# Patient Record
Sex: Male | Born: 1960
Health system: Southern US, Community
[De-identification: ages and names within clinical notes are randomized; demographics above are authoritative.]

## PROBLEM LIST (undated history)

## (undated) DIAGNOSIS — R0609 Other forms of dyspnea: Secondary | ICD-10-CM

## (undated) DIAGNOSIS — L97519 Non-pressure chronic ulcer of other part of right foot with unspecified severity: Secondary | ICD-10-CM

## (undated) DIAGNOSIS — E11621 Type 2 diabetes mellitus with foot ulcer: Secondary | ICD-10-CM

## (undated) DIAGNOSIS — I739 Peripheral vascular disease, unspecified: Secondary | ICD-10-CM

## (undated) DIAGNOSIS — N12 Tubulo-interstitial nephritis, not specified as acute or chronic: Secondary | ICD-10-CM

## (undated) DIAGNOSIS — Z8619 Personal history of other infectious and parasitic diseases: Secondary | ICD-10-CM

## (undated) DIAGNOSIS — E119 Type 2 diabetes mellitus without complications: Secondary | ICD-10-CM

## (undated) DIAGNOSIS — Z9989 Dependence on other enabling machines and devices: Secondary | ICD-10-CM

## (undated) DIAGNOSIS — I8390 Asymptomatic varicose veins of unspecified lower extremity: Secondary | ICD-10-CM

## (undated) DIAGNOSIS — K760 Fatty (change of) liver, not elsewhere classified: Secondary | ICD-10-CM

## (undated) DIAGNOSIS — I2699 Other pulmonary embolism without acute cor pulmonale: Secondary | ICD-10-CM

## (undated) DIAGNOSIS — G4733 Obstructive sleep apnea (adult) (pediatric): Secondary | ICD-10-CM

## (undated) DIAGNOSIS — I82409 Acute embolism and thrombosis of unspecified deep veins of unspecified lower extremity: Secondary | ICD-10-CM

## (undated) DIAGNOSIS — I10 Essential (primary) hypertension: Secondary | ICD-10-CM

## (undated) DIAGNOSIS — I2510 Atherosclerotic heart disease of native coronary artery without angina pectoris: Secondary | ICD-10-CM

## (undated) DIAGNOSIS — K56609 Unspecified intestinal obstruction, unspecified as to partial versus complete obstruction: Secondary | ICD-10-CM

## (undated) DIAGNOSIS — E669 Obesity, unspecified: Secondary | ICD-10-CM

## (undated) HISTORY — DX: Asymptomatic varicose veins of unspecified lower extremity: I83.90

## (undated) HISTORY — DX: Type 2 diabetes mellitus without complications: E11.9

## (undated) HISTORY — DX: Atherosclerotic heart disease of native coronary artery without angina pectoris: I25.10

## (undated) HISTORY — DX: Personal history of other infectious and parasitic diseases: Z86.19

## (undated) HISTORY — PX: HERNIA REPAIR: SHX51

## (undated) NOTE — *Deleted (*Deleted)
Please stop by lab before you go If you have mychart- we will send your results within 3 business days of [place] receiving them.  If you do not have mychart- we will call you about results within 5 business days of [place] receiving them.  *please note we are currently using [facility] which has a longer processing time than [facility] typically so labs may not come back as quickly as in the past *please also note that you will see labs on mychart as soon as they post. I will later go in and write notes on them- will say "notes from Dr. Durene Cal"  Health Maintenance Due  Topic Date Due  . COLONOSCOPY  Never done  . OPHTHALMOLOGY EXAM  04/26/2014  . COVID-19 Vaccine (2 - Pfizer 2-dose series) 11/14/2019  . INFLUENZA VACCINE  12/26/2019   Depression screen [facility] 2/9 12/27/2019 12/09/2019 02/09/2018  Decreased Interest 0 0 2  Down, Depressed, Hopeless 0 0 2  PHQ - 2 Score 0 0 4  Altered sleeping 0 0 2  Tired, decreased energy 0 2 2  Change in appetite 0 0 1  Feeling bad or failure about yourself  0 0 2  Trouble concentrating 0 0 1  Moving slowly or fidgety/restless 0 0 0  Suicidal thoughts 0 0 0  PHQ-9 Score 0 2 12  Difficult doing work/chores Not difficult at all Not difficult at all Not difficult at all

---

## 2009-10-12 ENCOUNTER — Emergency Department (HOSPITAL_COMMUNITY): Admission: EM | Admit: 2009-10-12 | Discharge: 2009-10-12 | Payer: Self-pay | Admitting: Family Medicine

## 2010-08-13 LAB — CBC
HCT: 42.8 % (ref 39.0–52.0)
Hemoglobin: 14.8 g/dL (ref 13.0–17.0)
MCHC: 34.7 g/dL (ref 30.0–36.0)
MCV: 88.2 fL (ref 78.0–100.0)
Platelets: 256 10*3/uL (ref 150–400)
RBC: 4.86 MIL/uL (ref 4.22–5.81)
RDW: 13.8 % (ref 11.5–15.5)
WBC: 13.9 10*3/uL — ABNORMAL HIGH (ref 4.0–10.5)

## 2010-08-13 LAB — DIFFERENTIAL
Basophils Absolute: 0.1 10*3/uL (ref 0.0–0.1)
Basophils Relative: 1 % (ref 0–1)
Eosinophils Absolute: 0.3 10*3/uL (ref 0.0–0.7)
Eosinophils Relative: 2 % (ref 0–5)
Lymphocytes Relative: 15 % (ref 12–46)
Lymphs Abs: 2.1 10*3/uL (ref 0.7–4.0)
Monocytes Absolute: 1.1 10*3/uL — ABNORMAL HIGH (ref 0.1–1.0)
Monocytes Relative: 8 % (ref 3–12)
Neutro Abs: 10.2 10*3/uL — ABNORMAL HIGH (ref 1.7–7.7)
Neutrophils Relative %: 73 % (ref 43–77)

## 2011-10-05 ENCOUNTER — Encounter (HOSPITAL_COMMUNITY): Payer: Self-pay | Admitting: *Deleted

## 2011-10-05 ENCOUNTER — Emergency Department (INDEPENDENT_AMBULATORY_CARE_PROVIDER_SITE_OTHER)
Admission: EM | Admit: 2011-10-05 | Discharge: 2011-10-05 | Disposition: A | Discharge status: Home / Self Care | Payer: PRIVATE HEALTH INSURANCE | Source: Home / Self Care | Attending: Emergency Medicine | Admitting: Emergency Medicine

## 2011-10-05 DIAGNOSIS — K529 Noninfective gastroenteritis and colitis, unspecified: Secondary | ICD-10-CM

## 2011-10-05 DIAGNOSIS — J069 Acute upper respiratory infection, unspecified: Secondary | ICD-10-CM

## 2011-10-05 DIAGNOSIS — K5289 Other specified noninfective gastroenteritis and colitis: Secondary | ICD-10-CM

## 2011-10-05 HISTORY — DX: Obesity, unspecified: E66.9

## 2011-10-05 MED ORDER — ONDANSETRON HCL 4 MG/2ML IJ SOLN
INTRAMUSCULAR | Status: AC
  Filled 2011-10-05: qty 2

## 2011-10-05 MED ORDER — BENZONATATE 200 MG PO CAPS: 200.0000 mg | ORAL_CAPSULE | Freq: Three times a day (TID) | ORAL | Status: AC | PRN

## 2011-10-05 MED ORDER — ONDANSETRON HCL 4 MG/2ML IJ SOLN
4.0000 mg | Freq: Once | INTRAMUSCULAR | Status: AC
  Administered 2011-10-05: 4 mg via INTRAMUSCULAR

## 2011-10-05 MED ORDER — ONDANSETRON 8 MG PO TBDP: 8.0000 mg | ORAL_TABLET | Freq: Three times a day (TID) | ORAL | Status: AC | PRN

## 2011-10-05 NOTE — ED Notes (Signed)
Ginger ale provided.  Instructed on slow, frequent sips.

## 2011-10-05 NOTE — ED Notes (Addendum)
Started not feeling well w/ some weakness Thursday night.  By Friday had a lot of nausea and started w/ a fever.  Denies any pain, just slight body aches.  Denies diarrhea or cold sxs.  Has been taking IBU - last dose last night.  States unable to drink PO fluids due to increase of nausea.  Denies any UTI sxs.

## 2011-10-05 NOTE — ED Provider Notes (Signed)
Chief Complaint  Patient presents with  . Nausea  . Fever    History of Present Illness:   The patient is a 51 year old male who has had a three-day history of nausea and vomiting. He denies any hematemesis, coffee-ground emesis, or bilious emesis. He's had no diarrhea and no bowel pain. He's felt feverish, chills, achy, had some headache. He also notes nasal congestion, clear rhinorrhea, and a cough productive of small amounts of clear sputum and he denies any sore throat, earache, or wheezing.  Review of Systems:  Other than noted above, the patient denies any of the following symptoms: Systemic:  No fevers, chills, sweats, weight loss or gain, fatigue, or tiredness. ENT:  No nasal congestion, rhinorrhea, or sore throat. Lungs:  No cough, wheezing, or shortness of breath. Cardiac:  No chest pain, syncope, or presyncope. GI:  No abdominal pain, nausea, vomiting, anorexia, diarrhea, constipation, blood in stool or vomitus. GU:  No dysuria, frequency, or urgency.  PMFSH:  Past medical history, family history, social history, meds, and allergies were reviewed.  Physical Exam:   Vital signs:  BP 152/83  Pulse 112  Temp(Src) 101.5 F (38.6 C) (Oral)  Resp 26  SpO2 94% General:  Alert and oriented.  In no distress.  Skin warm and dry.  Good skin turgor, brisk capillary refill. ENT:  No scleral icterus, moist mucous membranes, no oral lesions, pharynx clear. Lungs:  Breath sounds clear and equal bilaterally.  No wheezes, rales, or rhonchi. Heart:  Rhythm regular, without extrasystoles.  No gallops or murmers. Abdomen:  Abdomen was soft, flat, and nondistended. There was no tenderness, guarding, rebound. No organomegaly or mass. Bowel sounds were hyperactive. Skin: Clear, warm, and dry.  Good turgor.  Brisk capillary refill.  Course in Urgent Care Center:   He was given Zofran 4 mg IM and tolerated this well without any immediate side effects.  Assessment:  The primary encounter diagnosis  was Gastroenteritis. A diagnosis of Upper respiratory infection was also pertinent to this visit.  Plan:   1.  The following meds were prescribed:   New Prescriptions   BENZONATATE (TESSALON) 200 MG CAPSULE    Take 1 capsule (200 mg total) by mouth 3 (three) times daily as needed for cough.   ONDANSETRON (ZOFRAN ODT) 8 MG DISINTEGRATING TABLET    Take 1 tablet (8 mg total) by mouth every 8 (eight) hours as needed for nausea.   2.  The patient was instructed in symptomatic care and handouts were given. 3.  The patient was told to return if becoming worse in any way, if no better in 2 or 3 days, and given some red flag symptoms that would indicate earlier return. 4.  The patient was told to take only sips of clear liquids for the next 24 hours and then advance to a b.r.a.t. Diet.      Reuben Likes, MD 10/05/11 1739

## 2011-10-05 NOTE — Discharge Instructions (Signed)
You have been diagnosed with gastroenteritis.  This can be caused by a virus or a bacteria.  Viral infections can last from less than a day to a week.  If your symptoms last more than a week, a bacterial infection is more likely.  Either way, you must assume you are contagious and take infectious precautions.  If you work in food preparation, you should stay out of work.  Likewise, you should not prepare food for your family.  Practice frequent hand washing.  Hand sanitizer does not reliably kill the virus.  Wash your hands after you use the bathroom, touch your mouth or face, and before contact with anyone.  Do not kiss anyone and do not let anyone eat or drink after you.  For right now, we recommend taking only clear liquids.  This would include things like Gator Aid or other sports drinks, tea, water, ice chips, clear juices, ginger ale, Seven-Up, Sprite, Pedialyte, jello, clear broth--anything you can see through and applesauce.  You should do this for at least 24 hours, perhaps longer.  We recommend small sips at a time.  Sometimes drinking a large amount will cause you to be nauseated and you will vomit it back up.  Sometimes it helps to have this chilled or drink it over ice chips.  Once your stomach settles down a little, you can advance to a very light diet.  We have a diet called the b.r.a.t. Diet which stands for the following:  Bananas  Rice  Apple sauce (not apple juice)  Toast or crackers.  If diarrhea becomes a problem, you may try Imodeum unless your doctor tells you not to. You can take up to 4 per day or 1 every 6 hours.  Stick with this for about 24 hours, then you may advance to a more regular diet, but your stomach will be sensitive for 5 to 7 days, so it would be a good idea to avoid heavy, greasy, fried, or spicey foods.    You should return if:  You symptoms are not better in 3 days or they have gone on for 7 days total.  You have severe symptoms of high fever or severe  abdominal pain.  You feel you are getting dehydrated with dizziness, weakness, muscle cramps, or severe fatigue.  You have blood in your vomitus or stool.  This includes black discoloration of your vomitus or stool.  But remember that Pepto Bismol can cause black stools.    Most upper respiratory infections are caused by viruses and do not require antibiotics.  We try to save the antibiotics for when we really need them to avoid resistance.  This does not mean that there is nothing that can be done.  Here are a few hints about things that can be done at home to get over an upper respiratory infection quicker:  Get extra sleep and extra fluids.  Get 7 to 9 hours of sleep per night and 6 to 8 glasses of water a day.  Getting extra sleep keeps the immune system from getting run down.  Most people with an upper respiratory infection are a little dehydrated.  The extra fluids also keep the secretions liquified and easier to deal with.  Also, get extra vitamin C.  4000 mg per day is the recommended dose. For the aches, headache, and fever, acetaminophen or ibuprofen are helpful.  These can be alternated every 4 hours.  People with liver disease should avoid large amounts of acetaminophen, and  people with ulcer disease, gastroesophageal reflux, gastritis, congestive heart failure, chronic kidney disease, coronary artery disease and the elderly should avoid ibuprofen. For nasal congestion try Mucinex-D, or if you're having lots of sneezing or copious clear nasal drainage Allegra-D-24 hour.  A Saline nasal spray such as Ocean Spray can also help as can decongestant sprays such as Afrin, but you should not use the decongestant sprays for more than 3 or 4 days since they can be habituating.  If nasal dryness is a problem, Ayr Nasal Gel can help moisturize your nasal passages.  Breath Rite nasal strips can also offer a non-drug alternative treatment to nasal congestion, especially at night. For people with symptoms  of sinusitis, sleeping with your head elevated can be helpful.  For sinus pain, moist, hot compresses to the face may provide some relief.  Many people find that inhaling steam as in a shower or from a pot of steaming water can help. For sore throat, zinc containing lozenges such as Cold-Eze or Zicam are helpful.  Zinc helps to fight infection and has a mild astringent effect that relieves the sore, achey throat.  Hot salt water gargles (8 oz of hot water, 1/2 tsp of table salt, and a pinch of baking soda) can give relief as well as hot beverages such as hot tea. For the cough, old time remedies such as honey or honey and lemon are tried and true.  Over the counter cough syrups such as Delsym 2 tsp every 12 hours can help as well.  It has also been found recently that Aleve can help control a cough.  The dose is 1 to 2 tablets twice daily with food.  This can be combined with Delsym. (Note, if you are taking ibuprofen, you should not take Aleve as well--take one or the other.)  It's important when you have an upper respiratory infection not to pass the infection to others.  This involves being very careful about the following:  Frequent hand washing or use of hand sanitizer, especially after coughing, sneezing, blowing your nose or touching your face, nose or eyes. Do not shake hands or touch anyone and try to avoid touching surfaces that other people use such as doorknobs, shopping carts, telephones and computer keyboards. Use tissues and dispose of them properly in a garbage can or ziplock bag. Cough into your sleeve. Do not let others eat or drink after you.  It's also important to recognize the signs of serious illness and get evaluated if they occur: Any respiratory infection that lasts more than 7 to 10 days.  Yellow nasal drainage and sputum are not reliable indicators of a bacterial infection, but if they last for more than 1 week, see your doctor. Fever and sore throat can indicate  strep. Fever and cough can indicate influenza or pneumonia. Any kind of severe symptom such as difficulty breathing, intractable vomiting, or severe pain should prompt you to see a doctor as soon as possible.   Your body's immune system is really the thing that will get rid of this infection.  Your immune system is comprised of 2 types of specialized cells called T cells and B cells.  T cells coordinate the array of cells in your body that engulf invading bacteria or viruses while B cells orchestrate the production of antibodies that neutralize infection.  Anything we do or any medications we give you, will just strengthen your immune system or help it clear up the infection quicker.  Here are a  few helpful hints to improve your immune system to help overcome this illness or to prevent future infections:  A few vitamins can improve the health of your immune system.  That's why your diet should include plenty of fruits, vegetables, fish, nuts, and whole grains.  Vitamin A and bet-carotene can increase the cells that fight infections (T cells and B cells).  Vitamin A is abundant in dark greens and orange vegetables such as spinach, greens, sweet potatoes, and carrots.  Vitamin B6 contributes to the maturation of white blood cells, the cells that fight disease.  Foods with vitamin B6 include cold cereal and bananas.  Vitamin C is credited with preventing colds because it increases white blood cells and also prevents cellular damage.  Citrus fruits, peaches and green and red bell peppers are all hight in vitamin C.  Vitamin E is an anti-oxidant that encourages the production of natural killer cells which reject foreign invaders and B cells that produce antibodies.  Foods high in vitamin E include wheat germ, nuts and seeds.  Foods high in omega-3 fatty acids found in foods like salmon, tuna and mackerel boost your immune system and help cells to engulf and absorb germs.  Probiotics are good bacteria  that increase your T cells.  These can be found in yogurt and are available in supplements such as Culturelle or Align.  Moderate exercise increases the strength of your immune system and your ability to recover from illness.  I suggest 3 to 5 moderate intensity 30 minute workouts per week.    Sleep is another component of maintaining a strong immune system.  It enables your body to recuperate from the day's activities, stress and work.  My recommendation is to get between 7 and 9 hours of sleep per night.  If you smoke, try to quit completely or at least cut down.  Drink alcohol only in moderation if at all.  No more than 2 drinks daily for men or 1 for women.  Get a flu vaccine early in the fall or if you have not gotten one yet, once this illness has run its course.  If you are over 65, a smoker, or an asthmatic, get a pneumococcal vaccine.  My final recommendation is to maintain a healthy weight.  Excess weight can impair the immune system by interfering with the way the immune system deals with invading viruses or bacteria.

## 2012-05-04 ENCOUNTER — Emergency Department (HOSPITAL_COMMUNITY): Payer: PRIVATE HEALTH INSURANCE

## 2012-05-04 ENCOUNTER — Emergency Department (HOSPITAL_COMMUNITY)
Admission: EM | Admit: 2012-05-04 | Discharge: 2012-05-04 | Disposition: A | Discharge status: Home / Self Care | Payer: PRIVATE HEALTH INSURANCE | Source: Home / Self Care | Attending: Family Medicine | Admitting: Family Medicine

## 2012-05-04 ENCOUNTER — Encounter (HOSPITAL_COMMUNITY): Payer: Self-pay

## 2012-05-04 ENCOUNTER — Inpatient Hospital Stay (HOSPITAL_COMMUNITY)
Admission: EM | Admit: 2012-05-04 | Discharge: 2012-05-07 | DRG: 176 | Disposition: A | Discharge status: Home / Self Care | Payer: PRIVATE HEALTH INSURANCE | Attending: Internal Medicine | Admitting: Internal Medicine

## 2012-05-04 ENCOUNTER — Encounter (HOSPITAL_COMMUNITY): Payer: Self-pay | Admitting: Emergency Medicine

## 2012-05-04 DIAGNOSIS — G4733 Obstructive sleep apnea (adult) (pediatric): Secondary | ICD-10-CM | POA: Diagnosis present

## 2012-05-04 DIAGNOSIS — R0602 Shortness of breath: Secondary | ICD-10-CM

## 2012-05-04 DIAGNOSIS — Z6841 Body Mass Index (BMI) 40.0 and over, adult: Secondary | ICD-10-CM

## 2012-05-04 DIAGNOSIS — I824Y9 Acute embolism and thrombosis of unspecified deep veins of unspecified proximal lower extremity: Secondary | ICD-10-CM | POA: Diagnosis present

## 2012-05-04 DIAGNOSIS — R0600 Dyspnea, unspecified: Secondary | ICD-10-CM

## 2012-05-04 DIAGNOSIS — G4730 Sleep apnea, unspecified: Secondary | ICD-10-CM | POA: Diagnosis present

## 2012-05-04 DIAGNOSIS — R0609 Other forms of dyspnea: Secondary | ICD-10-CM

## 2012-05-04 DIAGNOSIS — I82439 Acute embolism and thrombosis of unspecified popliteal vein: Secondary | ICD-10-CM

## 2012-05-04 DIAGNOSIS — I2699 Other pulmonary embolism without acute cor pulmonale: Principal | ICD-10-CM

## 2012-05-04 DIAGNOSIS — R0989 Other specified symptoms and signs involving the circulatory and respiratory systems: Secondary | ICD-10-CM

## 2012-05-04 DIAGNOSIS — I82409 Acute embolism and thrombosis of unspecified deep veins of unspecified lower extremity: Secondary | ICD-10-CM

## 2012-05-04 DIAGNOSIS — E669 Obesity, unspecified: Secondary | ICD-10-CM

## 2012-05-04 HISTORY — DX: Other forms of dyspnea: R06.09

## 2012-05-04 HISTORY — DX: Dyspnea, unspecified: R06.00

## 2012-05-04 HISTORY — DX: Other pulmonary embolism without acute cor pulmonale: I26.99

## 2012-05-04 HISTORY — DX: Acute embolism and thrombosis of unspecified deep veins of unspecified lower extremity: I82.409

## 2012-05-04 LAB — CBC WITH DIFFERENTIAL/PLATELET
Basophils Absolute: 0.1 10*3/uL (ref 0.0–0.1)
Basophils Relative: 1 % (ref 0–1)
Eosinophils Absolute: 0.4 10*3/uL (ref 0.0–0.7)
Eosinophils Relative: 3 % (ref 0–5)
HCT: 45.4 % (ref 39.0–52.0)
Hemoglobin: 15.4 g/dL (ref 13.0–17.0)
Lymphocytes Relative: 29 % (ref 12–46)
Lymphs Abs: 3.2 10*3/uL (ref 0.7–4.0)
MCH: 30 pg (ref 26.0–34.0)
MCHC: 33.9 g/dL (ref 30.0–36.0)
MCV: 88.3 fL (ref 78.0–100.0)
Monocytes Absolute: 0.9 10*3/uL (ref 0.1–1.0)
Monocytes Relative: 8 % (ref 3–12)
Neutro Abs: 6.5 10*3/uL (ref 1.7–7.7)
Neutrophils Relative %: 59 % (ref 43–77)
Platelets: 190 10*3/uL (ref 150–400)
RBC: 5.14 MIL/uL (ref 4.22–5.81)
RDW: 14 % (ref 11.5–15.5)
WBC: 11.1 10*3/uL — ABNORMAL HIGH (ref 4.0–10.5)

## 2012-05-04 LAB — BASIC METABOLIC PANEL
BUN: 17 mg/dL (ref 6–23)
CO2: 27 mEq/L (ref 19–32)
Calcium: 9.5 mg/dL (ref 8.4–10.5)
Chloride: 104 mEq/L (ref 96–112)
Creatinine, Ser: 1.04 mg/dL (ref 0.50–1.35)
GFR calc Af Amer: >90 mL/min (ref >90)
GFR calc non Af Amer: 81 mL/min — ABNORMAL LOW (ref >90)
Glucose, Bld: 100 mg/dL — ABNORMAL HIGH (ref 70–99)
Potassium: 4.3 mEq/L (ref 3.5–5.1)
Sodium: 141 mEq/L (ref 135–145)

## 2012-05-04 LAB — POCT I-STAT TROPONIN I: Troponin i, poc: 0.01 ng/mL (ref 0.00–0.08)

## 2012-05-04 LAB — PRO B NATRIURETIC PEPTIDE: Pro B Natriuretic peptide (BNP): 216.3 pg/mL — ABNORMAL HIGH (ref 0–125)

## 2012-05-04 LAB — PROTIME-INR
INR: 0.98 (ref 0.00–1.49)
Prothrombin Time: 12.9 seconds (ref 11.6–15.2)

## 2012-05-04 LAB — D-DIMER, QUANTITATIVE: D-Dimer, Quant: 3.82 ug/mL-FEU — ABNORMAL HIGH (ref 0.00–0.48)

## 2012-05-04 LAB — APTT: aPTT: 32 seconds (ref 24–37)

## 2012-05-04 MED ORDER — HEPARIN BOLUS VIA INFUSION
5000.0000 [IU] | Freq: Once | INTRAVENOUS | Status: AC
  Administered 2012-05-04: 5000 [IU] via INTRAVENOUS

## 2012-05-04 MED ORDER — IOHEXOL 350 MG/ML SOLN
100.0000 mL | Freq: Once | INTRAVENOUS | Status: AC | PRN
  Administered 2012-05-04: 100 mL via INTRAVENOUS

## 2012-05-04 MED ORDER — HEPARIN (PORCINE) IN NACL 100-0.45 UNIT/ML-% IJ SOLN
1900.0000 [IU]/h | Freq: Once | INTRAMUSCULAR | Status: AC
  Administered 2012-05-04: 1900 [IU]/h via INTRAVENOUS
  Filled 2012-05-04: qty 250

## 2012-05-04 NOTE — ED Notes (Signed)
Pt sent from [facility] for eval of SOB while in shower and possible swelling in legs; pt denies complaint at present

## 2012-05-04 NOTE — ED Provider Notes (Signed)
History     CSN: [id number]  Arrival date & time 05/04/12  1635   First MD Initiated Contact with Patient 05/04/12 1744      Chief Complaint  Patient presents with  . Shortness of Breath    (Consider location/radiation/quality/duration/timing/severity/associated sxs/prior treatment) Patient is a 51 y.o. male presenting with shortness of breath. The history is provided by the patient.  Shortness of Breath  The current episode started today (episode of dyspnea lasting 5-10 minutes after getting up this am while in shower, never prev experienced, did not recur.). The problem has been resolved. The problem is mild. Associated symptoms include shortness of breath. Pertinent negatives include no chest pain, no cough and no wheezing. He has received no recent medical care (no pcp.).    Past Medical History  Diagnosis Date  . Obesity     History reviewed. No pertinent past surgical history.  History reviewed. No pertinent family history.  History  Substance Use Topics  . Smoking status: Never Smoker   . Smokeless tobacco: Not on file  . Alcohol Use: No      Review of Systems  Constitutional: Negative.   Respiratory: Positive for shortness of breath. Negative for cough, chest tightness and wheezing.   Cardiovascular: Positive for leg swelling. Negative for chest pain and palpitations.    Allergies  Review of patient's allergies indicates no known allergies.  Home Medications  No current outpatient prescriptions on file.  BP 139/78  Pulse 86  Temp 98.8 F (37.1 C) (Oral)  Resp 12  SpO2 100%  Physical Exam  Nursing note and vitals reviewed. Constitutional: He is oriented to person, place, and time. He appears well-developed and well-nourished. No distress.  HENT:  Head: Normocephalic.  Mouth/Throat: Oropharynx is clear and moist.  Eyes: Pupils are equal, round, and reactive to light.  Neck: Normal range of motion. Neck supple.  Cardiovascular: Regular rhythm and  normal heart sounds.  Tachycardia present.  Exam reveals decreased pulses. Exam reveals no gallop.        2+ pedal edema  Pulmonary/Chest: Effort normal and breath sounds normal.  Lymphadenopathy:    He has no cervical adenopathy.  Neurological: He is alert and oriented to person, place, and time.  Skin: Skin is warm and dry.    ED Course  Procedures (including critical care time)  Labs Reviewed - No data to display No results found.   1. Acute dyspnea       MDM          Linna Hoff, MD 05/04/12 1810

## 2012-05-04 NOTE — ED Notes (Addendum)
States he had an episode earlier today while in shower , that he felt he could not get a good breath. Denies pain, nausea with the sensation That sensation went away as the day progressed, and had not return. Came to be checked . Speaking in complete sentences; chest clear

## 2012-05-04 NOTE — ED Provider Notes (Signed)
History     CSN: [id number]  Arrival date & time 05/04/12  1815   First MD Initiated Contact with Patient 05/04/12 2110      Chief Complaint  Patient presents with  . Shortness of Breath    (Consider location/radiation/quality/duration/timing/severity/associated sxs/prior treatment) HPI Comments: 5 min episode of SOB while showering. Self-limiting. No instigating, alleviating/exacerbating factors.  Patient is a 51 y.o. male presenting with shortness of breath. The history is provided by the patient.  Shortness of Breath  The current episode started today. The onset was gradual. Episode frequency: once. The problem has been resolved. The problem is mild. Nothing relieves the symptoms. Nothing aggravates the symptoms. Associated symptoms include shortness of breath. Pertinent negatives include no chest pain, no chest pressure, no fever, no rhinorrhea, no sore throat, no cough and no wheezing. He was not exposed to toxic fumes. He has not inhaled smoke recently. He has had no prior steroid use. He has had no prior hospitalizations. He has had no prior ICU admissions. He has had no prior intubations. His past medical history does not include asthma or asthma in the family.    Past Medical History  Diagnosis Date  . Obesity     History reviewed. No pertinent past surgical history.  History reviewed. No pertinent family history.  History  Substance Use Topics  . Smoking status: Never Smoker   . Smokeless tobacco: Not on file  . Alcohol Use: No      Review of Systems  Constitutional: Negative for fever.  HENT: Negative for sore throat and rhinorrhea.   Respiratory: Positive for shortness of breath. Negative for cough and wheezing.   Cardiovascular: Negative for chest pain.  All other systems reviewed and are negative.    Allergies  Review of patient's allergies indicates no known allergies.  Home Medications  No current outpatient prescriptions on file.  BP 123/84   Pulse 94  Temp 97.1 F (36.2 C) (Oral)  Resp 18  SpO2 98%  Physical Exam  Nursing note and vitals reviewed. Constitutional: He is oriented to person, place, and time. He appears well-developed and well-nourished. No distress.       Morbidly obese  HENT:  Head: Normocephalic and atraumatic.  Mouth/Throat: No oropharyngeal exudate.  Eyes: EOM are normal. Pupils are equal, round, and reactive to light.  Neck: Normal range of motion. Neck supple. No JVD present.  Cardiovascular: Normal rate and regular rhythm.  Exam reveals no friction rub.   No murmur heard. Pulmonary/Chest: Effort normal and breath sounds normal. No respiratory distress. He has no wheezes. He has no rales.  Abdominal: Soft. He exhibits no distension. There is no tenderness. There is no rebound.  Musculoskeletal: Normal range of motion. He exhibits no edema.  Neurological: He is alert and oriented to person, place, and time.  Skin: He is not diaphoretic.    ED Course  Procedures (including critical care time)  Labs Reviewed  CBC WITH DIFFERENTIAL - Abnormal; Notable for the following:    WBC 11.1 (*)     All other components within normal limits  BASIC METABOLIC PANEL - Abnormal; Notable for the following:    Glucose, Bld 100 (*)     GFR calc non Af Amer 81 (*)     All other components within normal limits  PRO B NATRIURETIC PEPTIDE - Abnormal; Notable for the following:    Pro B Natriuretic peptide (BNP) 216.3 (*)     All other components within normal limits  D-DIMER,  QUANTITATIVE - Abnormal; Notable for the following:    D-Dimer, Quant 3.82 (*)     All other components within normal limits   Dg Chest 2 View  05/04/2012  *RADIOLOGY REPORT*  Clinical Data: Left-sided chest pain.  Short of breath.  Bilateral leg swelling.  CHEST - 2 VIEW  Comparison: None.  Findings: Mild basilar atelectasis.  No airspace disease.  No effusion.  Cardiopericardial silhouette within normal limits for projection.  Suboptimal  lateral view because the arms are not fully raised over head.  IMPRESSION: No acute cardiopulmonary disease.  Low volume chest.   Original Report Authenticated By: Andreas Newport, M.D.      1. Pulmonary embolism, bilateral   2. Shortness of breath   .  Date: 05/04/2012  Rate: 84  Rhythm: normal sinus rhythm  QRS Axis: normal  Intervals: normal  ST/T Wave abnormalities: normal  Conduction Disutrbances:none  Narrative Interpretation: difficult study due to habitus and chest hair  Old EKG Reviewed: none available      MDM   51 year old male with no prior medical history presents with shortness of breath episode. Patient had 5-10 minutes and shortness of breath on the shower. No associated chest pain, cough, fever. He would urgent care today and they were concerned about his peripheral edema, which he states has been chronic for quite some time. Labs drawn in triage show elevated d-dimer, mildly elevated BNP.  On exam, patient has 1+ peripheral edema in bilateral lower sugar use. He has clear lungs, no JVD. I will scan his chest to look for possible PE. CT scan with bilateral PE and concern for heart strain. Patient given heparin bolus. Critical care consulted. Critical Care recommended calculating PESI score - which is 81 - and very low risk. Medicine admitting as he has normal vitals, no oxygen requirement, no tachycardia.        Elwin Mocha, MD 05/04/12 2340

## 2012-05-05 ENCOUNTER — Encounter (HOSPITAL_COMMUNITY): Payer: Self-pay | Admitting: General Practice

## 2012-05-05 DIAGNOSIS — I82439 Acute embolism and thrombosis of unspecified popliteal vein: Secondary | ICD-10-CM | POA: Diagnosis present

## 2012-05-05 DIAGNOSIS — I82409 Acute embolism and thrombosis of unspecified deep veins of unspecified lower extremity: Secondary | ICD-10-CM

## 2012-05-05 DIAGNOSIS — G4733 Obstructive sleep apnea (adult) (pediatric): Secondary | ICD-10-CM | POA: Diagnosis present

## 2012-05-05 DIAGNOSIS — I2699 Other pulmonary embolism without acute cor pulmonale: Secondary | ICD-10-CM | POA: Diagnosis present

## 2012-05-05 DIAGNOSIS — Z6841 Body Mass Index (BMI) 40.0 and over, adult: Secondary | ICD-10-CM

## 2012-05-05 DIAGNOSIS — E669 Obesity, unspecified: Secondary | ICD-10-CM

## 2012-05-05 DIAGNOSIS — G4730 Sleep apnea, unspecified: Secondary | ICD-10-CM

## 2012-05-05 DIAGNOSIS — I824Y9 Acute embolism and thrombosis of unspecified deep veins of unspecified proximal lower extremity: Secondary | ICD-10-CM

## 2012-05-05 LAB — CBC
HCT: 43.9 % (ref 39.0–52.0)
Hemoglobin: 15.1 g/dL (ref 13.0–17.0)
MCH: 30.1 pg (ref 26.0–34.0)
MCHC: 34.4 g/dL (ref 30.0–36.0)
MCV: 87.5 fL (ref 78.0–100.0)
Platelets: 169 10*3/uL (ref 150–400)
RBC: 5.02 MIL/uL (ref 4.22–5.81)
RDW: 14.1 % (ref 11.5–15.5)
WBC: 10 10*3/uL (ref 4.0–10.5)

## 2012-05-05 LAB — HOMOCYSTEINE: Homocysteine: 11.6 umol/L (ref 4.0–15.4)

## 2012-05-05 LAB — HEPARIN LEVEL (UNFRACTIONATED)
Heparin Unfractionated: <0.1 IU/mL — ABNORMAL LOW (ref 0.30–0.70)
Heparin Unfractionated: 0.19 IU/mL — ABNORMAL LOW (ref 0.30–0.70)

## 2012-05-05 MED ORDER — SODIUM CHLORIDE 0.9 % IV SOLN
INTRAVENOUS | Status: DC
  Administered 2012-05-05: 02:00:00 via INTRAVENOUS

## 2012-05-05 MED ORDER — SODIUM CHLORIDE 0.9 % IJ SOLN
3.0000 mL | Freq: Two times a day (BID) | INTRAMUSCULAR | Status: DC
  Administered 2012-05-05 – 2012-05-07 (×2): 3 mL via INTRAVENOUS

## 2012-05-05 MED ORDER — WARFARIN VIDEO
Freq: Once | Status: AC
  Administered 2012-05-05: 12:00:00

## 2012-05-05 MED ORDER — HYDROMORPHONE HCL PF 1 MG/ML IJ SOLN: 1.0000 mg | INTRAMUSCULAR | Status: DC | PRN

## 2012-05-05 MED ORDER — HEPARIN (PORCINE) IN NACL 100-0.45 UNIT/ML-% IJ SOLN
2950.0000 [IU]/h | INTRAMUSCULAR | Status: DC
  Administered 2012-05-05: 2600 [IU]/h via INTRAVENOUS
  Administered 2012-05-05: 1900 [IU]/h via INTRAVENOUS
  Administered 2012-05-05: 2300 [IU]/h via INTRAVENOUS
  Administered 2012-05-06 – 2012-05-07 (×2): 2950 [IU]/h via INTRAVENOUS
  Filled 2012-05-05 (×12): qty 250

## 2012-05-05 MED ORDER — HEPARIN BOLUS VIA INFUSION
4000.0000 [IU] | Freq: Once | INTRAVENOUS | Status: AC
  Administered 2012-05-05: 4000 [IU] via INTRAVENOUS
  Filled 2012-05-05: qty 4000

## 2012-05-05 MED ORDER — WARFARIN SODIUM 10 MG PO TABS
10.0000 mg | ORAL_TABLET | Freq: Once | ORAL | Status: AC
  Administered 2012-05-05: 10 mg via ORAL
  Filled 2012-05-05: qty 1

## 2012-05-05 MED ORDER — HEPARIN BOLUS VIA INFUSION
2000.0000 [IU] | Freq: Once | INTRAVENOUS | Status: AC
  Administered 2012-05-05: 2000 [IU] via INTRAVENOUS
  Filled 2012-05-05: qty 2000

## 2012-05-05 MED ORDER — COUMADIN BOOK
Freq: Once | Status: AC
  Administered 2012-05-05: 12:00:00
  Filled 2012-05-05: qty 1

## 2012-05-05 MED ORDER — WARFARIN - PHARMACIST DOSING INPATIENT
Freq: Every day | Status: DC
  Administered 2012-05-05: 18:00:00

## 2012-05-05 NOTE — H&P (Signed)
[facility] History and Physical  Evan Leonard ZOX:[id number] DOB: [birth year]    PCP:   Sheila Oats, MD   Chief Complaint:  Shortness of breath.  HPI: Evan Leonard is an 51 y.o. male with hx of morbid obesity, likely sleep apnea though never been tested, on no chronic medication, presents to the ER as he noted DOE.  He states his symptoms started a couple of days ago, but today it was more severe.  He denied any fever, chills, or even pleuritic chest pain.  He has no leg swelling, long trip, recent surgery, testosterone use, trauma, or any family history of blood clots.  Evaluation in the ER included an EKG with NSR, no acute ST-T changes, normal renal fx test, WBC of 11K, and Hb of 14 grams per DL.  An CTPA showed bilateral pulmonary embolism with right heart strain.  He is not hypoxemia nor hypotensive.  Hospitalist was asked to admit him for acute PE.  Rewiew of Systems:  Constitutional: Negative for malaise, fever and chills. No significant weight loss or weight gain Eyes: Negative for eye pain, redness and discharge, diplopia, visual changes, or flashes of light. ENMT: Negative for ear pain, hoarseness, nasal congestion, sinus pressure and sore throat. No headaches; tinnitus, drooling, or problem swallowing. Cardiovascular: Negative for chest pain, palpitations, diaphoresis,  and peripheral edema. ; No orthopnea, PND Respiratory: Negative for cough, hemoptysis, wheezing and stridor. No pleuritic chestpain. Gastrointestinal: Negative for nausea, vomiting, diarrhea, constipation, abdominal pain, melena, blood in stool, hematemesis, jaundice and rectal bleeding.    Genitourinary: Negative for frequency, dysuria, incontinence,flank pain and hematuria; Musculoskeletal: Negative for back pain and neck pain. Negative for swelling and trauma.;  Skin: . Negative for pruritus, rash, abrasions, bruising and skin lesion.; ulcerations Neuro: Negative for headache, lightheadedness and neck  stiffness. Negative for weakness, altered level of consciousness , altered mental status, extremity weakness, burning feet, involuntary movement, seizure and syncope.  Psych: negative for anxiety, depression, insomnia, tearfulness, panic attacks, hallucinations, paranoia, suicidal or homicidal ideation.   Past Medical History  Diagnosis Date  . Obesity   . Pulmonary embolism 05/04/2012    bilaterally  . Exertional dyspnea 05/04/2012    "isolated episode" (05/05/2012)  . Sleep apnea     "pretty sure; never been tested" (05/05/2012)    Past Surgical History  Procedure Date  . No past surgeries     Medications:  HOME MEDS: Prior to Admission medications   Not on File     Allergies:  No Known Allergies  Social History:   reports that he has never smoked. He has never used smokeless tobacco. He reports that he does not drink alcohol or use illicit drugs.  Family History: History reviewed. No pertinent family history.   Physical Exam: Filed Vitals:   05/04/12 2330 05/05/12 0030 05/05/12 0138 05/05/12 0154  BP: 117/59 121/65  128/86  Pulse: 87 88  84  Temp:    99 F (37.2 C)  TempSrc:    Oral  Resp: 20 17  20   Height:   6\' 2"  (1.88 m)   Weight:   147.4 kg (324 lb 15.3 oz)   SpO2: 98% 95%  99%   Blood pressure 128/86, pulse 84, temperature 99 F (37.2 C), temperature source Oral, resp. rate 20, height 6\' 2"  (1.88 m), weight 147.4 kg (324 lb 15.3 oz), SpO2 99.00%.  GEN:  Pleasant patient lying in the stretcher in no acute distress; cooperative with exam. PSYCH:  alert and oriented x4; does not  appear anxious or depressed; affect is appropriate. HEENT: Mucous membranes pink and anicteric; PERRLA; EOM intact; no cervical lymphadenopathy nor thyromegaly or carotid bruit; no JVD; There were no stridor. Neck is very supple. Breasts:: Not examined CHEST WALL: No tenderness CHEST: Normal respiration, clear to auscultation bilaterally.  HEART: Regular rate and rhythm.  There  are no murmur, rub, or gallops.   BACK: No kyphosis or scoliosis; no CVA tenderness ABDOMEN: soft and non-tender; no masses, no organomegaly, normal abdominal bowel sounds; no pannus; no intertriginous candida. There is no rebound and no distention. Rectal Exam: Not done EXTREMITIES: No bone or joint deformity; age-appropriate arthropathy of the hands and knees; bilateral lower edema; no ulcerations.  There is no calf tenderness. He has a healed ulcer on his right lower extremity with no evidence of cellulitis. Genitalia: not examined PULSES: 2+ and symmetric SKIN: Normal hydration no rash or ulceration CNS: Cranial nerves 2-12 grossly intact no focal lateralizing neurologic deficit.  Speech is fluent; uvula elevated with phonation, facial symmetry and tongue midline. DTR are normal bilaterally, cerebella exam is intact, barbinski is negative and strengths are equaled bilaterally.  No sensory loss.   Labs on Admission:  Basic Metabolic Panel:  Lab 05/04/12 8295  NA 141  K 4.3  CL 104  CO2 27  GLUCOSE 100*  BUN 17  CREATININE 1.04  CALCIUM 9.5  MG --  PHOS --   Liver Function Tests: No results found for this basename: AST:5,ALT:5,ALKPHOS:5,BILITOT:5,PROT:5,ALBUMIN:5 in the last 168 hours No results found for this basename: LIPASE:5,AMYLASE:5 in the last 168 hours No results found for this basename: AMMONIA:5 in the last 168 hours CBC:  Lab 05/04/12 1820  WBC 11.1*  NEUTROABS 6.5  HGB 15.4  HCT 45.4  MCV 88.3  PLT 190   Cardiac Enzymes: No results found for this basename: CKTOTAL:5,CKMB:5,CKMBINDEX:5,TROPONINI:5 in the last 168 hours  CBG: No results found for this basename: GLUCAP:5 in the last 168 hours   Radiological Exams on Admission: Dg Chest 2 View  05/04/2012  *RADIOLOGY REPORT*  Clinical Data: Left-sided chest pain.  Short of breath.  Bilateral leg swelling.  CHEST - 2 VIEW  Comparison: None.  Findings: Mild basilar atelectasis.  No airspace disease.  No  effusion.  Cardiopericardial silhouette within normal limits for projection.  Suboptimal lateral view because the arms are not fully raised over head.  IMPRESSION: No acute cardiopulmonary disease.  Low volume chest.   Original Report Authenticated By: Andreas Newport, M.D.    Ct Angio Chest Pe W/cm &/or Wo Cm  05/04/2012  *RADIOLOGY REPORT*  Clinical Data: Short of breath.  Elevated D-dimer.  CT ANGIOGRAPHY CHEST  Technique:  Multidetector CT imaging of the chest using the standard protocol during bolus administration of intravenous contrast. Multiplanar reconstructed images including MIPs were obtained and reviewed to evaluate the vascular anatomy.  Contrast: OMNIPAQUE IOHEXOL 350 MG/ML SOLN  Comparison: Chest radiograph 05/04/2012.  Findings: Bilateral pulmonary emboli are present.  There is no saddle embolus.  Near occlusive lower lobe pulmonary emboli are present.  There is straightening of the intraventricular septum of the heart, compatible with right heart strain.  There is no pulmonary infarct.  There is some mild subpleural ground-glass attenuation in the anterior right upper lobe which is nonspecific. Given the markedly low lung volumes, this probably represents a small area of atelectasis.  Pneumonia is possible.  Pulmonary infarction is possible but considered less likely.  Incidental imaging the upper abdomen is within normal limits.  No  effusion. No adenopathy. No aggressive osseous lesions.  Thoracic spondylosis is present.  IMPRESSION: 1.  Technically adequate study which is positive for pulmonary emboli to both the left and right pulmonary arteries.   No convincing evidence of pulmonary infarction. 2.  Straightening of the interventricular septum compatible with right heart strain. 3. Critical Value/emergent results were called by telephone at the time of interpretation on 05/04/2012 at 2248 hours to Dr.RANCOUR, , who verbally acknowledged these results.   Original Report Authenticated By:  Andreas Newport, M.D.     EKG: Independently reviewed. See above.   Assessment/Plan Present on Admission:  . Pulmonary embolism . Obesity . Sleep apnea  PLAN:  Will admit him for tx of acute unprovoked PE.  He was started on IV Heparin, and I discussed Xarelto versus Coumadin, but I will defer to the day team to decide with him tomorrow.  I have not gotten the thrombophilic work up, but will defer later during the non acute phase.  His negative troponin implies good prognosis.  He will need to get his polysomnogram at some point and be treated aggressively for it if positive. Ultrasound of both legs to exclude concomitant DVTs have been ordered as well.  He is stable, full code, and will be admitted OBS/telemetry under [facility] service.    Other plans as per orders.  Code Status: FULL Evan Lightning, MD. [facility] Pager [phone] 7pm to 7am.  05/05/2012, 2:53 AM

## 2012-05-05 NOTE — Progress Notes (Signed)
ANTICOAGULATION CONSULT NOTE - Follow Up Consult  Pharmacy Consult for heparin Indication: pulmonary embolus  Labs:  Basename 05/05/12 1601 05/05/12 0540 05/05/12 0525 05/04/12 1820  HGB -- 15.1 -- 15.4  HCT -- 43.9 -- 45.4  PLT -- 169 -- 190  APTT -- -- -- 32  LABPROT -- -- -- 12.9  INR -- -- -- 0.98  HEPARINUNFRC 0.19* -- <0.10* --  CREATININE -- -- -- 1.04  CKTOTAL -- -- -- --  CKMB -- -- -- --  TROPONINI -- -- -- --    Assessment: 51yo male with sub-therapeutic level on  heparin with initial dosing for PE  Goal of Therapy:  Heparin level 0.3-0.7 units/ml   Plan:  1) Heparin 2000 units iv bolus x 1 2) Increase heparin drip to 2600 units / hr 3) 6 hour heparin level  Thank you. Okey Regal, PharmD [phone]

## 2012-05-05 NOTE — ED Provider Notes (Signed)
I saw and evaluated the patient, reviewed the resident's note and I agree with the findings and plan.  Episode of SOB while in shower, lasted 15 minutes.  No CP, cough, fever.  Leg swelling at baseline. Lungs clear, EKG nonsichemic.  R/o PE.    Glynn Octave, MD 05/05/12 [phone]

## 2012-05-05 NOTE — Progress Notes (Signed)
ANTICOAGULATION CONSULT NOTE - Follow Up Consult  Pharmacy Consult for heparin, coumadin Indication: pulmonary embolus  Labs:  Basename 05/05/12 0540 05/05/12 0525 05/04/12 1820  HGB 15.1 -- 15.4  HCT 43.9 -- 45.4  PLT 169 -- 190  APTT -- -- 32  LABPROT -- -- 12.9  INR -- -- 0.98  HEPARINUNFRC -- <0.10* --  CREATININE -- -- 1.04  CKTOTAL -- -- --  CKMB -- -- --  TROPONINI -- -- --    Assessment: 51yo male  on heparin for PE/DVT and to start coumadin today (baseline INR=1.04)  Goal of Therapy:  Heparin level 0.3-0.7 units/ml INR= 2-3   Plan:  -Coumadin 10mg  po today -Daily PT/INR -Will begin education process.  Harland German, Pharm D 05/05/2012 11:31 AM

## 2012-05-05 NOTE — Progress Notes (Signed)
ANTICOAGULATION CONSULT NOTE - Initial Consult  Pharmacy Consult for heparin  Indication: pulmonary embolus  No Known Allergies  Patient Measurements: Height: 6\' 2"  (188 cm) Weight: 324 lb 15.3 oz (147.4 kg) IBW/kg (Calculated) : 82.2  Heparin Dosing Weight:   Vital Signs: Temp: 97.1 F (36.2 C) (12/09 1820) Temp src: Oral (12/09 1820) BP: 121/65 mmHg (12/10 0030) Pulse Rate: 88  (12/10 0030)  Labs:  Basename 05/04/12 1820  HGB 15.4  HCT 45.4  PLT 190  APTT 32  LABPROT 12.9  INR 0.98  HEPARINUNFRC --  CREATININE 1.04  CKTOTAL --  CKMB --  TROPONINI --    Estimated Creatinine Clearance: 127.3 ml/min (by C-G formula based on Cr of 1.04).   Medical History: Past Medical History  Diagnosis Date  . Obesity   . Pulmonary embolism 05/04/2012    bilaterally  . Exertional dyspnea 05/04/2012    "isolated episode" (05/05/2012)  . Sleep apnea     "pretty sure; never been tested" (05/05/2012)    Medications:  No prescriptions prior to admission    Assessment: 51 yo male with PE of both pulmonary arteries. No PMH or medications.  Goal of Therapy:  Heparin level 0.3-0.7 units/ml Monitor platelets by anticoagulation protocol: Yes   Plan:  Continue heparin at 1900 units/hr and check HL around am labs (~0500). This will be slightly early but anticipating a low level due to body habitus and dose of 13 units/kg of actual body weight.  Daily HL and cbc starting tomorrow.   Janice Coffin 05/05/2012,1:42 AM

## 2012-05-05 NOTE — ED Notes (Signed)
Attempted to call report x2, will call back

## 2012-05-05 NOTE — Care Management Note (Signed)
    Page 1 of 1   05/07/2012     2:46:29 PM   CARE MANAGEMENT NOTE 05/07/2012  Patient:  PHU, RECORD   Account Number:  [id number]  Date Initiated:  05/05/2012  Documentation initiated by:  Wasil Wolke  Subjective/Objective Assessment:   PT ADM WITH SOB, BILATERAL PULMONARY EMBOLI.  PTA, PT INDEPENDENT, LIVES ALONE.     Action/Plan:   CM REFERRAL TO ASSIST WITH OBTAINING PCP.  PT GIVEN NUMBER FOR [facility].  HE STATES HE WILL CALL TO REFERRAL.   Anticipated DC Date:  05/08/2012   Anticipated DC Plan:  HOME/SELF CARE      DC Planning Services  CM consult  Medication Assistance      Choice offered to / List presented to:             Status of service:  Completed, signed off Medicare Important Message given?   (If response is "NO", the following Medicare IM given date fields will be blank) Date Medicare IM given:   Date Additional Medicare IM given:    Discharge Disposition:  HOME/SELF CARE  Per UR Regulation:  Reviewed for med. necessity/level of care/duration of stay  If discussed at Long Length of Stay Meetings, dates discussed:    Comments:  05/07/12 Nickie Retort, BSN [phone] PT FOR DC HOME TODAY ON [place].  PT GIVEN [place] CAREPATH SAVINGS CARD, AND ASSISTED PT WITH ON-LINE ACTIVATION. PREAUTHORIZATION REQUIRED FOR Carlena Hurl; COMPLETED VIA PHONE WITH [facility], PHONE # [phone].

## 2012-05-05 NOTE — Progress Notes (Signed)
VASCULAR LAB PRELIMINARY  PRELIMINARY  PRELIMINARY  PRELIMINARY  Bilateral lower extremity venous duplex  completed.    Preliminary report:  Right:  No evidence of DVT, superficial thrombosis, or Baker's cyst.  Left: DVT noted in the distal popliteal vein.  No evidence of superficial thrombosis.  No Baker's cyst.   Forrest Demuro, RVT 05/05/2012, 11:17 AM

## 2012-05-05 NOTE — Progress Notes (Signed)
ANTICOAGULATION CONSULT NOTE - Follow Up Consult  Pharmacy Consult for heparin Indication: pulmonary embolus  Labs:  [facility] 05/05/12 0525 05/04/12 1820  HGB -- 15.4  HCT -- 45.4  PLT -- 190  APTT -- 32  LABPROT -- 12.9  INR -- 0.98  HEPARINUNFRC <0.10* --  CREATININE -- 1.04  CKTOTAL -- --  CKMB -- --  TROPONINI -- --    Assessment: 51yo male undetectable on heparin with initial dosing for PE; however, per RN pt discovered that the IV line had fallen out while sleeping so unclear how long IV had been off prior to labs drawn.  Goal of Therapy:  Heparin level 0.3-0.7 units/ml   Plan:  When new IV line available will rebolus with 4000 units and increase gtt to 2300 units/hr given +BPE and large size to help ensure not underdosed and check HL 6hr after resumed.  Colleen Can PharmD BCPS 05/05/2012,6:45 AM

## 2012-05-05 NOTE — Progress Notes (Signed)
  Echocardiogram 2D Echocardiogram has been performed.  Evan Leonard 05/05/2012, 6:23 PM

## 2012-05-05 NOTE — ED Notes (Signed)
Pt will be transported to floor once admission orders are entered.

## 2012-05-05 NOTE — Progress Notes (Deleted)
[facility] Progress Note [place] TEAM 1 - Stepdown/ICU Drevon Plog ZOX:[id number] DOB: 1961/05/17 DOA: 05/04/2012 PCP: Sheila Oats, MD  Brief narrative: 51 y.o. male with hx of morbid obesity, likely sleep apnea though never been tested, on no chronic medication, presented to the ER as he noted DOE. He stated his symptoms started a couple of days ago, but on date of admission the symptoms were more severe. He denied any fever, chills, or even pleuritic chest pain. He had no leg swelling, recent long trip, recent surgery, testosterone use, trauma, or any family history of blood clots. Evaluation in the ER included an EKG with NSR, no acute ST-T changes, normal renal fx test, WBC of 11K, and Hb of 14 grams per DL. An CTPA showed bilateral pulmonary embolism with right heart strain. He was not hypoxemia nor hypotensive. Was subsequently admitted to the step down unit.   Assessment/Plan: Principal Problem:  *Pulmonary embolism *Initial occurrence and not hypoxic but was having dyspnea on *CT angiogram question right heart strain which could be chronic given suspicious for underlying sleep apnea but we'll go ahead and pursue echocardiogram. Troponins have been negative *Continue IV heparin and ask pharmacy to dose Coumadin since patient prefers this medication over Xarelto *We'll check a limited hypercoagulable panel but likely after 6 months of treatment patient will need formal evaluation by hematologist as an outpatient  Active Problems:  DVT of left distal popliteal vein *Anticoagulation as above-suspect obesity contributing do to likely chronic venous insufficiency   Sleep apnea *Does not have a formal diagnosis and wants establish his primary care physician can be screened in regards to pursuing outpatient polysomnogram * while here we'll do a nocturnal trending pulse oximetry study   Morbid obesity with body mass index of 40.0-44.9 in adult   DVT prophylaxis: on  full dose heparin  Code Status:  full Family Communication:  spoke with patient Disposition Plan:  remain inpatient on telemetry  Consultants:  none  Procedures:  none  Antibiotics:  none  HPI/Subjective:  currently alert and seated on the side of the bed without any complaints. Multiple questions answered regarding anticoagulation choices for an outpatient   Objective: Blood pressure 128/80, pulse 86, temperature 98.9 F (37.2 C), temperature source Oral, resp. rate 18, height 6\' 2"  (1.88 m), weight 147.4 kg (324 lb 15.3 oz), SpO2 98.00%.  Intake/Output Summary (Last 24 hours) at 05/05/12 1201 Last data filed at 05/05/12 0730  Gross per 24 hour  Intake    240 ml  Output      0 ml  Net    240 ml     Exam: **Followup exam completed  General: N/A Lungs: N/A Cardiovascular: N/A Abdomen: N/A Extremities: N/A  Data Reviewed: Basic Metabolic Panel:  Lab 05/04/12 8119  NA 141  K 4.3  CL 104  CO2 27  GLUCOSE 100*  BUN 17  CREATININE 1.04  CALCIUM 9.5  MG --  PHOS --   Liver Function Tests: No results found for this basename: AST:5,ALT:5,ALKPHOS:5,BILITOT:5,PROT:5,ALBUMIN:5 in the last 168 hours No results found for this basename: LIPASE:5,AMYLASE:5 in the last 168 hours No results found for this basename: AMMONIA:5 in the last 168 hours CBC:  Lab 05/05/12 0540 05/04/12 1820  WBC 10.0 11.1*  NEUTROABS -- 6.5  HGB 15.1 15.4  HCT 43.9 45.4  MCV 87.5 88.3  PLT 169 190   Cardiac Enzymes: No results found for this basename: CKTOTAL:5,CKMB:5,CKMBINDEX:5,TROPONINI:5 in the last 168 hours BNP (last 3 results)  Basename 05/04/12 1820  PROBNP 216.3*   CBG: No results found for this basename: GLUCAP:5 in the last 168 hours  No results found for this or any previous visit (from the past 240 hour(s)).   Studies:  Recent x-ray studies have been reviewed in detail by the Attending Physician  Scheduled Meds:  Reviewed in detail by the Attending  Physician   Junious Silk, ANP [facility] Office  [phone] Pager [phone]  On-Call/Text Page:      [email or web address]      password [email or web address]  If 7PM-7AM, please contact night-coverage [email or web address] Password [email or web address] 05/05/2012, 12:01 PM   LOS: 1 day

## 2012-05-05 NOTE — Progress Notes (Signed)
[facility] Progress Note [place] TEAM 1 - Stepdown/ICU Evan Leonard QMV:[id number] DOB: Jan 21, 1961 DOA: 05/04/2012 PCP: Sheila Oats, MD  Brief narrative: 51 y.o. male with hx of morbid obesity, likely sleep apnea though never been tested, on no chronic medication, presented to the ER as he noted DOE. He stated his symptoms started a couple of days ago, but on date of admission the symptoms were more severe. He denied any fever, chills, or even pleuritic chest pain. He had no leg swelling, recent long trip, recent surgery, testosterone use, trauma, or any family history of blood clots. Evaluation in the ER included an EKG with NSR, no acute ST-T changes, normal renal fx test, WBC of 11K, and Hb of 14 grams per DL. An CTPA showed bilateral pulmonary embolism with right heart strain. He was not hypoxemia nor hypotensive. Was subsequently admitted to the step down unit.   Assessment/Plan: Principal Problem:  *Pulmonary embolism *Initial occurrence and not hypoxic but was having dyspnea on admission *CT angiogram question right heart strain which could be chronic given suspicious for underlying sleep apnea but we'll go ahead and pursue echocardiogram. Troponins have been negative *Continue IV heparin and ask pharmacy to dose Coumadin since patient prefers this medication over Xarelto *We'll check a limited hypercoagulable panel but likely after 6 months of treatment patient will need formal evaluation by hematologist as an outpatient  Active Problems:  DVT of left distal popliteal vein *Anticoagulation as above-suspect obesity contributing    Sleep apnea *Does not have a formal diagnosis and wants establish his primary care physician- can be screened in regards to pursuing outpatient polysomnogram * while here we'll do a nocturnal trending pulse oximetry study   Morbid obesity with body mass index of 40.0-44.9 in adult   DVT prophylaxis: on full dose heparin  Code  Status:  full Family Communication:  spoke with patient Disposition Plan:  remain inpatient on telemetry  Consultants:  none  Procedures:  none  Antibiotics:  none  HPI/Subjective:  currently alert and seated on the side of the bed without any complaints. Multiple questions answered regarding anticoagulation choices for an outpatient   Objective: Blood pressure 129/79, pulse 86, temperature 98.6 F (37 C), temperature source Oral, resp. rate 18, height 6\' 2"  (1.88 m), weight 147.4 kg (324 lb 15.3 oz), SpO2 98.00%.  Intake/Output Summary (Last 24 hours) at 05/05/12 1703 Last data filed at 05/05/12 1230  Gross per 24 hour  Intake    600 ml  Output      0 ml  Net    600 ml     Exam: **Followup exam completed  General: N/A Lungs: N/A Cardiovascular: N/A Abdomen: N/A Extremities: N/A  Data Reviewed: Basic Metabolic Panel:  Lab 05/04/12 2841  NA 141  K 4.3  CL 104  CO2 27  GLUCOSE 100*  BUN 17  CREATININE 1.04  CALCIUM 9.5  MG --  PHOS --   Liver Function Tests: No results found for this basename: AST:5,ALT:5,ALKPHOS:5,BILITOT:5,PROT:5,ALBUMIN:5 in the last 168 hours No results found for this basename: LIPASE:5,AMYLASE:5 in the last 168 hours No results found for this basename: AMMONIA:5 in the last 168 hours CBC:  Lab 05/05/12 0540 05/04/12 1820  WBC 10.0 11.1*  NEUTROABS -- 6.5  HGB 15.1 15.4  HCT 43.9 45.4  MCV 87.5 88.3  PLT 169 190   Cardiac Enzymes: No results found for this basename: CKTOTAL:5,CKMB:5,CKMBINDEX:5,TROPONINI:5 in the last 168 hours BNP (last 3 results)  Basename 05/04/12 1820  PROBNP 216.3*   CBG: No results found for this basename: GLUCAP:5 in the last 168 hours  No results found for this or any previous visit (from the past 240 hour(s)).   Studies:  Recent x-ray studies have been reviewed in detail by the Attending Physician  Scheduled Meds:  Reviewed in detail by the Attending Physician   Junious Silk,  ANP [facility] Office  [phone] Pager [phone]  On-Call/Text Page:      [email or web address]      password [email or web address]  If 7PM-7AM, please contact night-coverage [email or web address] Password [email or web address] 05/05/2012, 5:03 PM   LOS: 1 day   I have examined the patient, reviewed the chart and modified the above note which I agree with.   Calvert Cantor, MD [phone]

## 2012-05-05 NOTE — ED Notes (Signed)
Attempted to call report x1, asked to call back 

## 2012-05-06 LAB — CBC
HCT: 42.4 % (ref 39.0–52.0)
Hemoglobin: 14.2 g/dL (ref 13.0–17.0)
MCH: 29.4 pg (ref 26.0–34.0)
MCHC: 33.5 g/dL (ref 30.0–36.0)
MCV: 87.8 fL (ref 78.0–100.0)
Platelets: 166 10*3/uL (ref 150–400)
RBC: 4.83 MIL/uL (ref 4.22–5.81)
RDW: 14 % (ref 11.5–15.5)
WBC: 7.4 10*3/uL (ref 4.0–10.5)

## 2012-05-06 LAB — HEPARIN LEVEL (UNFRACTIONATED)
Heparin Unfractionated: 0.33 IU/mL (ref 0.30–0.70)
Heparin Unfractionated: 0.37 IU/mL (ref 0.30–0.70)

## 2012-05-06 LAB — LUPUS ANTICOAGULANT PANEL
DRVVT: 37.3 secs (ref <42.9)
Lupus Anticoagulant: NOT DETECTED
PTT Lupus Anticoagulant: 48.9 secs — ABNORMAL HIGH (ref 28.0–43.0)
PTTLA 4:1 Mix: 53.3 secs — ABNORMAL HIGH (ref 28.0–43.0)
PTTLA Confirmation: 2.1 secs (ref <8.0)

## 2012-05-06 LAB — PROTIME-INR
INR: 1.15 (ref 0.00–1.49)
Prothrombin Time: 14.5 seconds (ref 11.6–15.2)

## 2012-05-06 LAB — FACTOR 5 LEIDEN

## 2012-05-06 MED ORDER — WARFARIN SODIUM 10 MG PO TABS
10.0000 mg | ORAL_TABLET | Freq: Once | ORAL | Status: AC
  Administered 2012-05-06: 10 mg via ORAL
  Filled 2012-05-06: qty 1

## 2012-05-06 NOTE — Progress Notes (Addendum)
[facility] Progress Note    Evan Leonard ZOX:[id number] DOB: 1960-07-15 DOA: 05/04/2012 PCP: Sheila Oats, MD  Brief narrative: 52 y.o. male with hx of morbid obesity, likely sleep apnea though never been tested, on no chronic medication, presented to the ER as he noted DOE. He stated his symptoms started a couple of days ago, but on date of admission the symptoms were more severe. He denied any fever, chills, or even pleuritic chest pain. He had no leg swelling, recent long trip, recent surgery, testosterone use, trauma, or any family history of blood clots. Evaluation in the ER included an EKG with NSR, no acute ST-T changes, normal renal fx test, WBC of 11K, and Hb of 14 grams per DL. An CTA chest showed bilateral pulmonary embolism with right heart strain. He was not hypoxemia nor hypotensive. Was subsequently admitted to the step down unit.   Assessment/Plan:   Pulmonary embolism  Initial occurrence and not hypoxic but was having dyspnea on admission  CT angiogram question right heart strain which could be chronic given suspicious for underlying sleep apnea.Troponins have been negative. Echo shows dilated right ventricle but without decrease in function-may be chronic.  Continue IV heparin and Coumadin per pharmacy. Patient was hesitant to start Xarelto earlier but wishes to think over it overnight before making a final decision.  A limited hypercoagulable panel was sent-lupus anticoagulant, beta 2 glycoprotein, factor V Leyden, cardiolipin antibodies are pending. Homocystine level is normal.  At some point in the future, will need formal evaluation by hematologist as an outpatient.   DVT of left distal popliteal vein  Anticoagulation as above.    Sleep apnea  Does not have a formal diagnosis and wants establish his primary care physician- can be screened in regards to pursuing outpatient polysomnogram  while here we'll do a nocturnal trending pulse oximetry study-O2  sats at night have been in the high 90s.    Morbid obesity with body mass index of 40.0-44.9 in adult   DVT prophylaxis: on full dose heparin  Code Status:  full Family Communication:  spoke with patient Disposition Plan:  Home in medically stable  Consultants:  none  Procedures:  none  Antibiotics:  none  HPI/Subjective: Denies complaints. No dyspnea, chest pain or palpitations. No left leg pain.   Objective: Blood pressure 130/70, pulse 75, temperature 97 F (36.1 C), temperature source Oral, resp. rate 18, height 6\' 2"  (1.88 m), weight 147.4 kg (324 lb 15.3 oz), SpO2 99.00%.  Intake/Output Summary (Last 24 hours) at 05/06/12 1526 Last data filed at 05/06/12 1230  Gross per 24 hour  Intake    600 ml  Output      0 ml  Net    600 ml     Exam:   General: Comfortable. Lungs: Clear to auscultation. No increased work of breathing. Cardiovascular: First and second heart sounds heard, regularly regular. No JVD, murmurs and trace bilateral ankle edema. Telemetry shows normal sinus rhythm. Abdomen: Nondistended, soft and nontender. Normal bowel sounds heard. Extremities: Symmetric 5 x 5 power. CNS: Alert and oriented. No focal neurological deficits.  Data Reviewed: Basic Metabolic Panel:  Lab 05/04/12 8119  NA 141  K 4.3  CL 104  CO2 27  GLUCOSE 100*  BUN 17  CREATININE 1.04  CALCIUM 9.5  MG --  PHOS --   Liver Function Tests: No results found for this basename: AST:5,ALT:5,ALKPHOS:5,BILITOT:5,PROT:5,ALBUMIN:5 in the last 168 hours No results found for this basename: LIPASE:5,AMYLASE:5 in the last 168 hours No  results found for this basename: AMMONIA:5 in the last 168 hours CBC:  Lab 05/06/12 0725 05/05/12 0540 05/04/12 1820  WBC 7.4 10.0 11.1*  NEUTROABS -- -- 6.5  HGB 14.2 15.1 15.4  HCT 42.4 43.9 45.4  MCV 87.8 87.5 88.3  PLT 166 169 190   Cardiac Enzymes: No results found for this basename: CKTOTAL:5,CKMB:5,CKMBINDEX:5,TROPONINI:5 in the last  168 hours BNP (last 3 results)  Basename 05/04/12 1820  PROBNP 216.3*   CBG: No results found for this basename: GLUCAP:5 in the last 168 hours  No results found for this or any previous visit (from the past 240 hour(s)).   Studies:  2 D Echo Study Conclusions  - Left ventricle: The cavity size was normal. Wall thickness was normal. Systolic function was normal. The estimated ejection fraction was in the range of 60% to 65%. Although no diagnostic regional wall motion abnormality was identified, this possibility cannot be completely excluded on the basis of this study. Doppler parameters are consistent with abnormal left ventricular relaxation (grade 1 diastolic dysfunction). - Ventricular septum: Septal motion showed "bounce". - Mitral valve: Mildly calcified annulus. - Right ventricle: The cavity size was moderately dilated. Wall thickness was normal. - Atrial septum: No defect or patent foramen ovale was identified.  Impressions:  - Right ventricular dilation without decreased function. PA pressures were not able to be extimated due to poor TR Jet velocity envelopes and inablity to visualize the IVC.  Lower extremity venous dopplers  Summary:  - No evidence of deep vein thrombosis involving the right lower extremity. - Findings consistent with deep vein thrombosis involving the left popliteal vein.  CTA Chest  IMPRESSION:  1. Technically adequate study which is positive for pulmonary emboli to both the left and right pulmonary arteries. No  convincing evidence of pulmonary infarction.  2. Straightening of the interventricular septum compatible with right heart strain.   CXR  IMPRESSION:  No acute cardiopulmonary disease. Low volume chest.   Scheduled Meds:  Reviewed in detail by the Attending Physician   [facility] 3:41 PM Pager: [phone] If 7PM-7AM, please contact night-coverage [email or web address] Password [email or web address] 05/06/2012, 3:26 PM   LOS: 2 days

## 2012-05-06 NOTE — Progress Notes (Signed)
ANTICOAGULATION CONSULT NOTE - Follow Up Consult  Pharmacy Consult for Heparin and Coumadin Indication: bilateral pulmonary embolus and DVT, left leg  No Known Allergies  Patient Measurements: Height: 6\' 2"  (188 cm) Weight: 324 lb 15.3 oz (147.4 kg) IBW/kg (Calculated) : 82.2  Heparin Dosing Weight: 118 kg  Vital Signs: Temp: 97 F (36.1 C) (12/11 0501) Temp src: Oral (12/11 0501) BP: 111/65 mmHg (12/11 0501) Pulse Rate: 70  (12/11 0501)  Labs:  Basename 05/06/12 0725 05/06/12 0003 05/05/12 1601 05/05/12 0540 05/04/12 1820  HGB 14.2 -- -- 15.1 --  HCT 42.4 -- -- 43.9 45.4  PLT 166 -- -- 169 190  APTT -- -- -- -- 32  LABPROT 14.5 -- -- -- 12.9  INR 1.15 -- -- -- 0.98  HEPARINUNFRC 0.37 0.33 0.19* -- --  CREATININE -- -- -- -- 1.04  CKTOTAL -- -- -- -- --  CKMB -- -- -- -- --  TROPONINI -- -- -- -- --    Estimated Creatinine Clearance: 125.8 ml/min (by C-G formula based on Cr of 1.04).  Assessment:  Day # 2 of 5 minimum overlap heparin and Coumadin.  Heparin level is low therapeutic on 2800 units/hr.  Coumadin begun with 10 mg on 12/11.  Goal of Therapy:  INR 2-3 Heparin level 0.3-0.7 units/ml Monitor platelets by anticoagulation protocol: Yes   Plan:   Increase heparin drip to 2950 units/hr to try to keep level within goal range.  Repeat Coumadin 10 mg today.  Continue daily heparin level, PT/INR and CBC.  Nicolette Bang Donovan,RPh Pager: [phone] 05/06/2012,9:35 AM

## 2012-05-06 NOTE — Progress Notes (Signed)
ANTICOAGULATION CONSULT NOTE - Follow Up Consult  Pharmacy Consult for heparin Indication: pulmonary embolus  Labs:  Basename 05/06/12 0003 05/05/12 1601 05/05/12 0540 05/05/12 0525 05/04/12 1820  HGB -- -- 15.1 -- 15.4  HCT -- -- 43.9 -- 45.4  PLT -- -- 169 -- 190  APTT -- -- -- -- 32  LABPROT -- -- -- -- 12.9  INR -- -- -- -- 0.98  HEPARINUNFRC 0.33 0.19* -- <0.10* --  CREATININE -- -- -- -- 1.04  CKTOTAL -- -- -- -- --  CKMB -- -- -- -- --  TROPONINI -- -- -- -- --   103 Assessment: 51 yo male on IV heparin for PE. Heparin level (0.33) is at low-end of goal range on 2600 units/hr.   Goal of Therapy:  Heparin level 0.3-0.7 units/ml   Plan:  1. Increase IV heparin to 2800 units/hr to keep at-goal.  2. Heparin level in 6 hours to confirm.   Lorre Munroe, PharmD 05/06/12, 01:05 AM

## 2012-05-07 LAB — CBC
HCT: 41.1 % (ref 39.0–52.0)
Hemoglobin: 13 g/dL (ref 13.0–17.0)
MCH: 28 pg (ref 26.0–34.0)
MCHC: 31.6 g/dL (ref 30.0–36.0)
MCV: 88.4 fL (ref 78.0–100.0)
Platelets: 171 10*3/uL (ref 150–400)
RBC: 4.65 MIL/uL (ref 4.22–5.81)
RDW: 14.2 % (ref 11.5–15.5)
WBC: 7.9 10*3/uL (ref 4.0–10.5)

## 2012-05-07 LAB — PROTIME-INR
INR: 1.2 (ref 0.00–1.49)
Prothrombin Time: 15 seconds (ref 11.6–15.2)

## 2012-05-07 LAB — CARDIOLIPIN ANTIBODIES, IGG, IGM, IGA
Anticardiolipin IgA: 6 APL U/mL — ABNORMAL LOW (ref <22)
Anticardiolipin IgG: 11 GPL U/mL — ABNORMAL LOW (ref <23)
Anticardiolipin IgM: 15 MPL U/mL — ABNORMAL HIGH (ref <11)

## 2012-05-07 LAB — BETA-2-GLYCOPROTEIN I ABS, IGG/M/A
Beta-2 Glyco I IgG: 1 G Units (ref <20)
Beta-2-Glycoprotein I IgA: 5 A Units (ref <20)
Beta-2-Glycoprotein I IgM: 18 M Units (ref <20)

## 2012-05-07 LAB — HEPARIN LEVEL (UNFRACTIONATED): Heparin Unfractionated: 0.5 IU/mL (ref 0.30–0.70)

## 2012-05-07 MED ORDER — RIVAROXABAN 20 MG PO TABS: 20.0000 mg | ORAL_TABLET | Freq: Every day | ORAL | Status: DC

## 2012-05-07 MED ORDER — RIVAROXABAN 15 MG PO TABS
15.0000 mg | ORAL_TABLET | Freq: Two times a day (BID) | ORAL | Status: DC
Start: 2012-05-07 — End: 2012-05-07
  Administered 2012-05-07 (×2): 15 mg via ORAL
  Filled 2012-05-07 (×3): qty 1

## 2012-05-07 MED ORDER — RIVAROXABAN 15 MG PO TABS: 15.0000 mg | ORAL_TABLET | Freq: Two times a day (BID) | ORAL | Status: DC

## 2012-05-07 NOTE — Progress Notes (Signed)
ANTICOAGULATION CONSULT NOTE - Follow Up Consult  Pharmacy Consult for Heparin and Coumadin, Now to change to xarelto Indication: bilateral pulmonary embolus and DVT, left leg  No Known Allergies  Patient Measurements: Height: 6\' 2"  (188 cm) Weight: 324 lb 15.3 oz (147.4 kg) IBW/kg (Calculated) : 82.2  Heparin Dosing Weight: 118 kg  Vital Signs: Temp: 97.7 F (36.5 C) (12/12 0429) Temp src: Oral (12/12 0429) BP: 124/86 mmHg (12/12 0429) Pulse Rate: 78  (12/12 0429)  Labs:  Basename 05/07/12 0505 05/06/12 0725 05/06/12 0003 05/05/12 0540 05/04/12 1820  HGB 13.0 14.2 -- -- --  HCT 41.1 42.4 -- 43.9 --  PLT 171 166 -- 169 --  APTT -- -- -- -- 32  LABPROT 15.0 14.5 -- -- 12.9  INR 1.20 1.15 -- -- 0.98  HEPARINUNFRC 0.50 0.37 0.33 -- --  CREATININE -- -- -- -- 1.04  CKTOTAL -- -- -- -- --  CKMB -- -- -- -- --  TROPONINI -- -- -- -- --    Estimated Creatinine Clearance: 125.8 ml/min (by C-G formula based on Cr of 1.04).  Assessment: Evan Leonard is a 51 year old male with new PE/DVT and is on day 3/5 coumadin and heparin overlap. After discussion of both coumadin and xarelto, he has decided he would like to be on xarelto. He has good renal function and will not need any xarelto adjustment. Patient is 147kg. In the studies performed with xarelto, body weight >120kg and up to 190kg did not significantly influence xarelto exposure.   Heparin level is at goal and INR is 1.2. Will stop heparin drip with xarelto starting.   Goal of Therapy:  INR 2-3 Heparin level 0.3-0.7 units/ml Monitor platelets by anticoagulation protocol: Yes   Plan:  Begin xarelto 15mg  po BID x 21 days with food then start xarelto 20mg  daily with food D/C heparin and coumadin  D/C daily HL, pt/inr, CBC   Thank you,  Brett Fairy, PharmD, BCPS 05/07/2012 10:06 AM

## 2012-05-07 NOTE — Progress Notes (Signed)
Pt tolerated ambulation in hallway gait steady, room air sat 99 %, For dc home this pm. Pt will drive himself home, discussed with MD Georgette Dover

## 2012-05-07 NOTE — Discharge Summary (Signed)
Physician Discharge Summary  Evan Leonard VWU:[id number] DOB: 1961-04-07 DOA: 05/04/2012  PCP: Sheila Oats, MD  Admit date: 05/04/2012 Discharge date: 05/07/2012  Time spent: Greater than 30 minutes  Recommendations for Outpatient Follow-up:  1. With Primary Medical Doctor in 1 week from hospital discharge.  Discharge Diagnoses:  Principal Problem:  *Pulmonary embolism Active Problems:  Morbid obesity with body mass index of 40.0-44.9 in adult  Sleep apnea  DVT of left distal popliteal vein   Discharge Condition: Improved and stable.  Diet recommendation: Heart Healthy  Filed Weights   05/04/12 2325 05/05/12 0138  Weight: 147.419 kg (325 lb) 147.4 kg (324 lb 15.3 oz)    History of present illness:  51 y.o. male with hx of morbid obesity, likely sleep apnea though never been tested, on no chronic medication, presented to the ER as he noted DOE. He stated his symptoms started a couple of days ago, but on date of admission the symptoms were more severe. He denied any fever, chills, or even pleuritic chest pain. He had no leg swelling, recent long trip, recent surgery, testosterone use, trauma, or any family history of blood clots. Evaluation in the ER included an EKG with NSR, no acute ST-T changes, normal renal fx test, WBC of 11K, and Hb of 14 grams per DL. An CTA chest showed bilateral pulmonary embolism with right heart strain. He was not hypoxemia nor hypotensive. Was subsequently admitted to the step down unit.   Hospital Course:   Pulmonary embolism  Initial occurrence and not hypoxic but was having dyspnea on admission  CT angiogram ? right heart strain which could be chronic given suspicious for underlying sleep apnea.Troponins have been negative. Echo shows dilated right ventricle but without decrease in function-may be chronic.  Patient was initially placed on IV heparin and Coumadin per pharmacy. Patient was hesitant to start Xarelto earlier but finally decided  today that he wants to do Xarelto rather than Coumadin. A limited hypercoagulable panel was: Results as below. At some point in the future, will need formal evaluation by hematologist as an outpatient.  DVT of left distal popliteal vein  Anticoagulation as above.  Sleep apnea  Does not have a formal diagnosis and wants establish his primary care physician- can be screened in regards to pursuing outpatient polysomnogram  while here we'll do a nocturnal trending pulse oximetry study-O2 sats at night have been in the high 90s.  Morbid obesity with body mass index of 40.0-44.9 in adult   Consultants:  None   Procedures:  None   Antibiotics:  None   Discharge Exam:  Complaints Denies complaints. Denies chest pain, dyspnea or leg pain.  Filed Vitals:   05/06/12 0501 05/06/12 1335 05/06/12 1949 05/07/12 0429  BP: 111/65 130/70 108/74 124/86  Pulse: 70 75 82 78  Temp: 97 F (36.1 C) 97 F (36.1 C) 97.8 F (36.6 C) 97.7 F (36.5 C)  TempSrc: Oral Oral Oral Oral  Resp: 20 18 20 18   Height:      Weight:      SpO2: 99% 99% 97% 94%    General: Comfortable.  Lungs: Clear to auscultation. No increased work of breathing.  Cardiovascular: First and second heart sounds heard, regularly regular. No JVD, murmurs and trace bilateral ankle edema. Telemetry shows normal sinus rhythm.  Abdomen: Nondistended, soft and nontender. Normal bowel sounds heard.  Extremities: Symmetric 5 x 5 power.  CNS: Alert and oriented. No focal neurological deficits   Discharge Instructions  Discharge Orders    Future Orders Please Complete By Expires   Diet - low sodium heart healthy      Increase activity slowly      Call MD for:  severe uncontrolled pain      Call MD for:  difficulty breathing, headache or visual disturbances          Medication List     As of 05/07/2012  3:20 PM    TAKE these medications         Rivaroxaban 20 MG Tabs   Commonly known as: XARELTO   Take 1 tablet  (20 mg total) by mouth daily with supper. First dose on Thu 05/28/12 at 1700.      Rivaroxaban 15 MG Tabs tablet   Commonly known as: XARELTO   Take 1 tablet (15 mg total) by mouth 2 (two) times daily before a meal. First dose on Thu 05/07/12 at 1100, For 21 days.         Follow-up Information    Follow up with Primary Medical Doctor.. Schedule an appointment as soon as possible for a visit in 1 week. (Call number provided to find new MD.)           The results of significant diagnostics from this hospitalization (including imaging, microbiology, ancillary and laboratory) are listed below for reference.    Significant Diagnostic Studies: Dg Chest 2 View  05/04/2012  *RADIOLOGY REPORT*  Clinical Data: Left-sided chest pain.  Short of breath.  Bilateral leg swelling.  CHEST - 2 VIEW  Comparison: None.  Findings: Mild basilar atelectasis.  No airspace disease.  No effusion.  Cardiopericardial silhouette within normal limits for projection.  Suboptimal lateral view because the arms are not fully raised over head.  IMPRESSION: No acute cardiopulmonary disease.  Low volume chest.   Original Report Authenticated By: Andreas Newport, M.D.    Ct Angio Chest Pe W/cm &/or Wo Cm  05/04/2012  *RADIOLOGY REPORT*  Clinical Data: Short of breath.  Elevated D-dimer.  CT ANGIOGRAPHY CHEST  Technique:  Multidetector CT imaging of the chest using the standard protocol during bolus administration of intravenous contrast. Multiplanar reconstructed images including MIPs were obtained and reviewed to evaluate the vascular anatomy.  Contrast: OMNIPAQUE IOHEXOL 350 MG/ML SOLN  Comparison: Chest radiograph 05/04/2012.  Findings: Bilateral pulmonary emboli are present.  There is no saddle embolus.  Near occlusive lower lobe pulmonary emboli are present.  There is straightening of the intraventricular septum of the heart, compatible with right heart strain.  There is no pulmonary infarct.  There is some mild  subpleural ground-glass attenuation in the anterior right upper lobe which is nonspecific. Given the markedly low lung volumes, this probably represents a small area of atelectasis.  Pneumonia is possible.  Pulmonary infarction is possible but considered less likely.  Incidental imaging the upper abdomen is within normal limits.  No effusion. No adenopathy. No aggressive osseous lesions.  Thoracic spondylosis is present.  IMPRESSION: 1.  Technically adequate study which is positive for pulmonary emboli to both the left and right pulmonary arteries.   No convincing evidence of pulmonary infarction. 2.  Straightening of the interventricular septum compatible with right heart strain. 3. Critical Value/emergent results were called by telephone at the time of interpretation on 05/04/2012 at 2248 hours to Dr.RANCOUR, , who verbally acknowledged these results.   Original Report Authenticated By: Andreas Newport, M.D.    2 D Echo  Study Conclusions  - Left ventricle: The cavity size  was normal. Wall thickness was normal. Systolic function was normal. The estimated ejection fraction was in the range of 60% to 65%. Although no diagnostic regional wall motion abnormality was identified, this possibility cannot be completely excluded on the basis of this study. Doppler parameters are consistent with abnormal left ventricular relaxation (grade 1 diastolic dysfunction). - Ventricular septum: Septal motion showed "bounce". - Mitral valve: Mildly calcified annulus. - Right ventricle: The cavity size was moderately dilated. Wall thickness was normal. - Atrial septum: No defect or patent foramen ovale was identified.   Impressions:  - Right ventricular dilation without decreased function. PA pressures were not able to be extimated due to poor TR Jet velocity envelopes and inablity to visualize the IVC.  Lower extremity venous dopplers  Summary:  - No evidence of deep vein thrombosis involving the right lower  extremity. - Findings consistent with deep vein thrombosis involving the left popliteal vein.    Microbiology: No results found for this or any previous visit (from the past 240 hour(s)).   Labs: Basic Metabolic Panel:  Lab 05/04/12 1610  NA 141  K 4.3  CL 104  CO2 27  GLUCOSE 100*  BUN 17  CREATININE 1.04  CALCIUM 9.5  MG --  PHOS --   Liver Function Tests: No results found for this basename: AST:5,ALT:5,ALKPHOS:5,BILITOT:5,PROT:5,ALBUMIN:5 in the last 168 hours No results found for this basename: LIPASE:5,AMYLASE:5 in the last 168 hours No results found for this basename: AMMONIA:5 in the last 168 hours CBC:  Lab 05/07/12 0505 05/06/12 0725 05/05/12 0540 05/04/12 1820  WBC 7.9 7.4 10.0 11.1*  NEUTROABS -- -- -- 6.5  HGB 13.0 14.2 15.1 15.4  HCT 41.1 42.4 43.9 45.4  MCV 88.4 87.8 87.5 88.3  PLT 171 166 169 190   Cardiac Enzymes: No results found for this basename: CKTOTAL:5,CKMB:5,CKMBINDEX:5,TROPONINI:5 in the last 168 hours BNP: BNP (last 3 results)  Basename 05/04/12 1820  PROBNP 216.3*   CBG: No results found for this basename: GLUCAP:5 in the last 168 hours  Other lab data  Homocysteine: 11.6  Limited hypercoagulable: Anticardiolipin IgA: 6, anticardiolipin IgG: 11, anticardiolipin IgM: 15, lupus anticoagulant: Not detected. Beta 2 glycoprotein: Within normal limits. Negative for factor V mutation.   Signed:  Baylyn Sickles  [facility] 05/07/2012, 3:20 PM

## 2012-05-08 ENCOUNTER — Telehealth: Payer: Self-pay | Admitting: Internal Medicine

## 2012-05-08 DIAGNOSIS — G4730 Sleep apnea, unspecified: Secondary | ICD-10-CM

## 2012-05-08 NOTE — Telephone Encounter (Signed)
Pt was d/c'd from hospital 12/12. Pt has [employer]. Would like to know if you would accept him as a new pt/post hospital follow up. Pt was seem in [facility] for pulmonary embolism (DVT of Left  Distal popliteal). Pls advise.

## 2012-05-08 NOTE — Telephone Encounter (Signed)
Yes, I will accept as new pt 

## 2012-05-08 NOTE — Telephone Encounter (Signed)
LMOM

## 2012-05-11 ENCOUNTER — Ambulatory Visit (INDEPENDENT_AMBULATORY_CARE_PROVIDER_SITE_OTHER): Payer: PRIVATE HEALTH INSURANCE | Admitting: Internal Medicine

## 2012-05-11 ENCOUNTER — Encounter: Payer: Self-pay | Admitting: Internal Medicine

## 2012-05-11 VITALS — BP 132/82 | HR 72 | Temp 98.7°F | Ht 74.5 in | Wt 377.0 lb

## 2012-05-11 DIAGNOSIS — G4730 Sleep apnea, unspecified: Secondary | ICD-10-CM

## 2012-05-11 DIAGNOSIS — I2699 Other pulmonary embolism without acute cor pulmonale: Secondary | ICD-10-CM

## 2012-05-11 DIAGNOSIS — Z0000 Encounter for general adult medical examination without abnormal findings: Secondary | ICD-10-CM

## 2012-05-11 MED ORDER — RIVAROXABAN 20 MG PO TABS: 20.0000 mg | ORAL_TABLET | Freq: Every day | ORAL | Status: DC

## 2012-05-11 NOTE — Assessment & Plan Note (Addendum)
51 year old white male with history of morbid obesity with new onset bilateral pulmonary embolism. Patient found to have left lower remedy DVT. Limited hypercoagulable panel unremarkable. I recommended at least one year of anticoagulation therapy. He is tolerating xarelto.  No evidence of abnormal bleeding. Discussed monitoring CBC and kidney function every 3 months. Refer to hematology for further evaluation - Dr. Myna Hidalgo. Discussed possibly obtaining CT of abdomen and pelvis with IV contrast to rule out malignancy.  Patient referred to gastroenterology for routine screening colonoscopy.

## 2012-05-11 NOTE — Progress Notes (Signed)
Subjective:    Patient ID: Evan Leonard, male    DOB: April 04, 1961, 51 y.o.   MRN: [id number]  HPI  51 year old white male to establish and for hospital followup. Patient admitted on 05/04/2012 secondary to complaints of shortness of breath and dyspnea with exertion. CT angiogram of chest showed bilateral pulmonary embolism with right heart strain.  Patient found to have DVT of left popliteal vein. Patient anticoagulated with Xarelto. A limited hypercoagulable panel was completed. Homocystine was 11.6. Anticardiolipin IgA-6, anticardiolipin IgG-11, anticardiolipin IgM-15, lupus anticoagulant-not detected, beta-2 glycoprotein-within normal limits. Negative for factor V Leiden mutation.  Patient denies any prolonged travel prior to episode of pulmonary embolism. He denies any family history of DVTs or PEs. Since hospital discharge patient denies any unusual shortness of breath. He has intermittent cough.  Data: 2-D echocardiogram Impressions right ventricular dilation without decreased function. PA pressures were not able to be estimted due to poor TR.  Patient reports nursing staff noted frequent nocturnal desaturations while he was hospitalized. He admits to snoring. He has been morbidly obese for many years.   Review of Systems   Constitutional: Negative for activity change, appetite change and unexpected weight change.  Eyes: Negative for visual disturbance.  Respiratory: Negative for chest tightness and shortness of breath.   Cardiovascular: Negative for chest pain.  Genitourinary: Negative for difficulty urinating.  Neurological: Negative for headaches.  Gastrointestinal: Negative for abdominal pain, heartburn melena or hematochezia.  Screening olonoscopy not completed Psych: Negative for depression or anxiety        Past Medical History  Diagnosis Date  . Obesity   . Pulmonary embolism 05/04/2012    bilaterally  . Exertional dyspnea 05/04/2012    "isolated episode" (05/05/2012)   . Sleep apnea     "pretty sure; never been tested" (05/05/2012)  . History of chickenpox     History   Social History  . Marital Status: Single    Spouse Name: N/A    Number of Children: N/A  . Years of Education: N/A   Occupational History  . [employer]   Social History Main Topics  . Smoking status: Never Smoker   . Smokeless tobacco: Never Used  . Alcohol Use: No  . Drug Use: No  . Sexually Active: Not Currently   Other Topics Concern  . Not on file   Social History Narrative  . No narrative on file    Past Surgical History  Procedure Date  . No past surgeries     Family History  Problem Relation Age of Onset  . Diabetes Mother     No Known Allergies  Current Outpatient Prescriptions on File Prior to Visit  Medication Sig Dispense Refill  . Rivaroxaban (XARELTO) 15 MG TABS tablet Take 1 tablet (15 mg total) by mouth 2 (two) times daily before a meal. First dose on Thu 05/07/12 at 1100, For 21 days.  41 tablet  0    BP 132/82  Pulse 72  Temp 98.7 F (37.1 C) (Oral)  Ht 6' 2.5" (1.892 m)  Wt 377 lb (171.006 kg)  BMI 47.76 kg/m2    Objective:   Physical Exam  Constitutional: He is oriented to person, place, and time.       Pleasant, morbidly obese 51 year old male  HENT:  Head: Normocephalic and atraumatic.  Right Ear: External ear normal.  Left Ear: External ear normal.       Crowded oropharynx  Eyes: EOM are normal. Pupils are equal,  round, and reactive to light.  Neck: Normal range of motion. Neck supple.       No carotid bruit  Cardiovascular: Normal rate, regular rhythm and normal heart sounds.   No murmur heard. Pulmonary/Chest: Effort normal and breath sounds normal. He has no wheezes.  Abdominal: Soft. Bowel sounds are normal. He exhibits no mass.       Abdominal obesity Umbilical hernia  Musculoskeletal: He exhibits edema.  Neurological: He is alert and oriented to person, place, and time. No cranial nerve  deficit.  Skin: Skin is dry.  Psychiatric: He has a normal mood and affect. His behavior is normal.          Assessment & Plan:

## 2012-05-11 NOTE — Patient Instructions (Addendum)
Please complete the following lab tests before your next follow up appointment: BMET, CBCD - 415.19 FLP, LFTs, TSH - v70 A1c - 790.29

## 2012-05-11 NOTE — Assessment & Plan Note (Signed)
Patient reports during hospitalization, nursing staff noted frequent nocturnal desaturations. He has multiple risk factors for obstructive sleep apnea. Arrange split night study.

## 2012-05-13 ENCOUNTER — Telehealth: Payer: Self-pay | Admitting: Hematology & Oncology

## 2012-05-13 NOTE — Telephone Encounter (Signed)
Left pt message to call and schedule appointment °

## 2012-05-15 ENCOUNTER — Encounter: Payer: Self-pay | Admitting: Internal Medicine

## 2012-05-18 ENCOUNTER — Telehealth: Payer: Self-pay | Admitting: Hematology & Oncology

## 2012-05-18 NOTE — Telephone Encounter (Signed)
Pt aware of 06-15-12 appointment. Per MD that date is fine.

## 2012-06-12 ENCOUNTER — Other Ambulatory Visit: Payer: Self-pay | Admitting: Medical

## 2012-06-12 DIAGNOSIS — I2699 Other pulmonary embolism without acute cor pulmonale: Secondary | ICD-10-CM

## 2012-06-15 ENCOUNTER — Ambulatory Visit (HOSPITAL_BASED_OUTPATIENT_CLINIC_OR_DEPARTMENT_OTHER): Payer: PRIVATE HEALTH INSURANCE | Admitting: Hematology & Oncology

## 2012-06-15 ENCOUNTER — Other Ambulatory Visit (HOSPITAL_BASED_OUTPATIENT_CLINIC_OR_DEPARTMENT_OTHER): Payer: PRIVATE HEALTH INSURANCE | Admitting: Lab

## 2012-06-15 ENCOUNTER — Ambulatory Visit: Payer: PRIVATE HEALTH INSURANCE

## 2012-06-15 VITALS — BP 131/79 | HR 93 | Temp 99.0°F | Resp 18

## 2012-06-15 DIAGNOSIS — Z86711 Personal history of pulmonary embolism: Secondary | ICD-10-CM

## 2012-06-15 DIAGNOSIS — Z86718 Personal history of other venous thrombosis and embolism: Secondary | ICD-10-CM

## 2012-06-15 DIAGNOSIS — I2699 Other pulmonary embolism without acute cor pulmonale: Secondary | ICD-10-CM

## 2012-06-15 DIAGNOSIS — I82409 Acute embolism and thrombosis of unspecified deep veins of unspecified lower extremity: Secondary | ICD-10-CM

## 2012-06-15 DIAGNOSIS — E669 Obesity, unspecified: Secondary | ICD-10-CM

## 2012-06-15 LAB — CBC WITH DIFFERENTIAL (CANCER CENTER ONLY)
BASO#: 0 10*3/uL (ref 0.0–0.2)
BASO%: 0.4 % (ref 0.0–2.0)
EOS%: 4.3 % (ref 0.0–7.0)
Eosinophils Absolute: 0.4 10*3/uL (ref 0.0–0.5)
HCT: 43 % (ref 38.7–49.9)
HGB: 14.3 g/dL (ref 13.0–17.1)
LYMPH#: 2.8 10*3/uL (ref 0.9–3.3)
LYMPH%: 29.4 % (ref 14.0–48.0)
MCH: 29.3 pg (ref 28.0–33.4)
MCHC: 33.3 g/dL (ref 32.0–35.9)
MCV: 88 fL (ref 82–98)
MONO#: 0.7 10*3/uL (ref 0.1–0.9)
MONO%: 7.4 % (ref 0.0–13.0)
NEUT#: 5.5 10*3/uL (ref 1.5–6.5)
NEUT%: 58.5 % (ref 40.0–80.0)
Platelets: 185 10*3/uL (ref 145–400)
RBC: 4.88 10*6/uL (ref 4.20–5.70)
RDW: 14.2 % (ref 11.1–15.7)
WBC: 9.4 10*3/uL (ref 4.0–10.0)

## 2012-06-15 LAB — CHCC SATELLITE - SMEAR

## 2012-06-15 NOTE — Progress Notes (Signed)
CC:   Barbette Hair. [name], DO  DIAGNOSES: 1. Pulmonary embolism-idiopathic. 2. Left lower extremity deep vein thrombosis.  HISTORY OF PRESENT ILLNESS:  Mr. Chill is a very nice 52 year old white gentleman.  He works at a [employer] in [employer].  He is on his feet most of the day.  He is morbidly obese.  He has had no real health issues because this periods.  There is no diabetes.  He was taking a shower I think around  the 9th of December.  He began to feel short of breath.  He ultimately went to the emergency room.  He had a CT angiogram of the chest.  This did show bilateral pulmonary emboli.  There was no saddle embolus.  He had near occlusive lower lobe pulmonary emboli.  There was some right heart strain.  He was started on anticoagulation with Xarelto.  He did have Dopplers done.  He was found to have a DVT in the left leg. This was in the left popliteal vein.  Apparently, he did have some hypercoagulable studies done, I guess in the hospital.  Hypercoagulable studies that were done were all negative.  He had a minimally elevated IgM anticardiolipin antibody.  Lupus anticoagulant was negative.  Beta 2 glycoproteins were also normal.  Factor V Leiden mutation was negative.  He was kindly referred to the [facility] by Dr. Thomos Lemons for additional management recommendations for this thromboembolic event.  He has not had any kind of recent travel.  Again, he has been up and about.  He has not been immobile.  There is question whether not he has sleep apnea.  He is not on anything for sleep apnea.  He has never had a colonoscopy.  From what he tells me, Dr. [name] is supposed to arrange for this.  He denies any change in bowel or bladder habits.  He has had no weight loss or weight gain.  He really has not had any kind of issues with his left leg.  He says after being on his feet all day that he does have some swelling in his legs.  He does not  want any compression stockings.  He has had no headache.  There are no swallowing difficulties.  His breathing is doing better now.  He has had no change in his genital area.  He has not noticed any kind of testicular swelling.  He has not noticed any kind of skin rashes.  Again, he was kindly referred to the [facility] for an evaluation.  He is doing well with the Xarelto.  He has had no bleeding from this. He does feel a little tired.  PAST MEDICAL HISTORY:  Remarkable for:  1. Morbid obesity. 2. Sleep apnea-untreated.  ALLERGIES:  None.  MEDICATIONS:  Xarelto 20 mg p.o. daily.  SOCIAL HISTORY:  Negative for tobacco use.  There is no alcohol use.  He has no obvious occupational exposures.  FAMILY HISTORY:  Negative for any type of thromboembolic event in his family.  There is no history of cancer in the family.  His mother did have a diabetes.  REVIEW OF SYSTEMS:  As stated in the history of present illness.  No additional findings are noted on a 12-system review.  PHYSICAL EXAMINATION:  General:  This is a morbidly obese white gentleman in no obvious distress.  Vital signs:  Temperature of 99, pulse 93, respiratory rate 18, blood pressure 131/79.  Weight is 375 pounds.  Head and neck:  Normocephalic, atraumatic skull.  There are no ocular or oral lesions.  There are no palpable cervical or supraclavicular lymph nodes.  Thyroid is not palpable.  Lungs:  Some slight decrease at the bases bilaterally.  No rales, wheezes, or rhonchi are noted bilaterally.  Cardiac:  Regular rate and rhythm with a normal S1 and S2.  There are no murmurs, rubs, or bruits.  Abdomen:  Soft with good bowel sounds.  He is morbidly obese.  There is no fluid wave. There is no guarding or rebound tenderness.  There is no palpable hepatosplenomegaly.  Extremities:  Stasis dermatitis changes in his legs bilaterally.  He has no palpable venous cord in the left leg.  He  has maybe some slight increase in non-pitting edema of the left lower leg. He has decent pulses in his distal extremities.  Back:  No tenderness over the spine, ribs, or hips.  Skin:  No rashes, ecchymoses, or petechiae.  Neurological:  No focal neurological deficits.  LABORATORY STUDIES:  White cell count is 9.4, hemoglobin 14.3, hematocrit 43, platelet count is 185.  IMPRESSION:  Mr. Alegria is a very nice 52 year old white gentleman with what certainly appears be an idiopathic thromboembolic event.  He had a left popliteal deep vein thrombosis.  He has presented with bilateral pulmonary emboli.  I think the only risk factor that I can find with him is his morbid obesity.  Studies show only that there are definite inflammatory cytokines in patients who are morbidly obese that can trigger the clotting process.  I am going to send off some additional clotting studies on him.  I am sending off antithrombin III, protein C and protein S levels.  I want to just make sure that there is no underlying clotting disorder.  I am also sending off a prothrombin II gene mutation study.  As far as duration of anticoagulation, I would probably favor 1 year.  I think any time when a patient has a pulmonary embolism, this does require a greater duration of anticoagulation.  I think Xarelto would be a very good choice for him.  With Xarelto, you have stable pharmacokinetics despite the patient's size.  There are very few medication interactions.  There are no food interactions.  The only contraindication for Xarelto is renal insufficiency, which is not a problem for Mr. Dauphinais.  I do want to have his CT angiogram and Doppler repeated in 3 months.  I think this would be worthwhile doing so that we can see the effectiveness of our therapeutic anticoagulation program.  I spent a good hour or so with Mr. Mccormac.  He is a real nice guy.  He is originally from [place].  He lived over in the same area as  my wife, but went to a different high school, although he graduated the same year as she did.  I am sure that she probably knows some of his relatives.  I want see Mr. Zehring back in 3 months.  We will see him back the same day he has his CT angiogram of the chest and Doppler of his left leg.  I answered all of his questions.  I told him to make sure he calls [place] if he has any problems with respect to bleeding.    ______________________________ Josph Macho, M.D. PRE/MEDQ  D:  06/15/2012  T:  06/15/2012  Job:  [id number]

## 2012-06-15 NOTE — Progress Notes (Signed)
This office note has been dictated.

## 2012-06-18 LAB — PROTEIN S ACTIVITY: Protein S Activity: 108 % (ref 69–129)

## 2012-06-18 LAB — PROTHROMBIN GENE MUTATION

## 2012-06-18 LAB — D-DIMER, QUANTITATIVE: D-Dimer, Quant: 0.36 ug/mL-FEU (ref 0.00–0.48)

## 2012-06-18 LAB — PROTEIN C, TOTAL: Protein C, Total: 73 % (ref 72–160)

## 2012-06-18 LAB — ANTITHROMBIN III: AntiThromb III Func: 85 % (ref 76–126)

## 2012-06-18 LAB — PROTEIN C ACTIVITY: Protein C Activity: 135 % — ABNORMAL HIGH (ref 75–133)

## 2012-06-18 LAB — PROTEIN S, TOTAL: Protein S Total: 99 % (ref 60–150)

## 2012-06-22 ENCOUNTER — Telehealth: Payer: Self-pay | Admitting: *Deleted

## 2012-06-22 NOTE — Telephone Encounter (Addendum)
Message copied by Mirian Capuchin on Mon Jun 22, 2012  3:52 PM ------      Message from: Arlan Organ R      Created: Sun Jun 21, 2012  4:19 PM       Call - all blood clotting studies are ok!!  Cindee Lame Above message left on pt's private mobil phone

## 2012-08-04 ENCOUNTER — Other Ambulatory Visit: Payer: Self-pay | Admitting: Medical

## 2012-08-05 ENCOUNTER — Ambulatory Visit (HOSPITAL_BASED_OUTPATIENT_CLINIC_OR_DEPARTMENT_OTHER): Payer: PRIVATE HEALTH INSURANCE | Admitting: Lab

## 2012-08-05 ENCOUNTER — Other Ambulatory Visit (HOSPITAL_BASED_OUTPATIENT_CLINIC_OR_DEPARTMENT_OTHER): Payer: PRIVATE HEALTH INSURANCE

## 2012-08-05 ENCOUNTER — Ambulatory Visit (HOSPITAL_BASED_OUTPATIENT_CLINIC_OR_DEPARTMENT_OTHER)
Admission: RE | Admit: 2012-08-05 | Discharge: 2012-08-05 | Disposition: A | Discharge status: Home / Self Care | Payer: PRIVATE HEALTH INSURANCE | Source: Ambulatory Visit | Attending: Hematology & Oncology | Admitting: Hematology & Oncology

## 2012-08-05 ENCOUNTER — Ambulatory Visit (HOSPITAL_BASED_OUTPATIENT_CLINIC_OR_DEPARTMENT_OTHER): Payer: PRIVATE HEALTH INSURANCE | Admitting: Medical

## 2012-08-05 ENCOUNTER — Encounter (HOSPITAL_BASED_OUTPATIENT_CLINIC_OR_DEPARTMENT_OTHER): Payer: Self-pay

## 2012-08-05 DIAGNOSIS — Z86711 Personal history of pulmonary embolism: Secondary | ICD-10-CM | POA: Insufficient documentation

## 2012-08-05 DIAGNOSIS — I2699 Other pulmonary embolism without acute cor pulmonale: Secondary | ICD-10-CM

## 2012-08-05 DIAGNOSIS — I82409 Acute embolism and thrombosis of unspecified deep veins of unspecified lower extremity: Secondary | ICD-10-CM

## 2012-08-05 DIAGNOSIS — I2510 Atherosclerotic heart disease of native coronary artery without angina pectoris: Secondary | ICD-10-CM | POA: Insufficient documentation

## 2012-08-05 DIAGNOSIS — Z86718 Personal history of other venous thrombosis and embolism: Secondary | ICD-10-CM

## 2012-08-05 DIAGNOSIS — Z7901 Long term (current) use of anticoagulants: Secondary | ICD-10-CM

## 2012-08-05 LAB — CBC WITH DIFFERENTIAL (CANCER CENTER ONLY)
BASO#: 0 10*3/uL (ref 0.0–0.2)
BASO%: 0.5 % (ref 0.0–2.0)
EOS%: 4.5 % (ref 0.0–7.0)
Eosinophils Absolute: 0.4 10*3/uL (ref 0.0–0.5)
HCT: 42.2 % (ref 38.7–49.9)
HGB: 14 g/dL (ref 13.0–17.1)
LYMPH#: 2.4 10*3/uL (ref 0.9–3.3)
LYMPH%: 28.1 % (ref 14.0–48.0)
MCH: 28.9 pg (ref 28.0–33.4)
MCHC: 33.2 g/dL (ref 32.0–35.9)
MCV: 87 fL (ref 82–98)
MONO#: 0.5 10*3/uL (ref 0.1–0.9)
MONO%: 5.8 % (ref 0.0–13.0)
NEUT#: 5.2 10*3/uL (ref 1.5–6.5)
NEUT%: 61.1 % (ref 40.0–80.0)
Platelets: 174 10*3/uL (ref 145–400)
RBC: 4.84 10*6/uL (ref 4.20–5.70)
RDW: 14.9 % (ref 11.1–15.7)
WBC: 8.5 10*3/uL (ref 4.0–10.0)

## 2012-08-05 LAB — D-DIMER, QUANTITATIVE: D-Dimer, Quant: 0.36 ug/mL-FEU (ref 0.00–0.48)

## 2012-08-05 MED ORDER — IOHEXOL 350 MG/ML SOLN
80.0000 mL | Freq: Once | INTRAVENOUS | Status: AC | PRN
  Administered 2012-08-05: 100 mL via INTRAVENOUS

## 2012-08-05 NOTE — Progress Notes (Signed)
DIAGNOSES: 1. Pulmonary embolism-idiopathic. 2. Left lower extremity deep vein thrombosis.  HISTORY OF PRESENT ILLNESS: Mr. she presents today for an office followup visit.  He is now been on Xarelto for about 3 months.  He did have repeat imaging with Doppler ultrasound as well as CT angiogram.  The Doppler ultrasound revealed no evidence of lower extremity, deep vein thrombosis.  The CT angiogram was negative for pulmonary embolus.  These were performed on 08/05/2012.  His hypercoagulable workup in the past has been negative.  He is doing well on the Xarelto without any complications.  He does not report any bleeding.  He denies any chest pain, or shortness of breath.  He does not report any lower leg swelling or tenderness.  He, reports, that he's lost about 10 pounds.  He does have a good appetite, but reports he is on a diet.  He denies any nausea, vomiting, diarrhea, constipation, chest pain, shortness of breath, or cough.  He denies any fevers, chills, or night sweats.  He denies any obvious, or abnormal bleeding.  He denies any lower leg swelling.  He denies any headaches, visual changes, or rashes.  Review of Systems: Constitutional:Negative for malaise/fatigue, fever, chills, weight loss, diaphoresis, activity change, appetite change, and unexpected weight change.  HEENT: Negative for double vision, blurred vision, visual loss, ear pain, tinnitus, congestion, rhinorrhea, epistaxis sore throat or sinus disease, oral pain/lesion, tongue soreness Respiratory: Negative for cough, chest tightness, shortness of breath, wheezing and stridor.  Cardiovascular: Negative for chest pain, palpitations, leg swelling, orthopnea, PND, DOE or claudication Gastrointestinal: Negative for nausea, vomiting, abdominal pain, diarrhea, constipation, blood in stool, melena, hematochezia, abdominal distention, anal bleeding, rectal pain, anorexia and hematemesis.  Genitourinary: Negative for dysuria, frequency,  hematuria,  Musculoskeletal: Negative for myalgias, back pain, joint swelling, arthralgias and gait problem.  Skin: Negative for rash, color change, pallor and wound.  Neurological:. Negative for dizziness/light-headedness, tremors, seizures, syncope, facial asymmetry, speech difficulty, weakness, numbness, headaches and paresthesias.  Hematological: Negative for adenopathy. Does not bruise/bleed easily.  Psychiatric/Behavioral:  Negative for depression, no loss of interest in normal activity or change in sleep pattern.   Physical Exam: This is a morbidly, obese, 52 year old, white gentleman, in no obvious distress Vitals: Temperature 98.3 degrees, pulse 84, respirations 20, blood pressure 127/67, weight 367 pounds HEENT reveals a normocephalic, atraumatic skull, no scleral icterus, no oral lesions  Neck is supple without any cervical or supraclavicular adenopathy.  Lungs are clear to auscultation bilaterally. There are no wheezes, rales or rhonci Cardiac is regular rate and rhythm with a normal S1 and S2. There are no murmurs, rubs, or bruits.  Abdomen is soft with good bowel sounds, there is no palpable mass. There is no palpable hepatosplenomegaly. There is no palpable fluid wave.  Musculoskeletal no tenderness of the spine, ribs, or hips.  Extremities there are no clubbing, cyanosis, or edema.  Skin no petechia, purpura or ecchymosis Neurologic is nonfocal.  Laboratory Data: White count 8.5, hemoglobin 14.0, hematocrit 42.2, platelets 174,000  Current Outpatient Prescriptions on File Prior to Visit  Medication Sig Dispense Refill  . Rivaroxaban (XARELTO) 15 MG TABS tablet Take 25 mg by mouth daily. First dose on Thu 05/07/12 at 1100, For 21 days.      . Rivaroxaban (XARELTO) 20 MG TABS Take 1 tablet (20 mg total) by mouth daily with supper. First dose on Thu 05/28/12 at 1700.  30 tablet  2   No current facility-administered medications on file prior to visit.  Assessment/Plan: This is  a pleasant, 52 year old, white gentleman, with the following issues:  #1.  Idiopathic, thromboembolic event.  He did have a left popliteal deep vein thrombosis and presented with bilateral pulmonary emboli.  Thankfully, his Doppler ultrasound and CT angiogram are negative for any thromboembolic events.  His previous clots have resolved.  Again, his hypercoagulable workup was negative.  He does have morbid obesity, as well as sleep apnea, which both can be a trigger for the clotting process.  The overall plan, is to keep him on Xarelto for at least one year.  #2.  Followup.  We will follow back up with Mr. Raynald Kemp in 3 months, but before then should there be questions or concerns.

## 2012-08-07 ENCOUNTER — Telehealth: Payer: Self-pay | Admitting: *Deleted

## 2012-08-07 NOTE — Telephone Encounter (Signed)
Called patient to let him know that his ultrasound showed no blood clot in lung or leg per dr. Myna Hidalgo

## 2012-08-07 NOTE — Telephone Encounter (Signed)
Message copied by Anselm Jungling on Fri Aug 07, 2012  9:56 AM ------      Message from: Arlan Organ R      Created: Thu Aug 06, 2012  4:22 PM       Call - NO blood clot in lung or leg!!  Cindee Lame ------

## 2012-10-01 ENCOUNTER — Other Ambulatory Visit: Payer: Self-pay | Admitting: Internal Medicine

## 2012-11-05 ENCOUNTER — Ambulatory Visit (HOSPITAL_BASED_OUTPATIENT_CLINIC_OR_DEPARTMENT_OTHER): Payer: PRIVATE HEALTH INSURANCE | Admitting: Lab

## 2012-11-05 ENCOUNTER — Telehealth: Payer: Self-pay | Admitting: Hematology & Oncology

## 2012-11-05 ENCOUNTER — Ambulatory Visit (HOSPITAL_BASED_OUTPATIENT_CLINIC_OR_DEPARTMENT_OTHER): Payer: PRIVATE HEALTH INSURANCE | Admitting: Hematology & Oncology

## 2012-11-05 VITALS — BP 133/71 | HR 96 | Temp 98.1°F | Resp 18 | Ht 74.0 in | Wt 377.0 lb

## 2012-11-05 DIAGNOSIS — Z7901 Long term (current) use of anticoagulants: Secondary | ICD-10-CM

## 2012-11-05 DIAGNOSIS — G4730 Sleep apnea, unspecified: Secondary | ICD-10-CM

## 2012-11-05 DIAGNOSIS — I2699 Other pulmonary embolism without acute cor pulmonale: Secondary | ICD-10-CM

## 2012-11-05 LAB — CBC WITH DIFFERENTIAL (CANCER CENTER ONLY)
BASO#: 0.1 10*3/uL (ref 0.0–0.2)
BASO%: 0.4 % (ref 0.0–2.0)
EOS%: 3.6 % (ref 0.0–7.0)
Eosinophils Absolute: 0.4 10*3/uL (ref 0.0–0.5)
HCT: 43.3 % (ref 38.7–49.9)
HGB: 14.1 g/dL (ref 13.0–17.1)
LYMPH#: 3.2 10*3/uL (ref 0.9–3.3)
LYMPH%: 27.8 % (ref 14.0–48.0)
MCH: 29 pg (ref 28.0–33.4)
MCHC: 32.6 g/dL (ref 32.0–35.9)
MCV: 89 fL (ref 82–98)
MONO#: 0.9 10*3/uL (ref 0.1–0.9)
MONO%: 8.3 % (ref 0.0–13.0)
NEUT#: 6.8 10*3/uL — ABNORMAL HIGH (ref 1.5–6.5)
NEUT%: 59.9 % (ref 40.0–80.0)
Platelets: 205 10*3/uL (ref 145–400)
RBC: 4.87 10*6/uL (ref 4.20–5.70)
RDW: 14.6 % (ref 11.1–15.7)
WBC: 11.4 10*3/uL — ABNORMAL HIGH (ref 4.0–10.0)

## 2012-11-05 NOTE — Progress Notes (Signed)
This office note has been dictated.

## 2012-11-05 NOTE — Telephone Encounter (Signed)
Talked with Terri at sleep center, I faxed referral to them. She said they would contact patient to schedule.

## 2012-11-06 NOTE — Progress Notes (Signed)
DIAGNOSIS:  Idiopathic pulmonary embolism/left lower extremity DVT. Current therapy throughout for 20 mg p.o. daily-1 year of therapy indicated.  INTERIM HISTORY:  Mr. Makki comes in for followup.  We last saw him back in March.  At that point in time, his CT angiogram was negative for residual pulmonary embolism.  His left lower extremity Doppler also was negative.  Again, I feel that 1 year of Xarelto would be appropriate for him. Throughout his plan, just being tired.  He says that we something more, he just sleeps.  He says, he can fall alseep sitting up for a couple of hours..  I really think that he has sleep apnea.  He probably needs to have a sleep study done, and probably needs to be on  CPAP.  Otherwise, he is doing okay.  He did have a singular episode of some shortness of breath.  This resolved on its own.  He has had some leg swelling bilaterally.  Again, he is a morbidly obese, so he may have some slight stasis dermatitis type changes.  He has had no change in bowel or bladder habits.  He has had no fever. He has had no bony pain.  PHYSICAL EXAMINATION:  General:  This is a obese white gentleman in no obvious distress.  Vital sings:  Temperature of 98.1, pulse 96, respiratory rate 18, blood pressure 133/71.  Weight is 377.  Head and neck:  Normocephalic, atraumatic skull.  There are no ocular or oral lesions.  There are no palpable cervical or supraclavicular lymph nodes. Lungs:  Clear bilaterally.  Cardiac:  Regular rate and rhythm with a normal S1 and S2.  There are no murmurs, rubs, or bruits.  Abdomen: Soft with good bowel sounds.  There is no palpable abdominal mass.  He has no fluid wave.  There is no palpable hepatosplenomegaly. Extremities: Chronic stasis dermatitis changes in his lower legs.  There is some slight more chronic swelling of the right leg done than the left leg.  Neurological:  Shows no focal neurological deficits.  Skin:  Shows the stasis  dermatitis changes in his lower legs.  LABORATORY STUDIES:  Show a white count 11.4, hemoglobin 14.1, hematocrit 42.3, platelet count 205.  IMPRESSION:  Mr. Baranek is a nice 52 year old gentleman with idiopathic DVT/PE.  Again, he does have the obesity issue.  This may have been a risk factor for him.  Again, I really think that a sleep study is going to be indicated for him.  We will see about getting 1 ordered for him.  If he is positive for sleep apnea, then I believe, we have to defer back to his family doctor for management recommendations.  We will see him back ourselves in another 3 months.  He present back in December 2013.  As such, he will go through this year with therapeutic anticoagulation.    ______________________________ Josph Macho, M.D. PRE/MEDQ  D:  11/05/2012  T:  11/06/2012  Job:  [id number]

## 2012-11-12 ENCOUNTER — Telehealth: Payer: Self-pay | Admitting: Hematology & Oncology

## 2012-11-12 NOTE — Telephone Encounter (Signed)
Called Terri at sleep center because appointment has not been made. She said she call pt today.

## 2012-11-13 ENCOUNTER — Telehealth: Payer: Self-pay | Admitting: Hematology & Oncology

## 2012-11-13 NOTE — Telephone Encounter (Signed)
I called Terri at sleep lab she said she has called him and left message to schedule appointment. I called pt's cell left message for him to call me. I called contact number and she said she would have him call us.

## 2012-11-16 ENCOUNTER — Telehealth: Payer: Self-pay | Admitting: Hematology & Oncology

## 2012-11-16 NOTE — Telephone Encounter (Signed)
Left pt message to call sleep lab they are trying to get in touch

## 2012-11-19 ENCOUNTER — Telehealth: Payer: Self-pay | Admitting: Hematology & Oncology

## 2012-11-19 NOTE — Telephone Encounter (Signed)
Pt has appointment for sleep lab on calender.

## 2012-12-10 ENCOUNTER — Ambulatory Visit (HOSPITAL_BASED_OUTPATIENT_CLINIC_OR_DEPARTMENT_OTHER): Payer: PRIVATE HEALTH INSURANCE | Attending: Hematology & Oncology | Admitting: Radiology

## 2012-12-10 VITALS — Ht 74.0 in | Wt 377.0 lb

## 2012-12-10 DIAGNOSIS — G4733 Obstructive sleep apnea (adult) (pediatric): Secondary | ICD-10-CM | POA: Insufficient documentation

## 2012-12-10 DIAGNOSIS — G4730 Sleep apnea, unspecified: Secondary | ICD-10-CM

## 2012-12-19 DIAGNOSIS — G4733 Obstructive sleep apnea (adult) (pediatric): Secondary | ICD-10-CM

## 2012-12-19 DIAGNOSIS — R0609 Other forms of dyspnea: Secondary | ICD-10-CM

## 2012-12-19 DIAGNOSIS — R0989 Other specified symptoms and signs involving the circulatory and respiratory systems: Secondary | ICD-10-CM

## 2012-12-19 NOTE — Procedures (Signed)
NAME:  Evan Leonard, Evan Leonard NO.:  [id number]  MEDICAL RECORD NO.:  [id number]          PATIENT TYPE:  OUT  LOCATION:  SLEEP CENTER                 FACILITY[facility]  [facility]  PHYSICIAN:  Beauford Lando D. Maple Hudson, MD, FCCP, FACPDATE OF BIRTH:  09[birth year]08[birth year]1958  DATE OF STUDY:  12/10/2012                           NOCTURNAL POLYSOMNOGRAM  REFERRING PHYSICIAN:  Josph Macho, M.D.  INDICATION FOR STUDY:  Hypersomnia with sleep apnea.  EPWORTH SLEEPINESS SCORE:  18/24.  BMI 48.4, weight 377 pounds, height 74 inches, neck 20 inches.  MEDICATIONS:  Home medications charted for review.  SLEEP ARCHITECTURE:  Total sleep time 299 minutes with sleep efficiency 83.5%.  Stage I was 17.2%, stage II 75.6%.  Stage III absent.  REM 7.2% of total sleep time.  Sleep latency 16.5 minutes.  REM latency 275.5 minutes.  Awake after sleep onset 43 minutes.  Arousal index 75.9. Bedtime medication:  None.  RESPIRATORY DATA:  Apnea-hypopnea index (AHI) 87.3 per hour.  A total of 435 events was scored including 379 obstructive apneas, 11 mixed apneas, 45 hypopneas.  Events were more common while non-supine.  REM AHI 47.4 per hour.  This is a diagnostic NPSG protocol as ordered, and CPAP was not done.  OXYGEN DATA:  Moderate to loud snoring with oxygen desaturation to a nadir of 79% and mean oxygen saturation through the study of 93.6% on room air.  CARDIAC DATA:  Sinus rhythm with PVC.  MOVEMENT-PARASOMNIA:  No significant movement disturbance.  Bathroom x1.  IMPRESSIONS-RECOMMENDATIONS: 1. Severe obstructive sleep apnea/hypopnea syndrome, AHI 87.3 per hour     with non-positional events.  REM AHI 47.4 per hour.  Moderate to     loud snoring with oxygen desaturation to a nadir of 79% and mean     oxygen saturation through the study of 93.6% on room air. 2. This study was ordered as a diagnostic NPSG protocol without CPAP.     Consider return for dedicated CPAP titration study or evaluate for  alternative management as appropriate.    Jaydynn Wolford D. Maple Hudson, MD, [facility], FACP Diplomate, American Board of Sleep Medicine   CDY/MEDQ  D:  12/19/2012 13:52:59  T:  12/19/2012 14:51:38  Job:  [id number]

## 2012-12-24 ENCOUNTER — Other Ambulatory Visit: Payer: Self-pay | Admitting: Internal Medicine

## 2012-12-24 DIAGNOSIS — G4733 Obstructive sleep apnea (adult) (pediatric): Secondary | ICD-10-CM

## 2012-12-29 ENCOUNTER — Encounter: Payer: Self-pay | Admitting: Internal Medicine

## 2013-01-02 ENCOUNTER — Other Ambulatory Visit: Payer: Self-pay | Admitting: Internal Medicine

## 2013-01-11 ENCOUNTER — Encounter: Payer: Self-pay | Admitting: Internal Medicine

## 2013-01-11 ENCOUNTER — Other Ambulatory Visit: Payer: Self-pay | Admitting: Internal Medicine

## 2013-02-04 ENCOUNTER — Ambulatory Visit (HOSPITAL_BASED_OUTPATIENT_CLINIC_OR_DEPARTMENT_OTHER): Payer: PRIVATE HEALTH INSURANCE | Admitting: Hematology & Oncology

## 2013-02-04 ENCOUNTER — Other Ambulatory Visit (HOSPITAL_BASED_OUTPATIENT_CLINIC_OR_DEPARTMENT_OTHER): Payer: PRIVATE HEALTH INSURANCE | Admitting: Lab

## 2013-02-04 VITALS — BP 142/75 | HR 89 | Temp 98.1°F | Resp 24

## 2013-02-04 DIAGNOSIS — I2699 Other pulmonary embolism without acute cor pulmonale: Secondary | ICD-10-CM

## 2013-02-04 DIAGNOSIS — I82409 Acute embolism and thrombosis of unspecified deep veins of unspecified lower extremity: Secondary | ICD-10-CM

## 2013-02-04 DIAGNOSIS — Z7901 Long term (current) use of anticoagulants: Secondary | ICD-10-CM

## 2013-02-04 DIAGNOSIS — I824Y9 Acute embolism and thrombosis of unspecified deep veins of unspecified proximal lower extremity: Secondary | ICD-10-CM

## 2013-02-04 LAB — CBC WITH DIFFERENTIAL (CANCER CENTER ONLY)
BASO#: 0 10*3/uL (ref 0.0–0.2)
BASO%: 0.4 % (ref 0.0–2.0)
EOS%: 3.5 % (ref 0.0–7.0)
Eosinophils Absolute: 0.4 10*3/uL (ref 0.0–0.5)
HCT: 42.5 % (ref 38.7–49.9)
HGB: 14 g/dL (ref 13.0–17.1)
LYMPH#: 2.8 10*3/uL (ref 0.9–3.3)
LYMPH%: 27.7 % (ref 14.0–48.0)
MCH: 29.2 pg (ref 28.0–33.4)
MCHC: 32.9 g/dL (ref 32.0–35.9)
MCV: 89 fL (ref 82–98)
MONO#: 0.9 10*3/uL (ref 0.1–0.9)
MONO%: 8.8 % (ref 0.0–13.0)
NEUT#: 6.1 10*3/uL (ref 1.5–6.5)
NEUT%: 59.6 % (ref 40.0–80.0)
Platelets: 182 10*3/uL (ref 145–400)
RBC: 4.8 10*6/uL (ref 4.20–5.70)
RDW: 14.7 % (ref 11.1–15.7)
WBC: 10.2 10*3/uL — ABNORMAL HIGH (ref 4.0–10.0)

## 2013-02-04 NOTE — Progress Notes (Signed)
This office note has been dictated.

## 2013-02-05 NOTE — Progress Notes (Signed)
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  CURRENT THERAPY:  Xarelto 20 mg p.o. daily.  INTERIM HISTORY:  Evan Leonard comes in for his followup.  He is doing fairly well.  We last saw back in June.  He had a good summer.  He had no problems with cough or shortness breath.  He is still working.  He does get some leg swelling on occasion.  This is because he stands up all the time.  There have been no problems with bleeding.  He has had no nausea or vomiting.  There has been no headache.  He is tired all the time.  He had a sleep study done.  I saw the results.  He clearly has sleep apnea.  He is going to be seen by Dr. Maple Hudson to try to get this fixed.  I think Evan Leonard would be a great candidate for a gastric bypass.  I told him to discuss this with Dr. Cathie Hoops.  Overall, his performance status is ECOG 0.  PHYSICAL EXAMINATION:  General:  This is an obese white gentleman in no obvious distress.  Vital signs:  Temperature of 98.1, pulse 89, respiratory rate 14, blood pressure 142/75.  Weight is 382 pounds.  Head and neck:  Normocephalic, atraumatic skull.  There are no ocular or oral lesions.  There are no palpable cervical or supraclavicular lymph nodes. Lungs:  Clear bilaterally.  Cardiac:  Regular rate and rhythm with a normal S1 and S2.  There are no murmurs, rubs or bruits.  Abdomen: Soft.  He has good bowel sounds.  He is morbidly obese.  There is no fluid wave.  There is no palpable hepatosplenomegaly.  Extremities: Some chronic stasis dermatitis changes in the lower legs.  He has no joint swelling.  His strength is symmetric in the upper and lower extremities.  Back:  No tenderness over the spine, ribs, or hips. Neurologic:  No focal neurological deficits.  LABORATORY STUDIES:  White cell count is 10.2, hemoglobin 14, hematocrit 43, platelet count is 182.  His D-dimer is pending.  IMPRESSION:  Evan Leonard is a nice 52 year old gentleman  with a deep vein thrombosis and pulmonary embolus.  He had a deep vein thrombosis of his left leg.  This was back in December of 2013.  We will go with 1 year of Xarelto.  After this, I will go ahead and get him on aspirin at 182 mg a day.  I believe that getting his sleep apnea treated will help him out quite a bit.  I also believe that long term, he will really benefit from a gastric bypass.  If he has a gastric bypass, he will definitely need a filter to prevent thromboembolic events.  I want to see him back in December.    ______________________________ Evan Leonard, M.D. PRE/MEDQ  D:  02/04/2013  T:  02/05/2013  Job:  [id number]

## 2013-02-15 ENCOUNTER — Encounter: Payer: Self-pay | Admitting: Internal Medicine

## 2013-02-15 ENCOUNTER — Ambulatory Visit (INDEPENDENT_AMBULATORY_CARE_PROVIDER_SITE_OTHER): Payer: PRIVATE HEALTH INSURANCE | Admitting: Internal Medicine

## 2013-02-15 VITALS — BP 124/80 | HR 95 | Ht 74.0 in | Wt 386.2 lb

## 2013-02-15 DIAGNOSIS — G4733 Obstructive sleep apnea (adult) (pediatric): Secondary | ICD-10-CM

## 2013-02-15 DIAGNOSIS — Z23 Encounter for immunization: Secondary | ICD-10-CM

## 2013-02-15 NOTE — Progress Notes (Signed)
02/15/13- 76 yoM never smoker referred courtesy of Dr [name]; sleep study attached.  NPSG 12/10/12- Severe OSA AHI 87.3/ hr, weight 377 lbs Complains of difficulty sleeping soundly, of waking frequently. Sleeps better sitting up than lying down. Bedtime 11 PM with sleep latency about 15 minutes. Wakes every 2 hours until gets up at 4:45 AM. Breathes easier sitting up. Dozes off easily if sitting quietly. Snores loudly. Lives alone. No ENT surgery. Medical history of pulmonary embolism December 2013. 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 Father died old age in his 44s, mother died of complications of diabetes.  Prior to Admission medications   Medication Sig Start Date End Date Taking? Authorizing Provider  Multiple Vitamin (THERAPEUTIC MULTIVITAMIN PO) Take by mouth every morning.    Historical Provider, MD  XARELTO 20 MG TABS tablet TAKE 1 TABLET BY MOUTH EVERY DAY 01/11/13   Doe-Hyun Sherran Needs, DO   Past Medical History  Diagnosis Date  . Obesity   . Pulmonary embolism 05/04/2012    bilaterally  . Exertional dyspnea 05/04/2012    "isolated episode" (05/05/2012)  . Sleep apnea     "pretty sure; never been tested" (05/05/2012)  . History of chickenpox    Past Surgical History  Procedure Laterality Date  . No past surgeries     Family History  Problem Relation Age of Onset  . Diabetes Mother    History   Social History  . Marital Status: Single    Spouse Name: N/A    Number of Children: N/A  . Years of Education: N/A   Occupational History  . [employer]   Social History Main Topics  . Smoking status: Never Smoker   . Smokeless tobacco: Never Used  . Alcohol Use: No  . Drug Use: No  . Sexual Activity: Not Currently   Other Topics Concern  . Not on file   Social History Narrative  . No narrative on file   ROS-see HPI Constitutional:   No-   weight loss, night sweats, fevers, chills, +fatigue, lassitude. HEENT:   No-  headaches, difficulty swallowing,  tooth/dental problems, sore throat,       No-  sneezing, itching, ear ache, nasal congestion, post nasal drip,  CV:  No-   chest pain, orthopnea, PND, swelling in lower extremities, anasarca, dizziness, palpitations Resp: No-   shortness of breath with exertion or at rest.              No-   productive cough,  No non-productive cough,  No- coughing up of blood.              No-   change in color of mucus.  No- wheezing.   Skin: No-   rash or lesions. GI:  No-   heartburn, indigestion, abdominal pain, nausea, vomiting, diarrhea,                 change in bowel habits, loss of appetite GU: No-   dysuria, change in color of urine, no urgency or frequency.  No- flank pain. MS:  No-   joint pain or swelling.  No- decreased range of motion.  No- back pain. Neuro-     nothing unusual Psych:  No- change in mood or affect. No depression or anxiety.  No memory loss.  OBJ- Physical Exam General- Alert, Oriented, Affect-appropriate, Distress- none acute, obese Skin- rash-none, lesions- none, excoriation- none Lymphadenopathy- none Head- atraumatic  Eyes- Gross vision intact, PERRLA, conjunctivae and secretions clear            Ears- Hearing, canals-normal            Nose- Clear, no-Septal dev, mucus, polyps, erosion, perforation             Throat- Mallampati IV , mucosa clear , drainage- none, tonsils- atrophic Neck- flexible , trachea midline, no stridor , thyroid nl, carotid no bruit Chest - symmetrical excursion , unlabored           Heart/CV- RRR , no murmur , no gallop  , no rub, nl s1 s2                           - JVD- none , edema- none, stasis changes- none, varices- none           Lung- clear to P&A, wheeze- none, cough- none , dullness-none, rub- none           Chest wall-  Abd- tender-no, distended-no, bowel sounds-present, HSM- no Br/ Gen/ Rectal- Not done, not indicated Extrem- cyanosis- none, clubbing, none, atrophy- none, strength- nl. +Very heavy legs, Homan's  Neg Neuro- grossly intact to observation

## 2013-02-15 NOTE — Patient Instructions (Addendum)
Order- [facility] DME New CPAP auto 5-20 cwp x 7 days for pressure recommendation. Humidifier, mask of choice, supplies    Dx OSA  Please call as needed  Flu vax

## 2013-02-24 DIAGNOSIS — E119 Type 2 diabetes mellitus without complications: Secondary | ICD-10-CM

## 2013-02-24 HISTORY — DX: Type 2 diabetes mellitus without complications: E11.9

## 2013-02-25 NOTE — Assessment & Plan Note (Signed)
We discussed the physiology, medical concerns, treatments for sleep apnea. We emphasized sleep hygiene, importance of weight management, his responsibility to drive safely Plan-start CPAP, auto titration for pressure recommendation

## 2013-03-22 ENCOUNTER — Encounter (HOSPITAL_COMMUNITY): Payer: Self-pay | Admitting: Emergency Medicine

## 2013-03-22 ENCOUNTER — Emergency Department (INDEPENDENT_AMBULATORY_CARE_PROVIDER_SITE_OTHER): Payer: PRIVATE HEALTH INSURANCE

## 2013-03-22 ENCOUNTER — Emergency Department (HOSPITAL_COMMUNITY)
Admission: EM | Admit: 2013-03-22 | Discharge: 2013-03-22 | Disposition: A | Discharge status: Nursing Home (Includes ICF) | Payer: PRIVATE HEALTH INSURANCE | Source: Home / Self Care | Attending: Family Medicine | Admitting: Family Medicine

## 2013-03-22 DIAGNOSIS — Z86711 Personal history of pulmonary embolism: Secondary | ICD-10-CM

## 2013-03-22 DIAGNOSIS — E871 Hypo-osmolality and hyponatremia: Secondary | ICD-10-CM | POA: Diagnosis present

## 2013-03-22 DIAGNOSIS — R609 Edema, unspecified: Secondary | ICD-10-CM

## 2013-03-22 DIAGNOSIS — I824Y9 Acute embolism and thrombosis of unspecified deep veins of unspecified proximal lower extremity: Secondary | ICD-10-CM

## 2013-03-22 DIAGNOSIS — L03119 Cellulitis of unspecified part of limb: Principal | ICD-10-CM | POA: Diagnosis present

## 2013-03-22 DIAGNOSIS — I2699 Other pulmonary embolism without acute cor pulmonale: Secondary | ICD-10-CM

## 2013-03-22 DIAGNOSIS — G4733 Obstructive sleep apnea (adult) (pediatric): Secondary | ICD-10-CM | POA: Diagnosis present

## 2013-03-22 DIAGNOSIS — R05 Cough: Secondary | ICD-10-CM

## 2013-03-22 DIAGNOSIS — J069 Acute upper respiratory infection, unspecified: Secondary | ICD-10-CM | POA: Diagnosis present

## 2013-03-22 DIAGNOSIS — D72829 Elevated white blood cell count, unspecified: Secondary | ICD-10-CM | POA: Diagnosis present

## 2013-03-22 DIAGNOSIS — R509 Fever, unspecified: Secondary | ICD-10-CM

## 2013-03-22 DIAGNOSIS — Z86718 Personal history of other venous thrombosis and embolism: Secondary | ICD-10-CM

## 2013-03-22 DIAGNOSIS — E876 Hypokalemia: Secondary | ICD-10-CM | POA: Diagnosis present

## 2013-03-22 DIAGNOSIS — Z6841 Body Mass Index (BMI) 40.0 and over, adult: Secondary | ICD-10-CM

## 2013-03-22 DIAGNOSIS — Z7901 Long term (current) use of anticoagulants: Secondary | ICD-10-CM

## 2013-03-22 DIAGNOSIS — R059 Cough, unspecified: Secondary | ICD-10-CM

## 2013-03-22 DIAGNOSIS — L02419 Cutaneous abscess of limb, unspecified: Principal | ICD-10-CM | POA: Diagnosis present

## 2013-03-22 DIAGNOSIS — R319 Hematuria, unspecified: Secondary | ICD-10-CM | POA: Diagnosis present

## 2013-03-22 HISTORY — DX: Acute embolism and thrombosis of unspecified deep veins of unspecified lower extremity: I82.409

## 2013-03-22 LAB — CBC WITH DIFFERENTIAL/PLATELET
Basophils Absolute: 0 10*3/uL (ref 0.0–0.1)
Basophils Relative: 0 % (ref 0–1)
Eosinophils Absolute: 0 10*3/uL (ref 0.0–0.7)
Eosinophils Relative: 0 % (ref 0–5)
HCT: 41.2 % (ref 39.0–52.0)
Hemoglobin: 14.5 g/dL (ref 13.0–17.0)
Lymphocytes Relative: 9 % — ABNORMAL LOW (ref 12–46)
Lymphs Abs: 2.1 10*3/uL (ref 0.7–4.0)
MCH: 30.5 pg (ref 26.0–34.0)
MCHC: 35.2 g/dL (ref 30.0–36.0)
MCV: 86.7 fL (ref 78.0–100.0)
Monocytes Absolute: 1.3 10*3/uL — ABNORMAL HIGH (ref 0.1–1.0)
Monocytes Relative: 5 % (ref 3–12)
Neutro Abs: 21 10*3/uL — ABNORMAL HIGH (ref 1.7–7.7)
Neutrophils Relative %: 86 % — ABNORMAL HIGH (ref 43–77)
Platelets: 171 10*3/uL (ref 150–400)
RBC: 4.75 MIL/uL (ref 4.22–5.81)
RDW: 15.1 % (ref 11.5–15.5)
WBC: 24.4 10*3/uL — ABNORMAL HIGH (ref 4.0–10.5)

## 2013-03-22 LAB — BASIC METABOLIC PANEL
BUN: 16 mg/dL (ref 6–23)
CO2: 20 mEq/L (ref 19–32)
Calcium: 9.2 mg/dL (ref 8.4–10.5)
Chloride: 95 mEq/L — ABNORMAL LOW (ref 96–112)
Creatinine, Ser: 1.07 mg/dL (ref 0.50–1.35)
GFR calc Af Amer: 90 mL/min — ABNORMAL LOW (ref >90)
GFR calc non Af Amer: 77 mL/min — ABNORMAL LOW (ref >90)
Glucose, Bld: 154 mg/dL — ABNORMAL HIGH (ref 70–99)
Potassium: 3.2 mEq/L — ABNORMAL LOW (ref 3.5–5.1)
Sodium: 127 mEq/L — ABNORMAL LOW (ref 135–145)

## 2013-03-22 MED ORDER — IBUPROFEN 800 MG PO TABS
ORAL_TABLET | ORAL | Status: AC
  Filled 2013-03-22: qty 1

## 2013-03-22 MED ORDER — ONDANSETRON 4 MG PO TBDP
ORAL_TABLET | ORAL | Status: AC
  Filled 2013-03-22: qty 2

## 2013-03-22 MED ORDER — IBUPROFEN 800 MG PO TABS
800.0000 mg | ORAL_TABLET | Freq: Once | ORAL | Status: AC
  Administered 2013-03-22: 800 mg via ORAL

## 2013-03-22 MED ORDER — ONDANSETRON 4 MG PO TBDP
8.0000 mg | ORAL_TABLET | Freq: Once | ORAL | Status: AC
  Administered 2013-03-22: 8 mg via ORAL

## 2013-03-22 NOTE — ED Notes (Signed)
Ginger ale provided.

## 2013-03-22 NOTE — ED Notes (Signed)
States this feels "nothing like" when he had a PE.

## 2013-03-22 NOTE — ED Notes (Signed)
Report called to Eston Esters, ED First Nurse.

## 2013-03-22 NOTE — ED Notes (Addendum)
Here from [facility] Woke Saturday night with sweats, also reports nausea. "Thought it was flu, seen at [facility] tonight and instructted to come here saying he might be sicker than he thought". Also report fever & productive cough, greenish/ brown.  Given zofran and advil at [facility]. Stomach feels better after zofran. "still feel like I have to cough up phlegm. Takes xarelto for h/o DVT. cxr resulted and reviewed. Alert, NAD, calm, obese, steady slow gait, "doing well w/o complications on xarelto, will be switched to ASA soon", denies pain.

## 2013-03-22 NOTE — ED Provider Notes (Signed)
CSN: [id number]     Arrival date & time 03/22/13  1904 History   First MD Initiated Contact with Patient 03/22/13 2007     Chief Complaint  Patient presents with  . Cough  . Fever  . Weakness   (Consider location/radiation/quality/duration/timing/severity/associated sxs/prior Treatment) HPI Comments: 52 year old male presents complaining of being sick since Saturday. This began with nausea and a productive cough, and subjective fever. This has all gotten gradually worse with increasing weakness as well until he decided to come in today. He has felt nauseous since the beginning but only vomited once. He denies feeling short of breath and says he is breathing so hard because it keeps him from vomiting. He says he thinks he may be dehydrated and his urine appears very concentrated. Denies chest pain.   Patient is a 52 y.o. male presenting with cough, fever, and weakness.  Cough Associated symptoms: fever and shortness of breath   Associated symptoms: no chest pain, no chills, no myalgias, no rash and no sore throat   Fever Associated symptoms: cough, nausea and vomiting   Associated symptoms: no chest pain, no chills, no diarrhea, no dysuria, no myalgias, no rash and no sore throat   Weakness Associated symptoms include shortness of breath. Pertinent negatives include no chest pain and no abdominal pain.    Past Medical History  Diagnosis Date  . Obesity   . Pulmonary embolism 05/04/2012    bilaterally  . Exertional dyspnea 05/04/2012    "isolated episode" (05/05/2012)  . Sleep apnea     "pretty sure; never been tested" (05/05/2012)  . History of chickenpox   . DVT (deep venous thrombosis)    Past Surgical History  Procedure Laterality Date  . No past surgeries     Family History  Problem Relation Age of Onset  . Diabetes Mother    History  Substance Use Topics  . Smoking status: Never Smoker   . Smokeless tobacco: Never Used  . Alcohol Use: No    Review of Systems   Constitutional: Positive for fever and fatigue. Negative for chills.  HENT: Negative for sore throat.   Eyes: Negative for visual disturbance.  Respiratory: Positive for cough and shortness of breath.   Cardiovascular: Positive for leg swelling. Negative for chest pain and palpitations.  Gastrointestinal: Positive for nausea and vomiting. Negative for abdominal pain, diarrhea and constipation.  Genitourinary: Negative for dysuria, urgency, frequency and hematuria.       Dark urine  Musculoskeletal: Negative for arthralgias, myalgias, neck pain and neck stiffness.  Skin: Negative for rash.  Neurological: Positive for weakness. Negative for dizziness and light-headedness.    Allergies  Review of patient's allergies indicates no known allergies.  Home Medications   Current Outpatient Rx  Name  Route  Sig  Dispense  Refill  . XARELTO 20 MG TABS tablet      TAKE 1 TABLET BY MOUTH EVERY DAY   30 tablet   2   . Multiple Vitamin (THERAPEUTIC MULTIVITAMIN PO)   Oral   Take by mouth every morning.          BP 137/78  Pulse 119  Temp(Src) 100.2 F (37.9 C) (Oral)  Resp 28  SpO2 98% Physical Exam  Nursing note and vitals reviewed. Constitutional: He is oriented to person, place, and time. He appears well-developed and well-nourished. He appears ill. No distress.  Morbidly obese body habitus  HENT:  Head: Normocephalic.  Cardiovascular: Regular rhythm and normal heart sounds.  Tachycardia present.  No murmur heard. Bilateral lower extremities with pitting edema up to above the knees. The right lower extremity is greater than the left, warm, nontender  Pulmonary/Chest: Tachypnea noted. No respiratory distress. He has wheezes. He has rales in the right lower field.  Neurological: He is alert and oriented to person, place, and time. Coordination normal.  Skin: Skin is warm and dry. No rash noted. He is not diaphoretic.  Psychiatric: He has a normal mood and affect. Judgment  normal.    ED Course  Procedures (including critical care time) Labs Review Labs Reviewed - No data to display Imaging Review Dg Chest 2 View  03/22/2013   CLINICAL DATA:  Fever, nausea  EXAM: CHEST  2 VIEW  COMPARISON:  May 04, 2012  FINDINGS: The heart size and mediastinal contours are within normal limits. There is minor linear atelectasis of left lung base. No focal infiltrate, pulmonary edema, or pleural effusion is noted. The visualized skeletal structures are stable.  IMPRESSION: No active cardiopulmonary disease. Minor linear atelectasis of left lung base.   Electronically Signed   By: Sherian Rein M.D.   On: 03/22/2013 20:20   EKG shows sinus tachycardia   MDM   1. Cough   2. Fever   3. Peripheral edema   4. Pulmonary embolism, hx of   5. DVT of popliteal vein, left    This patient's symptoms may indicate CHF and recurrence of DVT/PE. He is transferred to the emergency department for further evaluation and treatment    Graylon Good, PA-C 03/22/13 2102

## 2013-03-22 NOTE — ED Notes (Signed)
C/O productive cough, weakness, nausea x 2 days.  Believes he is dehydrated - describes concentrated urine, but denies vomiting.  Pt appears SOB after walking back to exam room.  States he's "maybe a little" more SOB than usual.  BBS clear.  Has been taking Advil - none today.

## 2013-03-23 ENCOUNTER — Encounter (HOSPITAL_COMMUNITY): Payer: Self-pay | Admitting: Radiology

## 2013-03-23 ENCOUNTER — Inpatient Hospital Stay (HOSPITAL_COMMUNITY)
Admission: EM | Admit: 2013-03-23 | Discharge: 2013-03-26 | DRG: 603 | Disposition: A | Discharge status: Home / Self Care | Payer: PRIVATE HEALTH INSURANCE | Attending: Internal Medicine | Admitting: Internal Medicine

## 2013-03-23 ENCOUNTER — Emergency Department (HOSPITAL_COMMUNITY): Payer: PRIVATE HEALTH INSURANCE

## 2013-03-23 ENCOUNTER — Inpatient Hospital Stay (HOSPITAL_COMMUNITY): Payer: PRIVATE HEALTH INSURANCE

## 2013-03-23 DIAGNOSIS — L0390 Cellulitis, unspecified: Secondary | ICD-10-CM

## 2013-03-23 DIAGNOSIS — E876 Hypokalemia: Secondary | ICD-10-CM | POA: Diagnosis present

## 2013-03-23 DIAGNOSIS — R059 Cough, unspecified: Secondary | ICD-10-CM

## 2013-03-23 DIAGNOSIS — M7989 Other specified soft tissue disorders: Secondary | ICD-10-CM

## 2013-03-23 DIAGNOSIS — I2699 Other pulmonary embolism without acute cor pulmonale: Secondary | ICD-10-CM | POA: Diagnosis present

## 2013-03-23 DIAGNOSIS — I517 Cardiomegaly: Secondary | ICD-10-CM

## 2013-03-23 DIAGNOSIS — D72829 Elevated white blood cell count, unspecified: Secondary | ICD-10-CM | POA: Diagnosis present

## 2013-03-23 DIAGNOSIS — R05 Cough: Secondary | ICD-10-CM | POA: Diagnosis present

## 2013-03-23 DIAGNOSIS — L02419 Cutaneous abscess of limb, unspecified: Secondary | ICD-10-CM

## 2013-03-23 DIAGNOSIS — L03119 Cellulitis of unspecified part of limb: Secondary | ICD-10-CM

## 2013-03-23 DIAGNOSIS — R319 Hematuria, unspecified: Secondary | ICD-10-CM | POA: Diagnosis present

## 2013-03-23 DIAGNOSIS — E871 Hypo-osmolality and hyponatremia: Secondary | ICD-10-CM | POA: Diagnosis present

## 2013-03-23 DIAGNOSIS — G4733 Obstructive sleep apnea (adult) (pediatric): Secondary | ICD-10-CM

## 2013-03-23 DIAGNOSIS — L0291 Cutaneous abscess, unspecified: Secondary | ICD-10-CM

## 2013-03-23 LAB — HEMOGLOBIN A1C
Hgb A1c MFr Bld: 7.1 % — ABNORMAL HIGH (ref <5.7)
Mean Plasma Glucose: 157 mg/dL — ABNORMAL HIGH (ref <117)

## 2013-03-23 LAB — COMPREHENSIVE METABOLIC PANEL
ALT: 17 U/L (ref 0–53)
AST: 19 U/L (ref 0–37)
Albumin: 2.8 g/dL — ABNORMAL LOW (ref 3.5–5.2)
Alkaline Phosphatase: 74 U/L (ref 39–117)
BUN: 17 mg/dL (ref 6–23)
CO2: 25 mEq/L (ref 19–32)
Calcium: 8.8 mg/dL (ref 8.4–10.5)
Chloride: 96 mEq/L (ref 96–112)
Creatinine, Ser: 1.07 mg/dL (ref 0.50–1.35)
GFR calc Af Amer: 90 mL/min — ABNORMAL LOW (ref >90)
GFR calc non Af Amer: 77 mL/min — ABNORMAL LOW (ref >90)
Glucose, Bld: 163 mg/dL — ABNORMAL HIGH (ref 70–99)
Potassium: 3.4 mEq/L — ABNORMAL LOW (ref 3.5–5.1)
Sodium: 130 mEq/L — ABNORMAL LOW (ref 135–145)
Total Bilirubin: 1 mg/dL (ref 0.3–1.2)
Total Protein: 7.3 g/dL (ref 6.0–8.3)

## 2013-03-23 LAB — URINE MICROSCOPIC-ADD ON

## 2013-03-23 LAB — HEPATIC FUNCTION PANEL
ALT: 18 U/L (ref 0–53)
AST: 18 U/L (ref 0–37)
Albumin: 2.9 g/dL — ABNORMAL LOW (ref 3.5–5.2)
Alkaline Phosphatase: 69 U/L (ref 39–117)
Bilirubin, Direct: 0.3 mg/dL (ref 0.0–0.3)
Indirect Bilirubin: 0.7 mg/dL (ref 0.3–0.9)
Total Bilirubin: 1 mg/dL (ref 0.3–1.2)
Total Protein: 7.6 g/dL (ref 6.0–8.3)

## 2013-03-23 LAB — PHOSPHORUS: Phosphorus: 1.7 mg/dL — ABNORMAL LOW (ref 2.3–4.6)

## 2013-03-23 LAB — URINALYSIS, ROUTINE W REFLEX MICROSCOPIC
Glucose, UA: NEGATIVE mg/dL
Ketones, ur: NEGATIVE mg/dL
Leukocytes, UA: NEGATIVE
Nitrite: NEGATIVE
Protein, ur: 30 mg/dL — AB
Specific Gravity, Urine: 1.03 (ref 1.005–1.030)
Urobilinogen, UA: 2 mg/dL — ABNORMAL HIGH (ref 0.0–1.0)
pH: 5.5 (ref 5.0–8.0)

## 2013-03-23 LAB — CG4 I-STAT (LACTIC ACID): Lactic Acid, Venous: 1.19 mmol/L (ref 0.5–2.2)

## 2013-03-23 LAB — CBC
HCT: 39.8 % (ref 39.0–52.0)
Hemoglobin: 13.5 g/dL (ref 13.0–17.0)
MCH: 29.5 pg (ref 26.0–34.0)
MCHC: 33.9 g/dL (ref 30.0–36.0)
MCV: 87.1 fL (ref 78.0–100.0)
Platelets: 162 10*3/uL (ref 150–400)
RBC: 4.57 MIL/uL (ref 4.22–5.81)
RDW: 15.3 % (ref 11.5–15.5)
WBC: 18.4 10*3/uL — ABNORMAL HIGH (ref 4.0–10.5)

## 2013-03-23 LAB — TSH: TSH: 1.199 u[IU]/mL (ref 0.350–4.500)

## 2013-03-23 LAB — MAGNESIUM: Magnesium: 1.8 mg/dL (ref 1.5–2.5)

## 2013-03-23 MED ORDER — VANCOMYCIN HCL IN DEXTROSE 1-5 GM/200ML-% IV SOLN
1000.0000 mg | Freq: Once | INTRAVENOUS | Status: AC
  Administered 2013-03-23: 1000 mg via INTRAVENOUS
  Filled 2013-03-23: qty 200

## 2013-03-23 MED ORDER — VANCOMYCIN HCL IN DEXTROSE 1-5 GM/200ML-% IV SOLN
1000.0000 mg | Freq: Three times a day (TID) | INTRAVENOUS | Status: DC
  Administered 2013-03-23 – 2013-03-26 (×9): 1000 mg via INTRAVENOUS
  Filled 2013-03-23 (×12): qty 200

## 2013-03-23 MED ORDER — ACETAMINOPHEN 650 MG RE SUPP: 650.0000 mg | Freq: Four times a day (QID) | RECTAL | Status: DC | PRN

## 2013-03-23 MED ORDER — PIPERACILLIN-TAZOBACTAM 3.375 G IVPB
3.3750 g | Freq: Three times a day (TID) | INTRAVENOUS | Status: DC
  Administered 2013-03-23 – 2013-03-24 (×3): 3.375 g via INTRAVENOUS
  Filled 2013-03-23 (×6): qty 50

## 2013-03-23 MED ORDER — ACETAMINOPHEN 325 MG PO TABS: 650.0000 mg | ORAL_TABLET | Freq: Four times a day (QID) | ORAL | Status: DC | PRN

## 2013-03-23 MED ORDER — SODIUM CHLORIDE 0.9 % IJ SOLN
3.0000 mL | Freq: Two times a day (BID) | INTRAMUSCULAR | Status: DC
  Administered 2013-03-24 – 2013-03-25 (×3): 3 mL via INTRAVENOUS

## 2013-03-23 MED ORDER — HYDROCODONE-ACETAMINOPHEN 5-325 MG PO TABS: 1.0000 | ORAL_TABLET | ORAL | Status: DC | PRN

## 2013-03-23 MED ORDER — SODIUM CHLORIDE 0.9 % IV BOLUS (SEPSIS)
500.0000 mL | Freq: Once | INTRAVENOUS | Status: AC
  Administered 2013-03-23: 500 mL via INTRAVENOUS

## 2013-03-23 MED ORDER — SALINE SPRAY 0.65 % NA SOLN
1.0000 | NASAL | Status: DC | PRN
  Administered 2013-03-23: 1 via NASAL
  Filled 2013-03-23: qty 44

## 2013-03-23 MED ORDER — DOCUSATE SODIUM 100 MG PO CAPS
100.0000 mg | ORAL_CAPSULE | Freq: Two times a day (BID) | ORAL | Status: DC
  Administered 2013-03-23: 100 mg via ORAL
  Filled 2013-03-23 (×11): qty 1

## 2013-03-23 MED ORDER — ONDANSETRON HCL 4 MG/2ML IJ SOLN
4.0000 mg | Freq: Four times a day (QID) | INTRAMUSCULAR | Status: DC | PRN
  Administered 2013-03-23: 4 mg via INTRAVENOUS
  Filled 2013-03-23: qty 2

## 2013-03-23 MED ORDER — GUAIFENESIN ER 600 MG PO TB12
600.0000 mg | ORAL_TABLET | Freq: Two times a day (BID) | ORAL | Status: DC
  Administered 2013-03-23 – 2013-03-26 (×7): 600 mg via ORAL
  Filled 2013-03-23 (×9): qty 1

## 2013-03-23 MED ORDER — RIVAROXABAN 20 MG PO TABS
20.0000 mg | ORAL_TABLET | Freq: Every day | ORAL | Status: DC
  Administered 2013-03-23 – 2013-03-25 (×3): 20 mg via ORAL
  Filled 2013-03-23 (×4): qty 1

## 2013-03-23 MED ORDER — VANCOMYCIN HCL 10 G IV SOLR
1500.0000 mg | Freq: Once | INTRAVENOUS | Status: AC
  Administered 2013-03-23: 1500 mg via INTRAVENOUS
  Filled 2013-03-23: qty 1500

## 2013-03-23 MED ORDER — IOHEXOL 350 MG/ML SOLN
80.0000 mL | Freq: Once | INTRAVENOUS | Status: AC | PRN
  Administered 2013-03-23: 80 mL via INTRAVENOUS

## 2013-03-23 MED ORDER — IOHEXOL 300 MG/ML  SOLN
100.0000 mL | Freq: Once | INTRAMUSCULAR | Status: AC | PRN
  Administered 2013-03-23: 100 mL via INTRAVENOUS

## 2013-03-23 MED ORDER — PIPERACILLIN-TAZOBACTAM 3.375 G IVPB
3.3750 g | Freq: Once | INTRAVENOUS | Status: DC
  Administered 2013-03-23: 3.375 g via INTRAVENOUS
  Filled 2013-03-23: qty 50

## 2013-03-23 MED ORDER — SODIUM CHLORIDE 0.9 % IV SOLN: INTRAVENOUS | Status: AC

## 2013-03-23 MED ORDER — ONDANSETRON HCL 4 MG PO TABS: 4.0000 mg | ORAL_TABLET | Freq: Four times a day (QID) | ORAL | Status: DC | PRN

## 2013-03-23 MED ORDER — POTASSIUM CHLORIDE CRYS ER 20 MEQ PO TBCR
40.0000 meq | EXTENDED_RELEASE_TABLET | Freq: Once | ORAL | Status: AC
  Administered 2013-03-23: 40 meq via ORAL
  Filled 2013-03-23: qty 2

## 2013-03-23 NOTE — ED Provider Notes (Signed)
CSN: [id number]     Arrival date & time 03/22/13  2056 History   First MD Initiated Contact with Patient 03/23/13 0112     Chief Complaint  Patient presents with  . Fever  . Cough  . Nausea   (Consider location/radiation/quality/duration/timing/severity/associated sxs/prior Treatment) Patient is a 52 y.o. male presenting with fever and cough. The history is provided by the patient. No language interpreter was used.  Fever Temp source:  Oral Severity:  Moderate Onset quality:  Gradual Timing:  Constant Progression:  Unchanged Chronicity:  New Relieved by:  Nothing Worsened by:  Nothing tried Ineffective treatments:  None tried Associated symptoms: cough, myalgias and nausea   Associated symptoms: no chest pain, no ear pain and no headaches   Cough Associated symptoms: fever and myalgias   Associated symptoms: no chest pain, no ear pain and no headaches     Past Medical History  Diagnosis Date  . Obesity   . Pulmonary embolism 05/04/2012    bilaterally  . Exertional dyspnea 05/04/2012    "isolated episode" (05/05/2012)  . Sleep apnea     "pretty sure; never been tested" (05/05/2012)  . History of chickenpox   . DVT (deep venous thrombosis)    Past Surgical History  Procedure Laterality Date  . No past surgeries     Family History  Problem Relation Age of Onset  . Diabetes Mother    History  Substance Use Topics  . Smoking status: Never Smoker   . Smokeless tobacco: Never Used  . Alcohol Use: No    Review of Systems  Constitutional: Positive for fever.  HENT: Negative for ear pain.   Respiratory: Positive for cough.   Cardiovascular: Negative for chest pain.  Gastrointestinal: Positive for nausea.  Musculoskeletal: Positive for myalgias.  Neurological: Negative for headaches.  All other systems reviewed and are negative.    Allergies  Review of patient's allergies indicates no known allergies.  Home Medications   Current Outpatient Rx  Name  Route   Sig  Dispense  Refill  . Multiple Vitamin (MULTIVITAMIN WITH MINERALS) TABS tablet   Oral   Take 1 tablet by mouth daily.         . Rivaroxaban (XARELTO) 20 MG TABS tablet   Oral   Take 20 mg by mouth daily.          BP 118/77  Pulse 77  Temp(Src) 99.7 F (37.6 C) (Oral)  Resp 28  Ht 6\' 2"  (1.88 m)  Wt 382 lb 7 oz (173.473 kg)  BMI 49.08 kg/m2  SpO2 97% Physical Exam  Constitutional: He is oriented to person, place, and time. He appears well-developed and well-nourished. No distress.  HENT:  Head: Normocephalic and atraumatic.  Mouth/Throat: Oropharynx is clear and moist.  Eyes: Conjunctivae are normal. Pupils are equal, round, and reactive to light.  Neck: Normal range of motion. Neck supple.  Cardiovascular: Normal rate, regular rhythm and intact distal pulses.   Pulmonary/Chest: Effort normal. He has decreased breath sounds.  Abdominal: Soft. Bowel sounds are normal. There is no tenderness. There is no rebound and no guarding.  Musculoskeletal: Normal range of motion. He exhibits edema.  Neurological: He is alert and oriented to person, place, and time.  Skin:  Cellulitis of the RLE shin and calf  Psychiatric: He has a normal mood and affect.    ED Course  Procedures (including critical care time) Labs Review Labs Reviewed  BASIC METABOLIC PANEL - Abnormal; Notable for the following:  Sodium 127 (*)    Potassium 3.2 (*)    Chloride 95 (*)    Glucose, Bld 154 (*)    GFR calc non Af Amer 77 (*)    GFR calc Af Amer 90 (*)    All other components within normal limits  CBC WITH DIFFERENTIAL - Abnormal; Notable for the following:    WBC 24.4 (*)    Neutrophils Relative % 86 (*)    Neutro Abs 21.0 (*)    Lymphocytes Relative 9 (*)    Monocytes Absolute 1.3 (*)    All other components within normal limits  URINALYSIS, ROUTINE W REFLEX MICROSCOPIC  CG4 I-STAT (LACTIC ACID)   Imaging Review Dg Chest 2 View  03/22/2013   CLINICAL DATA:  Fever, nausea  EXAM:  CHEST  2 VIEW  COMPARISON:  May 04, 2012  FINDINGS: The heart size and mediastinal contours are within normal limits. There is minor linear atelectasis of left lung base. No focal infiltrate, pulmonary edema, or pleural effusion is noted. The visualized skeletal structures are stable.  IMPRESSION: No active cardiopulmonary disease. Minor linear atelectasis of left lung base.   Electronically Signed   By: Sherian Rein M.D.   On: 03/22/2013 20:20   Ct Angio Chest Pe W/cm &/or Wo Cm  03/23/2013   CLINICAL DATA:  Pulmonary embolism with cough, nausea, possible dehydration.  EXAM: CT ANGIOGRAPHY CHEST WITH CONTRAST  TECHNIQUE: Multidetector CT imaging of the chest was performed using the standard protocol during bolus administration of intravenous contrast. Multiplanar CT image reconstructions including MIPs were obtained to evaluate the vascular anatomy.  CONTRAST:  80mL OMNIPAQUE IOHEXOL 350 MG/ML SOLN  COMPARISON:  CT of the chest for pulmonary embolism August 05, 2012 and May 04, 2012  FINDINGS: Mild respiratory degraded examination.  Main pulmonary artery is not enlarged, there is residual contrast in the superior vena cava which may limit evaluation for upper lobe pulmonary arterial filling defects. No definite pulmonary artery filling defects to the level of the subsegmental branches. Lungs are clear, no pulmonary nodules, masses, focal consolidations or pleural effusion though, respiratory motion the degrades sensitivity for small ground-glass nodules. Tracheobronchial tree is patent and midline.  The heart and pericardium are unremarkable. Subcentimeter pretracheal lymph node is unchanged, known mediastinal lymphadenopathy by CT size criteria. No pneumothorax. Aorta is normal in course and caliber. Included thoracic esophagus is unremarkable. Limited view of the abdomen demonstrates fatty liver. Soft tissues are nonsuspicious. Severe degenerative change of the right shoulder. Degenerative change of  the thoracic spine with mild scoliosis which could be positional.  Review of the MIP images confirms the above findings.  IMPRESSION: No evidence of pulmonary embolism nor acute cardiopulmonary process. Similar appearance of the chest from August 05, 2012.   Electronically Signed   By: Awilda Metro   On: 03/23/2013 02:35    EKG Interpretation   None       MDM  No diagnosis found. Cellulitis Concern for PNA though CT does not demonstrate this  Hyponatremia will need admission    Saidah Kempton K Verdie Barrows-Rasch, MD 03/23/13 [phone]

## 2013-03-23 NOTE — Progress Notes (Signed)
  Echocardiogram 2D Echocardiogram has been performed.  Evan Leonard FRANCES 03/23/2013, 10:30 AM

## 2013-03-23 NOTE — H&P (Signed)
PCP: Thomos Lemons, DO [facility]   Chief Complaint:  Fever leg swelling  HPI: Evan Leonard is a 52 y.o. male   has a past medical history of Obesity; Pulmonary embolism (05/04/2012); Exertional dyspnea (05/04/2012); Sleep apnea; History of chickenpox; and DVT (deep venous thrombosis).   Presented with  Has hx of DVT and PE December 2013 currently on xarelto. Yesterday he started to have chills, green sputum and one episode of vomiting. Generalized aches and pains. He has had a flue shot this year. He has been exposed to somebody with a cough  recently. In ER he was noted to have swollen Right leg.   Review of Systems:    Pertinent positives include: Fevers, chills, fatigue,  productive cough,  change in color of urine,   Constitutional:  No weight loss, night sweats,  weight loss  HEENT:  No headaches, Difficulty swallowing,Tooth/dental problems,Sore throat,  No sneezing, itching, ear ache, nasal congestion, post nasal drip,  Cardio-vascular:  No chest pain, Orthopnea, PND, anasarca, dizziness, palpitations.no Bilateral lower extremity swelling  GI:  No heartburn, indigestion, abdominal pain, nausea, vomiting, diarrhea, change in bowel habits, loss of appetite, melena, blood in stool, hematemesis Resp:  no  No dyspnea on exertion, No excess mucus, no  No non-productive cough, No coughing up of blood.No change in color of mucus.No wheezing. Skin:  no rash or lesions. No jaundice GU:  no dysuria,no urgency or frequency. No straining to urinate.  No flank pain.  Musculoskeletal:  No joint pain or no joint swelling. No decreased range of motion. No back pain.  Psych:  No change in mood or affect. No depression or anxiety. No memory loss.  Neuro: no localizing neurological complaints, no tingling, no weakness, no double vision, no gait abnormality, no slurred speech, no confusion  Otherwise ROS are negative except for above, 10 systems were reviewed  Past Medical History: Past Medical  History  Diagnosis Date  . Obesity   . Pulmonary embolism 05/04/2012    bilaterally  . Exertional dyspnea 05/04/2012    "isolated episode" (05/05/2012)  . Sleep apnea     "pretty sure; never been tested" (05/05/2012)  . History of chickenpox   . DVT (deep venous thrombosis)    Past Surgical History  Procedure Laterality Date  . No past surgeries       Medications: Prior to Admission medications   Medication Sig Start Date End Date Taking? Authorizing Provider  Multiple Vitamin (MULTIVITAMIN WITH MINERALS) TABS tablet Take 1 tablet by mouth daily.   Yes Historical Provider, MD  Rivaroxaban (XARELTO) 20 MG TABS tablet Take 20 mg by mouth daily.   Yes Historical Provider, MD    Allergies:  No Known Allergies  Social History:  Ambulatory   independently   Lives at  home   reports that he has never smoked. He has never used smokeless tobacco. He reports that he does not drink alcohol or use illicit drugs.   Family History: family history includes Diabetes in his mother.    Physical Exam: Patient Vitals for the past 24 hrs:  BP Temp Temp src Pulse Resp SpO2 Height Weight  03/23/13 0245 119/75 mmHg - - 88 21 98 % - -  03/23/13 0130 118/77 mmHg - - 77 - 97 % - -  03/23/13 0115 121/82 mmHg - - 82 - 98 % - -  03/23/13 0101 - - - - - 97 % - -  03/23/13 0100 131/75 mmHg - - 84 - 97 % - -  03/22/13 2130 119/67 mmHg 99.7 F (37.6 C) Oral 106 28 94 % 6\' 2"  (1.88 m) 173.473 kg (382 lb 7 oz)    1. General:  in No Acute distress 2. Psychological: Alert and *Oriented 3. Head/ENT:   Moist  Mucous Membranes                          Head Non traumatic, neck supple                          Normal  Dentition 4. SKIN: normal Skin turgor,  Skin clean Dry and intact right lower extremity significant for erythema and warmth \ 5. Heart: Regular rate and rhythm no Murmur, Rub or gallop 6. Lungs: Clear to auscultation bilaterally, no wheezes or crackles  distant breath sounds 7. Abdomen:  Soft, non-tender, Non distended severe to be 8. Lower extremities: no clubbing, cyanosis, trace edema 9. Neurologically Grossly intact, moving all 4 extremities equally 10. MSK: Normal range of motion  body mass index is 49.08 kg/(m^2).   Labs on Admission:   Recent Labs  03/22/13 2149  NA 127*  K 3.2*  CL 95*  CO2 20  GLUCOSE 154*  BUN 16  CREATININE 1.07  CALCIUM 9.2   No results found for this basename: AST, ALT, ALKPHOS, BILITOT, PROT, ALBUMIN,  in the last 72 hours No results found for this basename: LIPASE, AMYLASE,  in the last 72 hours  Recent Labs  03/22/13 2149  WBC 24.4*  NEUTROABS 21.0*  HGB 14.5  HCT 41.2  MCV 86.7  PLT 171   No results found for this basename: CKTOTAL, CKMB, CKMBINDEX, TROPONINI,  in the last 72 hours No results found for this basename: TSH, T4TOTAL, FREET3, T3FREE, THYROIDAB,  in the last 72 hours No results found for this basename: VITAMINB12, FOLATE, FERRITIN, TIBC, IRON, RETICCTPCT,  in the last 72 hours No results found for this basename: HGBA1C    Estimated Creatinine Clearance: 134 ml/min (by C-G formula based on Cr of 1.07). ABG No results found for this basename: phart, pco2, po2, hco3, tco2, acidbasedef, o2sat     Lab Results  Component Value Date   DDIMER 0.36 08/05/2012     UA hematouria   Cultures: No results found for this basename: sdes, specrequest, cult, reptstatus       Radiological Exams on Admission: Dg Chest 2 View  03/22/2013   CLINICAL DATA:  Fever, nausea  EXAM: CHEST  2 VIEW  COMPARISON:  May 04, 2012  FINDINGS: The heart size and mediastinal contours are within normal limits. There is minor linear atelectasis of left lung base. No focal infiltrate, pulmonary edema, or pleural effusion is noted. The visualized skeletal structures are stable.  IMPRESSION: No active cardiopulmonary disease. Minor linear atelectasis of left lung base.   Electronically Signed   By: Sherian Rein M.D.   On: 03/22/2013  20:20   Ct Angio Chest Pe W/cm &/or Wo Cm  03/23/2013   CLINICAL DATA:  Pulmonary embolism with cough, nausea, possible dehydration.  EXAM: CT ANGIOGRAPHY CHEST WITH CONTRAST  TECHNIQUE: Multidetector CT imaging of the chest was performed using the standard protocol during bolus administration of intravenous contrast. Multiplanar CT image reconstructions including MIPs were obtained to evaluate the vascular anatomy.  CONTRAST:  80mL OMNIPAQUE IOHEXOL 350 MG/ML SOLN  COMPARISON:  CT of the chest for pulmonary embolism August 05, 2012 and May 04, 2012  FINDINGS: Mild respiratory degraded examination.  Main pulmonary artery is not enlarged, there is residual contrast in the superior vena cava which may limit evaluation for upper lobe pulmonary arterial filling defects. No definite pulmonary artery filling defects to the level of the subsegmental branches. Lungs are clear, no pulmonary nodules, masses, focal consolidations or pleural effusion though, respiratory motion the degrades sensitivity for small ground-glass nodules. Tracheobronchial tree is patent and midline.  The heart and pericardium are unremarkable. Subcentimeter pretracheal lymph node is unchanged, known mediastinal lymphadenopathy by CT size criteria. No pneumothorax. Aorta is normal in course and caliber. Included thoracic esophagus is unremarkable. Limited view of the abdomen demonstrates fatty liver. Soft tissues are nonsuspicious. Severe degenerative change of the right shoulder. Degenerative change of the thoracic spine with mild scoliosis which could be positional.  Review of the MIP images confirms the above findings.  IMPRESSION: No evidence of pulmonary embolism nor acute cardiopulmonary process. Similar appearance of the chest from August 05, 2012.   Electronically Signed   By: Awilda Metro   On: 03/23/2013 02:35    Chart has been reviewed  Assessment/Plan  52 year old gentleman with history of morbid obesity lungs are  relatively history of PE about a year ago presenting with fever cough and right leg swelling Present on Admission:  . Cellulitis and abscess of leg - this is a more likely source of the fever will treat with IV antibiotic Zosyn and vancomycin. Given history of DVT we'll obtain Dopplers at this is doubtful his symptoms are more consistent with cellulitis  . Obstructive sleep apnea - will order CPAP  . Pulmonary embolism, hx of - continue xarelto, patient is at risk of pulmonary hypertension versus right-sided failure given history of obesity, DVT and PE, sleep apnea will obtain 2-D echo if able  . Leukocytosis - likely a combination of being hemoconcentrated, as well as cellulitis  . Hyponatremia - patient have had poor by mouth intake will obtain urine electrolytes give gentle IV fluid  . Hypokalemia - will replace  . Cough -  CXR not consistent with any evidence of pneumonia likely upper respiratory infection that this will Hematourea - will repeat UA to confirm if persistent may need to be worked up father by urology of note the patient is on Xarelto  Prophylaxis: xarelto Protonix  CODE STATUS: FULL CODE  Other plan as per orders.  I have spent a total of 55 min on this admission  Jannifer Fischler 03/23/2013, 4:02 AM

## 2013-03-23 NOTE — Progress Notes (Signed)
ANTIBIOTIC CONSULT NOTE - INITIAL  Pharmacy Consult for vancomycin Indication: cellulitis  No Known Allergies  Patient Measurements: Height: 6\' 2"  (188 cm) Weight: 381 lb 2.8 oz (172.9 kg) IBW/kg (Calculated) : 82.2  Vital Signs: Temp: 98.5 F (36.9 C) (10/28 0617) Temp src: Oral (10/28 0617) BP: 129/83 mmHg (10/28 0617) Pulse Rate: 86 (10/28 0617)  Labs:  Recent Labs  03/22/13 2149  WBC 24.4*  HGB 14.5  PLT 171  CREATININE 1.07   Estimated Creatinine Clearance: 133.8 ml/min (by C-G formula based on Cr of 1.07).   Microbiology: No results found for this or any previous visit (from the past 720 hour(s)).  Medical History: Past Medical History  Diagnosis Date  . Obesity   . Pulmonary embolism 05/04/2012    bilaterally  . Exertional dyspnea 05/04/2012    "isolated episode" (05/05/2012)  . Sleep apnea     "pretty sure; never been tested" (05/05/2012)  . History of chickenpox   . DVT (deep venous thrombosis)     Medications:  Prescriptions prior to admission  Medication Sig Dispense Refill  . Multiple Vitamin (MULTIVITAMIN WITH MINERALS) TABS tablet Take 1 tablet by mouth daily.      . Rivaroxaban (XARELTO) 20 MG TABS tablet Take 20 mg by mouth daily.       Scheduled:  . docusate sodium  100 mg Oral BID  . guaiFENesin  600 mg Oral BID  . piperacillin-tazobactam (ZOSYN)  IV  3.375 g Intravenous Q8H  . potassium chloride  40 mEq Oral Once  . Rivaroxaban  20 mg Oral Q supper  . sodium chloride  3 mL Intravenous Q12H  . vancomycin  1,500 mg Intravenous Once  . vancomycin  1,000 mg Intravenous Q8H   Infusions:  . sodium chloride      Assessment: 51yo male c/o generalized aches and pains w/ chills, fever thought to be d/t cellulitis of leg, to begin IV ABX.  Goal of Therapy:  Vancomycin trough level 10-15 mcg/ml  Plan:  Rec'd vanc 1g in ED; will give additional vancomycin 1.5g for total load of 2.5g then begin vancomycin 1000mg  IV Q8H and monitor CBC, Cx,  levels prn.  Vernard Gambles, PharmD, BCPS  03/23/2013,6:35 AM

## 2013-03-23 NOTE — ED Notes (Signed)
Pt c/o flu like symptoms since Saturday, was seen at urgent care

## 2013-03-23 NOTE — Progress Notes (Signed)
RT placed patient on CPAP per MD order but he immediately took the mask off. He said he can not tolerate it because he feels like he is being smothered. Patient said he will not wear the CPAP.

## 2013-03-23 NOTE — Progress Notes (Signed)
Bilateral lower extremity venous duplex:  No obvious evidence of DVT, superficial thrombosis, or Baker's Cyst.  Technically difficult study due to the patient's body habitus.   

## 2013-03-23 NOTE — Care Management Note (Unsigned)
    Page 1 of 1   03/23/2013     10:08:48 AM   CARE MANAGEMENT NOTE 03/23/2013  Patient:  Evan Leonard, Evan Leonard   Account Number:  [id number]  Date Initiated:  03/23/2013  Documentation initiated by:  Letha Cape  Subjective/Objective Assessment:   dx osa, cellulitis, hyponatremia  admit- lives alone.     Action/Plan:   Anticipated DC Date:  03/25/2013   Anticipated DC Plan:  HOME/SELF CARE      DC Planning Services  CM consult      Choice offered to / List presented to:             Status of service:  In process, will continue to follow Medicare Important Message given?   (If response is "NO", the following Medicare IM given date fields will be blank) Date Medicare IM given:   Date Additional Medicare IM given:    Discharge Disposition:    Per UR Regulation:  Reviewed for med. necessity/level of care/duration of stay  If discussed at Long Length of Stay Meetings, dates discussed:    Comments:

## 2013-03-23 NOTE — Progress Notes (Signed)
PATIENT DETAILS Name: Evan Leonard Age: 52 y.o. Sex: male Date of Birth: 1961/02/16 Admit Date: 03/23/2013 Admitting Physician Therisa Doyne, MD [id number] [name], DO  Subjective: No major issues-claims right leg essentially unchanged  Assessment/Plan: Active Problems: Cellulitis of the right leg -signficant erythema and swelling-dopplers neg (on Xarelto anyways) -unable to do MRI-will check CT leg to make sure no underlying abscess etc -c/w empiric Vanco/zosyn -already on antibiotics-will order blood cultures if febrile  Hx of Pulmonary embolism -c/w Xarelto -CT Angio on 10/28 neg for PE, Dopplers neg for DVT  OSA -c/w CPAP QHS  Hyponatremia -better -very mild-monitor and further work up only if worse  Disposition: Remain inpatient  DVT Prophylaxis: Not needed as on Xarelto  Code Status: Full code  Family Communication None at bedside  Procedures:  None  CONSULTS:  None   MEDICATIONS: Scheduled Meds: . docusate sodium  100 mg Oral BID  . guaiFENesin  600 mg Oral BID  . piperacillin-tazobactam (ZOSYN)  IV  3.375 g Intravenous Q8H  . Rivaroxaban  20 mg Oral Q supper  . sodium chloride  3 mL Intravenous Q12H  . vancomycin  1,000 mg Intravenous Q8H   Continuous Infusions: . sodium chloride     PRN Meds:.acetaminophen, acetaminophen, HYDROcodone-acetaminophen, ondansetron (ZOFRAN) IV, ondansetron, sodium chloride  Antibiotics: Anti-infectives   Start     Dose/Rate Route Frequency Ordered Stop   03/23/13 1400  piperacillin-tazobactam (ZOSYN) IVPB 3.375 g     3.375 g 12.5 mL/hr over 240 Minutes Intravenous 3 times per day 03/23/13 0610     03/23/13 1400  vancomycin (VANCOCIN) IVPB 1000 mg/200 mL premix     1,000 mg 200 mL/hr over 60 Minutes Intravenous Every 8 hours 03/23/13 0635     03/23/13 0630  vancomycin (VANCOCIN) 1,500 mg in sodium chloride 0.9 % 500 mL IVPB     1,500 mg 250 mL/hr over 120 Minutes Intravenous  Once 03/23/13 0625 03/23/13  1038   03/23/13 0245  vancomycin (VANCOCIN) IVPB 1000 mg/200 mL premix     1,000 mg 200 mL/hr over 60 Minutes Intravenous  Once 03/23/13 0240 03/23/13 0514   03/23/13 0245  piperacillin-tazobactam (ZOSYN) IVPB 3.375 g  Status:  Discontinued     3.375 g 12.5 mL/hr over 240 Minutes Intravenous  Once 03/23/13 0240 03/23/13 0610       PHYSICAL EXAM: Vital signs in last 24 hours: Filed Vitals:   03/23/13 0445 03/23/13 0500 03/23/13 0617 03/23/13 1449  BP: 116/84 130/74 129/83 118/71  Pulse: 80 85 86 100  Temp:   98.5 F (36.9 C) 99.1 F (37.3 C)  TempSrc:   Oral Oral  Resp:   18 18  Height:   6\' 2"  (1.88 m)   Weight:   172.9 kg (381 lb 2.8 oz)   SpO2: 97% 96% 96% 97%    Weight change:  Filed Weights   03/22/13 2130 03/23/13 0617  Weight: 173.473 kg (382 lb 7 oz) 172.9 kg (381 lb 2.8 oz)   Body mass index is 48.92 kg/(m^2).   Gen Exam: Awake and alert with clear speech.   Neck: Supple, No JVD.   Chest: B/L Clear.   CVS: S1 S2 Regular, no murmurs.  Abdomen: soft, BS +, non tender, non distended.  Extremities: no edema, lower extremities warm to touch.Right leg-significiant erythema and swelling-but not tense, sensation intact Neurologic: Non Focal.   Skin: No Rash. Wounds: N/A.    Intake/Output from previous day:  Intake/Output Summary (Last 24 hours) at 03/23/13 1545  Last data filed at 03/23/13 1450  Gross per 24 hour  Intake    462 ml  Output   1050 ml  Net   -588 ml     LAB RESULTS: CBC  Recent Labs Lab 03/22/13 2149 03/23/13 0628  WBC 24.4* 18.4*  HGB 14.5 13.5  HCT 41.2 39.8  PLT 171 162  MCV 86.7 87.1  MCH 30.5 29.5  MCHC 35.2 33.9  RDW 15.1 15.3  LYMPHSABS 2.1  --   MONOABS 1.3*  --   EOSABS 0.0  --   BASOSABS 0.0  --     Chemistries   Recent Labs Lab 03/22/13 2149 03/23/13 0628  NA 127* 130*  K 3.2* 3.4*  CL 95* 96  CO2 20 25  GLUCOSE 154* 163*  BUN 16 17  CREATININE 1.07 1.07  CALCIUM 9.2 8.8  MG  --  1.8    CBG: No  results found for this basename: GLUCAP,  in the last 168 hours  GFR Estimated Creatinine Clearance: 133.8 ml/min (by C-G formula based on Cr of 1.07).  Coagulation profile No results found for this basename: INR, PROTIME,  in the last 168 hours  Cardiac Enzymes No results found for this basename: CK, CKMB, TROPONINI, MYOGLOBIN,  in the last 168 hours  No components found with this basename: POCBNP,  No results found for this basename: DDIMER,  in the last 72 hours  Recent Labs  03/23/13 0628  HGBA1C 7.1*   No results found for this basename: CHOL, HDL, LDLCALC, TRIG, CHOLHDL, LDLDIRECT,  in the last 72 hours  Recent Labs  03/23/13 0628  TSH 1.199   No results found for this basename: VITAMINB12, FOLATE, FERRITIN, TIBC, IRON, RETICCTPCT,  in the last 72 hours No results found for this basename: LIPASE, AMYLASE,  in the last 72 hours  Urine Studies No results found for this basename: UACOL, UAPR, USPG, UPH, UTP, UGL, UKET, UBIL, UHGB, UNIT, UROB, ULEU, UEPI, UWBC, URBC, UBAC, CAST, CRYS, UCOM, BILUA,  in the last 72 hours  MICROBIOLOGY: No results found for this or any previous visit (from the past 240 hour(s)).  RADIOLOGY STUDIES/RESULTS: Dg Chest 2 View  03/22/2013   CLINICAL DATA:  Fever, nausea  EXAM: CHEST  2 VIEW  COMPARISON:  May 04, 2012  FINDINGS: The heart size and mediastinal contours are within normal limits. There is minor linear atelectasis of left lung base. No focal infiltrate, pulmonary edema, or pleural effusion is noted. The visualized skeletal structures are stable.  IMPRESSION: No active cardiopulmonary disease. Minor linear atelectasis of left lung base.   Electronically Signed   By: Sherian Rein M.D.   On: 03/22/2013 20:20   Ct Angio Chest Pe W/cm &/or Wo Cm  03/23/2013   CLINICAL DATA:  Pulmonary embolism with cough, nausea, possible dehydration.  EXAM: CT ANGIOGRAPHY CHEST WITH CONTRAST  TECHNIQUE: Multidetector CT imaging of the chest was  performed using the standard protocol during bolus administration of intravenous contrast. Multiplanar CT image reconstructions including MIPs were obtained to evaluate the vascular anatomy.  CONTRAST:  80mL OMNIPAQUE IOHEXOL 350 MG/ML SOLN  COMPARISON:  CT of the chest for pulmonary embolism August 05, 2012 and May 04, 2012  FINDINGS: Mild respiratory degraded examination.  Main pulmonary artery is not enlarged, there is residual contrast in the superior vena cava which may limit evaluation for upper lobe pulmonary arterial filling defects. No definite pulmonary artery filling defects to the level of the subsegmental branches. Lungs are clear,  no pulmonary nodules, masses, focal consolidations or pleural effusion though, respiratory motion the degrades sensitivity for small ground-glass nodules. Tracheobronchial tree is patent and midline.  The heart and pericardium are unremarkable. Subcentimeter pretracheal lymph node is unchanged, known mediastinal lymphadenopathy by CT size criteria. No pneumothorax. Aorta is normal in course and caliber. Included thoracic esophagus is unremarkable. Limited view of the abdomen demonstrates fatty liver. Soft tissues are nonsuspicious. Severe degenerative change of the right shoulder. Degenerative change of the thoracic spine with mild scoliosis which could be positional.  Review of the MIP images confirms the above findings.  IMPRESSION: No evidence of pulmonary embolism nor acute cardiopulmonary process. Similar appearance of the chest from August 05, 2012.   Electronically Signed   By: Awilda Metro   On: 03/23/2013 02:35    Jeoffrey Massed, MD  [facility] Pager:[phone]  If 7PM-7AM, please contact night-coverage [email or web address] Password [email or web address] 03/23/2013, 3:45 PM   LOS: 0 days

## 2013-03-24 DIAGNOSIS — R059 Cough, unspecified: Secondary | ICD-10-CM

## 2013-03-24 DIAGNOSIS — R05 Cough: Secondary | ICD-10-CM

## 2013-03-24 DIAGNOSIS — L02419 Cutaneous abscess of limb, unspecified: Principal | ICD-10-CM

## 2013-03-24 DIAGNOSIS — L03119 Cellulitis of unspecified part of limb: Principal | ICD-10-CM

## 2013-03-24 LAB — BASIC METABOLIC PANEL
BUN: 13 mg/dL (ref 6–23)
CO2: 22 mEq/L (ref 19–32)
Calcium: 8.8 mg/dL (ref 8.4–10.5)
Chloride: 99 mEq/L (ref 96–112)
Creatinine, Ser: 0.86 mg/dL (ref 0.50–1.35)
GFR calc Af Amer: >90 mL/min (ref >90)
GFR calc non Af Amer: >90 mL/min (ref >90)
Glucose, Bld: 181 mg/dL — ABNORMAL HIGH (ref 70–99)
Potassium: 3.5 mEq/L (ref 3.5–5.1)
Sodium: 131 mEq/L — ABNORMAL LOW (ref 135–145)

## 2013-03-24 LAB — CBC
HCT: 38.7 % — ABNORMAL LOW (ref 39.0–52.0)
Hemoglobin: 13.1 g/dL (ref 13.0–17.0)
MCH: 28.7 pg (ref 26.0–34.0)
MCHC: 33.9 g/dL (ref 30.0–36.0)
MCV: 84.9 fL (ref 78.0–100.0)
Platelets: 167 10*3/uL (ref 150–400)
RBC: 4.56 MIL/uL (ref 4.22–5.81)
RDW: 15.1 % (ref 11.5–15.5)
WBC: 13 10*3/uL — ABNORMAL HIGH (ref 4.0–10.5)

## 2013-03-24 LAB — URINALYSIS, ROUTINE W REFLEX MICROSCOPIC
Bilirubin Urine: NEGATIVE
Glucose, UA: NEGATIVE mg/dL
Ketones, ur: NEGATIVE mg/dL
Leukocytes, UA: NEGATIVE
Nitrite: NEGATIVE
Protein, ur: NEGATIVE mg/dL
Specific Gravity, Urine: 1.007 (ref 1.005–1.030)
Urobilinogen, UA: 0.2 mg/dL (ref 0.0–1.0)
pH: 5 (ref 5.0–8.0)

## 2013-03-24 LAB — CREATININE, URINE, RANDOM: Creatinine, Urine: 17.18 mg/dL

## 2013-03-24 LAB — URINE MICROSCOPIC-ADD ON

## 2013-03-24 LAB — SODIUM, URINE, RANDOM: Sodium, Ur: 116 mEq/L

## 2013-03-24 LAB — OSMOLALITY, URINE: Osmolality, Ur: 310 mOsm/kg — ABNORMAL LOW (ref 390–1090)

## 2013-03-24 MED ORDER — POTASSIUM CHLORIDE CRYS ER 20 MEQ PO TBCR
60.0000 meq | EXTENDED_RELEASE_TABLET | Freq: Once | ORAL | Status: AC
  Administered 2013-03-24: 60 meq via ORAL
  Filled 2013-03-24: qty 3

## 2013-03-24 MED ORDER — FUROSEMIDE 10 MG/ML IJ SOLN
40.0000 mg | Freq: Once | INTRAMUSCULAR | Status: AC
  Administered 2013-03-24: 40 mg via INTRAVENOUS
  Filled 2013-03-24: qty 4

## 2013-03-24 NOTE — Progress Notes (Signed)
PATIENT DETAILS Name: Evan Leonard Age: 52 y.o. Sex: male Date of Birth: December 23, 1960 Admit Date: 03/23/2013 Admitting Physician Evan Doyne, MD SAY:[id number] Evan Pais, DO  Subjective: Denies fever, chills and other complaints  Assessment/Plan: Active Problems:  Cellulitis of the right leg -Signficant erythema and swelling-dopplers neg (on Xarelto anyways) -Unable to do MRI-will check CT leg to make sure no underlying abscess etc -C/w empiric Vanco/zosyn -Already on antibiotics-will order blood cultures if febrile  Hx of Pulmonary embolism -c/w Xarelto -CT Angio on 10/28 neg for PE, Dopplers neg for DVT  LEUKOCYTOSIS -Marked leukocytosis with WBC count of 24.4 K. -Likely secondary to right lower cellulitis.   OSA -c/w CPAP QHS  Hyponatremia -Better -Very mild-monitor and further work up only if worse  Disposition: Remain inpatient  DVT Prophylaxis: Not needed as on Xarelto  Code Status: Full code  Family Communication None at bedside  Procedures:  None  CONSULTS:  None   MEDICATIONS: Scheduled Meds: . docusate sodium  100 mg Oral BID  . guaiFENesin  600 mg Oral BID  . Rivaroxaban  20 mg Oral Q supper  . sodium chloride  3 mL Intravenous Q12H  . vancomycin  1,000 mg Intravenous Q8H   Continuous Infusions:   PRN Meds:.acetaminophen, acetaminophen, HYDROcodone-acetaminophen, ondansetron (ZOFRAN) IV, ondansetron, sodium chloride  Antibiotics: Anti-infectives   Start     Dose/Rate Route Frequency Ordered Stop   03/23/13 1400  piperacillin-tazobactam (ZOSYN) IVPB 3.375 g  Status:  Discontinued     3.375 g 12.5 mL/hr over 240 Minutes Intravenous 3 times per day 03/23/13 0610 03/24/13 0910   03/23/13 1400  vancomycin (VANCOCIN) IVPB 1000 mg/200 mL premix     1,000 mg 200 mL/hr over 60 Minutes Intravenous Every 8 hours 03/23/13 0635     03/23/13 0630  vancomycin (VANCOCIN) 1,500 mg in sodium chloride 0.9 % 500 mL IVPB     1,500 mg 250 mL/hr over 120  Minutes Intravenous  Once 03/23/13 0625 03/23/13 1038   03/23/13 0245  vancomycin (VANCOCIN) IVPB 1000 mg/200 mL premix     1,000 mg 200 mL/hr over 60 Minutes Intravenous  Once 03/23/13 0240 03/23/13 0514   03/23/13 0245  piperacillin-tazobactam (ZOSYN) IVPB 3.375 g  Status:  Discontinued     3.375 g 12.5 mL/hr over 240 Minutes Intravenous  Once 03/23/13 0240 03/23/13 0610       PHYSICAL EXAM: Vital signs in last 24 hours: Filed Vitals:   03/23/13 0500 03/23/13 0617 03/23/13 1449 03/23/13 2103  BP: 130/74 129/83 118/71 107/64  Pulse: 85 86 100 101  Temp:  98.5 F (36.9 C) 99.1 F (37.3 C) 98.8 F (37.1 C)  TempSrc:  Oral Oral Oral  Resp:  18 18 18   Height:  6\' 2"  (1.88 m)    Weight:  172.9 kg (381 lb 2.8 oz)    SpO2: 96% 96% 97% 94%    Weight change:  Filed Weights   03/22/13 2130 03/23/13 0617  Weight: 173.473 kg (382 lb 7 oz) 172.9 kg (381 lb 2.8 oz)   Body mass index is 48.92 kg/(m^2).   Gen Exam: Awake and alert with clear speech.   Neck: Supple, No JVD.   Chest: B/L Clear.   CVS: S1 S2 Regular, no murmurs.  Abdomen: soft, BS +, non tender, non distended.  Extremities: no edema, lower extremities warm to touch.Right leg-significiant erythema and swelling-but not tense, sensation intact. Neurologic: Non Focal.   Skin: No Rash. Wounds: N/A.      Intake/Output from previous  day:  Intake/Output Summary (Last 24 hours) at 03/24/13 0954 Last data filed at 03/24/13 0939  Gross per 24 hour  Intake    822 ml  Output   2800 ml  Net  -1978 ml     LAB RESULTS: CBC  Recent Labs Lab 03/22/13 2149 03/23/13 0628 03/24/13 0540  WBC 24.4* 18.4* 13.0*  HGB 14.5 13.5 13.1  HCT 41.2 39.8 38.7*  PLT 171 162 167  MCV 86.7 87.1 84.9  MCH 30.5 29.5 28.7  MCHC 35.2 33.9 33.9  RDW 15.1 15.3 15.1  LYMPHSABS 2.1  --   --   MONOABS 1.3*  --   --   EOSABS 0.0  --   --   BASOSABS 0.0  --   --     Chemistries   Recent Labs Lab 03/22/13 2149 03/23/13 0628  03/24/13 0540  NA 127* 130* 131*  K 3.2* 3.4* 3.5  CL 95* 96 99  CO2 20 25 22   GLUCOSE 154* 163* 181*  BUN 16 17 13   CREATININE 1.07 1.07 0.86  CALCIUM 9.2 8.8 8.8  MG  --  1.8  --     CBG: No results found for this basename: GLUCAP,  in the last 168 hours  GFR Estimated Creatinine Clearance: 166.5 ml/min (by C-G formula based on Cr of 0.86).  Coagulation profile No results found for this basename: INR, PROTIME,  in the last 168 hours  Cardiac Enzymes No results found for this basename: CK, CKMB, TROPONINI, MYOGLOBIN,  in the last 168 hours  No components found with this basename: POCBNP,  No results found for this basename: DDIMER,  in the last 72 hours  Recent Labs  03/23/13 0628  HGBA1C 7.1*   No results found for this basename: CHOL, HDL, LDLCALC, TRIG, CHOLHDL, LDLDIRECT,  in the last 72 hours  Recent Labs  03/23/13 0628  TSH 1.199   No results found for this basename: VITAMINB12, FOLATE, FERRITIN, TIBC, IRON, RETICCTPCT,  in the last 72 hours No results found for this basename: LIPASE, AMYLASE,  in the last 72 hours  Urine Studies No results found for this basename: UACOL, UAPR, USPG, UPH, UTP, UGL, UKET, UBIL, UHGB, UNIT, UROB, ULEU, UEPI, UWBC, URBC, UBAC, CAST, CRYS, UCOM, BILUA,  in the last 72 hours  MICROBIOLOGY: No results found for this or any previous visit (from the past 240 hour(s)).  RADIOLOGY STUDIES/RESULTS: Dg Chest 2 View  03/22/2013   CLINICAL DATA:  Fever, nausea  EXAM: CHEST  2 VIEW  COMPARISON:  May 04, 2012  FINDINGS: The heart size and mediastinal contours are within normal limits. There is minor linear atelectasis of left lung base. No focal infiltrate, pulmonary edema, or pleural effusion is noted. The visualized skeletal structures are stable.  IMPRESSION: No active cardiopulmonary disease. Minor linear atelectasis of left lung base.   Electronically Signed   By: Sherian Rein M.D.   On: 03/22/2013 20:20   Ct Angio Chest Pe W/cm  &/or Wo Cm  03/23/2013   CLINICAL DATA:  Pulmonary embolism with cough, nausea, possible dehydration.  EXAM: CT ANGIOGRAPHY CHEST WITH CONTRAST  TECHNIQUE: Multidetector CT imaging of the chest was performed using the standard protocol during bolus administration of intravenous contrast. Multiplanar CT image reconstructions including MIPs were obtained to evaluate the vascular anatomy.  CONTRAST:  80mL OMNIPAQUE IOHEXOL 350 MG/ML SOLN  COMPARISON:  CT of the chest for pulmonary embolism August 05, 2012 and May 04, 2012  FINDINGS: Mild respiratory degraded  examination.  Main pulmonary artery is not enlarged, there is residual contrast in the superior vena cava which may limit evaluation for upper lobe pulmonary arterial filling defects. No definite pulmonary artery filling defects to the level of the subsegmental branches. Lungs are clear, no pulmonary nodules, masses, focal consolidations or pleural effusion though, respiratory motion the degrades sensitivity for small ground-glass nodules. Tracheobronchial tree is patent and midline.  The heart and pericardium are unremarkable. Subcentimeter pretracheal lymph node is unchanged, known mediastinal lymphadenopathy by CT size criteria. No pneumothorax. Aorta is normal in course and caliber. Included thoracic esophagus is unremarkable. Limited view of the abdomen demonstrates fatty liver. Soft tissues are nonsuspicious. Severe degenerative change of the right shoulder. Degenerative change of the thoracic spine with mild scoliosis which could be positional.  Review of the MIP images confirms the above findings.  IMPRESSION: No evidence of pulmonary embolism nor acute cardiopulmonary process. Similar appearance of the chest from August 05, 2012.   Electronically Signed   By: Awilda Metro   On: 03/23/2013 02:35    Clint Lipps, MD  [facility] Pager:[phone]  If 7PM-7AM, please contact night-coverage [email or web address] Password  [email or web address] 03/24/2013, 9:54 AM   LOS: 1 day

## 2013-03-25 LAB — URINE CULTURE
Colony Count: NO GROWTH
Culture: NO GROWTH

## 2013-03-25 LAB — BASIC METABOLIC PANEL
BUN: 12 mg/dL (ref 6–23)
CO2: 27 mEq/L (ref 19–32)
Calcium: 8.8 mg/dL (ref 8.4–10.5)
Chloride: 99 mEq/L (ref 96–112)
Creatinine, Ser: 0.78 mg/dL (ref 0.50–1.35)
GFR calc Af Amer: >90 mL/min (ref >90)
GFR calc non Af Amer: >90 mL/min (ref >90)
Glucose, Bld: 156 mg/dL — ABNORMAL HIGH (ref 70–99)
Potassium: 4.3 mEq/L (ref 3.5–5.1)
Sodium: 134 mEq/L — ABNORMAL LOW (ref 135–145)

## 2013-03-25 NOTE — Progress Notes (Signed)
PATIENT DETAILS Name: Evan Leonard Age: 52 y.o. Sex: male Date of Birth: [birth year] Admit Date: 03/23/2013 Admitting Physician Therisa Doyne, MD UJW:[id number] [name], DO  Subjective: Denies fever, chills and other complaints. Did walk around yesterday without a lot of pain.  Assessment/Plan: Active Problems:  Cellulitis of the right leg -Signficant erythema and swelling-dopplers neg (on Xarelto anyways) -Unable to do MRI-will check CT leg to make sure no underlying abscess etc -C/w empiric Vanco/zosyn -Overall improving, the redness is improving but the size of area of her femur is about the same as yesterday. -Continue IV antibiotics for today, evaluate for discharge in a.m.  Hx of Pulmonary embolism -c/w Xarelto -CT Angio on 10/28 neg for PE, Dopplers neg for DVT.  LEUKOCYTOSIS -Marked leukocytosis with WBC count of 24.4 K. -Likely secondary to right lower cellulitis.   OSA -c/w CPAP QHS  Hyponatremia -Better -Very mild-monitor and further work up only if worse. -Check BMP today, likely to repeat the Lasix today.  Disposition: Remain inpatient  DVT Prophylaxis: Not needed as on Xarelto  Code Status: Full code  Family Communication None at bedside  Procedures:  None  CONSULTS:  None   MEDICATIONS: Scheduled Meds: . docusate sodium  100 mg Oral BID  . guaiFENesin  600 mg Oral BID  . Rivaroxaban  20 mg Oral Q supper  . sodium chloride  3 mL Intravenous Q12H  . vancomycin  1,000 mg Intravenous Q8H   Continuous Infusions:   PRN Meds:.acetaminophen, acetaminophen, HYDROcodone-acetaminophen, ondansetron (ZOFRAN) IV, ondansetron, sodium chloride  Antibiotics: Anti-infectives   Start     Dose/Rate Route Frequency Ordered Stop   03/23/13 1400  piperacillin-tazobactam (ZOSYN) IVPB 3.375 g  Status:  Discontinued     3.375 g 12.5 mL/hr over 240 Minutes Intravenous 3 times per day 03/23/13 0610 03/24/13 0910   03/23/13 1400  vancomycin (VANCOCIN) IVPB  1000 mg/200 mL premix     1,000 mg 200 mL/hr over 60 Minutes Intravenous Every 8 hours 03/23/13 0635     03/23/13 0630  vancomycin (VANCOCIN) 1,500 mg in sodium chloride 0.9 % 500 mL IVPB     1,500 mg 250 mL/hr over 120 Minutes Intravenous  Once 03/23/13 0625 03/23/13 1038   03/23/13 0245  vancomycin (VANCOCIN) IVPB 1000 mg/200 mL premix     1,000 mg 200 mL/hr over 60 Minutes Intravenous  Once 03/23/13 0240 03/23/13 0514   03/23/13 0245  piperacillin-tazobactam (ZOSYN) IVPB 3.375 g  Status:  Discontinued     3.375 g 12.5 mL/hr over 240 Minutes Intravenous  Once 03/23/13 0240 03/23/13 0610       PHYSICAL EXAM: Vital signs in last 24 hours: Filed Vitals:   03/23/13 2103 03/24/13 1006 03/24/13 2048 03/25/13 0552  BP: 107/64 130/83 113/71 114/70  Pulse: 101 90 97 85  Temp: 98.8 F (37.1 C) 97.9 F (36.6 C) 99 F (37.2 C) 97.5 F (36.4 C)  TempSrc: Oral Oral Oral Oral  Resp: 18 18 17 18   Height:      Weight:      SpO2: 94% 98% 96% 95%    Weight change:  Filed Weights   03/22/13 2130 03/23/13 0617  Weight: 173.473 kg (382 lb 7 oz) 172.9 kg (381 lb 2.8 oz)   Body mass index is 48.92 kg/(m^2).   Gen Exam: Awake and alert with clear speech.   Neck: Supple, No JVD.   Chest: B/L Clear.   CVS: S1 S2 Regular, no murmurs.  Abdomen: soft, BS +, non tender, non distended.  Extremities: no edema, lower extremities warm to touch.Right leg-significiant erythema and swelling-but not tense, sensation intact. Neurologic: Non Focal.   Skin: No Rash. Wounds: N/A.      Intake/Output from previous day:  Intake/Output Summary (Last 24 hours) at 03/25/13 1034 Last data filed at 03/25/13 0552  Gross per 24 hour  Intake    240 ml  Output   2550 ml  Net  -2310 ml     LAB RESULTS: CBC  Recent Labs Lab 03/22/13 2149 03/23/13 0628 03/24/13 0540  WBC 24.4* 18.4* 13.0*  HGB 14.5 13.5 13.1  HCT 41.2 39.8 38.7*  PLT 171 162 167  MCV 86.7 87.1 84.9  MCH 30.5 29.5 28.7  MCHC  35.2 33.9 33.9  RDW 15.1 15.3 15.1  LYMPHSABS 2.1  --   --   MONOABS 1.3*  --   --   EOSABS 0.0  --   --   BASOSABS 0.0  --   --     Chemistries   Recent Labs Lab 03/22/13 2149 03/23/13 0628 03/24/13 0540  NA 127* 130* 131*  K 3.2* 3.4* 3.5  CL 95* 96 99  CO2 20 25 22   GLUCOSE 154* 163* 181*  BUN 16 17 13   CREATININE 1.07 1.07 0.86  CALCIUM 9.2 8.8 8.8  MG  --  1.8  --     CBG: No results found for this basename: GLUCAP,  in the last 168 hours  GFR Estimated Creatinine Clearance: 166.5 ml/min (by C-G formula based on Cr of 0.86).  Coagulation profile No results found for this basename: INR, PROTIME,  in the last 168 hours  Cardiac Enzymes No results found for this basename: CK, CKMB, TROPONINI, MYOGLOBIN,  in the last 168 hours  No components found with this basename: POCBNP,  No results found for this basename: DDIMER,  in the last 72 hours  Recent Labs  03/23/13 0628  HGBA1C 7.1*   No results found for this basename: CHOL, HDL, LDLCALC, TRIG, CHOLHDL, LDLDIRECT,  in the last 72 hours  Recent Labs  03/23/13 0628  TSH 1.199   No results found for this basename: VITAMINB12, FOLATE, FERRITIN, TIBC, IRON, RETICCTPCT,  in the last 72 hours No results found for this basename: LIPASE, AMYLASE,  in the last 72 hours  Urine Studies No results found for this basename: UACOL, UAPR, USPG, UPH, UTP, UGL, UKET, UBIL, UHGB, UNIT, UROB, ULEU, UEPI, UWBC, URBC, UBAC, CAST, CRYS, UCOM, BILUA,  in the last 72 hours  MICROBIOLOGY: No results found for this or any previous visit (from the past 240 hour(s)).  RADIOLOGY STUDIES/RESULTS: Dg Chest 2 View  03/22/2013   CLINICAL DATA:  Fever, nausea  EXAM: CHEST  2 VIEW  COMPARISON:  May 04, 2012  FINDINGS: The heart size and mediastinal contours are within normal limits. There is minor linear atelectasis of left lung base. No focal infiltrate, pulmonary edema, or pleural effusion is noted. The visualized skeletal  structures are stable.  IMPRESSION: No active cardiopulmonary disease. Minor linear atelectasis of left lung base.   Electronically Signed   By: Sherian Rein M.D.   On: 03/22/2013 20:20   Ct Angio Chest Pe W/cm &/or Wo Cm  03/23/2013   CLINICAL DATA:  Pulmonary embolism with cough, nausea, possible dehydration.  EXAM: CT ANGIOGRAPHY CHEST WITH CONTRAST  TECHNIQUE: Multidetector CT imaging of the chest was performed using the standard protocol during bolus administration of intravenous contrast. Multiplanar CT image reconstructions including MIPs were obtained to evaluate  the vascular anatomy.  CONTRAST:  80mL OMNIPAQUE IOHEXOL 350 MG/ML SOLN  COMPARISON:  CT of the chest for pulmonary embolism August 05, 2012 and May 04, 2012  FINDINGS: Mild respiratory degraded examination.  Main pulmonary artery is not enlarged, there is residual contrast in the superior vena cava which may limit evaluation for upper lobe pulmonary arterial filling defects. No definite pulmonary artery filling defects to the level of the subsegmental branches. Lungs are clear, no pulmonary nodules, masses, focal consolidations or pleural effusion though, respiratory motion the degrades sensitivity for small ground-glass nodules. Tracheobronchial tree is patent and midline.  The heart and pericardium are unremarkable. Subcentimeter pretracheal lymph node is unchanged, known mediastinal lymphadenopathy by CT size criteria. No pneumothorax. Aorta is normal in course and caliber. Included thoracic esophagus is unremarkable. Limited view of the abdomen demonstrates fatty liver. Soft tissues are nonsuspicious. Severe degenerative change of the right shoulder. Degenerative change of the thoracic spine with mild scoliosis which could be positional.  Review of the MIP images confirms the above findings.  IMPRESSION: No evidence of pulmonary embolism nor acute cardiopulmonary process. Similar appearance of the chest from August 05, 2012.    Electronically Signed   By: Awilda Metro   On: 03/23/2013 02:35    Clint Lipps, MD  [facility] Pager:[phone]  If 7PM-7AM, please contact night-coverage [email or web address] Password [email or web address] 03/25/2013, 10:34 AM   LOS: 2 days

## 2013-03-25 NOTE — Progress Notes (Signed)
Pt refusing CPAP tonight.  States he can not tolerate.  Currently stable on RA.  Notified Pt to have RN call us should he change his mind.

## 2013-03-26 DIAGNOSIS — Z6841 Body Mass Index (BMI) 40.0 and over, adult: Secondary | ICD-10-CM

## 2013-03-26 LAB — BASIC METABOLIC PANEL
BUN: 10 mg/dL (ref 6–23)
CO2: 27 mEq/L (ref 19–32)
Calcium: 8.8 mg/dL (ref 8.4–10.5)
Chloride: 99 mEq/L (ref 96–112)
Creatinine, Ser: 0.81 mg/dL (ref 0.50–1.35)
GFR calc Af Amer: >90 mL/min (ref >90)
GFR calc non Af Amer: >90 mL/min (ref >90)
Glucose, Bld: 211 mg/dL — ABNORMAL HIGH (ref 70–99)
Potassium: 4.4 mEq/L (ref 3.5–5.1)
Sodium: 134 mEq/L — ABNORMAL LOW (ref 135–145)

## 2013-03-26 LAB — CBC
HCT: 39.1 % (ref 39.0–52.0)
Hemoglobin: 13.4 g/dL (ref 13.0–17.0)
MCH: 29.7 pg (ref 26.0–34.0)
MCHC: 34.3 g/dL (ref 30.0–36.0)
MCV: 86.7 fL (ref 78.0–100.0)
Platelets: 204 10*3/uL (ref 150–400)
RBC: 4.51 MIL/uL (ref 4.22–5.81)
RDW: 14.7 % (ref 11.5–15.5)
WBC: 13.6 10*3/uL — ABNORMAL HIGH (ref 4.0–10.5)

## 2013-03-26 MED ORDER — SULFAMETHOXAZOLE-TRIMETHOPRIM 800-160 MG PO TABS: 1.0000 | ORAL_TABLET | Freq: Two times a day (BID) | ORAL | Status: DC

## 2013-03-26 MED ORDER — FUROSEMIDE 40 MG PO TABS
40.0000 mg | ORAL_TABLET | Freq: Once | ORAL | Status: AC
  Administered 2013-03-26: 40 mg via ORAL
  Filled 2013-03-26: qty 1

## 2013-03-26 MED ORDER — AMOXICILLIN 875 MG PO TABS: 875.0000 mg | ORAL_TABLET | Freq: Two times a day (BID) | ORAL | Status: DC

## 2013-03-26 NOTE — Discharge Summary (Signed)
Physician Discharge Summary  Evan Leonard ZHY:[id number] DOB: 06/24/1960 DOA: 03/23/2013  PCP: Evan Lemons, DO  Admit date: 03/23/2013 Discharge date: 03/26/2013  Time spent: 40 minutes  Recommendations for Outpatient Follow-up:  1. Followup with primary physician one week.  Discharge Diagnoses:  Active Problems:   Pulmonary embolism, hx of   Morbid obesity with body mass index of 40.0-44.9 in adult   Obstructive sleep apnea   Cellulitis and abscess of leg   Leukocytosis   Hyponatremia   Hypokalemia   Cough   Hematuria   Discharge Condition: Stable  Diet recommendation: Regular  Filed Weights   03/22/13 2130 03/23/13 0617  Weight: 173.473 kg (382 lb 7 oz) 172.9 kg (381 lb 2.8 oz)    History of present illness:  Evan Leonard is a 52 y.o. male  has a past medical history of Obesity; Pulmonary embolism (05/04/2012); Exertional dyspnea (05/04/2012); Sleep apnea; History of chickenpox; and DVT (deep venous thrombosis).  Presented with  Has hx of DVT and PE December 2013 currently on xarelto. Yesterday he started to have chills, green sputum and one episode of vomiting. Generalized aches and pains. He has had a flue shot this year. He has been exposed to somebody with a cough recently. In ER he was noted to have swollen Right leg.   Hospital Course:   1. Right lower extremity cellulitis: Patient presents with significant edema and swelling. Patient has history of lower extremity DVT he is on Xarelto. CT scan of the leg showed no evidence of abscess, left lower extremity Doppler ultrasound was done and showed no evidence of acute DVT. Patient was started on vancomycin and Zosyn empirically upon admission. Overall the redness and the cellulitis improved, there is still some redness on discharge, please see the attached ferritin, discharged on Bactrim and amoxicillin for 7 more days.  2. History of pulmonary embolism: The patient is on Xarelto, CT angiogram on 1028 negative for PE,  Dopplers negative for DVT.  3. Leukocytosis: Marked leukocytosis with WBC count of 24.4 admission, likely secondary to cellulitis. On discharge leukocytes count is 13.6.  4. OSA: Patient is on CPAP, that was continued throughout the hospital stay.  5. Hyponatremia: On admission sodium was 127, thought to be hypovolemic hyponatremia, Lasix was given and patient sodium improved to 134 on discharge.  Procedures:  Left lower extremity Doppler ultrasound was negative, done on 03/23/2013..  Consultations:  None  Discharge Exam: Filed Vitals:   03/26/13 0507  BP: 123/57  Pulse: 78  Temp: 98.4 F (36.9 C)  Resp: 20   General: Alert and awake, oriented x3, not in any acute distress. HEENT: anicteric sclera, pupils reactive to light and accommodation, EOMI CVS: S1-S2 clear, no murmur rubs or gallops Chest: clear to auscultation bilaterally, no wheezing, rales or rhonchi Abdomen: soft nontender, nondistended, normal bowel sounds, no organomegaly Extremities:  Left lower extremity redness and warmth, improved since admission. Look at the attached photograph Neuro: Cranial nerves II-XII intact, no focal neurological deficits  On admission    On Discharge      Discharge Instructions  Discharge Orders   Future Appointments Provider Department Dept Phone   04/05/2013 3:45 PM Evan Budge, MD [facility] [phone]   05/06/2013 2:30 PM Evan Leonard [facility] [phone]   05/06/2013 3:00 PM Evan Macho, MD [facility] [phone]   Future Orders Complete By Expires   Increase activity slowly  As directed  Medication List         amoxicillin 875 MG tablet  Commonly known as:  AMOXIL  Take 1 tablet (875 mg total) by mouth 2 (two) times daily.     multivitamin with minerals Tabs tablet  Take 1 tablet by mouth daily.     sulfamethoxazole-trimethoprim 800-160 MG per tablet  Commonly  known as:  BACTRIM DS  Take 1 tablet by mouth 2 (two) times daily.     XARELTO 20 MG Tabs tablet  Generic drug:  Rivaroxaban  Take 20 mg by mouth daily.       No Known Allergies     Follow-up Information   Follow up with Evan Lemons, DO In 1 week.   Specialty:  Internal Medicine   Contact information:   [address] [place] [address] [phone]        The results of significant diagnostics from this hospitalization (including imaging, microbiology, ancillary and laboratory) are listed below for reference.    Significant Diagnostic Studies: Dg Chest 2 View  03/22/2013   CLINICAL DATA:  Fever, nausea  EXAM: CHEST  2 VIEW  COMPARISON:  May 04, 2012  FINDINGS: The heart size and mediastinal contours are within normal limits. There is minor linear atelectasis of left lung base. No focal infiltrate, pulmonary edema, or pleural effusion is noted. The visualized skeletal structures are stable.  IMPRESSION: No active cardiopulmonary disease. Minor linear atelectasis of left lung base.   Electronically Signed   By: Sherian Rein M.D.   On: 03/22/2013 20:20   Ct Angio Chest Pe W/cm &/or Wo Cm  03/23/2013   CLINICAL DATA:  Pulmonary embolism with cough, nausea, possible dehydration.  EXAM: CT ANGIOGRAPHY CHEST WITH CONTRAST  TECHNIQUE: Multidetector CT imaging of the chest was performed using the standard protocol during bolus administration of intravenous contrast. Multiplanar CT image reconstructions including MIPs were obtained to evaluate the vascular anatomy.  CONTRAST:  80mL OMNIPAQUE IOHEXOL 350 MG/ML SOLN  COMPARISON:  CT of the chest for pulmonary embolism August 05, 2012 and May 04, 2012  FINDINGS: Mild respiratory degraded examination.  Main pulmonary artery is not enlarged, there is residual contrast in the superior vena cava which may limit evaluation for upper lobe pulmonary arterial filling defects. No definite pulmonary artery filling defects to the level of  the subsegmental branches. Lungs are clear, no pulmonary nodules, masses, focal consolidations or pleural effusion though, respiratory motion the degrades sensitivity for small ground-glass nodules. Tracheobronchial tree is patent and midline.  The heart and pericardium are unremarkable. Subcentimeter pretracheal lymph node is unchanged, known mediastinal lymphadenopathy by CT size criteria. No pneumothorax. Aorta is normal in course and caliber. Included thoracic esophagus is unremarkable. Limited view of the abdomen demonstrates fatty liver. Soft tissues are nonsuspicious. Severe degenerative change of the right shoulder. Degenerative change of the thoracic spine with mild scoliosis which could be positional.  Review of the MIP images confirms the above findings.  IMPRESSION: No evidence of pulmonary embolism nor acute cardiopulmonary process. Similar appearance of the chest from August 05, 2012.   Electronically Signed   By: Awilda Metro   On: 03/23/2013 02:35   Ct Tibia Fibula Right W Contrast  03/23/2013   CLINICAL DATA:  Cellulitis. Abscess.  EXAM: CT OF THE RIGHT TIBIA AND FIBULA WITH CONTRAST  TECHNIQUE: Multidetector CT imaging of the right leg was performed according to the standard protocol following intravenous contrast administration. Multiplanar CT image reconstructions were also generated.  CONTRAST:  OMNIPAQUE IOHEXOL 300 MG/ML  SOLN  COMPARISON:  None.  FINDINGS: There is diffuse infiltration of the subcutaneous tissues, greater on the right than left. Enlargement of the right leg relative to the left. There is no focal soft tissue abscess or drainable fluid collection. The soft tissue infiltration is circumferential. There is no deep fluid collection identified. Neurovascular bundles appear within normal limits. Negative for osteomyelitis. Prominent edema a in is present in the subcutaneous tissues around the ankle. Reactive right popliteal fossa lymph node present. Mild lateral  compartment osteoarthritis of the left knee is incidentally noted. Faintly calcified loose bodies are present in the anterior lateral compartment of the left knee. Right knee patellofemoral osteoarthritis is present. Small uncomplicated right knee Baker's cyst.  IMPRESSION: Diffuse right-greater-than-left subcutaneous infiltration, compatible with cellulitis in the appropriate clinical setting. Negative for soft tissue abscess, deep fluid collection or osteomyelitis.   Electronically Signed   By: Andreas Newport M.D.   On: 03/23/2013 20:44    Microbiology: Recent Results (from the past 240 hour(s))  URINE CULTURE     Status: None   Collection Time    03/24/13  3:58 PM      Result Value Range Status   Specimen Description URINE, RANDOM   Final   Special Requests NONE   Final   Culture  Setup Time     Final   Value: 03/25/2013 03:05     Performed at [facility] Count     Final   Value: NO GROWTH     Performed at [facility]   Culture     Final   Value: NO GROWTH     Performed at [facility]   Report Status 03/25/2013 FINAL   Final     Labs: Basic Metabolic Panel:  Recent Labs Lab 03/22/13 2149 03/23/13 0628 03/24/13 0540 03/25/13 1115 03/26/13 0604  NA 127* 130* 131* 134* 134*  K 3.2* 3.4* 3.5 4.3 4.4  CL 95* 96 99 99 99  CO2 20 25 22 27 27   GLUCOSE 154* 163* 181* 156* 211*  BUN 16 17 13 12 10   CREATININE 1.07 1.07 0.86 0.78 0.81  CALCIUM 9.2 8.8 8.8 8.8 8.8  MG  --  1.8  --   --   --   PHOS  --  1.7*  --   --   --    Liver Function Tests:  Recent Labs Lab 03/23/13 0321 03/23/13 0628  AST 18 19  ALT 18 17  ALKPHOS 69 74  BILITOT 1.0 1.0  PROT 7.6 7.3  ALBUMIN 2.9* 2.8*   No results found for this basename: LIPASE, AMYLASE,  in the last 168 hours No results found for this basename: AMMONIA,  in the last 168 hours CBC:  Recent Labs Lab 03/22/13 2149 03/23/13 0628 03/24/13 0540 03/26/13 0604  WBC 24.4* 18.4* 13.0* 13.6*   NEUTROABS 21.0*  --   --   --   HGB 14.5 13.5 13.1 13.4  HCT 41.2 39.8 38.7* 39.1  MCV 86.7 87.1 84.9 86.7  PLT 171 162 167 204   Cardiac Enzymes: No results found for this basename: CKTOTAL, CKMB, CKMBINDEX, TROPONINI,  in the last 168 hours BNP: BNP (last 3 results)  Recent Labs  05/04/12 1820  PROBNP 216.3*   CBG: No results found for this basename: GLUCAP,  in the last 168 hours     Signed:  Deby Adger A  [facility] 03/26/2013, 10:12 AM

## 2013-03-26 NOTE — Progress Notes (Signed)
Nsg Discharge Note  Admit Date:  03/23/2013 Discharge date: 03/26/2013   [name] to be D/C'd Home per MD order.  AVS completed.  Copy for chart, and copy for patient signed, and dated. Patient/caregiver able to verbalize understanding.  Discharge Medication:   Medication List         amoxicillin 875 MG tablet  Commonly known as:  AMOXIL  Take 1 tablet (875 mg total) by mouth 2 (two) times daily.     multivitamin with minerals Tabs tablet  Take 1 tablet by mouth daily.     sulfamethoxazole-trimethoprim 800-160 MG per tablet  Commonly known as:  BACTRIM DS  Take 1 tablet by mouth 2 (two) times daily.     XARELTO 20 MG Tabs tablet  Generic drug:  Rivaroxaban  Take 20 mg by mouth daily.        Discharge Assessment: Filed Vitals:   03/26/13 0507  BP: 123/57  Pulse: 78  Temp: 98.4 F (36.9 C)  Resp: 20   Skin clean, dry and intact without evidence of skin break down, no evidence of skin tears noted. IV catheter discontinued intact. Site without signs and symptoms of complications - no redness or edema noted at insertion site, patient denies c/o pain - only slight tenderness at site.  Dressing with slight pressure applied. Redness noted on RLE/cellulitis site. No complaints of discomfort at this time.   D/c Instructions-Education: Discharge instructions given to patient/family with verbalized understanding. D/c education completed with patient/family including follow up instructions, medication list, d/c activities limitations if indicated, with other d/c instructions as indicated by MD - patient able to verbalize understanding, all questions fully answered. Patient instructed to return to ED, call 911, or call MD for any changes in condition.  Patient escorted via WC, and D/C home via private auto.  Kern Reap, RN 03/26/2013 2:50 PM

## 2013-03-30 ENCOUNTER — Telehealth: Payer: Self-pay | Admitting: *Deleted

## 2013-03-30 NOTE — Telephone Encounter (Signed)
Appointment given.

## 2013-03-30 NOTE — ED Provider Notes (Signed)
Medical screening examination/treatment/procedure(s) were performed by resident physician or non-physician practitioner and as supervising physician I was immediately available for consultation/collaboration.   Ailea Rhatigan DOUGLAS MD.   Rhetta Cleek D Beldon Nowling, MD 03/30/13 1011 

## 2013-03-31 ENCOUNTER — Encounter: Payer: Self-pay | Admitting: Internal Medicine

## 2013-03-31 ENCOUNTER — Ambulatory Visit (INDEPENDENT_AMBULATORY_CARE_PROVIDER_SITE_OTHER): Payer: PRIVATE HEALTH INSURANCE | Admitting: Internal Medicine

## 2013-03-31 VITALS — BP 124/80 | HR 108 | Temp 99.3°F | Wt 382.0 lb

## 2013-03-31 DIAGNOSIS — L02419 Cutaneous abscess of limb, unspecified: Secondary | ICD-10-CM

## 2013-03-31 DIAGNOSIS — E871 Hypo-osmolality and hyponatremia: Secondary | ICD-10-CM

## 2013-03-31 DIAGNOSIS — L03119 Cellulitis of unspecified part of limb: Secondary | ICD-10-CM

## 2013-03-31 DIAGNOSIS — Z23 Encounter for immunization: Secondary | ICD-10-CM

## 2013-03-31 DIAGNOSIS — IMO0001 Reserved for inherently not codable concepts without codable children: Secondary | ICD-10-CM

## 2013-03-31 DIAGNOSIS — G4733 Obstructive sleep apnea (adult) (pediatric): Secondary | ICD-10-CM

## 2013-03-31 DIAGNOSIS — E1165 Type 2 diabetes mellitus with hyperglycemia: Secondary | ICD-10-CM | POA: Insufficient documentation

## 2013-03-31 MED ORDER — GLUCOSE BLOOD VI STRP: ORAL_STRIP | Status: DC

## 2013-03-31 MED ORDER — DOXYCYCLINE HYCLATE 100 MG PO TABS: 100.0000 mg | ORAL_TABLET | Freq: Two times a day (BID) | ORAL | Status: DC

## 2013-03-31 MED ORDER — METFORMIN HCL 500 MG PO TABS: 500.0000 mg | ORAL_TABLET | Freq: Two times a day (BID) | ORAL | Status: DC

## 2013-03-31 MED ORDER — FREESTYLE LANCETS MISC: Status: DC

## 2013-03-31 NOTE — Patient Instructions (Addendum)
Please complete the following lab tests before your next follow up appointment: BMET - 276.1 CBCD - 682.6 Elevate your feet 3 x daily as directed Please contact our office if your right leg redness or swelling gets worse. Seek emergency medical care if you develop high fever or chills

## 2013-03-31 NOTE — Assessment & Plan Note (Addendum)
Cellulitis of right leg improved but not resolved. Patient instructed to start doxycycline 100 mg twice daily for 10 days after he finishes his Bactrim and amoxicillin.  Patient also encouraged to elevate his feet 3 times daily.  Patient advised to call office if symptoms persist or worsen.

## 2013-03-31 NOTE — Assessment & Plan Note (Signed)
Patient has new onset type 2 diabetes uncontrolled. His A1c is 7.1. Refer to diabetic educator. Referred to an ophthalmologist for diabetic eye exam. I stressed importance of carb modified diet. Patient will try to limit his carbohydrate intake to 30 g per meal. New glucometer provided with nursing instruction. Start metformin 500 mg twice daily.

## 2013-03-31 NOTE — Progress Notes (Signed)
Subjective:    Patient ID: Evan Leonard, male    DOB: [birth year], 52 y.o.   MRN: [id number]  HPI  52 year old white male with history of pulmonary embolism and morbid obesity with sleep apnea Here for hospital followup. Patient admitted on 03/22/2013 secondary to acute generalized aches and pains but chills and fever. Patient found to have significant cellulitis of right leg. Patient treated with IV antibiotics-vancomycin and Zosyn during his hospitalization. He was discharged on combination of Bactrim and amoxicillin 875 mg. Patient reports his right leg has improved but he still has persistent redness and swelling. He denies fever chills.  Patient's underwent CT and by mouth on October 28. He was negative for PE. He also had venous Dopplers of his lower remedy which were negative for DVT.  Patient also has unexplained mild hyponatremia. His sodium levels were 130. Hospitalist recommended monitoring for now and further workup if it worsens.  Patient was maintained on Zaroxolyn. He plans to followup with Dr. Myna Hidalgo.  Patient has been on anticoagulation for approximately one year.  During his hospitalization patient found to have elevated blood sugars his A1c was 7.1.  Patient currently not following low-carb diet. He denies polyuria polydipsia   Review of Systems  Negative for fever or chills, right leg swelling.  No chest pain,  No paraesthesias  Past Medical History  Diagnosis Date  . Obesity   . Pulmonary embolism 05/04/2012    bilaterally  . Exertional dyspnea 05/04/2012    "isolated episode" (05/05/2012)  . Sleep apnea     "pretty sure; never been tested" (05/05/2012)  . History of chickenpox   . DVT (deep venous thrombosis)     History   Social History  . Marital Status: Single    Spouse Name: N/A    Number of Children: N/A  . Years of Education: N/A   Occupational History  . [employer]   Social History Main Topics  . Smoking status: Never  Smoker   . Smokeless tobacco: Never Used  . Alcohol Use: No  . Drug Use: No  . Sexual Activity: Not on file   Other Topics Concern  . Not on file   Social History Narrative  . No narrative on file    Past Surgical History  Procedure Laterality Date  . No past surgeries      Family History  Problem Relation Age of Onset  . Diabetes Mother     No Known Allergies  Current Outpatient Prescriptions on File Prior to Visit  Medication Sig Dispense Refill  . Multiple Vitamin (MULTIVITAMIN WITH MINERALS) TABS tablet Take 1 tablet by mouth daily.      . Rivaroxaban (XARELTO) 20 MG TABS tablet Take 20 mg by mouth daily.       No current facility-administered medications on file prior to visit.    BP 124/80  Pulse 108  Temp(Src) 99.3 F (37.4 C) (Oral)  Wt 382 lb (173.274 kg)  SpO2 97%       Objective:   Physical Exam  Constitutional: He is oriented to person, place, and time. He appears well-developed and well-nourished. No distress.  HENT:  Head: Normocephalic and atraumatic.  Mouth/Throat: Oropharynx is clear and moist.  Neck: Neck supple.  Cardiovascular: Normal rate, regular rhythm and normal heart sounds.   No murmur heard. Pulmonary/Chest: Effort normal and breath sounds normal. He has no wheezes.  Abdominal: Soft. Bowel sounds are normal. There is no tenderness.  Musculoskeletal: He exhibits  tenderness.  Significant right lower remedy edema 2+, erythema and warmth up to the mid calf.  Lymphadenopathy:    He has no cervical adenopathy.  Neurological: He is alert and oriented to person, place, and time. No cranial nerve deficit.  Psychiatric: He has a normal mood and affect. His behavior is normal.          Assessment & Plan:

## 2013-03-31 NOTE — Assessment & Plan Note (Signed)
Patient has mild hyponatremia. Monitor for now. If he has persistent hyponatremia, we discussed initiating workup for SIADH. Patient appears normovolemic.  He may have SIADH.

## 2013-03-31 NOTE — Assessment & Plan Note (Signed)
Patient reports having difficulty tolerating CPAP. Patient encouraged to followup with Dr. Maple Hudson. I stressed importance of weight loss. I suspect he is requiring higher level of pressure for CPAP.

## 2013-04-02 ENCOUNTER — Other Ambulatory Visit: Payer: PRIVATE HEALTH INSURANCE

## 2013-04-05 ENCOUNTER — Encounter: Payer: Self-pay | Admitting: Internal Medicine

## 2013-04-05 ENCOUNTER — Ambulatory Visit (INDEPENDENT_AMBULATORY_CARE_PROVIDER_SITE_OTHER): Payer: PRIVATE HEALTH INSURANCE | Admitting: Internal Medicine

## 2013-04-05 VITALS — BP 116/76 | HR 110 | Ht 74.0 in | Wt 374.6 lb

## 2013-04-05 DIAGNOSIS — Z6841 Body Mass Index (BMI) 40.0 and over, adult: Secondary | ICD-10-CM

## 2013-04-05 DIAGNOSIS — G4733 Obstructive sleep apnea (adult) (pediatric): Secondary | ICD-10-CM

## 2013-04-05 NOTE — Progress Notes (Signed)
02/15/13- 33 yoM never smoker referred courtesy of Dr [name]; sleep study attached.  NPSG 12/10/12- Severe OSA AHI 87.3/ hr, weight 377 lbs Complains of difficulty sleeping soundly, of waking frequently. Sleeps better sitting up than lying down. Bedtime 11 PM with sleep latency about 15 minutes. Wakes every 2 hours until gets up at 4:45 AM. Breathes easier sitting up. Dozes off easily if sitting quietly. Snores loudly. Lives alone. No ENT surgery. Medical history of pulmonary embolism December 2013. 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 Father died old age in his 3s, mother died of complications of diabetes.  04/05/13- 53 yoM never smoker referred courtesy of Dr [name];   NPSG 12/10/12- Severe OSA AHI 87.3/ hr, weight 377 lbs FOLLOWS FOR: DME is [facility]; feels like pressure is too high Since last here was hosp with cellulitis R leg (no clot) and acute bronchitis treated doxycycline. Continues Xarelto. CPAP AutoPAP/ [facility] pressure feels too high. He feels smothered and can't keep the mask on.  ROS-see HPI Constitutional:   No-   weight loss, night sweats, fevers, chills, +fatigue, lassitude. HEENT:   No-  headaches, difficulty swallowing, tooth/dental problems, sore throat,       No-  sneezing, itching, ear ache, nasal congestion, post nasal drip,  CV:  No-   chest pain, orthopnea, PND, swelling in lower extremities, anasarca, dizziness, palpitations Resp: No-   shortness of breath with exertion or at rest.              No-   productive cough,  No non-productive cough,  No- coughing up of blood.              No-   change in color of mucus.  No- wheezing.   Skin: No-   rash or lesions. GI:  No-   heartburn, indigestion, abdominal pain, nausea, vomiting,  GU:  MS:  No-   joint pain or swelling.   Neuro-     nothing unusual Psych:  No- change in mood or affect. No depression or anxiety.  No memory loss.  OBJ- Physical Exam General- Alert, Oriented, Affect-appropriate, Distress- none acute,  obese Skin- rash-none, lesions- none, excoriation- none Lymphadenopathy- none Head- atraumatic            Eyes- Gross vision intact, PERRLA, conjunctivae and secretions clear            Ears- Hearing, canals-normal            Nose- Clear, no-Septal dev, mucus, polyps, erosion, perforation             Throat- Mallampati IV , mucosa clear , drainage- none, tonsils- atrophic Neck- flexible , trachea midline, no stridor , thyroid nl, carotid no bruit Chest - symmetrical excursion , unlabored           Heart/CV- RRR , no murmur , no gallop  , no rub, nl s1 s2                           - JVD- none , edema- none, stasis changes+, varices- none           Lung- clear to P&A, wheeze- none, cough- none , dullness-none, rub- none           Chest wall-  Abd-  Br/ Gen/ Rectal- Not done, not indicated Extrem- cyanosis- none, clubbing, none, atrophy- none, strength- nl. +Very heavy legs,  Neuro- grossly intact to observation

## 2013-04-05 NOTE — Patient Instructions (Signed)
Order- Christoper Allegra- change auto titration range on CPAP to 5-15 x 7 days  Patient needs help with comfort to improve tolerance.  Dx OSA  Keep trying to lose weight- it's important

## 2013-04-06 ENCOUNTER — Other Ambulatory Visit (INDEPENDENT_AMBULATORY_CARE_PROVIDER_SITE_OTHER): Payer: PRIVATE HEALTH INSURANCE

## 2013-04-06 DIAGNOSIS — IMO0001 Reserved for inherently not codable concepts without codable children: Secondary | ICD-10-CM

## 2013-04-06 DIAGNOSIS — E1165 Type 2 diabetes mellitus with hyperglycemia: Secondary | ICD-10-CM

## 2013-04-06 LAB — BASIC METABOLIC PANEL
BUN: 17 mg/dL (ref 6–23)
CO2: 28 mEq/L (ref 19–32)
Calcium: 9.5 mg/dL (ref 8.4–10.5)
Chloride: 101 mEq/L (ref 96–112)
Creatinine, Ser: 1 mg/dL (ref 0.4–1.5)
GFR: 87.84 mL/min (ref >60.00)
Glucose, Bld: 92 mg/dL (ref 70–99)
Potassium: 4.4 mEq/L (ref 3.5–5.1)
Sodium: 137 mEq/L (ref 135–145)

## 2013-04-06 LAB — CBC WITH DIFFERENTIAL/PLATELET
Basophils Absolute: 0.1 10*3/uL (ref 0.0–0.1)
Basophils Relative: 0.7 % (ref 0.0–3.0)
Eosinophils Absolute: 0.4 10*3/uL (ref 0.0–0.7)
Eosinophils Relative: 4.1 % (ref 0.0–5.0)
HCT: 40.7 % (ref 39.0–52.0)
Hemoglobin: 13.6 g/dL (ref 13.0–17.0)
Lymphocytes Relative: 28.8 % (ref 12.0–46.0)
Lymphs Abs: 2.8 10*3/uL (ref 0.7–4.0)
MCHC: 33.3 g/dL (ref 30.0–36.0)
MCV: 85.5 fl (ref 78.0–100.0)
Monocytes Absolute: 1 10*3/uL (ref 0.1–1.0)
Monocytes Relative: 10.8 % (ref 3.0–12.0)
Neutro Abs: 5.3 10*3/uL (ref 1.4–7.7)
Neutrophils Relative %: 55.6 % (ref 43.0–77.0)
Platelets: 346 10*3/uL (ref 150.0–400.0)
RBC: 4.76 Mil/uL (ref 4.22–5.81)
RDW: 15.2 % — ABNORMAL HIGH (ref 11.5–14.6)
WBC: 9.6 10*3/uL (ref 4.5–10.5)

## 2013-04-06 LAB — MICROALBUMIN / CREATININE URINE RATIO
Creatinine,U: 199.4 mg/dL
Microalb Creat Ratio: 0.5 mg/g (ref 0.0–30.0)
Microalb, Ur: 0.9 mg/dL (ref 0.0–1.9)

## 2013-04-07 ENCOUNTER — Encounter: Payer: Self-pay | Admitting: Internal Medicine

## 2013-04-07 ENCOUNTER — Ambulatory Visit (INDEPENDENT_AMBULATORY_CARE_PROVIDER_SITE_OTHER): Payer: PRIVATE HEALTH INSURANCE | Admitting: Internal Medicine

## 2013-04-07 VITALS — BP 124/82 | Temp 98.9°F | Ht 74.0 in | Wt 374.0 lb

## 2013-04-07 DIAGNOSIS — L03119 Cellulitis of unspecified part of limb: Secondary | ICD-10-CM

## 2013-04-07 DIAGNOSIS — IMO0001 Reserved for inherently not codable concepts without codable children: Secondary | ICD-10-CM

## 2013-04-07 DIAGNOSIS — E871 Hypo-osmolality and hyponatremia: Secondary | ICD-10-CM

## 2013-04-07 DIAGNOSIS — L02419 Cutaneous abscess of limb, unspecified: Secondary | ICD-10-CM

## 2013-04-07 DIAGNOSIS — E1165 Type 2 diabetes mellitus with hyperglycemia: Secondary | ICD-10-CM

## 2013-04-07 MED ORDER — FREESTYLE LANCETS MISC: Status: DC

## 2013-04-07 MED ORDER — GLUCOSE BLOOD VI STRP: ORAL_STRIP | Status: DC

## 2013-04-07 NOTE — Progress Notes (Signed)
Pre-visit discussion using our clinic review tool. No additional management support is needed unless otherwise documented below in the visit note.  

## 2013-04-08 NOTE — Assessment & Plan Note (Signed)
Improving.  Finish doxycycline.  1/2 day schedule at work for additional two weeks.  Elevate legs 2-3 times per day as directed.   Lab Results  Component Value Date   WBC 9.6 04/06/2013   HGB 13.6 04/06/2013   HCT 40.7 04/06/2013   MCV 85.5 04/06/2013   PLT 346.0 04/06/2013

## 2013-04-08 NOTE — Assessment & Plan Note (Signed)
Improved with mid fluid restriction.  Patient advised to resume intake of 5-6 glasses of water per day.

## 2013-04-08 NOTE — Assessment & Plan Note (Signed)
Improving.  Congratulated patient on making significant dietary changes.  Increase metformin to bid with 1-2 weeks.

## 2013-04-08 NOTE — Progress Notes (Signed)
  Subjective:    Patient ID: Evan Leonard, male    DOB: Apr 09, 1961, 52 y.o.   MRN: [id number]  HPI  52 y/o white male with hx of PE on anticoagulation, morbid obesity, new onset type II diabetes and right leg cellulitus for follow up.  Patient's leg slowly getting better.  He still has significant swelling.  Right leg red redness is decreasing.  DM II - he has made significant dietary changes.  He reports losing 10 lbs since previous visit.  His AM blood sugar in around 150.  He is only taking metformin 500 mg once daily.  He is not sure whether metformin or doxycycline causing slight dizziness.  Review of Systems Negative for chest pain    Past Medical History  Diagnosis Date  . Obesity   . Pulmonary embolism 05/04/2012    bilaterally  . Exertional dyspnea 05/04/2012    "isolated episode" (05/05/2012)  . Sleep apnea     "pretty sure; never been tested" (05/05/2012)  . History of chickenpox   . DVT (deep venous thrombosis)     History   Social History  . Marital Status: Single    Spouse Name: N/A    Number of Children: N/A  . Years of Education: N/A   Occupational History  . [employer]   Social History Main Topics  . Smoking status: Never Smoker   . Smokeless tobacco: Never Used  . Alcohol Use: No  . Drug Use: No  . Sexual Activity: Not on file   Other Topics Concern  . Not on file   Social History Narrative  . No narrative on file    Past Surgical History  Procedure Laterality Date  . No past surgeries      Family History  Problem Relation Age of Onset  . Diabetes Mother     No Known Allergies  Current Outpatient Prescriptions on File Prior to Visit  Medication Sig Dispense Refill  . doxycycline (VIBRA-TABS) 100 MG tablet Take 1 tablet (100 mg total) by mouth 2 (two) times daily.  20 tablet  0  . metFORMIN (GLUCOPHAGE) 500 MG tablet Take 1 tablet (500 mg total) by mouth 2 (two) times daily with a meal.  60 tablet  1  . Multiple  Vitamin (MULTIVITAMIN WITH MINERALS) TABS tablet Take 1 tablet by mouth daily.      . Rivaroxaban (XARELTO) 20 MG TABS tablet Take 20 mg by mouth daily.       No current facility-administered medications on file prior to visit.    BP 124/82  Temp(Src) 98.9 F (37.2 C) (Oral)  Ht 6\' 2"  (1.88 m)  Wt 374 lb (169.645 kg)  BMI 48.00 kg/m2    Objective:   Physical Exam  Constitutional: He appears well-developed and well-nourished.  Cardiovascular: Normal rate, regular rhythm and normal heart sounds.   Pulmonary/Chest: Effort normal and breath sounds normal. He has no wheezes.  Musculoskeletal: He exhibits edema.  2+ pitting edema right leg  Skin:  Receding redness.  Less warm to touch.  Skin starting to peel and become scaly          Assessment & Plan:

## 2013-04-12 ENCOUNTER — Encounter: Payer: Self-pay | Admitting: Internal Medicine

## 2013-04-13 ENCOUNTER — Telehealth: Payer: Self-pay | Admitting: Internal Medicine

## 2013-04-13 NOTE — Telephone Encounter (Signed)
Pt states his insurance will no longer pay for test strips for his freestyle meter. Pt would like to get an accu check meter which they will pay for. pls advise.

## 2013-04-14 MED ORDER — GLUCOSE BLOOD VI STRP: 1.0000 | ORAL_STRIP | Freq: Every day | Status: DC

## 2013-04-14 MED ORDER — ACCU-CHEK NANO SMARTVIEW W/DEVICE KIT: 1.0000 | PACK | Freq: Every day | Status: DC

## 2013-04-14 NOTE — Telephone Encounter (Signed)
rx sent in electronically 

## 2013-04-17 NOTE — Assessment & Plan Note (Signed)
He is not tolerating AutoPap with pressure range up to 20. Plan-I again explained the importance of weight loss. Try reducing pressure range to 5-15. He may need bilevel.

## 2013-04-17 NOTE — Assessment & Plan Note (Signed)
Weight loss is important to most of his medical problems as explained

## 2013-04-19 ENCOUNTER — Ambulatory Visit (INDEPENDENT_AMBULATORY_CARE_PROVIDER_SITE_OTHER): Payer: PRIVATE HEALTH INSURANCE | Admitting: Internal Medicine

## 2013-04-19 ENCOUNTER — Encounter: Payer: Self-pay | Admitting: Internal Medicine

## 2013-04-19 VITALS — BP 124/84 | Temp 99.0°F | Wt 374.0 lb

## 2013-04-19 DIAGNOSIS — I2699 Other pulmonary embolism without acute cor pulmonale: Secondary | ICD-10-CM

## 2013-04-19 DIAGNOSIS — L02419 Cutaneous abscess of limb, unspecified: Secondary | ICD-10-CM

## 2013-04-19 DIAGNOSIS — L03119 Cellulitis of unspecified part of limb: Secondary | ICD-10-CM

## 2013-04-19 DIAGNOSIS — IMO0001 Reserved for inherently not codable concepts without codable children: Secondary | ICD-10-CM

## 2013-04-19 DIAGNOSIS — E1165 Type 2 diabetes mellitus with hyperglycemia: Secondary | ICD-10-CM

## 2013-04-19 MED ORDER — METFORMIN HCL 1000 MG PO TABS: 1000.0000 mg | ORAL_TABLET | Freq: Two times a day (BID) | ORAL | Status: DC

## 2013-04-19 NOTE — Progress Notes (Signed)
  Subjective:    Patient ID: Evan Leonard, male    DOB: 02/12/1961, 52 y.o.   MRN: [id number]  HPI  52 year old white male with history of DVT, new onset type 2 diabetes and right lower remedy cellulitis for followup. Patient finished course of doxycycline. Patient reports he still has residual redness but no pain in his right lower leg. When he elevates his legs, his edema significantly improves.  History of DVT and pulmonary embolism. He has followup with hematologist on 05/06/2013.  Review of Systems Negative for chest pain or shortness of breath    Past Medical History  Diagnosis Date  . Obesity   . Pulmonary embolism 05/04/2012    bilaterally  . Exertional dyspnea 05/04/2012    "isolated episode" (05/05/2012)  . Sleep apnea     "pretty sure; never been tested" (05/05/2012)  . History of chickenpox   . DVT (deep venous thrombosis)     History   Social History  . Marital Status: Single    Spouse Name: N/A    Number of Children: N/A  . Years of Education: N/A   Occupational History  . [employer]   Social History Main Topics  . Smoking status: Never Smoker   . Smokeless tobacco: Never Used  . Alcohol Use: No  . Drug Use: No  . Sexual Activity: Not on file   Other Topics Concern  . Not on file   Social History Narrative  . No narrative on file    Past Surgical History  Procedure Laterality Date  . No past surgeries      Family History  Problem Relation Age of Onset  . Diabetes Mother     No Known Allergies  Current Outpatient Prescriptions on File Prior to Visit  Medication Sig Dispense Refill  . Blood Glucose Monitoring Suppl (ACCU-CHEK NANO SMARTVIEW) W/DEVICE KIT 1 each by Does not apply route daily.  1 kit  0  . glucose blood test strip 1 each by Other route daily. Use as instructed  100 each  3  . Multiple Vitamin (MULTIVITAMIN WITH MINERALS) TABS tablet Take 1 tablet by mouth daily.      . Rivaroxaban (XARELTO) 20 MG TABS tablet  Take 20 mg by mouth daily.       No current facility-administered medications on file prior to visit.    BP 124/84  Temp(Src) 99 F (37.2 C) (Oral)  Wt 374 lb (169.645 kg)    Objective:   Physical Exam  Constitutional: He is oriented to person, place, and time. He appears well-developed and well-nourished.  Cardiovascular: Normal rate, regular rhythm and normal heart sounds.   Pulmonary/Chest: Effort normal and breath sounds normal. He has no wheezes.  Musculoskeletal:  Right lower ext edema.  No tenderness  Neurological: He is alert and oriented to person, place, and time. No cranial nerve deficit.          Assessment & Plan:

## 2013-04-19 NOTE — Assessment & Plan Note (Signed)
Follow up with Dr. Myna Hidalgo.  Considering recent issue with right leg swelling and cellulitis, consider prolonging anticoagulation 2-3 months.

## 2013-04-19 NOTE — Assessment & Plan Note (Signed)
Patient tolerating metformin 500 mg twice daily. Titrate to 1 g twice a day.  Monitor A1c before next office visit.

## 2013-04-19 NOTE — Patient Instructions (Addendum)
Please complete the following lab tests before your next follow up appointment:  BMET, A1c - 250.02 

## 2013-04-19 NOTE — Assessment & Plan Note (Signed)
Cellulitis improved but he has residual skin changes and leg swelling. Patient finished full course of doxycycline. Use compression hose as directed.

## 2013-04-19 NOTE — Progress Notes (Signed)
Pre visit review using our clinic review tool, if applicable. No additional management support is needed unless otherwise documented below in the visit note. 

## 2013-04-20 ENCOUNTER — Telehealth: Payer: Self-pay | Admitting: Internal Medicine

## 2013-04-20 NOTE — Telephone Encounter (Signed)
Left message for pt to call back  °

## 2013-04-20 NOTE — Telephone Encounter (Signed)
Let's see how he does with compression hose.  Let's see him back in 2 weeks.  Ok to start exercise program

## 2013-04-20 NOTE — Telephone Encounter (Signed)
Pt was seen yesterday and would like to know when can he  go back to work fulltime. Pt would like to know can he start doing some exercise like walking due to cellulitis on leg.

## 2013-04-21 NOTE — Telephone Encounter (Signed)
Pt aware.

## 2013-04-21 NOTE — Telephone Encounter (Signed)
Left message for pt to call back  °

## 2013-04-21 NOTE — Telephone Encounter (Signed)
If his legs feels better with compression hose, yes he can go back to work full time next week

## 2013-04-21 NOTE — Telephone Encounter (Signed)
Can he go back to work full time next week?

## 2013-04-26 ENCOUNTER — Other Ambulatory Visit: Payer: Self-pay | Admitting: Internal Medicine

## 2013-04-29 ENCOUNTER — Other Ambulatory Visit: Payer: Self-pay | Admitting: *Deleted

## 2013-05-06 ENCOUNTER — Other Ambulatory Visit (HOSPITAL_BASED_OUTPATIENT_CLINIC_OR_DEPARTMENT_OTHER): Payer: PRIVATE HEALTH INSURANCE | Admitting: Lab

## 2013-05-06 ENCOUNTER — Ambulatory Visit (HOSPITAL_BASED_OUTPATIENT_CLINIC_OR_DEPARTMENT_OTHER): Payer: PRIVATE HEALTH INSURANCE | Admitting: Hematology & Oncology

## 2013-05-06 VITALS — BP 140/83 | HR 101 | Temp 98.3°F | Resp 20 | Ht 74.0 in | Wt 368.0 lb

## 2013-05-06 DIAGNOSIS — I824Y9 Acute embolism and thrombosis of unspecified deep veins of unspecified proximal lower extremity: Secondary | ICD-10-CM

## 2013-05-06 DIAGNOSIS — I2699 Other pulmonary embolism without acute cor pulmonale: Secondary | ICD-10-CM

## 2013-05-06 NOTE — Progress Notes (Signed)
This office note has been dictated.

## 2013-05-07 LAB — D-DIMER, QUANTITATIVE: D-Dimer, Quant: 0.56 ug/mL-FEU — ABNORMAL HIGH (ref 0.00–0.48)

## 2013-05-07 NOTE — Progress Notes (Signed)
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  CURRENT THERAPY:  Xarelto 20 mg p.o. daily.  INTERIM HISTORY:  Evan Leonard comes in for followup.  He is doing okay.  We last saw him back in September.  He was recently admitted for a day for right lower extremity cellulitis.  He had Dopplers done.  Everything looked fine with no evidence of DVT.  He was put on antibiotics.  He did have a CT angiogram on the 28th of October.  Again the CT scan did not show any evidence of pulmonary embolism.  He is doing well on the Xarelto.  He has had no problems with bleeding. He does wear compression stockings.  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  He is still working.  He has had no problems with cough.  He has had no change in bowel or bladder habits.  He has had no rashes.  He has had no headache.  PHYSICAL EXAMINATION:  General:  This is a morbidly obese white gentleman in no obvious distress.  Vital Signs:  Temperature of 98.3, pulse 101, respiratory rate 20, blood pressure is 140/83, weight is 368 pounds.  Head and Neck:  Normocephalic and atraumatic skull.  He has no ocular or oral lesions.  There are no palpable cervical or supraclavicular lymph nodes.  Lungs:  Clear bilaterally.  Cardiac: Regular rate and rhythm with a normal S1 and S2.  No murmurs, rubs, or bruits.  Abdomen:  Obese.  Abdomen is soft.  He has decent bowel sounds. There is no fluid wave.  There is no palpable hepatosplenomegaly.  Back: Shows no tenderness over the spine, ribs, or hips.  Extremities:  Shows compression stockings on both legs.  He has some chronic nonpitting edema of the legs.  Skin:  No rashes, ecchymosis, or petechia.  LABORATORY STUDIES:  White cell count is 9.6, hemoglobin 13.6, hematocrit 40.7, platelet count is 346.  BUN is 17, creatinine 1.0.  IMPRESSION:  Evan Leonard is a very nice 52 year old gentleman with history of an idiopathic  thrombus of the left leg.  He had pulmonary embolism. He presented back in December of 2013.  He feels more comfortable being on Xarelto right now.  Certainly we have no problems with this.  We will continue him on the Xarelto.  I do not see that we have to do any more scans or Dopplers on him right now.  We will plan to see him back in about 3 months' time.    ______________________________ Josph Macho, M.D. PRE/MEDQ  D:  05/06/2013  T:  05/07/2013  Job:  [id number]

## 2013-05-21 ENCOUNTER — Ambulatory Visit: Payer: PRIVATE HEALTH INSURANCE | Admitting: Internal Medicine

## 2013-06-02 ENCOUNTER — Other Ambulatory Visit: Payer: Self-pay | Admitting: Internal Medicine

## 2013-06-18 ENCOUNTER — Ambulatory Visit: Payer: PRIVATE HEALTH INSURANCE | Admitting: Internal Medicine

## 2013-07-05 ENCOUNTER — Other Ambulatory Visit: Payer: PRIVATE HEALTH INSURANCE

## 2013-07-06 ENCOUNTER — Other Ambulatory Visit: Payer: PRIVATE HEALTH INSURANCE

## 2013-07-12 ENCOUNTER — Ambulatory Visit: Payer: PRIVATE HEALTH INSURANCE | Admitting: Internal Medicine

## 2013-07-15 ENCOUNTER — Ambulatory Visit: Payer: PRIVATE HEALTH INSURANCE | Admitting: Internal Medicine

## 2013-08-04 ENCOUNTER — Other Ambulatory Visit: Payer: Self-pay | Admitting: Internal Medicine

## 2013-08-05 ENCOUNTER — Encounter: Payer: Self-pay | Admitting: Hematology & Oncology

## 2013-08-05 ENCOUNTER — Telehealth: Payer: Self-pay | Admitting: Hematology & Oncology

## 2013-08-05 ENCOUNTER — Other Ambulatory Visit (HOSPITAL_BASED_OUTPATIENT_CLINIC_OR_DEPARTMENT_OTHER): Payer: PRIVATE HEALTH INSURANCE | Admitting: Lab

## 2013-08-05 ENCOUNTER — Ambulatory Visit (HOSPITAL_BASED_OUTPATIENT_CLINIC_OR_DEPARTMENT_OTHER): Payer: PRIVATE HEALTH INSURANCE | Admitting: Hematology & Oncology

## 2013-08-05 VITALS — BP 132/75 | HR 85 | Temp 98.0°F | Resp 18 | Ht 74.0 in | Wt 369.0 lb

## 2013-08-05 DIAGNOSIS — I82409 Acute embolism and thrombosis of unspecified deep veins of unspecified lower extremity: Secondary | ICD-10-CM

## 2013-08-05 DIAGNOSIS — I2699 Other pulmonary embolism without acute cor pulmonale: Secondary | ICD-10-CM

## 2013-08-05 DIAGNOSIS — Z7982 Long term (current) use of aspirin: Secondary | ICD-10-CM

## 2013-08-05 LAB — CBC WITH DIFFERENTIAL (CANCER CENTER ONLY)
BASO#: 0 10*3/uL (ref 0.0–0.2)
BASO%: 0.4 % (ref 0.0–2.0)
EOS%: 3.8 % (ref 0.0–7.0)
Eosinophils Absolute: 0.4 10*3/uL (ref 0.0–0.5)
HCT: 42.6 % (ref 38.7–49.9)
HGB: 14 g/dL (ref 13.0–17.1)
LYMPH#: 2.9 10*3/uL (ref 0.9–3.3)
LYMPH%: 31.1 % (ref 14.0–48.0)
MCH: 28.7 pg (ref 28.0–33.4)
MCHC: 32.9 g/dL (ref 32.0–35.9)
MCV: 87 fL (ref 82–98)
MONO#: 0.8 10*3/uL (ref 0.1–0.9)
MONO%: 8.3 % (ref 0.0–13.0)
NEUT#: 5.2 10*3/uL (ref 1.5–6.5)
NEUT%: 56.4 % (ref 40.0–80.0)
Platelets: 194 10*3/uL (ref 145–400)
RBC: 4.88 10*6/uL (ref 4.20–5.70)
RDW: 14.9 % (ref 11.1–15.7)
WBC: 9.2 10*3/uL (ref 4.0–10.0)

## 2013-08-05 NOTE — Telephone Encounter (Signed)
Per MD ok to schedule 5-6 appointment. Pt wants afternoon

## 2013-08-07 LAB — D-DIMER, QUANTITATIVE (NOT AT ARMC): D-Dimer, Quant: 0.37 ug/mL-FEU (ref 0.00–0.48)

## 2013-08-07 NOTE — Progress Notes (Signed)
Hematology and Oncology Follow Up Visit  Evan Leonard [id number] April 03, 1961 53 y.o. 08/07/2013   Principle Diagnosis:   Idiopathic deep vein thrombus of the left leg and pulmonary embolism  Current Therapy:   Patient to complete Xarelto and start aspirin 162 mg a day    Interim History:  [name] is back for followup. Since her last saw him, he's been having some issues with his leg. I think he has some cellulitis. He's been on the Xarelto. He's had no bleeding.  He's been on both of her now for 15 months. He's done well. His last Dopplers did not show any evidence of DVT. Last CT angiogram showed no evidence of pulmonary embolism.  He's working. Is on his feet a lot. He has compression stockings.  He's had no fever. He's had no cough or shortness of breath. There is no nausea or vomiting.  At the we can get him off his Xarelto and just on aspirin  Medications: Current outpatient prescriptions:Blood Glucose Monitoring Suppl (ACCU-CHEK NANO SMARTVIEW) W/DEVICE KIT, 1 each by Does not apply route daily., Disp: 1 kit, Rfl: 0;  glucose blood test strip, 1 each by Other route daily. Use as instructed, Disp: 100 each, Rfl: 3;  metFORMIN (GLUCOPHAGE) 500 MG tablet, TAKE 1 TABLET (500 MG TOTAL) BY MOUTH 2 (TWO) TIMES DAILY WITH A MEAL., Disp: 60 tablet, Rfl: 1 Multiple Vitamin (MULTIVITAMIN WITH MINERALS) TABS tablet, Take 1 tablet by mouth daily., Disp: , Rfl: ;  XARELTO 20 MG TABS tablet, TAKE 1 TABLET BY MOUTH EVERY DAY, Disp: 30 tablet, Rfl: 2  Allergies: No Known Allergies  Past Medical History, Surgical history, Social history, and Family History were reviewed and updated.  Review of Systems: As above  Physical Exam:  height is $RemoveB'6\' 2"'rGavMTNM$  (1.88 m) and weight is 369 lb (167.377 kg). His oral temperature is 98 F (36.7 C). His blood pressure is 132/75 and his pulse is 85. His respiration is 18.   Morbidly obese. Lungs are clear. No adenopathy in his neck. Cardiac exam regular in rhythm.  Abdomen morbidly obese. No fever sweats or chills. No liver or spleen tip. Back exam negative. Extremities shows some stasis dermatitis changes in his lower legs. No venous cord is noted. Neurological exam negative. Skin exam no ecchymoses or petechia.  Lab Results  Component Value Date   WBC 9.2 08/05/2013   HGB 14.0 08/05/2013   HCT 42.6 08/05/2013   MCV 87 08/05/2013   PLT 194 08/05/2013     Chemistry      Component Value Date/Time   NA 137 04/06/2013 1607   K 4.4 04/06/2013 1607   CL 101 04/06/2013 1607   CO2 28 04/06/2013 1607   BUN 17 04/06/2013 1607   CREATININE 1.0 04/06/2013 1607      Component Value Date/Time   CALCIUM 9.5 04/06/2013 1607   ALKPHOS 74 03/23/2013 0628   AST 19 03/23/2013 0628   ALT 17 03/23/2013 0628   BILITOT 1.0 03/23/2013 0628         Impression and Plan: Evan Leonard is 53 year old gentleman. He had a deep vein thrombus in his left leg along with a pulmonary embolism. The hypercoagulable studies roll negative.  We will go ahead and and see how it goes on aspirin. We will plan to get him back in another few months.   Volanda Napoleon, MD 3/14/201512:38 PM

## 2013-08-12 ENCOUNTER — Ambulatory Visit: Payer: PRIVATE HEALTH INSURANCE | Admitting: Internal Medicine

## 2013-09-14 ENCOUNTER — Ambulatory Visit: Payer: PRIVATE HEALTH INSURANCE | Admitting: Internal Medicine

## 2013-09-24 ENCOUNTER — Other Ambulatory Visit: Payer: Self-pay | Admitting: Internal Medicine

## 2013-09-29 ENCOUNTER — Ambulatory Visit: Payer: PRIVATE HEALTH INSURANCE | Admitting: Hematology & Oncology

## 2013-09-29 ENCOUNTER — Other Ambulatory Visit: Payer: PRIVATE HEALTH INSURANCE | Admitting: Lab

## 2013-10-06 ENCOUNTER — Emergency Department (INDEPENDENT_AMBULATORY_CARE_PROVIDER_SITE_OTHER)
Admission: EM | Admit: 2013-10-06 | Discharge: 2013-10-06 | Disposition: A | Discharge status: Home / Self Care | Payer: PRIVATE HEALTH INSURANCE | Source: Home / Self Care | Attending: Emergency Medicine | Admitting: Emergency Medicine

## 2013-10-06 ENCOUNTER — Encounter (HOSPITAL_COMMUNITY): Payer: Self-pay | Admitting: Emergency Medicine

## 2013-10-06 DIAGNOSIS — J069 Acute upper respiratory infection, unspecified: Secondary | ICD-10-CM

## 2013-10-06 LAB — POCT RAPID STREP A: Streptococcus, Group A Screen (Direct): NEGATIVE

## 2013-10-06 MED ORDER — PREDNISONE 20 MG PO TABS: 20.0000 mg | ORAL_TABLET | Freq: Two times a day (BID) | ORAL | Status: DC

## 2013-10-06 MED ORDER — HYDROCOD POLST-CHLORPHEN POLST 10-8 MG/5ML PO LQCR: 5.0000 mL | Freq: Two times a day (BID) | ORAL | Status: DC | PRN

## 2013-10-06 MED ORDER — AMOXICILLIN-POT CLAVULANATE 875-125 MG PO TABS: 1.0000 | ORAL_TABLET | Freq: Two times a day (BID) | ORAL | Status: DC

## 2013-10-06 MED ORDER — FLUTICASONE PROPIONATE 50 MCG/ACT NA SUSP: 2.0000 | Freq: Every day | NASAL | Status: DC

## 2013-10-06 NOTE — ED Provider Notes (Signed)
Chief Complaint   Chief Complaint  Patient presents with  . URI    History of Present Illness   Evan Leonard is a 53 year old male who has had a four-day history of nausea, dry heaves, epigastric pain, headache, sinus pressure, nasal congestion with clear to green drainage, subjective fever, chills, and cough productive of clear to greenish sputum. He denies any diarrhea, earache, sore throat, wheezing, chest pain, or chest tightness. No known sick exposures. Has a history of sinusitis in the past.  Review of Systems   Other than as noted above, the patient denies any of the following symptoms: Systemic:  No fevers, chills, sweats, or myalgias. Eye:  No redness or discharge. ENT:  No ear pain, headache, nasal congestion, drainage, sinus pressure, or sore throat. Neck:  No neck pain, stiffness, or swollen glands. Lungs:  No cough, sputum production, hemoptysis, wheezing, chest tightness, shortness of breath or chest pain. GI:  No abdominal pain, nausea, vomiting or diarrhea.  [place]   Past medical history, family history, social history, meds, and allergies were reviewed. He has diabetes and takes aspirin and metformin.  Physical exam   Vital signs:  BP 142/80  Pulse 82  Temp(Src) 98.3 F (36.8 C) (Oral)  Resp 18  SpO2 98% General:  Alert and oriented.  In no distress.  Skin warm and dry. Eye:  No conjunctival injection or drainage. Lids were normal. ENT:  TMs and canals were normal, without erythema or inflammation.  Nasal mucosa was clear and uncongested, without drainage.  Mucous membranes were moist.  Pharynx was erythematous and swollen without exudate.  There were no oral ulcerations or lesions. Neck:  Supple, no adenopathy, tenderness or mass. Lungs:  No respiratory distress.  Lungs were clear to auscultation, without wheezes, rales or rhonchi.  Breath sounds were clear and equal bilaterally.  Heart:  Regular rhythm, without gallops, murmers or rubs. Skin:  Clear, warm,  and dry, without rash or lesions.  Labs   Results for orders placed during the hospital encounter of 10/06/13  POCT RAPID STREP A ([facility] CARE ONLY)      Result Value Ref Range   Streptococcus, Group A Screen (Direct) NEGATIVE  NEGATIVE    Assessment     The encounter diagnosis was Viral URI.  Plan    1.  Meds:  The following meds were prescribed:   Discharge Medication List as of 10/06/2013  3:39 PM    START taking these medications   Details  amoxicillin-clavulanate (AUGMENTIN) 875-125 MG per tablet Take 1 tablet by mouth 2 (two) times daily., Starting 10/06/2013, Until Discontinued, Print    chlorpheniramine-HYDROcodone (TUSSIONEX) 10-8 MG/5ML LQCR Take 5 mLs by mouth every 12 (twelve) hours as needed for cough., Starting 10/06/2013, Until Discontinued, Normal    fluticasone (FLONASE) 50 MCG/ACT nasal spray Place 2 sprays into both nostrils daily., Starting 10/06/2013, Until Discontinued, Normal    predniSONE (DELTASONE) 20 MG tablet Take 1 tablet (20 mg total) by mouth 2 (two) times daily., Starting 10/06/2013, Until Discontinued, Normal       The patient was told not to get the prescription for antibiotic filled unless his respiratory symptoms had persisted for more than 7 to 10 days.  2.  Patient Education/Counseling:  The patient was given appropriate handouts, self care instructions, and instructed in symptomatic relief.  Instructed to get extra fluids, rest, and use a cool mist vaporizer.    3.  Follow up:  The patient was told to follow up here if  no better in 3 to 4 days, or sooner if becoming worse in any way, and given some red flag symptoms such as increasing fever, difficulty breathing, chest pain, or persistent vomiting which would prompt immediate return.  Follow up here as needed.      Harden Mo, MD 10/06/13 [phone]

## 2013-10-06 NOTE — ED Notes (Signed)
Patient complains of sinus pressure and pain with some nausea and pain that started Sunday; states green discharge from nose with fever and chills; denies pressure in ears.

## 2013-10-06 NOTE — Discharge Instructions (Signed)
Do not get antibiotic filled unless no better in 2 to 3 days.   Most upper respiratory infections are caused by viruses and do not require antibiotics.  We try to save the antibiotics for when we really need them to prevent bacteria from developing resistance to them.  Here are a few hints about things that can be done at home to help get over an upper respiratory infection quicker:  Get extra sleep and extra fluids.  Get 7 to 9 hours of sleep per night and 6 to 8 glasses of water a day.  Getting extra sleep keeps the immune system from getting run down.  Most people with an upper respiratory infection are a little dehydrated.  The extra fluids also keep the secretions liquified and easier to deal with.  Also, get extra vitamin C.  4000 mg per day is the recommended dose. For the aches, headache, and fever, acetaminophen or ibuprofen are helpful.  These can be alternated every 4 hours.  People with liver disease should avoid large amounts of acetaminophen, and people with ulcer disease, gastroesophageal reflux, gastritis, congestive heart failure, chronic kidney disease, coronary artery disease and the elderly should avoid ibuprofen. For nasal congestion try Mucinex-D, or if you're having lots of sneezing or clear nasal drainage use Zyrtec-D. People with high blood pressure can take these if their blood pressure is controlled, if not, it's best to avoid the forms with a "D" (decongestants).  You can use the plain Mucinex, Allegra, Claritin, or Zyrtec even if your blood pressure is not controlled.   A Saline nasal spray such as Ocean Spray can also help.  You can add a decongestant sprays such as Afrin, but you should not use the decongestant sprays for more than 3 or 4 days since they can be habituating.  Breathe Rite nasal strips can also offer a non-drug alternative treatment to nasal congestion, especially at night. For people with symptoms of sinusitis, sleeping with your head elevated can be helpful.   For sinus pain, moist, hot compresses to the face may provide some relief.  Many people find that inhaling steam as in a shower or from a pot of steaming water can help. For any viral infection, zinc containing lozenges such as Cold-Eze or Zicam are helpful.  Zinc helps to fight viral infection.  Hot salt water gargles (8 oz of hot water, 1/2 tsp of table salt, and a pinch of baking soda) can give relief as well as hot beverages such as hot tea.  Sucrets extra strength lozenges will help the sore throat.  For the cough, take Delsym 2 tsp every 12 hours.  It has also been found recently that Aleve can help control a cough.  The dose is 1 to 2 tablets twice daily with food.  This can be combined with Delsym. (Note, if you are taking ibuprofen, you should not take Aleve as well--take one or the other.) A cool mist vaporizer will help keep your mucous membranes from drying out.   It's important when you have an upper respiratory infection not to pass the infection to others.  This involves being very careful about the following:  Frequent hand washing or use of hand sanitizer, especially after coughing, sneezing, blowing your nose or touching your face, nose or eyes. Do not shake hands or touch anyone and try to avoid touching surfaces that other people use such as doorknobs, shopping carts, telephones and computer keyboards. Use tissues and dispose of them properly in a  garbage can or ziplock bag. Cough into your sleeve. Do not let others eat or drink after you.  It's also important to recognize the signs of serious illness and get evaluated if they occur: Any respiratory infection that lasts more than 7 to 10 days.  Yellow nasal drainage and sputum are not reliable indicators of a bacterial infection, but if they last for more than 1 week, see your doctor. Fever and sore throat can indicate strep. Fever and cough can indicate influenza or pneumonia. Any kind of severe symptom such as difficulty  breathing, intractable vomiting, or severe pain should prompt you to see a doctor as soon as possible.   Your body's immune system is really the thing that will get rid of this infection.  Your immune system is comprised of 2 types of specialized cells called T cells and B cells.  T cells coordinate the array of cells in your body that engulf invading bacteria or viruses while B cells orchestrate the production of antibodies that neutralize infection.  Anything we do or any medications we give you, will just strengthen your immune system or help it clear up the infection quicker.  Here are a few helpful hints to improve your immune system to help overcome this illness or to prevent future infections:  A few vitamins can improve the health of your immune system.  That's why your diet should include plenty of fruits, vegetables, fish, nuts, and whole grains.  Vitamin A and bet-carotene can increase the cells that fight infections (T cells and B cells).  Vitamin A is abundant in dark greens and orange vegetables such as spinach, greens, sweet potatoes, and carrots.  Vitamin B6 contributes to the maturation of white blood cells, the cells that fight disease.  Foods with vitamin B6 include cold cereal and bananas.  Vitamin C is credited with preventing colds because it increases white blood cells and also prevents cellular damage.  Citrus fruits, peaches and green and red bell peppers are all hight in vitamin C.  Vitamin E is an anti-oxidant that encourages the production of natural killer cells which reject foreign invaders and B cells that produce antibodies.  Foods high in vitamin E include wheat germ, nuts and seeds.  Foods high in omega-3 fatty acids found in foods like salmon, tuna and mackerel boost your immune system and help cells to engulf and absorb germs.  Probiotics are good bacteria that increase your T cells.  These can be found in yogurt and are available in supplements such as Culturelle  or Align.  Moderate exercise increases the strength of your immune system and your ability to recover from illness.  I suggest 3 to 5 moderate intensity 30 minute workouts per week.    Sleep is another component of maintaining a strong immune system.  It enables your body to recuperate from the day's activities, stress and work.  My recommendation is to get between 7 and 9 hours of sleep per night.  If you smoke, try to quit completely or at least cut down.  Drink alcohol only in moderation if at all.  No more than 2 drinks daily for men or 1 for women.  Get a flu vaccine early in the fall or if you have not gotten one yet, once this illness has run its course.  If you are over 65, a smoker, or an asthmatic, get a pneumococcal vaccine.  My final recommendation is to maintain a healthy weight.  Excess weight can impair the  immune system by interfering with the way the immune system deals with invading viruses or bacteria.

## 2013-10-08 LAB — CULTURE, GROUP A STREP

## 2013-10-19 ENCOUNTER — Ambulatory Visit: Payer: PRIVATE HEALTH INSURANCE | Admitting: Internal Medicine

## 2013-11-18 ENCOUNTER — Ambulatory Visit: Payer: PRIVATE HEALTH INSURANCE | Admitting: Internal Medicine

## 2013-12-02 ENCOUNTER — Emergency Department (INDEPENDENT_AMBULATORY_CARE_PROVIDER_SITE_OTHER)
Admission: EM | Admit: 2013-12-02 | Discharge: 2013-12-02 | Disposition: A | Discharge status: Home / Self Care | Payer: PRIVATE HEALTH INSURANCE | Source: Home / Self Care | Attending: Family Medicine | Admitting: Family Medicine

## 2013-12-02 ENCOUNTER — Encounter (HOSPITAL_COMMUNITY): Payer: Self-pay | Admitting: Emergency Medicine

## 2013-12-02 DIAGNOSIS — N390 Urinary tract infection, site not specified: Secondary | ICD-10-CM

## 2013-12-02 LAB — POCT URINALYSIS DIP (DEVICE)
Bilirubin Urine: NEGATIVE
Glucose, UA: NEGATIVE mg/dL
Ketones, ur: NEGATIVE mg/dL
Nitrite: POSITIVE — AB
Protein, ur: 100 mg/dL — AB
Specific Gravity, Urine: 1.025 (ref 1.005–1.030)
Urobilinogen, UA: 2 mg/dL — ABNORMAL HIGH (ref 0.0–1.0)
pH: 5.5 (ref 5.0–8.0)

## 2013-12-02 MED ORDER — CIPROFLOXACIN HCL 500 MG PO TABS: 500.0000 mg | ORAL_TABLET | Freq: Two times a day (BID) | ORAL | Status: DC

## 2013-12-02 NOTE — ED Provider Notes (Signed)
Medical screening examination/treatment/procedure(s) were performed by resident physician or non-physician practitioner and as supervising physician I was immediately available for consultation/collaboration.   Pauline Good MD.   Billy Fischer, MD 12/02/13 2040

## 2013-12-02 NOTE — ED Notes (Signed)
Pt  Reports      Frequent  Scanty  Cloudy  Urination        With       Sensation  Of  Chills      Symptoms  X  2  Days                Pt is   Diabetic      Takes  Metformin     Twice  A  Day               As  Well  As  An  Asa  81  Mg  As  Directed

## 2013-12-02 NOTE — Discharge Instructions (Signed)

## 2013-12-02 NOTE — ED Provider Notes (Signed)
CSN: [id number]     Arrival date & time 12/02/13  1645 History   First MD Initiated Contact with Patient 12/02/13 1715     Chief Complaint  Patient presents with  . Urinary Tract Infection   (Consider location/radiation/quality/duration/timing/severity/associated sxs/prior Treatment) HPI Comments: 53 year old male presents complaining of possible urinary tract infection. For 3 days, he has mild burning with urination, cloudy urine, and foul-smelling urine. He denies any and all other symptoms. Denies any risk for STDs. Denies back pain, flank pain, NVD, fever, or penile discharge.  Patient is a 53 y.o. male presenting with urinary tract infection.  Urinary Tract Infection Pertinent negatives include no abdominal pain.    Past Medical History  Diagnosis Date  . Obesity   . Pulmonary embolism 05/04/2012    bilaterally  . Exertional dyspnea 05/04/2012    "isolated episode" (05/05/2012)  . Sleep apnea     "pretty sure; never been tested" (05/05/2012)  . History of chickenpox   . DVT (deep venous thrombosis)   . Diabetes mellitus, type II 02/2013   Past Surgical History  Procedure Laterality Date  . No past surgeries     Family History  Problem Relation Age of Onset  . Diabetes Mother    History  Substance Use Topics  . Smoking status: Never Smoker   . Smokeless tobacco: Never Used     Comment: never used tobacco  . Alcohol Use: No    Review of Systems  Gastrointestinal: Negative for nausea, vomiting, abdominal pain and diarrhea.  Genitourinary: Positive for dysuria, urgency and frequency. Negative for hematuria, flank pain, discharge and testicular pain.       Malodorous urine  All other systems reviewed and are negative.   Allergies  Review of patient's allergies indicates no known allergies.  Home Medications   Prior to Admission medications   Medication Sig Start Date End Date Taking? Authorizing Provider  amoxicillin-clavulanate (AUGMENTIN) 875-125 MG per tablet  Take 1 tablet by mouth 2 (two) times daily. 10/06/13   Harden Mo, MD  aspirin 81 MG tablet Take 81 mg by mouth 2 (two) times daily.    Historical Provider, MD  Blood Glucose Monitoring Suppl (ACCU-CHEK NANO SMARTVIEW) W/DEVICE KIT 1 each by Does not apply route daily. 04/14/13   Doe-Hyun R Shawna Orleans, DO  chlorpheniramine-HYDROcodone (TUSSIONEX) 10-8 MG/5ML LQCR Take 5 mLs by mouth every 12 (twelve) hours as needed for cough. 10/06/13   Harden Mo, MD  ciprofloxacin (CIPRO) 500 MG tablet Take 1 tablet (500 mg total) by mouth every 12 (twelve) hours. 12/02/13   Freeman Caldron Jaree Trinka, PA-C  fluticasone (FLONASE) 50 MCG/ACT nasal spray Place 2 sprays into both nostrils daily. 10/06/13   Harden Mo, MD  glucose blood test strip 1 each by Other route daily. Use as instructed 04/14/13   Doe-Hyun R Shawna Orleans, DO  metFORMIN (GLUCOPHAGE) 500 MG tablet TAKE 1 TABLET (500 MG TOTAL) BY MOUTH 2 (TWO) TIMES DAILY WITH A MEAL.    Doe-Hyun Kyra Searles, DO  Multiple Vitamin (MULTIVITAMIN WITH MINERALS) TABS tablet Take 1 tablet by mouth daily.    Historical Provider, MD  predniSONE (DELTASONE) 20 MG tablet Take 1 tablet (20 mg total) by mouth 2 (two) times daily. 10/06/13   Harden Mo, MD  XARELTO 20 MG TABS tablet TAKE 1 TABLET BY MOUTH EVERY DAY 04/26/13   Doe-Hyun R Shawna Orleans, DO   BP 127/84  Pulse 90  Temp(Src) 99.1 F (37.3 C) (Oral)  Resp 20  SpO2  99% Physical Exam  Nursing note and vitals reviewed. Constitutional: He is oriented to person, place, and time. He appears well-developed and well-nourished. No distress.  Morbidly obese habitus  HENT:  Head: Normocephalic.  Pulmonary/Chest: Effort normal. No respiratory distress.  Abdominal: Soft. Bowel sounds are normal. He exhibits no distension and no mass. There is no tenderness. There is no rebound and no guarding.  Neurological: He is alert and oriented to person, place, and time. Coordination normal.  Skin: Skin is warm and dry. No rash noted. He is not diaphoretic.   Psychiatric: He has a normal mood and affect. Judgment normal.    ED Course  Procedures (including critical care time) Labs Review Labs Reviewed  POCT URINALYSIS DIP (DEVICE) - Abnormal; Notable for the following:    Hgb urine dipstick MODERATE (*)    Protein, ur 100 (*)    Urobilinogen, UA 2.0 (*)    Nitrite POSITIVE (*)    Leukocytes, UA LARGE (*)    All other components within normal limits    Imaging Review No results found.   MDM   1. UTI (lower urinary tract infection)     Cipro twice a day for 2 weeks for complicated UTI. Followup as needed. Urine cultures sent   Meds ordered this encounter  Medications  . ciprofloxacin (CIPRO) 500 MG tablet    Sig: Take 1 tablet (500 mg total) by mouth every 12 (twelve) hours.    Dispense:  28 tablet    Refill:  0    Order Specific Question:  Supervising Provider    Answer:  Jake Michaelis, DAVID C [[id number]]      Liam Graham, PA-C 12/02/13 1806

## 2013-12-05 LAB — URINE CULTURE: Colony Count: >=100000

## 2013-12-06 NOTE — ED Notes (Signed)
Urine culture: >100,000 colonies E. Coli.  Pt. adequately treated with Cipro. Evan Leonard 12/06/2013

## 2013-12-10 ENCOUNTER — Encounter (HOSPITAL_COMMUNITY): Payer: Self-pay | Admitting: Emergency Medicine

## 2013-12-10 ENCOUNTER — Emergency Department (HOSPITAL_COMMUNITY): Payer: PRIVATE HEALTH INSURANCE

## 2013-12-10 ENCOUNTER — Emergency Department (INDEPENDENT_AMBULATORY_CARE_PROVIDER_SITE_OTHER)
Admission: EM | Admit: 2013-12-10 | Discharge: 2013-12-10 | Disposition: A | Discharge status: Nursing Home (Includes ICF) | Payer: PRIVATE HEALTH INSURANCE | Source: Home / Self Care | Attending: Emergency Medicine | Admitting: Emergency Medicine

## 2013-12-10 ENCOUNTER — Emergency Department (HOSPITAL_COMMUNITY)
Admission: EM | Admit: 2013-12-10 | Discharge: 2013-12-11 | Disposition: A | Discharge status: Home / Self Care | Payer: PRIVATE HEALTH INSURANCE | Attending: Emergency Medicine | Admitting: Emergency Medicine

## 2013-12-10 DIAGNOSIS — Z8619 Personal history of other infectious and parasitic diseases: Secondary | ICD-10-CM | POA: Insufficient documentation

## 2013-12-10 DIAGNOSIS — R1033 Periumbilical pain: Secondary | ICD-10-CM

## 2013-12-10 DIAGNOSIS — Z79899 Other long term (current) drug therapy: Secondary | ICD-10-CM | POA: Insufficient documentation

## 2013-12-10 DIAGNOSIS — Z7982 Long term (current) use of aspirin: Secondary | ICD-10-CM | POA: Insufficient documentation

## 2013-12-10 DIAGNOSIS — E119 Type 2 diabetes mellitus without complications: Secondary | ICD-10-CM | POA: Insufficient documentation

## 2013-12-10 DIAGNOSIS — R112 Nausea with vomiting, unspecified: Secondary | ICD-10-CM

## 2013-12-10 DIAGNOSIS — N201 Calculus of ureter: Secondary | ICD-10-CM | POA: Insufficient documentation

## 2013-12-10 DIAGNOSIS — E669 Obesity, unspecified: Secondary | ICD-10-CM | POA: Insufficient documentation

## 2013-12-10 DIAGNOSIS — R1909 Other intra-abdominal and pelvic swelling, mass and lump: Secondary | ICD-10-CM

## 2013-12-10 DIAGNOSIS — Z86718 Personal history of other venous thrombosis and embolism: Secondary | ICD-10-CM | POA: Insufficient documentation

## 2013-12-10 DIAGNOSIS — Z86711 Personal history of pulmonary embolism: Secondary | ICD-10-CM | POA: Insufficient documentation

## 2013-12-10 LAB — COMPREHENSIVE METABOLIC PANEL
ALT: 32 U/L (ref 0–53)
AST: 23 U/L (ref 0–37)
Albumin: 3.8 g/dL (ref 3.5–5.2)
Alkaline Phosphatase: 66 U/L (ref 39–117)
Anion gap: 14 (ref 5–15)
BUN: 12 mg/dL (ref 6–23)
CO2: 25 mEq/L (ref 19–32)
Calcium: 9.1 mg/dL (ref 8.4–10.5)
Chloride: 99 mEq/L (ref 96–112)
Creatinine, Ser: 0.9 mg/dL (ref 0.50–1.35)
GFR calc Af Amer: >90 mL/min (ref >90)
GFR calc non Af Amer: >90 mL/min (ref >90)
Glucose, Bld: 149 mg/dL — ABNORMAL HIGH (ref 70–99)
Potassium: 4.2 mEq/L (ref 3.7–5.3)
Sodium: 138 mEq/L (ref 137–147)
Total Bilirubin: 0.7 mg/dL (ref 0.3–1.2)
Total Protein: 8.2 g/dL (ref 6.0–8.3)

## 2013-12-10 LAB — CBC WITH DIFFERENTIAL/PLATELET
Basophils Absolute: 0 10*3/uL (ref 0.0–0.1)
Basophils Relative: 0 % (ref 0–1)
Eosinophils Absolute: 0 10*3/uL (ref 0.0–0.7)
Eosinophils Relative: 0 % (ref 0–5)
HCT: 45.2 % (ref 39.0–52.0)
Hemoglobin: 15.4 g/dL (ref 13.0–17.0)
Lymphocytes Relative: 10 % — ABNORMAL LOW (ref 12–46)
Lymphs Abs: 1.7 10*3/uL (ref 0.7–4.0)
MCH: 30 pg (ref 26.0–34.0)
MCHC: 34.1 g/dL (ref 30.0–36.0)
MCV: 88.1 fL (ref 78.0–100.0)
Monocytes Absolute: 1.2 10*3/uL — ABNORMAL HIGH (ref 0.1–1.0)
Monocytes Relative: 7 % (ref 3–12)
Neutro Abs: 14.3 10*3/uL — ABNORMAL HIGH (ref 1.7–7.7)
Neutrophils Relative %: 83 % — ABNORMAL HIGH (ref 43–77)
Platelets: 246 10*3/uL (ref 150–400)
RBC: 5.13 MIL/uL (ref 4.22–5.81)
RDW: 14.3 % (ref 11.5–15.5)
WBC: 17.2 10*3/uL — ABNORMAL HIGH (ref 4.0–10.5)

## 2013-12-10 LAB — POCT URINALYSIS DIP (DEVICE)
Bilirubin Urine: NEGATIVE
Glucose, UA: NEGATIVE mg/dL
Ketones, ur: NEGATIVE mg/dL
Leukocytes, UA: NEGATIVE
Nitrite: NEGATIVE
Protein, ur: 30 mg/dL — AB
Specific Gravity, Urine: 1.02 (ref 1.005–1.030)
Urobilinogen, UA: 0.2 mg/dL (ref 0.0–1.0)
pH: 7 (ref 5.0–8.0)

## 2013-12-10 LAB — LIPASE, BLOOD: Lipase: 20 U/L (ref 11–59)

## 2013-12-10 MED ORDER — ONDANSETRON 4 MG PO TBDP
ORAL_TABLET | ORAL | Status: AC
  Filled 2013-12-10: qty 2

## 2013-12-10 MED ORDER — MORPHINE SULFATE 4 MG/ML IJ SOLN
6.0000 mg | Freq: Once | INTRAMUSCULAR | Status: AC
  Administered 2013-12-10: 6 mg via INTRAVENOUS
  Filled 2013-12-10: qty 2

## 2013-12-10 MED ORDER — IOHEXOL 300 MG/ML  SOLN
100.0000 mL | Freq: Once | INTRAMUSCULAR | Status: AC | PRN
  Administered 2013-12-10: 100 mL via INTRAVENOUS

## 2013-12-10 MED ORDER — IOHEXOL 300 MG/ML  SOLN
25.0000 mL | Freq: Once | INTRAMUSCULAR | Status: AC | PRN
  Administered 2013-12-10: 25 mL via ORAL

## 2013-12-10 MED ORDER — ONDANSETRON HCL 4 MG/2ML IJ SOLN
4.0000 mg | Freq: Once | INTRAMUSCULAR | Status: AC
  Administered 2013-12-10: 4 mg via INTRAVENOUS
  Filled 2013-12-10: qty 2

## 2013-12-10 MED ORDER — ONDANSETRON 4 MG PO TBDP
8.0000 mg | ORAL_TABLET | Freq: Once | ORAL | Status: AC
  Administered 2013-12-10: 8 mg via ORAL

## 2013-12-10 MED ORDER — SODIUM CHLORIDE 0.9 % IV BOLUS (SEPSIS)
1000.0000 mL | Freq: Once | INTRAVENOUS | Status: AC
  Administered 2013-12-10: 1000 mL via INTRAVENOUS

## 2013-12-10 NOTE — ED Notes (Signed)
The pt has been treated  For a uti and is on cipro.  They did a urine there but no blood work

## 2013-12-10 NOTE — ED Notes (Signed)
The pt is c/o mid abd pain for 2-3 days.  He was sen here from the clinic.  N v no diarrhea

## 2013-12-10 NOTE — ED Notes (Signed)
Pt finished contrast, CT notified

## 2013-12-10 NOTE — ED Provider Notes (Signed)
CSN: [id number]     Arrival date & time 12/10/13  1632 History   First MD Initiated Contact with Patient 12/10/13 1728     Chief Complaint  Patient presents with  . Abdominal Pain   (Consider location/radiation/quality/duration/timing/severity/associated sxs/prior Treatment) HPI Comments: 53 year old male presents complaining of abdominal pain, nausea, vomiting. She was seen here one week ago and started on Cipro for a UTI. He started to feel better up until 2 days ago when he started to get worse again. He has pain mostly centered around his umbilicus. The pain comes and goes. He has had nausea and vomiting but no diarrhea. He doesn't think he has ever had this before. No hematuria or dysuria. No back pain or flank pain. No constipation. He has noted a large red spot at his umbilicus that he does not think was there before.  Patient is a 53 y.o. male presenting with abdominal pain.  Abdominal Pain Associated symptoms: nausea and vomiting   Associated symptoms: no chills, no constipation, no diarrhea and no fever     Past Medical History  Diagnosis Date  . Obesity   . Pulmonary embolism 05/04/2012    bilaterally  . Exertional dyspnea 05/04/2012    "isolated episode" (05/05/2012)  . Sleep apnea     "pretty sure; never been tested" (05/05/2012)  . History of chickenpox   . DVT (deep venous thrombosis)   . Diabetes mellitus, type II 02/2013   Past Surgical History  Procedure Laterality Date  . No past surgeries     Family History  Problem Relation Age of Onset  . Diabetes Mother    History  Substance Use Topics  . Smoking status: Never Smoker   . Smokeless tobacco: Never Used     Comment: never used tobacco  . Alcohol Use: No    Review of Systems  Constitutional: Negative for fever and chills.  Gastrointestinal: Positive for nausea, vomiting and abdominal pain. Negative for diarrhea and constipation.       Red spot at umbilicus  All other systems reviewed and are  negative.   Allergies  Review of patient's allergies indicates no known allergies.  Home Medications   Prior to Admission medications   Medication Sig Start Date End Date Taking? Authorizing Provider  amoxicillin-clavulanate (AUGMENTIN) 875-125 MG per tablet Take 1 tablet by mouth 2 (two) times daily. 10/06/13   Harden Mo, MD  aspirin 81 MG tablet Take 81 mg by mouth 2 (two) times daily.    Historical Provider, MD  Blood Glucose Monitoring Suppl (ACCU-CHEK NANO SMARTVIEW) W/DEVICE KIT 1 each by Does not apply route daily. 04/14/13   Doe-Hyun R Shawna Orleans, DO  chlorpheniramine-HYDROcodone (TUSSIONEX) 10-8 MG/5ML LQCR Take 5 mLs by mouth every 12 (twelve) hours as needed for cough. 10/06/13   Harden Mo, MD  ciprofloxacin (CIPRO) 500 MG tablet Take 1 tablet (500 mg total) by mouth every 12 (twelve) hours. 12/02/13   Freeman Caldron Sakeenah Valcarcel, PA-C  fluticasone (FLONASE) 50 MCG/ACT nasal spray Place 2 sprays into both nostrils daily. 10/06/13   Harden Mo, MD  glucose blood test strip 1 each by Other route daily. Use as instructed 04/14/13   Doe-Hyun R Shawna Orleans, DO  metFORMIN (GLUCOPHAGE) 500 MG tablet TAKE 1 TABLET (500 MG TOTAL) BY MOUTH 2 (TWO) TIMES DAILY WITH A MEAL.    Doe-Hyun Kyra Searles, DO  Multiple Vitamin (MULTIVITAMIN WITH MINERALS) TABS tablet Take 1 tablet by mouth daily.    Historical Provider, MD  predniSONE (  DELTASONE) 20 MG tablet Take 1 tablet (20 mg total) by mouth 2 (two) times daily. 10/06/13   Harden Mo, MD  XARELTO 20 MG TABS tablet TAKE 1 TABLET BY MOUTH EVERY DAY 04/26/13   Doe-Hyun R Shawna Orleans, DO   BP 134/89  Pulse 95  Temp(Src) 100.3 F (37.9 C) (Oral)  Resp 16  SpO2 96% Physical Exam  Nursing note and vitals reviewed. Constitutional: He is oriented to person, place, and time. He appears well-developed and well-nourished. He appears distressed (mild).  Morbidly obese body habitus  HENT:  Head: Normocephalic.  Pulmonary/Chest: Effort normal. No respiratory distress.  Abdominal:  He exhibits distension (Protuberant). There is tenderness in the periumbilical area. There is no rigidity, no rebound, no guarding, no CVA tenderness, no tenderness at McBurney's point and negative Murphy's sign. A hernia (possible) is present.    Neurological: He is alert and oriented to person, place, and time. Coordination normal.  Skin: Skin is warm and dry. No rash noted. He is not diaphoretic.  Psychiatric: He has a normal mood and affect. Judgment normal.    ED Course  Procedures (including critical care time) Labs Review Labs Reviewed  POCT URINALYSIS DIP (DEVICE) - Abnormal; Notable for the following:    Hgb urine dipstick MODERATE (*)    Protein, ur 30 (*)    All other components within normal limits    Imaging Review No results found.   MDM   1. Periumbilical abdominal pain   2. Umbilical mass   3. Nausea and vomiting, vomiting of unspecified type    Exam indicates severely inflamed umbilical hernia versus sister Wynona Dove nodule. Given his fever developing despite the antibiotics, this patient needs a CT scan to further delineate his diagnosis. He is being transferred to the ED via shuttle. He was given Zofran here prior to transfer   Meds ordered this encounter  Medications  . ondansetron (ZOFRAN-ODT) disintegrating tablet 8 mg    Sig:        Liam Graham, PA-C 12/10/13 1845

## 2013-12-10 NOTE — ED Notes (Signed)
Pt  Seen  8  Days  Ago  For      uti      Taking  cipro       -  He  Reports  sev  Days  Ago  He  Developed     Low  abd  Pain      Vomited  X  3   -  No  Diarrhea            Appetite  Is  decreased

## 2013-12-10 NOTE — ED Notes (Signed)
Last bm Thursday am

## 2013-12-11 LAB — URINE MICROSCOPIC-ADD ON

## 2013-12-11 LAB — URINALYSIS, ROUTINE W REFLEX MICROSCOPIC
Bilirubin Urine: NEGATIVE
Glucose, UA: NEGATIVE mg/dL
Ketones, ur: 15 mg/dL — AB
Leukocytes, UA: NEGATIVE
Nitrite: NEGATIVE
Protein, ur: NEGATIVE mg/dL
Specific Gravity, Urine: 1.01 (ref 1.005–1.030)
Urobilinogen, UA: 0.2 mg/dL (ref 0.0–1.0)
pH: 6 (ref 5.0–8.0)

## 2013-12-11 MED ORDER — SULFAMETHOXAZOLE-TRIMETHOPRIM 800-160 MG PO TABS: 2.0000 | ORAL_TABLET | Freq: Two times a day (BID) | ORAL | Status: DC

## 2013-12-11 MED ORDER — OXYCODONE-ACETAMINOPHEN 5-325 MG PO TABS: 1.0000 | ORAL_TABLET | Freq: Four times a day (QID) | ORAL | Status: DC | PRN

## 2013-12-11 MED ORDER — PROMETHAZINE HCL 25 MG PO TABS: 25.0000 mg | ORAL_TABLET | Freq: Three times a day (TID) | ORAL | Status: DC | PRN

## 2013-12-11 NOTE — ED Provider Notes (Signed)
Medical screening examination/treatment/procedure(s) were performed by non-physician practitioner and as supervising physician I was immediately available for consultation/collaboration.  Philipp Deputy, M.D.  Harden Mo, MD 12/11/13 [phone]

## 2013-12-11 NOTE — Discharge Instructions (Signed)
Return here as needed.  Followup with your primary care Dr. use warm compresses on the area around your hernia.

## 2013-12-11 NOTE — ED Provider Notes (Signed)
Medical screening examination/treatment/procedure(s) were performed by non-physician practitioner and as supervising physician I was immediately available for consultation/collaboration.  Richarda Blade, MD 12/11/13 [phone]

## 2013-12-11 NOTE — ED Provider Notes (Signed)
CSN: [id number]     Arrival date & time 12/10/13  1824 History   First MD Initiated Contact with Patient 12/10/13 2121     Chief Complaint  Patient presents with  . Abdominal Pain     (Consider location/radiation/quality/duration/timing/severity/associated sxs/prior Treatment) HPI Patient presents to the emergency department with left-sided abdominal pain, over the last 2-3 days.  The patient, states, that he's had nausea, but no vomiting, or diarrhea.  The patient, states, that the pain seemed to radiate from his left abdomen to the suprapubic region.  Patient, states, that he was diagnosed with a urinary tract infection.  Patient, states, that he seems to make his condition, better or worse.  Patient denies chest pain, shortness of breath, weakness, dizziness, blurred vision, headache, back pain, neck pain, dysuria, fever, cough, rash, or syncope.  Patient, states, that nothing he took his condition, better Past Medical History  Diagnosis Date  . Obesity   . Pulmonary embolism 05/04/2012    bilaterally  . Exertional dyspnea 05/04/2012    "isolated episode" (05/05/2012)  . Sleep apnea     "pretty sure; never been tested" (05/05/2012)  . History of chickenpox   . DVT (deep venous thrombosis)   . Diabetes mellitus, type II 02/2013   Past Surgical History  Procedure Laterality Date  . No past surgeries     Family History  Problem Relation Age of Onset  . Diabetes Mother    History  Substance Use Topics  . Smoking status: Never Smoker   . Smokeless tobacco: Never Used     Comment: never used tobacco  . Alcohol Use: No    Review of Systems  All other systems negative except as documented in the HPI. All pertinent positives and negatives as reviewed in the HPI.  Allergies  Review of patient's allergies indicates no known allergies.  Home Medications   Prior to Admission medications   Medication Sig Start Date End Date Taking? Authorizing Provider  aspirin 81 MG tablet  Take 81 mg by mouth 2 (two) times daily.   Yes Historical Provider, MD  ciprofloxacin (CIPRO) 500 MG tablet Take 1 tablet (500 mg total) by mouth every 12 (twelve) hours. 12/02/13  Yes Liam Graham, PA-C  metFORMIN (GLUCOPHAGE) 500 MG tablet Take 500 mg by mouth 2 (two) times daily with a meal.   Yes Historical Provider, MD  Multiple Vitamin (MULTIVITAMIN WITH MINERALS) TABS tablet Take 1 tablet by mouth daily.   Yes Historical Provider, MD   BP 137/84  Pulse 93  Temp(Src) 99.6 F (37.6 C) (Oral)  Resp 20  SpO2 95% Physical Exam  Nursing note and vitals reviewed. Constitutional: He is oriented to person, place, and time. He appears well-developed and well-nourished. No distress.  HENT:  Head: Normocephalic and atraumatic.  Mouth/Throat: Oropharynx is clear and moist.  Eyes: Pupils are equal, round, and reactive to light.  Neck: Normal range of motion. Neck supple.  Pulmonary/Chest: Effort normal and breath sounds normal. No respiratory distress.  Abdominal: Soft. Normal appearance and bowel sounds are normal. He exhibits no distension.    Neurological: He is alert and oriented to person, place, and time.  Skin: Skin is warm and dry.    ED Course  Procedures (including critical care time) Labs Review Labs Reviewed  CBC WITH DIFFERENTIAL - Abnormal; Notable for the following:    WBC 17.2 (*)    Neutrophils Relative % 83 (*)    Neutro Abs 14.3 (*)    Lymphocytes Relative  10 (*)    Monocytes Absolute 1.2 (*)    All other components within normal limits  COMPREHENSIVE METABOLIC PANEL - Abnormal; Notable for the following:    Glucose, Bld 149 (*)    All other components within normal limits  URINALYSIS, ROUTINE W REFLEX MICROSCOPIC - Abnormal; Notable for the following:    Hgb urine dipstick MODERATE (*)    Ketones, ur 15 (*)    All other components within normal limits  URINE CULTURE  LIPASE, BLOOD  URINE MICROSCOPIC-ADD ON    Imaging Review Ct Abdomen Pelvis W  Contrast  12/10/2013   CLINICAL DATA:  Periumbilical abdominal pain.  Nausea and vomiting.  EXAM: CT ABDOMEN AND PELVIS WITH CONTRAST  TECHNIQUE: Multidetector CT imaging of the abdomen and pelvis was performed using the standard protocol following bolus administration of intravenous contrast.  CONTRAST:  138mL OMNIPAQUE IOHEXOL 300 MG/ML  SOLN  COMPARISON:  None.  FINDINGS: The visualized lung bases are clear.  There is diffuse fatty infiltration within the liver. Mild sparing is noted about the gallbladder fossa. The liver and spleen are otherwise unremarkable. The gallbladder is within normal limits. The pancreas and adrenal glands are unremarkable.  There is a 1.9 cm focus of mildly increased attenuation arising at the posterior aspect of the right kidney. Though this may reflect a cyst, renal ultrasound would be helpful for further evaluation. Minimal nonspecific perinephric stranding is noted bilaterally. An 8 mm nonobstructing stone is noted near the lower pole of the right kidney. There is no evidence of hydronephrosis. No obstructing ureteral stones are seen.  No free fluid is identified. The small bowel is unremarkable in appearance. The stomach is within normal limits. No acute vascular abnormalities are seen.  The appendix is normal in caliber, without evidence for appendicitis. The colon is largely decompressed. Minimal diverticulosis is noted along the sigmoid colon. There is no evidence for acute diverticulitis.  The bladder is mildly distended. A 7 mm stone within the bladder may reflect a recently passed stone. The prostate remains normal in size. No inguinal lymphadenopathy is seen.  Note is made of a moderate umbilical hernia, containing a relatively short segment of mid ileum, with associated soft tissue inflammation. There is no evidence for obstruction. Additional soft tissue inflammation is noted along the skin, likely reflecting mild associated cellulitis.  No acute osseous abnormalities are  identified.  IMPRESSION: 1. Moderate umbilical hernia, containing a relatively short segment of mid ileum, with associated soft tissue inflammation. No evidence for obstruction at this time. Additional soft tissue inflammation extends along the skin, likely reflecting mild associated cellulitis. 2. 1.9 cm focus of mildly increased attenuation at the posterior aspect of the right kidney. Though this may reflect a cyst, renal ultrasound would be helpful for further evaluation, to exclude a solid mass. 3. 8 mm nonobstructing stone near the lower pole of the right kidney. 4. Diffuse fatty infiltration within the liver. 5. 7 mm stone within the bladder may reflect a recently passed stone.   Electronically Signed   By: Garald Balding M.D.   On: 12/10/2013 23:06   Patient's pain, and symptoms most likely were related to A ureteral stone.  Patient does not have a urinary tract infection noted.  On our urine.  He does have hemoglobin noted in the urine.  The patient will be discharged home with pain medications and antibiotics.  For the cellulitis of his umbilical area.  The patient does not have any incarceration of the hernia.  Patient is advised followup with his primary care Dr. told to return here as needed    Brent General, PA-C 12/11/13 [phone]

## 2013-12-12 LAB — URINE CULTURE
Colony Count: NO GROWTH
Culture: NO GROWTH

## 2013-12-13 ENCOUNTER — Emergency Department (INDEPENDENT_AMBULATORY_CARE_PROVIDER_SITE_OTHER)
Admission: EM | Admit: 2013-12-13 | Discharge: 2013-12-13 | Disposition: A | Discharge status: Nursing Home (Includes ICF) | Payer: PRIVATE HEALTH INSURANCE | Source: Home / Self Care | Attending: Emergency Medicine | Admitting: Emergency Medicine

## 2013-12-13 ENCOUNTER — Encounter (HOSPITAL_COMMUNITY): Payer: PRIVATE HEALTH INSURANCE | Admitting: Anesthesiology

## 2013-12-13 ENCOUNTER — Inpatient Hospital Stay (HOSPITAL_COMMUNITY)
Admission: EM | Admit: 2013-12-13 | Discharge: 2013-12-22 | DRG: 353 | Disposition: A | Discharge status: Skilled Nursing Facility | Payer: PRIVATE HEALTH INSURANCE | Attending: General Surgery | Admitting: General Surgery

## 2013-12-13 ENCOUNTER — Encounter (HOSPITAL_COMMUNITY): Payer: Self-pay | Admitting: Emergency Medicine

## 2013-12-13 ENCOUNTER — Encounter (HOSPITAL_COMMUNITY): Admission: EM | Disposition: A | Discharge status: Skilled Nursing Facility | Payer: Self-pay | Source: Home / Self Care

## 2013-12-13 ENCOUNTER — Emergency Department (HOSPITAL_COMMUNITY): Payer: PRIVATE HEALTH INSURANCE

## 2013-12-13 ENCOUNTER — Inpatient Hospital Stay (HOSPITAL_COMMUNITY): Payer: PRIVATE HEALTH INSURANCE

## 2013-12-13 ENCOUNTER — Emergency Department (HOSPITAL_COMMUNITY): Payer: PRIVATE HEALTH INSURANCE | Admitting: Anesthesiology

## 2013-12-13 DIAGNOSIS — J9601 Acute respiratory failure with hypoxia: Secondary | ICD-10-CM

## 2013-12-13 DIAGNOSIS — L02219 Cutaneous abscess of trunk, unspecified: Secondary | ICD-10-CM

## 2013-12-13 DIAGNOSIS — I10 Essential (primary) hypertension: Secondary | ICD-10-CM | POA: Diagnosis present

## 2013-12-13 DIAGNOSIS — K420 Umbilical hernia with obstruction, without gangrene: Secondary | ICD-10-CM

## 2013-12-13 DIAGNOSIS — R000 Tachycardia, unspecified: Secondary | ICD-10-CM | POA: Diagnosis not present

## 2013-12-13 DIAGNOSIS — E871 Hypo-osmolality and hyponatremia: Secondary | ICD-10-CM

## 2013-12-13 DIAGNOSIS — E1165 Type 2 diabetes mellitus with hyperglycemia: Secondary | ICD-10-CM

## 2013-12-13 DIAGNOSIS — Z6841 Body Mass Index (BMI) 40.0 and over, adult: Secondary | ICD-10-CM

## 2013-12-13 DIAGNOSIS — M25469 Effusion, unspecified knee: Secondary | ICD-10-CM | POA: Diagnosis not present

## 2013-12-13 DIAGNOSIS — K560 Paralytic ileus: Secondary | ICD-10-CM | POA: Diagnosis not present

## 2013-12-13 DIAGNOSIS — L03311 Cellulitis of abdominal wall: Secondary | ICD-10-CM

## 2013-12-13 DIAGNOSIS — Z86711 Personal history of pulmonary embolism: Secondary | ICD-10-CM

## 2013-12-13 DIAGNOSIS — T4275XA Adverse effect of unspecified antiepileptic and sedative-hypnotic drugs, initial encounter: Secondary | ICD-10-CM | POA: Diagnosis present

## 2013-12-13 DIAGNOSIS — IMO0001 Reserved for inherently not codable concepts without codable children: Secondary | ICD-10-CM

## 2013-12-13 DIAGNOSIS — IMO0002 Reserved for concepts with insufficient information to code with codable children: Secondary | ICD-10-CM

## 2013-12-13 DIAGNOSIS — J9600 Acute respiratory failure, unspecified whether with hypoxia or hypercapnia: Secondary | ICD-10-CM

## 2013-12-13 DIAGNOSIS — K460 Unspecified abdominal hernia with obstruction, without gangrene: Secondary | ICD-10-CM

## 2013-12-13 DIAGNOSIS — D72829 Elevated white blood cell count, unspecified: Secondary | ICD-10-CM

## 2013-12-13 DIAGNOSIS — M1990 Unspecified osteoarthritis, unspecified site: Secondary | ICD-10-CM | POA: Diagnosis present

## 2013-12-13 DIAGNOSIS — J189 Pneumonia, unspecified organism: Secondary | ICD-10-CM | POA: Diagnosis not present

## 2013-12-13 DIAGNOSIS — N179 Acute kidney failure, unspecified: Secondary | ICD-10-CM | POA: Diagnosis present

## 2013-12-13 DIAGNOSIS — K436 Other and unspecified ventral hernia with obstruction, without gangrene: Secondary | ICD-10-CM | POA: Diagnosis present

## 2013-12-13 DIAGNOSIS — G2581 Restless legs syndrome: Secondary | ICD-10-CM | POA: Diagnosis present

## 2013-12-13 DIAGNOSIS — G4733 Obstructive sleep apnea (adult) (pediatric): Secondary | ICD-10-CM

## 2013-12-13 DIAGNOSIS — E119 Type 2 diabetes mellitus without complications: Secondary | ICD-10-CM | POA: Diagnosis present

## 2013-12-13 DIAGNOSIS — Z7982 Long term (current) use of aspirin: Secondary | ICD-10-CM

## 2013-12-13 DIAGNOSIS — N200 Calculus of kidney: Secondary | ICD-10-CM | POA: Diagnosis present

## 2013-12-13 DIAGNOSIS — L03319 Cellulitis of trunk, unspecified: Secondary | ICD-10-CM

## 2013-12-13 DIAGNOSIS — Z86718 Personal history of other venous thrombosis and embolism: Secondary | ICD-10-CM

## 2013-12-13 DIAGNOSIS — I2699 Other pulmonary embolism without acute cor pulmonale: Secondary | ICD-10-CM

## 2013-12-13 DIAGNOSIS — J95821 Acute postprocedural respiratory failure: Secondary | ICD-10-CM | POA: Diagnosis not present

## 2013-12-13 DIAGNOSIS — Z833 Family history of diabetes mellitus: Secondary | ICD-10-CM

## 2013-12-13 DIAGNOSIS — X58XXXA Exposure to other specified factors, initial encounter: Secondary | ICD-10-CM

## 2013-12-13 DIAGNOSIS — I82439 Acute embolism and thrombosis of unspecified popliteal vein: Secondary | ICD-10-CM

## 2013-12-13 DIAGNOSIS — E875 Hyperkalemia: Secondary | ICD-10-CM | POA: Diagnosis present

## 2013-12-13 DIAGNOSIS — A419 Sepsis, unspecified organism: Secondary | ICD-10-CM

## 2013-12-13 DIAGNOSIS — G9340 Encephalopathy, unspecified: Secondary | ICD-10-CM | POA: Diagnosis not present

## 2013-12-13 HISTORY — PX: LAPAROTOMY: SHX154

## 2013-12-13 LAB — CBC
HCT: 43.5 % (ref 39.0–52.0)
Hemoglobin: 14.7 g/dL (ref 13.0–17.0)
MCH: 29.9 pg (ref 26.0–34.0)
MCHC: 33.8 g/dL (ref 30.0–36.0)
MCV: 88.4 fL (ref 78.0–100.0)
Platelets: 253 10*3/uL (ref 150–400)
RBC: 4.92 MIL/uL (ref 4.22–5.81)
RDW: 14.2 % (ref 11.5–15.5)
WBC: 22.5 10*3/uL — ABNORMAL HIGH (ref 4.0–10.5)

## 2013-12-13 LAB — I-STAT CHEM 8, ED
BUN: 17 mg/dL (ref 6–23)
Calcium, Ion: 1.13 mmol/L (ref 1.12–1.23)
Chloride: 98 mEq/L (ref 96–112)
Creatinine, Ser: 1.4 mg/dL — ABNORMAL HIGH (ref 0.50–1.35)
Glucose, Bld: 122 mg/dL — ABNORMAL HIGH (ref 70–99)
HCT: 48 % (ref 39.0–52.0)
Hemoglobin: 16.3 g/dL (ref 13.0–17.0)
Potassium: 4.5 mEq/L (ref 3.7–5.3)
Sodium: 137 mEq/L (ref 137–147)
TCO2: 25 mmol/L (ref 0–100)

## 2013-12-13 LAB — BASIC METABOLIC PANEL
Anion gap: 15 (ref 5–15)
BUN: 16 mg/dL (ref 6–23)
CO2: 26 mEq/L (ref 19–32)
Calcium: 8.9 mg/dL (ref 8.4–10.5)
Chloride: 95 mEq/L — ABNORMAL LOW (ref 96–112)
Creatinine, Ser: 1.17 mg/dL (ref 0.50–1.35)
GFR calc Af Amer: 80 mL/min — ABNORMAL LOW (ref >90)
GFR calc non Af Amer: 69 mL/min — ABNORMAL LOW (ref >90)
Glucose, Bld: 114 mg/dL — ABNORMAL HIGH (ref 70–99)
Potassium: 4.6 mEq/L (ref 3.7–5.3)
Sodium: 136 mEq/L — ABNORMAL LOW (ref 137–147)

## 2013-12-13 SURGERY — LAPAROTOMY, EXPLORATORY
Anesthesia: General | Site: Abdomen

## 2013-12-13 MED ORDER — CLINDAMYCIN PHOSPHATE 600 MG/50ML IV SOLN
600.0000 mg | Freq: Once | INTRAVENOUS | Status: AC
  Administered 2013-12-13: 600 mg via INTRAVENOUS
  Filled 2013-12-13: qty 50

## 2013-12-13 MED ORDER — LACTATED RINGERS IV SOLN
INTRAVENOUS | Status: DC | PRN
  Administered 2013-12-13: 21:00:00 via INTRAVENOUS

## 2013-12-13 MED ORDER — SODIUM CHLORIDE 0.9 % IJ SOLN: 9.0000 mL | INTRAMUSCULAR | Status: DC | PRN

## 2013-12-13 MED ORDER — ONDANSETRON HCL 4 MG/2ML IJ SOLN
INTRAMUSCULAR | Status: AC
  Filled 2013-12-13: qty 2

## 2013-12-13 MED ORDER — PROPOFOL 10 MG/ML IV BOLUS
INTRAVENOUS | Status: AC
  Filled 2013-12-13: qty 20

## 2013-12-13 MED ORDER — SUCCINYLCHOLINE CHLORIDE 20 MG/ML IJ SOLN
INTRAMUSCULAR | Status: DC | PRN
  Administered 2013-12-13: 140 mg via INTRAVENOUS

## 2013-12-13 MED ORDER — 0.9 % SODIUM CHLORIDE (POUR BTL) OPTIME
TOPICAL | Status: DC | PRN
  Administered 2013-12-13: 1000 mL

## 2013-12-13 MED ORDER — NALOXONE HCL 0.4 MG/ML IJ SOLN: 0.4000 mg | INTRAMUSCULAR | Status: DC | PRN

## 2013-12-13 MED ORDER — PROPOFOL 10 MG/ML IV BOLUS
INTRAVENOUS | Status: DC | PRN
  Administered 2013-12-13: 150 mg via INTRAVENOUS

## 2013-12-13 MED ORDER — ONDANSETRON HCL 4 MG/2ML IJ SOLN
INTRAMUSCULAR | Status: DC | PRN
  Administered 2013-12-13: 4 mg via INTRAVENOUS

## 2013-12-13 MED ORDER — ARTIFICIAL TEARS OP OINT
TOPICAL_OINTMENT | OPHTHALMIC | Status: DC | PRN
  Administered 2013-12-13: 1 via OPHTHALMIC

## 2013-12-13 MED ORDER — FENTANYL CITRATE 0.05 MG/ML IJ SOLN
INTRAMUSCULAR | Status: DC | PRN
  Administered 2013-12-13: 50 ug via INTRAVENOUS
  Administered 2013-12-13: 100 ug via INTRAVENOUS
  Administered 2013-12-13 (×7): 50 ug via INTRAVENOUS

## 2013-12-13 MED ORDER — FENTANYL BOLUS VIA INFUSION
50.0000 ug | INTRAVENOUS | Status: DC | PRN
  Filled 2013-12-13: qty 100

## 2013-12-13 MED ORDER — FENTANYL CITRATE 0.05 MG/ML IJ SOLN
50.0000 ug | Freq: Once | INTRAMUSCULAR | Status: AC
  Administered 2013-12-14: 50 ug via INTRAVENOUS
  Filled 2013-12-13: qty 2

## 2013-12-13 MED ORDER — STERILE WATER FOR INJECTION IJ SOLN
INTRAMUSCULAR | Status: AC
  Filled 2013-12-13: qty 10

## 2013-12-13 MED ORDER — VECURONIUM BROMIDE 10 MG IV SOLR
INTRAVENOUS | Status: AC
  Filled 2013-12-13: qty 10

## 2013-12-13 MED ORDER — DIPHENHYDRAMINE HCL 50 MG/ML IJ SOLN: 12.5000 mg | Freq: Four times a day (QID) | INTRAMUSCULAR | Status: DC | PRN

## 2013-12-13 MED ORDER — ONDANSETRON HCL 4 MG/2ML IJ SOLN
4.0000 mg | Freq: Once | INTRAMUSCULAR | Status: AC
  Administered 2013-12-13: 4 mg via INTRAVENOUS

## 2013-12-13 MED ORDER — POTASSIUM CHLORIDE IN NACL 20-0.9 MEQ/L-% IV SOLN
INTRAVENOUS | Status: DC
  Administered 2013-12-14: 1 mL via INTRAVENOUS
  Filled 2013-12-13 (×3): qty 1000

## 2013-12-13 MED ORDER — PHENYLEPHRINE 40 MCG/ML (10ML) SYRINGE FOR IV PUSH (FOR BLOOD PRESSURE SUPPORT)
PREFILLED_SYRINGE | INTRAVENOUS | Status: AC
  Filled 2013-12-13: qty 10

## 2013-12-13 MED ORDER — PIPERACILLIN-TAZOBACTAM 3.375 G IVPB
3.3750 g | Freq: Three times a day (TID) | INTRAVENOUS | Status: AC
  Administered 2013-12-14 – 2013-12-20 (×21): 3.375 g via INTRAVENOUS
  Filled 2013-12-13 (×21): qty 50

## 2013-12-13 MED ORDER — SODIUM CHLORIDE 0.9 % IV SOLN
0.0000 ug/h | INTRAVENOUS | Status: DC
  Administered 2013-12-14: 50 ug/h via INTRAVENOUS
  Filled 2013-12-13: qty 50

## 2013-12-13 MED ORDER — FENTANYL CITRATE 0.05 MG/ML IJ SOLN
INTRAMUSCULAR | Status: AC
  Filled 2013-12-13: qty 5

## 2013-12-13 MED ORDER — ONDANSETRON HCL 4 MG/2ML IJ SOLN: 4.0000 mg | Freq: Four times a day (QID) | INTRAMUSCULAR | Status: DC | PRN

## 2013-12-13 MED ORDER — HYDROMORPHONE 0.3 MG/ML IV SOLN: INTRAVENOUS | Status: DC

## 2013-12-13 MED ORDER — ENOXAPARIN SODIUM 40 MG/0.4ML ~~LOC~~ SOLN
40.0000 mg | SUBCUTANEOUS | Status: DC
  Administered 2013-12-14 – 2013-12-15 (×2): 40 mg via SUBCUTANEOUS
  Filled 2013-12-13 (×3): qty 0.4

## 2013-12-13 MED ORDER — VECURONIUM BROMIDE 10 MG IV SOLR
INTRAVENOUS | Status: DC | PRN
  Administered 2013-12-13: 2 mg via INTRAVENOUS
  Administered 2013-12-13: 3 mg via INTRAVENOUS
  Administered 2013-12-13: 7 mg via INTRAVENOUS
  Administered 2013-12-13: 10 mg via INTRAVENOUS

## 2013-12-13 MED ORDER — LIDOCAINE HCL (CARDIAC) 20 MG/ML IV SOLN
INTRAVENOUS | Status: AC
  Filled 2013-12-13: qty 5

## 2013-12-13 MED ORDER — INSULIN DETEMIR 100 UNIT/ML ~~LOC~~ SOLN
15.0000 [IU] | Freq: Every day | SUBCUTANEOUS | Status: DC
  Administered 2013-12-14 – 2013-12-21 (×9): 15 [IU] via SUBCUTANEOUS
  Filled 2013-12-13 (×11): qty 0.15

## 2013-12-13 MED ORDER — INSULIN ASPART 100 UNIT/ML ~~LOC~~ SOLN
0.0000 [IU] | SUBCUTANEOUS | Status: DC
  Administered 2013-12-14: 3 [IU] via SUBCUTANEOUS
  Administered 2013-12-14: 4 [IU] via SUBCUTANEOUS
  Administered 2013-12-14: 3 [IU] via SUBCUTANEOUS
  Administered 2013-12-14: 4 [IU] via SUBCUTANEOUS
  Administered 2013-12-14 – 2013-12-15 (×4): 3 [IU] via SUBCUTANEOUS
  Administered 2013-12-15 – 2013-12-16 (×3): 4 [IU] via SUBCUTANEOUS
  Administered 2013-12-16 – 2013-12-18 (×7): 3 [IU] via SUBCUTANEOUS
  Administered 2013-12-19: 4 [IU] via SUBCUTANEOUS
  Administered 2013-12-19 (×2): 3 [IU] via SUBCUTANEOUS

## 2013-12-13 MED ORDER — NEOSTIGMINE METHYLSULFATE 10 MG/10ML IV SOLN
INTRAVENOUS | Status: DC | PRN
  Administered 2013-12-13: 4 mg via INTRAVENOUS
  Administered 2013-12-13: 1 mg via INTRAVENOUS

## 2013-12-13 MED ORDER — SUCCINYLCHOLINE CHLORIDE 20 MG/ML IJ SOLN
INTRAMUSCULAR | Status: AC
  Filled 2013-12-13: qty 1

## 2013-12-13 MED ORDER — POVIDONE-IODINE 10 % EX OINT
TOPICAL_OINTMENT | CUTANEOUS | Status: AC
  Filled 2013-12-13: qty 28.35

## 2013-12-13 MED ORDER — SODIUM CHLORIDE 0.9 % IV BOLUS (SEPSIS)
1000.0000 mL | Freq: Once | INTRAVENOUS | Status: AC
  Administered 2013-12-13: 1000 mL via INTRAVENOUS

## 2013-12-13 MED ORDER — LACTATED RINGERS IV SOLN
INTRAVENOUS | Status: DC | PRN
  Administered 2013-12-13 (×3): via INTRAVENOUS

## 2013-12-13 MED ORDER — ARTIFICIAL TEARS OP OINT
TOPICAL_OINTMENT | OPHTHALMIC | Status: AC
  Filled 2013-12-13: qty 3.5

## 2013-12-13 MED ORDER — MIDAZOLAM HCL 2 MG/2ML IJ SOLN
INTRAMUSCULAR | Status: AC
  Filled 2013-12-13: qty 2

## 2013-12-13 MED ORDER — PIPERACILLIN-TAZOBACTAM 3.375 G IVPB 30 MIN
3.3750 g | Freq: Four times a day (QID) | INTRAVENOUS | Status: DC
  Administered 2013-12-13: 3.375 g via INTRAVENOUS
  Filled 2013-12-13: qty 50

## 2013-12-13 MED ORDER — MIDAZOLAM HCL 5 MG/5ML IJ SOLN
INTRAMUSCULAR | Status: DC | PRN
  Administered 2013-12-13 (×2): 2 mg via INTRAVENOUS

## 2013-12-13 MED ORDER — DIPHENHYDRAMINE HCL 12.5 MG/5ML PO ELIX
12.5000 mg | ORAL_SOLUTION | Freq: Four times a day (QID) | ORAL | Status: DC | PRN
  Filled 2013-12-13: qty 5

## 2013-12-13 MED ORDER — GLYCOPYRROLATE 0.2 MG/ML IJ SOLN
INTRAMUSCULAR | Status: DC | PRN
  Administered 2013-12-13: 0.2 mg via INTRAVENOUS
  Administered 2013-12-13: 0.6 mg via INTRAVENOUS

## 2013-12-13 MED ORDER — LIDOCAINE HCL (CARDIAC) 20 MG/ML IV SOLN
INTRAVENOUS | Status: DC | PRN
Start: 2013-12-13 — End: 2013-12-13
  Administered 2013-12-13: 100 mg via INTRAVENOUS

## 2013-12-13 SURGICAL SUPPLY — 52 items
BANDAGE GAUZE ELAST BULKY 4 IN (GAUZE/BANDAGES/DRESSINGS) ×2 IMPLANT
BINDER ABD UNIV 10 28-50 (GAUZE/BANDAGES/DRESSINGS) ×1 IMPLANT
BINDER ABDOM UNIV 10 (GAUZE/BANDAGES/DRESSINGS) ×2
BLADE SURG ROTATE 9660 (MISCELLANEOUS) IMPLANT
BNDG GAUZE ELAST 4 BULKY (GAUZE/BANDAGES/DRESSINGS) ×2 IMPLANT
CANISTER SUCTION 2500CC (MISCELLANEOUS) ×2 IMPLANT
CHLORAPREP W/TINT 26ML (MISCELLANEOUS) ×2 IMPLANT
COVER MAYO STAND STRL (DRAPES) ×2 IMPLANT
COVER SURGICAL LIGHT HANDLE (MISCELLANEOUS) ×2 IMPLANT
DRAPE LAPAROSCOPIC ABDOMINAL (DRAPES) ×2 IMPLANT
DRAPE PROXIMA HALF (DRAPES) IMPLANT
DRAPE UTILITY 15X26 W/TAPE STR (DRAPE) ×4 IMPLANT
DRAPE WARM FLUID 44X44 (DRAPE) ×2 IMPLANT
DRSG OPSITE POSTOP 4X10 (GAUZE/BANDAGES/DRESSINGS) IMPLANT
DRSG OPSITE POSTOP 4X8 (GAUZE/BANDAGES/DRESSINGS) IMPLANT
DRSG PAD ABDOMINAL 8X10 ST (GAUZE/BANDAGES/DRESSINGS) ×4 IMPLANT
ELECT BLADE 6.5 EXT (BLADE) ×2 IMPLANT
ELECT CAUTERY BLADE 6.4 (BLADE) ×4 IMPLANT
ELECT REM PT RETURN 9FT ADLT (ELECTROSURGICAL) ×2
ELECTRODE REM PT RTRN 9FT ADLT (ELECTROSURGICAL) ×1 IMPLANT
GLOVE BIO SURGEON STRL SZ7 (GLOVE) ×2 IMPLANT
GLOVE BIOGEL PI IND STRL 7.5 (GLOVE) ×1 IMPLANT
GLOVE BIOGEL PI IND STRL 8 (GLOVE) ×1 IMPLANT
GLOVE BIOGEL PI INDICATOR 7.5 (GLOVE) ×1
GLOVE BIOGEL PI INDICATOR 8 (GLOVE) ×1
GLOVE ECLIPSE 7.5 STRL STRAW (GLOVE) ×2 IMPLANT
GOWN STRL REUS W/ TWL LRG LVL3 (GOWN DISPOSABLE) ×3 IMPLANT
GOWN STRL REUS W/TWL LRG LVL3 (GOWN DISPOSABLE) ×3
KIT BASIN OR (CUSTOM PROCEDURE TRAY) ×2 IMPLANT
KIT ROOM TURNOVER OR (KITS) ×2 IMPLANT
LIGASURE IMPACT 36 18CM CVD LR (INSTRUMENTS) ×2 IMPLANT
NS IRRIG 1000ML POUR BTL (IV SOLUTION) ×4 IMPLANT
PACK GENERAL/GYN (CUSTOM PROCEDURE TRAY) ×2 IMPLANT
PAD ARMBOARD 7.5X6 YLW CONV (MISCELLANEOUS) ×4 IMPLANT
PENCIL BUTTON HOLSTER BLD 10FT (ELECTRODE) IMPLANT
SEPRAFILM PROCEDURAL PACK 3X5 (MISCELLANEOUS) IMPLANT
SPECIMEN JAR LARGE (MISCELLANEOUS) IMPLANT
SPONGE LAP 18X18 X RAY DECT (DISPOSABLE) ×4 IMPLANT
STAPLER VISISTAT 35W (STAPLE) IMPLANT
SUCTION POOLE TIP (SUCTIONS) ×2 IMPLANT
SUT NOVA 1 T20/GS 25DT (SUTURE) ×6 IMPLANT
SUT NOVA T20/GS 25 (SUTURE) ×4 IMPLANT
SUT PDS AB 1 TP1 96 (SUTURE) IMPLANT
SUT SILK 2 0 SH CR/8 (SUTURE) ×2 IMPLANT
SUT SILK 2 0 TIES 10X30 (SUTURE) ×2 IMPLANT
SUT SILK 3 0 SH CR/8 (SUTURE) IMPLANT
SUT SILK 3 0 TIES 10X30 (SUTURE) IMPLANT
TAPE CLOTH SURG 6X10 WHT LF (GAUZE/BANDAGES/DRESSINGS) ×2 IMPLANT
TOWEL OR 17X26 10 PK STRL BLUE (TOWEL DISPOSABLE) ×2 IMPLANT
TRAY FOLEY CATH 16FRSI W/METER (SET/KITS/TRAYS/PACK) ×2 IMPLANT
TUBE CONNECTING 12X1/4 (SUCTIONS) IMPLANT
YANKAUER SUCT BULB TIP NO VENT (SUCTIONS) IMPLANT

## 2013-12-13 NOTE — ED Notes (Signed)
[facility] has been called.

## 2013-12-13 NOTE — Progress Notes (Signed)
53yo male was at [facility] 7/18 and was tx'd for kidney stone, returned today c/o worsening cellulitis, causea, malaise, constipation, and erythema, sent to ED for further eval, Xray concerning for SBO related to longstanding umbilical hernia, now s/p ex lap and repair, to begin IV ABX for infection.  Rec'd Zosyn 3.375g IV perioperatively; will continue with Zosyn 3.375g IV Q8H for CrCl >171ml/min and monitor CBC and Cx.  Wynona Neat, PharmD, BCPS 12/13/2013 11:13 PM

## 2013-12-13 NOTE — ED Notes (Signed)
Pt states starting of cellulitis at umbilicus. Today c/o nausea, generalized malaise and increased area of erythremia. Pt sent from urgent care via [facility].

## 2013-12-13 NOTE — H&P (Signed)
Evan Leonard is an 53 y.o. male.   Chief Complaint: Worsening abdominal pain, nausea HPI: 53 yo male with DM and morbid obesity presents with a long-standing umbilical hernia but recent worsening of the periumbilical tenderness.  He developed redness in this area and was evaluated in the ED on 12/10/13.  He underwent CT scan which showed a large umbilical hernia containing a loop of small bowel but no sign of obstruction.  There were obvious signs of inflammation on the scan.  At that time, his WBC was 17.  He returns today with worsening abdominal pain, nausea, and subjective fever.  The erythema has worsened significantly and the firmness and tenderness around his umbilicus has worsened significantly.  WBC is now 22.  Plain films show signs of obstruction.  Unfortunately, the patient was given ginger ale just prior to my evaluation in the ED. Past Medical History  Diagnosis Date  . Obesity   . Pulmonary embolism 05/04/2012    bilaterally  . Exertional dyspnea 05/04/2012    "isolated episode" (05/05/2012)  . Sleep apnea     "pretty sure; never been tested" (05/05/2012)  . History of chickenpox   . DVT (deep venous thrombosis)   . Diabetes mellitus, type II 02/2013    Past Surgical History  Procedure Laterality Date  . No past surgeries      Family History  Problem Relation Age of Onset  . Diabetes Mother    Social History:  reports that he has never smoked. He has never used smokeless tobacco. He reports that he does not drink alcohol or use illicit drugs.  Allergies: No Known Allergies  Prior to Admission medications   Medication Sig Start Date End Date Taking? Authorizing Provider  aspirin EC 81 MG tablet Take 81 mg by mouth 2 (two) times daily.   Yes Historical Provider, MD  metFORMIN (GLUCOPHAGE) 500 MG tablet Take 500 mg by mouth 2 (two) times daily with a meal.   Yes Historical Provider, MD  Multiple Vitamin (MULTIVITAMIN WITH MINERALS) TABS tablet Take 1 tablet by mouth  daily.   Yes Historical Provider, MD  oxyCODONE-acetaminophen (PERCOCET/ROXICET) 5-325 MG per tablet Take 1 tablet by mouth every 6 (six) hours as needed for severe pain.   Yes Historical Provider, MD  promethazine (PHENERGAN) 25 MG tablet Take 25 mg by mouth every 6 (six) hours as needed for nausea or vomiting.   Yes Historical Provider, MD  sulfamethoxazole-trimethoprim (BACTRIM DS) 800-160 MG per tablet Take 2 tablets by mouth 2 (two) times daily.   Yes Historical Provider, MD     Results for orders placed during the hospital encounter of 12/13/13 (from the past 48 hour(s))  BASIC METABOLIC PANEL     Status: Abnormal   Collection Time    12/13/13  5:02 PM      Result Value Ref Range   Sodium 136 (*) 137 - 147 mEq/L   Potassium 4.6  3.7 - 5.3 mEq/L   Chloride 95 (*) 96 - 112 mEq/L   CO2 26  19 - 32 mEq/L   Glucose, Bld 114 (*) 70 - 99 mg/dL   BUN 16  6 - 23 mg/dL   Creatinine, Ser 1.17  0.50 - 1.35 mg/dL   Calcium 8.9  8.4 - 10.5 mg/dL   GFR calc non Af Amer 69 (*) >90 mL/min   GFR calc Af Amer 80 (*) >90 mL/min   Comment: (NOTE)     The eGFR has been calculated using the CKD EPI  equation.     This calculation has not been validated in all clinical situations.     eGFR's persistently <90 mL/min signify possible Chronic Kidney     Disease.   Anion gap 15  5 - 15  CBC     Status: Abnormal   Collection Time    12/13/13  5:02 PM      Result Value Ref Range   WBC 22.5 (*) 4.0 - 10.5 K/uL   RBC 4.92  4.22 - 5.81 MIL/uL   Hemoglobin 14.7  13.0 - 17.0 g/dL   HCT 25.2  91.6 - 36.9 %   MCV 88.4  78.0 - 100.0 fL   MCH 29.9  26.0 - 34.0 pg   MCHC 33.8  30.0 - 36.0 g/dL   RDW 38.0  29.6 - 86.9 %   Platelets 253  150 - 400 K/uL  I-STAT CHEM 8, ED     Status: Abnormal   Collection Time    12/13/13  5:26 PM      Result Value Ref Range   Sodium 137  137 - 147 mEq/L   Potassium 4.5  3.7 - 5.3 mEq/L   Chloride 98  96 - 112 mEq/L   BUN 17  6 - 23 mg/dL   Creatinine, Ser 5.79 (*) 0.50 -  1.35 mg/dL   Glucose, Bld 238 (*) 70 - 99 mg/dL   Calcium, Ion 2.50  9.28 - 1.23 mmol/L   TCO2 25  0 - 100 mmol/L   Hemoglobin 16.3  13.0 - 17.0 g/dL   HCT 80.1  32.7 - 20.5 %   Dg Abd 2 Views  12/13/2013   CLINICAL DATA:  Abdominal pain.  No bowel movement for 2 weeks.  EXAM: ABDOMEN - 2 VIEW  COMPARISON:  Abdominal pelvic CT 12/10/2013.  FINDINGS: There is interval development of moderate diffuse small bowel distension with associated air-fluid levels on the erect examination. The colon appears relatively decompressed. There is no evidence of free intraperitoneal air. Calculi in the lower pole of the right kidney and in the bladder are again noted. There are stable degenerative changes of the spine.  IMPRESSION: 1. Interval development of small bowel distension with air-fluid levels concerning for small bowel obstruction, possibly from the umbilical hernia containing small bowel on previous CT. 2. Grossly stable right renal and bladder calculi.   Electronically Signed   By: Roxy Horseman M.D.   On: 12/13/2013 18:53    Review of Systems  Constitutional: Positive for fever. Negative for weight loss.  HENT: Negative for ear discharge, ear pain, hearing loss and tinnitus.   Eyes: Negative for blurred vision, double vision, photophobia and pain.  Respiratory: Negative for cough, sputum production and shortness of breath.   Cardiovascular: Negative for chest pain.  Gastrointestinal: Positive for nausea, abdominal pain and constipation. Negative for vomiting.  Genitourinary: Negative for dysuria, urgency, frequency and flank pain.  Musculoskeletal: Negative for back pain, falls, joint pain, myalgias and neck pain.  Neurological: Negative for dizziness, tingling, sensory change, focal weakness, loss of consciousness and headaches.  Endo/Heme/Allergies: Does not bruise/bleed easily.  Psychiatric/Behavioral: Negative for depression, memory loss and substance abuse. The patient is not nervous/anxious.      Blood pressure 119/75, pulse 94, temperature 99.7 F (37.6 C), temperature source Oral, SpO2 94.00%. Physical Exam  WDWN in NAD HEENT:  EOMI, sclera anicteric Neck:  No masses, no thyromegaly Lungs:  CTA bilaterally; normal respiratory effort CV:  Regular rate and rhythm; no murmurs Abd:  Obese, distended; deep erythema from umbilicus down over pannus to suprapubic region; pannus does not seem to be indurated.  Very large incarcerated, discolored umbilical hernia - very tender to palpation Ext:  Well-perfused; no edema Skin:  Warm, dry; no sign of jaundice  Assessment/Plan Incarcerated, possible strangulated umbilical hernia with large amount of surrounding soft tissue infection.  Proceed emergently to the OR for exploratory laparotomy, possible small bowel resection, repair of incarcerated umbilical hernia.  The surgical procedure has been discussed with the patient.  Potential risks, benefits, alternative treatments, and expected outcomes have been explained.  All of the patient's questions at this time have been answered. The patient understand the proposed surgical procedure and wishes to proceed.     Nitasha Jewel K. 12/13/2013, 7:49 PM

## 2013-12-13 NOTE — Anesthesia Preprocedure Evaluation (Addendum)
Anesthesia Evaluation  Patient identified by MRN, date of birth, ID band Patient awake    Reviewed: Allergy & Precautions, H&P , NPO status , Patient's Chart, lab work & pertinent test results  History of Anesthesia Complications (+) MALIGNANT HYPERTHERMIA  Airway Mallampati: II TM Distance: <3 FB Neck ROM: Full  Mouth opening: Limited Mouth Opening  Dental  (+) Dental Advisory Given, Poor Dentition,    Pulmonary shortness of breath, sleep apnea ,  History of PE noted. CE breath sounds clear to auscultation        Cardiovascular + Peripheral Vascular Disease Rhythm:Regular Rate:Normal     Neuro/Psych    GI/Hepatic Neg liver ROS, GI history noted.   Endo/Other  diabetes  Renal/GU negative Renal ROS     Musculoskeletal   Abdominal   Peds  Hematology   Anesthesia Other Findings   Reproductive/Obstetrics                          Anesthesia Physical Anesthesia Plan  ASA: III and emergent  Anesthesia Plan: General   Post-op Pain Management:    Induction: Intravenous, Rapid sequence and Cricoid pressure planned  Airway Management Planned: Oral ETT  Additional Equipment:   Intra-op Plan:   Post-operative Plan: Possible Post-op intubation/ventilation  Informed Consent: I have reviewed the patients History and Physical, chart, labs and discussed the procedure including the risks, benefits and alternatives for the proposed anesthesia with the patient or authorized representative who has indicated his/her understanding and acceptance.   Dental advisory given  Plan Discussed with: CRNA and Anesthesiologist  Anesthesia Plan Comments:         Anesthesia Quick Evaluation

## 2013-12-13 NOTE — ED Provider Notes (Signed)
CSN: [id number]     Arrival date & time 12/13/13  1455 History   First MD Initiated Contact with Patient 12/13/13 1558     Chief Complaint  Patient presents with  . Abdominal Pain   (Consider location/radiation/quality/duration/timing/severity/associated sxs/prior Treatment) HPI Comments: 53 year old male presents complaining of abdominal pain, constipation, nausea, vomiting. He was seen here 3 days ago and was transferred to the emergency department for possible incarcerated hernia. There, he was found to have an umbilical hernia containing a portion of ileum with overlying cellulitis, and a bladder stone which may have represented a recently passed ureteral stone. He was treated with Bactrim, Phenergan, analgesics, and told to followup if not improving. Today, he continues to vomit despite the Phenergan, he has not had a bowel movement in 4 days which is very abnormal for him, he has subjective fever and chills, dizziness, lightheadedness, and feels overall very poorly he has taken the antibiotics as prescribed, as well as the antiemetics. He has not taken much of the Percocet because the pain is not that bad. He has also noted increasing blood in the urine.  Patient is a 53 y.o. male presenting with abdominal pain.  Abdominal Pain Associated symptoms: chills, constipation, fatigue, fever, hematuria, nausea and vomiting   Associated symptoms: no chest pain, no cough, no diarrhea, no dysuria, no shortness of breath and no sore throat     Past Medical History  Diagnosis Date  . Obesity   . Pulmonary embolism 05/04/2012    bilaterally  . Exertional dyspnea 05/04/2012    "isolated episode" (05/05/2012)  . Sleep apnea     "pretty sure; never been tested" (05/05/2012)  . History of chickenpox   . DVT (deep venous thrombosis)   . Diabetes mellitus, type II 02/2013   Past Surgical History  Procedure Laterality Date  . No past surgeries     Family History  Problem Relation Age of Onset  .  Diabetes Mother    History  Substance Use Topics  . Smoking status: Never Smoker   . Smokeless tobacco: Never Used     Comment: never used tobacco  . Alcohol Use: No    Review of Systems  Constitutional: Positive for fever, chills, diaphoresis and fatigue.  HENT: Negative for sore throat.   Eyes: Negative for visual disturbance.  Respiratory: Negative for cough and shortness of breath.   Cardiovascular: Negative for chest pain, palpitations and leg swelling.  Gastrointestinal: Positive for nausea, vomiting, abdominal pain and constipation. Negative for diarrhea.  Genitourinary: Positive for hematuria. Negative for dysuria, urgency, frequency and flank pain.  Musculoskeletal: Negative for arthralgias, myalgias, neck pain and neck stiffness.  Skin: Negative for rash.  Neurological: Positive for dizziness and light-headedness. Negative for weakness.    Allergies  Review of patient's allergies indicates no known allergies.  Home Medications   Prior to Admission medications   Medication Sig Start Date End Date Taking? Authorizing Provider  aspirin 81 MG tablet Take 81 mg by mouth 2 (two) times daily.   Yes Historical Provider, MD  metFORMIN (GLUCOPHAGE) 500 MG tablet Take 500 mg by mouth 2 (two) times daily with a meal.   Yes Historical Provider, MD  ciprofloxacin (CIPRO) 500 MG tablet Take 1 tablet (500 mg total) by mouth every 12 (twelve) hours. 12/02/13   Liam Graham, PA-C  Multiple Vitamin (MULTIVITAMIN WITH MINERALS) TABS tablet Take 1 tablet by mouth daily.    Historical Provider, MD  oxyCODONE-acetaminophen (PERCOCET/ROXICET) 5-325 MG per tablet Take 1  tablet by mouth every 6 (six) hours as needed for severe pain. 12/11/13   [place], PA-C  promethazine (PHENERGAN) 25 MG tablet Take 1 tablet (25 mg total) by mouth every 8 (eight) hours as needed for nausea or vomiting. 12/11/13   Resa Miner Lawyer, PA-C  sulfamethoxazole-trimethoprim (SEPTRA DS) 800-160 MG per  tablet Take 2 tablets by mouth 2 (two) times daily. 12/11/13   Resa Miner Lawyer, PA-C   BP 134/79  Pulse 116  Temp(Src) 99.3 F (37.4 C) (Oral)  Resp 20  SpO2 99% Physical Exam  Nursing note and vitals reviewed. Constitutional: He is oriented to person, place, and time. He appears well-developed and well-nourished. No distress.  HENT:  Head: Normocephalic.  Cardiovascular: Tachycardia present.   Pulmonary/Chest: Effort normal. No respiratory distress.  Abdominal: Soft. There is tenderness. There is no rebound and no guarding.    Neurological: He is alert and oriented to person, place, and time. Coordination normal.  Skin: Skin is warm and dry. No rash noted. He is not diaphoretic.  Psychiatric: He has a normal mood and affect. Judgment normal.    ED Course  Procedures (including critical care time) Labs Review Labs Reviewed - No data to display  Imaging Review No results found.   MDM   1. Cellulitis, abdominal wall   2. Sepsis, due to unspecified organism    Patient has cellulitis and sepsis, and evidence of bowel obstruction. At his last visit he was sent to the emergency department, a CT scan revealed an umbilical hernia that contained a portion of ileum. He has not had a bowel movement in 4 days which is very abnormal for him, therefore this probably represented an incarcerated hernia. He now has cellulitis and abscess vs incarcerated gangrenous umbilical hernia, causing sepsis, IVF started, transferred to ED via [place], PA-C 12/13/13 1615

## 2013-12-13 NOTE — ED Notes (Addendum)
Pt being transferred to OR via NT; Consent form transferred up with patient.

## 2013-12-13 NOTE — ED Notes (Signed)
Pt c/o abd pain  x 5 day Seen here on 7/17 and was sent to [facility] ED Was treated for Kidney stone Pain and swelling are getting worse Sx also include: LH/dizzienss, f/v Last BM was 5 days ago Alert w/no signs of acute distress.

## 2013-12-13 NOTE — Transfer of Care (Signed)
Immediate Anesthesia Transfer of Care Note  [name]  Procedure(s) Performed: Procedure(s): EXPLORATORY LAPAROTOMY Repair ventral hernia, without mesh, partial omentectomy (N/A)  Patient Location: SICU  Anesthesia Type:General  Level of Consciousness: Patient remains intubated per anesthesia plan  Airway & Oxygen Therapy: Patient remains intubated per anesthesia plan and Patient placed on Ventilator (see vital sign flow sheet for setting)  Post-op Assessment: Report given to PACU RN and Post -op Vital signs reviewed and stable  Post vital signs: Reviewed and stable  Complications: No apparent anesthesia complications

## 2013-12-13 NOTE — Anesthesia Procedure Notes (Addendum)
Procedure Name: Intubation Date/Time: 12/13/2013 9:00 PM Performed by: Vaughan Browner Pre-anesthesia Checklist: Patient identified, Emergency Drugs available, Suction available and Patient being monitored Patient Re-evaluated:Patient Re-evaluated prior to inductionOxygen Delivery Method: Circle system utilized Preoxygenation: Pre-oxygenation with 100% oxygen Intubation Type: IV induction, Rapid sequence and Cricoid Pressure applied Grade View: Grade I Tube type: Subglottic suction tube Tube size: 7.5 mm Number of attempts: 1 Airway Equipment and Method: Video-laryngoscopy Placement Confirmation: ETT inserted through vocal cords under direct vision,  positive ETCO2 and breath sounds checked- equal and bilateral Secured at: 24 cm Tube secured with: Tape Dental Injury: Teeth and Oropharynx as per pre-operative assessment  Comments: Elective glidescope r/t pt's body habitus.      Procedure Name: Intubation Date/Time: 12/13/2013 10:38 PM Performed by: Vaughan Browner Pre-anesthesia Checklist: Patient identified and Emergency Drugs available Patient Re-evaluated:Patient Re-evaluated prior to inductionIntubation Type: IV induction, Rapid sequence and Cricoid Pressure applied Grade View: Grade I Tube type: Subglottic suction tube Tube size: 8.0 mm Number of attempts: 2 Airway Equipment and Method: Video-laryngoscopy Placement Confirmation: ETT inserted through vocal cords under direct vision,  positive ETCO2 and breath sounds checked- equal and bilateral Secured at: 24 cm Tube secured with: Tape Dental Injury: Teeth and Oropharynx as per pre-operative assessment  Comments: Pt extubated.  Vt prior to extubation 600-790.  Pt took 2 deep breaths, then respirations became shallow and ineffective.  Dr. Oletta Lamas called by circulating RN.  Pt reintubated using Glidescope - +/= BS, +ETCO2.  Leak noticed in ETT cuff.  Once pt SaO2 normalized, ETT exchanged with bougie and glidescope visualization.  Oral  pharynx suctioned prior to bougie exchange.  Upon placement of bougie - gastric secretions noted in oral pharnyx.  Once ETT secured thru cords, NGT placed back to suction and mouth suctioned.  Suctioned via ETT prior to tube exchange and after ETT secured.

## 2013-12-13 NOTE — Op Note (Signed)
OPERATIVE REPORT  DATE OF OPERATION: 12/13/2013  PATIENT:  Evan Leonard  53 y.o. male  PRE-OPERATIVE DIAGNOSIS:  Incarcerated Umbilical Hernia  POST-OPERATIVE DIAGNOSIS:  Incarcerated Umbilical Hernia  PROCEDURE:  Procedure(s): EXPLORATORY LAPAROTOMY Repair ventral/umbilical hernia, without mesh, partial omentectomy  SURGEON:  Surgeon(s): Gwenyth Ober, MD Imogene Burn. Tsuei, MD  ASSISTANT: Tsuei  ANESTHESIA:   general  EBL: 75 ml  BLOOD ADMINISTERED: none  DRAINS: Nasogastric Tube and Urinary Catheter (Foley)   SPECIMEN:  No Specimen  COUNTS CORRECT:  YES  PROCEDURE DETAILS: The patient was taken to the operating room and placed on the table in the supine position. After an adequate general endotracheal anesthetic was administered he was prepped and draped in the usual sterile manner exposing its entire abdomen.  A proper timeout was performed identifying the patient and the procedure to be performed. The area is markedly erythematous abdominal wall with incarcerated umbilical hernia was identified. We made a 12-14 cm long transverse incision infraumbilically across the abdomen. We dissected down into the subcutaneous tissue attaining hemostasis along the way. We were able to isolate the incarcerated umbilical hernia sac underneath the umbilicus and dissected away from the skin of the umbilicus itself.  Once we had the umbilical hernia sac dissected away from the skin, we opened the sac and found there to be incarcerated omentum and a small loop of incarcerated bowel which was nonischemic.  We reduced all the contents of the hernia however we did have to resect part of the omentum which was frankly infarcted and ischemic.  The small bowel did not require resection.  Once the hernia was reduced the edges of the hernia were identified back to the fascia. We repaired using interrupted #1 Novafil sutures followed by back and 4 sutures of running #1 Novafil this provided a 3 layer  suture repair without mesh. We irrigated with saline and packed with a Kerlix soaked in saline x2. All needle counts, sponge counts and instrument counts were correct.  PATIENT DISPOSITION:  ICU after emergency reintubation in the OR.   Gwenyth Ober 7/20/201510:40 PM

## 2013-12-13 NOTE — ED Notes (Signed)
MD at bedside. 

## 2013-12-13 NOTE — ED Provider Notes (Signed)
CSN: [id number]     Arrival date & time 12/13/13  1637 History   First MD Initiated Contact with Patient 12/13/13 1707     Chief Complaint  Patient presents with  . Cellulitis     (Consider location/radiation/quality/duration/timing/severity/associated sxs/prior Treatment) HPI Tyquavious Gamel is a 53 y.o. male that presents to the ED with worsening nausea, vomiting and cellulitis. Patient initially evaluated 3 days ago with CT and found to have umbilical hernia and cellulitis. Patient started on Bactrim for treatment of cellulitis. Over the weekend, his symptoms worsened. Today, he went to urgent care for evaluation and was transferred to the ED for concern of incarcerated umbilical hernia. Patient has decreased appetite, constipation (last bowel movement 4 days ago), has not passed gas recently, has continued nausea and vomiting and some lightheadedness. He also has noticed hematuria. He has not checked for fever at home but feels like he has had a fever. No chest pain. Emesis is non-bloody and non-bilious.  Past Medical History  Diagnosis Date  . Obesity   . Pulmonary embolism 05/04/2012    bilaterally  . Exertional dyspnea 05/04/2012    "isolated episode" (05/05/2012)  . Sleep apnea     "pretty sure; never been tested" (05/05/2012)  . History of chickenpox   . DVT (deep venous thrombosis)   . Diabetes mellitus, type II 02/2013   Past Surgical History  Procedure Laterality Date  . No past surgeries     Family History  Problem Relation Age of Onset  . Diabetes Mother    History  Substance Use Topics  . Smoking status: Never Smoker   . Smokeless tobacco: Never Used     Comment: never used tobacco  . Alcohol Use: No    Review of Systems  Constitutional: Negative for fever (subjective).  Respiratory: Negative for shortness of breath and wheezing.   Cardiovascular: Negative for chest pain.  Gastrointestinal: Positive for nausea, vomiting, abdominal pain and constipation (no  bowel movement in 4 days. No flatus in days). Negative for diarrhea.  Genitourinary: Positive for hematuria.  All other systems reviewed and are negative.     Allergies  Review of patient's allergies indicates no known allergies.  Home Medications   Prior to Admission medications   Medication Sig Start Date End Date Taking? Authorizing Provider  aspirin 81 MG tablet Take 81 mg by mouth 2 (two) times daily.    Historical Provider, MD  ciprofloxacin (CIPRO) 500 MG tablet Take 1 tablet (500 mg total) by mouth every 12 (twelve) hours. 12/02/13   Liam Graham, PA-C  metFORMIN (GLUCOPHAGE) 500 MG tablet Take 500 mg by mouth 2 (two) times daily with a meal.    Historical Provider, MD  Multiple Vitamin (MULTIVITAMIN WITH MINERALS) TABS tablet Take 1 tablet by mouth daily.    Historical Provider, MD  oxyCODONE-acetaminophen (PERCOCET/ROXICET) 5-325 MG per tablet Take 1 tablet by mouth every 6 (six) hours as needed for severe pain. 12/11/13   [place], PA-C  promethazine (PHENERGAN) 25 MG tablet Take 1 tablet (25 mg total) by mouth every 8 (eight) hours as needed for nausea or vomiting. 12/11/13   Resa Miner Lawyer, PA-C  sulfamethoxazole-trimethoprim (SEPTRA DS) 800-160 MG per tablet Take 2 tablets by mouth 2 (two) times daily. 12/11/13   Resa Miner Lawyer, PA-C   BP 117/72  Pulse 94  Temp(Src) 100.6 F (38.1 C) (Rectal)  SpO2 96%  Physical Exam  Vitals reviewed. Constitutional: He is oriented to person, place, and time. He  does not appear ill. No distress.  Eyes: Conjunctivae and EOM are normal.  Cardiovascular: Normal rate and regular rhythm.   Pulmonary/Chest: Effort normal and breath sounds normal. No respiratory distress. He has no wheezes. He has no rales.  Abdominal: Bowel sounds are decreased.  Abdomen distended and generally soft. Area above umbilicus with tenderness  extending down to pelvic area and widespread erythema of lower abdomen and pelvis. Area of firmness  just around umbilicus.  Neurological: He is alert and oriented to person, place, and time.  Skin: Skin is warm and dry. There is erythema (extensive in lower abdomen region extending down to pelvic area).    ED Course  Procedures (including critical care time) Labs Review Labs Reviewed  BASIC METABOLIC PANEL - Abnormal; Notable for the following:    Sodium 136 (*)    Chloride 95 (*)    Glucose, Bld 114 (*)    GFR calc non Af Amer 69 (*)    GFR calc Af Amer 80 (*)    All other components within normal limits  CBC - Abnormal; Notable for the following:    WBC 22.5 (*)    All other components within normal limits  I-STAT CHEM 8, ED - Abnormal; Notable for the following:    Creatinine, Ser 1.40 (*)    Glucose, Bld 122 (*)    All other components within normal limits  DIFFERENTIAL    Imaging Review Dg Abd 2 Views  12/13/2013   CLINICAL DATA:  Abdominal pain.  No bowel movement for 2 weeks.  EXAM: ABDOMEN - 2 VIEW  COMPARISON:  Abdominal pelvic CT 12/10/2013.  FINDINGS: There is interval development of moderate diffuse small bowel distension with associated air-fluid levels on the erect examination. The colon appears relatively decompressed. There is no evidence of free intraperitoneal air. Calculi in the lower pole of the right kidney and in the bladder are again noted. There are stable degenerative changes of the spine.  IMPRESSION: 1. Interval development of small bowel distension with air-fluid levels concerning for small bowel obstruction, possibly from the umbilical hernia containing small bowel on previous CT. 2. Grossly stable right renal and bladder calculi.   Electronically Signed   By: Camie Patience M.D.   On: 12/13/2013 18:53     EKG Interpretation None      MDM   Final diagnoses:  None  Assessment Small bowel obstruction Incarcerated hernia Leukocytosis Cellulitis Acute kidney injury  X-ray confirms bowel obstruction. With previous imaging showing umbilical  hernia, concern for incarcerated bowel. Dr. Georgette Dover, General Surgery, consulted and evaluated patient. WBC elevated to 22.5. Will prepare patient for emergent exploratory laparotomy tonight. Patient initially given clindamycin IV for cellulitis. Subsequently given Zosyn for pre-op. Patient not in significant pain and not requiring analgesics. Vitals stable while in ED. Dr. Hulen Skains to perform surgery.  Cordelia Poche, MD 12/13/13 2051

## 2013-12-13 NOTE — Consult Note (Signed)
PULMONARY / CRITICAL CARE MEDICINE   Name: Evan Leonard MRN: [id number] DOB: 19-Nov-1960    ADMISSION DATE:  12/13/2013 CONSULTATION DATE:  12/13/2013  REFERRING MD :  Hulen Skains  CHIEF COMPLAINT:  Incarcerated umbilical hernia  INITIAL PRESENTATION:  53 yo male with hx of umbilical hernia presented with abdominal pain.  Taken to OR for incarcerated umbilical hernia.  Remained on vent post-op.  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 Annamaria Boots.  STUDIES:  5/02 CT abd > Umbilical hernia with small bowel, no signs of obstruction. Increased attenuation of R kidney. 49mm non-obstructing stone in R kidney. 91mm stone in bladder.   SIGNIFICANT EVENTS: 7/20 emergent surgery for incarcerated umbilical hernia.   HISTORY OF PRESENT ILLNESS:  53 year old male with PMH as below, which includes DM, PE, DVT. He presented 7/20 with a long-standing umbilical hernia but recent tenderness. He developed redness in this area and was evaluated in the ED on 12/10/13. Ct showed no sign of obstruction, discharged. He returned 7/20 with worsening abdominal pain, nausea, and subjective fever. The erythema has worsened significantly and the firmness and tenderness around his umbilicus has worsened significantly.  Plain films show signs of obstruction. He was taken emergently to OR under Dr. Hulen Skains. Required re-intubation in PACU and remains on vent post-op.    PAST MEDICAL HISTORY :  Past Medical History  Diagnosis Date  . Obesity   . Pulmonary embolism 05/04/2012    bilaterally  . Exertional dyspnea 05/04/2012    "isolated episode" (05/05/2012)  . Sleep apnea     "pretty sure; never been tested" (05/05/2012)  . History of chickenpox   . DVT (deep venous thrombosis)   . Diabetes mellitus, type II 02/2013   Past Surgical History  Procedure Laterality Date  . No past surgeries     Prior to Admission medications   Medication Sig Start Date End Date Taking? Authorizing Provider   aspirin EC 81 MG tablet Take 81 mg by mouth 2 (two) times daily.   Yes Historical Provider, MD  metFORMIN (GLUCOPHAGE) 500 MG tablet Take 500 mg by mouth 2 (two) times daily with a meal.   Yes Historical Provider, MD  Multiple Vitamin (MULTIVITAMIN WITH MINERALS) TABS tablet Take 1 tablet by mouth daily.   Yes Historical Provider, MD  oxyCODONE-acetaminophen (PERCOCET/ROXICET) 5-325 MG per tablet Take 1 tablet by mouth every 6 (six) hours as needed for severe pain.   Yes Historical Provider, MD  promethazine (PHENERGAN) 25 MG tablet Take 25 mg by mouth every 6 (six) hours as needed for nausea or vomiting.   Yes Historical Provider, MD  sulfamethoxazole-trimethoprim (BACTRIM DS) 800-160 MG per tablet Take 2 tablets by mouth 2 (two) times daily.   Yes Historical Provider, MD   No Known Allergies  FAMILY HISTORY:  Family History  Problem Relation Age of Onset  . Diabetes Mother    SOCIAL HISTORY:  reports that he has never smoked. He has never used smokeless tobacco. He reports that he does not drink alcohol or use illicit drugs.  REVIEW OF SYSTEMS:  Unable - intubated  SUBJECTIVE:   VITAL SIGNS: Temp:  [98.6 F (37 C)-100.6 F (38.1 C)] 100.5 F (38.1 C) (07/21 0346) Pulse Rate:  [93-117] 101 (07/21 0700) Resp:  [15-23] 17 (07/21 0700) BP: (116-168)/(52-112) 121/71 mmHg (07/21 0700) SpO2:  [93 %-100 %] 99 % (07/21 0700) FiO2 (%):  [80 %] 80 % (07/21 0600) Weight:  [386 lb (  175.088 kg)-389 lb (176.449 kg)] 389 lb (176.449 kg) (07/21 0600) HEMODYNAMICS:   VENTILATOR SETTINGS: Vent Mode:  [-] PRVC FiO2 (%):  [80 %] 80 % Set Rate:  [14 bmp] 14 bmp Vt Set:  [650 mL] 650 mL PEEP:  [5 cmH20] 5 cmH20 Plateau Pressure:  [24 cmH20] 24 cmH20 INTAKE / OUTPUT: Intake/Output     07/20 0701 - 07/21 0700 07/21 0701 - 07/22 0700   I.V. (mL/kg) 4484.2 (25.4)    NG/GT 30    Total Intake(mL/kg) 4514.2 (25.6)    Urine (mL/kg/hr) 1520    Emesis/NG output 300    Other 500    Blood 100     Total Output 2420     Net +2094.2            PHYSICAL EXAMINATION: General:  Morbidly obese male (369 lbs) on vent in NAD  Neuro:  RASS -1, sedated on vent HEENT:  [place]/AT, PERRL, no JVD noted Cardiovascular:  Tachy, regular Lungs:  Clear anteriorly Abdomen:  Obese, abd binder in place. Hypoactive BS Musculoskeletal:  No acute deformity Skin:  Surgical incision to abdomen, open/packed, under abd binder.   LABS:  CBC  Recent Labs Lab 12/10/13 1917 12/13/13 1702 12/13/13 1726 12/14/13 0349  WBC 17.2* 22.5*  --  19.6*  HGB 15.4 14.7 16.3 15.1  HCT 45.2 43.5 48.0 45.5  PLT 246 253  --  237   Coag's  Recent Labs Lab 12/14/13 0349  INR 1.17   BMET  Recent Labs Lab 12/10/13 1917 12/13/13 1702 12/13/13 1726 12/14/13 0349  NA 138 136* 137 135*  K 4.2 4.6 4.5 5.6*  CL 99 95* 98 98  CO2 25 26  --  20  BUN 12 16 17 15   CREATININE 0.90 1.17 1.40* 0.95  GLUCOSE 149* 114* 122* 130*   Electrolytes  Recent Labs Lab 12/10/13 1917 12/13/13 1702 12/14/13 0349  CALCIUM 9.1 8.9 8.3*   ABG  Recent Labs Lab 12/14/13 0114  PHART 7.340*  PCO2ART 41.6  PO2ART 63.5*   Liver Enzymes  Recent Labs Lab 12/10/13 1917  AST 23  ALT 32  ALKPHOS 66  BILITOT 0.7  ALBUMIN 3.8   Glucose  Recent Labs Lab 12/14/13 0039 12/14/13 0328  GLUCAP 140* 145*    Imaging Dg Abd 2 Views  12/13/2013   CLINICAL DATA:  Abdominal pain.  No bowel movement for 2 weeks.  EXAM: ABDOMEN - 2 VIEW  COMPARISON:  Abdominal pelvic CT 12/10/2013.  FINDINGS: There is interval development of moderate diffuse small bowel distension with associated air-fluid levels on the erect examination. The colon appears relatively decompressed. There is no evidence of free intraperitoneal air. Calculi in the lower pole of the right kidney and in the bladder are again noted. There are stable degenerative changes of the spine.  IMPRESSION: 1. Interval development of small bowel distension with air-fluid levels  concerning for small bowel obstruction, possibly from the umbilical hernia containing small bowel on previous CT. 2. Grossly stable right renal and bladder calculi.   Electronically Signed   By: Camie Patience M.D.   On: 12/13/2013 18:53     ASSESSMENT / PLAN:  PULMONARY [place] 7/20 > A: Acute respiratory failure in post operative setting. H/o OSA with difficulty to tolerate CPAP as outpt. P:   Pressure support wean as tolerated F/u CXR Will need BiPAP after extubation  CARDIOVASCULAR A:  Hypertension. Hx of DVT, PE. P:  Continuous cardiac/NIMP monitoring Hydralazine PRN to keep SBP under  168mm/Hg Resume ASA when okay with surgery  RENAL A:   Renal calculi, incidental finding on recent CT P:   Monitor Follow BMP  GASTROINTESTINAL A:   S/p surgical repair of incarcerated umbilical hernia P:   Management per CCS Protonix for ppx  HEMATOLOGIC A:   VTE ppx P:  Lovenox  Follow CBC  INFECTIOUS A:   Cellulitis P:   Abx: Zosyn, start date 7/21, day 1 of ?  ENDOCRINE A:  DM P:   CBG monitoring and SSI while intubated Lantus 15units Hold metformin  NEUROLOGIC A:  Acute encephalopathy in post op setting and 2nd to medical sedation.  P:   RASS goal: - 1 PAD 2  TODAY'S SUMMARY: Remains on vent s/p emergent surgery for incarcerated umbilical hernia. Some HTN and tachycardia. Likely due to undersedation and pain at this time. Will start Fentanyl gtt and PRN hydralazine if needed. Likely extubate in am. CCS primary.   Georgann Housekeeper, ACNP [place] [facility]/Critical [facility] Pager [phone] or [phone]  Reviewed above, and examined.  He remained on vent post-op.  He is tolerating SBT this AM.  Will proceed with extubation trial.  He will need BiPAP and bronchial hygiene after extubation.  CC time 35 minutes.  Chesley Mires, MD [facility] Pulmonary/Critical Care 12/14/2013, 7:16 AM Pager:  [phone] After 3pm call: [phone]

## 2013-12-14 ENCOUNTER — Inpatient Hospital Stay (HOSPITAL_COMMUNITY): Payer: PRIVATE HEALTH INSURANCE

## 2013-12-14 ENCOUNTER — Encounter (HOSPITAL_COMMUNITY): Payer: Self-pay | Admitting: General Surgery

## 2013-12-14 DIAGNOSIS — Z6841 Body Mass Index (BMI) 40.0 and over, adult: Secondary | ICD-10-CM

## 2013-12-14 DIAGNOSIS — G4733 Obstructive sleep apnea (adult) (pediatric): Secondary | ICD-10-CM

## 2013-12-14 DIAGNOSIS — E119 Type 2 diabetes mellitus without complications: Secondary | ICD-10-CM

## 2013-12-14 DIAGNOSIS — E875 Hyperkalemia: Secondary | ICD-10-CM

## 2013-12-14 LAB — BASIC METABOLIC PANEL
Anion gap: 17 — ABNORMAL HIGH (ref 5–15)
BUN: 15 mg/dL (ref 6–23)
CO2: 20 mEq/L (ref 19–32)
Calcium: 8.3 mg/dL — ABNORMAL LOW (ref 8.4–10.5)
Chloride: 98 mEq/L (ref 96–112)
Creatinine, Ser: 0.95 mg/dL (ref 0.50–1.35)
GFR calc Af Amer: >90 mL/min (ref >90)
GFR calc non Af Amer: >90 mL/min (ref >90)
Glucose, Bld: 130 mg/dL — ABNORMAL HIGH (ref 70–99)
Potassium: 5.6 mEq/L — ABNORMAL HIGH (ref 3.7–5.3)
Sodium: 135 mEq/L — ABNORMAL LOW (ref 137–147)

## 2013-12-14 LAB — GLUCOSE, CAPILLARY
Glucose-Capillary: 119 mg/dL — ABNORMAL HIGH (ref 70–99)
Glucose-Capillary: 136 mg/dL — ABNORMAL HIGH (ref 70–99)
Glucose-Capillary: 140 mg/dL — ABNORMAL HIGH (ref 70–99)
Glucose-Capillary: 145 mg/dL — ABNORMAL HIGH (ref 70–99)
Glucose-Capillary: 147 mg/dL — ABNORMAL HIGH (ref 70–99)
Glucose-Capillary: 152 mg/dL — ABNORMAL HIGH (ref 70–99)
Glucose-Capillary: 166 mg/dL — ABNORMAL HIGH (ref 70–99)

## 2013-12-14 LAB — CBC
HCT: 45.5 % (ref 39.0–52.0)
Hemoglobin: 15.1 g/dL (ref 13.0–17.0)
MCH: 29.8 pg (ref 26.0–34.0)
MCHC: 33.2 g/dL (ref 30.0–36.0)
MCV: 89.9 fL (ref 78.0–100.0)
Platelets: 237 10*3/uL (ref 150–400)
RBC: 5.06 MIL/uL (ref 4.22–5.81)
RDW: 14.3 % (ref 11.5–15.5)
WBC: 19.6 10*3/uL — ABNORMAL HIGH (ref 4.0–10.5)

## 2013-12-14 LAB — HEMOGLOBIN A1C
Hgb A1c MFr Bld: 6.8 % — ABNORMAL HIGH (ref <5.7)
Mean Plasma Glucose: 148 mg/dL — ABNORMAL HIGH (ref <117)

## 2013-12-14 LAB — BLOOD GAS, ARTERIAL
Acid-base deficit: 3 mmol/L — ABNORMAL HIGH (ref 0.0–2.0)
Bicarbonate: 21.8 mEq/L (ref 20.0–24.0)
Drawn by: 41875
FIO2: 0.8 %
MECHVT: 650 mL
O2 Saturation: 90.5 %
PEEP: 5 cmH2O
Patient temperature: 98.6
RATE: 12 resp/min
TCO2: 23.1 mmol/L (ref 0–100)
pCO2 arterial: 41.6 mmHg (ref 35.0–45.0)
pH, Arterial: 7.34 — ABNORMAL LOW (ref 7.350–7.450)
pO2, Arterial: 63.5 mmHg — ABNORMAL LOW (ref 80.0–100.0)

## 2013-12-14 LAB — PROTIME-INR
INR: 1.17 (ref 0.00–1.49)
Prothrombin Time: 14.9 seconds (ref 11.6–15.2)

## 2013-12-14 MED ORDER — CHLORHEXIDINE GLUCONATE 0.12 % MT SOLN
15.0000 mL | Freq: Two times a day (BID) | OROMUCOSAL | Status: DC
  Administered 2013-12-14 – 2013-12-20 (×11): 15 mL via OROMUCOSAL
  Filled 2013-12-14 (×11): qty 15

## 2013-12-14 MED ORDER — DEXTROSE-NACL 5-0.45 % IV SOLN
INTRAVENOUS | Status: DC
  Administered 2013-12-14 – 2013-12-15 (×2): via INTRAVENOUS
  Filled 2013-12-14 (×5): qty 1000

## 2013-12-14 MED ORDER — PANTOPRAZOLE SODIUM 40 MG IV SOLR
40.0000 mg | Freq: Every day | INTRAVENOUS | Status: DC
  Administered 2013-12-14 – 2013-12-21 (×9): 40 mg via INTRAVENOUS
  Filled 2013-12-14 (×14): qty 40

## 2013-12-14 MED ORDER — FENTANYL CITRATE 0.05 MG/ML IJ SOLN
50.0000 ug | INTRAMUSCULAR | Status: DC | PRN
  Administered 2013-12-14 – 2013-12-16 (×16): 50 ug via INTRAVENOUS
  Filled 2013-12-14 (×15): qty 2

## 2013-12-14 MED ORDER — BIOTENE DRY MOUTH MT LIQD
15.0000 mL | Freq: Two times a day (BID) | OROMUCOSAL | Status: DC
  Administered 2013-12-14 – 2013-12-19 (×12): 15 mL via OROMUCOSAL

## 2013-12-14 MED ORDER — SODIUM CHLORIDE 0.9 % IV SOLN: 250.0000 mL | INTRAVENOUS | Status: DC | PRN

## 2013-12-14 MED ORDER — FENTANYL CITRATE 0.05 MG/ML IJ SOLN
INTRAMUSCULAR | Status: AC
  Administered 2013-12-14: 50 ug via INTRAVENOUS
  Filled 2013-12-14: qty 2

## 2013-12-14 MED ORDER — FENTANYL BOLUS VIA INFUSION
50.0000 ug | INTRAVENOUS | Status: DC | PRN
  Filled 2013-12-14: qty 100

## 2013-12-14 NOTE — Progress Notes (Signed)
Paged on-call Surgical Physician to check if patient abdomen is open and if so, does he need to go back to OR for closure. Patient has been tolerating CPAP for about and hour and a half. If cleared by surgical, CCM is on board for extubation. Patient is aware of current situation and is cooperative with care. Will continue to monitor. Richardean Sale, RN

## 2013-12-14 NOTE — Progress Notes (Signed)
PULMONARY / CRITICAL CARE MEDICINE   Name: Evan Leonard MRN: [id number] DOB: 1960-07-25    ADMISSION DATE:  12/13/2013 CONSULTATION DATE:  12/13/2013  REFERRING MD :  Hulen Skains  CHIEF COMPLAINT:  Incarcerated umbilical hernia  INITIAL PRESENTATION:  53 yo male with hx of umbilical hernia presented with abdominal pain.  Taken to OR for incarcerated umbilical hernia.  Remained on vent post-op.  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 Annamaria Boots.  STUDIES:  9/89 CT abd > Umbilical hernia with small bowel, no signs of obstruction. Increased attenuation of R kidney. 70mm non-obstructing stone in R kidney. 31mm stone in bladder.   SIGNIFICANT EVENTS: 7/20 emergent surgery for incarcerated umbilical hernia.   HISTORY OF PRESENT ILLNESS:  53 year old male with PMH as below, which includes DM, PE, DVT. He presented 7/20 with a long-standing umbilical hernia but recent tenderness. He developed redness in this area and was evaluated in the ED on 12/10/13. Ct showed no sign of obstruction, discharged. He returned 7/20 with worsening abdominal pain, nausea, and subjective fever. The erythema has worsened significantly and the firmness and tenderness around his umbilicus has worsened significantly.  Plain films show signs of obstruction. He was taken emergently to OR under Dr. Hulen Skains. Required re-intubation in PACU and remains on vent post-op.   SUBJECTIVE: Awake and interactive, responding to questions by nodding.  VITAL SIGNS: Temp:  [98.6 F (37 C)-100.6 F (38.1 C)] 99.5 F (37.5 C) (07/21 0814) Pulse Rate:  [93-117] 109 (07/21 0715) Resp:  [15-23] 20 (07/21 0715) BP: (116-168)/(52-112) 121/71 mmHg (07/21 0715) SpO2:  [93 %-100 %] 100 % (07/21 0715) FiO2 (%):  [40 %-80 %] 40 % (07/21 0715) Weight:  [386 lb (175.088 kg)-389 lb (176.449 kg)] 389 lb (176.449 kg) (07/21 0600) HEMODYNAMICS:   VENTILATOR SETTINGS: Vent Mode:  [-] PSV;CPAP FiO2 (%):  [40 %-80 %] 40  % Set Rate:  [14 bmp] 14 bmp Vt Set:  [650 mL] 650 mL PEEP:  [5 cmH20] 5 cmH20 Pressure Support:  [5 cmH20] 5 cmH20 Plateau Pressure:  [24 cmH20] 24 cmH20 INTAKE / OUTPUT: Intake/Output     07/20 0701 - 07/21 0700 07/21 0701 - 07/22 0700   I.V. (mL/kg) 4484.2 (25.4)    NG/GT 30    Total Intake(mL/kg) 4514.2 (25.6)    Urine (mL/kg/hr) 1520 150 (0.6)   Emesis/NG output 300    Other 500    Blood 100    Total Output 2420 150   Net +2094.2 -150          PHYSICAL EXAMINATION: General:  Morbidly obese male (369 lbs) on vent in NAD  Neuro:  Awake and interactive, following commands, on vent HEENT:  [place]/AT, PERRL, no JVD noted Cardiovascular:  Tachy, regular Lungs:  Clear anteriorly Abdomen:  Obese, abd binder in place. Hypoactive BS Musculoskeletal:  No acute deformity Skin:  Surgical incision to abdomen, open/packed, under abd binder.   LABS:  CBC  Recent Labs Lab 12/10/13 1917 12/13/13 1702 12/13/13 1726 12/14/13 0349  WBC 17.2* 22.5*  --  19.6*  HGB 15.4 14.7 16.3 15.1  HCT 45.2 43.5 48.0 45.5  PLT 246 253  --  237   Coag's  Recent Labs Lab 12/14/13 0349  INR 1.17   BMET  Recent Labs Lab 12/10/13 1917 12/13/13 1702 12/13/13 1726 12/14/13 0349  NA 138 136* 137 135*  K 4.2 4.6 4.5 5.6*  CL 99 95* 98 98  CO2  25 26  --  20  BUN 12 16 17 15   CREATININE 0.90 1.17 1.40* 0.95  GLUCOSE 149* 114* 122* 130*   Electrolytes  Recent Labs Lab 12/10/13 1917 12/13/13 1702 12/14/13 0349  CALCIUM 9.1 8.9 8.3*   ABG  Recent Labs Lab 12/14/13 0114  PHART 7.340*  PCO2ART 41.6  PO2ART 63.5*   Liver Enzymes  Recent Labs Lab 12/10/13 1917  AST 23  ALT 32  ALKPHOS 66  BILITOT 0.7  ALBUMIN 3.8   Glucose  Recent Labs Lab 12/14/13 0039 12/14/13 0328  GLUCAP 140* 145*    Imaging Dg Abd 2 Views  12/13/2013   CLINICAL DATA:  Abdominal pain.  No bowel movement for 2 weeks.  EXAM: ABDOMEN - 2 VIEW  COMPARISON:  Abdominal pelvic CT 12/10/2013.   FINDINGS: There is interval development of moderate diffuse small bowel distension with associated air-fluid levels on the erect examination. The colon appears relatively decompressed. There is no evidence of free intraperitoneal air. Calculi in the lower pole of the right kidney and in the bladder are again noted. There are stable degenerative changes of the spine.  IMPRESSION: 1. Interval development of small bowel distension with air-fluid levels concerning for small bowel obstruction, possibly from the umbilical hernia containing small bowel on previous CT. 2. Grossly stable right renal and bladder calculi.   Electronically Signed   By: Camie Patience M.D.   On: 12/13/2013 18:53     ASSESSMENT / PLAN:  PULMONARY Oett 7/20 > A: Acute respiratory failure in post operative setting. H/o OSA with difficulty to tolerate CPAP as outpt. P:   - Pressure support wean as tolerated - F/u CXR - Will need BiPAP after extubation - If not going back to the OR will extubate, awaiting surgical input.  CARDIOVASCULAR A:  Hypertension. Hx of DVT, PE. P:  - Continuous cardiac/NIMP monitoring - Hydralazine PRN to keep SBP under 156mm/Hg - Resume ASA when okay with surgery  RENAL A:   Renal calculi, incidental finding on recent CT Hyperkalemia today on fluid with K in it. P:   - D/C IVF with K, will not treat hyperkalemia since iatrogenic and asymptomatic. - Follow BMP - Replace electrolytes as indicated.  GASTROINTESTINAL A:   S/p surgical repair of incarcerated umbilical hernia P:   - Management per CCS - Protonix for ppx - ?back to OR today  HEMATOLOGIC A:   VTE ppx P:  - Lovenox  - Follow CBC  INFECTIOUS A:   Cellulitis P:   - Abx: Zosyn, start date 7/21, day 1 of ?, will continue for now.  ENDOCRINE A:  DM P:   - CBG monitoring and SSI while intubated - Lantus 15units - Hold metformin while NPO  NEUROLOGIC A:  Acute encephalopathy in post op setting and 2nd to  medical sedation.  P:   - RASS goal: - 1 PAD 2  TODAY'S SUMMARY: Ready for extubation from pulmonary dynamics standpoint but will hold until communicate with CCS to see if patient is going back to OR today, D/C IVF and if will remain intubated then will consider restarting without K..  CC time 35 minutes.  Rush Farmer, M.D. [facility] Pulmonary/Critical Care Medicine. Pager: [phone]. After hours pager: [phone].

## 2013-12-14 NOTE — Progress Notes (Signed)
RT checked the pressure of the cuff and it was 110. RT attempted to bring the pressure down to a normal range and pt did not handle situation well. Pt became tachycardic, tachypnea, peak pressures went into the 40's and pt became anxious.

## 2013-12-14 NOTE — Progress Notes (Signed)
I have seen and examined the pt and agree with PA-Osborne's progress note. Extubated this AM Abd soreness Await bowel function abx

## 2013-12-14 NOTE — ED Provider Notes (Signed)
Medical screening examination/treatment/procedure(s) were performed by resident physician or non-physician practitioner and as supervising physician I was immediately available for consultation/collaboration.   Pauline Good MD.   Billy Fischer, MD 12/14/13 1125

## 2013-12-14 NOTE — Anesthesia Postprocedure Evaluation (Signed)
  Anesthesia Post-op Note  [name]  Procedure(s) Performed: Procedure(s): EXPLORATORY LAPAROTOMY Repair ventral hernia, without mesh, partial omentectomy (N/A)  Patient Location: PACU and ICU  Anesthesia Type:General  Level of Consciousness: Patient remains intubated per anesthesia plan  Airway and Oxygen Therapy: Patient remains intubated per anesthesia plan  Post-op Pain: mild  Post-op Assessment: Post-op Vital signs reviewed  Post-op Vital Signs: Reviewed  Last Vitals:  Filed Vitals:   12/13/13 2349  BP:   Pulse: 109  Temp:   Resp: 15    Complications: Patient re-intubated

## 2013-12-14 NOTE — Progress Notes (Addendum)
Blood gas from 12/14/2013 at 0015 was done with patient on a rate of 14, tidal volume of 650, oxygen at 80% and peep of 5. RT called MD and was told to leave pt at these settings.

## 2013-12-14 NOTE — Progress Notes (Signed)
Patient ID: Evan Leonard, male   DOB: June 09, 1960, 53 y.o.   MRN: [id number] 1 Day Post-Op  Subjective: Patient intubated but awake on the ventilator. He states that his pain is well controlled. He has no bowel function yet.  Objective: Vital signs in last 24 hours: Temp:  [98.6 F (37 C)-100.6 F (38.1 C)] 99.5 F (37.5 C) (07/21 0814) Pulse Rate:  [93-117] 103 (07/21 0900) Resp:  [15-23] 19 (07/21 0800) BP: (111-168)/(52-112) 111/70 mmHg (07/21 0900) SpO2:  [93 %-100 %] 98 % (07/21 0800) FiO2 (%):  [40 %-80 %] 40 % (07/21 0715) Weight:  [386 lb (175.088 kg)-389 lb (176.449 kg)] 389 lb (176.449 kg) (07/21 0600)    Intake/Output from previous day: 07/20 0701 - 07/21 0700 In: 4514.2 [I.V.:4484.2; NG/GT:30] Out: 2420 [Urine:1520; Emesis/NG output:300; Blood:100] Intake/Output this shift: Total I/O In: 278.8 [I.V.:178.8; IV Piggyback:100] Out: 375 [Urine:275; Emesis/NG output:100]  PE: Abd: Morbidly obese, abdominal binder removed and packing removed from wound. His wound is clean. Packing was replaced. No active bowel sounds. NG tube was some bilious output.  Lab Results:   Recent Labs  12/13/13 1702 12/13/13 1726 12/14/13 0349  WBC 22.5*  --  19.6*  HGB 14.7 16.3 15.1  HCT 43.5 48.0 45.5  PLT 253  --  237   BMET  Recent Labs  12/13/13 1702 12/13/13 1726 12/14/13 0349  NA 136* 137 135*  K 4.6 4.5 5.6*  CL 95* 98 98  CO2 26  --  20  GLUCOSE 114* 122* 130*  BUN 16 17 15   CREATININE 1.17 1.40* 0.95  CALCIUM 8.9  --  8.3*   PT/INR  Recent Labs  12/14/13 0349  LABPROT 14.9  INR 1.17   CMP     Component Value Date/Time   NA 135* 12/14/2013 0349   K 5.6* 12/14/2013 0349   CL 98 12/14/2013 0349   CO2 20 12/14/2013 0349   GLUCOSE 130* 12/14/2013 0349   BUN 15 12/14/2013 0349   CREATININE 0.95 12/14/2013 0349   CALCIUM 8.3* 12/14/2013 0349   PROT 8.2 12/10/2013 1917   ALBUMIN 3.8 12/10/2013 1917   AST 23 12/10/2013 1917   ALT 32 12/10/2013 1917   ALKPHOS 66  12/10/2013 1917   BILITOT 0.7 12/10/2013 1917   GFRNONAA >90 12/14/2013 0349   GFRAA >90 12/14/2013 0349   Lipase     Component Value Date/Time   LIPASE 20 12/10/2013 1917       Studies/Results: Dg Chest Port 1 View  12/14/2013   CLINICAL DATA:  Endotracheal tube placement. Postoperative radiograph.  EXAM: PORTABLE CHEST - 1 VIEW  COMPARISON:  Chest radiograph performed 03/22/2013, and CTA of the chest performed 03/23/2013  FINDINGS: The patient's endotracheal tube is seen ending just above the carina. This should be retracted 2-3 cm. An enteric tube is noted extending below the diaphragm.  The lungs are significantly hypoexpanded, with mild bilateral atelectasis. No pleural effusion or pneumothorax is seen.  The cardiomediastinal silhouette is mildly enlarged. No acute osseous abnormalities are seen.  IMPRESSION: 1. Endotracheal tube seen ending just above the carina. This should be retracted 2-3 cm. 2. Lung significantly hypoexpanded, with mild bilateral atelectasis. Mild cardiomegaly noted.  These results were called by telephone at the time of interpretation on 12/14/2013 at 1:34 am to Dr. Denny Peon, who verbally acknowledged these results.   Electronically Signed   By: Roanna Raider M.D.   On: 12/14/2013 01:36   Dg Abd 2 Views  12/13/2013   CLINICAL  DATA:  Abdominal pain.  No bowel movement for 2 weeks.  EXAM: ABDOMEN - 2 VIEW  COMPARISON:  Abdominal pelvic CT 12/10/2013.  FINDINGS: There is interval development of moderate diffuse small bowel distension with associated air-fluid levels on the erect examination. The colon appears relatively decompressed. There is no evidence of free intraperitoneal air. Calculi in the lower pole of the right kidney and in the bladder are again noted. There are stable degenerative changes of the spine.  IMPRESSION: 1. Interval development of small bowel distension with air-fluid levels concerning for small bowel obstruction, possibly from the umbilical hernia  containing small bowel on previous CT. 2. Grossly stable right renal and bladder calculi.   Electronically Signed   By: Roxy Horseman M.D.   On: 12/13/2013 18:53    Anti-infectives: Anti-infectives   Start     Dose/Rate Route Frequency Ordered Stop   12/14/13 0000  piperacillin-tazobactam (ZOSYN) IVPB 3.375 g     3.375 g 12.5 mL/hr over 240 Minutes Intravenous Every 8 hours 12/13/13 2352     12/13/13 1915  [MAR Hold]  piperacillin-tazobactam (ZOSYN) IVPB 3.375 g  Status:  Discontinued     (On MAR Hold since 12/13/13 2049)   3.375 g 100 mL/hr over 30 Minutes Intravenous 4 times per day 12/13/13 1913 12/13/13 2205   12/13/13 1745  clindamycin (CLEOCIN) IVPB 600 mg     600 mg 100 mL/hr over 30 Minutes Intravenous  Once 12/13/13 1738 12/13/13 1925       Assessment/Plan  1. POD 1, status post exploratory laparotomy with primary repair of ventral/umbilical hernia, without mesh, partial omentectomy 2. Ventilator dependent respiratory failure 3. Morbid obesity 4. History of bilateral pulmonary emboli 5. Obstructive sleep apnea 6. History of DVT 7. Diabetes mellitus 8. Hyperkalemia 9. Leukocytosis  Plan: 1. Appreciate CCM assistants. The patient's abdomen is closed, his skin is all that is open. He does not need any further return to the operating room at this time. He may be able to be weaned and extubated as per CCM. 2. His NG tube should remain in place. Await bowel function prior to its removal. 3. When he is able he will need to be mobilized. 4. When extubated, he will need aggressive pulmonary toileting. 5. CABGs are stable. Continue sliding-scale insulin for diabetes while n.p.o. 6. Potassium taken out of the patient's fluids to help with a slightly elevated potassium level. We will repeat his labs in the morning. 7. Continue antibiotic therapy for elevated white blood cell count. D2, Zosyn 8. We'll keep in ICU until able to be extubated and stable for transfer.  LOS: 1 day     Westyn Driggers E 12/14/2013, 9:33 AM Pager: [phone]

## 2013-12-14 NOTE — Procedures (Signed)
Extubation Procedure Note  Patient Details:   Name: Evan Leonard DOB: 1960/11/16 MRN: [id number]   Airway Documentation:     Evaluation  O2 sats: stable throughout Complications: No apparent complications Patient did tolerate procedure well. Bilateral Breath Sounds: Diminished;Rhonchi Suctioning: Airway Yes Patient tolerated wean. MD ordered to extubate. Positive for cuff leak. Patient extubated to a 4 Lpm nasal cannula. No signs of dyspnea or stridor noted. Patient instructed on the Incentive Spirometer, achieving 154mL 5 times, with great patient effort. Patient resting comfortably. RN at bedside.  Myrtie Neither 12/14/2013, 9:52 AM

## 2013-12-14 NOTE — Progress Notes (Signed)
Patient refused CPAP at this time, RT told patient to call if decided to wear his CPAP later in the evening. RT will continue to monitor.

## 2013-12-14 NOTE — ED Provider Notes (Signed)
I saw and evaluated the patient, reviewed the resident's note and I agree with the findings and plan.   .Face to face Exam:  General:  Awake HEENT:  Atraumatic Resp:  Normal effort Abd:  Nondistended  Celluslitis and tenderness as noted Neuro:No focal weakness     Dot Lanes, MD 12/14/13 1538

## 2013-12-15 ENCOUNTER — Inpatient Hospital Stay (HOSPITAL_COMMUNITY): Payer: PRIVATE HEALTH INSURANCE

## 2013-12-15 DIAGNOSIS — E871 Hypo-osmolality and hyponatremia: Secondary | ICD-10-CM

## 2013-12-15 LAB — GLUCOSE, CAPILLARY
Glucose-Capillary: 126 mg/dL — ABNORMAL HIGH (ref 70–99)
Glucose-Capillary: 131 mg/dL — ABNORMAL HIGH (ref 70–99)
Glucose-Capillary: 143 mg/dL — ABNORMAL HIGH (ref 70–99)
Glucose-Capillary: 153 mg/dL — ABNORMAL HIGH (ref 70–99)
Glucose-Capillary: 156 mg/dL — ABNORMAL HIGH (ref 70–99)
Glucose-Capillary: 98 mg/dL (ref 70–99)

## 2013-12-15 LAB — CBC
HCT: 39.1 % (ref 39.0–52.0)
Hemoglobin: 13.2 g/dL (ref 13.0–17.0)
MCH: 29.8 pg (ref 26.0–34.0)
MCHC: 33.8 g/dL (ref 30.0–36.0)
MCV: 88.3 fL (ref 78.0–100.0)
Platelets: 177 10*3/uL (ref 150–400)
RBC: 4.43 MIL/uL (ref 4.22–5.81)
RDW: 14.3 % (ref 11.5–15.5)
WBC: 18.4 10*3/uL — ABNORMAL HIGH (ref 4.0–10.5)

## 2013-12-15 LAB — BASIC METABOLIC PANEL
Anion gap: 15 (ref 5–15)
BUN: 9 mg/dL (ref 6–23)
CO2: 22 mEq/L (ref 19–32)
Calcium: 7.7 mg/dL — ABNORMAL LOW (ref 8.4–10.5)
Chloride: 97 mEq/L (ref 96–112)
Creatinine, Ser: 0.76 mg/dL (ref 0.50–1.35)
GFR calc Af Amer: >90 mL/min (ref >90)
GFR calc non Af Amer: >90 mL/min (ref >90)
Glucose, Bld: 130 mg/dL — ABNORMAL HIGH (ref 70–99)
Potassium: 4.4 mEq/L (ref 3.7–5.3)
Sodium: 134 mEq/L — ABNORMAL LOW (ref 137–147)

## 2013-12-15 LAB — BLOOD GAS, ARTERIAL
Acid-Base Excess: 0.4 mmol/L (ref 0.0–2.0)
Bicarbonate: 24.1 mEq/L — ABNORMAL HIGH (ref 20.0–24.0)
Drawn by: 347621
O2 Content: 2.5 L/min
O2 Saturation: 95.9 %
Patient temperature: 98.6
TCO2: 25.2 mmol/L (ref 0–100)
pCO2 arterial: 36.2 mmHg (ref 35.0–45.0)
pH, Arterial: 7.438 (ref 7.350–7.450)
pO2, Arterial: 76.5 mmHg — ABNORMAL LOW (ref 80.0–100.0)

## 2013-12-15 LAB — MAGNESIUM: Magnesium: 1.9 mg/dL (ref 1.5–2.5)

## 2013-12-15 LAB — PHOSPHORUS: Phosphorus: 1.6 mg/dL — ABNORMAL LOW (ref 2.3–4.6)

## 2013-12-15 MED ORDER — PHENOL 1.4 % MT LIQD
2.0000 | OROMUCOSAL | Status: DC | PRN
  Administered 2013-12-15 (×2): 2 via OROMUCOSAL
  Filled 2013-12-15: qty 177

## 2013-12-15 MED ORDER — MAGNESIUM SULFATE 40 MG/ML IJ SOLN
2.0000 g | Freq: Once | INTRAMUSCULAR | Status: AC
  Administered 2013-12-15: 2 g via INTRAVENOUS
  Filled 2013-12-15: qty 50

## 2013-12-15 MED ORDER — DEXTROSE-NACL 5-0.9 % IV SOLN
INTRAVENOUS | Status: DC
  Administered 2013-12-15: 11:00:00 via INTRAVENOUS

## 2013-12-15 MED ORDER — SODIUM PHOSPHATE 3 MMOLE/ML IV SOLN
30.0000 mmol | Freq: Once | INTRAVENOUS | Status: AC
  Administered 2013-12-15: 30 mmol via INTRAVENOUS
  Filled 2013-12-15: qty 10

## 2013-12-15 MED ORDER — ONDANSETRON HCL 4 MG/2ML IJ SOLN
4.0000 mg | INTRAMUSCULAR | Status: DC | PRN
  Administered 2013-12-18: 4 mg via INTRAVENOUS
  Filled 2013-12-15: qty 2

## 2013-12-15 MED ORDER — ACETAMINOPHEN 650 MG RE SUPP
650.0000 mg | RECTAL | Status: DC | PRN
  Administered 2013-12-15 (×2): 650 mg via RECTAL
  Filled 2013-12-15 (×2): qty 1

## 2013-12-15 NOTE — Progress Notes (Signed)
CCS paged to inform them of spike in patient's temperature up to 102.8 oral. No tylenol in MAR to treat. Patient has also stated that he has had waves of intermittent nausea. Will ask for zofran as well. Will continue to monitor. Richardean Sale, RN

## 2013-12-15 NOTE — Progress Notes (Signed)
PULMONARY / CRITICAL CARE MEDICINE   Name: Evan Leonard MRN: [id number] DOB: [birth year]    ADMISSION DATE:  12/13/2013 CONSULTATION DATE:  12/13/2013  REFERRING MD :  Hulen Skains  CHIEF COMPLAINT:  Incarcerated umbilical hernia  INITIAL PRESENTATION:  53 yo male with hx of umbilical hernia presented with abdominal pain.  Taken to OR for incarcerated umbilical hernia.  Remained on vent post-op.  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 Annamaria Boots.  STUDIES:  7/89 CT abd > Umbilical hernia with small bowel, no signs of obstruction. Increased attenuation of R kidney. 41mm non-obstructing stone in R kidney. 38mm stone in bladder.   SIGNIFICANT EVENTS: 7/20 emergent surgery for incarcerated umbilical hernia.   SUBJECTIVE: Awake and interactive, speaking in full sentences.  VITAL SIGNS: Temp:  [97.9 F (36.6 C)-99.6 F (37.6 C)] 98.4 F (36.9 C) (07/22 0833) Pulse Rate:  [86-100] 98 (07/22 0800) Resp:  [18-25] 24 (07/22 0800) BP: (112-151)/(66-85) 143/70 mmHg (07/22 0800) SpO2:  [91 %-100 %] 95 % (07/22 0800) Weight:  [396 lb (179.624 kg)] 396 lb (179.624 kg) (07/22 0500) HEMODYNAMICS:   VENTILATOR SETTINGS:   INTAKE / OUTPUT: Intake/Output     07/21 0701 - 07/22 0700 07/22 0701 - 07/23 0700   I.V. (mL/kg) 2710 (15.1) 250 (1.4)   NG/GT 60 30   IV Piggyback 200 50   Total Intake(mL/kg) 2970 (16.5) 330 (1.8)   Urine (mL/kg/hr) 2720 (0.6) 125 (0.3)   Emesis/NG output 650 (0.2)    Other     Blood     Total Output 3370 125   Net -400 +205          PHYSICAL EXAMINATION: General:  Morbidly obese male (369 lbs) breathing comfortably on 3L [place] Neuro: Awake and interactive, following commands, moving all ext HEENT:  /AT, PERRL, no JVD noted Cardiovascular:  Tachy, regular, normal S1/S2, -M/R/g Lungs:  Clear anteriorly Abdomen:  Obese, abd binder in place. Hypoactive BS Musculoskeletal:  No acute deformity Skin:  Surgical incision to abdomen under  abd binder.   LABS:  CBC  Recent Labs Lab 12/13/13 1702 12/13/13 1726 12/14/13 0349 12/15/13 0231  WBC 22.5*  --  19.6* 18.4*  HGB 14.7 16.3 15.1 13.2  HCT 43.5 48.0 45.5 39.1  PLT 253  --  237 177   Coag's  Recent Labs Lab 12/14/13 0349  INR 1.17   BMET  Recent Labs Lab 12/13/13 1702 12/13/13 1726 12/14/13 0349 12/15/13 0231  NA 136* 137 135* 134*  K 4.6 4.5 5.6* 4.4  CL 95* 98 98 97  CO2 26  --  20 22  BUN 16 17 15 9   CREATININE 1.17 1.40* 0.95 0.76  GLUCOSE 114* 122* 130* 130*   Electrolytes  Recent Labs Lab 12/13/13 1702 12/14/13 0349 12/15/13 0231  CALCIUM 8.9 8.3* 7.7*  MG  --   --  1.9  PHOS  --   --  1.6*   ABG  Recent Labs Lab 12/14/13 0114 12/15/13 0340  PHART 7.340* 7.438  PCO2ART 41.6 36.2  PO2ART 63.5* 76.5*   Liver Enzymes  Recent Labs Lab 12/10/13 1917  AST 23  ALT 32  ALKPHOS 66  BILITOT 0.7  ALBUMIN 3.8   Glucose  Recent Labs Lab 12/14/13 1224 12/14/13 1653 12/14/13 1930 12/14/13 2339 12/15/13 0330 12/15/13 0751  GLUCAP 166* 136* 119* 147* 156* 153*    Imaging Dg Chest Port 1 View  12/14/2013   CLINICAL DATA:  Endotracheal tube placement. Postoperative radiograph.  EXAM: PORTABLE CHEST - 1 VIEW  COMPARISON:  Chest radiograph performed 03/22/2013, and CTA of the chest performed 03/23/2013  FINDINGS: The patient's endotracheal tube is seen ending just above the carina. This should be retracted 2-3 cm. An enteric tube is noted extending below the diaphragm.  The lungs are significantly hypoexpanded, with mild bilateral atelectasis. No pleural effusion or pneumothorax is seen.  The cardiomediastinal silhouette is mildly enlarged. No acute osseous abnormalities are seen.  IMPRESSION: 1. Endotracheal tube seen ending just above the carina. This should be retracted 2-3 cm. 2. Lung significantly hypoexpanded, with mild bilateral atelectasis. Mild cardiomegaly noted.  These results were called by telephone at the time of  interpretation on 12/14/2013 at 1:34 am to Dr. Naida Sleight, who verbally acknowledged these results.   Electronically Signed   By: Garald Balding M.D.   On: 12/14/2013 01:36   ASSESSMENT / PLAN:  PULMONARY [place] 7/20 > A: Acute respiratory failure in post operative setting. H/o OSA with difficulty to tolerate CPAP as outpt. Extubated but I remain concerned for his respiratory status. P:   - Titrate O2 for sats. - IS per RT. - Will need BiPAP after NGT is out. - Ambulate. - Decrease IVF. - CXR in AM. - Hold in the ICU for monitoring  CARDIOVASCULAR A:  Hypertension. Hx of DVT, PE.  Completed anti-coag course, not on coumadin at home. P:  - Continuous cardiac/NIMP monitoring - Hydralazine PRN to keep SBP under 124mm/Hg - Resume ASA when okay with surgery  RENAL A:   Renal calculi, incidental finding on recent CT Hyperkalemia resolved but now Na, Mg and Phos all low. P:   - Change IVF to D5 NS at 75. - Follow BMP - Replace electrolytes as indicated.  GASTROINTESTINAL A:   S/p surgical repair of incarcerated umbilical hernia P:   - Management per CCS. - Protonix for ppx. - NPO and maintain NGT per surgeries recommendations.  HEMATOLOGIC A:   VTE ppx P:  - Lovenox. - Follow CBC.  INFECTIOUS A:   Cellulitis P:   - Abx: Zosyn, start date 7/21, day 1 of ?, will continue for now until WBC improves.  ENDOCRINE A:  DM P:   - CBG monitoring and SSI while intubated - Lantus 15units - Hold metformin while NPO  NEUROLOGIC A:  Acute encephalopathy in post op setting and 2nd to medical sedation.  P:   - PRN fentanyl for pain but minimize to hopefully allow for bowel function to return.  TODAY'S SUMMARY: Hold in ICU overnight, minimize fentanyl, will need CPAP when NGT is out (hopefully soon), ambulate and IS.  CC time 35 minutes.  Rush Farmer, M.D. [facility] Pulmonary/Critical Care Medicine. Pager: [phone]. After hours pager: [phone].

## 2013-12-15 NOTE — Progress Notes (Signed)
2 Days Post-Op  Subjective: Pt doing well this AM.  Some abd soreness.  Objective: Vital signs in last 24 hours: Temp:  [97.9 F (36.6 C)-99.6 F (37.6 C)] 98.6 F (37 C) (07/22 0349) Pulse Rate:  [86-104] 95 (07/22 0700) Resp:  [18-25] 24 (07/22 0700) BP: (111-151)/(66-85) 141/81 mmHg (07/22 0700) SpO2:  [91 %-100 %] 96 % (07/22 0700) Weight:  [396 lb (179.624 kg)] 396 lb (179.624 kg) (07/22 0500)    Intake/Output from previous day: 07/21 0701 - 07/22 0700 In: 2970 [I.V.:2710; NG/GT:60; IV Piggyback:200] Out: 3220 [Urine:2570; Emesis/NG output:650] Intake/Output this shift:    General appearance: alert and cooperative GI: soft, approp ttp, wound c/d/i, packed  Lab Results:   Recent Labs  12/14/13 0349 12/15/13 0231  WBC 19.6* 18.4*  HGB 15.1 13.2  HCT 45.5 39.1  PLT 237 177   BMET  Recent Labs  12/14/13 0349 12/15/13 0231  NA 135* 134*  K 5.6* 4.4  CL 98 97  CO2 20 22  GLUCOSE 130* 130*  BUN 15 9  CREATININE 0.95 0.76  CALCIUM 8.3* 7.7*   PT/INR  Recent Labs  12/14/13 0349  LABPROT 14.9  INR 1.17   ABG  Recent Labs  12/14/13 0114 12/15/13 0340  PHART 7.340* 7.438  HCO3 21.8 24.1*    Studies/Results: Dg Chest Port 1 View  12/14/2013   CLINICAL DATA:  Endotracheal tube placement. Postoperative radiograph.  EXAM: PORTABLE CHEST - 1 VIEW  COMPARISON:  Chest radiograph performed 03/22/2013, and CTA of the chest performed 03/23/2013  FINDINGS: The patient's endotracheal tube is seen ending just above the carina. This should be retracted 2-3 cm. An enteric tube is noted extending below the diaphragm.  The lungs are significantly hypoexpanded, with mild bilateral atelectasis. No pleural effusion or pneumothorax is seen.  The cardiomediastinal silhouette is mildly enlarged. No acute osseous abnormalities are seen.  IMPRESSION: 1. Endotracheal tube seen ending just above the carina. This should be retracted 2-3 cm. 2. Lung significantly hypoexpanded,  with mild bilateral atelectasis. Mild cardiomegaly noted.  These results were called by telephone at the time of interpretation on 12/14/2013 at 1:34 am to Dr. Naida Sleight, who verbally acknowledged these results.   Electronically Signed   By: Garald Balding M.D.   On: 12/14/2013 01:36   Dg Abd 2 Views  12/13/2013   CLINICAL DATA:  Abdominal pain.  No bowel movement for 2 weeks.  EXAM: ABDOMEN - 2 VIEW  COMPARISON:  Abdominal pelvic CT 12/10/2013.  FINDINGS: There is interval development of moderate diffuse small bowel distension with associated air-fluid levels on the erect examination. The colon appears relatively decompressed. There is no evidence of free intraperitoneal air. Calculi in the lower pole of the right kidney and in the bladder are again noted. There are stable degenerative changes of the spine.  IMPRESSION: 1. Interval development of small bowel distension with air-fluid levels concerning for small bowel obstruction, possibly from the umbilical hernia containing small bowel on previous CT. 2. Grossly stable right renal and bladder calculi.   Electronically Signed   By: Camie Patience M.D.   On: 12/13/2013 18:53    Anti-infectives: Anti-infectives   Start     Dose/Rate Route Frequency Ordered Stop   12/14/13 0000  piperacillin-tazobactam (ZOSYN) IVPB 3.375 g     3.375 g 12.5 mL/hr over 240 Minutes Intravenous Every 8 hours 12/13/13 2352     12/13/13 1915  [MAR Hold]  piperacillin-tazobactam (ZOSYN) IVPB 3.375 g  Status:  Discontinued     (  On MAR Hold since 12/13/13 2049)   3.375 g 100 mL/hr over 30 Minutes Intravenous 4 times per day 12/13/13 1913 12/13/13 2205   12/13/13 1745  clindamycin (CLEOCIN) IVPB 600 mg     600 mg 100 mL/hr over 30 Minutes Intravenous  Once 12/13/13 1738 12/13/13 1925      Assessment/Plan: s/p Procedure(s): EXPLORATORY LAPAROTOMY Repair ventral hernia, without mesh, partial omentectomy (N/A) Consult PT con't NGT until bowel functoning well BID dressing  changes Mobilize Abx  LOS: 2 days    Evan Jacks., Evan Leonard 12/15/2013

## 2013-12-16 ENCOUNTER — Inpatient Hospital Stay (HOSPITAL_COMMUNITY): Payer: PRIVATE HEALTH INSURANCE

## 2013-12-16 DIAGNOSIS — D72829 Elevated white blood cell count, unspecified: Secondary | ICD-10-CM

## 2013-12-16 DIAGNOSIS — R509 Fever, unspecified: Secondary | ICD-10-CM

## 2013-12-16 DIAGNOSIS — K420 Umbilical hernia with obstruction, without gangrene: Principal | ICD-10-CM

## 2013-12-16 LAB — CBC
HCT: 37.6 % — ABNORMAL LOW (ref 39.0–52.0)
Hemoglobin: 12.2 g/dL — ABNORMAL LOW (ref 13.0–17.0)
MCH: 28.8 pg (ref 26.0–34.0)
MCHC: 32.4 g/dL (ref 30.0–36.0)
MCV: 88.9 fL (ref 78.0–100.0)
Platelets: 227 10*3/uL (ref 150–400)
RBC: 4.23 MIL/uL (ref 4.22–5.81)
RDW: 14.2 % (ref 11.5–15.5)
WBC: 23.3 10*3/uL — ABNORMAL HIGH (ref 4.0–10.5)

## 2013-12-16 LAB — URINALYSIS, ROUTINE W REFLEX MICROSCOPIC
Glucose, UA: NEGATIVE mg/dL
Ketones, ur: 15 mg/dL — AB
Nitrite: NEGATIVE
Protein, ur: NEGATIVE mg/dL
Specific Gravity, Urine: 1.027 (ref 1.005–1.030)
Urobilinogen, UA: 1 mg/dL (ref 0.0–1.0)
pH: 5.5 (ref 5.0–8.0)

## 2013-12-16 LAB — GLUCOSE, CAPILLARY
Glucose-Capillary: 109 mg/dL — ABNORMAL HIGH (ref 70–99)
Glucose-Capillary: 113 mg/dL — ABNORMAL HIGH (ref 70–99)
Glucose-Capillary: 115 mg/dL — ABNORMAL HIGH (ref 70–99)
Glucose-Capillary: 131 mg/dL — ABNORMAL HIGH (ref 70–99)
Glucose-Capillary: 139 mg/dL — ABNORMAL HIGH (ref 70–99)
Glucose-Capillary: 143 mg/dL — ABNORMAL HIGH (ref 70–99)

## 2013-12-16 LAB — URINE MICROSCOPIC-ADD ON

## 2013-12-16 LAB — MAGNESIUM: Magnesium: 2.2 mg/dL (ref 1.5–2.5)

## 2013-12-16 LAB — BASIC METABOLIC PANEL
Anion gap: 11 (ref 5–15)
BUN: 8 mg/dL (ref 6–23)
CO2: 26 mEq/L (ref 19–32)
Calcium: 8 mg/dL — ABNORMAL LOW (ref 8.4–10.5)
Chloride: 99 mEq/L (ref 96–112)
Creatinine, Ser: 0.84 mg/dL (ref 0.50–1.35)
GFR calc Af Amer: >90 mL/min (ref >90)
GFR calc non Af Amer: >90 mL/min (ref >90)
Glucose, Bld: 128 mg/dL — ABNORMAL HIGH (ref 70–99)
Potassium: 3.9 mEq/L (ref 3.7–5.3)
Sodium: 136 mEq/L — ABNORMAL LOW (ref 137–147)

## 2013-12-16 LAB — PHOSPHORUS: Phosphorus: 2.2 mg/dL — ABNORMAL LOW (ref 2.3–4.6)

## 2013-12-16 MED ORDER — ENOXAPARIN SODIUM 100 MG/ML ~~LOC~~ SOLN
0.5000 mg/kg | SUBCUTANEOUS | Status: DC
  Administered 2013-12-16 – 2013-12-20 (×5): 90 mg via SUBCUTANEOUS
  Filled 2013-12-16 (×7): qty 1

## 2013-12-16 MED ORDER — FENTANYL CITRATE 0.05 MG/ML IJ SOLN
50.0000 ug | INTRAMUSCULAR | Status: DC | PRN
  Administered 2013-12-16 – 2013-12-18 (×14): 100 ug via INTRAVENOUS
  Administered 2013-12-18: 50 ug via INTRAVENOUS
  Administered 2013-12-18: 100 ug via INTRAVENOUS
  Administered 2013-12-19: 50 ug via INTRAVENOUS
  Administered 2013-12-19: 100 ug via INTRAVENOUS
  Administered 2013-12-19: 50 ug via INTRAVENOUS
  Administered 2013-12-19 – 2013-12-22 (×8): 100 ug via INTRAVENOUS
  Filled 2013-12-16 (×27): qty 2

## 2013-12-16 MED ORDER — POTASSIUM PHOSPHATES 15 MMOLE/5ML IV SOLN
30.0000 mmol | Freq: Once | INTRAVENOUS | Status: AC
  Administered 2013-12-16: 30 mmol via INTRAVENOUS
  Filled 2013-12-16: qty 10

## 2013-12-16 MED ORDER — SODIUM CHLORIDE 0.9 % IV SOLN
2500.0000 mg | Freq: Once | INTRAVENOUS | Status: AC
  Administered 2013-12-16: 2500 mg via INTRAVENOUS
  Filled 2013-12-16: qty 2500

## 2013-12-16 MED ORDER — FUROSEMIDE 10 MG/ML IJ SOLN
40.0000 mg | Freq: Four times a day (QID) | INTRAMUSCULAR | Status: AC
  Administered 2013-12-16 (×3): 40 mg via INTRAVENOUS
  Filled 2013-12-16 (×3): qty 4

## 2013-12-16 MED ORDER — VANCOMYCIN HCL 10 G IV SOLR
1500.0000 mg | Freq: Two times a day (BID) | INTRAVENOUS | Status: DC
  Administered 2013-12-16 – 2013-12-17 (×3): 1500 mg via INTRAVENOUS
  Filled 2013-12-16 (×5): qty 1500

## 2013-12-16 NOTE — Evaluation (Signed)
Physical Therapy Evaluation Patient Details Name: Evan Leonard MRN: [id number] DOB: [birth year] Today's Date: 12/16/2013   History of Present Illness  Pt adm with incarcerated umbilical hernia. Underwent emergent repair on 7/20. Pt with open abd wound. History - morbid obesity, DM, PE  Clinical Impression  Pt admitted with above. Pt currently with functional limitations due to the deficits listed below (see PT Problem List).  Pt will benefit from skilled PT to increase their independence and safety with mobility to allow discharge to the venue listed below.        Follow Up Recommendations LTACH    Equipment Recommendations  Rolling walker with 5" wheels    Recommendations for Other Services OT consult     Precautions / Restrictions Precautions Precautions: Fall      Mobility  Bed Mobility Overal bed mobility: Needs Assistance Bed Mobility: Sidelying to Sit   Sidelying to sit: Min assist;HOB elevated       General bed mobility comments: Assist to bring legs off and to bring trunk up.  Transfers Overall transfer level: Needs assistance Equipment used: Pushed w/c Transfers: Sit to/from [employer] Sit to Stand: +2 physical assistance;Mod assist;From elevated surface Stand pivot transfers: +2 physical assistance;Min assist       General transfer comment: Assist to bring hips up. Verbal cues for technique.  Ambulation/Gait Ambulation/Gait assistance: +2 physical assistance;Min assist Ambulation Distance (Feet): 2 Feet Assistive device:  (w/c) Gait Pattern/deviations: Decreased step length - right;Decreased step length - left;Shuffle;Trunk flexed;Antalgic Gait velocity: decr Gait velocity interpretation: Below normal speed for age/gender General Gait Details: Verbal cues to stand more erect. Pt c/o RLE pain and feels it may give out.  Stairs            Wheelchair Mobility    Modified Rankin (Stroke Patients Only)       Balance Overall  balance assessment: Needs assistance Sitting-balance support: No upper extremity supported Sitting balance-Leahy Scale: Fair     Standing balance support: Bilateral upper extremity supported Standing balance-Leahy Scale: Poor                               Pertinent Vitals/Pain Dyspnea 3/4 with mobility on O2.    Home Living Family/patient expects to be discharged to:: Private residence Living Arrangements: Alone   Type of Home: House Home Access: Level entry     Home Layout: One level Home Equipment: None      Prior Function Level of Independence: Independent               Hand Dominance        Extremity/Trunk Assessment   Upper Extremity Assessment: Generalized weakness           Lower Extremity Assessment: Generalized weakness         Communication   Communication: No difficulties  Cognition Arousal/Alertness: Awake/alert Behavior During Therapy: WFL for tasks assessed/performed Overall Cognitive Status: Within Functional Limits for tasks assessed                      General Comments      Exercises        Assessment/Plan    PT Assessment Patient needs continued PT services  PT Diagnosis Difficulty walking;Generalized weakness;Acute pain   PT Problem List Decreased strength;Decreased activity tolerance;Decreased balance;Decreased mobility;Decreased knowledge of use of DME;Obesity  PT Treatment Interventions DME instruction;Gait training;Functional mobility training;Therapeutic activities;Therapeutic exercise;Balance training;Patient/family education  PT Goals (Current goals can be found in the Care Plan section) Acute Rehab PT Goals Patient Stated Goal: Return home PT Goal Formulation: With patient Time For Goal Achievement: 12/30/13 Potential to Achieve Goals: Good    Frequency Min 3X/week   Barriers to discharge Decreased caregiver support      Co-evaluation               End of Session Equipment  Utilized During Treatment:  (too large for gait belt) Activity Tolerance: Patient limited by fatigue Patient left: in chair;with call bell/phone within reach Nurse Communication: Mobility status         Time: 1640-1710 PT Time Calculation (min): 30 min   Charges:   PT Evaluation $Initial PT Evaluation Tier I: 1 Procedure PT Treatments $Gait Training: 23-37 mins   PT G Codes:          Evan Leonard December 29, 2013, 6:07 PM  [facility] PT [phone]

## 2013-12-16 NOTE — Progress Notes (Signed)
ANTIBIOTIC CONSULT NOTE - INITIAL  Pharmacy Consult for vancomycin  Indication: rule out pneumonia  No Known Allergies  Patient Measurements: Height: 5' 10.47" (179 cm) Weight: 387 lb (175.542 kg) IBW/kg (Calculated) : 74.09  Vital Signs: Temp: 98.1 F (36.7 C) (07/23 0401) Temp src: Oral (07/23 0401) BP: 117/82 mmHg (07/23 0800) Pulse Rate: 103 (07/23 0800) Intake/Output from previous day: 07/22 0701 - 07/23 0700 In: 2265 [I.V.:1750; NG/GT:55; IV Piggyback:460] Out: 3385 [Urine:2885; Emesis/NG output:500] Intake/Output from this shift: Total I/O In: 125 [I.V.:75; IV Piggyback:50] Out: 30 [Urine:30]  Labs:  Recent Labs  12/14/13 0349 12/15/13 0231 12/16/13 0225  WBC 19.6* 18.4* 23.3*  HGB 15.1 13.2 12.2*  PLT 237 177 227  CREATININE 0.95 0.76 0.84   Estimated Creatinine Clearance: 163.1 ml/min (by C-G formula based on Cr of 0.84). No results found for this basename: VANCOTROUGH, Corlis Leak, VANCORANDOM, GENTTROUGH, GENTPEAK, GENTRANDOM, TOBRATROUGH, TOBRAPEAK, TOBRARND, AMIKACINPEAK, AMIKACINTROU, AMIKACIN,  in the last 72 hours   Microbiology: Recent Results (from the past 720 hour(s))  URINE CULTURE     Status: None   Collection Time    12/02/13  6:07 PM      Result Value Ref Range Status   Specimen Description URINE, CLEAN CATCH   Final   Special Requests NONE   Final   Culture  Setup Time     Final   Value: 12/03/2013 00:26     Performed at [place]     Final   Value: >=100,000 COLONIES/ML     Performed at [facility]   Culture     Final   Value: ESCHERICHIA COLI     Performed at [facility]   Report Status 12/05/2013 FINAL   Final   Organism ID, Bacteria ESCHERICHIA COLI   Final  URINE CULTURE     Status: None   Collection Time    12/10/13 11:12 PM      Result Value Ref Range Status   Specimen Description URINE, CATHETERIZED   Final   Special Requests NONE   Final   Culture  Setup Time     Final    Value: 12/11/2013 01:00     Performed at [place]     Final   Value: NO GROWTH     Performed at [facility]   Culture     Final   Value: NO GROWTH     Performed at [facility]   Report Status 12/12/2013 FINAL   Final    Medical History: Past Medical History  Diagnosis Date  . Obesity   . Pulmonary embolism 05/04/2012    bilaterally  . Exertional dyspnea 05/04/2012    "isolated episode" (05/05/2012)  . Sleep apnea     "pretty sure; never been tested" (05/05/2012)  . History of chickenpox   . DVT (deep venous thrombosis)   . Diabetes mellitus, type II 02/2013    Medications:  Anti-infectives   Start     Dose/Rate Route Frequency Ordered Stop   12/16/13 2200  vancomycin (VANCOCIN) 1,500 mg in sodium chloride 0.9 % 500 mL IVPB     1,500 mg 250 mL/hr over 120 Minutes Intravenous Every 12 hours 12/16/13 0841     12/16/13 0930  vancomycin (VANCOCIN) 2,500 mg in sodium chloride 0.9 % 500 mL IVPB     2,500 mg 250 mL/hr over 120 Minutes Intravenous  Once 12/16/13 0841     12/14/13  0000  piperacillin-tazobactam (ZOSYN) IVPB 3.375 g     3.375 g 12.5 mL/hr over 240 Minutes Intravenous Every 8 hours 12/13/13 2352     12/13/13 1915  [MAR Hold]  piperacillin-tazobactam (ZOSYN) IVPB 3.375 g  Status:  Discontinued     (On MAR Hold since 12/13/13 2049)   3.375 g 100 mL/hr over 30 Minutes Intravenous 4 times per day 12/13/13 1913 12/13/13 2205   12/13/13 1745  clindamycin (CLEOCIN) IVPB 600 mg     600 mg 100 mL/hr over 30 Minutes Intravenous  Once 12/13/13 1738 12/13/13 1925     Assessment: 35 yom initially presented with abdominal pain and nausea on 7/20. S/p exlap with hernia repair and was started on zosyn empirically. Today is D#4 of zosyn. Pt spiked a fever of 103.1 and chest x-ray has worsened to MD assessment so vancomycin will be added on for possible pneumonia. WBC is elevated at 23.3 and cultures are pending.  Zosyn 7/20>> Vanc  7/23>>  Goal of Therapy:  Vancomycin trough level 15-20 mcg/ml  Plan:  1. Vancomycin 2500mg  IV x 1 then 1500mg  IV Q12H 2. Continue zosyn 3.375gm IV Q8H (4 hr inf) 3. F/u renal fxn, C&S, clinical status and trough at [facility], Rande Lawman 12/16/2013,8:41 AM

## 2013-12-16 NOTE — Progress Notes (Signed)
I have seen and examined the pt and agree with PA-Osborne's progress note. con't dressing changes Will obtain KUB Pulm toilet

## 2013-12-16 NOTE — Progress Notes (Signed)
Patient ID: Evan Leonard, male   DOB: [birth year], 53 y.o.   MRN: [id number] 3 Days Post-Op  Subjective: Patient feels okay this morning but says that he needs a little more for pain. He is not passing flatus. He is intermittently coughing up a little bit but does not complain of shortness of breath. This is his first time sitting up in a chair. Biggest complaint is pain in his right leg, do to restless leg syndrome  Objective: Vital signs in last 24 hours: Temp:  [98.1 F (36.7 C)-103.1 F (39.5 C)] 98.1 F (36.7 C) (07/23 0401) Pulse Rate:  [95-121] 103 (07/23 0800) Resp:  [18-30] 22 (07/23 0600) BP: (117-152)/(67-83) 117/82 mmHg (07/23 0800) SpO2:  [89 %-100 %] 94 % (07/23 0800) Weight:  [387 lb (175.542 kg)] 387 lb (175.542 kg) (07/23 0500)    Intake/Output from previous day: 07/22 0701 - 07/23 0700 In: 2265 [I.V.:1750; NG/GT:55; IV Piggyback:460] Out: 3385 [Urine:2885; Emesis/NG output:500] Intake/Output this shift: Total I/O In: 125 [I.V.:75; IV Piggyback:50] Out: 30 [Urine:30]  PE: Abd: Soft, morbidly obese, cellulitis is improving, wound is clean. This was unpacked and repacked. His fascia was probed and this is tightly closed. He does not have any active bowel sounds currently. NG tube still was some bilious output  Lab Results:   Recent Labs  12/15/13 0231 12/16/13 0225  WBC 18.4* 23.3*  HGB 13.2 12.2*  HCT 39.1 37.6*  PLT 177 227   BMET  Recent Labs  12/15/13 0231 12/16/13 0225  NA 134* 136*  K 4.4 3.9  CL 97 99  CO2 22 26  GLUCOSE 130* 128*  BUN 9 8  CREATININE 0.76 0.84  CALCIUM 7.7* 8.0*   PT/INR  Recent Labs  12/14/13 0349  LABPROT 14.9  INR 1.17   CMP     Component Value Date/Time   NA 136* 12/16/2013 0225   K 3.9 12/16/2013 0225   CL 99 12/16/2013 0225   CO2 26 12/16/2013 0225   GLUCOSE 128* 12/16/2013 0225   BUN 8 12/16/2013 0225   CREATININE 0.84 12/16/2013 0225   CALCIUM 8.0* 12/16/2013 0225   PROT 8.2 12/10/2013 1917   ALBUMIN 3.8  12/10/2013 1917   AST 23 12/10/2013 1917   ALT 32 12/10/2013 1917   ALKPHOS 66 12/10/2013 1917   BILITOT 0.7 12/10/2013 1917   GFRNONAA >90 12/16/2013 0225   GFRAA >90 12/16/2013 0225   Lipase     Component Value Date/Time   LIPASE 20 12/10/2013 1917       Studies/Results: Dg Chest Port 1 View  12/16/2013   CLINICAL DATA:  Atelectasis. Exploratory laparotomy and ventral hernia repair.  EXAM: PORTABLE CHEST - 1 VIEW  COMPARISON:  12/14/2013  FINDINGS: Nasogastric tube is again noted with tip over the stomach in the left upper quadrant lungs are hypoinflated with mild bilateral linear perihilar density suggesting atelectasis. No evidence of effusion or pneumothorax. Cardiomediastinal silhouette and remainder of the exam is unchanged.  IMPRESSION: Persistent bilateral perihilar linear density suggesting atelectasis.  Nasogastric tube with tip over the stomach in the left upper quadrant.   Electronically Signed   By: Elberta Fortis M.D.   On: 12/16/2013 08:03    Anti-infectives: Anti-infectives   Start     Dose/Rate Route Frequency Ordered Stop   12/14/13 0000  piperacillin-tazobactam (ZOSYN) IVPB 3.375 g     3.375 g 12.5 mL/hr over 240 Minutes Intravenous Every 8 hours 12/13/13 2352     12/13/13 1915  [MAR Hold]  piperacillin-tazobactam (ZOSYN) IVPB 3.375 g  Status:  Discontinued     (On MAR Hold since 12/13/13 2049)   3.375 g 100 mL/hr over 30 Minutes Intravenous 4 times per day 12/13/13 1913 12/13/13 2205   12/13/13 1745  clindamycin (CLEOCIN) IVPB 600 mg     600 mg 100 mL/hr over 30 Minutes Intravenous  Once 12/13/13 1738 12/13/13 1925       Assessment/Plan  1. POD 3, status post exploratory laparotomy with primary repair of ventral/umbilical hernia, without mesh, partial omentectomy  2. Ventilator dependent respiratory failure, s/p extubation 3. Morbid obesity  4. History of bilateral pulmonary emboli  5. Obstructive sleep apnea  6. History of DVT  7. Diabetes mellitus  8.  Hyperkalemia, resolved  9. Leukocytosis, worsening 10. Fever up to 103  Plan: 1. The patient's chest x-ray looks a little bit worse at his apices. Given his increase in his white blood cell count as well as his fever, I have discussed the patient with Dr. Molli Knock of CCM. We will add vancomycin to his antibiotic regimen of Zosyn. He has also asked me to panculture him. 2. We will obtain a 2 view abdominal x-ray today. If the patient does not show a significant ileus, maybe we can try to get his NG tube out so that he can be placed on BiPAP or other respiratory methods as needed to help improve his pulmonary status. 3. PT evaluation has been ordered to help mobilize the patient. 4. His Lovenox has been increased to 0.5 mg per kilogram which equals 90 mg every 24 hours for DVT prophylaxis. 5. Continue sliding-scale insulin for diabetes while n.p.o. 6. Greatly appreciate CCM assistance with this patient. 7. Continue normal saline wet to dry dressing changes twice a day. 8. Continued and ICU for pulmonary monitoring for now.    LOS: 3 days    Nioka Thorington E 12/16/2013, 8:32 AM Pager: [phone]

## 2013-12-16 NOTE — Progress Notes (Signed)
PULMONARY / CRITICAL CARE MEDICINE   Name: Evan Leonard MRN: [id number] DOB: April 24, 1961    ADMISSION DATE:  12/13/2013 CONSULTATION DATE:  12/13/2013  REFERRING MD :  Hulen Skains  CHIEF COMPLAINT:  Incarcerated umbilical hernia  INITIAL PRESENTATION:  53 yo male with hx of umbilical hernia presented with abdominal pain.  Taken to OR for incarcerated umbilical hernia.  Remained on vent post-op.  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 Annamaria Boots.  STUDIES:  1/75 CT abd > Umbilical hernia with small bowel, no signs of obstruction. Increased attenuation of R kidney. 45mm non-obstructing stone in R kidney. 33mm stone in bladder.   SIGNIFICANT EVENTS: 7/20 emergent surgery for incarcerated umbilical hernia.   SUBJECTIVE: Awake and interactive, speaking in full sentences but febrile overnight.  VITAL SIGNS: Temp:  [97.7 F (36.5 C)-103.1 F (39.5 C)] 97.7 F (36.5 C) (07/23 0800) Pulse Rate:  [95-121] 107 (07/23 0900) Resp:  [18-30] 22 (07/23 0600) BP: (117-152)/(67-82) 117/82 mmHg (07/23 0800) SpO2:  [89 %-100 %] 96 % (07/23 0900) Weight:  [387 lb (175.542 kg)] 387 lb (175.542 kg) (07/23 0500) HEMODYNAMICS:   VENTILATOR SETTINGS:   INTAKE / OUTPUT: Intake/Output     07/22 0701 - 07/23 0700 07/23 0701 - 07/24 0700   I.V. (mL/kg) 1750 (10) 150 (0.9)   NG/GT 55    IV Piggyback 460 50   Total Intake(mL/kg) 2265 (12.9) 200 (1.1)   Urine (mL/kg/hr) 2885 (0.7) 30 (0.1)   Emesis/NG output 500 (0.1)    Total Output 3385 30   Net -1120 +170         PHYSICAL EXAMINATION: General:  Morbidly obese male (369 lbs) breathing comfortably on 3L [place] Neuro: Awake and interactive, following commands, moving all ext HEENT:  [place]/AT, PERRL, no JVD noted Cardiovascular:  Tachy, regular, normal S1/S2, -M/R/g Lungs:  Clear anteriorly Abdomen:  Obese, abd binder in place. Hypoactive BS Musculoskeletal:  No acute deformity Skin:  Surgical incision to abdomen under abd  binder.   LABS:  CBC  Recent Labs Lab 12/14/13 0349 12/15/13 0231 12/16/13 0225  WBC 19.6* 18.4* 23.3*  HGB 15.1 13.2 12.2*  HCT 45.5 39.1 37.6*  PLT 237 177 227   Coag's  Recent Labs Lab 12/14/13 0349  INR 1.17   BMET  Recent Labs Lab 12/14/13 0349 12/15/13 0231 12/16/13 0225  NA 135* 134* 136*  K 5.6* 4.4 3.9  CL 98 97 99  CO2 20 22 26   BUN 15 9 8   CREATININE 0.95 0.76 0.84  GLUCOSE 130* 130* 128*   Electrolytes  Recent Labs Lab 12/14/13 0349 12/15/13 0231 12/16/13 0225  CALCIUM 8.3* 7.7* 8.0*  MG  --  1.9 2.2  PHOS  --  1.6* 2.2*   ABG  Recent Labs Lab 12/14/13 0114 12/15/13 0340  PHART 7.340* 7.438  PCO2ART 41.6 36.2  PO2ART 63.5* 76.5*   Liver Enzymes  Recent Labs Lab 12/10/13 1917  AST 23  ALT 32  ALKPHOS 66  BILITOT 0.7  ALBUMIN 3.8   Glucose  Recent Labs Lab 12/15/13 1221 12/15/13 1554 12/15/13 1924 12/15/13 2336 12/16/13 0347 12/16/13 0827  GLUCAP 98 126* 131* 143* 143* 139*    Imaging No results found.  ASSESSMENT / PLAN:  PULMONARY Oett 7/20 > A: Acute respiratory failure in post operative setting. H/o OSA with difficulty to tolerate CPAP as outpt. Extubated but I remain concerned for his respiratory status. Concern for PNA on CXR but doubtful  believe is all atelectasis and the bilateral upper lobe are artifactual given body habitus. ETT 7/20>>>7/22 P:   - Titrate O2 for sats. - IS per RT. - Will need BiPAP after NGT is out. - Ambulate. - KVO IVF. - CXR in AM. - Hold in the ICU for monitoring given worsening respiratory status and worsening ileus.  CARDIOVASCULAR A:  Hypertension. Hx of DVT, PE.  Completed anti-coag course, not on coumadin at home. PIV P:  - Continuous cardiac/NIMP monitoring - Hydralazine PRN to keep SBP under 110mm/Hg - Resume ASA when okay with surgery  RENAL A:   Renal calculi, incidental finding on recent CT Hyperkalemia resolved but now Na and Phos all low. P:   -  KVO IVF. - Follow BMP - Replace electrolytes as indicated.  GASTROINTESTINAL A:   S/p surgical repair of incarcerated umbilical hernia P:   - Management per CCS. - Protonix for ppx. - NPO and maintain NGT per surgeries recommendations.  HEMATOLOGIC A:   VTE ppx P:  - Lovenox. - Follow CBC.  INFECTIOUS A:   Cellulitis with fever and increase in WBC. P:   - Abx: Zosyn 7/21>>> - Add Vancomycin 7/23>>> - Blood cultures 7/23>>> - Urine cultures 7/23>>>  ENDOCRINE A:  DM P:   - Lantus 15units - Hold metformin while NPO  NEUROLOGIC A:  Acute encephalopathy in post op setting and 2nd to medical sedation.  P:   - PRN fentanyl for pain but minimize to hopefully allow for bowel function to return.  TODAY'S SUMMARY: Hold in ICU overnight given worsening sepsis and worsening respiratory status and ileus, add vanc and pan culture, diurese and KVO IVF today.  Will order echo.  CC time 35 minutes.  Rush Farmer, M.D. [facility] Pulmonary/Critical Care Medicine. Pager: [phone]. After hours pager: [phone].

## 2013-12-17 DIAGNOSIS — I2699 Other pulmonary embolism without acute cor pulmonale: Secondary | ICD-10-CM

## 2013-12-17 LAB — URINE CULTURE
Colony Count: NO GROWTH
Culture: NO GROWTH

## 2013-12-17 LAB — GLUCOSE, CAPILLARY
Glucose-Capillary: 111 mg/dL — ABNORMAL HIGH (ref 70–99)
Glucose-Capillary: 110 mg/dL — ABNORMAL HIGH (ref 70–99)
Glucose-Capillary: 86 mg/dL (ref 70–99)
Glucose-Capillary: 89 mg/dL (ref 70–99)
Glucose-Capillary: 106 mg/dL — ABNORMAL HIGH (ref 70–99)

## 2013-12-17 LAB — BASIC METABOLIC PANEL
Anion gap: 14 (ref 5–15)
BUN: 12 mg/dL (ref 6–23)
CO2: 26 mEq/L (ref 19–32)
Calcium: 8.5 mg/dL (ref 8.4–10.5)
Chloride: 98 mEq/L (ref 96–112)
Creatinine, Ser: 0.93 mg/dL (ref 0.50–1.35)
GFR calc Af Amer: >90 mL/min (ref >90)
GFR calc non Af Amer: >90 mL/min (ref >90)
Glucose, Bld: 120 mg/dL — ABNORMAL HIGH (ref 70–99)
Potassium: 4 mEq/L (ref 3.7–5.3)
Sodium: 138 mEq/L (ref 137–147)

## 2013-12-17 LAB — CBC
HCT: 39.1 % (ref 39.0–52.0)
Hemoglobin: 12.8 g/dL — ABNORMAL LOW (ref 13.0–17.0)
MCH: 29 pg (ref 26.0–34.0)
MCHC: 32.7 g/dL (ref 30.0–36.0)
MCV: 88.7 fL (ref 78.0–100.0)
Platelets: 267 10*3/uL (ref 150–400)
RBC: 4.41 MIL/uL (ref 4.22–5.81)
RDW: 14.3 % (ref 11.5–15.5)
WBC: 23.3 10*3/uL — ABNORMAL HIGH (ref 4.0–10.5)

## 2013-12-17 LAB — MAGNESIUM: Magnesium: 2.2 mg/dL (ref 1.5–2.5)

## 2013-12-17 LAB — PHOSPHORUS: Phosphorus: 2.9 mg/dL (ref 2.3–4.6)

## 2013-12-17 NOTE — Progress Notes (Signed)
PULMONARY / CRITICAL CARE MEDICINE   Name: Evan Leonard MRN: [id number] DOB: November 05, 1960    ADMISSION DATE:  12/13/2013 CONSULTATION DATE:  12/13/2013  REFERRING MD :  Hulen Skains  CHIEF COMPLAINT:  Incarcerated umbilical hernia  INITIAL PRESENTATION:  53 yo male with hx of umbilical hernia presented with abdominal pain.  Taken to OR for incarcerated umbilical hernia.  Remained on vent post-op.  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 Annamaria Boots.  STUDIES:  4/09 CT abd > Umbilical hernia with small bowel, no signs of obstruction. Increased attenuation of R kidney. 35mm non-obstructing stone in R kidney. 58mm stone in bladder.   SIGNIFICANT EVENTS: 7/20 emergent surgery for incarcerated umbilical hernia.   SUBJECTIVE: Awake and interactive, speaking in full sentences, no events overnight.  VITAL SIGNS: Temp:  [97.9 F (36.6 C)-100 F (37.8 C)] 97.9 F (36.6 C) (07/24 0400) Pulse Rate:  [96-127] 100 (07/24 0600) Resp:  [21-32] 24 (07/24 0600) BP: (81-148)/(51-113) 133/84 mmHg (07/24 0600) SpO2:  [90 %-99 %] 96 % (07/24 0600) Weight:  [348 lb 15.8 oz (158.3 kg)] 348 lb 15.8 oz (158.3 kg) (07/24 0500) HEMODYNAMICS:   VENTILATOR SETTINGS:   INTAKE / OUTPUT: Intake/Output     07/23 0701 - 07/24 0700 07/24 0701 - 07/25 0700   I.V. (mL/kg) 300 (1.9)    NG/GT 90    IV Piggyback 1650    Total Intake(mL/kg) 2040 (12.9)    Urine (mL/kg/hr) 4575 (1.2) 150 (0.3)   Emesis/NG output 600 (0.2)    Total Output 5175 150   Net -3135 -150         PHYSICAL EXAMINATION: General:  Morbidly obese male breathing comfortably on 3L [place] Neuro: Awake and interactive, following commands, moving all ext HEENT:  [place]/AT, PERRL, no JVD noted Cardiovascular:  Tachy, regular, normal S1/S2, -M/R/g Lungs:  Clear anteriorly Abdomen:  Obese, abd binder in place. Hypoactive BS Musculoskeletal:  No acute deformity Skin:  Surgical incision to abdomen under abd binder.    LABS:  CBC  Recent Labs Lab 12/15/13 0231 12/16/13 0225 12/17/13 0500  WBC 18.4* 23.3* 23.3*  HGB 13.2 12.2* 12.8*  HCT 39.1 37.6* 39.1  PLT 177 227 267   Coag's  Recent Labs Lab 12/14/13 0349  INR 1.17   BMET  Recent Labs Lab 12/15/13 0231 12/16/13 0225 12/17/13 0500  NA 134* 136* 138  K 4.4 3.9 4.0  CL 97 99 98  CO2 22 26 26   BUN 9 8 12   CREATININE 0.76 0.84 0.93  GLUCOSE 130* 128* 120*   Electrolytes  Recent Labs Lab 12/15/13 0231 12/16/13 0225 12/17/13 0500  CALCIUM 7.7* 8.0* 8.5  MG 1.9 2.2 2.2  PHOS 1.6* 2.2* 2.9   ABG  Recent Labs Lab 12/14/13 0114 12/15/13 0340  PHART 7.340* 7.438  PCO2ART 41.6 36.2  PO2ART 63.5* 76.5*   Liver Enzymes  Recent Labs Lab 12/10/13 1917  AST 23  ALT 32  ALKPHOS 66  BILITOT 0.7  ALBUMIN 3.8   Glucose  Recent Labs Lab 12/16/13 1221 12/16/13 1628 12/16/13 1929 12/16/13 2335 12/17/13 0341 12/17/13 0744  GLUCAP 109* 113* 115* 131* 111* 110*   Imaging Dg Chest Port 1 View  12/16/2013   CLINICAL DATA:  Atelectasis. Exploratory laparotomy and ventral hernia repair.  EXAM: PORTABLE CHEST - 1 VIEW  COMPARISON:  12/14/2013  FINDINGS: Nasogastric tube is again noted with tip over the stomach in the left upper quadrant lungs are hypoinflated  with mild bilateral linear perihilar density suggesting atelectasis. No evidence of effusion or pneumothorax. Cardiomediastinal silhouette and remainder of the exam is unchanged.  IMPRESSION: Persistent bilateral perihilar linear density suggesting atelectasis.  Nasogastric tube with tip over the stomach in the left upper quadrant.   Electronically Signed   By: [name] M.D.   On: 12/16/2013 08:03   Dg Abd 2 Views  12/16/2013   CLINICAL DATA:  Abdominal pain. Shortness of breath. Assessment of ileus.  EXAM: ABDOMEN - 2 VIEW  COMPARISON:  12/13/2013  FINDINGS: Nasogastric tube projects over the stomach. Dextroconvex lumbar scoliosis. 8 mm right kidney lower pole  renal calculus. Several air-fluid levels are present in large and small bowel. Small bowel loops are dilated up to 5 cm. This is similar to the prior exam in magnitude, although more loops appear gas-filled today.  IMPRESSION: 1. Moderately dilated small bowel, similar to prior although more loops are gas filled, with several air-fluid levels. Abnormal but nonspecific appearance which could reflect ileus or obstruction. Correlate with bowel sounds and return of postoperative bowel function.   Electronically Signed   By: Sherryl Barters M.D.   On: 12/16/2013 09:38   ASSESSMENT / PLAN:  PULMONARY Oett 7/20 >7/21 A: Acute respiratory failure in post operative setting. H/o OSA with difficulty to tolerate CPAP as outpt. Extubated but I remain concerned for his respiratory status. Concern for PNA on CXR but doubtful believe is all atelectasis and the bilateral upper lobe are artifactual given body habitus. ETT 7/20>>>7/21 P:   - Titrate O2 for sats. - IS per RT. - CPAP once NGT is out. - Ambulate. - KVO IVF. - CXR in AM. - Transfer to floors.  CARDIOVASCULAR A:  Hypertension. Hx of DVT, PE.  Completed anti-coag course, not on coumadin at home. PIV P:  - Hydralazine PRN to keep SBP under 181mm/Hg - Resume ASA when okay with surgery  RENAL A:   Renal calculi, incidental finding on recent CT Hyperkalemia resolved but now Na and Phos all low. P:   - KVO IVF. - Follow BMP - Replace electrolytes as indicated.  GASTROINTESTINAL A:   S/p surgical repair of incarcerated umbilical hernia P:   - Management per CCS. - Protonix for ppx. - Diet per surgery.  HEMATOLOGIC A:   VTE ppx P:  - Lovenox. - Follow CBC.  INFECTIOUS A:   Cellulitis with fever and increase in WBC. P:   - Abx: Zosyn 7/21>>> - Vancomycin 7/23>>> - Blood cultures 7/23>>> - Urine cultures 7/23>>>  ENDOCRINE A:  DM P:   - Lantus 15units - Hold metformin while NPO  NEUROLOGIC A:  Acute  encephalopathy in post op setting and 2nd to medical sedation.  P:   - PRN fentanyl for pain but minimize to hopefully allow for bowel function to return.  TODAY'S SUMMARY: Transfer to floors, CPAP when NGT is out, abx per surgery, PCCM will sign off, please call back if needed.  Rush Farmer, M.D. [facility] Pulmonary/Critical Care Medicine. Pager: [phone]. After hours pager: [phone].

## 2013-12-17 NOTE — Progress Notes (Signed)
Physical Therapy Treatment Patient Details Name: Evan Leonard MRN: [id number] DOB: 1960-07-01 Today's Date: Jan 02, 2014    History of Present Illness Pt adm with incarcerated umbilical hernia. Underwent emergent repair on 7/20. Pt with open abd wound. History - morbid obesity, DM, PE    PT Comments    Pt with increased rt knee pain. With tenderness and slight edema just lateral to patellar tendon. Pt unable to attempt standing or gait due to knee pain.  Follow Up Recommendations  LTACH     Equipment Recommendations  Rolling walker with 5" wheels    Recommendations for Other Services OT consult     Precautions / Restrictions Precautions Precautions: Fall    Mobility  Bed Mobility                  Transfers                    Ambulation/Gait                 Stairs            Wheelchair Mobility    Modified Rankin (Stroke Patients Only)       Balance                                    Cognition Arousal/Alertness: Awake/alert Behavior During Therapy: WFL for tasks assessed/performed Overall Cognitive Status: Within Functional Limits for tasks assessed                      Exercises General Exercises - Upper Extremity Shoulder Flexion: AROM;Both;5 reps;Seated General Exercises - Lower Extremity Ankle Circles/Pumps: AROM;Both;10 reps;Seated Quad Sets: AROM;Both;10 reps;Seated Heel Slides: AAROM;AROM;Right;Left;5 reps;Seated    General Comments        Pertinent Vitals/Pain Rt knee pain. Repositioned.    Home Living                      Prior Function            PT Goals (current goals can now be found in the care plan section) Progress towards PT goals: Not progressing toward goals - comment (due to rt knee pain.)    Frequency  Min 3X/week    PT Plan Current plan remains appropriate    Co-evaluation             End of Session   Activity Tolerance: Patient limited by  pain Patient left: in chair;with call bell/phone within reach     Time: 1422-1430 PT Time Calculation (min): 8 min  Charges:  $Therapeutic Exercise: 8-22 mins                    G Codes:      Sephira Zellman 01-02-14, 3:16 PM  [facility] PT [phone]

## 2013-12-17 NOTE — Progress Notes (Signed)
I have seen and examined the pt and agree with PA-Osborne's progress note. Clamp NGT Con't WTD dressing changes

## 2013-12-17 NOTE — Progress Notes (Signed)
Patient ID: Evan Leonard, male   DOB: February 25, 1961, 53 y.o.   MRN: [id number] 4 Days Post-Op  Subjective: Patient feels okay, with no complaints. Pain is well-controlled.no shortness of breath. Passing some flatus. No bowel movement yet.  Objective: Vital signs in last 24 hours: Temp:  [97.9 F (36.6 C)-100 F (37.8 C)] 97.9 F (36.6 C) (07/24 0400) Pulse Rate:  [96-127] 100 (07/24 0600) Resp:  [21-32] 24 (07/24 0600) BP: (81-148)/(51-113) 133/84 mmHg (07/24 0600) SpO2:  [90 %-99 %] 96 % (07/24 0600) Weight:  [348 lb 15.8 oz (158.3 kg)] 348 lb 15.8 oz (158.3 kg) (07/24 0500)    Intake/Output from previous day: 07/23 0701 - 07/24 0700 In: 2040 [I.V.:300; NG/GT:90; IV Piggyback:1650] Out: 5175 [Urine:4575; Emesis/NG output:600] Intake/Output this shift: Total I/O In: -  Out: 150 [Urine:150]  PE: Abd: soft, few bowel sounds, morbidly obese, wound remains clean and packed. Heart: Regular but slightly tachycardic Lungs: Clear to auscultation bilaterally with anterior auscultation  Lab Results:   Recent Labs  12/16/13 0225 12/17/13 0500  WBC 23.3* 23.3*  HGB 12.2* 12.8*  HCT 37.6* 39.1  PLT 227 267   BMET  Recent Labs  12/16/13 0225 12/17/13 0500  NA 136* 138  K 3.9 4.0  CL 99 98  CO2 26 26  GLUCOSE 128* 120*  BUN 8 12  CREATININE 0.84 0.93  CALCIUM 8.0* 8.5   PT/INR No results found for this basename: LABPROT, INR,  in the last 72 hours CMP     Component Value Date/Time   NA 138 12/17/2013 0500   K 4.0 12/17/2013 0500   CL 98 12/17/2013 0500   CO2 26 12/17/2013 0500   GLUCOSE 120* 12/17/2013 0500   BUN 12 12/17/2013 0500   CREATININE 0.93 12/17/2013 0500   CALCIUM 8.5 12/17/2013 0500   PROT 8.2 12/10/2013 1917   ALBUMIN 3.8 12/10/2013 1917   AST 23 12/10/2013 1917   ALT 32 12/10/2013 1917   ALKPHOS 66 12/10/2013 1917   BILITOT 0.7 12/10/2013 1917   GFRNONAA >90 12/17/2013 0500   GFRAA >90 12/17/2013 0500   Lipase     Component Value Date/Time   LIPASE 20  12/10/2013 1917       Studies/Results: Dg Chest Port 1 View  12/16/2013   CLINICAL DATA:  Atelectasis. Exploratory laparotomy and ventral hernia repair.  EXAM: PORTABLE CHEST - 1 VIEW  COMPARISON:  12/14/2013  FINDINGS: Nasogastric tube is again noted with tip over the stomach in the left upper quadrant lungs are hypoinflated with mild bilateral linear perihilar density suggesting atelectasis. No evidence of effusion or pneumothorax. Cardiomediastinal silhouette and remainder of the exam is unchanged.  IMPRESSION: Persistent bilateral perihilar linear density suggesting atelectasis.  Nasogastric tube with tip over the stomach in the left upper quadrant.   Electronically Signed   By: Elberta Fortis M.D.   On: 12/16/2013 08:03   Dg Abd 2 Views  12/16/2013   CLINICAL DATA:  Abdominal pain. Shortness of breath. Assessment of ileus.  EXAM: ABDOMEN - 2 VIEW  COMPARISON:  12/13/2013  FINDINGS: Nasogastric tube projects over the stomach. Dextroconvex lumbar scoliosis. 8 mm right kidney lower pole renal calculus. Several air-fluid levels are present in large and small bowel. Small bowel loops are dilated up to 5 cm. This is similar to the prior exam in magnitude, although more loops appear gas-filled today.  IMPRESSION: 1. Moderately dilated small bowel, similar to prior although more loops are gas filled, with several air-fluid levels. Abnormal but  nonspecific appearance which could reflect ileus or obstruction. Correlate with bowel sounds and return of postoperative bowel function.   Electronically Signed   By: Herbie Baltimore M.D.   On: 12/16/2013 09:38    Anti-infectives: Anti-infectives   Start     Dose/Rate Route Frequency Ordered Stop   12/16/13 2200  vancomycin (VANCOCIN) 1,500 mg in sodium chloride 0.9 % 500 mL IVPB     1,500 mg 250 mL/hr over 120 Minutes Intravenous Every 12 hours 12/16/13 0841     12/16/13 0930  vancomycin (VANCOCIN) 2,500 mg in sodium chloride 0.9 % 500 mL IVPB     2,500  mg 250 mL/hr over 120 Minutes Intravenous  Once 12/16/13 0841 12/16/13 1159   12/14/13 0000  piperacillin-tazobactam (ZOSYN) IVPB 3.375 g     3.375 g 12.5 mL/hr over 240 Minutes Intravenous Every 8 hours 12/13/13 2352     12/13/13 1915  [MAR Hold]  piperacillin-tazobactam (ZOSYN) IVPB 3.375 g  Status:  Discontinued     (On MAR Hold since 12/13/13 2049)   3.375 g 100 mL/hr over 30 Minutes Intravenous 4 times per day 12/13/13 1913 12/13/13 2205   12/13/13 1745  clindamycin (CLEOCIN) IVPB 600 mg     600 mg 100 mL/hr over 30 Minutes Intravenous  Once 12/13/13 1738 12/13/13 1925       Assessment/Plan   1. POD 4, status post exploratory laparotomy with primary repair of ventral/umbilical hernia, without mesh, partial omentectomy  2. Ventilator dependent respiratory failure, s/p extubation  3. Morbid obesity  4. History of bilateral pulmonary emboli  5. Obstructive sleep apnea  6. History of DVT  7. Diabetes mellitus  8. Hyperkalemia, resolved  9. Leukocytosis, stable at 23K  10. Fever  Plan: 1. The patient actually looks pretty good today; however, his white blood cell count is still elevated at 23,000. His fever curve has improved with the highest temp at 100. 2. Continue vancomycin and Zosyn. He should get at least a total of 7 days of Zosyn for his intra-abdominal infection. 3. Continue normal saline wet to dry dressing changes to abdominal wound. 4. We will clamp his NG tube today as he is passing some flatus. Hopefully we can get this out soon. 5. Further pulmonary recommendations will be deferred to the pulmonologist. We greatly appreciate your assistance. 6. The patient is surgically stable for transfer out of the ICU. We will await recommendations from the critical care team.  LOS: 4 days    Jamar Casagrande E 12/17/2013, 9:00 AM Pager: [phone]

## 2013-12-17 NOTE — Progress Notes (Signed)
Rt Note: CPAP ordered to start after NGT dc'd. I spoke with RN and he states it is still in at this time Rt will continue to monitor.

## 2013-12-18 ENCOUNTER — Inpatient Hospital Stay (HOSPITAL_COMMUNITY): Payer: PRIVATE HEALTH INSURANCE

## 2013-12-18 LAB — CBC
HCT: 39.1 % (ref 39.0–52.0)
Hemoglobin: 12.8 g/dL — ABNORMAL LOW (ref 13.0–17.0)
MCH: 29.4 pg (ref 26.0–34.0)
MCHC: 32.7 g/dL (ref 30.0–36.0)
MCV: 89.9 fL (ref 78.0–100.0)
Platelets: 296 10*3/uL (ref 150–400)
RBC: 4.35 MIL/uL (ref 4.22–5.81)
RDW: 14.5 % (ref 11.5–15.5)
WBC: 17.7 10*3/uL — ABNORMAL HIGH (ref 4.0–10.5)

## 2013-12-18 LAB — VANCOMYCIN, TROUGH: Vancomycin Tr: 10.1 ug/mL (ref 10.0–20.0)

## 2013-12-18 LAB — GLUCOSE, CAPILLARY
Glucose-Capillary: 111 mg/dL — ABNORMAL HIGH (ref 70–99)
Glucose-Capillary: 118 mg/dL — ABNORMAL HIGH (ref 70–99)
Glucose-Capillary: 121 mg/dL — ABNORMAL HIGH (ref 70–99)
Glucose-Capillary: 123 mg/dL — ABNORMAL HIGH (ref 70–99)
Glucose-Capillary: 134 mg/dL — ABNORMAL HIGH (ref 70–99)
Glucose-Capillary: 136 mg/dL — ABNORMAL HIGH (ref 70–99)
Glucose-Capillary: 93 mg/dL (ref 70–99)

## 2013-12-18 MED ORDER — ASPIRIN EC 81 MG PO TBEC
81.0000 mg | DELAYED_RELEASE_TABLET | Freq: Every day | ORAL | Status: DC
  Administered 2013-12-18 – 2013-12-20 (×3): 81 mg via ORAL
  Filled 2013-12-18 (×3): qty 1

## 2013-12-18 MED ORDER — VANCOMYCIN HCL 10 G IV SOLR
1750.0000 mg | INTRAVENOUS | Status: AC
  Administered 2013-12-18: 1750 mg via INTRAVENOUS
  Filled 2013-12-18: qty 1750

## 2013-12-18 NOTE — Progress Notes (Signed)
Pt passed a large kidney stone this afternoon, saved it in urine specimen cup.

## 2013-12-18 NOTE — Progress Notes (Signed)
5 Days Post-Op  Subjective: Passing lots of flatus but no stool.IV rate is cut back but he is voiding normally. Afebrile. Heart rate 100. SpO2 90-94% on 1 L nasal O2.  No increased work of breathing. Does have a productive cough.  Dressing recently changed by RN. Reportedly completely clean without sign of infection.  Objective: Vital signs in last 24 hours: Temp:  [97.6 F (36.4 C)-98.2 F (36.8 C)] 98 F (36.7 C) (07/25 0421) Pulse Rate:  [98-108] 100 (07/25 0421) Resp:  [20-31] 20 (07/25 0421) BP: (121-148)/(63-81) 130/81 mmHg (07/25 0421) SpO2:  [90 %-96 %] 90 % (07/25 0421) Weight:  [350 lb 8.5 oz (159 kg)-370 lb (167.831 kg)] 370 lb (167.831 kg) (07/25 0500) Last BM Date: 12/09/13  Intake/Output from previous day: 07/24 0701 - 07/25 0700 In: 600 [IV Piggyback:600] Out: 1607 [Urine:1235] Intake/Output this shift:    General appearance: morbidly obese. Lying in bed. Breathing comfortably. Minimal distress.  Mental status normal. Resp: clear anteriorly without wheeze or rhonchi. GI: obese. Soft. Nondistended. Hypoactive bowel sounds. Cellulitis resolving.  Wound clean per nursing staff.  Lab Results:  Results for orders placed during the hospital encounter of 12/13/13 (from the past 24 hour(s))  GLUCOSE, CAPILLARY     Status: None   Collection Time    12/17/13 12:04 PM      Result Value Ref Range   Glucose-Capillary 86  70 - 99 mg/dL  GLUCOSE, CAPILLARY     Status: None   Collection Time    12/17/13  4:33 PM      Result Value Ref Range   Glucose-Capillary 89  70 - 99 mg/dL  GLUCOSE, CAPILLARY     Status: Abnormal   Collection Time    12/17/13  7:46 PM      Result Value Ref Range   Glucose-Capillary 106 (*) 70 - 99 mg/dL  GLUCOSE, CAPILLARY     Status: Abnormal   Collection Time    12/18/13 12:15 AM      Result Value Ref Range   Glucose-Capillary 136 (*) 70 - 99 mg/dL  GLUCOSE, CAPILLARY     Status: Abnormal   Collection Time    12/18/13  4:22 AM   Result Value Ref Range   Glucose-Capillary 121 (*) 70 - 99 mg/dL  GLUCOSE, CAPILLARY     Status: Abnormal   Collection Time    12/18/13  8:19 AM      Result Value Ref Range   Glucose-Capillary 111 (*) 70 - 99 mg/dL     Studies/Results: No results found.  Marland Kitchen antiseptic oral rinse  15 mL Mouth Rinse q12n4p  . chlorhexidine  15 mL Mouth Rinse BID  . enoxaparin (LOVENOX) injection  0.5 mg/kg Subcutaneous Q24H  . insulin aspart  0-20 Units Subcutaneous 6 times per day  . insulin detemir  15 Units Subcutaneous QHS  . pantoprazole (PROTONIX) IV  40 mg Intravenous QHS  . piperacillin-tazobactam (ZOSYN)  IV  3.375 g Intravenous Q8H  . vancomycin  1,500 mg Intravenous Q12H     Assessment/Plan: s/p Procedure(s): EXPLORATORY LAPAROTOMY Repair ventral hernia, without mesh, partial omentectomy  1. POD 5, status post exploratory laparotomy with primary repair of ventral/umbilical hernia, without mesh, partial omentectomy  No clinical evidence of wound infection He looks better. Will discontinue NG tube and allow sips of clear liquids Physical therapy consult. Ambulate and mobilize  2. Ventilator dependent respiratory failure, s/p extubation  3. Morbid obesity  4. History of bilateral pulmonary emboli  5. Obstructive  sleep apnea. Appreciate CCM input. 6. History of DVT- Restart ASA. 7. Diabetes mellitus  8. Hyperkalemia, resolved  9. Leukocytosis. Etiology and significance unclear. Repeat labs tomorrow. Chest x-ray today. 10. Fever   @PROBHOSP @  LOS: 5 days    Eadie Repetto M 12/18/2013  . .prob

## 2013-12-18 NOTE — Progress Notes (Signed)
Placed patient on CPAP for the night via auto-mode with minimum pressure set at 5cm and maximum pressure set at 20cm  

## 2013-12-18 NOTE — Progress Notes (Signed)
ANTIBIOTIC CONSULT NOTE - FOLLOW UP  Pharmacy Consult for Vancomycin Indication: pneumonia  No Known Allergies  Patient Measurements: Height: 6\' 3"  (190.5 cm) Weight: 370 lb (167.831 kg) IBW/kg (Calculated) : 84.5 Adjusted Body Weight:   Vital Signs: Temp: 98 F (36.7 C) (07/25 0421) Temp src: Oral (07/25 0421) BP: 130/81 mmHg (07/25 0421) Pulse Rate: 100 (07/25 0421) Intake/Output from previous day: 07/24 0701 - 07/25 0700 In: 600 [IV Piggyback:600] Out: 1235 [Urine:1235] Intake/Output from this shift: Total I/O In: 496 [P.O.:496] Out: 400 [Urine:400]  Labs:  Recent Labs  12/16/13 0225 12/17/13 0500 12/18/13 0830  WBC 23.3* 23.3* 17.7*  HGB 12.2* 12.8* 12.8*  PLT 227 267 296  CREATININE 0.84 0.93  --    Estimated Creatinine Clearance: 151.3 ml/min (by C-G formula based on Cr of 0.93).  Recent Labs  12/18/13 0915  [place] 10.1     Microbiology: Recent Results (from the past 720 hour(s))  URINE CULTURE     Status: None   Collection Time    12/02/13  6:07 PM      Result Value Ref Range Status   Specimen Description URINE, CLEAN CATCH   Final   Special Requests NONE   Final   Culture  Setup Time     Final   Value: 12/03/2013 00:26     Performed at [place]     Final   Value: >=100,000 COLONIES/ML     Performed at [facility]   Culture     Final   Value: ESCHERICHIA COLI     Performed at [facility]   Report Status 12/05/2013 FINAL   Final   Organism ID, Bacteria ESCHERICHIA COLI   Final  URINE CULTURE     Status: None   Collection Time    12/10/13 11:12 PM      Result Value Ref Range Status   Specimen Description URINE, CATHETERIZED   Final   Special Requests NONE   Final   Culture  Setup Time     Final   Value: 12/11/2013 01:00     Performed at [place]     Final   Value: NO GROWTH     Performed at [facility]   Culture     Final   Value: NO GROWTH      Performed at [facility]   Report Status 12/12/2013 FINAL   Final  URINE CULTURE     Status: None   Collection Time    12/16/13  8:36 AM      Result Value Ref Range Status   Specimen Description URINE, CATHETERIZED   Final   Special Requests NONE   Final   Culture  Setup Time     Final   Value: 12/16/2013 13:32     Performed at [place]     Final   Value: NO GROWTH     Performed at [facility]   Culture     Final   Value: NO GROWTH     Performed at [facility]   Report Status 12/17/2013 FINAL   Final  CULTURE, BLOOD (ROUTINE X 2)     Status: None   Collection Time    12/16/13  9:40 AM      Result Value Ref Range Status   Specimen Description BLOOD LEFT ARM   Final   Special Requests BOTTLES DRAWN AEROBIC  ONLY 2CC   Final   Culture  Setup Time     Final   Value: 12/16/2013 17:39     Performed at [facility]   Culture     Final   Value:        BLOOD CULTURE RECEIVED NO GROWTH TO DATE CULTURE WILL BE HELD FOR 5 DAYS BEFORE ISSUING A FINAL NEGATIVE REPORT     Performed at [facility]   Report Status PENDING   Incomplete  CULTURE, BLOOD (ROUTINE X 2)     Status: None   Collection Time    12/16/13  9:55 AM      Result Value Ref Range Status   Specimen Description BLOOD RIGHT ARM   Final   Special Requests BOTTLES DRAWN AEROBIC AND ANAEROBIC 10CC   Final   Culture  Setup Time     Final   Value: 12/16/2013 17:39     Performed at [facility]   Culture     Final   Value:        BLOOD CULTURE RECEIVED NO GROWTH TO DATE CULTURE WILL BE HELD FOR 5 DAYS BEFORE ISSUING A FINAL NEGATIVE REPORT     Performed at [facility]   Report Status PENDING   Incomplete    Anti-infectives   Start     Dose/Rate Route Frequency Ordered Stop   12/18/13 1215  vancomycin (VANCOCIN) 1,750 mg in sodium chloride 0.9 % 500 mL IVPB     1,750 mg 250 mL/hr over 120 Minutes Intravenous NOW 12/18/13 1211 12/19/13 1215    12/16/13 2200  vancomycin (VANCOCIN) 1,500 mg in sodium chloride 0.9 % 500 mL IVPB  Status:  Discontinued     1,500 mg 250 mL/hr over 120 Minutes Intravenous Every 12 hours 12/16/13 0841 12/18/13 1211   12/16/13 0930  vancomycin (VANCOCIN) 2,500 mg in sodium chloride 0.9 % 500 mL IVPB     2,500 mg 250 mL/hr over 120 Minutes Intravenous  Once 12/16/13 0841 12/16/13 1159   12/14/13 0000  piperacillin-tazobactam (ZOSYN) IVPB 3.375 g     3.375 g 12.5 mL/hr over 240 Minutes Intravenous Every 8 hours 12/13/13 2352     12/13/13 1915  [MAR Hold]  piperacillin-tazobactam (ZOSYN) IVPB 3.375 g  Status:  Discontinued     (On MAR Hold since 12/13/13 2049)   3.375 g 100 mL/hr over 30 Minutes Intravenous 4 times per day 12/13/13 1913 12/13/13 2205   12/13/13 1745  clindamycin (CLEOCIN) IVPB 600 mg     600 mg 100 mL/hr over 30 Minutes Intravenous  Once 12/13/13 1738 12/13/13 1925      Assessment: 53yo morbidly obese male on Vancomycin for pneumonia.  AFeb.  WBC remain elevated, though improved.  Vancomycin trough low at 10.1 on 1500mg  IV q12.  Expect pt to continue to accumulate.  Blood cx are NTD  Goal of Therapy:  Vancomycin trough level 15-20 mcg/ml  Plan:  1-  Vancomycin 1750 mg IV q12, 1st dose stat 2-  F/U steady state level 3-  Watch renal function and clinical status  Gracy Bruins, PharmD Clinical Pharmacist [facility]

## 2013-12-18 NOTE — Progress Notes (Signed)
Pt had a BM

## 2013-12-19 ENCOUNTER — Inpatient Hospital Stay (HOSPITAL_COMMUNITY): Payer: PRIVATE HEALTH INSURANCE

## 2013-12-19 DIAGNOSIS — N200 Calculus of kidney: Secondary | ICD-10-CM

## 2013-12-19 DIAGNOSIS — M25469 Effusion, unspecified knee: Secondary | ICD-10-CM

## 2013-12-19 LAB — URINALYSIS, ROUTINE W REFLEX MICROSCOPIC
Glucose, UA: NEGATIVE mg/dL
Ketones, ur: NEGATIVE mg/dL
Leukocytes, UA: NEGATIVE
Nitrite: NEGATIVE
Protein, ur: NEGATIVE mg/dL
Specific Gravity, Urine: 1.025 (ref 1.005–1.030)
Urobilinogen, UA: 1 mg/dL (ref 0.0–1.0)
pH: 6 (ref 5.0–8.0)

## 2013-12-19 LAB — CBC WITH DIFFERENTIAL/PLATELET
Basophils Absolute: 0.1 10*3/uL (ref 0.0–0.1)
Basophils Relative: 0 % (ref 0–1)
Eosinophils Absolute: 0.5 10*3/uL (ref 0.0–0.7)
Eosinophils Relative: 4 % (ref 0–5)
HCT: 36.4 % — ABNORMAL LOW (ref 39.0–52.0)
Hemoglobin: 11.7 g/dL — ABNORMAL LOW (ref 13.0–17.0)
Lymphocytes Relative: 16 % (ref 12–46)
Lymphs Abs: 2.3 10*3/uL (ref 0.7–4.0)
MCH: 28.7 pg (ref 26.0–34.0)
MCHC: 32.1 g/dL (ref 30.0–36.0)
MCV: 89.2 fL (ref 78.0–100.0)
Monocytes Absolute: 1.6 10*3/uL — ABNORMAL HIGH (ref 0.1–1.0)
Monocytes Relative: 11 % (ref 3–12)
Neutro Abs: 9.4 10*3/uL — ABNORMAL HIGH (ref 1.7–7.7)
Neutrophils Relative %: 69 % (ref 43–77)
Platelets: 295 10*3/uL (ref 150–400)
RBC: 4.08 MIL/uL — ABNORMAL LOW (ref 4.22–5.81)
RDW: 14.5 % (ref 11.5–15.5)
WBC: 13.8 10*3/uL — ABNORMAL HIGH (ref 4.0–10.5)

## 2013-12-19 LAB — BASIC METABOLIC PANEL
Anion gap: 12 (ref 5–15)
BUN: 14 mg/dL (ref 6–23)
CO2: 25 mEq/L (ref 19–32)
Calcium: 8.4 mg/dL (ref 8.4–10.5)
Chloride: 100 mEq/L (ref 96–112)
Creatinine, Ser: 0.89 mg/dL (ref 0.50–1.35)
GFR calc Af Amer: >90 mL/min (ref >90)
GFR calc non Af Amer: >90 mL/min (ref >90)
Glucose, Bld: 109 mg/dL — ABNORMAL HIGH (ref 70–99)
Potassium: 4.1 mEq/L (ref 3.7–5.3)
Sodium: 137 mEq/L (ref 137–147)

## 2013-12-19 LAB — GLUCOSE, CAPILLARY
Glucose-Capillary: 108 mg/dL — ABNORMAL HIGH (ref 70–99)
Glucose-Capillary: 109 mg/dL — ABNORMAL HIGH (ref 70–99)
Glucose-Capillary: 112 mg/dL — ABNORMAL HIGH (ref 70–99)
Glucose-Capillary: 127 mg/dL — ABNORMAL HIGH (ref 70–99)
Glucose-Capillary: 129 mg/dL — ABNORMAL HIGH (ref 70–99)
Glucose-Capillary: 152 mg/dL — ABNORMAL HIGH (ref 70–99)

## 2013-12-19 LAB — URINE MICROSCOPIC-ADD ON

## 2013-12-19 NOTE — Progress Notes (Signed)
6 Days Post-Op  Subjective: Tolerating clear liquid diet. Hungry. Have a bowel movement. Ambulating with a walker.  Complaints of right knee swelling and pain for a few days.Denies history of gout or trauma.  He also states he passed a kidney stone and showed me the stone in a bottle. No prior history. Voiding without symptoms.  WBC count of 13,800. Afebrile. CXR - no infiltrate.  Objective: Vital signs in last 24 hours: Temp:  [98.1 F (36.7 C)-98.4 F (36.9 C)] 98.1 F (36.7 C) (07/26 0621) Pulse Rate:  [81-103] 81 (07/26 0621) Resp:  [17-20] 17 (07/26 0621) BP: (103-131)/(59-77) 131/77 mmHg (07/26 0621) SpO2:  [90 %-92 %] 92 % (07/26 0621) Weight:  [368 lb 13.3 oz (167.3 kg)] 368 lb 13.3 oz (167.3 kg) (07/26 0621) Last BM Date: 12/18/13  Intake/Output from previous day: 07/25 0701 - 07/26 0700 In: 2552 [P.O.:1792; I.V.:160; IV Piggyback:600] Out: 1501 [Urine:1450; Emesis/NG output:50; Stool:1] Intake/Output this shift: Total I/O In: 638 [P.O.:638] Out: -   General appearance: alert. Cooperative. Looks like he feels better. Mental status normal. GI: morbidly obese.  Cellulitis has resolved. Open wound below umbilicus clean. Well packed. Fascia intact. Extremities: right knee is noticeably more edematous than left. A little bit tender and stiff. No erythema.  Lab Results:  Results for orders placed during the hospital encounter of 12/13/13 (from the past 24 hour(s))  GLUCOSE, CAPILLARY     Status: Abnormal   Collection Time    12/18/13 12:05 PM      Result Value Ref Range   Glucose-Capillary 134 (*) 70 - 99 mg/dL  GLUCOSE, CAPILLARY     Status: None   Collection Time    12/18/13  3:58 PM      Result Value Ref Range   Glucose-Capillary 93  70 - 99 mg/dL  GLUCOSE, CAPILLARY     Status: Abnormal   Collection Time    12/18/13  8:03 PM      Result Value Ref Range   Glucose-Capillary 123 (*) 70 - 99 mg/dL  GLUCOSE, CAPILLARY     Status: Abnormal   Collection Time     12/18/13 11:49 PM      Result Value Ref Range   Glucose-Capillary 118 (*) 70 - 99 mg/dL  GLUCOSE, CAPILLARY     Status: Abnormal   Collection Time    12/19/13  3:32 AM      Result Value Ref Range   Glucose-Capillary 108 (*) 70 - 99 mg/dL  CBC WITH DIFFERENTIAL     Status: Abnormal   Collection Time    12/19/13  5:06 AM      Result Value Ref Range   WBC 13.8 (*) 4.0 - 10.5 K/uL   RBC 4.08 (*) 4.22 - 5.81 MIL/uL   Hemoglobin 11.7 (*) 13.0 - 17.0 g/dL   HCT 36.4 (*) 39.0 - 52.0 %   MCV 89.2  78.0 - 100.0 fL   MCH 28.7  26.0 - 34.0 pg   MCHC 32.1  30.0 - 36.0 g/dL   RDW 14.5  11.5 - 15.5 %   Platelets 295  150 - 400 K/uL   Neutrophils Relative % 69  43 - 77 %   Neutro Abs 9.4 (*) 1.7 - 7.7 K/uL   Lymphocytes Relative 16  12 - 46 %   Lymphs Abs 2.3  0.7 - 4.0 K/uL   Monocytes Relative 11  3 - 12 %   Monocytes Absolute 1.6 (*) 0.1 - 1.0 K/uL  Eosinophils Relative 4  0 - 5 %   Eosinophils Absolute 0.5  0.0 - 0.7 K/uL   Basophils Relative 0  0 - 1 %   Basophils Absolute 0.1  0.0 - 0.1 K/uL  BASIC METABOLIC PANEL     Status: Abnormal   Collection Time    12/19/13  5:06 AM      Result Value Ref Range   Sodium 137  137 - 147 mEq/L   Potassium 4.1  3.7 - 5.3 mEq/L   Chloride 100  96 - 112 mEq/L   CO2 25  19 - 32 mEq/L   Glucose, Bld 109 (*) 70 - 99 mg/dL   BUN 14  6 - 23 mg/dL   Creatinine, Ser 0.89  0.50 - 1.35 mg/dL   Calcium 8.4  8.4 - 10.5 mg/dL   GFR calc non Af Amer >90  >90 mL/min   GFR calc Af Amer >90  >90 mL/min   Anion gap 12  5 - 15  GLUCOSE, CAPILLARY     Status: Abnormal   Collection Time    12/19/13  8:00 AM      Result Value Ref Range   Glucose-Capillary 112 (*) 70 - 99 mg/dL     Studies/Results: Dg Chest 2 View  12/18/2013   CLINICAL DATA:  Followup atelectasis.  EXAM: CHEST  2 VIEW  COMPARISON:  Portable chest x-ray 12/16/2013, 12/14/2013. Two-view chest x-ray 03/22/2013.  FINDINGS: AP semi-erect and lateral views were obtained. Suboptimal inspiration  due to body habitus accounts for atelectasis in the lower lobes and lingula. Lungs otherwise clear. No pleural effusions. No pneumothorax. Cardiac silhouette upper normal in size for technique. Hilar and mediastinal contours unremarkable. Visualized bony thorax intact.  IMPRESSION: Suboptimal inspiration accounts for atelectasis in the lower lobes and lingula. No acute cardiopulmonary disease otherwise.   Electronically Signed   By: Evangeline Dakin M.D.   On: 12/18/2013 11:37    . antiseptic oral rinse  15 mL Mouth Rinse q12n4p  . aspirin EC  81 mg Oral Daily  . chlorhexidine  15 mL Mouth Rinse BID  . enoxaparin (LOVENOX) injection  0.5 mg/kg Subcutaneous Q24H  . insulin aspart  0-20 Units Subcutaneous 6 times per day  . insulin detemir  15 Units Subcutaneous QHS  . pantoprazole (PROTONIX) IV  40 mg Intravenous QHS  . piperacillin-tazobactam (ZOSYN)  IV  3.375 g Intravenous Q8H     Assessment/Plan: s/p Procedure(s): EXPLORATORY LAPAROTOMY Repair ventral hernia, without mesh, partial omentectomy  1. POD 6, status post exploratory laparotomy with primary repair of ventral/umbilical hernia, without mesh, partial omentectomy  No clinical evidence of wound infection  Advance diet Physical therapy working with patient. Ambulate and mobilize   2. Ventilator dependent respiratory failure, resolved 3. Morbid obesity  4. History of bilateral pulmonary emboli  5. Obstructive sleep apnea. CCM has signed off. Asymptomatic without increased work of brerathing 6. History of DVT- Restart ASA.  7. Diabetes mellitus  8. Hyperkalemia, resolved  9. Leukocytosis. Better. Continue Zosyn and Vanc. 10. Fever - resolved  11. Right knee swelling. Etiology unclear. Diagnostic right knee x-ray ordered for today. Consider orthopod to 12. Historical and physical evidence of passing a kidney stone. I ordered a urinalysis.  @PROBHOSP @  LOS: 6 days    Evan Leonard M 12/19/2013  . .prob

## 2013-12-20 LAB — GLUCOSE, CAPILLARY
Glucose-Capillary: 110 mg/dL — ABNORMAL HIGH (ref 70–99)
Glucose-Capillary: 118 mg/dL — ABNORMAL HIGH (ref 70–99)
Glucose-Capillary: 119 mg/dL — ABNORMAL HIGH (ref 70–99)
Glucose-Capillary: 108 mg/dL — ABNORMAL HIGH (ref 70–99)
Glucose-Capillary: 127 mg/dL — ABNORMAL HIGH (ref 70–99)

## 2013-12-20 MED ORDER — VANCOMYCIN HCL 10 G IV SOLR
1750.0000 mg | Freq: Two times a day (BID) | INTRAVENOUS | Status: AC
  Administered 2013-12-20 (×2): 1750 mg via INTRAVENOUS
  Filled 2013-12-20 (×2): qty 1750

## 2013-12-20 MED ORDER — INSULIN ASPART 100 UNIT/ML ~~LOC~~ SOLN
0.0000 [IU] | Freq: Three times a day (TID) | SUBCUTANEOUS | Status: DC
  Administered 2013-12-21: 3 [IU] via SUBCUTANEOUS

## 2013-12-20 MED ORDER — KETOROLAC TROMETHAMINE 15 MG/ML IJ SOLN
15.0000 mg | Freq: Four times a day (QID) | INTRAMUSCULAR | Status: DC | PRN
  Administered 2013-12-20 – 2013-12-22 (×4): 15 mg via INTRAVENOUS
  Filled 2013-12-20 (×4): qty 1

## 2013-12-20 MED ORDER — ASPIRIN EC 81 MG PO TBEC
81.0000 mg | DELAYED_RELEASE_TABLET | Freq: Two times a day (BID) | ORAL | Status: DC
  Administered 2013-12-20 – 2013-12-22 (×4): 81 mg via ORAL
  Filled 2013-12-20 (×5): qty 1

## 2013-12-20 NOTE — Progress Notes (Signed)
Placed patient on CPAP via auto-mode with minimum pressure set at 5cm and maximum pressure set at 20cm

## 2013-12-20 NOTE — Progress Notes (Signed)
He is doing great.  Had large BM today.  Home soon.  Evan Leonard. Dahlia Bailiff, MD, [place] [phone] [facility]

## 2013-12-20 NOTE — Progress Notes (Signed)
Patient ID: Evan Leonard, male   DOB: [birth year], 53 y.o.   MRN: [id number] 7 Days Post-Op  Subjective: Patient is feeling well today. He is tolerating a solid diet. He had a large bowel movement this morning. His pain is well controlled. He still complains of some right knee pain and swelling.  Objective: Vital signs in last 24 hours: Temp:  [97.6 F (36.4 C)-99.4 F (37.4 C)] 97.6 F (36.4 C) (07/27 0555) Pulse Rate:  [77-95] 77 (07/27 0555) Resp:  [17-18] 17 (07/27 0555) BP: (123-136)/(65-79) 136/78 mmHg (07/27 0555) SpO2:  [95 %-96 %] 95 % (07/27 0555) Weight:  [356 lb (161.481 kg)] 356 lb (161.481 kg) (07/27 0555) Last BM Date: 12/18/13  Intake/Output from previous day: 07/26 0701 - 07/27 0700 In: 1458 [P.O.:1458] Out: 1975 [Urine:1975] Intake/Output this shift: Total I/O In: 340 [P.O.:340] Out: 301 [Urine:300; Stool:1]  PE: Abd: Soft, appropriately tender, no further erythema noted. Wound is clean and packed. Extremities: Right knee with some edema and some tenderness with palpation but no erythema present  Lab Results:   Recent Labs  12/18/13 0830 12/19/13 0506  WBC 17.7* 13.8*  HGB 12.8* 11.7*  HCT 39.1 36.4*  PLT 296 295   BMET  Recent Labs  12/19/13 0506  NA 137  K 4.1  CL 100  CO2 25  GLUCOSE 109*  BUN 14  CREATININE 0.89  CALCIUM 8.4   PT/INR No results found for this basename: LABPROT, INR,  in the last 72 hours CMP     Component Value Date/Time   NA 137 12/19/2013 0506   K 4.1 12/19/2013 0506   CL 100 12/19/2013 0506   CO2 25 12/19/2013 0506   GLUCOSE 109* 12/19/2013 0506   BUN 14 12/19/2013 0506   CREATININE 0.89 12/19/2013 0506   CALCIUM 8.4 12/19/2013 0506   PROT 8.2 12/10/2013 1917   ALBUMIN 3.8 12/10/2013 1917   AST 23 12/10/2013 1917   ALT 32 12/10/2013 1917   ALKPHOS 66 12/10/2013 1917   BILITOT 0.7 12/10/2013 1917   GFRNONAA >90 12/19/2013 0506   GFRAA >90 12/19/2013 0506   Lipase     Component Value Date/Time   LIPASE 20 12/10/2013  1917       Studies/Results: Dg Knee Complete 4 Views Right  12/19/2013   CLINICAL DATA:  Pain and swelling  EXAM: RIGHT KNEE - COMPLETE 4+ VIEW  COMPARISON:  CT examination March 23, 2013  FINDINGS: Frontal, lateral, and bilateral oblique views were obtained. There is a frontal sizable joint effusion. No fracture or dislocation. There is moderate narrowing of the patellofemoral joint. There is mild narrowing medially. There is no appreciable erosive change.  IMPRESSION: Sizable joint effusion. Areas of osteoarthritic change. No fracture or dislocation.   Electronically Signed   By: Bretta Bang M.D.   On: 12/19/2013 10:58    Anti-infectives: Anti-infectives   Start     Dose/Rate Route Frequency Ordered Stop   12/20/13 0930  vancomycin (VANCOCIN) 1,750 mg in sodium chloride 0.9 % 500 mL IVPB     1,750 mg 250 mL/hr over 120 Minutes Intravenous Every 12 hours 12/20/13 0919 12/20/13 2359   12/18/13 1215  vancomycin (VANCOCIN) 1,750 mg in sodium chloride 0.9 % 500 mL IVPB     1,750 mg 250 mL/hr over 120 Minutes Intravenous NOW 12/18/13 1211 12/18/13 1449   12/16/13 2200  vancomycin (VANCOCIN) 1,500 mg in sodium chloride 0.9 % 500 mL IVPB  Status:  Discontinued     1,500 mg  250 mL/hr over 120 Minutes Intravenous Every 12 hours 12/16/13 0841 12/18/13 1211   12/16/13 0930  vancomycin (VANCOCIN) 2,500 mg in sodium chloride 0.9 % 500 mL IVPB     2,500 mg 250 mL/hr over 120 Minutes Intravenous  Once 12/16/13 0841 12/16/13 1159   12/14/13 0000  piperacillin-tazobactam (ZOSYN) IVPB 3.375 g     3.375 g 12.5 mL/hr over 240 Minutes Intravenous Every 8 hours 12/13/13 2352 12/20/13 2359   12/13/13 1915  [MAR Hold]  piperacillin-tazobactam (ZOSYN) IVPB 3.375 g  Status:  Discontinued     (On MAR Hold since 12/13/13 2049)   3.375 g 100 mL/hr over 30 Minutes Intravenous 4 times per day 12/13/13 1913 12/13/13 2205   12/13/13 1745  clindamycin (CLEOCIN) IVPB 600 mg     600 mg 100 mL/hr over 30  Minutes Intravenous  Once 12/13/13 1738 12/13/13 1925       Assessment/Plan   1. POD 7, status post exploratory laparotomy with primary repair of ventral/umbilical hernia, without mesh, partial omentectomy  2. Ventilator dependent respiratory failure, s/p extubation  3. Morbid obesity  4. History of bilateral pulmonary emboli, takes 81 mg of aspirin twice a day  5. Obstructive sleep apnea  6. History of DVT  7. Diabetes mellitus  8. Hyperkalemia, resolved  9. Leukocytosis, 13,000 10. Fever, resolved 11. Pneumonia 12. Right knee joint effusion and osteoarthritis  Plan: 1. Initially physical therapy recommended a long term acute care hospital. I think the patient has progressed. He will likely need a skilled nursing facility at discharge to help with rehabilitation as well as dressing care as he has no one at home to help him. I have consulted social work to look into this. 2. Will give the patient Toradol as well as ice for his right knee. It looks like he has an effusion possibly related to osteoarthritis. We will start with anti-inflammatory therapy and see if this improves. 3. We will discontinue vancomycin and Zosyn after today's doses. 4. Continue regular diet. 5. The patient should be stable for discharge within the next day. 6. Continue dressing changes to his abdominal wall wound.   LOS: 7 days    Dru Laurel E 12/20/2013, 11:29 AM Pager: [phone]

## 2013-12-20 NOTE — Progress Notes (Signed)
Physical Therapy Treatment Patient Details Name: Evan Leonard MRN: [id number] DOB: [birth year] Today's Date: 12/20/2013    History of Present Illness      PT Comments    Pt making very good progress with all mobility.  Note he continues to have increased pain in R knee.  Note swelling and knee is warm to touch.  Pt states no history of gout and did not note any sensitivity to light touch but does have tenderness to moderate palpation along lateral aspect of knee, as well as inferior to patella.  Pt states he is to have possible aspiration and labs of knee per MD this morning.    Follow Up Recommendations  LTACH     Equipment Recommendations  Rolling walker with 5" wheels (bariatric)    Recommendations for Other Services OT consult     Precautions / Restrictions Precautions Precautions: Fall Precaution Comments: morbid obesity, abdominal incision Restrictions Weight Bearing Restrictions: No    Mobility  Bed Mobility               General bed mobility comments: Pt already up in recliner when PT arrived.   Transfers Overall transfer level: Needs assistance Equipment used: Rolling walker (2 wheeled) Transfers: Sit to/from Stand Sit to Stand: Min guard         General transfer comment: Pt did very well pushing from chair to stand. Min/guard for safety and steadying and cues for increased forward trunk lean.   Ambulation/Gait Ambulation/Gait assistance: Min assist;Min guard Ambulation Distance (Feet): 175 Feet Assistive device: Rolling walker (2 wheeled) Gait Pattern/deviations: Step-through pattern;Trunk flexed;Wide base of support     General Gait Details: Requires cues for upright posture, correct sequencing and safety with RW, as he tends to push RW far ahead of him then take large steps past RW.    Stairs            Wheelchair Mobility    Modified Rankin (Stroke Patients Only)       Balance Overall balance assessment: Needs  assistance Sitting-balance support: Feet supported Sitting balance-Leahy Scale: Good     Standing balance support: During functional activity Standing balance-Leahy Scale: Fair                      Cognition Arousal/Alertness: Awake/alert Behavior During Therapy: WFL for tasks assessed/performed Overall Cognitive Status: Within Functional Limits for tasks assessed                      Exercises General Exercises - Lower Extremity Ankle Circles/Pumps: AROM;Both;10 reps;Seated Quad Sets: Strengthening;Both;10 reps;Seated Short Arc Quad:  (attempted but too painful in R knee) Heel Slides:  (attempted but too painful in R knee) Hip ABduction/ADduction: Strengthening;Right;10 reps;Seated Straight Leg Raises: AAROM;Strengthening;Both;10 reps;Seated    General Comments        Pertinent Vitals/Pain Pt with 5/10 pain in R knee during gait.  Applied ice pack at end of session to address pain control.     Home Living                      Prior Function            PT Goals (current goals can now be found in the care plan section) Acute Rehab PT Goals Patient Stated Goal: Return home PT Goal Formulation: With patient Time For Goal Achievement: 12/30/13 Potential to Achieve Goals: Good Progress towards PT goals: Progressing toward goals    Frequency  Min 3X/week    PT Plan Current plan remains appropriate    Co-evaluation             End of Session Equipment Utilized During Treatment: Gait belt Activity Tolerance: Patient tolerated treatment well Patient left: in chair;with call bell/phone within reach     Time: 1349-1425 PT Time Calculation (min): 36 min  Charges:  $Gait Training: 8-22 mins $Therapeutic Exercise: 8-22 mins                    G Codes:      Denice Bors 12/20/2013, 2:35 PM

## 2013-12-20 NOTE — Progress Notes (Signed)
Pt places self on/off cpap/ 

## 2013-12-21 ENCOUNTER — Ambulatory Visit: Payer: PRIVATE HEALTH INSURANCE | Admitting: Internal Medicine

## 2013-12-21 LAB — CBC
HCT: 36 % — ABNORMAL LOW (ref 39.0–52.0)
Hemoglobin: 11.8 g/dL — ABNORMAL LOW (ref 13.0–17.0)
MCH: 29.4 pg (ref 26.0–34.0)
MCHC: 32.8 g/dL (ref 30.0–36.0)
MCV: 89.8 fL (ref 78.0–100.0)
Platelets: 308 10*3/uL (ref 150–400)
RBC: 4.01 MIL/uL — ABNORMAL LOW (ref 4.22–5.81)
RDW: 14.3 % (ref 11.5–15.5)
WBC: 11.2 10*3/uL — ABNORMAL HIGH (ref 4.0–10.5)

## 2013-12-21 LAB — GLUCOSE, CAPILLARY
Glucose-Capillary: 117 mg/dL — ABNORMAL HIGH (ref 70–99)
Glucose-Capillary: 115 mg/dL — ABNORMAL HIGH (ref 70–99)
Glucose-Capillary: 122 mg/dL — ABNORMAL HIGH (ref 70–99)
Glucose-Capillary: 125 mg/dL — ABNORMAL HIGH (ref 70–99)

## 2013-12-21 MED ORDER — ENOXAPARIN SODIUM 80 MG/0.8ML ~~LOC~~ SOLN
0.5000 mg/kg | SUBCUTANEOUS | Status: DC
  Administered 2013-12-21: 80 mg via SUBCUTANEOUS
  Filled 2013-12-21: qty 0.8

## 2013-12-21 MED ORDER — OXYCODONE-ACETAMINOPHEN 5-325 MG PO TABS
1.0000 | ORAL_TABLET | ORAL | Status: DC | PRN
  Administered 2013-12-21: 1 via ORAL
  Administered 2013-12-22: 2 via ORAL
  Filled 2013-12-21: qty 1
  Filled 2013-12-21: qty 2

## 2013-12-21 MED ORDER — OXYCODONE-ACETAMINOPHEN 5-325 MG PO TABS: 1.0000 | ORAL_TABLET | ORAL | Status: DC | PRN

## 2013-12-21 NOTE — Discharge Instructions (Signed)
[facility]      [facility] °[phone] ° °OPEN ABDOMINAL SURGERY: POST OP INSTRUCTIONS ° °Always review your discharge instruction sheet given to you by the facility where your surgery was performed. ° °IF YOU HAVE DISABILITY OR FAMILY LEAVE FORMS, YOU MUST BRING THEM TO THE OFFICE FOR PROCESSING.  PLEASE DO NOT GIVE THEM TO YOUR DOCTOR. ° °1. A prescription for pain medication may be given to you upon discharge.  Take your pain medication as prescribed, if needed.  If narcotic pain medicine is not needed, then you may take acetaminophen (Tylenol) or ibuprofen (Advil) as needed. °2. Take your usually prescribed medications unless otherwise directed. °3. If you need a refill on your pain medication, please contact your pharmacy. They will contact our office to request authorization.  Prescriptions will not be filled after 5pm or on week-ends. °4. You should follow a light diet the first few days after arrival home, such as soup and crackers, pudding, etc.unless your doctor has advised otherwise. A high-fiber, low fat diet can be resumed as tolerated.   Be sure to include lots of fluids daily. Most patients will experience some swelling and bruising on the chest and neck area.  Ice packs will help.  Swelling and bruising can take several days to resolve °5. Most patients will experience some swelling and bruising in the area of the incision. Ice pack will help. Swelling and bruising can take several days to resolve..  °6. It is common to experience some constipation if taking pain medication after surgery.  Increasing fluid intake and taking a stool softener will usually help or prevent this problem from occurring.  A mild laxative (Milk of Magnesia or Miralax) should be taken according to package directions if there are no bowel movements after 48 hours. °7.  You may have steri-strips (small skin tapes) in place directly over the incision.  These strips should be left on the skin for 7-10 days.  If your  surgeon used skin glue on the incision, you may shower in 24 hours.  The glue will flake off over the next 2-3 weeks.  Any sutures or staples will be removed at the office during your follow-up visit. You may find that a light gauze bandage over your incision may keep your staples from being rubbed or pulled. You may shower and replace the bandage daily. °8. ACTIVITIES:  You may resume regular (light) daily activities beginning the next day--such as daily self-care, walking, climbing stairs--gradually increasing activities as tolerated.  You may have sexual intercourse when it is comfortable.  Refrain from any heavy lifting or straining until approved by your doctor. °a. You may drive when you no longer are taking prescription pain medication, you can comfortably wear a seatbelt, and you can safely maneuver your car and apply brakes °b. Return to Work: ___________________________________ °9. You should see your doctor in the office for a follow-up appointment approximately two weeks after your surgery.  Make sure that you call for this appointment within a day or two after you arrive home to insure a convenient appointment time. °OTHER INSTRUCTIONS:  °_____________________________________________________________ °_____________________________________________________________ ° °WHEN TO CALL YOUR DOCTOR: °1. Fever over 101.0 °2. Inability to urinate °3. Nausea and/or vomiting °4. Extreme swelling or bruising °5. Continued bleeding from incision. °6. Increased pain, redness, or drainage from the incision. °7. Difficulty swallowing or breathing °8. Muscle cramping or spasms. °9. Numbness or tingling in hands or feet or around lips. ° °The clinic staff is available to   answer your questions during regular business hours.  Please don’t hesitate to call and ask to speak to one of the nurses if you have concerns. ° °For further questions, please visit [email or web address] ° ° °Dressing Change °A dressing is a  material placed over wounds. It keeps the wound clean, dry, and protected from further injury. This provides an environment that favors wound healing.  °BEFORE YOU BEGIN °· Get your supplies together. Things you may need include: °¨ Saline solution. °¨ Flexible gauze dressing. °¨ Medicated cream. °¨ Tape. °¨ Gloves. °¨ Abdominal dressing pads. °¨ Gauze squares. °¨ Plastic bags. °· Take pain medicine 30 minutes before the dressing change if you need it. °· Take a shower before you do the first dressing change of the day. Use plastic wrap or a plastic bag to prevent the dressing from getting wet. °REMOVING YOUR OLD DRESSING  °· Wash your hands with soap and water. Dry your hands with a clean towel. °· Put on your gloves. °· Remove any tape. °· Carefully remove the old dressing. If the dressing sticks, you may dampen it with warm water to loosen it, or follow your caregiver's specific directions. °· Remove any gauze or packing tape that is in your wound. °· Take off your gloves. °· Put the gloves, tape, gauze, or any packing tape into a plastic bag. °CHANGING YOUR DRESSING °· Open the supplies. °· Take the cap off the saline solution. °· Open the gauze package so that the gauze remains on the inside of the package. °· Put on your gloves. °· Clean your wound as told by your caregiver. °· If you have been told to keep your wound dry, follow those instructions. °· Your caregiver may tell you to do one or more of the following: °¨ Pick up the gauze. Pour the saline solution over the gauze. Squeeze out the extra saline solution. °¨ Put medicated cream or other medicine on your wound if you have been told to do so. °¨ Put the solution soaked gauze only in your wound, not on the skin around it. °¨ Pack your wound loosely or as told by your caregiver. °¨ Put dry gauze on your wound. °¨ Put abdominal dressing pads over the dry gauze if your wet gauze soaks through. °· Tape the abdominal dressing pads in place so they will not  fall off. Do not wrap the tape completely around the affected part (arm, leg, abdomen). °· Wrap the dressing pads with a flexible gauze dressing to secure it in place. °· Take off your gloves. Put them in the plastic bag with the old dressing. Tie the bag shut and throw it away. °· Keep the dressing clean and dry until your next dressing change. °· Wash your hands. °SEEK MEDICAL CARE IF: °· Your skin around the wound looks red. °· Your wound feels more tender or sore. °· You see pus in the wound. °· Your wound smells bad. °· You have a fever. °· Your skin around the wound has a rash that itches and burns. °· You see black or yellow skin in your wound that was not there before. °· You feel nauseous, throw up, and feel very tired. °Document Released: 06/20/2004 Document Revised: 08/05/2011 Document Reviewed: 03/25/2011 °ExitCare® Patient Information ©2015 [employer]. This information is not intended to replace advice given to you by your health care provider. Make sure you discuss any questions you have with your health care provider. ° °

## 2013-12-21 NOTE — Progress Notes (Signed)
Pt places self on/off cpap.  Rt will continue to monitor. 

## 2013-12-21 NOTE — Progress Notes (Signed)
Patient ID: Evan Leonard, male   DOB: 1961/01/12, 53 y.o.   MRN: [id number] 8 Days Post-Op  Subjective: Patient's doing well. His knee pain is better. He is tolerating a regular diet. No abdominal complaints.  Objective: Vital signs in last 24 hours: Temp:  [98 F (36.7 C)-99 F (37.2 C)] 98 F (36.7 C) (07/28 0542) Pulse Rate:  [80-89] 80 (07/28 0542) Resp:  [18] 18 (07/28 0542) BP: (120-133)/(73-77) 133/77 mmHg (07/28 0542) SpO2:  [95 %-97 %] 96 % (07/28 0542) Weight:  [356 lb (161.481 kg)] 356 lb (161.481 kg) (07/28 0542) Last BM Date: 12/20/13  Intake/Output from previous day: 07/27 0701 - 07/28 0700 In: 2230 [P.O.:1380; IV Piggyback:850] Out: 2651 [Urine:2650; Stool:1] Intake/Output this shift: Total I/O In: 380 [P.O.:380] Out: 600 [Urine:600]  PE: Abd: Soft, wound is packed and cleaned. Active bowel sounds. Abdominal binder in place.  Lab Results:   Recent Labs  12/19/13 0506 12/21/13 0026  WBC 13.8* 11.2*  HGB 11.7* 11.8*  HCT 36.4* 36.0*  PLT 295 308   BMET  Recent Labs  12/19/13 0506  NA 137  K 4.1  CL 100  CO2 25  GLUCOSE 109*  BUN 14  CREATININE 0.89  CALCIUM 8.4   PT/INR No results found for this basename: LABPROT, INR,  in the last 72 hours CMP     Component Value Date/Time   NA 137 12/19/2013 0506   K 4.1 12/19/2013 0506   CL 100 12/19/2013 0506   CO2 25 12/19/2013 0506   GLUCOSE 109* 12/19/2013 0506   BUN 14 12/19/2013 0506   CREATININE 0.89 12/19/2013 0506   CALCIUM 8.4 12/19/2013 0506   PROT 8.2 12/10/2013 1917   ALBUMIN 3.8 12/10/2013 1917   AST 23 12/10/2013 1917   ALT 32 12/10/2013 1917   ALKPHOS 66 12/10/2013 1917   BILITOT 0.7 12/10/2013 1917   GFRNONAA >90 12/19/2013 0506   GFRAA >90 12/19/2013 0506   Lipase     Component Value Date/Time   LIPASE 20 12/10/2013 1917       Studies/Results: No results found.  Anti-infectives: Anti-infectives   Start     Dose/Rate Route Frequency Ordered Stop   12/20/13 0930  vancomycin  (VANCOCIN) 1,750 mg in sodium chloride 0.9 % 500 mL IVPB     1,750 mg 250 mL/hr over 120 Minutes Intravenous Every 12 hours 12/20/13 0919 12/21/13 0045   12/18/13 1215  vancomycin (VANCOCIN) 1,750 mg in sodium chloride 0.9 % 500 mL IVPB     1,750 mg 250 mL/hr over 120 Minutes Intravenous NOW 12/18/13 1211 12/18/13 1449   12/16/13 2200  vancomycin (VANCOCIN) 1,500 mg in sodium chloride 0.9 % 500 mL IVPB  Status:  Discontinued     1,500 mg 250 mL/hr over 120 Minutes Intravenous Every 12 hours 12/16/13 0841 12/18/13 1211   12/16/13 0930  vancomycin (VANCOCIN) 2,500 mg in sodium chloride 0.9 % 500 mL IVPB     2,500 mg 250 mL/hr over 120 Minutes Intravenous  Once 12/16/13 0841 12/16/13 1159   12/14/13 0000  piperacillin-tazobactam (ZOSYN) IVPB 3.375 g     3.375 g 12.5 mL/hr over 240 Minutes Intravenous Every 8 hours 12/13/13 2352 12/20/13 2012   12/13/13 1915  [MAR Hold]  piperacillin-tazobactam (ZOSYN) IVPB 3.375 g  Status:  Discontinued     (On MAR Hold since 12/13/13 2049)   3.375 g 100 mL/hr over 30 Minutes Intravenous 4 times per day 12/13/13 1913 12/13/13 2205   12/13/13 1745  clindamycin (CLEOCIN)  IVPB 600 mg     600 mg 100 mL/hr over 30 Minutes Intravenous  Once 12/13/13 1738 12/13/13 1925       Assessment/Plan   1. POD 8, status post exploratory laparotomy with primary repair of ventral/umbilical hernia, without mesh, partial omentectomy  2. Ventilator dependent respiratory failure, s/p extubation  3. Morbid obesity  4. History of bilateral pulmonary emboli, takes 81 mg of aspirin twice a day  5. Obstructive sleep apnea  6. History of DVT  7. Diabetes mellitus  8. Hyperkalemia, resolved  9. Leukocytosis, 13,000  10. Fever, resolved  11. Pneumonia  12. Right knee joint effusion and osteoarthritis  Plan: 1. Patient is surgically stable. Hopefully we can find a skilled nursing facility for him today. If this is possible, then we will plan for discharge today. 2. Followup has  been arranged as an outpatient.  LOS: 8 days    Wendall Isabell E 12/21/2013, 11:45 AM Pager: [phone]

## 2013-12-21 NOTE — Discharge Summary (Signed)
Patient ID: Evan Leonard MRN: [id number] DOB/AGE: 1961/04/10 53 y.o.  Admit date: 12/13/2013 Discharge date: 12/22/2013  Procedures:  EXPLORATORY LAPAROTOMY Repair ventral/umbilical hernia, without mesh, partial omentectomy by Dr. Judeth Horn on 12/13/2013  Consults: pulmonary/intensive care  Reason for Admission: 53 yo male with DM and morbid obesity presents with a long-standing umbilical hernia but recent worsening of the periumbilical tenderness. He developed redness in this area and was evaluated in the ED on 12/10/13. He underwent CT scan which showed a large umbilical hernia containing a loop of small bowel but no sign of obstruction. There were obvious signs of inflammation on the scan. At that time, his WBC was 17. He returns today with worsening abdominal pain, nausea, and subjective fever. The erythema has worsened significantly and the firmness and tenderness around his umbilicus has worsened significantly. WBC is now 22. Plain films show signs of obstruction.  Admission Diagnoses:  1. Incarcerated, possible strangulated umbilical hernia with a large amount of surrounding soft tissue infection 2. Morbid obesity 3. History of PE and DVT 4. OSA 5. Diabetes mellitus  Hospital Course: The patient was admitted and placed on Zosyn given the amount of surrounding cellulitis of this hernia. He was urgently taken to the operating room where he underwent a laparotomy with repair of an umbilical hernia without mesh and a partial omentectomy secondary to necrotic omentum. He tolerated this well but was left intubated secondary to his body habitus as well as his obstructive sleep apnea. Critical care medicine was consulted to help [place] manage his ventilator. He was able to be extubated on postoperative day 1. His white blood cell count initially decreased from 23,000-18,000. However over the next couple of days it began to increase again. His chest x-ray was concerning for a possible pneumonia. After  discussion of critical care medicine and vancomycin was added to his antibiotic regimen. He was kept in the ICU for several days monitoring his respiratory status. His NG tube from surgery was also kept in place secondary to a postoperative ileus. On postop day 4, he was stable for transfer out of the ICU to a floor. He began passing flatus the next day and his NG tube was able to be discontinued. His diet was then advanced as tolerated. On postoperative day 7 his antibiotics including his Zosyn and vancomycin were discontinued for completed course treatment. He was weaned off of oxygen and just left on CPAP at night for his obstructive sleep apnea. He did have a large wound from his surgery. His fascia is closed his skin is open. This received normal saline wet to dry dressings twice a day. This wound remained clean throughout his entire hospitalization.  The patient did develop a right knee effusion and knee pain. This was x-rayed. It was negative. He was started on anti-inflammatories and given ice every 4 hours. This did help his symptoms. Because of his body habitus and surgery as well as his open wound, he was not felt safe to go home by himself. Therefore after evaluation by physical therapy and occupational therapy a skilled nursing facility was felt to be the safest disposition for him. On postoperative day #9, the patient was stable for discharge.  He should have normal saline wet to dry dressing changes to his abdominal wound twice a day. He should wear his abdominal binder at all times. Continue anti-inflammatories and ice as needed for right knee pain.  Discharge Diagnoses:  Active Problems:   Umbilical hernia, incarcerated Diabetes mellitus Obstructive sleep apnea  History of pulmonary emboli and DVT Status post exploratory laparotomy with repair of umbilical hernia without mesh, partial omentectomy  Discharge Medications:   Medication List         aspirin EC 81 MG tablet  Take 81 mg  by mouth 2 (two) times daily.     metFORMIN 500 MG tablet  Commonly known as:  GLUCOPHAGE  Take 500 mg by mouth 2 (two) times daily with a meal.     multivitamin with minerals Tabs tablet  Take 1 tablet by mouth daily.     oxyCODONE-acetaminophen 5-325 MG per tablet  Commonly known as:  PERCOCET/ROXICET  Take 1 tablet by mouth every 6 (six) hours as needed for severe pain.     oxyCODONE-acetaminophen 5-325 MG per tablet  Commonly known as:  ROXICET  Take 1-2 tablets by mouth every 4 (four) hours as needed for severe pain.     pantoprazole 40 MG tablet  Commonly known as:  PROTONIX  Take 1 tablet (40 mg total) by mouth daily at 12 noon.     promethazine 25 MG tablet  Commonly known as:  PHENERGAN  Take 25 mg by mouth every 6 (six) hours as needed for nausea or vomiting.     sulfamethoxazole-trimethoprim 800-160 MG per tablet  Commonly known as:  BACTRIM DS  Take 2 tablets by mouth 2 (two) times daily.        Discharge Instructions: Follow-up Information   Follow up with Gwenyth Ober, MD On 12/31/2013. (Arrive at 3:15pm to check in and fill out paperwork, for a 3:30pm appointment)    Specialty:  General Surgery   Contact information:   [address], [place], [place] [address] [phone]       Signed: Coralie Keens 12/22/2013, 8:57 AM

## 2013-12-21 NOTE — Clinical Social Work Placement (Addendum)
Clinical Social Work Department CLINICAL SOCIAL WORK PLACEMENT NOTE 12/21/2013  Patient:  Evan Leonard, Evan Leonard  Account Number:  [id number] Admit date:  12/13/2013  Clinical Social Worker:  Delrae Sawyers  Date/time:  12/21/2013 11:47 AM  Clinical Social Work is seeking post-discharge placement for this patient at the following level of care:   [place]   (*CSW will update this form in Epic as items are completed)   12/21/2013  Patient/family provided with [place] Department of Clinical Social Work's list of facilities offering this level of care within the geographic area requested by the patient (or if unable, by the patient's family).  12/21/2013  Patient/family informed of their freedom to choose among providers that offer the needed level of care, that participate in Medicare, Medicaid or managed care program needed by the patient, have an available bed and are willing to accept the patient.  12/21/2013  Patient/family informed of [facility]' ownership interest in [facility], as well as of the fact that they are under no obligation to receive care at this facility.  PASARR submitted to EDS on 12/21/2013 PASARR number received on 12/21/2013  FL2 transmitted to all facilities in geographic area requested by pt/family on  12/21/2013 FL2 transmitted to all facilities within larger geographic area on   Patient informed that his/her managed care company has contracts with or will negotiate with  certain facilities, including the following:     Patient/family informed of bed offers received:  12/21/2013 Patient chooses bed at [facility] Physician recommends and patient chooses bed at    Patient to be transferred to [facility] on  12/22/2013 Patient to be transferred to facility by [facility] Patient and family notified of transfer on 12/22/2013 Name of family member notified:  Pt (alert and oriented x4) notified at bedside.  Pt reports no family to be notified.  The following physician request were entered in Epic:   Additional Comments:  Lubertha Sayres, MSW, [facility] Licensed Clinical Social Worker [phone] and [phone]

## 2013-12-21 NOTE — Progress Notes (Signed)
Awaiting placement  Kathryne Eriksson. Dahlia Bailiff, MD, [place] [phone] [facility] Surgery

## 2013-12-21 NOTE — Clinical Social Work Psychosocial (Signed)
Clinical Social Work Department BRIEF PSYCHOSOCIAL ASSESSMENT 12/21/2013  Patient:  Evan Leonard, Evan Leonard     Account Number:  [id number]     Admit date:  12/13/2013  Clinical Social Worker:  Delrae Sawyers  Date/Time:  12/21/2013 11:43 AM  Referred by:  Physician  Date Referred:  12/21/2013 Referred for  SNF Placement   Other Referral:   none.   Interview type:  Patient Other interview type:   none.    PSYCHOSOCIAL DATA Living Status:  ALONE Admitted from facility:   Level of care:   Primary support name:  Izell Dilkon Primary support relationship to patient:  FRIEND Degree of support available:   Strong support system.    CURRENT CONCERNS Current Concerns  Post-Acute Placement   Other Concerns:   none.    SOCIAL WORK ASSESSMENT / PLAN CSW consulted for possible SNF placement at time of discharge. CSW met with pt at bedside to discuss discharge disposition. Pt states he lives alone and will need SNF at time of discharge. Pt states he prefers [facility], but is open to other [facility] bed offers.    Pt states he has support in his community from friends and his landlord.    CSW to continue to follow and assist with discharge planning needs.   Assessment/plan status:  Psychosocial Support/Ongoing Assessment of Needs Other assessment/ plan:   none.   Information/referral to community resources:   [facility] bed offers.    PATIENT'S/FAMILY'S RESPONSE TO PLAN OF CARE: Pt is understanding and agreeable to CSW plan of care. Pt expressed no further questions or concerns at this time.       Lubertha Sayres, MSW, [facility] Licensed Clinical Social Worker [phone] and [phone]

## 2013-12-22 LAB — CULTURE, BLOOD (ROUTINE X 2)
Culture: NO GROWTH
Culture: NO GROWTH

## 2013-12-22 LAB — GLUCOSE, CAPILLARY
Glucose-Capillary: 116 mg/dL — ABNORMAL HIGH (ref 70–99)
Glucose-Capillary: 95 mg/dL (ref 70–99)

## 2013-12-22 MED ORDER — PANTOPRAZOLE SODIUM 40 MG PO TBEC
40.0000 mg | DELAYED_RELEASE_TABLET | Freq: Every day | ORAL | Status: DC
  Administered 2013-12-22: 40 mg via ORAL
  Filled 2013-12-22: qty 1

## 2013-12-22 MED ORDER — PANTOPRAZOLE SODIUM 40 MG PO TBEC: 40.0000 mg | DELAYED_RELEASE_TABLET | Freq: Every day | ORAL | Status: DC

## 2013-12-22 NOTE — Discharge Summary (Signed)
Okay to go to SNF.  Evan Leonard. Dahlia Bailiff, MD, [place] [phone] [facility] Surgery

## 2013-12-22 NOTE — Progress Notes (Signed)
[facility] Progress Note  9 Days Post-Op  Subjective: Pt doing well, having very little pain, tolerating diet.  Ambulating OOB.  Pending SNF today.  Objective: Vital signs in last 24 hours: Temp:  [97.8 F (36.6 C)-98.3 F (36.8 C)] 98.3 F (36.8 C) (07/29 0445) Pulse Rate:  [71-93] 71 (07/29 0445) Resp:  [18] 18 (07/29 0445) BP: (114-131)/(72-79) 130/76 mmHg (07/29 0445) SpO2:  [95 %-100 %] 98 % (07/29 0445) Last BM Date: 12/20/13  Intake/Output from previous day: 07/28 0701 - 07/29 0700 In: 1100 [P.O.:1100] Out: 2125 [Urine:2125] Intake/Output this shift:    PE: Gen:  Alert, NAD, pleasant Abd: Obese, soft, NT/ND, +BS, no HSM, low horizontal wound clean   Lab Results:   Recent Labs  12/21/13 0026  WBC 11.2*  HGB 11.8*  HCT 36.0*  PLT 308   BMET No results found for this basename: NA, K, CL, CO2, GLUCOSE, BUN, CREATININE, CALCIUM,  in the last 72 hours PT/INR No results found for this basename: LABPROT, INR,  in the last 72 hours CMP     Component Value Date/Time   NA 137 12/19/2013 0506   K 4.1 12/19/2013 0506   CL 100 12/19/2013 0506   CO2 25 12/19/2013 0506   GLUCOSE 109* 12/19/2013 0506   BUN 14 12/19/2013 0506   CREATININE 0.89 12/19/2013 0506   CALCIUM 8.4 12/19/2013 0506   PROT 8.2 12/10/2013 1917   ALBUMIN 3.8 12/10/2013 1917   AST 23 12/10/2013 1917   ALT 32 12/10/2013 1917   ALKPHOS 66 12/10/2013 1917   BILITOT 0.7 12/10/2013 1917   GFRNONAA >90 12/19/2013 0506   GFRAA >90 12/19/2013 0506   Lipase     Component Value Date/Time   LIPASE 20 12/10/2013 1917       Studies/Results: No results found.  Anti-infectives: Anti-infectives   Start     Dose/Rate Route Frequency Ordered Stop   12/20/13 0930  vancomycin (VANCOCIN) 1,750 mg in sodium chloride 0.9 % 500 mL IVPB     1,750 mg 250 mL/hr over 120 Minutes Intravenous Every 12 hours 12/20/13 0919 12/21/13 0045   12/18/13 1215  vancomycin (VANCOCIN) 1,750 mg in sodium chloride 0.9 %  500 mL IVPB     1,750 mg 250 mL/hr over 120 Minutes Intravenous NOW 12/18/13 1211 12/18/13 1449   12/16/13 2200  vancomycin (VANCOCIN) 1,500 mg in sodium chloride 0.9 % 500 mL IVPB  Status:  Discontinued     1,500 mg 250 mL/hr over 120 Minutes Intravenous Every 12 hours 12/16/13 0841 12/18/13 1211   12/16/13 0930  vancomycin (VANCOCIN) 2,500 mg in sodium chloride 0.9 % 500 mL IVPB     2,500 mg 250 mL/hr over 120 Minutes Intravenous  Once 12/16/13 0841 12/16/13 1159   12/14/13 0000  piperacillin-tazobactam (ZOSYN) IVPB 3.375 g     3.375 g 12.5 mL/hr over 240 Minutes Intravenous Every 8 hours 12/13/13 2352 12/20/13 2012   12/13/13 1915  [MAR Hold]  piperacillin-tazobactam (ZOSYN) IVPB 3.375 g  Status:  Discontinued     (On MAR Hold since 12/13/13 2049)   3.375 g 100 mL/hr over 30 Minutes Intravenous 4 times per day 12/13/13 1913 12/13/13 2205   12/13/13 1745  clindamycin (CLEOCIN) IVPB 600 mg     600 mg 100 mL/hr over 30 Minutes Intravenous  Once 12/13/13 1738 12/13/13 1925       Assessment/Plan 1. POD #9, status post exploratory laparotomy with primary repair of ventral/umbilical hernia, without mesh, partial omentectomy  2. Ventilator dependent respiratory failure, s/p extubation  3. Morbid obesity  4. History of bilateral pulmonary emboli, takes 81 mg of aspirin twice a day  5. Obstructive sleep apnea  6. History of DVT  7. Diabetes mellitus  8. Hyperkalemia, resolved  9. Leukocytosis, improved 10. Fever, resolved  11. Pneumonia  12. Right knee joint effusion and osteoarthritis   Plan:  1.  Patient is surgically stable. D/c today to SNF when bed available 2.  Follow-up has been arranged as an outpatient. 12/31/13 at 3:30pm with Dr. Hulen Skains 3.  Will need BID WD dressing changes to abdominal incisional wound    LOS: 9 days    DORT, Anisha Starliper 12/22/2013, 7:26 AM Pager: [phone]

## 2013-12-22 NOTE — Progress Notes (Signed)
Report called to [place] at [facility].

## 2013-12-22 NOTE — Clinical Social Work Note (Signed)
Discharge summary has been faxed to [facility]. Discharge packet is complete and placed on pt's shadow chart. CSW updated pt at bedside regarding discharge. Pt states understanding and agreeable to discharge disposition. Transportation has been arranged via EMS ([facility]).  RN to please call report to [place] at [phone].  Lubertha Sayres, MSW, [facility] Licensed Clinical Social Worker [phone] and [phone]

## 2013-12-28 ENCOUNTER — Non-Acute Institutional Stay (SKILLED_NURSING_FACILITY): Payer: PRIVATE HEALTH INSURANCE | Admitting: Internal Medicine

## 2013-12-28 ENCOUNTER — Encounter: Payer: Self-pay | Admitting: Internal Medicine

## 2013-12-28 DIAGNOSIS — Z6841 Body Mass Index (BMI) 40.0 and over, adult: Secondary | ICD-10-CM

## 2013-12-28 DIAGNOSIS — E1165 Type 2 diabetes mellitus with hyperglycemia: Secondary | ICD-10-CM

## 2013-12-28 DIAGNOSIS — G4733 Obstructive sleep apnea (adult) (pediatric): Secondary | ICD-10-CM

## 2013-12-28 DIAGNOSIS — K420 Umbilical hernia with obstruction, without gangrene: Secondary | ICD-10-CM

## 2013-12-28 DIAGNOSIS — I2699 Other pulmonary embolism without acute cor pulmonale: Secondary | ICD-10-CM

## 2013-12-28 DIAGNOSIS — M25569 Pain in unspecified knee: Secondary | ICD-10-CM

## 2013-12-28 DIAGNOSIS — J189 Pneumonia, unspecified organism: Secondary | ICD-10-CM | POA: Insufficient documentation

## 2013-12-28 DIAGNOSIS — IMO0001 Reserved for inherently not codable concepts without codable children: Secondary | ICD-10-CM

## 2013-12-28 DIAGNOSIS — M25561 Pain in right knee: Secondary | ICD-10-CM

## 2013-12-28 DIAGNOSIS — IMO0002 Reserved for concepts with insufficient information to code with codable children: Secondary | ICD-10-CM

## 2013-12-28 NOTE — Assessment & Plan Note (Addendum)
The patient was admitted and placed on Zosyn given the amount of surrounding cellulitis of this hernia. He was urgently taken to the operating room where he underwent a laparotomy with repair of an umbilical hernia without mesh and a partial omentectomy secondary to necrotic omentum. He tolerated this well but was left intubated secondary to his body habitus as well as his obstructive sleep apneaHis NG tube from surgery was also kept in place secondary to a postoperative ileus. On postop day 4, he was stable for transfer out of the ICU  He did have a large wound from his surgery. His fascia is closed his skin is open. This received normal saline wet to dry dressings twice a day. This wound remained clean throughout his entire hospitalization.

## 2013-12-28 NOTE — Assessment & Plan Note (Signed)
CPAP.  

## 2013-12-28 NOTE — Assessment & Plan Note (Signed)
Open skin wound and CPAP

## 2013-12-28 NOTE — Assessment & Plan Note (Signed)
A1c 6.8 on metformin 500 mg BID

## 2013-12-28 NOTE — Assessment & Plan Note (Signed)
His chest x-ray was concerning for a possible pneumonia. After discussion of critical care medicine and vancomycin was added to his antibiotic regimen. He was kept in the ICU for several days monitoring his respiratory status.

## 2013-12-28 NOTE — Assessment & Plan Note (Signed)
Noted;on ASA 81 mg only

## 2013-12-28 NOTE — Assessment & Plan Note (Signed)
This was x-rayed. It was negative. He was started on anti-inflammatories and given ice every 4 hours. This did help his sx

## 2013-12-28 NOTE — Progress Notes (Signed)
MRN: [id number] Name: Evan Leonard  Sex: male Age: 53 y.o. DOB: 1960/10/19  [place] #: Karren Burly Facility/Room: 132A Level Of Care: SNF Provider: Inocencio Homes D Emergency Contacts: Extended Emergency Contact Information Primary Emergency Contact: Reed,Donna Address: [place] Mobile Phone: [phone] Relation: Friend  Code Status: FULL  Allergies: Review of patient's allergies indicates no known allergies.  Chief Complaint  Patient presents with  . nursing home admission    HPI: Patient is 53 y.o. male who is admitted for wound care and OT/PT after surgery for incarcerated umbilical hernia complicated by massive obesity.  Past Medical History  Diagnosis Date  . Obesity   . Pulmonary embolism 05/04/2012    bilaterally  . Exertional dyspnea 05/04/2012    "isolated episode" (05/05/2012)  . Sleep apnea     "pretty sure; never been tested" (05/05/2012)  . History of chickenpox   . DVT (deep venous thrombosis)   . Diabetes mellitus, type II 02/2013    Past Surgical History  Procedure Laterality Date  . No past surgeries    . Laparotomy N/A 12/13/2013    Procedure: EXPLORATORY LAPAROTOMY Repair ventral hernia, without mesh, partial omentectomy;  Surgeon: Gwenyth Ober, MD;  Location: [place];  Service: General;  Laterality: N/A;      Medication List       This list is accurate as of: 12/28/13  7:06 PM.  Always use your most recent med list.               aspirin EC 81 MG tablet  Take 81 mg by mouth 2 (two) times daily.     metFORMIN 500 MG tablet  Commonly known as:  GLUCOPHAGE  Take 500 mg by mouth 2 (two) times daily with a meal.     multivitamin with minerals Tabs tablet  Take 1 tablet by mouth daily.     oxyCODONE-acetaminophen 5-325 MG per tablet  Commonly known as:  PERCOCET/ROXICET  Take 1 tablet by mouth every 6 (six) hours as needed for severe pain.     oxyCODONE-acetaminophen 5-325 MG per tablet  Commonly known  as:  ROXICET  Take 1-2 tablets by mouth every 4 (four) hours as needed for severe pain.     pantoprazole 40 MG tablet  Commonly known as:  PROTONIX  Take 1 tablet (40 mg total) by mouth daily at 12 noon.     promethazine 25 MG tablet  Commonly known as:  PHENERGAN  Take 25 mg by mouth every 6 (six) hours as needed for nausea or vomiting.     sulfamethoxazole-trimethoprim 800-160 MG per tablet  Commonly known as:  BACTRIM DS  Take 2 tablets by mouth 2 (two) times daily.        No orders of the defined types were placed in this encounter.    Immunization History  Administered Date(s) Administered  . Influenza Split 05/27/2012  . Influenza,inj,Quad PF,36+ Mos 02/15/2013  . Pneumococcal Polysaccharide-23 03/31/2013    History  Substance Use Topics  . Smoking status: Never Smoker   . Smokeless tobacco: Never Used     Comment: never used tobacco  . Alcohol Use: No    Family history is noncontributory    Review of Systems  DATA OBTAINED: from patient GENERAL: Feels well no fevers, fatigue, appetite changes SKIN: No itching, rash or wounds EYES: No eye pain, redness, discharge EARS: No earache, tinnitus, change in hearing NOSE: No congestion, drainage or bleeding  MOUTH/THROAT: No mouth  or tooth pain, No sore throat, No difficulty chewing or swallowing  RESPIRATORY: No cough, wheezing, SOB CARDIAC: No chest pain, palpitations, lower extremity edema  GI: No abdominal pain, No N/V/D or constipation, No heartburn or reflux  GU: No dysuria, frequency or urgency, or incontinence  MUSCULOSKELETAL: No unrelieved bone/joint pain NEUROLOGIC: No headache, dizziness or focal weakness PSYCHIATRIC: No overt anxiety or sadness. Sleeps well. No behavior issue.   Filed Vitals:   12/28/13 1850  BP: 126/74  Pulse: 83  Temp: 97.8 F (36.6 C)  Resp: 20    Physical Exam  GENERAL APPEARANCE: Alert, conversant. Appropriately groomed. No acute distress; extremely obese SKIN: No  diaphoresis rash; abd wound dressed;no heat palpated HEAD: Normocephalic, atraumatic  EYES: Conjunctiva/lids clear. Pupils round, reactive. EOMs intact.  EARS: External exam WNL, canals clear. Hearing grossly normal.  NOSE: No deformity or discharge.  MOUTH/THROAT: Lips w/o lesions  RESPIRATORY: Breathing is even, unlabored. Lung sounds are clear   CARDIOVASCULAR: Heart RRR no murmurs, rubs or gallops. No peripheral edema.   GASTROINTESTINAL: Abdomen is soft, non-tender, not distended w/ normal bowel sounds GENITOURINARY: Bladder non tender, not distended  MUSCULOSKELETAL: R knee with mild swelling and minimal heat NEUROLOGIC: Oriented X3. Cranial nerves 2-12 grossly intact. Moves all extremities no tremor. PSYCHIATRIC: Mood and affect appropriate to situation, no behavioral issues  Patient Active Problem List   Diagnosis Date Noted  . HCAP (healthcare-associated pneumonia) 12/28/2013  . Knee pain, right 12/28/2013  . Umbilical hernia, incarcerated 12/13/2013  . Type II or unspecified type diabetes mellitus without mention of complication, uncontrolled 03/31/2013  . Cellulitis and abscess of leg 03/23/2013  . Leukocytosis 03/23/2013  . Hyponatremia 03/23/2013  . Hematuria 03/23/2013  . Pulmonary embolism, hx of 05/05/2012  . Morbid obesity with body mass index of 40.0-44.9 in adult 05/05/2012  . Obstructive sleep apnea 05/05/2012  . DVT of left distal popliteal vein 05/05/2012    CBC    Component Value Date/Time   WBC 11.2* 12/21/2013 0026   WBC 9.2 08/05/2013 1502   RBC 4.01* 12/21/2013 0026   RBC 4.88 08/05/2013 1502   HGB 11.8* 12/21/2013 0026   HGB 14.0 08/05/2013 1502   HCT 36.0* 12/21/2013 0026   HCT 42.6 08/05/2013 1502   PLT 308 12/21/2013 0026   PLT 194 08/05/2013 1502   MCV 89.8 12/21/2013 0026   MCV 87 08/05/2013 1502   LYMPHSABS 2.3 12/19/2013 0506   LYMPHSABS 2.9 08/05/2013 1502   MONOABS 1.6* 12/19/2013 0506   EOSABS 0.5 12/19/2013 0506   EOSABS 0.4 08/05/2013 1502    BASOSABS 0.1 12/19/2013 0506   BASOSABS 0.0 08/05/2013 1502    CMP     Component Value Date/Time   NA 137 12/19/2013 0506   K 4.1 12/19/2013 0506   CL 100 12/19/2013 0506   CO2 25 12/19/2013 0506   GLUCOSE 109* 12/19/2013 0506   BUN 14 12/19/2013 0506   CREATININE 0.89 12/19/2013 0506   CALCIUM 8.4 12/19/2013 0506   PROT 8.2 12/10/2013 1917   ALBUMIN 3.8 12/10/2013 1917   AST 23 12/10/2013 1917   ALT 32 12/10/2013 1917   ALKPHOS 66 12/10/2013 1917   BILITOT 0.7 12/10/2013 1917   GFRNONAA >90 12/19/2013 0506   GFRAA >90 12/19/2013 0506    Assessment and Plan  Umbilical hernia, incarcerated The patient was admitted and placed on Zosyn given the amount of surrounding cellulitis of this hernia. He was urgently taken to the operating room where he underwent a laparotomy with  repair of an umbilical hernia without mesh and a partial omentectomy secondary to necrotic omentum. He tolerated this well but was left intubated secondary to his body habitus as well as his obstructive sleep apneaHis NG tube from surgery was also kept in place secondary to a postoperative ileus. On postop day 4, he was stable for transfer out of the ICU  He did have a large wound from his surgery. His fascia is closed his skin is open. This received normal saline wet to dry dressings twice a day. This wound remained clean throughout his entire hospitalization.    HCAP (healthcare-associated pneumonia) His chest x-ray was concerning for a possible pneumonia. After discussion of critical care medicine and vancomycin was added to his antibiotic regimen. He was kept in the ICU for several days monitoring his respiratory status.    Morbid obesity with body mass index of 40.0-44.9 in adult Open skin wound and CPAP  Knee pain, right This was x-rayed. It was negative. He was started on anti-inflammatories and given ice every 4 hours. This did help his sx   Obstructive sleep apnea CPAP  Type II or unspecified type diabetes  mellitus without mention of complication, uncontrolled A1c 6.8 on metformin 500 mg BID  Pulmonary embolism, hx of Noted;on ASA 81 mg only    Hennie Duos, MD

## 2013-12-31 ENCOUNTER — Ambulatory Visit (INDEPENDENT_AMBULATORY_CARE_PROVIDER_SITE_OTHER): Payer: PRIVATE HEALTH INSURANCE | Admitting: General Surgery

## 2013-12-31 ENCOUNTER — Encounter (INDEPENDENT_AMBULATORY_CARE_PROVIDER_SITE_OTHER): Payer: Self-pay | Admitting: General Surgery

## 2013-12-31 VITALS — BP 136/80 | HR 82 | Temp 98.3°F | Ht 74.0 in | Wt 347.0 lb

## 2013-12-31 DIAGNOSIS — Z09 Encounter for follow-up examination after completed treatment for conditions other than malignant neoplasm: Secondary | ICD-10-CM

## 2013-12-31 NOTE — Progress Notes (Signed)
Subjective:     Patient ID: Evan Leonard, male   DOB: 12/09/1960, 53 y.o.   MRN: [id number]  HPI The patient comes in status post emergency incarcerated umbilical hernia repair with necrotic omentum. He is doing well at the skilled nursing facility. He's had no fevers or chills and is gaining his strength much better.  Review of Systems No fevers or chills.    Objective:   Physical Exam On examination today the patient's wound is healing very well on the lateral aspect and centrally. Very deep approximately 5-6 cm deep down to the fashion there is an open portion that probably keep this wound from excepting a negative pressure wound dressing. The fascia was exposed but completely intact with noticeable suture material. A wet-to-dry dressing with saline soaked Kerlix gauze was packed into the wound. This should continue as previously prescribed.    Assessment:     Healing wound status post emergency open umbilical hernia repair with incarcerated necrotic omentum.     Plan:     Continue dressing changes as previously prescribed. Return to see me in approximately one month. Wound is not ready for negative pressure wound dressing currently.

## 2014-01-04 ENCOUNTER — Telehealth (INDEPENDENT_AMBULATORY_CARE_PROVIDER_SITE_OTHER): Payer: Self-pay

## 2014-01-04 NOTE — Telephone Encounter (Signed)
Message copied by Carlene Coria on Tue Jan 04, 2014 11:56 AM ------      Message from: Tami Lin      Created: Fri Dec 31, 2013  4:50 PM      Regarding: Dr Raeanne Barry: [phone]       Need 4 wk f/u around 01/28/14 ------

## 2014-01-04 NOTE — Telephone Encounter (Signed)
LMOM with appt info for 9/14 at 3:00 with Dr Hulen Skains

## 2014-01-18 ENCOUNTER — Non-Acute Institutional Stay (SKILLED_NURSING_FACILITY): Payer: PRIVATE HEALTH INSURANCE | Admitting: Internal Medicine

## 2014-01-18 ENCOUNTER — Encounter: Payer: Self-pay | Admitting: Internal Medicine

## 2014-01-18 DIAGNOSIS — E1165 Type 2 diabetes mellitus with hyperglycemia: Secondary | ICD-10-CM

## 2014-01-18 DIAGNOSIS — Z6841 Body Mass Index (BMI) 40.0 and over, adult: Secondary | ICD-10-CM

## 2014-01-18 DIAGNOSIS — I2699 Other pulmonary embolism without acute cor pulmonale: Secondary | ICD-10-CM

## 2014-01-18 DIAGNOSIS — G4733 Obstructive sleep apnea (adult) (pediatric): Secondary | ICD-10-CM

## 2014-01-18 DIAGNOSIS — IMO0001 Reserved for inherently not codable concepts without codable children: Secondary | ICD-10-CM

## 2014-01-18 DIAGNOSIS — K420 Umbilical hernia with obstruction, without gangrene: Secondary | ICD-10-CM

## 2014-01-18 NOTE — Progress Notes (Signed)
MRN: [id number] Name: Evan Leonard  Sex: male Age: 52 y.o. DOB: 07/07/1960  [place] #:  Facility/Room: Level Of Care: SNF Provider: Inocencio Homes D Emergency Contacts: Extended Emergency Contact Information Primary Emergency Contact: Reed,Donna Address: [place] Mobile Phone: [phone] Relation: Friend Secondary Emergency Contact: Reed,Derick  [place] Phone: [phone] Relation: Other  Code Status:   Allergies: Review of patient's allergies indicates no known allergies.  No chief complaint on file.   HPI: Patient is 53 y.o. male who  Past Medical History  Diagnosis Date  . Obesity   . Pulmonary embolism 05/04/2012    bilaterally  . Exertional dyspnea 05/04/2012    "isolated episode" (05/05/2012)  . Sleep apnea     "pretty sure; never been tested" (05/05/2012)  . History of chickenpox   . DVT (deep venous thrombosis)   . Diabetes mellitus, type II 02/2013    Past Surgical History  Procedure Laterality Date  . No past surgeries    . Laparotomy N/A 12/13/2013    Procedure: EXPLORATORY LAPAROTOMY Repair ventral hernia, without mesh, partial omentectomy;  Surgeon: Gwenyth Ober, MD;  Location: [place];  Service: General;  Laterality: N/A;      Medication List       This list is accurate as of: 01/18/14  9:01 PM.  Always use your most recent med list.               aspirin EC 81 MG tablet  Take 81 mg by mouth 2 (two) times daily.     metFORMIN 500 MG tablet  Commonly known as:  GLUCOPHAGE  Take 500 mg by mouth 2 (two) times daily with a meal.     multivitamin with minerals Tabs tablet  Take 2 tablets by mouth daily.     oxyCODONE-acetaminophen 5-325 MG per tablet  Commonly known as:  PERCOCET/ROXICET  Take 1 tablet by mouth every 6 (six) hours as needed for severe pain.     oxyCODONE-acetaminophen 5-325 MG per tablet  Commonly known as:  ROXICET  Take 1-2 tablets by mouth every 4 (four) hours  as needed for severe pain.     pantoprazole 40 MG tablet  Commonly known as:  PROTONIX  Take 1 tablet (40 mg total) by mouth daily at 12 noon.     promethazine 25 MG tablet  Commonly known as:  PHENERGAN  Take 25 mg by mouth every 6 (six) hours as needed for nausea or vomiting.        No orders of the defined types were placed in this encounter.    Immunization History  Administered Date(s) Administered  . Influenza Split 05/27/2012  . Influenza,inj,Quad PF,36+ Mos 02/15/2013  . Pneumococcal Polysaccharide-23 03/31/2013    History  Substance Use Topics  . Smoking status: Never Smoker   . Smokeless tobacco: Never Used     Comment: never used tobacco  . Alcohol Use: No    There were no vitals filed for this visit.  Physical Exam  GENERAL APPEARANCE: Alert, conversant. Appropriately groomed. No acute distress.  HEENT: Unremarkable. RESPIRATORY: Breathing is even, unlabored. Lung sounds are clear   CARDIOVASCULAR: Heart RRR no murmurs, rubs or gallops. No peripheral edema.  GASTROINTESTINAL: Abdomen is soft, non-tender, not distended w/ normal bowel sounds.  NEUROLOGIC: Cranial nerves 2-12 grossly intact. Moves all extremities no tremor.  Patient Active Problem List   Diagnosis Date Noted  . Postop check 12/31/2013  . HCAP (healthcare-associated  pneumonia) 12/28/2013  . Knee pain, right 12/28/2013  . Umbilical hernia, incarcerated 12/13/2013  . Type II or unspecified type diabetes mellitus without mention of complication, uncontrolled 03/31/2013  . Cellulitis and abscess of leg 03/23/2013  . Leukocytosis 03/23/2013  . Hyponatremia 03/23/2013  . Hematuria 03/23/2013  . Pulmonary embolism, hx of 05/05/2012  . Morbid obesity with body mass index of 40.0-44.9 in adult 05/05/2012  . Obstructive sleep apnea 05/05/2012  . DVT of left distal popliteal vein 05/05/2012    CBC    Component Value Date/Time   WBC 11.2* 12/21/2013 0026   WBC 9.2 08/05/2013 1502   RBC 4.01*  12/21/2013 0026   RBC 4.88 08/05/2013 1502   HGB 11.8* 12/21/2013 0026   HGB 14.0 08/05/2013 1502   HCT 36.0* 12/21/2013 0026   HCT 42.6 08/05/2013 1502   PLT 308 12/21/2013 0026   PLT 194 08/05/2013 1502   MCV 89.8 12/21/2013 0026   MCV 87 08/05/2013 1502   LYMPHSABS 2.3 12/19/2013 0506   LYMPHSABS 2.9 08/05/2013 1502   MONOABS 1.6* 12/19/2013 0506   EOSABS 0.5 12/19/2013 0506   EOSABS 0.4 08/05/2013 1502   BASOSABS 0.1 12/19/2013 0506   BASOSABS 0.0 08/05/2013 1502    CMP     Component Value Date/Time   NA 137 12/19/2013 0506   K 4.1 12/19/2013 0506   CL 100 12/19/2013 0506   CO2 25 12/19/2013 0506   GLUCOSE 109* 12/19/2013 0506   BUN 14 12/19/2013 0506   CREATININE 0.89 12/19/2013 0506   CALCIUM 8.4 12/19/2013 0506   PROT 8.2 12/10/2013 1917   ALBUMIN 3.8 12/10/2013 1917   AST 23 12/10/2013 1917   ALT 32 12/10/2013 1917   ALKPHOS 66 12/10/2013 1917   BILITOT 0.7 12/10/2013 1917   GFRNONAA >90 12/19/2013 0506   GFRAA >90 12/19/2013 0506    Assessment and Plan  No problem-specific assessment & plan notes found for this encounter.   Hennie Duos, MD    This encounter was created in error - please disregard.

## 2014-01-18 NOTE — Progress Notes (Signed)
MRN: [id number] Name: Evan Leonard  Sex: male Age: 53 y.o. DOB: 31-Oct-1960  [place] #: [facility] Facility/Room: [facility]A Level Of Care: SNF Provider: Inocencio Homes D Emergency Contacts: Extended Emergency Contact Information Primary Emergency Contact: Reed,Donna Address: [place] Mobile Phone: [phone] Relation: Friend Secondary Emergency Contact: Reed,Derick  [place] Phone: [phone] Relation: Other  Code Status: FULL  Allergies: Review of patient's allergies indicates no known allergies.  Chief Complaint  Patient presents with  . Discharge Note    HPI: Patient is 53 y.o. male who was admitted to SNF s/p incarcerated hernia repair who is now ready to go home.  Past Medical History  Diagnosis Date  . Obesity   . Pulmonary embolism 05/04/2012    bilaterally  . Exertional dyspnea 05/04/2012    "isolated episode" (05/05/2012)  . Sleep apnea     "pretty sure; never been tested" (05/05/2012)  . History of chickenpox   . DVT (deep venous thrombosis)   . Diabetes mellitus, type II 02/2013    Past Surgical History  Procedure Laterality Date  . No past surgeries    . Laparotomy N/A 12/13/2013    Procedure: EXPLORATORY LAPAROTOMY Repair ventral hernia, without mesh, partial omentectomy;  Surgeon: Gwenyth Ober, MD;  Location: [place];  Service: General;  Laterality: N/A;      Medication List       This list is accurate as of: 01/18/14  2:47 PM.  Always use your most recent med list.               aspirin EC 81 MG tablet  Take 81 mg by mouth 2 (two) times daily.     metFORMIN 500 MG tablet  Commonly known as:  GLUCOPHAGE  Take 500 mg by mouth 2 (two) times daily with a meal.     multivitamin with minerals Tabs tablet  Take 2 tablets by mouth daily.     oxyCODONE-acetaminophen 5-325 MG per tablet  Commonly known as:  PERCOCET/ROXICET  Take 1 tablet by mouth every 6 (six) hours as needed for severe pain.      oxyCODONE-acetaminophen 5-325 MG per tablet  Commonly known as:  ROXICET  Take 1-2 tablets by mouth every 4 (four) hours as needed for severe pain.     pantoprazole 40 MG tablet  Commonly known as:  PROTONIX  Take 1 tablet (40 mg total) by mouth daily at 12 noon.     promethazine 25 MG tablet  Commonly known as:  PHENERGAN  Take 25 mg by mouth every 6 (six) hours as needed for nausea or vomiting.        No orders of the defined types were placed in this encounter.    Immunization History  Administered Date(s) Administered  . Influenza Split 05/27/2012  . Influenza,inj,Quad PF,36+ Mos 02/15/2013  . Pneumococcal Polysaccharide-23 03/31/2013    History  Substance Use Topics  . Smoking status: Never Smoker   . Smokeless tobacco: Never Used     Comment: never used tobacco  . Alcohol Use: No    There were no vitals filed for this visit.  Physical Exam  GENERAL APPEARANCE: Alert, conversant. Appropriately groomed. No acute distress.  HEENT: Unremarkable. RESPIRATORY: Breathing is even, unlabored. Lung sounds are clear   CARDIOVASCULAR: Heart RRR no murmurs, rubs or gallops. No peripheral edema.  GASTROINTESTINAL: Abdomen is soft, non-tender, not distended w/ normal bowel sounds, dressing.  NEUROLOGIC: Cranial nerves 2-12 grossly intact. Moves  all extremities no tremor.  Patient Active Problem List   Diagnosis Date Noted  . Postop check 12/31/2013  . HCAP (healthcare-associated pneumonia) 12/28/2013  . Knee pain, right 12/28/2013  . Umbilical hernia, incarcerated 12/13/2013  . Type II or unspecified type diabetes mellitus without mention of complication, uncontrolled 03/31/2013  . Cellulitis and abscess of leg 03/23/2013  . Leukocytosis 03/23/2013  . Hyponatremia 03/23/2013  . Hematuria 03/23/2013  . Pulmonary embolism, hx of 05/05/2012  . Morbid obesity with body mass index of 40.0-44.9 in adult 05/05/2012  . Obstructive sleep apnea 05/05/2012  . DVT of left  distal popliteal vein 05/05/2012    CBC    Component Value Date/Time   WBC 11.2* 12/21/2013 0026   WBC 9.2 08/05/2013 1502   RBC 4.01* 12/21/2013 0026   RBC 4.88 08/05/2013 1502   HGB 11.8* 12/21/2013 0026   HGB 14.0 08/05/2013 1502   HCT 36.0* 12/21/2013 0026   HCT 42.6 08/05/2013 1502   PLT 308 12/21/2013 0026   PLT 194 08/05/2013 1502   MCV 89.8 12/21/2013 0026   MCV 87 08/05/2013 1502   LYMPHSABS 2.3 12/19/2013 0506   LYMPHSABS 2.9 08/05/2013 1502   MONOABS 1.6* 12/19/2013 0506   EOSABS 0.5 12/19/2013 0506   EOSABS 0.4 08/05/2013 1502   BASOSABS 0.1 12/19/2013 0506   BASOSABS 0.0 08/05/2013 1502    CMP     Component Value Date/Time   NA 137 12/19/2013 0506   K 4.1 12/19/2013 0506   CL 100 12/19/2013 0506   CO2 25 12/19/2013 0506   GLUCOSE 109* 12/19/2013 0506   BUN 14 12/19/2013 0506   CREATININE 0.89 12/19/2013 0506   CALCIUM 8.4 12/19/2013 0506   PROT 8.2 12/10/2013 1917   ALBUMIN 3.8 12/10/2013 1917   AST 23 12/10/2013 1917   ALT 32 12/10/2013 1917   ALKPHOS 66 12/10/2013 1917   BILITOT 0.7 12/10/2013 1917   GFRNONAA >90 12/19/2013 0506   GFRAA >90 12/19/2013 0506    Assessment and Pla Pt is improved and stable for discharge to home with necessary services and DME's.  Hennie Duos, MD

## 2014-01-19 ENCOUNTER — Telehealth: Payer: Self-pay | Admitting: Internal Medicine

## 2014-01-19 NOTE — Telephone Encounter (Addendum)
Pt is leaving [facility] tomorrow and needs ov w/in 2 wks.  Pt is leaving w/ a wound vac. Also had umbilical hernia. No 30 min. Is it ok to work in w/ dr Shawna Orleans? anoother provider? pls advise.

## 2014-01-20 NOTE — Telephone Encounter (Signed)
Charlene w/[facility]/also needed pt's appt. Everyone is aware of pt's appt on sept 10 at 1:15 pm. They will inform pt

## 2014-01-20 NOTE — Telephone Encounter (Signed)
Per cindy, ok to put 2 slots together for OV.

## 2014-01-24 ENCOUNTER — Emergency Department (HOSPITAL_COMMUNITY): Payer: PRIVATE HEALTH INSURANCE

## 2014-01-24 ENCOUNTER — Emergency Department (HOSPITAL_COMMUNITY)
Admission: EM | Admit: 2014-01-24 | Discharge: 2014-01-24 | Disposition: A | Discharge status: Home / Self Care | Payer: PRIVATE HEALTH INSURANCE | Attending: Emergency Medicine | Admitting: Emergency Medicine

## 2014-01-24 ENCOUNTER — Telehealth: Payer: Self-pay | Admitting: Internal Medicine

## 2014-01-24 ENCOUNTER — Encounter (HOSPITAL_COMMUNITY): Payer: Self-pay | Admitting: Emergency Medicine

## 2014-01-24 DIAGNOSIS — X500XXA Overexertion from strenuous movement or load, initial encounter: Secondary | ICD-10-CM | POA: Insufficient documentation

## 2014-01-24 DIAGNOSIS — Y9289 Other specified places as the place of occurrence of the external cause: Secondary | ICD-10-CM | POA: Diagnosis not present

## 2014-01-24 DIAGNOSIS — Y9389 Activity, other specified: Secondary | ICD-10-CM | POA: Insufficient documentation

## 2014-01-24 DIAGNOSIS — Z7982 Long term (current) use of aspirin: Secondary | ICD-10-CM | POA: Insufficient documentation

## 2014-01-24 DIAGNOSIS — Z86718 Personal history of other venous thrombosis and embolism: Secondary | ICD-10-CM | POA: Diagnosis not present

## 2014-01-24 DIAGNOSIS — Z79899 Other long term (current) drug therapy: Secondary | ICD-10-CM | POA: Diagnosis not present

## 2014-01-24 DIAGNOSIS — R296 Repeated falls: Secondary | ICD-10-CM | POA: Insufficient documentation

## 2014-01-24 DIAGNOSIS — E119 Type 2 diabetes mellitus without complications: Secondary | ICD-10-CM | POA: Diagnosis not present

## 2014-01-24 DIAGNOSIS — Z86711 Personal history of pulmonary embolism: Secondary | ICD-10-CM | POA: Insufficient documentation

## 2014-01-24 DIAGNOSIS — E669 Obesity, unspecified: Secondary | ICD-10-CM | POA: Insufficient documentation

## 2014-01-24 DIAGNOSIS — Z8619 Personal history of other infectious and parasitic diseases: Secondary | ICD-10-CM | POA: Diagnosis not present

## 2014-01-24 DIAGNOSIS — S99929A Unspecified injury of unspecified foot, initial encounter: Principal | ICD-10-CM

## 2014-01-24 DIAGNOSIS — S8990XA Unspecified injury of unspecified lower leg, initial encounter: Secondary | ICD-10-CM | POA: Diagnosis present

## 2014-01-24 DIAGNOSIS — M25561 Pain in right knee: Secondary | ICD-10-CM

## 2014-01-24 DIAGNOSIS — S99919A Unspecified injury of unspecified ankle, initial encounter: Secondary | ICD-10-CM | POA: Diagnosis present

## 2014-01-24 MED ORDER — HYDROCODONE-ACETAMINOPHEN 5-325 MG PO TABS
1.0000 | ORAL_TABLET | Freq: Four times a day (QID) | ORAL | Status: DC | PRN
Start: 2014-01-24 — End: 2014-12-13

## 2014-01-24 NOTE — Progress Notes (Signed)
Orthopedic Tech Progress Note Patient Details:  Evan Leonard 1960/06/17 [id number]  Ortho Devices Type of Ortho Device: Crutches;Knee Immobilizer Ortho Device/Splint Location: RLE Ortho Device/Splint Interventions: Ordered;Application   Braulio Bosch 01/24/2014, 9:08 PM

## 2014-01-24 NOTE — Telephone Encounter (Signed)
Pt had fall with no injury will get pt/ot to help with that, came home with a wound back pt will get nursing  3 times a week, pt was in rehab and came home over the weekend.

## 2014-01-24 NOTE — ED Notes (Signed)
Per EMS, pt home health nurse recommended he come for evaluation due to increased pain and limited mobility in R leg. Pt is post-hernia surgery and has hx of PE. Swelling and warmth noted to R leg by EMS.

## 2014-01-24 NOTE — Discharge Instructions (Signed)

## 2014-01-24 NOTE — ED Provider Notes (Signed)
CSN: [id number]     Arrival date & time 01/24/14  1734 History   First MD Initiated Contact with Patient 01/24/14 1735     Chief Complaint  Patient presents with  . Knee Pain     (Consider location/radiation/quality/duration/timing/severity/associated sxs/prior Treatment) HPI Comments: Patient presents emergency department with chief complaint of right knee pain. He states that he was walking with a walker, yesterday when he fell. He states that he twisted his knee. He states that since then, he has been unable to tolerate ambulating. States that his knee pain is worsened with movement and palpation. He denies any swelling. Denies any injuries elsewhere. Of note, the patient recently had a hernia repair. He states this is doing well. He denies any abdominal pain. He has a history of left leg DVT and PE, but currently denies any chest pain, shortness of breath, or leg swelling. States this pain started following the accident/fall.  The history is provided by the patient. No language interpreter was used.    Past Medical History  Diagnosis Date  . Obesity   . Pulmonary embolism 05/04/2012    bilaterally  . Exertional dyspnea 05/04/2012    "isolated episode" (05/05/2012)  . Sleep apnea     "pretty sure; never been tested" (05/05/2012)  . History of chickenpox   . DVT (deep venous thrombosis)   . Diabetes mellitus, type II 02/2013   Past Surgical History  Procedure Laterality Date  . No past surgeries    . Laparotomy N/A 12/13/2013    Procedure: EXPLORATORY LAPAROTOMY Repair ventral hernia, without mesh, partial omentectomy;  Surgeon: Gwenyth Ober, MD;  Location: [facility];  Service: General;  Laterality: N/A;   Family History  Problem Relation Age of Onset  . Diabetes Mother    History  Substance Use Topics  . Smoking status: Never Smoker   . Smokeless tobacco: Never Used     Comment: never used tobacco  . Alcohol Use: No    Review of Systems  Constitutional: Negative for fever  and chills.  Respiratory: Negative for shortness of breath.   Cardiovascular: Negative for chest pain.  Gastrointestinal: Negative for nausea, vomiting, diarrhea and constipation.  Genitourinary: Negative for dysuria.  Musculoskeletal: Positive for arthralgias.  All other systems reviewed and are negative.     Allergies  Review of patient's allergies indicates no known allergies.  Home Medications   Prior to Admission medications   Medication Sig Start Date End Date Taking? Authorizing Provider  aspirin EC 81 MG tablet Take 81 mg by mouth 2 (two) times daily.    Historical Provider, MD  metFORMIN (GLUCOPHAGE) 500 MG tablet Take 500 mg by mouth 2 (two) times daily with a meal.    Historical Provider, MD  Multiple Vitamin (MULTIVITAMIN WITH MINERALS) TABS tablet Take 2 tablets by mouth daily.     Historical Provider, MD  oxyCODONE-acetaminophen (PERCOCET/ROXICET) 5-325 MG per tablet Take 1 tablet by mouth every 6 (six) hours as needed for severe pain.    Historical Provider, MD  oxyCODONE-acetaminophen (ROXICET) 5-325 MG per tablet Take 1-2 tablets by mouth every 4 (four) hours as needed for severe pain. 12/21/13   Henreitta Cea, PA-C  pantoprazole (PROTONIX) 40 MG tablet Take 1 tablet (40 mg total) by mouth daily at 12 noon. 12/22/13   Megan Dort, PA-C  promethazine (PHENERGAN) 25 MG tablet Take 25 mg by mouth every 6 (six) hours as needed for nausea or vomiting.    Historical Provider, MD  BP 119/75  Pulse 86  Temp(Src) 98.4 F (36.9 C) (Oral)  Resp 14  SpO2 98% Physical Exam  Nursing note and vitals reviewed. Constitutional: He is oriented to person, place, and time. He appears well-developed and well-nourished.  Morbidly obese  HENT:  Head: Normocephalic and atraumatic.  Eyes: Conjunctivae and EOM are normal. Pupils are equal, round, and reactive to light. Right eye exhibits no discharge. Left eye exhibits no discharge. No scleral icterus.  Neck: Normal range of motion. Neck  supple. No JVD present.  Cardiovascular: Normal rate, regular rhythm and normal heart sounds.  Exam reveals no gallop and no friction rub.   No murmur heard. Pulmonary/Chest: Effort normal and breath sounds normal. No respiratory distress. He has no wheezes. He has no rales. He exhibits no tenderness.  Abdominal: Soft. He exhibits no distension and no mass. There is no tenderness. There is no rebound and no guarding.  Wound VAC in place over Center of abdomen, no surrounding erythema, no tenderness to palpation  Musculoskeletal: Normal range of motion. He exhibits no edema and no tenderness.  Right knee tenderness palpation over the lateral aspects, no bony abnormality or deformity, range of motion and strength limited secondary to pain  No calf tenderness, no evidence DVT, or septic joint  Neurological: He is alert and oriented to person, place, and time.  Skin: Skin is warm and dry.  No rash or erythema  Psychiatric: He has a normal mood and affect. His behavior is normal. Judgment and thought content normal.    ED Course  Procedures (including critical care time) Dg Knee Complete 4 Views Right  01/24/2014   CLINICAL DATA:  Posterior knee pain and decreased mobility after fall  EXAM: RIGHT KNEE - COMPLETE 4+ VIEW  COMPARISON:  None.  FINDINGS: Two 2 symptoms, there is suboptimal positioning with only 3 images as the patient cannot tolerate internal rotation. The joint space is not well characterized as a result. There is mild narrowing medially in laterally with mild lateral osteophyte formation. No fracture or dislocation identified. There is a small joint effusion. 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  IMPRESSION: Study is mildly to moderately limited as described above. Allowing for this, there is mild degenerative change with a small joint effusion but no acute findings.   Electronically Signed   By: Skipper Cliche M.D.   On: 01/24/2014 18:58      EKG Interpretation None       MDM   Final diagnoses:  Right knee pain    Patient with mechanical fall, and right knee pain. Will check plain films, and will reassess.  Patient presents for right knee pain. Plain films are negative except for a small joint effusion. No evidence of DVT, or cellulitis, or septic joint. Patient sustained a mechanical fall. Will treat with a knee immobilizer and crutches. Will discharge home with some pain medicine. Patient understands and agrees with plan. He is stable and ready for discharge.      Montine Circle, PA-C 01/24/14 2050

## 2014-01-24 NOTE — ED Notes (Signed)
Pt to xray at this time.

## 2014-01-24 NOTE — Telephone Encounter (Signed)
[name] has spoken w/ Evan Leonard and feels like he needs to be seen for his leg pain behind his right knee. leg is not swollen, red or warm to touch/ Evan Leonard is having some leg pain and cannot stand.   Evan Leonard is lives alone, but states people are in and out the entire day. Evan Leonard was in rehab for hernia repair and came home thursday. When nurse went to do assessment on 8/29/ she went to the car to make a call. Upon returning, found Evan Leonard on the floor and he states knees gave out. She thinks Evan Leonard getting worse. She will advise Evan Leonard to call EMS asap.

## 2014-01-24 NOTE — ED Provider Notes (Signed)
  Medical screening examination/treatment/procedure(s) were performed by non-physician practitioner and as supervising physician I was immediately available for consultation/collaboration.   EKG Interpretation None         Carmin Muskrat, MD 01/24/14 2344

## 2014-01-25 NOTE — Telephone Encounter (Signed)
I called pt to see if he wanted to come in for an appt w/ dr. Shawna Orleans, pt states he already has an appt w/ dr Shawna Orleans on 02/03/14, and that he has an orthopedic appt tomorrow

## 2014-02-03 ENCOUNTER — Ambulatory Visit: Payer: PRIVATE HEALTH INSURANCE | Admitting: Internal Medicine

## 2014-02-04 ENCOUNTER — Ambulatory Visit: Payer: PRIVATE HEALTH INSURANCE | Admitting: Internal Medicine

## 2014-02-07 ENCOUNTER — Encounter (INDEPENDENT_AMBULATORY_CARE_PROVIDER_SITE_OTHER): Payer: PRIVATE HEALTH INSURANCE | Admitting: General Surgery

## 2014-02-22 ENCOUNTER — Ambulatory Visit: Payer: PRIVATE HEALTH INSURANCE | Admitting: Internal Medicine

## 2014-02-23 ENCOUNTER — Telehealth: Payer: Self-pay | Admitting: Physician Assistant

## 2014-02-23 ENCOUNTER — Ambulatory Visit: Payer: PRIVATE HEALTH INSURANCE | Admitting: Physician Assistant

## 2014-02-23 NOTE — Telephone Encounter (Signed)
Attempted to call pt no answer  

## 2014-02-25 ENCOUNTER — Ambulatory Visit: Payer: PRIVATE HEALTH INSURANCE | Admitting: Physician Assistant

## 2014-02-28 NOTE — Telephone Encounter (Signed)
Called and spoke with pt and pt states he followed up with the surgeon about 1 week ago and pt was released.  Pt states his wound nurse comes every M, W, F and she came today and told pt his wound was healing quickly and it was 2cm x less than 1/2 cm.  Pt will come in to be seen by Rodman Key on 03/01/14.

## 2014-03-01 ENCOUNTER — Encounter: Payer: Self-pay | Admitting: Physician Assistant

## 2014-03-01 ENCOUNTER — Ambulatory Visit (INDEPENDENT_AMBULATORY_CARE_PROVIDER_SITE_OTHER): Payer: PRIVATE HEALTH INSURANCE | Admitting: Physician Assistant

## 2014-03-01 VITALS — BP 120/82 | HR 84 | Temp 98.4°F | Resp 18 | Wt 344.0 lb

## 2014-03-01 DIAGNOSIS — Z8719 Personal history of other diseases of the digestive system: Secondary | ICD-10-CM

## 2014-03-01 DIAGNOSIS — Z23 Encounter for immunization: Secondary | ICD-10-CM

## 2014-03-01 DIAGNOSIS — Z9889 Other specified postprocedural states: Secondary | ICD-10-CM

## 2014-03-01 DIAGNOSIS — S8991XD Unspecified injury of right lower leg, subsequent encounter: Secondary | ICD-10-CM

## 2014-03-01 DIAGNOSIS — Z09 Encounter for follow-up examination after completed treatment for conditions other than malignant neoplasm: Secondary | ICD-10-CM

## 2014-03-01 NOTE — Patient Instructions (Addendum)
Continue current medication regimen.   Continue Wound care at home MWF until wound is healed.  Continue PT twice weekly as directed.  Continue follow ups with orthopedics as directed.  If emergency symptoms discussed during visit developed, seek medical attention immediately.  Followup in about 1 month to reassess, or for worsening or persistent symptoms despite treatment.   Wound Care Wound care helps prevent pain and infection.  You may need a tetanus shot if:  You cannot remember when you had your last tetanus shot.  You have never had a tetanus shot.  The injury broke your skin. If you need a tetanus shot and you choose not to have one, you may get tetanus. Sickness from tetanus can be serious. HOME CARE   Only take medicine as told by your doctor.  Clean the wound daily with mild soap and water.  Change any bandages (dressings) as told by your doctor.  Put medicated cream and a bandage on the wound as told by your doctor.  Change the bandage if it gets wet, dirty, or starts to smell.  Take showers. Do not take baths, swim, or do anything that puts your wound under water.  Rest and raise (elevate) the wound until the pain and puffiness (swelling) are better.  Keep all doctor visits as told. GET HELP RIGHT AWAY IF:   Yellowish-white fluid (pus) comes from the wound.  Medicine does not lessen your pain.  There is a red streak going away from the wound.  You have a fever. MAKE SURE YOU:   Understand these instructions.  Will watch your condition.  Will get help right away if you are not doing well or get worse. Document Released: 02/20/2008 Document Revised: 08/05/2011 Document Reviewed: 09/16/2010 [facility] Patient Information 2015 [place], [place]. This information is not intended to replace advice given to you by your health care provider. Make sure you discuss any questions you have with your health care provider.

## 2014-03-01 NOTE — Progress Notes (Signed)
Subjective:    Patient ID: Evan Leonard, male    DOB: Oct 21, 1960, 53 y.o.   MRN: [id number]  HPI Patient is a 53 y.o. male presenting for post Rehab F/u.  See Discharge summary by Excell Seltzer, and Doreen Salvage MD See Nursing Home notes by Inocencio Homes MD.  The pt was seen in the ED on 12/13/2013 and found to have an incarcerated umbilical hernia. He then had surgery to repair this, as well as a partial removal of necrotic omentum. The pt was in ICU for 4 days post op prior to being sent to the floor, and was discharge on 12/22/2013 to SNF for rehab. He followed up with his surgeon on 12/31/2013 and wound was healing appropriately with wound vac. Pt was able to leave SNF on 01/18/2014, and has been receiving home wound care MWF and PT twice weekly since. The pt states that he also suffered a stress fracture of his knee, which was evaluated at the ED on 01/24/2014, and he had follow up visit for this with [facility]. He has another follow up for this next week. He states that occasionally he has leg swelling in his right leg related to the knee brace, however this resolves with leg raises, and is not painful. The pt states that he followed up with his surgeon about 2 weeks and the wound vac was removed, and the wound is still healing very well, and the pt was released form care. The pt states that he saw the wound care nurse yesterday, and she states that the wound is about 2cm x 0.5cm and clean. She is dressing this with abx ointment, gauze, and tape. He is having regular bowel movements daily without difficulty. He states he is no longer needing to take his prescribed pain medication.     Review of Systems Patient denies fevers, chills, nausea, vomiting, diarrhea, chest pain, shortness of breath, orthopnea, headache, syncope. Denies abdominal pain, change in appetite, change in bowel movements. Patient denies rashes, musculoskeletal complaints besides above mentioned. No other specific  complaints in a complete review of systems.    Past Medical History  Diagnosis Date  . Obesity   . Pulmonary embolism 05/04/2012    bilaterally  . Exertional dyspnea 05/04/2012    "isolated episode" (05/05/2012)  . Sleep apnea     "pretty sure; never been tested" (05/05/2012)  . History of chickenpox   . DVT (deep venous thrombosis)   . Diabetes mellitus, type II 02/2013    History   Social History  . Marital Status: Single    Spouse Name: N/A    Number of Children: N/A  . Years of Education: N/A   Occupational History  . [employer]   Social History Main Topics  . Smoking status: Never Smoker   . Smokeless tobacco: Never Used     Comment: never used tobacco  . Alcohol Use: No  . Drug Use: No  . Sexual Activity: Not on file   Other Topics Concern  . Not on file   Social History Narrative  . No narrative on file    Past Surgical History  Procedure Laterality Date  . No past surgeries    . Laparotomy N/A 12/13/2013    Procedure: EXPLORATORY LAPAROTOMY Repair ventral hernia, without mesh, partial omentectomy;  Surgeon: Gwenyth Ober, MD;  Location: [facility] OR;  Service: General;  Laterality: N/A;    Family History  Problem Relation Age of Onset  .  Diabetes Mother     No Known Allergies  Current Outpatient Prescriptions on File Prior to Visit  Medication Sig Dispense Refill  . aspirin EC 81 MG tablet Take 81 mg by mouth 2 (two) times daily.      Marland Kitchen HYDROcodone-acetaminophen (NORCO/VICODIN) 5-325 MG per tablet Take 1-2 tablets by mouth every 6 (six) hours as needed.  15 tablet  0  . metFORMIN (GLUCOPHAGE) 500 MG tablet Take 500 mg by mouth 2 (two) times daily with a meal.      . Multiple Vitamin (MULTIVITAMIN WITH MINERALS) TABS tablet Take 2 tablets by mouth daily.        No current facility-administered medications on file prior to visit.   The PFS history was reviewed with the Pt at time of visit.  EXAM: BP 120/82  Pulse 84  Temp(Src) 98.4  F (36.9 C) (Oral)  Resp 18  Wt 344 lb (156.037 kg)     Objective:   Physical Exam  Nursing note and vitals reviewed. Constitutional: He is oriented to person, place, and time. He appears well-developed and well-nourished. No distress.  HENT:  Head: Normocephalic and atraumatic.  Eyes: Conjunctivae and EOM are normal.  Cardiovascular: Normal rate, regular rhythm and intact distal pulses.   Pulmonary/Chest: Effort normal and breath sounds normal. No respiratory distress. He has no wheezes. He has no rales. He exhibits no tenderness.  Abdominal: Soft. Bowel sounds are normal. He exhibits no distension and no mass. There is no tenderness. There is no rebound and no guarding.  Musculoskeletal: He exhibits edema (bilat 1+ pitting edema.).  Neurological: He is alert and oriented to person, place, and time.  Skin: Skin is warm and dry. He is not diaphoretic. No pallor.  Wound visualized just below the umbilicus, the wound itselfs is approximately 2cm by less than 0.5cm and healing well. There is no surrounding erythema, this is non-ttp, no warmth to touch, no fluctuance.  Psychiatric: He has a normal mood and affect. His behavior is normal. Judgment and thought content normal.    Lab Results  Component Value Date   WBC 11.2* 12/21/2013   HGB 11.8* 12/21/2013   HCT 36.0* 12/21/2013   PLT 308 12/21/2013   GLUCOSE 109* 12/19/2013   ALT 32 12/10/2013   AST 23 12/10/2013   NA 137 12/19/2013   K 4.1 12/19/2013   CL 100 12/19/2013   CREATININE 0.89 12/19/2013   BUN 14 12/19/2013   CO2 25 12/19/2013   TSH 1.199 03/23/2013   INR 1.17 12/14/2013   HGBA1C 6.8* 12/13/2013   MICROALBUR 0.9 04/06/2013         Assessment & Plan:  Evan Leonard was seen today for post rehab f/u.  Diagnoses and associated orders for this visit:  Encounter for immunization  Follow up Comments: Over all healing well, continue wound care and PT. F/u in 1 month to reassess.  Knee injury, right, subsequent  encounter Comments: Has follow up with orthopedics, leg raises to reduce swelling. Continue follow up.  S/P unilateral hernia repair Comments: Pt released by surgery, healing well.    The dressing over the wound was removed for visualization, and then replaced with antibiotic ointment at the wound edge, and cover with plain 4x4 gauze and secured with paper tape. Pt has another wound check tomorrow with wound care.  Return precautions provided, and patient handout on wound care.  Plan to follow up in 1 month to reassess, or for worsening or persistent symptoms despite treatment.  Patient Instructions  Continue current medication regimen.   Continue Wound care at home MWF until wound is healed.  Continue PT twice weekly as directed.  Continue follow ups with orthopedics as directed.  If emergency symptoms discussed during visit developed, seek medical attention immediately.  Followup in about 1 month to reassess, or for worsening or persistent symptoms despite treatment.

## 2014-03-07 ENCOUNTER — Telehealth: Payer: Self-pay | Admitting: Internal Medicine

## 2014-03-07 NOTE — Telephone Encounter (Signed)
Pt needs new glucometer accu chek. Pt also needs Lancets, test strips  call into [facility] [phone]. Pt saw PA last week.

## 2014-03-07 NOTE — Telephone Encounter (Signed)
I spoke with Dr. Burnice Logan regarding the pt. Pt does not have a diagnosis of HTN in the past, and is not currently being treated for this. At last Office visit 1 week ago, the pt was normotensive with BP 120/82. Will have pt continue to monitor home BPs and adopt a low salt diet. If he continues to experience the elevated blood pressures, he needs to schedule OV to be evaluated.

## 2014-03-07 NOTE — Telephone Encounter (Signed)
Pt saw tucker 10/6. [place] w/ [facility] would like to leave BP readings:  Today  130/96 140/92  768/08   The diastolic has been a little high and debbie is concerned.

## 2014-03-08 MED ORDER — ACCU-CHEK AVIVA PLUS W/DEVICE KIT: PACK | Status: DC

## 2014-03-08 MED ORDER — GLUCOSE BLOOD VI STRP: ORAL_STRIP | Status: DC

## 2014-03-08 MED ORDER — ACCU-CHEK SOFT TOUCH LANCETS MISC: Status: DC

## 2014-03-08 NOTE — Telephone Encounter (Signed)
Left a message for [name] with [facility].  Left a message for pt to return call.

## 2014-03-08 NOTE — Telephone Encounter (Signed)
Called and spoke with pt and pt is aware.  Pt verbalized understanding and will monitor bp readings.

## 2014-03-08 NOTE — Telephone Encounter (Signed)
Rx sent to [facility] at [phone]. Called and spoke with a representative at [phone] to get fax information.

## 2014-04-08 ENCOUNTER — Ambulatory Visit: Payer: PRIVATE HEALTH INSURANCE | Admitting: Internal Medicine

## 2014-07-26 ENCOUNTER — Other Ambulatory Visit: Payer: Self-pay | Admitting: Internal Medicine

## 2014-10-21 ENCOUNTER — Encounter (HOSPITAL_COMMUNITY): Payer: Self-pay | Admitting: Emergency Medicine

## 2014-10-21 ENCOUNTER — Emergency Department (INDEPENDENT_AMBULATORY_CARE_PROVIDER_SITE_OTHER)
Admission: EM | Admit: 2014-10-21 | Discharge: 2014-10-21 | Disposition: A | Discharge status: Home / Self Care | Payer: PRIVATE HEALTH INSURANCE | Source: Home / Self Care | Attending: Emergency Medicine | Admitting: Emergency Medicine

## 2014-10-21 DIAGNOSIS — L03115 Cellulitis of right lower limb: Secondary | ICD-10-CM

## 2014-10-21 MED ORDER — CEPHALEXIN 500 MG PO CAPS: 500.0000 mg | ORAL_CAPSULE | Freq: Four times a day (QID) | ORAL | Status: DC

## 2014-10-21 MED ORDER — SULFAMETHOXAZOLE-TRIMETHOPRIM 800-160 MG PO TABS: 2.0000 | ORAL_TABLET | Freq: Two times a day (BID) | ORAL | Status: AC

## 2014-10-21 NOTE — ED Notes (Signed)
Pt states that he has had a wound on lower right leg for 2 weeks and it seems to be getting worse

## 2014-10-21 NOTE — ED Provider Notes (Signed)
CSN: [id number]     Arrival date & time 10/21/14  1147 History   First MD Initiated Contact with Patient 10/21/14 1326     No chief complaint on file.  (Consider location/radiation/quality/duration/timing/severity/associated sxs/prior Treatment) HPI  He is a 54 year old man here for evaluation of right lower leg infection. He states this started about 2 weeks ago with a small blister on the medial aspect of his right lower leg. It has gradually gotten larger. In the last day or so he has noticed some redness and warmth. There is no drainage. He has been keeping it clean with soap and water. He has been applying hydrogen peroxide. He has been using an about ointment and keeping it covered. He states he does typically wear his compression socks as he has swelling, especially in the right leg. No fevers or chills. No nausea or vomiting. He is a diabetic. He states he was diagnosed about one year ago. His blood sugars have been around 110.  Past Medical History  Diagnosis Date  . Obesity   . Pulmonary embolism 05/04/2012    bilaterally  . Exertional dyspnea 05/04/2012    "isolated episode" (05/05/2012)  . Sleep apnea     "pretty sure; never been tested" (05/05/2012)  . History of chickenpox   . DVT (deep venous thrombosis)   . Diabetes mellitus, type II 02/2013   Past Surgical History  Procedure Laterality Date  . No past surgeries    . Laparotomy N/A 12/13/2013    Procedure: EXPLORATORY LAPAROTOMY Repair ventral hernia, without mesh, partial omentectomy;  Surgeon: Gwenyth Ober, MD;  Location: [facility] OR;  Service: General;  Laterality: N/A;   Family History  Problem Relation Age of Onset  . Diabetes Mother    History  Substance Use Topics  . Smoking status: Never Smoker   . Smokeless tobacco: Never Used     Comment: never used tobacco  . Alcohol Use: No    Review of Systems As in history of present illness Allergies  Review of patient's allergies indicates no known allergies.  Home  Medications   Prior to Admission medications   Medication Sig Start Date End Date Taking? Authorizing Provider  aspirin EC 81 MG tablet Take 81 mg by mouth 2 (two) times daily.    Historical Provider, MD  Blood Glucose Monitoring Suppl (ACCU-CHEK AVIVA PLUS) W/DEVICE KIT Use as directed. 03/08/14   Zenaida Niece, PA-C  cephALEXin (KEFLEX) 500 MG capsule Take 1 capsule (500 mg total) by mouth 4 (four) times daily. 10/21/14   Melony Overly, MD  glucose blood (ACCU-CHEK AVIVA PLUS) test strip Test daily. 03/08/14   Zenaida Niece, PA-C  HYDROcodone-acetaminophen (NORCO/VICODIN) 5-325 MG per tablet Take 1-2 tablets by mouth every 6 (six) hours as needed. 01/24/14   Montine Circle, PA-C  ibuprofen (ADVIL,MOTRIN) 800 MG tablet  01/21/14   Historical Provider, MD  Lancets (ACCU-CHEK SOFT TOUCH) lancets Test daily. 03/08/14   Zenaida Niece, PA-C  metFORMIN (GLUCOPHAGE) 500 MG tablet TAKE 1 TABLET (500 MG TOTAL) BY MOUTH 2 (TWO) TIMES DAILY WITH A MEAL. 07/26/14   Doe-Hyun Kyra Searles, DO  Multiple Vitamin (MULTIVITAMIN WITH MINERALS) TABS tablet Take 2 tablets by mouth daily.     Historical Provider, MD  omeprazole (PRILOSEC) 40 MG capsule  01/21/14   Historical Provider, MD  oxyCODONE-acetaminophen (PERCOCET/ROXICET) 5-325 MG per tablet  01/26/14   Historical Provider, MD  promethazine (PHENERGAN) 25 MG tablet  01/21/14   Historical Provider, MD  sulfamethoxazole-trimethoprim (BACTRIM DS,SEPTRA DS) 800-160 MG per tablet Take 2 tablets by mouth 2 (two) times daily. 10/21/14 10/28/14  Melony Overly, MD   BP 143/79 mmHg  Pulse 89  Temp(Src) 99 F (37.2 C) (Oral)  Resp 22  SpO2 95% Physical Exam  Constitutional: He is oriented to person, place, and time. He appears well-developed and well-nourished. No distress.  Cardiovascular: Normal rate.   Pulmonary/Chest: Effort normal.  Neurological: He is alert and oriented to person, place, and time.  Skin:  4 x 6 cm area of blisters and early skin breakdown. There  is another 2-3 cm of surrounding erythema and warmth. No active drainage. He does have 2+ pitting edema in the leg. He is not wearing his compression stockings today.    ED Course  Procedures (including critical care time) Labs Review Labs Reviewed - No data to display  Imaging Review No results found.   MDM   1. Cellulitis of right lower leg    Will treat with Keflex and Bactrim. Wound care as in after visit summary. Discussed importance of controlling the swelling. Follow-up with PCP early next week for recheck. Go to the ER for any worsening.    Melony Overly, MD 10/21/14 [phone]

## 2014-10-21 NOTE — Discharge Instructions (Signed)
You have an infection in your legs. Take Bactrim 2 pills twice a day for the next 10 days. Take Keflex one pill 4 times a day for the next 10 days. Wash the leg with soap and water twice a day. Apply anti-biotic ointment and cover with gauze. Wear your compression stockings and keep your leg elevated as much as you can.  Follow-up with your regular doctor next week for recheck. If you think it is getting worse, please go to the emergency room.

## 2014-12-03 ENCOUNTER — Emergency Department (INDEPENDENT_AMBULATORY_CARE_PROVIDER_SITE_OTHER)
Admission: EM | Admit: 2014-12-03 | Discharge: 2014-12-03 | Disposition: A | Discharge status: Home / Self Care | Payer: PRIVATE HEALTH INSURANCE | Source: Home / Self Care | Attending: Family Medicine | Admitting: Family Medicine

## 2014-12-03 ENCOUNTER — Encounter (HOSPITAL_COMMUNITY): Payer: Self-pay | Admitting: *Deleted

## 2014-12-03 DIAGNOSIS — L03115 Cellulitis of right lower limb: Secondary | ICD-10-CM | POA: Diagnosis not present

## 2014-12-03 MED ORDER — CEPHALEXIN 500 MG PO CAPS: 500.0000 mg | ORAL_CAPSULE | Freq: Four times a day (QID) | ORAL | Status: DC

## 2014-12-03 NOTE — ED Provider Notes (Signed)
CSN: [id number]     Arrival date & time 12/03/14  1730 History   First MD Initiated Contact with Patient 12/03/14 1850     Chief Complaint  Patient presents with  . Wound Check   (Consider location/radiation/quality/duration/timing/severity/associated sxs/prior Treatment) Patient is a 54 y.o. male presenting with wound check. The history is provided by the patient.  Wound Check This is a chronic problem. The current episode started more than 1 week ago (present for over 18mo, briefly improved with abx.). The problem has been gradually worsening.    Past Medical History  Diagnosis Date  . Obesity   . Pulmonary embolism 05/04/2012    bilaterally  . Exertional dyspnea 05/04/2012    "isolated episode" (05/05/2012)  . Sleep apnea     "pretty sure; never been tested" (05/05/2012)  . History of chickenpox   . DVT (deep venous thrombosis)   . Diabetes mellitus, type II 02/2013   Past Surgical History  Procedure Laterality Date  . No past surgeries    . Laparotomy N/A 12/13/2013    Procedure: EXPLORATORY LAPAROTOMY Repair ventral hernia, without mesh, partial omentectomy;  Surgeon: JGwenyth Ober MD;  Location: [facility] OR;  Service: General;  Laterality: N/A;   Family History  Problem Relation Age of Onset  . Diabetes Mother    History  Substance Use Topics  . Smoking status: Never Smoker   . Smokeless tobacco: Never Used     Comment: never used tobacco  . Alcohol Use: No    Review of Systems  Constitutional: Negative.   Skin: Positive for wound.    Allergies  Review of patient's allergies indicates no known allergies.  Home Medications   Prior to Admission medications   Medication Sig Start Date End Date Taking? Authorizing Provider  aspirin EC 81 MG tablet Take 81 mg by mouth 2 (two) times daily.    Historical Provider, MD  Blood Glucose Monitoring Suppl (ACCU-CHEK AVIVA PLUS) W/DEVICE KIT Use as directed. 03/08/14   MZenaida Niece PA-C  cephALEXin (KEFLEX) 500 MG capsule  Take 1 capsule (500 mg total) by mouth 4 (four) times daily. Take all of medicine and drink lots of fluids 12/03/14   JBilly Fischer MD  glucose blood (ACCU-CHEK AVIVA PLUS) test strip Test daily. 03/08/14   MZenaida Niece PA-C  HYDROcodone-acetaminophen (NORCO/VICODIN) 5-325 MG per tablet Take 1-2 tablets by mouth every 6 (six) hours as needed. 01/24/14   RMontine Circle PA-C  ibuprofen (ADVIL,MOTRIN) 800 MG tablet  01/21/14   Historical Provider, MD  Lancets (ACCU-CHEK SOFT TOUCH) lancets Test daily. 03/08/14   MZenaida Niece PA-C  metFORMIN (GLUCOPHAGE) 500 MG tablet TAKE 1 TABLET (500 MG TOTAL) BY MOUTH 2 (TWO) TIMES DAILY WITH A MEAL. 07/26/14   Doe-Hyun RKyra Searles DO  Multiple Vitamin (MULTIVITAMIN WITH MINERALS) TABS tablet Take 2 tablets by mouth daily.     Historical Provider, MD  omeprazole (PRILOSEC) 40 MG capsule  01/21/14   Historical Provider, MD  oxyCODONE-acetaminophen (PERCOCET/ROXICET) 5-325 MG per tablet  01/26/14   Historical Provider, MD  promethazine (PHENERGAN) 25 MG tablet  01/21/14   Historical Provider, MD   BP 136/81 mmHg  Pulse 96  Temp(Src) 98.7 F (37.1 C) (Oral)  Resp 16  SpO2 98% Physical Exam  Constitutional: He is oriented to person, place, and time. He appears well-developed and well-nourished.  Musculoskeletal: Normal range of motion. He exhibits edema. He exhibits no tenderness.  Neurological: He is alert and oriented to person, place,  and time.  Skin: Skin is warm and dry. Rash noted. There is erythema.  4x6cm erythema  Nursing note and vitals reviewed.   ED Course  Procedures (including critical care time) Labs Review Labs Reviewed - No data to display  Imaging Review No results found.   MDM   1. Cellulitis of right lower leg        Billy Fischer, MD 12/03/14 1910

## 2014-12-03 NOTE — Discharge Instructions (Signed)
Leave wrap on leg until seen by your doctor for recheck on tues.

## 2014-12-03 NOTE — Progress Notes (Signed)
Orthopedic Tech Progress Note Patient Details:  Evan Leonard July 10, 1960 [id number]  Ortho Devices Type of Ortho Device: Haematologist Ortho Device/Splint Location: RLE Ortho Device/Splint Interventions: Ordered, Application   Braulio Bosch 12/03/2014, 7:21 PM

## 2014-12-03 NOTE — ED Notes (Signed)
Pt  Has   An  Infected  Area  To  r  Lower  Leg  For  Almost  1  Month    He   Reports  Pain in the  Leg       Pt  Is  A  Diabetic     With  An  Area  Of  Skin  Breakdown  Present

## 2014-12-13 ENCOUNTER — Encounter: Payer: Self-pay | Admitting: Adult Health

## 2014-12-13 ENCOUNTER — Ambulatory Visit (INDEPENDENT_AMBULATORY_CARE_PROVIDER_SITE_OTHER): Payer: PRIVATE HEALTH INSURANCE | Admitting: Adult Health

## 2014-12-13 VITALS — BP 120/86 | Temp 98.4°F | Ht 74.0 in | Wt 379.2 lb

## 2014-12-13 DIAGNOSIS — L03115 Cellulitis of right lower limb: Secondary | ICD-10-CM | POA: Diagnosis not present

## 2014-12-13 DIAGNOSIS — R21 Rash and other nonspecific skin eruption: Secondary | ICD-10-CM | POA: Diagnosis not present

## 2014-12-13 MED ORDER — CEPHALEXIN 500 MG PO CAPS: 500.0000 mg | ORAL_CAPSULE | Freq: Three times a day (TID) | ORAL | Status: DC

## 2014-12-13 MED ORDER — TRIAMCINOLONE ACETONIDE 0.1 % EX CREA: 1.0000 "application " | TOPICAL_CREAM | Freq: Two times a day (BID) | CUTANEOUS | Status: DC

## 2014-12-13 NOTE — Progress Notes (Signed)
   Subjective:    Patient ID: Evan Leonard, male    DOB: 1960/11/25, 54 y.o.   MRN: [id number]  HPI  54 year old male with  has a past medical history of Obesity; Pulmonary embolism (05/04/2012); Exertional dyspnea (05/04/2012); Sleep apnea; History of chickenpox; DVT (deep venous thrombosis); and Diabetes mellitus, type II (02/2013). Presents to the office today for wound care follow up. He was previously seen in the ER on 10/21/2014 and was given Keflex and Bactrim. He then returned to the ER on 12/03/2014 and was given another course of Keflex, he has two days left of this course. He believes that the wound is getting larger and continues to drain.   Denies any pain to the site. No warmth, redness or foul smelling discharge.   The wound is 12 x 29 cm and appears well healing.   His other complaint is that of a red itchy rash on bilateral arms x 1 week. It started on the left arm and has spread to the left. Red raised areas with no drainage or warmth. Has been using hydrocortisone cream, which helps with the itching.    Review of Systems  Constitutional: Negative.   Respiratory: Negative.   Cardiovascular: Negative.   Musculoskeletal: Negative.   Skin: Positive for color change, rash and wound.  Neurological: Negative.   All other systems reviewed and are negative.      Objective:   Physical Exam  Constitutional: He is oriented to person, place, and time. He appears well-developed and well-nourished. No distress.  Cardiovascular: Normal rate, regular rhythm, normal heart sounds and intact distal pulses.  Exam reveals no gallop and no friction rub.   No murmur heard. Pulmonary/Chest: Effort normal and breath sounds normal. No respiratory distress. He has no wheezes. He has no rales. He exhibits no tenderness.  Musculoskeletal: Normal range of motion. He exhibits edema (bilateral lower extremities). He exhibits no tenderness.  Neurological: He is alert and oriented to person, place, and  time.  Skin: Skin is warm and dry. Rash noted. He is not diaphoretic. There is erythema.  Large 12 x 29 cm wound to right lower extremity. No signs of infection   Red, raised rash on bilateral upper extremities L>R. Stops at shirt line of upper arm  Psychiatric: He has a normal mood and affect. His behavior is normal. Judgment and thought content normal.  Nursing note and vitals reviewed.     Assessment & Plan:  1. Cellulitis of right lower extremity - Follow up in one week for wound check - AMB referral to wound care center - cephALEXin (KEFLEX) 500 MG capsule; Take 1 capsule (500 mg total) by mouth 3 (three) times daily.  Dispense: 21 capsule; Refill: 0 - Keep wound clean and dressed during the day. Loosely dress during the evening.    2. Rash and nonspecific skin eruption - Likely due to sun exposure and interaction with ABX.  - triamcinolone cream (KENALOG) 0.1 %; Apply 1 application topically 2 (two) times daily.  Dispense: 45 g; Refill: 2 - Wear long sleeves, if able

## 2014-12-13 NOTE — Patient Instructions (Signed)
I have sent in a referral to wound care. Continue to dress the wound during the day. At night cover the wound loosely. Continue with the antibiotics. I have sent in a prescription for an additional 7 days.   I have also sent in a prescription for a hydrocortisone cream that you can apply to the rash on your upper arms.   Please follow up in one week.

## 2014-12-13 NOTE — Progress Notes (Signed)
Pre visit review using our clinic review tool, if applicable. No additional management support is needed unless otherwise documented below in the visit note. 

## 2014-12-20 ENCOUNTER — Encounter: Payer: Self-pay | Admitting: Adult Health

## 2014-12-20 ENCOUNTER — Ambulatory Visit (INDEPENDENT_AMBULATORY_CARE_PROVIDER_SITE_OTHER): Payer: PRIVATE HEALTH INSURANCE | Admitting: Adult Health

## 2014-12-20 VITALS — BP 134/92 | Temp 98.6°F | Ht 74.0 in | Wt 379.0 lb

## 2014-12-20 DIAGNOSIS — R21 Rash and other nonspecific skin eruption: Secondary | ICD-10-CM

## 2014-12-20 DIAGNOSIS — L03115 Cellulitis of right lower limb: Secondary | ICD-10-CM

## 2014-12-20 LAB — BASIC METABOLIC PANEL
BUN: 16 mg/dL (ref 6–23)
CO2: 28 mEq/L (ref 19–32)
Calcium: 9.5 mg/dL (ref 8.4–10.5)
Chloride: 105 mEq/L (ref 96–112)
Creatinine, Ser: 0.99 mg/dL (ref 0.40–1.50)
GFR: 83.23 mL/min (ref >60.00)
Glucose, Bld: 134 mg/dL — ABNORMAL HIGH (ref 70–99)
Potassium: 4.8 mEq/L (ref 3.5–5.1)
Sodium: 141 mEq/L (ref 135–145)

## 2014-12-20 MED ORDER — FUROSEMIDE 40 MG PO TABS: 40.0000 mg | ORAL_TABLET | Freq: Every day | ORAL | Status: DC

## 2014-12-20 NOTE — Patient Instructions (Signed)
I have sent a prescription for Lasix 40 mg to the pharmacy. Please take these as directed.   Apply the Silvadine cream to the wound twice a day.   Finish your antibiotics and follow up with me next week for a wound check.   If you notice and redness going up your leg, change in drainage or odor,please let me kniw.

## 2014-12-20 NOTE — Progress Notes (Signed)
Subjective:    Patient ID: Evan Leonard, male    DOB: 1960/08/12, 54 y.o.   MRN: [id number]  HPI  54 year old male who presents to the office today for wound check. I saw him one week ago for 12 x 29 cm cellulitic area on medial aspect of right leg. We continued his course of Keflex for an additional week. Wound care consult was placed   He also was given Kenalog cream for possible sun exposure rash on bilateral arms.   Cellulitis of right leg - He has an appointment with Wound care on August 10th. His wound has not changed in size or appearance. Granulation of tissue is apparent. Does not appear infected.     Rash and nonspecific skin eruption Healing well. He endorses significant improvement of rash and associated itching after applying Kenalog cream.   Review of Systems  Constitutional: Negative.   Skin: Positive for rash (bilateral arms) and wound (right medial leg).  Neurological: Negative.   All other systems reviewed and are negative.  Past Medical History  Diagnosis Date  . Obesity   . Pulmonary embolism 05/04/2012    bilaterally  . Exertional dyspnea 05/04/2012    "isolated episode" (05/05/2012)  . Sleep apnea     "pretty sure; never been tested" (05/05/2012)  . History of chickenpox   . DVT (deep venous thrombosis)   . Diabetes mellitus, type II 02/2013    History   Social History  . Marital Status: Single    Spouse Name: N/A  . Number of Children: N/A  . Years of Education: N/A   Occupational History  . [employer]   Social History Main Topics  . Smoking status: Never Smoker   . Smokeless tobacco: Never Used     Comment: never used tobacco  . Alcohol Use: No  . Drug Use: No  . Sexual Activity: Not on file   Other Topics Concern  . Not on file   Social History Narrative    Past Surgical History  Procedure Laterality Date  . No past surgeries    . Laparotomy N/A 12/13/2013    Procedure: EXPLORATORY LAPAROTOMY Repair ventral  hernia, without mesh, partial omentectomy;  Surgeon: Gwenyth Ober, MD;  Location: [facility] OR;  Service: General;  Laterality: N/A;    Family History  Problem Relation Age of Onset  . Diabetes Mother     No Known Allergies  Current Outpatient Prescriptions on File Prior to Visit  Medication Sig Dispense Refill  . aspirin EC 81 MG tablet Take 81 mg by mouth 2 (two) times daily.    . Blood Glucose Monitoring Suppl (ACCU-CHEK AVIVA PLUS) W/DEVICE KIT Use as directed. 1 kit 0  . cephALEXin (KEFLEX) 500 MG capsule Take 1 capsule (500 mg total) by mouth 4 (four) times daily. Take all of medicine and drink lots of fluids 28 capsule 0  . cephALEXin (KEFLEX) 500 MG capsule Take 1 capsule (500 mg total) by mouth 3 (three) times daily. 21 capsule 0  . glucose blood (ACCU-CHEK AVIVA PLUS) test strip Test daily. 100 each 12  . ibuprofen (ADVIL,MOTRIN) 800 MG tablet     . Lancets (ACCU-CHEK SOFT TOUCH) lancets Test daily. 100 each 12  . metFORMIN (GLUCOPHAGE) 500 MG tablet TAKE 1 TABLET (500 MG TOTAL) BY MOUTH 2 (TWO) TIMES DAILY WITH A MEAL. 60 tablet 5  . Multiple Vitamin (MULTIVITAMIN WITH MINERALS) TABS tablet Take 2 tablets by mouth daily.     Marland Kitchen  omeprazole (PRILOSEC) 40 MG capsule     . triamcinolone cream (KENALOG) 0.1 % Apply 1 application topically 2 (two) times daily. 45 g 2   No current facility-administered medications on file prior to visit.    BP 134/92 mmHg  Temp(Src) 98.6 F (37 C) (Oral)  Ht _0  (1.88 m)  Wt 379 lb (171.913 kg)  BMI 48.64 kg/m2       Objective:   Physical Exam  Constitutional: He is oriented to person, place, and time. He appears well-developed and well-nourished. No distress.  Musculoskeletal: Normal range of motion. He exhibits edema (bilateral lower extremities +2 non pitting) and tenderness.  Neurological: He is alert and oriented to person, place, and time.  Skin: Skin is warm and dry. Rash (Appears to be healing well. Significantly less redness) noted.  He is not diaphoretic.  No apparent change in wound to right leg.     Psychiatric: He has a normal mood and affect. His behavior is normal. Judgment and thought content normal.  Nursing note and vitals reviewed.      Assessment & Plan:  1. Cellulitis of leg, right - Continue with current abx regimen until finished - furosemide (LASIX) 40 MG tablet; Take 1 tablet (40 mg total) by mouth daily.  Dispense: 30 tablet; Refill: 3 - Basic metabolic panel - Apply thin layer of silvadene cream to wound and cover BID  - Follow up in 7 days or sooner if needed - Continue to elevate legs above heart for 30 minutes at a time BID 2. Rash and nonspecific skin eruption - Continue with Kenalog 1% cream

## 2014-12-20 NOTE — Progress Notes (Signed)
Pre visit review using our clinic review tool, if applicable. No additional management support is needed unless otherwise documented below in the visit note. 

## 2014-12-27 ENCOUNTER — Ambulatory Visit (INDEPENDENT_AMBULATORY_CARE_PROVIDER_SITE_OTHER): Payer: PRIVATE HEALTH INSURANCE | Admitting: Adult Health

## 2014-12-27 ENCOUNTER — Encounter: Payer: Self-pay | Admitting: Adult Health

## 2014-12-27 VITALS — BP 130/88 | Temp 99.4°F | Ht 74.0 in | Wt 374.0 lb

## 2014-12-27 DIAGNOSIS — L03119 Cellulitis of unspecified part of limb: Secondary | ICD-10-CM

## 2014-12-27 DIAGNOSIS — R600 Localized edema: Secondary | ICD-10-CM | POA: Diagnosis not present

## 2014-12-27 DIAGNOSIS — L02419 Cutaneous abscess of limb, unspecified: Secondary | ICD-10-CM | POA: Diagnosis not present

## 2014-12-27 LAB — BASIC METABOLIC PANEL WITH GFR
BUN: 15 mg/dL (ref 6–23)
CO2: 29 meq/L (ref 19–32)
Calcium: 9.1 mg/dL (ref 8.4–10.5)
Chloride: 106 meq/L (ref 96–112)
Creatinine, Ser: 0.97 mg/dL (ref 0.40–1.50)
GFR: 85.2 mL/min
Glucose, Bld: 159 mg/dL — ABNORMAL HIGH (ref 70–99)
Potassium: 4.7 meq/L (ref 3.5–5.1)
Sodium: 141 meq/L (ref 135–145)

## 2014-12-27 NOTE — Progress Notes (Signed)
Pre visit review using our clinic review tool, if applicable. No additional management support is needed unless otherwise documented below in the visit note. 

## 2014-12-27 NOTE — Patient Instructions (Signed)
Follow up with me a week or two after you see wound care.   Continue with current therapy.   If you notice any signs or symptoms of infection, please let me know.

## 2014-12-27 NOTE — Progress Notes (Signed)
Subjective:    Patient ID: Evan Leonard, male    DOB: [birth year], 54 y.o.   MRN: [id number]  HPI  54 year old pleasant male, presents to the office today for follow up regarding wound to right leg. I saw him one week ago and since that time he has finished his abx. He endorses no change in wound to right leg besides that is has less draining. His rash on his upper extremities has resolved.    Review of Systems Review of Systems  Constitutional: Negative.  Skin: Positive for  wound (right medial leg). Rash on upper extremities has resolved.  Neurological: Negative.  All other systems reviewed and are negative. Past Medical History  Diagnosis Date  . Obesity   . Pulmonary embolism 05/04/2012    bilaterally  . Exertional dyspnea 05/04/2012    "isolated episode" (05/05/2012)  . Sleep apnea     "pretty sure; never been tested" (05/05/2012)  . History of chickenpox   . DVT (deep venous thrombosis)   . Diabetes mellitus, type II 02/2013    History   Social History  . Marital Status: Single    Spouse Name: N/A  . Number of Children: N/A  . Years of Education: N/A   Occupational History  . [employer]   Social History Main Topics  . Smoking status: Never Smoker   . Smokeless tobacco: Never Used     Comment: never used tobacco  . Alcohol Use: No  . Drug Use: No  . Sexual Activity: Not on file   Other Topics Concern  . Not on file   Social History Narrative    Past Surgical History  Procedure Laterality Date  . No past surgeries    . Laparotomy N/A 12/13/2013    Procedure: EXPLORATORY LAPAROTOMY Repair ventral hernia, without mesh, partial omentectomy;  Surgeon: Gwenyth Ober, MD;  Location: [facility];  Service: General;  Laterality: N/A;    Family History  Problem Relation Age of Onset  . Diabetes Mother     No Known Allergies  Current Outpatient Prescriptions on File Prior to Visit  Medication Sig Dispense Refill  . aspirin EC 81 MG tablet  Take 81 mg by mouth 2 (two) times daily.    . Blood Glucose Monitoring Suppl (ACCU-CHEK AVIVA PLUS) W/DEVICE KIT Use as directed. 1 kit 0  . cephALEXin (KEFLEX) 500 MG capsule Take 1 capsule (500 mg total) by mouth 3 (three) times daily. 21 capsule 0  . furosemide (LASIX) 40 MG tablet Take 1 tablet (40 mg total) by mouth daily. 30 tablet 3  . glucose blood (ACCU-CHEK AVIVA PLUS) test strip Test daily. 100 each 12  . ibuprofen (ADVIL,MOTRIN) 800 MG tablet     . Lancets (ACCU-CHEK SOFT TOUCH) lancets Test daily. 100 each 12  . metFORMIN (GLUCOPHAGE) 500 MG tablet TAKE 1 TABLET (500 MG TOTAL) BY MOUTH 2 (TWO) TIMES DAILY WITH A MEAL. 60 tablet 5  . Multiple Vitamin (MULTIVITAMIN WITH MINERALS) TABS tablet Take 2 tablets by mouth daily.     Marland Kitchen omeprazole (PRILOSEC) 40 MG capsule     . triamcinolone cream (KENALOG) 0.1 % Apply 1 application topically 2 (two) times daily. 45 g 2   No current facility-administered medications on file prior to visit.    BP 130/88 mmHg  Temp(Src) 99.4 F (37.4 C) (Oral)  Ht $R'6\' 2"'vw$  (1.88 m)  Wt 374 lb (169.645 kg)  BMI 48.00 kg/m2  Objective:   Physical Exam  Constitutional: He is oriented to person, place, and time. He appears well-developed and well-nourished. No distress.  Musculoskeletal: Normal range of motion. He exhibits edema (bilateral lower extremities +2 non pitting) and tenderness.  Neurological: He is alert and oriented to person, place, and time.  Skin: Skin is warm and dry  He is not diaphoretic.  No apparent change in wound to right leg.  Psychiatric: He has a normal mood and affect. His behavior is normal. Judgment and thought content normal.  Nursing note and vitals reviewed.      Assessment & Plan:  1. Cellulitis and abscess of leg - Follow up with wound care - Continue current therapy - Follow up with me a week after seeing wound care.   2. Bilateral edema of lower extremity - Basic metabolic panel

## 2015-01-04 ENCOUNTER — Encounter (HOSPITAL_BASED_OUTPATIENT_CLINIC_OR_DEPARTMENT_OTHER): Payer: PRIVATE HEALTH INSURANCE | Attending: Surgery

## 2015-01-04 DIAGNOSIS — I890 Lymphedema, not elsewhere classified: Secondary | ICD-10-CM | POA: Insufficient documentation

## 2015-01-04 DIAGNOSIS — L97919 Non-pressure chronic ulcer of unspecified part of right lower leg with unspecified severity: Secondary | ICD-10-CM | POA: Diagnosis present

## 2015-01-04 DIAGNOSIS — E11622 Type 2 diabetes mellitus with other skin ulcer: Secondary | ICD-10-CM | POA: Diagnosis not present

## 2015-01-04 DIAGNOSIS — I87311 Chronic venous hypertension (idiopathic) with ulcer of right lower extremity: Secondary | ICD-10-CM | POA: Insufficient documentation

## 2015-01-11 ENCOUNTER — Other Ambulatory Visit: Payer: Self-pay | Admitting: Surgery

## 2015-01-11 ENCOUNTER — Ambulatory Visit (HOSPITAL_COMMUNITY)
Admission: RE | Admit: 2015-01-11 | Discharge: 2015-01-11 | Disposition: A | Discharge status: Home / Self Care | Payer: PRIVATE HEALTH INSURANCE | Source: Ambulatory Visit | Attending: Vascular Surgery | Admitting: Vascular Surgery

## 2015-01-11 DIAGNOSIS — L97909 Non-pressure chronic ulcer of unspecified part of unspecified lower leg with unspecified severity: Secondary | ICD-10-CM

## 2015-01-11 DIAGNOSIS — I82411 Acute embolism and thrombosis of right femoral vein: Secondary | ICD-10-CM | POA: Insufficient documentation

## 2015-01-11 DIAGNOSIS — I872 Venous insufficiency (chronic) (peripheral): Secondary | ICD-10-CM | POA: Diagnosis not present

## 2015-01-11 DIAGNOSIS — L97919 Non-pressure chronic ulcer of unspecified part of right lower leg with unspecified severity: Secondary | ICD-10-CM | POA: Diagnosis not present

## 2015-01-17 ENCOUNTER — Ambulatory Visit: Payer: PRIVATE HEALTH INSURANCE | Admitting: Adult Health

## 2015-01-18 DIAGNOSIS — L97919 Non-pressure chronic ulcer of unspecified part of right lower leg with unspecified severity: Secondary | ICD-10-CM | POA: Diagnosis not present

## 2015-01-23 ENCOUNTER — Telehealth: Payer: Self-pay | Admitting: Vascular Surgery

## 2015-01-23 NOTE — Telephone Encounter (Signed)
Pt called - has wound care appointment the morning before our appointment. Wanted to know what to do. Per [place], do not see wound care, we will bandage here as we need to see the legs

## 2015-01-24 ENCOUNTER — Ambulatory Visit: Payer: PRIVATE HEALTH INSURANCE | Admitting: Adult Health

## 2015-01-24 ENCOUNTER — Encounter: Payer: Self-pay | Admitting: Vascular Surgery

## 2015-01-25 ENCOUNTER — Encounter: Payer: Self-pay | Admitting: Vascular Surgery

## 2015-01-25 ENCOUNTER — Ambulatory Visit (INDEPENDENT_AMBULATORY_CARE_PROVIDER_SITE_OTHER): Payer: PRIVATE HEALTH INSURANCE | Admitting: Vascular Surgery

## 2015-01-25 VITALS — HR 97 | Temp 100.0°F | Resp 18 | Ht 74.0 in | Wt 377.0 lb

## 2015-01-25 DIAGNOSIS — I82439 Acute embolism and thrombosis of unspecified popliteal vein: Secondary | ICD-10-CM

## 2015-01-25 DIAGNOSIS — I2699 Other pulmonary embolism without acute cor pulmonale: Secondary | ICD-10-CM | POA: Diagnosis not present

## 2015-01-25 DIAGNOSIS — I872 Venous insufficiency (chronic) (peripheral): Secondary | ICD-10-CM

## 2015-01-25 NOTE — Progress Notes (Signed)
Referred by:  Meda Coffee, DO [address] [place], [place] [address]  Reason for referral: Swollen bilateral leg  History of Present Illness  Evan Leonard is a 54 y.o. (12-07-1960) male who presents with chief complaint: swollen B legs.  Patient notes, onset of swelling years ago, associated with DVT.  The patient's symptoms include: shortness of breath due to PE at time of DVT.  The patient notes he had a LLE DVT at that time.  He has had bilateral leg swelling since then.  The patient has had known history of DVT, + history of varicose vein, no history of venous stasis ulcers, no history of  Lymphedema and known history of skin changes in lower legs.  There is no family history of venous disorders.  The patient has not used compression stockings in the past.   Past Medical History  Diagnosis Date  . Obesity   . Pulmonary embolism 05/04/2012    bilaterally  . Exertional dyspnea 05/04/2012    "isolated episode" (05/05/2012)  . Sleep apnea     "pretty sure; never been tested" (05/05/2012)  . History of chickenpox   . DVT (deep venous thrombosis)   . Diabetes mellitus, type II 02/2013  . Varicose veins     Past Surgical History  Procedure Laterality Date  . No past surgeries    . Laparotomy N/A 12/13/2013    Procedure: EXPLORATORY LAPAROTOMY Repair ventral hernia, without mesh, partial omentectomy;  Surgeon: Cherylynn Ridges, MD;  Location: [facility];  Service: General;  Laterality: N/A;    Social History   Social History  . Marital Status: Single    Spouse Name: N/A  . Number of Children: N/A  . Years of Education: N/A   Occupational History  . [employer]   Social History Main Topics  . Smoking status: Never Smoker   . Smokeless tobacco: Never Used     Comment: never used tobacco  . Alcohol Use: No  . Drug Use: No  . Sexual Activity: Not on file   Other Topics Concern  . Not on file   Social History Narrative    Family History  Problem  Relation Age of Onset  . Diabetes Mother   . Hypertension Mother     Current Outpatient Prescriptions on File Prior to Visit  Medication Sig Dispense Refill  . aspirin EC 81 MG tablet Take 81 mg by mouth 2 (two) times daily.    . Blood Glucose Monitoring Suppl (ACCU-CHEK AVIVA PLUS) W/DEVICE KIT Use as directed. 1 kit 0  . furosemide (LASIX) 40 MG tablet Take 1 tablet (40 mg total) by mouth daily. 30 tablet 3  . glucose blood (ACCU-CHEK AVIVA PLUS) test strip Test daily. 100 each 12  . ibuprofen (ADVIL,MOTRIN) 800 MG tablet     . Lancets (ACCU-CHEK SOFT TOUCH) lancets Test daily. 100 each 12  . metFORMIN (GLUCOPHAGE) 500 MG tablet TAKE 1 TABLET (500 MG TOTAL) BY MOUTH 2 (TWO) TIMES DAILY WITH A MEAL. 60 tablet 5  . Multiple Vitamin (MULTIVITAMIN WITH MINERALS) TABS tablet Take 2 tablets by mouth daily.     . cephALEXin (KEFLEX) 500 MG capsule Take 1 capsule (500 mg total) by mouth 3 (three) times daily. (Patient not taking: Reported on 01/25/2015) 21 capsule 0  . omeprazole (PRILOSEC) 40 MG capsule     . triamcinolone cream (KENALOG) 0.1 % Apply 1 application topically 2 (two) times daily. (Patient not taking: Reported  on 01/25/2015) 45 g 2   No current facility-administered medications on file prior to visit.    No Known Allergies   REVIEW OF SYSTEMS:  (Positives checked otherwise negative)  CARDIOVASCULAR:  []  chest pain, []  chest pressure, []  palpitations, []  shortness of breath when laying flat, []  shortness of breath with exertion,  []  pain in feet when walking, []  pain in feet when laying flat, [x]  history of blood clot in veins (DVT), []  history of phlebitis, [x]  swelling in legs, [x]  varicose veins  PULMONARY:  []  productive cough, []  asthma, []  wheezing  NEUROLOGIC:  []  weakness in arms or legs, []  numbness in arms or legs, []  difficulty speaking or slurred speech, []  temporary loss of vision in one eye, []  dizziness  HEMATOLOGIC:  []  bleeding problems, []  problems with blood  clotting too easily  MUSCULOSKEL:  []  joint pain, []  joint swelling  GASTROINTEST:  []  vomiting blood, []  blood in stool     GENITOURINARY:  []  burning with urination, []  blood in urine  PSYCHIATRIC:  []  history of major depression  INTEGUMENTARY:  []  rashes, []  ulcers  CONSTITUTIONAL:  []  fever, []  chills   Physical Examination Filed Vitals:   01/25/15 1358  Pulse: 97  Temp: 100 F (37.8 C)  Resp: 18  Height: 6\' 2"  (1.88 m)  Weight: 377 lb (171.006 kg)  SpO2: 95%   Body mass index is 48.38 kg/(m^2).  General: A&O x 3, WD, morbidly obese  Head: [place]/AT  Ear/Nose/Throat: Hearing grossly intact, nares w/o erythema or drainage, oropharynx w/o Erythema/Exudate  Eyes: PERRLA, EOMI  Neck: Supple, no nuchal rigidity, no palpable LAD  Pulmonary: Sym exp, good air movt, CTAB, no rales, rhonchi, & wheezing  Cardiac: RRR, Nl S1, S2, no Murmurs, rubs or gallops  Vascular: Vessel Right Left  Radial Palpable Palpable  Brachial Palpable Palpable  Carotid Palpable, without bruit Palpable, without bruit  Aorta Not palpable N/A  Femoral Palpable Palpable  Popliteal Not palpable Not palpable  PT Palpable Palpable  DP Palpable Palpable   Gastrointestinal: soft, NTND, -G/R, - HSM, - masses, - CVAT B  Musculoskeletal: M/S 5/5 throughout , Extremities without ischemic changes , BLE LDS, edema 1+ bilaterally  Neurologic: CN 2-12 intact , Pain and light touch intact in extremities , Motor exam as listed above  Psychiatric: Judgment intact, Mood & affect appropriate for pt's clinical situation  Dermatologic: See M/S exam for extremity exam, no rashes otherwise noted  Lymph : No Cervical, Axillary, or Inguinal lymphadenopathy    Non-Invasive Vascular Imaging  LLE Venous Insufficiency Duplex (Date: 01/25/2015):   RLE: R prox fem with chronic DVT, no SVT, + GSV reflux: limited to proximal calf, + deep venous reflux: FV, PV    Medical Decision Making  Evan Leonard is a 54  y.o. 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  No indication for intervention at this phase of his disease.  Based on the patient's history and examination, I recommend: compressive therapy.  I discussed with the patient the use of her 20-30 mm thigh high compression stockings.  Thank you for allowing [place] to participate in this patient's care.  [name], MD [facility] Office: [phone] Pager: [phone]  01/25/2015, 5:10 PM

## 2015-01-26 ENCOUNTER — Encounter (HOSPITAL_BASED_OUTPATIENT_CLINIC_OR_DEPARTMENT_OTHER): Payer: PRIVATE HEALTH INSURANCE | Attending: Internal Medicine

## 2015-01-26 DIAGNOSIS — I890 Lymphedema, not elsewhere classified: Secondary | ICD-10-CM | POA: Diagnosis not present

## 2015-01-26 DIAGNOSIS — I87311 Chronic venous hypertension (idiopathic) with ulcer of right lower extremity: Secondary | ICD-10-CM | POA: Diagnosis not present

## 2015-01-26 DIAGNOSIS — E11622 Type 2 diabetes mellitus with other skin ulcer: Secondary | ICD-10-CM | POA: Diagnosis not present

## 2015-01-26 DIAGNOSIS — L97811 Non-pressure chronic ulcer of other part of right lower leg limited to breakdown of skin: Secondary | ICD-10-CM | POA: Diagnosis present

## 2015-02-01 ENCOUNTER — Encounter (HOSPITAL_BASED_OUTPATIENT_CLINIC_OR_DEPARTMENT_OTHER): Payer: PRIVATE HEALTH INSURANCE | Attending: Surgery

## 2015-02-01 DIAGNOSIS — L97811 Non-pressure chronic ulcer of other part of right lower leg limited to breakdown of skin: Secondary | ICD-10-CM | POA: Insufficient documentation

## 2015-02-01 DIAGNOSIS — I87311 Chronic venous hypertension (idiopathic) with ulcer of right lower extremity: Secondary | ICD-10-CM | POA: Insufficient documentation

## 2015-02-01 DIAGNOSIS — E11622 Type 2 diabetes mellitus with other skin ulcer: Secondary | ICD-10-CM | POA: Diagnosis not present

## 2015-02-01 DIAGNOSIS — I890 Lymphedema, not elsewhere classified: Secondary | ICD-10-CM | POA: Insufficient documentation

## 2015-02-02 ENCOUNTER — Ambulatory Visit: Payer: PRIVATE HEALTH INSURANCE | Admitting: Adult Health

## 2015-02-08 DIAGNOSIS — L97811 Non-pressure chronic ulcer of other part of right lower leg limited to breakdown of skin: Secondary | ICD-10-CM | POA: Diagnosis not present

## 2015-02-15 DIAGNOSIS — L97811 Non-pressure chronic ulcer of other part of right lower leg limited to breakdown of skin: Secondary | ICD-10-CM | POA: Diagnosis not present

## 2015-02-22 DIAGNOSIS — L97811 Non-pressure chronic ulcer of other part of right lower leg limited to breakdown of skin: Secondary | ICD-10-CM | POA: Diagnosis not present

## 2015-03-01 ENCOUNTER — Encounter (HOSPITAL_BASED_OUTPATIENT_CLINIC_OR_DEPARTMENT_OTHER): Payer: PRIVATE HEALTH INSURANCE | Attending: Surgery

## 2015-03-01 DIAGNOSIS — E11622 Type 2 diabetes mellitus with other skin ulcer: Secondary | ICD-10-CM | POA: Insufficient documentation

## 2015-03-01 DIAGNOSIS — Z86711 Personal history of pulmonary embolism: Secondary | ICD-10-CM | POA: Insufficient documentation

## 2015-03-01 DIAGNOSIS — L97311 Non-pressure chronic ulcer of right ankle limited to breakdown of skin: Secondary | ICD-10-CM | POA: Diagnosis not present

## 2015-03-01 DIAGNOSIS — E669 Obesity, unspecified: Secondary | ICD-10-CM | POA: Insufficient documentation

## 2015-03-01 DIAGNOSIS — Z6841 Body Mass Index (BMI) 40.0 and over, adult: Secondary | ICD-10-CM | POA: Diagnosis not present

## 2015-03-01 DIAGNOSIS — Z86718 Personal history of other venous thrombosis and embolism: Secondary | ICD-10-CM | POA: Diagnosis not present

## 2015-03-08 DIAGNOSIS — E11622 Type 2 diabetes mellitus with other skin ulcer: Secondary | ICD-10-CM | POA: Diagnosis not present

## 2015-04-05 ENCOUNTER — Ambulatory Visit (HOSPITAL_COMMUNITY)
Admission: RE | Admit: 2015-04-05 | Discharge: 2015-04-05 | Disposition: A | Discharge status: Home / Self Care | Payer: PRIVATE HEALTH INSURANCE | Source: Ambulatory Visit | Attending: Surgery | Admitting: Surgery

## 2015-04-05 ENCOUNTER — Other Ambulatory Visit (HOSPITAL_COMMUNITY): Payer: Self-pay | Admitting: *Deleted

## 2015-04-05 ENCOUNTER — Encounter (HOSPITAL_BASED_OUTPATIENT_CLINIC_OR_DEPARTMENT_OTHER): Payer: PRIVATE HEALTH INSURANCE | Attending: Surgery

## 2015-04-05 DIAGNOSIS — Z86718 Personal history of other venous thrombosis and embolism: Secondary | ICD-10-CM | POA: Insufficient documentation

## 2015-04-05 DIAGNOSIS — E11622 Type 2 diabetes mellitus with other skin ulcer: Secondary | ICD-10-CM | POA: Insufficient documentation

## 2015-04-05 DIAGNOSIS — I87311 Chronic venous hypertension (idiopathic) with ulcer of right lower extremity: Secondary | ICD-10-CM | POA: Insufficient documentation

## 2015-04-05 DIAGNOSIS — R609 Edema, unspecified: Secondary | ICD-10-CM | POA: Diagnosis not present

## 2015-04-05 DIAGNOSIS — E119 Type 2 diabetes mellitus without complications: Secondary | ICD-10-CM | POA: Diagnosis not present

## 2015-04-05 DIAGNOSIS — L97811 Non-pressure chronic ulcer of other part of right lower leg limited to breakdown of skin: Secondary | ICD-10-CM | POA: Insufficient documentation

## 2015-04-05 DIAGNOSIS — I890 Lymphedema, not elsewhere classified: Secondary | ICD-10-CM | POA: Diagnosis not present

## 2015-04-05 DIAGNOSIS — Z86711 Personal history of pulmonary embolism: Secondary | ICD-10-CM | POA: Insufficient documentation

## 2015-04-05 DIAGNOSIS — M7989 Other specified soft tissue disorders: Secondary | ICD-10-CM | POA: Diagnosis not present

## 2015-04-05 DIAGNOSIS — Z6841 Body Mass Index (BMI) 40.0 and over, adult: Secondary | ICD-10-CM | POA: Insufficient documentation

## 2015-04-05 NOTE — Progress Notes (Signed)
*  Preliminary Results* Right lower extremity venous duplex completed. Study was technically difficult due to patient body habitus. Visualized veins of the right lower extremity are negative for deep vein thrombosis. There is no evidence of right Baker's cyst.  04/05/2015 10:56 AM  Maudry Mayhew, RVT, RDCS, RDMS

## 2015-04-10 ENCOUNTER — Other Ambulatory Visit: Payer: Self-pay | Admitting: Internal Medicine

## 2015-04-12 DIAGNOSIS — E11622 Type 2 diabetes mellitus with other skin ulcer: Secondary | ICD-10-CM | POA: Diagnosis not present

## 2015-04-19 DIAGNOSIS — E11622 Type 2 diabetes mellitus with other skin ulcer: Secondary | ICD-10-CM | POA: Diagnosis not present

## 2015-04-26 DIAGNOSIS — E11622 Type 2 diabetes mellitus with other skin ulcer: Secondary | ICD-10-CM | POA: Diagnosis not present

## 2015-05-17 ENCOUNTER — Observation Stay (HOSPITAL_COMMUNITY)
Admission: EM | Admit: 2015-05-17 | Discharge: 2015-05-18 | Disposition: A | Discharge status: Home / Self Care | Payer: PRIVATE HEALTH INSURANCE | Attending: Family Medicine | Admitting: Family Medicine

## 2015-05-17 ENCOUNTER — Encounter (HOSPITAL_COMMUNITY): Payer: Self-pay | Admitting: *Deleted

## 2015-05-17 ENCOUNTER — Emergency Department (HOSPITAL_BASED_OUTPATIENT_CLINIC_OR_DEPARTMENT_OTHER)
Admit: 2015-05-17 | Discharge: 2015-05-17 | Disposition: A | Discharge status: Home / Self Care | Payer: PRIVATE HEALTH INSURANCE | Attending: Emergency Medicine | Admitting: Emergency Medicine

## 2015-05-17 ENCOUNTER — Encounter (HOSPITAL_BASED_OUTPATIENT_CLINIC_OR_DEPARTMENT_OTHER): Payer: PRIVATE HEALTH INSURANCE | Attending: Surgery

## 2015-05-17 DIAGNOSIS — E1165 Type 2 diabetes mellitus with hyperglycemia: Secondary | ICD-10-CM | POA: Diagnosis not present

## 2015-05-17 DIAGNOSIS — Z6841 Body Mass Index (BMI) 40.0 and over, adult: Secondary | ICD-10-CM | POA: Insufficient documentation

## 2015-05-17 DIAGNOSIS — L03115 Cellulitis of right lower limb: Secondary | ICD-10-CM | POA: Diagnosis not present

## 2015-05-17 DIAGNOSIS — L97819 Non-pressure chronic ulcer of other part of right lower leg with unspecified severity: Secondary | ICD-10-CM | POA: Insufficient documentation

## 2015-05-17 DIAGNOSIS — Z86711 Personal history of pulmonary embolism: Secondary | ICD-10-CM | POA: Insufficient documentation

## 2015-05-17 DIAGNOSIS — Z86718 Personal history of other venous thrombosis and embolism: Secondary | ICD-10-CM | POA: Diagnosis not present

## 2015-05-17 DIAGNOSIS — E11622 Type 2 diabetes mellitus with other skin ulcer: Secondary | ICD-10-CM | POA: Insufficient documentation

## 2015-05-17 DIAGNOSIS — R2241 Localized swelling, mass and lump, right lower limb: Secondary | ICD-10-CM

## 2015-05-17 DIAGNOSIS — I890 Lymphedema, not elsewhere classified: Secondary | ICD-10-CM | POA: Insufficient documentation

## 2015-05-17 DIAGNOSIS — I87311 Chronic venous hypertension (idiopathic) with ulcer of right lower extremity: Secondary | ICD-10-CM | POA: Insufficient documentation

## 2015-05-17 DIAGNOSIS — G4730 Sleep apnea, unspecified: Secondary | ICD-10-CM | POA: Diagnosis not present

## 2015-05-17 DIAGNOSIS — Z791 Long term (current) use of non-steroidal anti-inflammatories (NSAID): Secondary | ICD-10-CM | POA: Insufficient documentation

## 2015-05-17 DIAGNOSIS — Z79899 Other long term (current) drug therapy: Secondary | ICD-10-CM | POA: Diagnosis not present

## 2015-05-17 DIAGNOSIS — Z7984 Long term (current) use of oral hypoglycemic drugs: Secondary | ICD-10-CM | POA: Diagnosis not present

## 2015-05-17 DIAGNOSIS — Z7982 Long term (current) use of aspirin: Secondary | ICD-10-CM | POA: Insufficient documentation

## 2015-05-17 DIAGNOSIS — E669 Obesity, unspecified: Secondary | ICD-10-CM | POA: Insufficient documentation

## 2015-05-17 DIAGNOSIS — M7989 Other specified soft tissue disorders: Secondary | ICD-10-CM | POA: Diagnosis present

## 2015-05-17 LAB — CBC WITH DIFFERENTIAL/PLATELET
Basophils Absolute: 0.1 10*3/uL (ref 0.0–0.1)
Basophils Relative: 1 %
Eosinophils Absolute: 0.4 10*3/uL (ref 0.0–0.7)
Eosinophils Relative: 4 %
HCT: 43.1 % (ref 39.0–52.0)
Hemoglobin: 14.2 g/dL (ref 13.0–17.0)
Lymphocytes Relative: 23 %
Lymphs Abs: 2.5 10*3/uL (ref 0.7–4.0)
MCH: 29.6 pg (ref 26.0–34.0)
MCHC: 32.9 g/dL (ref 30.0–36.0)
MCV: 90 fL (ref 78.0–100.0)
Monocytes Absolute: 0.6 10*3/uL (ref 0.1–1.0)
Monocytes Relative: 5 %
Neutro Abs: 7.1 10*3/uL (ref 1.7–7.7)
Neutrophils Relative %: 67 %
Platelets: 315 10*3/uL (ref 150–400)
RBC: 4.79 MIL/uL (ref 4.22–5.81)
RDW: 14.1 % (ref 11.5–15.5)
WBC: 10.7 10*3/uL — ABNORMAL HIGH (ref 4.0–10.5)

## 2015-05-17 LAB — BASIC METABOLIC PANEL
Anion gap: 11 (ref 5–15)
BUN: 16 mg/dL (ref 6–20)
CO2: 26 mmol/L (ref 22–32)
Calcium: 9.1 mg/dL (ref 8.9–10.3)
Chloride: 105 mmol/L (ref 101–111)
Creatinine, Ser: 0.99 mg/dL (ref 0.61–1.24)
GFR calc Af Amer: >60 mL/min (ref >60)
GFR calc non Af Amer: >60 mL/min (ref >60)
Glucose, Bld: 141 mg/dL — ABNORMAL HIGH (ref 65–99)
Potassium: 4.1 mmol/L (ref 3.5–5.1)
Sodium: 142 mmol/L (ref 135–145)

## 2015-05-17 LAB — GLUCOSE, CAPILLARY
Glucose-Capillary: 98 mg/dL (ref 65–99)
Glucose-Capillary: 133 mg/dL — ABNORMAL HIGH (ref 65–99)

## 2015-05-17 MED ORDER — ADULT MULTIVITAMIN W/MINERALS CH
1.0000 | ORAL_TABLET | Freq: Every day | ORAL | Status: DC
  Administered 2015-05-17 – 2015-05-18 (×2): 1 via ORAL
  Filled 2015-05-17 (×2): qty 1

## 2015-05-17 MED ORDER — OXYCODONE-ACETAMINOPHEN 5-325 MG PO TABS
2.0000 | ORAL_TABLET | Freq: Once | ORAL | Status: AC
Start: 2015-05-17 — End: 2015-05-17
  Administered 2015-05-17: 2 via ORAL
  Filled 2015-05-17: qty 2

## 2015-05-17 MED ORDER — DEXTROSE 5 % IV SOLN
1.0000 g | Freq: Once | INTRAVENOUS | Status: AC
  Administered 2015-05-17: 1 g via INTRAVENOUS
  Filled 2015-05-17: qty 10

## 2015-05-17 MED ORDER — INSULIN ASPART 100 UNIT/ML ~~LOC~~ SOLN: 0.0000 [IU] | Freq: Every day | SUBCUTANEOUS | Status: DC

## 2015-05-17 MED ORDER — VANCOMYCIN HCL IN DEXTROSE 1-5 GM/200ML-% IV SOLN
1000.0000 mg | Freq: Once | INTRAVENOUS | Status: AC
  Administered 2015-05-17: 1000 mg via INTRAVENOUS
  Filled 2015-05-17: qty 200

## 2015-05-17 MED ORDER — VANCOMYCIN HCL 10 G IV SOLR
1500.0000 mg | Freq: Once | INTRAVENOUS | Status: AC
  Administered 2015-05-17: 1500 mg via INTRAVENOUS
  Filled 2015-05-17: qty 1500

## 2015-05-17 MED ORDER — SODIUM CHLORIDE 0.9 % IJ SOLN
3.0000 mL | Freq: Two times a day (BID) | INTRAMUSCULAR | Status: DC
  Administered 2015-05-17 – 2015-05-18 (×2): 3 mL via INTRAVENOUS

## 2015-05-17 MED ORDER — OXYCODONE-ACETAMINOPHEN 5-325 MG PO TABS: 1.0000 | ORAL_TABLET | ORAL | Status: DC | PRN

## 2015-05-17 MED ORDER — HEPARIN SODIUM (PORCINE) 5000 UNIT/ML IJ SOLN
5000.0000 [IU] | Freq: Three times a day (TID) | INTRAMUSCULAR | Status: DC
  Administered 2015-05-17 – 2015-05-18 (×2): 5000 [IU] via SUBCUTANEOUS
  Filled 2015-05-17 (×5): qty 1

## 2015-05-17 MED ORDER — ONDANSETRON HCL 4 MG/2ML IJ SOLN: 4.0000 mg | Freq: Three times a day (TID) | INTRAMUSCULAR | Status: DC | PRN

## 2015-05-17 MED ORDER — INSULIN ASPART 100 UNIT/ML ~~LOC~~ SOLN
0.0000 [IU] | Freq: Three times a day (TID) | SUBCUTANEOUS | Status: DC
  Administered 2015-05-18 (×2): 3 [IU] via SUBCUTANEOUS

## 2015-05-17 MED ORDER — PIPERACILLIN-TAZOBACTAM 3.375 G IVPB
3.3750 g | Freq: Three times a day (TID) | INTRAVENOUS | Status: DC
  Administered 2015-05-17 – 2015-05-18 (×2): 3.375 g via INTRAVENOUS
  Filled 2015-05-17 (×4): qty 50

## 2015-05-17 MED ORDER — SODIUM CHLORIDE 0.9 % IV SOLN: 250.0000 mL | INTRAVENOUS | Status: DC | PRN

## 2015-05-17 MED ORDER — SODIUM CHLORIDE 0.9 % IJ SOLN: 3.0000 mL | INTRAMUSCULAR | Status: DC | PRN

## 2015-05-17 MED ORDER — PIPERACILLIN-TAZOBACTAM 3.375 G IVPB 30 MIN
3.3750 g | Freq: Once | INTRAVENOUS | Status: AC
  Administered 2015-05-17: 3.375 g via INTRAVENOUS
  Filled 2015-05-17: qty 50

## 2015-05-17 MED ORDER — VANCOMYCIN HCL 10 G IV SOLR
1250.0000 mg | Freq: Two times a day (BID) | INTRAVENOUS | Status: DC
  Administered 2015-05-18: 1250 mg via INTRAVENOUS
  Filled 2015-05-17 (×2): qty 1250

## 2015-05-17 MED ORDER — ASPIRIN EC 81 MG PO TBEC
81.0000 mg | DELAYED_RELEASE_TABLET | Freq: Two times a day (BID) | ORAL | Status: DC
  Administered 2015-05-17 – 2015-05-18 (×2): 81 mg via ORAL
  Filled 2015-05-17 (×3): qty 1

## 2015-05-17 NOTE — H&P (Signed)
[facility] History and Physical  Evan Leonard YHC:[id number] DOB: [birth year] DOA: 05/17/2015  Referring physician: Dr. Sabra Heck PCP: Evan Pry, DO   Chief Complaint: RLE pain and redness  HPI: Evan Leonard is a 54 y.o. male  With history diabetes prior left lower extremity DVT who is presenting with right lower extremity swelling pain and erythema. This is been going on for the last 5 days before the last 3 days has gotten worse. Patient denies trying any oral antibiotics for improvement of his right lower extremity cellulitis. He was going to wound care and they indicated he come to the hospital for further evaluation. The patient denies any fevers or chills. The problem has been persistent since onset. And the problem has been progressively getting worse.   Review of Systems:  Constitutional:  No weight loss, night sweats, Fevers, chills, fatigue.  HEENT:  No headaches, Difficulty swallowing,Tooth/dental problems,Sore throat,  No sneezing, itching, ear ache, nasal congestion, post nasal drip,  Cardio-vascular:  No chest pain, Orthopnea, PND, swelling in lower extremities, anasarca, dizziness, palpitations  GI:  No heartburn, indigestion, abdominal pain, nausea, vomiting, diarrhea, change in bowel habits, loss of appetite  Resp:  No shortness of breath with exertion or at rest. No excess mucus, no productive cough, No non-productive cough, No coughing up of blood.No change in color of mucus.No wheezing.No chest wall deformity  Skin:  no rash or lesions.  GU:  no dysuria, change in color of urine, no urgency or frequency. No flank pain.  Musculoskeletal:  + joint pain or swelling. No decreased range of motion. No back pain.  Psych:  No change in mood or affect. No depression or anxiety. No memory loss.   Past Medical History  Diagnosis Date  . Obesity   . Pulmonary embolism ([place]) 05/04/2012    bilaterally  . Exertional dyspnea 05/04/2012    "isolated episode"  (05/05/2012)  . Sleep apnea     "pretty sure; never been tested" (05/05/2012)  . History of chickenpox   . DVT (deep venous thrombosis) ([place])   . Diabetes mellitus, type II ([place]) 02/2013  . Varicose veins    Past Surgical History  Procedure Laterality Date  . No past surgeries    . Laparotomy N/A 12/13/2013    Procedure: EXPLORATORY LAPAROTOMY Repair ventral hernia, without mesh, partial omentectomy;  Surgeon: Evan Ober, MD;  Location: [facility];  Service: General;  Laterality: N/A;   Social History:  reports that he has never smoked. He has never used smokeless tobacco. He reports that he does not drink alcohol or use illicit drugs.  No Known Allergies  Family History  Problem Relation Age of Onset  . Diabetes Mother   . Hypertension Mother     Prior to Admission medications   Medication Sig Start Date End Date Taking? Authorizing Provider  aspirin EC 81 MG tablet Take 81 mg by mouth 2 (two) times daily.   Yes Historical Provider, MD  Blood Glucose Monitoring Suppl (ACCU-CHEK AVIVA PLUS) W/DEVICE KIT Use as directed. 03/08/14  Yes Zenaida Niece, PA-C  glucose blood (ACCU-CHEK AVIVA PLUS) test strip Test daily. 03/08/14  Yes Zenaida Niece, PA-C  ibuprofen (ADVIL,MOTRIN) 600 MG tablet Take 600 mg by mouth every 6 (six) hours as needed for moderate pain.   Yes Historical Provider, MD  Lancets (ACCU-CHEK SOFT TOUCH) lancets Test daily. 03/08/14  Yes Zenaida Niece, PA-C  metFORMIN (GLUCOPHAGE) 500 MG tablet TAKE 1 TABLET (500 MG TOTAL) BY MOUTH 2 (TWO)  TIMES DAILY WITH A MEAL. 04/10/15  Yes Doe-Hyun Kyra Searles, DO  Multiple Vitamin (MULTIVITAMIN WITH MINERALS) TABS tablet Take 1 tablet by mouth daily.    Yes Historical Provider, MD  furosemide (LASIX) 40 MG tablet Take 1 tablet (40 mg total) by mouth daily. Patient not taking: Reported on 05/17/2015 12/20/14   Evan Peng, NP   Physical Exam: Filed Vitals:   05/17/15 1236 05/17/15 1249 05/17/15 1518  BP: 147/90  162/90  Pulse:  88 97 83  Temp: 97.9 F (36.6 C)  97.7 F (36.5 C)  TempSrc: Oral  Oral  Resp: 18  17  SpO2: 100% 99% 100%    Wt Readings from Last 3 Encounters:  01/25/15 171.006 kg (377 lb)  12/27/14 169.645 kg (374 lb)  12/20/14 171.913 kg (379 lb)    General:  Appears calm and comfortable Eyes: PERRL, normal lids, irises & conjunctiva ENT: grossly normal hearing, lips & tongue Neck: no LAD, masses or thyromegaly Cardiovascular: RRR, no m/r/g. No LE edema. Respiratory: CTA bilaterally, no w/r/r. Normal respiratory effort. Abdomen: soft, ntnd Skin: Cellulitis with errythema at RLE Musculoskeletal: RLE cellulitis, pain on palpation Psychiatric: grossly normal mood and affect, speech fluent and appropriate Neurologic: grossly non-focal.          Labs on Admission:  Basic Metabolic Panel:  Recent Labs Lab 05/17/15 1326  NA 142  K 4.1  CL 105  CO2 26  GLUCOSE 141*  BUN 16  CREATININE 0.99  CALCIUM 9.1   Liver Function Tests: No results for input(s): AST, ALT, ALKPHOS, BILITOT, PROT, ALBUMIN in the last 168 hours. No results for input(s): LIPASE, AMYLASE in the last 168 hours. No results for input(s): AMMONIA in the last 168 hours. CBC:  Recent Labs Lab 05/17/15 1326  WBC 10.7*  NEUTROABS 7.1  HGB 14.2  HCT 43.1  MCV 90.0  PLT 315   Cardiac Enzymes: No results for input(s): CKTOTAL, CKMB, CKMBINDEX, TROPONINI in the last 168 hours.  BNP (last 3 results) No results for input(s): BNP in the last 8760 hours.  ProBNP (last 3 results) No results for input(s): PROBNP in the last 8760 hours.  CBG: No results for input(s): GLUCAP in the last 168 hours.  Radiological Exams on Admission: No results found.   Assessment/Plan Principal Problem:   Cellulitis of right leg - place routine diabetic cellulitis order set - Cover with Vanc and zosyn - doppler neg for DVT  Active Problems:   Diabetes mellitus with hyperglycemia ([place]) - SSI - diabetic diet  Code Status:   full DVT Prophylaxis: Heparin Family Communication: d/c patient directly Disposition Plan: Pending improvement in condition. Most likely d/c in next 1-2 days on oral antibiotics  Time spent: > 45 minutes  Velvet Bathe [facility] Pager [phone]

## 2015-05-17 NOTE — ED Provider Notes (Signed)
CSN: [id number]     Arrival date & time 05/17/15  1222 History   First MD Initiated Contact with Patient 05/17/15 1255     Chief Complaint  Patient presents with  . Leg Infection      (Consider location/radiation/quality/duration/timing/severity/associated sxs/prior Treatment) HPI Comments: The patient is a 54 year old male, he has a known history of diabetes as well as obesity, he presents with right lower extremity swelling redness and weeping and slight tenderness to the right lower extremity. He has been wearing compression stockings, no new topical medications, he denies fevers chills nausea vomiting or any other symptoms. He went to the wound care center this morning because of this increased weeping of the leg and was immediately referred to the hospital for treatment for likely cellulitis and likely admission to the hospital. The symptoms are persistent, gradually worsening, he received topical treatment and dressing prior to arrival.  The history is provided by the patient.    Past Medical History  Diagnosis Date  . Obesity   . Pulmonary embolism ([place]) 05/04/2012    bilaterally  . Exertional dyspnea 05/04/2012    "isolated episode" (05/05/2012)  . Sleep apnea     "pretty sure; never been tested" (05/05/2012)  . History of chickenpox   . DVT (deep venous thrombosis) ([place])   . Diabetes mellitus, type II ([place]) 02/2013  . Varicose veins    Past Surgical History  Procedure Laterality Date  . No past surgeries    . Laparotomy N/A 12/13/2013    Procedure: EXPLORATORY LAPAROTOMY Repair ventral hernia, without mesh, partial omentectomy;  Surgeon: Gwenyth Ober, MD;  Location: [facility] OR;  Service: General;  Laterality: N/A;   Family History  Problem Relation Age of Onset  . Diabetes Mother   . Hypertension Mother    Social History  Substance Use Topics  . Smoking status: Never Smoker   . Smokeless tobacco: Never Used     Comment: never used tobacco  . Alcohol Use: No     Review of Systems  All other systems reviewed and are negative.     Allergies  Review of patient's allergies indicates no known allergies.  Home Medications   Prior to Admission medications   Medication Sig Start Date End Date Taking? Authorizing Provider  aspirin EC 81 MG tablet Take 81 mg by mouth 2 (two) times daily.   Yes Historical Provider, MD  Blood Glucose Monitoring Suppl (ACCU-CHEK AVIVA PLUS) W/DEVICE KIT Use as directed. 03/08/14  Yes Zenaida Niece, PA-C  glucose blood (ACCU-CHEK AVIVA PLUS) test strip Test daily. 03/08/14  Yes Zenaida Niece, PA-C  ibuprofen (ADVIL,MOTRIN) 600 MG tablet Take 600 mg by mouth every 6 (six) hours as needed for moderate pain.   Yes Historical Provider, MD  Lancets (ACCU-CHEK SOFT TOUCH) lancets Test daily. 03/08/14  Yes Zenaida Niece, PA-C  metFORMIN (GLUCOPHAGE) 500 MG tablet TAKE 1 TABLET (500 MG TOTAL) BY MOUTH 2 (TWO) TIMES DAILY WITH A MEAL. 04/10/15  Yes Doe-Hyun Kyra Searles, DO  Multiple Vitamin (MULTIVITAMIN WITH MINERALS) TABS tablet Take 1 tablet by mouth daily.    Yes Historical Provider, MD  furosemide (LASIX) 40 MG tablet Take 1 tablet (40 mg total) by mouth daily. Patient not taking: Reported on 05/17/2015 12/20/14   Dorothyann Peng, NP   BP 147/90 mmHg  Pulse 97  Temp(Src) 97.9 F (36.6 C) (Oral)  Resp 18  SpO2 99% Physical Exam  Constitutional: He appears well-developed and well-nourished. No distress.  HENT:  Head: Normocephalic and atraumatic.  Mouth/Throat: Oropharynx is clear and moist. No oropharyngeal exudate.  Eyes: Conjunctivae and EOM are normal. Pupils are equal, round, and reactive to light. Right eye exhibits no discharge. Left eye exhibits no discharge. No scleral icterus.  Neck: Normal range of motion. Neck supple. No JVD present. No thyromegaly present.  Cardiovascular: Normal rate, regular rhythm, normal heart sounds and intact distal pulses.  Exam reveals no gallop and no friction rub.   No murmur  heard. Pulmonary/Chest: Effort normal and breath sounds normal. No respiratory distress. He has no wheezes. He has no rales.  Abdominal: Soft. Bowel sounds are normal. He exhibits no distension and no mass. There is no tenderness.  Musculoskeletal: Normal range of motion. He exhibits edema. He exhibits no tenderness.  Severe unilateral swelling of the right lower extremity, there is diffuse erythema, there is slight weeping, compared to the left side  Lymphadenopathy:    He has no cervical adenopathy.  Neurological: He is alert. Coordination normal.  Skin: Skin is warm and dry. No rash noted. No erythema.  Psychiatric: He has a normal mood and affect. His behavior is normal.  Nursing note and vitals reviewed.   ED Course  Procedures (including critical care time) Labs Review Labs Reviewed  CBC WITH DIFFERENTIAL/PLATELET - Abnormal; Notable for the following:    WBC 10.7 (*)    All other components within normal limits  BASIC METABOLIC PANEL - Abnormal; Notable for the following:    Glucose, Bld 141 (*)    All other components within normal limits    Imaging Review No results found. I have personally reviewed and evaluated these images and lab results as part of my medical decision-making.    MDM   Final diagnoses:  Cellulitis of right lower extremity    The patient does not appear toxic, his vital signs suggest a borderline tachycardia however his leg does look like it is severely erythematous, weeping, suspect some component of infection, due to his history of DVT we'll obtain ultrasound, admitted to the hospital. The patient is in agreement.  Labs unremarkable  D/w Dr. Wendee Beavers for admission.  Meds given in ED:  Medications  cefTRIAXone (ROCEPHIN) 1 g in dextrose 5 % 50 mL IVPB (0 g Intravenous Stopped 05/17/15 1409)      Noemi Chapel, MD 05/17/15 1428

## 2015-05-17 NOTE — Progress Notes (Signed)
RN received report from [employer], Pt arrived unit from ED. Pt is alert and oriented, able to communicate needs. MD notified of Pt's location. Will continue with current plan of care.

## 2015-05-17 NOTE — ED Notes (Addendum)
Hx of DVT. Pt reports slight right lower leg swelling starting Friday 12/16, swelling increased by Sunday, called wound center on Monday, got an appt for Wednesday. Pt was sent over by wound center for IV abx. Pt has swelling and redness to right lower leg up to knee. Denies pain. Denies SOB.

## 2015-05-17 NOTE — ED Notes (Signed)
Patient presents with bilateral lower extremity edema that has been ongoing for several months.  Patient is being followed by wound management.  Patient states RLE became increasingly red and edematous last week and acutely so on Friday.  Patient states the leg began weeping clear fluid over the weekend and he called wound management and went there today.  Patient was sent to ED for IV abx.  Patient denies pain in RLE, N/V/D and fever.  Patient's RLE is erythematous and edematous from knee to toes, warm to touch, pulse difficult to palpate.

## 2015-05-17 NOTE — Progress Notes (Signed)
*  Preliminary Results* Right lower extremity venous duplex completed. Study was very technically limited due to patient body habitus, depth of vessels, edema, and patient's inability to tolerate compression maneuvers. Visualized veins of the right lower extremity are patent by color flow doppler with no obvious evidence of deep vein thrombosis. There is no evidence of right Baker's cyst.  05/17/2015 2:00 PM  Maudry Mayhew, RVT, RDCS, RDMS

## 2015-05-17 NOTE — Progress Notes (Signed)
ANTIBIOTIC CONSULT NOTE - INITIAL  Pharmacy Consult for vancomycin/Zosyn Indication: cellulitis  No Known Allergies  Patient Measurements: Height: 6\' 3"  (190.5 cm) Weight: (!) 367 lb 3.2 oz (166.561 kg) IBW/kg (Calculated) : 84.5 Adjusted Body Weight:   Vital Signs: Temp: 97.7 F (36.5 C) (12/21 1518) Temp Source: Oral (12/21 1518) BP: 162/90 mmHg (12/21 1518) Pulse Rate: 83 (12/21 1518) Intake/Output from previous day:   Intake/Output from this shift: Total I/O In: 120 [P.O.:120] Out: 400 [Urine:400]  Labs:  Recent Labs  05/17/15 1326  WBC 10.7*  HGB 14.2  PLT 315  CREATININE 0.99   Estimated Creatinine Clearance: 138.2 mL/min (by C-G formula based on Cr of 0.99). No results for input(s): VANCOTROUGH, VANCOPEAK, VANCORANDOM, GENTTROUGH, GENTPEAK, GENTRANDOM, TOBRATROUGH, TOBRAPEAK, TOBRARND, AMIKACINPEAK, AMIKACINTROU, AMIKACIN in the last 72 hours.   Microbiology: No results found for this or any previous visit (from the past 720 hour(s)).  Medical History: Past Medical History  Diagnosis Date  . Obesity   . Pulmonary embolism ([place]) 05/04/2012    bilaterally  . Exertional dyspnea 05/04/2012    "isolated episode" (05/05/2012)  . Sleep apnea     "pretty sure; never been tested" (05/05/2012)  . History of chickenpox   . DVT (deep venous thrombosis) ([place])   . Diabetes mellitus, type II ([place]) 02/2013  . Varicose veins     Medications:  Scheduled:  . aspirin EC  81 mg Oral BID  . heparin  5,000 Units Subcutaneous 3 times per day  . insulin aspart  0-15 Units Subcutaneous TID WC  . insulin aspart  0-5 Units Subcutaneous QHS  . multivitamin with minerals  1 tablet Oral Daily  . piperacillin-tazobactam (ZOSYN)  IV  3.375 g Intravenous Q8H  . sodium chloride  3 mL Intravenous Q12H  . [START ON 05/18/2015] vancomycin  1,250 mg Intravenous Q12H  . vancomycin  1,500 mg Intravenous Once  . vancomycin  1,000 mg Intravenous Once   Assessment:   Pt is a 54  y.o. male with history diabetes prior left lower extremity DVT who is presenting with right lower extremity swelling pain and erythema. This is been going on for the last 5 days before the last 3 days has gotten worse. Patient denies trying any oral antibiotics for improvement of his right lower extremity cellulitis. He was going to wound care and they indicated he come to the hospital for further evaluation. The patient denies any fevers or chills. The problem has been persistent since onset has been progressively getting worse.   Goal of Therapy:  Vancomycin trough level 15-20 mcg/ml  Plan:  Measure antibiotic drug levels at steady state  Zosyn 3.375 gr IV q8h EI Vancomycin 2500 mg total load, then vancomycin 1250 mg IV q12h.     Royetta Asal, PharmD, BCPS Pager [phone] 05/17/2015 5:50 PM

## 2015-05-18 DIAGNOSIS — L03115 Cellulitis of right lower limb: Secondary | ICD-10-CM | POA: Diagnosis not present

## 2015-05-18 LAB — CBC
HCT: 39.7 % (ref 39.0–52.0)
Hemoglobin: 12.5 g/dL — ABNORMAL LOW (ref 13.0–17.0)
MCH: 28.2 pg (ref 26.0–34.0)
MCHC: 31.5 g/dL (ref 30.0–36.0)
MCV: 89.4 fL (ref 78.0–100.0)
Platelets: 273 10*3/uL (ref 150–400)
RBC: 4.44 MIL/uL (ref 4.22–5.81)
RDW: 14.3 % (ref 11.5–15.5)
WBC: 10.1 10*3/uL (ref 4.0–10.5)

## 2015-05-18 LAB — BASIC METABOLIC PANEL
Anion gap: 9 (ref 5–15)
BUN: 17 mg/dL (ref 6–20)
CO2: 25 mmol/L (ref 22–32)
Calcium: 8.6 mg/dL — ABNORMAL LOW (ref 8.9–10.3)
Chloride: 106 mmol/L (ref 101–111)
Creatinine, Ser: 1.03 mg/dL (ref 0.61–1.24)
GFR calc Af Amer: >60 mL/min (ref >60)
GFR calc non Af Amer: >60 mL/min (ref >60)
Glucose, Bld: 170 mg/dL — ABNORMAL HIGH (ref 65–99)
Potassium: 4.1 mmol/L (ref 3.5–5.1)
Sodium: 140 mmol/L (ref 135–145)

## 2015-05-18 LAB — GLUCOSE, CAPILLARY
Glucose-Capillary: 187 mg/dL — ABNORMAL HIGH (ref 65–99)
Glucose-Capillary: 155 mg/dL — ABNORMAL HIGH (ref 65–99)

## 2015-05-18 LAB — HIV ANTIBODY (ROUTINE TESTING W REFLEX): HIV Screen 4th Generation wRfx: NONREACTIVE

## 2015-05-18 MED ORDER — DOXYCYCLINE HYCLATE 100 MG PO CAPS: 100.0000 mg | ORAL_CAPSULE | Freq: Two times a day (BID) | ORAL | Status: DC

## 2015-05-18 MED ORDER — CEPHALEXIN 500 MG PO CAPS: 500.0000 mg | ORAL_CAPSULE | Freq: Three times a day (TID) | ORAL | Status: DC

## 2015-05-18 NOTE — Progress Notes (Signed)
Pt VSS. IV removed. Reviewed D/C summary at bedside w/ family present. Printed prescriptions provided. No further questions.

## 2015-05-18 NOTE — Discharge Summary (Signed)
Physician Discharge Summary  Evan Leonard OAC:[id number] DOB: 05-23-1961 DOA: 05/17/2015  PCP: Drema Pry, DO  Admit date: 05/17/2015 Discharge date: 05/18/2015  Time spent: > 35 minutes  Recommendations for Outpatient Follow-up:  1. Patient has been recommended   Discharge Diagnoses:  Principal Problem:   Cellulitis of right leg Active Problems:   Diabetes mellitus with hyperglycemia ([place])   Discharge Condition: stable  Diet recommendation: Carb modified diet  Filed Weights   05/17/15 1700  Weight: 166.561 kg (367 lb 3.2 oz)    History of present illness:  Patient is a 54 year old with history of diabetes presenting with right lower extremity cellulitis  Hospital Course:  Right lower extremity cellulitis - Will discharge on doxycycline and Keflex to treat for the next 10 days - Patient is to follow-up with his primary care physician for further evaluation recommendations  Diabetes mellitus -Patient to continue home medication regimen. He is to check his blood sugars at least 2 times daily once fasting and once postprandial  Procedures:  Right lower extremity ultrasound: No reported DVTs  Consultations:  None  Discharge Exam: Filed Vitals:   05/17/15 2111 05/18/15 0523  BP: 131/84 120/70  Pulse: 98 80  Temp: 97.9 F (36.6 C) 97.7 F (36.5 C)  Resp: 20 18    General: Patient in no acute distress, alert and awake Cardiovascular: Regular rate and rhythm, no murmurs or rubs Respiratory: Clear to auscultation bilaterally, no wheezes  Discharge Instructions   Discharge Instructions    Call MD for:  difficulty breathing, headache or visual disturbances    Complete by:  As directed      Call MD for:  severe uncontrolled pain    Complete by:  As directed      Call MD for:  temperature >100.4    Complete by:  As directed      Diet - low sodium heart healthy    Complete by:  As directed      Discharge instructions    Complete by:  As directed   Please  be sure to follow up with your primary care physician in 1-2 weeks or sooner should any new concerns arise.     Increase activity slowly    Complete by:  As directed           Current Discharge Medication List    START taking these medications   Details  cephALEXin (KEFLEX) 500 MG capsule Take 1 capsule (500 mg total) by mouth 3 (three) times daily. Qty: 30 capsule, Refills: 0    doxycycline (VIBRAMYCIN) 100 MG capsule Take 1 capsule (100 mg total) by mouth 2 (two) times daily. Qty: 20 capsule, Refills: 0      CONTINUE these medications which have NOT CHANGED   Details  aspirin EC 81 MG tablet Take 81 mg by mouth 2 (two) times daily.    Blood Glucose Monitoring Suppl (ACCU-CHEK AVIVA PLUS) W/DEVICE KIT Use as directed. Qty: 1 kit, Refills: 0    glucose blood (ACCU-CHEK AVIVA PLUS) test strip Test daily. Qty: 100 each, Refills: 12    Lancets (ACCU-CHEK SOFT TOUCH) lancets Test daily. Qty: 100 each, Refills: 12    metFORMIN (GLUCOPHAGE) 500 MG tablet TAKE 1 TABLET (500 MG TOTAL) BY MOUTH 2 (TWO) TIMES DAILY WITH A MEAL. Qty: 60 tablet, Refills: 5    Multiple Vitamin (MULTIVITAMIN WITH MINERALS) TABS tablet Take 1 tablet by mouth daily.       STOP taking these medications     ibuprofen (  ADVIL,MOTRIN) 600 MG tablet      furosemide (LASIX) 40 MG tablet        No Known Allergies    The results of significant diagnostics from this hospitalization (including imaging, microbiology, ancillary and laboratory) are listed below for reference.    Significant Diagnostic Studies: No results found.  Microbiology: Recent Results (from the past 240 hour(s))  Culture, blood (routine x 2)     Status: None (Preliminary result)   Collection Time: 05/17/15  5:09 PM  Result Value Ref Range Status   Specimen Description BLOOD RIGHT ARM  Final   Special Requests BOTTLES DRAWN AEROBIC AND ANAEROBIC 10CC  Final   Culture PENDING  Incomplete   Report Status PENDING  Incomplete      Labs: Basic Metabolic Panel:  Recent Labs Lab 05/17/15 1326 05/18/15 0450  NA 142 140  K 4.1 4.1  CL 105 106  CO2 26 25  GLUCOSE 141* 170*  BUN 16 17  CREATININE 0.99 1.03  CALCIUM 9.1 8.6*   Liver Function Tests: No results for input(s): AST, ALT, ALKPHOS, BILITOT, PROT, ALBUMIN in the last 168 hours. No results for input(s): LIPASE, AMYLASE in the last 168 hours. No results for input(s): AMMONIA in the last 168 hours. CBC:  Recent Labs Lab 05/17/15 1326 05/18/15 0450  WBC 10.7* 10.1  NEUTROABS 7.1  --   HGB 14.2 12.5*  HCT 43.1 39.7  MCV 90.0 89.4  PLT 315 273   Cardiac Enzymes: No results for input(s): CKTOTAL, CKMB, CKMBINDEX, TROPONINI in the last 168 hours. BNP: BNP (last 3 results) No results for input(s): BNP in the last 8760 hours.  ProBNP (last 3 results) No results for input(s): PROBNP in the last 8760 hours.  CBG:  Recent Labs Lab 05/17/15 1624 05/17/15 2109 05/18/15 0740 05/18/15 1108  GLUCAP 98 133* 187* 155*    Signed:  Velvet Bathe MD  FACP  [facility] 05/18/2015, 1:48 PM

## 2015-05-22 LAB — CULTURE, BLOOD (ROUTINE X 2)
Culture: NO GROWTH
Culture: NO GROWTH

## 2015-05-30 ENCOUNTER — Emergency Department (HOSPITAL_COMMUNITY): Payer: PRIVATE HEALTH INSURANCE

## 2015-05-30 ENCOUNTER — Encounter (HOSPITAL_COMMUNITY): Payer: Self-pay | Admitting: Emergency Medicine

## 2015-05-30 ENCOUNTER — Emergency Department (HOSPITAL_COMMUNITY)
Admission: EM | Admit: 2015-05-30 | Discharge: 2015-05-31 | Disposition: A | Discharge status: Home / Self Care | Payer: PRIVATE HEALTH INSURANCE | Attending: Emergency Medicine | Admitting: Emergency Medicine

## 2015-05-30 DIAGNOSIS — Z8619 Personal history of other infectious and parasitic diseases: Secondary | ICD-10-CM | POA: Diagnosis not present

## 2015-05-30 DIAGNOSIS — E669 Obesity, unspecified: Secondary | ICD-10-CM | POA: Insufficient documentation

## 2015-05-30 DIAGNOSIS — R110 Nausea: Secondary | ICD-10-CM | POA: Diagnosis present

## 2015-05-30 DIAGNOSIS — R000 Tachycardia, unspecified: Secondary | ICD-10-CM | POA: Diagnosis not present

## 2015-05-30 DIAGNOSIS — R630 Anorexia: Secondary | ICD-10-CM | POA: Diagnosis not present

## 2015-05-30 DIAGNOSIS — Z7982 Long term (current) use of aspirin: Secondary | ICD-10-CM | POA: Diagnosis not present

## 2015-05-30 DIAGNOSIS — R1010 Upper abdominal pain, unspecified: Secondary | ICD-10-CM | POA: Insufficient documentation

## 2015-05-30 DIAGNOSIS — Z8669 Personal history of other diseases of the nervous system and sense organs: Secondary | ICD-10-CM | POA: Insufficient documentation

## 2015-05-30 DIAGNOSIS — E119 Type 2 diabetes mellitus without complications: Secondary | ICD-10-CM | POA: Insufficient documentation

## 2015-05-30 DIAGNOSIS — R5383 Other fatigue: Secondary | ICD-10-CM | POA: Insufficient documentation

## 2015-05-30 DIAGNOSIS — R112 Nausea with vomiting, unspecified: Secondary | ICD-10-CM | POA: Diagnosis not present

## 2015-05-30 DIAGNOSIS — Z79899 Other long term (current) drug therapy: Secondary | ICD-10-CM | POA: Diagnosis not present

## 2015-05-30 DIAGNOSIS — R2243 Localized swelling, mass and lump, lower limb, bilateral: Secondary | ICD-10-CM | POA: Diagnosis not present

## 2015-05-30 DIAGNOSIS — K429 Umbilical hernia without obstruction or gangrene: Secondary | ICD-10-CM | POA: Insufficient documentation

## 2015-05-30 DIAGNOSIS — Z792 Long term (current) use of antibiotics: Secondary | ICD-10-CM | POA: Diagnosis not present

## 2015-05-30 DIAGNOSIS — Z86718 Personal history of other venous thrombosis and embolism: Secondary | ICD-10-CM | POA: Insufficient documentation

## 2015-05-30 DIAGNOSIS — Z7984 Long term (current) use of oral hypoglycemic drugs: Secondary | ICD-10-CM | POA: Insufficient documentation

## 2015-05-30 DIAGNOSIS — Z86711 Personal history of pulmonary embolism: Secondary | ICD-10-CM | POA: Insufficient documentation

## 2015-05-30 LAB — COMPREHENSIVE METABOLIC PANEL
ALT: 38 U/L (ref 17–63)
AST: 41 U/L (ref 15–41)
Albumin: 3.4 g/dL — ABNORMAL LOW (ref 3.5–5.0)
Alkaline Phosphatase: 72 U/L (ref 38–126)
Anion gap: 12 (ref 5–15)
BUN: 15 mg/dL (ref 6–20)
CO2: 24 mmol/L (ref 22–32)
Calcium: 9.3 mg/dL (ref 8.9–10.3)
Chloride: 98 mmol/L — ABNORMAL LOW (ref 101–111)
Creatinine, Ser: 1.22 mg/dL (ref 0.61–1.24)
GFR calc Af Amer: >60 mL/min (ref >60)
GFR calc non Af Amer: >60 mL/min (ref >60)
Glucose, Bld: 136 mg/dL — ABNORMAL HIGH (ref 65–99)
Potassium: 4.7 mmol/L (ref 3.5–5.1)
Sodium: 134 mmol/L — ABNORMAL LOW (ref 135–145)
Total Bilirubin: 1.9 mg/dL — ABNORMAL HIGH (ref 0.3–1.2)
Total Protein: 7.9 g/dL (ref 6.5–8.1)

## 2015-05-30 LAB — URINALYSIS, ROUTINE W REFLEX MICROSCOPIC
Glucose, UA: NEGATIVE mg/dL
Ketones, ur: 15 mg/dL — AB
Nitrite: NEGATIVE
Protein, ur: NEGATIVE mg/dL
Specific Gravity, Urine: 1.023 (ref 1.005–1.030)
pH: 5 (ref 5.0–8.0)

## 2015-05-30 LAB — URINE MICROSCOPIC-ADD ON

## 2015-05-30 LAB — CBC
HCT: 43 % (ref 39.0–52.0)
Hemoglobin: 14.4 g/dL (ref 13.0–17.0)
MCH: 29.6 pg (ref 26.0–34.0)
MCHC: 33.5 g/dL (ref 30.0–36.0)
MCV: 88.3 fL (ref 78.0–100.0)
Platelets: 241 10*3/uL (ref 150–400)
RBC: 4.87 MIL/uL (ref 4.22–5.81)
RDW: 14.7 % (ref 11.5–15.5)
WBC: 16.3 10*3/uL — ABNORMAL HIGH (ref 4.0–10.5)

## 2015-05-30 LAB — I-STAT CG4 LACTIC ACID, ED
Lactic Acid, Venous: 1.91 mmol/L (ref 0.5–2.0)
Lactic Acid, Venous: 1.48 mmol/L (ref 0.5–2.0)

## 2015-05-30 LAB — LIPASE, BLOOD: Lipase: 25 U/L (ref 11–51)

## 2015-05-30 MED ORDER — ONDANSETRON 4 MG PO TBDP
4.0000 mg | ORAL_TABLET | Freq: Once | ORAL | Status: AC | PRN
  Administered 2015-05-30: 4 mg via ORAL

## 2015-05-30 MED ORDER — ONDANSETRON 4 MG PO TBDP
ORAL_TABLET | ORAL | Status: AC
  Filled 2015-05-30: qty 1

## 2015-05-30 MED ORDER — ONDANSETRON HCL 4 MG/2ML IJ SOLN
4.0000 mg | Freq: Once | INTRAMUSCULAR | Status: AC
  Administered 2015-05-30: 4 mg via INTRAVENOUS
  Filled 2015-05-30: qty 2

## 2015-05-30 MED ORDER — SODIUM CHLORIDE 0.9 % IV BOLUS (SEPSIS)
1000.0000 mL | Freq: Once | INTRAVENOUS | Status: AC
  Administered 2015-05-30: 1000 mL via INTRAVENOUS

## 2015-05-30 MED ORDER — IOHEXOL 300 MG/ML  SOLN
100.0000 mL | Freq: Once | INTRAMUSCULAR | Status: AC | PRN
  Administered 2015-05-30: 100 mL via INTRAVENOUS

## 2015-05-30 NOTE — Discharge Instructions (Signed)
Hernia, Adult A hernia is the bulging of an organ or tissue through a weak spot in the muscles of the abdomen (abdominal wall). Hernias develop most often near the navel or groin. There are many kinds of hernias. Common kinds include:  Femoral hernia. This kind of hernia develops under the groin in the upper thigh area.  Inguinal hernia. This kind of hernia develops in the groin or scrotum.  Umbilical hernia. This kind of hernia develops near the navel.  Hiatal hernia. This kind of hernia causes part of the stomach to be pushed up into the chest.  Incisional hernia. This kind of hernia bulges through a scar from an abdominal surgery. CAUSES This condition may be caused by:  Heavy lifting.  Coughing over a long period of time.  Straining to have a bowel movement.  An incision made during an abdominal surgery.  A birth defect (congenital defect).  Excess weight or obesity.  Smoking.  Poor nutrition.  Cystic fibrosis.  Excess fluid in the abdomen.  Undescended testicles. SYMPTOMS Symptoms of a hernia include:  A lump on the abdomen. This is the first sign of a hernia. The lump may become more obvious with standing, straining, or coughing. It may get bigger over time if it is not treated or if the condition causing it is not treated.  Pain. A hernia is usually painless, but it may become painful over time if treatment is delayed. The pain is usually dull and may get worse with standing or lifting heavy objects. Sometimes a hernia gets tightly squeezed in the weak spot (strangulated) or stuck there (incarcerated) and causes additional symptoms. These symptoms may include:  Vomiting.  Nausea.  Constipation.  Irritability. DIAGNOSIS A hernia may be diagnosed with:  A physical exam. During the exam your health care provider may ask you to cough or to make a specific movement, because a hernia is usually more visible when you move.  Imaging tests. These can  include:  X-rays.  Ultrasound.  CT scan. TREATMENT A hernia that is small and painless may not need to be treated. A hernia that is large or painful may be treated with surgery. Inguinal hernias may be treated with surgery to prevent incarceration or strangulation. Strangulated hernias are always treated with surgery, because lack of blood to the trapped organ or tissue can cause it to die. Surgery to treat a hernia involves pushing the bulge back into place and repairing the weak part of the abdomen. HOME CARE INSTRUCTIONS  Avoid straining.  Do not lift anything heavier than 10 lb (4.5 kg).  Lift with your leg muscles, not your back muscles. This helps avoid strain.  When coughing, try to cough gently.  Prevent constipation. Constipation leads to straining with bowel movements, which can make a hernia worse or cause a hernia repair to break down. You can prevent constipation by:  Eating a high-fiber diet that includes plenty of fruits and vegetables.  Drinking enough fluids to keep your urine clear or pale yellow. Aim to drink 6-8 glasses of water per day.  Using a stool softener as directed by your health care provider.  Lose weight, if you are overweight.  Do not use any tobacco products, including cigarettes, chewing tobacco, or electronic cigarettes. If you need help quitting, ask your health care provider.  Keep all follow-up visits as directed by your health care provider. This is important. Your health care provider may need to monitor your condition. SEEK MEDICAL CARE IF:  You have   swelling, redness, and pain in the affected area.  Your bowel habits change. SEEK IMMEDIATE MEDICAL CARE IF:  You have a fever.  You have abdominal pain that is getting worse.  You feel nauseous or you vomit.  You cannot push the hernia back in place by gently pressing on it while you are lying down.  The hernia:  Changes in shape or size.  Is stuck outside the  abdomen.  Becomes discolored.  Feels hard or tender.   This information is not intended to replace advice given to you by your health care provider. Make sure you discuss any questions you have with your health care provider.   Document Released: 05/13/2005 Document Revised: 06/03/2014 Document Reviewed: 03/23/2014 Elsevier Interactive Patient Education 2016 [employer]  

## 2015-05-30 NOTE — ED Notes (Signed)
Provider at bedside

## 2015-05-30 NOTE — ED Provider Notes (Signed)
CSN: [id number]     Arrival date & time 05/30/15  1503 History   First MD Initiated Contact with Patient 05/30/15 1921     Chief Complaint  Patient presents with  . Nausea  . Leg Pain      Patient is a 55 y.o. male presenting with leg pain. The history is provided by the patient.  Leg Pain Associated symptoms: fatigue   Associated symptoms: no back pain    patient presents with nausea and upper abdominal pain. States she's felt bad for last couple days. No vomiting. Has had upper abdominal pain. States abdomen feels slightly larger than normal. Also has increased pain in both his legs. Has a history of cellulitis on his legs and states he has some improving cellulitis on his right leg. The pain in his abdomen is dull and constant. No vomiting but has had nausea. No sick contacts.  Past Medical History  Diagnosis Date  . Obesity   . Pulmonary embolism ([place]) 05/04/2012    bilaterally  . Exertional dyspnea 05/04/2012    "isolated episode" (05/05/2012)  . Sleep apnea     "pretty sure; never been tested" (05/05/2012)  . History of chickenpox   . DVT (deep venous thrombosis) ([place])   . Diabetes mellitus, type II ([place]) 02/2013  . Varicose veins    Past Surgical History  Procedure Laterality Date  . No past surgeries    . Laparotomy N/A 12/13/2013    Procedure: EXPLORATORY LAPAROTOMY Repair ventral hernia, without mesh, partial omentectomy;  Surgeon: Gwenyth Ober, MD;  Location: [facility] OR;  Service: General;  Laterality: N/A;   Family History  Problem Relation Age of Onset  . Diabetes Mother   . Hypertension Mother    Social History  Substance Use Topics  . Smoking status: Never Smoker   . Smokeless tobacco: Never Used     Comment: never used tobacco  . Alcohol Use: No    Review of Systems  Constitutional: Positive for appetite change and fatigue. Negative for activity change.  Eyes: Negative for pain.  Respiratory: Negative for chest tightness and shortness of breath.    Cardiovascular: Positive for leg swelling. Negative for chest pain.  Gastrointestinal: Positive for abdominal pain. Negative for nausea, vomiting and diarrhea.  Genitourinary: Negative for flank pain.  Musculoskeletal: Negative for back pain and neck stiffness.  Skin: Positive for wound. Negative for rash.  Neurological: Negative for weakness, numbness and headaches.  Psychiatric/Behavioral: Negative for behavioral problems.      Allergies  Review of patient's allergies indicates no known allergies.  Home Medications   Prior to Admission medications   Medication Sig Start Date End Date Taking? Authorizing Provider  aspirin EC 81 MG tablet Take 81 mg by mouth 2 (two) times daily.   Yes Historical Provider, MD  cephALEXin (KEFLEX) 500 MG capsule Take 1 capsule (500 mg total) by mouth 3 (three) times daily. 05/18/15  Yes Velvet Bathe, MD  doxycycline (VIBRAMYCIN) 100 MG capsule Take 1 capsule (100 mg total) by mouth 2 (two) times daily. 05/18/15  Yes Velvet Bathe, MD  metFORMIN (GLUCOPHAGE) 500 MG tablet TAKE 1 TABLET (500 MG TOTAL) BY MOUTH 2 (TWO) TIMES DAILY WITH A MEAL. 04/10/15  Yes Doe-Hyun Kyra Searles, DO  Multiple Vitamin (MULTIVITAMIN WITH MINERALS) TABS tablet Take 1 tablet by mouth daily.    Yes Historical Provider, MD   BP 139/78 mmHg  Pulse 95  Temp(Src) 99.6 F (37.6 C) (Oral)  Resp 18  SpO2 99% Physical  Exam  Constitutional: He appears well-developed.  HENT:  Head: Atraumatic.  Neck: Neck supple.  Cardiovascular:   Mild tachycardia  Pulmonary/Chest: Effort normal.  Abdominal: He exhibits distension.   Mild distention. Horizontal scar infraumbilically from previous hernia repair. No hernias palpated. Mild upper abdominal tenderness without rebound or guarding.  Skin: Skin is warm.   Chronic venous changes in both lower extremities. Worse on right but does not appear currently infected.    ED Course  Procedures (including critical care time) Labs Review Labs  Reviewed  COMPREHENSIVE METABOLIC PANEL - Abnormal; Notable for the following:    Sodium 134 (*)    Chloride 98 (*)    Glucose, Bld 136 (*)    Albumin 3.4 (*)    Total Bilirubin 1.9 (*)    All other components within normal limits  CBC - Abnormal; Notable for the following:    WBC 16.3 (*)    All other components within normal limits  URINALYSIS, ROUTINE W REFLEX MICROSCOPIC (NOT AT [facility]) - Abnormal; Notable for the following:    Color, Urine ORANGE (*)    APPearance CLOUDY (*)    Hgb urine dipstick SMALL (*)    Bilirubin Urine SMALL (*)    Ketones, ur 15 (*)    Leukocytes, UA SMALL (*)    All other components within normal limits  URINE MICROSCOPIC-ADD ON - Abnormal; Notable for the following:    Squamous Epithelial / LPF 0-5 (*)    Bacteria, UA RARE (*)    Casts HYALINE CASTS (*)    All other components within normal limits  LIPASE, BLOOD  I-STAT CG4 LACTIC ACID, ED  I-STAT CG4 LACTIC ACID, ED    Imaging Review Ct Abdomen Pelvis W Contrast  05/30/2015  CLINICAL DATA:  Left-sided abdominal pain. EXAM: CT ABDOMEN AND PELVIS WITH CONTRAST TECHNIQUE: Multidetector CT imaging of the abdomen and pelvis was performed using the standard protocol following bolus administration of intravenous contrast. CONTRAST:  190mL OMNIPAQUE IOHEXOL 300 MG/ML  SOLN COMPARISON:  CT 12/10/2013 FINDINGS: Lower chest: Lung bases are clear. Hepatobiliary: No focal hepatic lesion. Diffuse low-attenuation within liver suggests hepatic steatosis No biliary duct dilatation. Gallbladder is normal. Common bile duct is normal. Pancreas: Pancreas is normal. No ductal dilatation. No pancreatic inflammation. Spleen: Normal spleen Adrenals/urinary tract: Adrenal glands are normal. Several nonobstructing calculi within the RIGHT kidney range in size from 4 to 8 mm. There is a high-density round lesion extending from lower pole of the RIGHT kidney measuring 20 mm on image 51, series 201. This compares to 20 mm on comparison  exam from 12/10/2013. Small calculi are in the lower pole of the LEFT kidney additionally. No ureterolithiasis or obstructive uropathy. No bladder calculi. Stomach/Bowel: Stomach is normal. Duodenum is normal. There is a ventral abdominal hernia which contains a short segment of small bowel (image 105, series 204; image 75, series 201). This is site of a larger hernia or seen on comparison exam. The small bowel proximal to the hernia is mildly dilated to 38 mm while the small bowel distal to the hernia is collapsed at 10 mm. This obstruction occurs in the mid small bowel. The small bowel is collapsed up to the terminal ileum. The ascending, transverse, and descending colon are collapsed. No intraperitoneal free air.  No pneumatosis. Vascular/Lymphatic: Abdominal aorta is normal caliber. There is no retroperitoneal or periportal lymphadenopathy. No pelvic lymphadenopathy. Reproductive: Prostate normal. Other: No free fluid. Musculoskeletal: No aggressive osseous lesion. IMPRESSION: 1. SMALL BOWEL OBSTRUCTION at  the site of a loop of small bowel entering a recurrent midline abdominal wall hernia. Minimal small bowel dilatation proximal to the obstructing hernia. 2. Hepatic steatosis. 3. Stable indeterminate exophytic RIGHT renal lesion and bilateral renal calculi. Electronically Signed   By: Suzy Bouchard M.D.   On: 05/30/2015 22:47   Dg Abd Acute W/chest  05/30/2015  CLINICAL DATA:  Pt c/o some nausea and vomiting, left side abdominal pain and cramping x 4 days; pt sts "tightness in legs today" and painful to stand. Hx PE in 2013, DVT, diabetic. EXAM: DG ABDOMEN ACUTE W/ 1V CHEST COMPARISON:  12/18/2013 FINDINGS: Shallow lung inflation. Mildly prominent interstitial markings and atelectasis at the left lung base. Dilated loops of small bowel are identified in the central and lower abdomen. Gas-filled large bowel loops are not dilated. Moderate stool burden. No free intraperitoneal air. IMPRESSION: 1. Shallow  lung inflation. 2. Partial or early small bowel obstruction. 3. Moderate stool burden. Electronically Signed   By: Nolon Nations M.D.   On: 05/30/2015 20:54   I have personally reviewed and evaluated these images and lab results as part of my medical decision-making.   EKG Interpretation None      MDM   Final diagnoses:  Recurrent umbilical hernia     patient with nausea vomiting and abdominal pain. Had incarcerated umbilical hernia that it appears to have been able to reduce with some direct pressure. Patient's abdominal pain is improved. He is tolerated orals and had  Still been having bowel movements. Will discharge home to follow-up with general surgery. Has been seen in the ER by Dr. Lu Duffel, MD 05/30/15 [phone]

## 2015-05-30 NOTE — ED Notes (Signed)
Pt sts some nausea and vomiting x several days; pt sts "tightness in legs today" and painful to stand; pt sts currently taking antibiotics for cellulitis to right leg but denies swelling

## 2015-06-15 ENCOUNTER — Emergency Department (HOSPITAL_COMMUNITY)
Admission: EM | Admit: 2015-06-15 | Discharge: 2015-06-15 | Disposition: A | Discharge status: Nursing Home (Includes ICF) | Payer: PRIVATE HEALTH INSURANCE | Source: Home / Self Care | Attending: Family Medicine | Admitting: Family Medicine

## 2015-06-15 ENCOUNTER — Emergency Department (HOSPITAL_COMMUNITY): Payer: PRIVATE HEALTH INSURANCE

## 2015-06-15 ENCOUNTER — Encounter (HOSPITAL_COMMUNITY): Payer: Self-pay | Admitting: *Deleted

## 2015-06-15 ENCOUNTER — Encounter (HOSPITAL_COMMUNITY): Payer: Self-pay

## 2015-06-15 ENCOUNTER — Inpatient Hospital Stay (HOSPITAL_COMMUNITY)
Admission: EM | Admit: 2015-06-15 | Discharge: 2015-06-20 | DRG: 872 | Disposition: A | Discharge status: Home / Self Care | Payer: PRIVATE HEALTH INSURANCE | Attending: Internal Medicine | Admitting: Internal Medicine

## 2015-06-15 DIAGNOSIS — I82402 Acute embolism and thrombosis of unspecified deep veins of left lower extremity: Secondary | ICD-10-CM | POA: Diagnosis present

## 2015-06-15 DIAGNOSIS — I878 Other specified disorders of veins: Secondary | ICD-10-CM | POA: Diagnosis present

## 2015-06-15 DIAGNOSIS — Z6841 Body Mass Index (BMI) 40.0 and over, adult: Secondary | ICD-10-CM

## 2015-06-15 DIAGNOSIS — L0390 Cellulitis, unspecified: Secondary | ICD-10-CM | POA: Diagnosis present

## 2015-06-15 DIAGNOSIS — A419 Sepsis, unspecified organism: Principal | ICD-10-CM | POA: Diagnosis present

## 2015-06-15 DIAGNOSIS — Z86718 Personal history of other venous thrombosis and embolism: Secondary | ICD-10-CM

## 2015-06-15 DIAGNOSIS — Z8249 Family history of ischemic heart disease and other diseases of the circulatory system: Secondary | ICD-10-CM

## 2015-06-15 DIAGNOSIS — G4733 Obstructive sleep apnea (adult) (pediatric): Secondary | ICD-10-CM | POA: Diagnosis present

## 2015-06-15 DIAGNOSIS — Z86711 Personal history of pulmonary embolism: Secondary | ICD-10-CM

## 2015-06-15 DIAGNOSIS — L03115 Cellulitis of right lower limb: Secondary | ICD-10-CM | POA: Diagnosis not present

## 2015-06-15 DIAGNOSIS — A4901 Methicillin susceptible Staphylococcus aureus infection, unspecified site: Secondary | ICD-10-CM | POA: Diagnosis present

## 2015-06-15 DIAGNOSIS — Z8619 Personal history of other infectious and parasitic diseases: Secondary | ICD-10-CM

## 2015-06-15 DIAGNOSIS — E1165 Type 2 diabetes mellitus with hyperglycemia: Secondary | ICD-10-CM | POA: Diagnosis present

## 2015-06-15 DIAGNOSIS — I10 Essential (primary) hypertension: Secondary | ICD-10-CM | POA: Diagnosis present

## 2015-06-15 DIAGNOSIS — I2699 Other pulmonary embolism without acute cor pulmonale: Secondary | ICD-10-CM | POA: Diagnosis present

## 2015-06-15 DIAGNOSIS — Z833 Family history of diabetes mellitus: Secondary | ICD-10-CM

## 2015-06-15 DIAGNOSIS — R600 Localized edema: Secondary | ICD-10-CM

## 2015-06-15 LAB — COMPREHENSIVE METABOLIC PANEL
ALT: 24 U/L (ref 17–63)
AST: 26 U/L (ref 15–41)
Albumin: 3.2 g/dL — ABNORMAL LOW (ref 3.5–5.0)
Alkaline Phosphatase: 86 U/L (ref 38–126)
Anion gap: 10 (ref 5–15)
BUN: 11 mg/dL (ref 6–20)
CO2: 23 mmol/L (ref 22–32)
Calcium: 9.4 mg/dL (ref 8.9–10.3)
Chloride: 107 mmol/L (ref 101–111)
Creatinine, Ser: 1.08 mg/dL (ref 0.61–1.24)
GFR calc Af Amer: >60 mL/min (ref >60)
GFR calc non Af Amer: >60 mL/min (ref >60)
Glucose, Bld: 146 mg/dL — ABNORMAL HIGH (ref 65–99)
Potassium: 4.5 mmol/L (ref 3.5–5.1)
Sodium: 140 mmol/L (ref 135–145)
Total Bilirubin: 0.9 mg/dL (ref 0.3–1.2)
Total Protein: 8 g/dL (ref 6.5–8.1)

## 2015-06-15 LAB — URINALYSIS, ROUTINE W REFLEX MICROSCOPIC
Bilirubin Urine: NEGATIVE
Glucose, UA: NEGATIVE mg/dL
Ketones, ur: NEGATIVE mg/dL
Leukocytes, UA: NEGATIVE
Nitrite: NEGATIVE
Protein, ur: NEGATIVE mg/dL
Specific Gravity, Urine: 1.019 (ref 1.005–1.030)
pH: 5 (ref 5.0–8.0)

## 2015-06-15 LAB — CBC WITH DIFFERENTIAL/PLATELET
Basophils Absolute: 0.1 10*3/uL (ref 0.0–0.1)
Basophils Relative: 0 %
Eosinophils Absolute: 0.5 10*3/uL (ref 0.0–0.7)
Eosinophils Relative: 3 %
HCT: 43.6 % (ref 39.0–52.0)
Hemoglobin: 14.1 g/dL (ref 13.0–17.0)
Lymphocytes Relative: 16 %
Lymphs Abs: 2.5 10*3/uL (ref 0.7–4.0)
MCH: 28.8 pg (ref 26.0–34.0)
MCHC: 32.3 g/dL (ref 30.0–36.0)
MCV: 89.2 fL (ref 78.0–100.0)
Monocytes Absolute: 1.3 10*3/uL — ABNORMAL HIGH (ref 0.1–1.0)
Monocytes Relative: 8 %
Neutro Abs: 11.6 10*3/uL — ABNORMAL HIGH (ref 1.7–7.7)
Neutrophils Relative %: 73 %
Platelets: 336 10*3/uL (ref 150–400)
RBC: 4.89 MIL/uL (ref 4.22–5.81)
RDW: 14.7 % (ref 11.5–15.5)
WBC: 15.8 10*3/uL — ABNORMAL HIGH (ref 4.0–10.5)

## 2015-06-15 LAB — I-STAT CG4 LACTIC ACID, ED
Lactic Acid, Venous: 1.86 mmol/L (ref 0.5–2.0)
Lactic Acid, Venous: 2.16 mmol/L (ref 0.5–2.0)
Lactic Acid, Venous: 2.38 mmol/L (ref 0.5–2.0)

## 2015-06-15 LAB — URINE MICROSCOPIC-ADD ON

## 2015-06-15 MED ORDER — SODIUM CHLORIDE 0.9 % IV BOLUS (SEPSIS)
1000.0000 mL | Freq: Once | INTRAVENOUS | Status: AC
  Administered 2015-06-15: 1000 mL via INTRAVENOUS

## 2015-06-15 MED ORDER — PIPERACILLIN-TAZOBACTAM 3.375 G IVPB 30 MIN
3.3750 g | Freq: Once | INTRAVENOUS | Status: AC
  Administered 2015-06-15: 3.375 g via INTRAVENOUS
  Filled 2015-06-15: qty 50

## 2015-06-15 MED ORDER — PIPERACILLIN-TAZOBACTAM 3.375 G IVPB 30 MIN
3.3750 g | Freq: Once | INTRAVENOUS | Status: DC
Start: 2015-06-16 — End: 2015-06-16
  Filled 2015-06-15: qty 50

## 2015-06-15 NOTE — Discharge Instructions (Signed)
GO TO THE EMERGENCY DEPARTMENT FOR MORE EXTENSIVE WORKUP AND EVALUATION

## 2015-06-15 NOTE — ED Notes (Signed)
Patient complains of having some swelling . Redness,clear drainage from His right leg Patient stated it seems to have gotten worse throughout the day

## 2015-06-15 NOTE — ED Notes (Signed)
Patient being sent to the ed for further evaluation Spoke with chris charge nurse Patient is going via shuttle

## 2015-06-15 NOTE — ED Provider Notes (Signed)
CSN: [id number]     Arrival date & time 06/15/15  1922 History  By signing my name below, I, Evan Leonard, attest that this documentation has been prepared under the direction and in the presence of Quintella Reichert, MD. Electronically Signed: [name], ED Scribe. 06/15/2015. 11:50 PM.    Chief Complaint  Patient presents with  . Leg Pain      The history is provided by the patient. No language interpreter was used.   HPI Comments: Evan Leonard is a 55 y.o. male who has a hx of bilateral PE, DMII and DVT presents to the Emergency Department complaining of sudden onset, gradual worsening right leg pain that started yesterday. Pt notes yesterday his right leg started to swell and have increased erythema. He states that today his leg began to have increased drainage as well which prompted him to come to the ED. He reports he having an infection in his right leg. He states having increased swelling in his right leg once before in the past. Pt currently takes baby aspirin and metformin. Pt denies fever, chest pain, shortness of breath, and hx of heart failure. Past Medical History  Diagnosis Date  . Obesity   . Pulmonary embolism ([place]) 05/04/2012    bilaterally  . Exertional dyspnea 05/04/2012    "isolated episode" (05/05/2012)  . Sleep apnea     "pretty sure; never been tested" (05/05/2012)  . History of chickenpox   . DVT (deep venous thrombosis) ([place])   . Diabetes mellitus, type II ([place]) 02/2013  . Varicose veins    Past Surgical History  Procedure Laterality Date  . No past surgeries    . Laparotomy N/A 12/13/2013    Procedure: EXPLORATORY LAPAROTOMY Repair ventral hernia, without mesh, partial omentectomy;  Surgeon: Evan Ober, MD;  Location: [facility] OR;  Service: General;  Laterality: N/A;   Family History  Problem Relation Age of Onset  . Diabetes Mother   . Hypertension Mother    Social History  Substance Use Topics  . Smoking status: Never Smoker   . Smokeless tobacco:  Never Used     Comment: never used tobacco  . Alcohol Use: No    Review of Systems  Constitutional: Negative for fever.  Respiratory: Negative for shortness of breath.   Cardiovascular: Negative for chest pain.  Skin: Positive for color change and wound.  All other systems reviewed and are negative.     Allergies  Review of patient's allergies indicates no known allergies.  Home Medications   Prior to Admission medications   Medication Sig Start Date End Date Taking? Authorizing Provider  aspirin EC 81 MG tablet Take 81 mg by mouth 2 (two) times daily.   Yes Historical Provider, MD  metFORMIN (GLUCOPHAGE) 500 MG tablet TAKE 1 TABLET (500 MG TOTAL) BY MOUTH 2 (TWO) TIMES DAILY WITH A MEAL. 04/10/15  Yes Doe-Hyun Evan Searles, DO  Multiple Vitamin (MULTIVITAMIN WITH MINERALS) TABS tablet Take 1 tablet by mouth daily.    Yes Historical Provider, MD   Triage vitals: BP 116/102 mmHg  Pulse 112  Temp(Src) 98.6 F (37 C) (Oral)  Resp 19  Wt 367 lb (166.47 kg)  SpO2 100% Physical Exam  Constitutional: He is oriented to person, place, and time. He appears well-developed and well-nourished.  obese  HENT:  Head: Normocephalic and atraumatic.  Cardiovascular: Regular rhythm.   No murmur heard. tachycardic  Pulmonary/Chest: Effort normal and breath sounds normal. No respiratory distress.  Abdominal: Soft. There is no  tenderness. There is no rebound and no guarding.  Musculoskeletal:  1+ pitting edema of LLE, 2-3+ pitting edema of RLE with marked erythema and large amount of serous exudate diffusely.  2+ DP pulses.  Erythema is from knee to ankle.    Neurological: He is alert and oriented to person, place, and time.  Skin: Skin is warm and dry.  Psychiatric: He has a normal mood and affect. His behavior is normal.  Nursing note and vitals reviewed.   ED Course  Procedures  DIAGNOSTIC STUDIES: Oxygen Saturation is 100% on RA, normal by my interpretation.  COORDINATION OF CARE:   11:48 PM Discussed treatment plan which includes IV antibiotics and IV fluids with pt at bedside and pt agreed to plan. Was discussed with pt that he will be admitted to the hospital and he agreed. Labs Review Labs Reviewed  COMPREHENSIVE METABOLIC PANEL - Abnormal; Notable for the following:    Glucose, Bld 146 (*)    Albumin 3.2 (*)    All other components within normal limits  CBC WITH DIFFERENTIAL/PLATELET - Abnormal; Notable for the following:    WBC 15.8 (*)    Neutro Abs 11.6 (*)    Monocytes Absolute 1.3 (*)    All other components within normal limits  URINALYSIS, ROUTINE W REFLEX MICROSCOPIC (NOT AT [facility]) - Abnormal; Notable for the following:    APPearance CLOUDY (*)    Hgb urine dipstick LARGE (*)    All other components within normal limits  URINE MICROSCOPIC-ADD ON - Abnormal; Notable for the following:    Squamous Epithelial / LPF 0-5 (*)    Bacteria, UA FEW (*)    All other components within normal limits  COMPREHENSIVE METABOLIC PANEL - Abnormal; Notable for the following:    Glucose, Bld 163 (*)    Calcium 8.6 (*)    Albumin 2.6 (*)    All other components within normal limits  CBC - Abnormal; Notable for the following:    Hemoglobin 12.5 (*)    HCT 38.6 (*)    All other components within normal limits  GLUCOSE, CAPILLARY - Abnormal; Notable for the following:    Glucose-Capillary 150 (*)    All other components within normal limits  I-STAT CG4 LACTIC ACID, ED - Abnormal; Notable for the following:    Lactic Acid, Venous 2.38 (*)    All other components within normal limits  I-STAT CG4 LACTIC ACID, ED - Abnormal; Notable for the following:    Lactic Acid, Venous 2.16 (*)    All other components within normal limits  CULTURE, BLOOD (ROUTINE X 2)  CULTURE, BLOOD (ROUTINE X 2)  URINE CULTURE  WOUND CULTURE  MAGNESIUM  PHOSPHORUS  LACTIC ACID, PLASMA  PROTIME-INR  TSH  HEMOGLOBIN A1C  LACTIC ACID, PLASMA  PROCALCITONIN  I-STAT CG4 LACTIC ACID, ED     Imaging Review Dg Chest 2 View  06/15/2015  CLINICAL DATA:  Acute onset of right leg swelling and erythema. Shortness of breath. Cough. Initial encounter. EXAM: CHEST  2 VIEW COMPARISON:  Chest radiograph from 05/30/2015 FINDINGS: The lungs are well-aerated. Mild left basilar atelectasis is noted. There is no evidence of pleural effusion or pneumothorax. The heart is normal in size; the mediastinal contour is within normal limits. No acute osseous abnormalities are seen. IMPRESSION: Mild left basilar atelectasis noted.  Lungs otherwise clear. Electronically Signed   By: Garald Balding M.D.   On: 06/15/2015 19:59   Dg Tibia/fibula Right  06/16/2015  CLINICAL DATA:  Subacute onset of erythema and swelling about the right lower leg. Initial encounter. EXAM: RIGHT TIBIA AND FIBULA - 2 VIEW COMPARISON:  Right knee radiographs performed 01/24/2014 FINDINGS: There is no evidence of fracture or dislocation. No osseous erosions are seen. The tibia and fibula appear intact. The knee joint is grossly unremarkable, aside from mild cortical irregularity along the articular surface of the patella. Plantar and posterior calcaneal spurs are seen. Diffuse soft tissue swelling is noted about the right lower leg and ankle. IMPRESSION: 1. No evidence of fracture or dislocation. No osseous erosions seen. 2. Diffuse soft tissue swelling about the right lower leg and ankle. Electronically Signed   By: Garald Balding M.D.   On: 06/16/2015 00:48   I have personally reviewed and evaluated these images and lab results as part of my medical decision-making.   EKG Interpretation   Date/Time:  Friday June 16 2015 00:22:10 EST Ventricular Rate:  102 PR Interval:  149 QRS Duration: 91 QT Interval:  339 QTC Calculation: 442 R Axis:   47 Text Interpretation:  Sinus tachycardia Abnormal R-wave progression, early  transition Confirmed by Hazle Coca [phone]) on 06/16/2015 12:51:25 AM      MDM   Final diagnoses:   Cellulitis of right lower extremity   Patient with history of diabetes here with significant erythema, swelling, tenderness in the right lower extremity. Examination is consistent with acute cellulitis. Providing IV antibiotics, fluids. Plan to admit to medicine for further treatment. Discussed with patient findings of studies and recommendation for admission and patient is in agreement with plan.  I personally performed the services described in this documentation, which was scribed in my presence. The recorded information has been reviewed and is accurate.   Quintella Reichert, MD 06/16/15 [phone]

## 2015-06-15 NOTE — ED Notes (Signed)
Patient presents with right leg swelling since yesterday.  Right lower leg red, swollen drainage noted

## 2015-06-15 NOTE — ED Provider Notes (Signed)
CSN: [id number]     Arrival date & time 06/15/15  1801 History   First MD Initiated Contact with Patient 06/15/15 1849     Chief Complaint  Patient presents with  . Leg Pain    right   (Consider location/radiation/quality/duration/timing/severity/associated sxs/prior Treatment) HPI Patient referred to urgent care by his primary care provider for swelling of his right lower extremity and possible cellulitis. He states that over the last 2 days his leg has swollen significantly pain score is 8 out of 10 right now. He has felt ill. Does have a history of cellulitis and in August 2016 had a DVT in the right leg. He has also had consultations with vascular surgery and has been advised that there is no surgical procedure date will help with the swelling of his leg. This time patient states that his leg has started weeping and he is usually placed in the hospital on IV antibiotics. He said his last episode of cellulitis was approximately one month ago. Discussion with his primary care provider today and was advised to come to urgent care for evaluation. Past Medical History  Diagnosis Date  . Obesity   . Pulmonary embolism ([place]) 05/04/2012    bilaterally  . Exertional dyspnea 05/04/2012    "isolated episode" (05/05/2012)  . Sleep apnea     "pretty sure; never been tested" (05/05/2012)  . History of chickenpox   . DVT (deep venous thrombosis) ([place])   . Diabetes mellitus, type II ([place]) 02/2013  . Varicose veins    Past Surgical History  Procedure Laterality Date  . No past surgeries    . Laparotomy N/A 12/13/2013    Procedure: EXPLORATORY LAPAROTOMY Repair ventral hernia, without mesh, partial omentectomy;  Surgeon: Gwenyth Ober, MD;  Location: [facility] OR;  Service: General;  Laterality: N/A;   Family History  Problem Relation Age of Onset  . Diabetes Mother   . Hypertension Mother    Social History  Substance Use Topics  . Smoking status: Never Smoker   . Smokeless tobacco: Never Used   Comment: never used tobacco  . Alcohol Use: No    Review of Systems ROS +'ve right leg swelling and infection.  Denies: HEADACHE, NAUSEA, ABDOMINAL PAIN, CHEST PAIN, CONGESTION, DYSURIA, SHORTNESS OF BREATH  Allergies  Review of patient's allergies indicates no known allergies.  Home Medications   Prior to Admission medications   Medication Sig Start Date End Date Taking? Authorizing Provider  aspirin EC 81 MG tablet Take 81 mg by mouth 2 (two) times daily.    Historical Provider, MD  cephALEXin (KEFLEX) 500 MG capsule Take 1 capsule (500 mg total) by mouth 3 (three) times daily. 05/18/15   Velvet Bathe, MD  doxycycline (VIBRAMYCIN) 100 MG capsule Take 1 capsule (100 mg total) by mouth 2 (two) times daily. 05/18/15   Velvet Bathe, MD  metFORMIN (GLUCOPHAGE) 500 MG tablet TAKE 1 TABLET (500 MG TOTAL) BY MOUTH 2 (TWO) TIMES DAILY WITH A MEAL. 04/10/15   Doe-Hyun Kyra Searles, DO  Multiple Vitamin (MULTIVITAMIN WITH MINERALS) TABS tablet Take 1 tablet by mouth daily.     Historical Provider, MD   Meds Ordered and Administered this Visit  Medications - No data to display  BP 144/92 mmHg  Pulse 120  Temp(Src) 98.3 F (36.8 C) (Oral)  Resp 20  SpO2 99% No data found.   Physical Exam  Constitutional: He appears well-developed and well-nourished. No distress.  HENT:  Head: Normocephalic and atraumatic.  Pulmonary/Chest: Effort normal.  Musculoskeletal: He exhibits edema and tenderness.       Right lower leg: He exhibits tenderness, swelling and edema.       Legs:   ED Course  Procedures (including critical care time)  Labs Review Labs Reviewed - No data to display  Imaging Review No results found.   Visual Acuity Review  Right Eye Distance:   Left Eye Distance:   Bilateral Distance:    Right Eye Near:   Left Eye Near:    Bilateral Near:         MDM   1. Cellulitis of right lower extremity    Patient is advised that it is tender in his best interest to be seen  in the emergency department for further stabilization and treatment and possible admission for parenteral antibiotics. Patient to be transported by shuttle.    Konrad Felix, [place] 06/15/15 1916

## 2015-06-16 ENCOUNTER — Emergency Department (HOSPITAL_COMMUNITY): Payer: PRIVATE HEALTH INSURANCE

## 2015-06-16 ENCOUNTER — Inpatient Hospital Stay (HOSPITAL_COMMUNITY): Payer: PRIVATE HEALTH INSURANCE

## 2015-06-16 DIAGNOSIS — I878 Other specified disorders of veins: Secondary | ICD-10-CM | POA: Diagnosis present

## 2015-06-16 DIAGNOSIS — Z6841 Body Mass Index (BMI) 40.0 and over, adult: Secondary | ICD-10-CM | POA: Diagnosis not present

## 2015-06-16 DIAGNOSIS — A419 Sepsis, unspecified organism: Secondary | ICD-10-CM | POA: Diagnosis present

## 2015-06-16 DIAGNOSIS — I10 Essential (primary) hypertension: Secondary | ICD-10-CM | POA: Diagnosis present

## 2015-06-16 DIAGNOSIS — E1165 Type 2 diabetes mellitus with hyperglycemia: Secondary | ICD-10-CM | POA: Diagnosis present

## 2015-06-16 DIAGNOSIS — Z833 Family history of diabetes mellitus: Secondary | ICD-10-CM | POA: Diagnosis not present

## 2015-06-16 DIAGNOSIS — L03115 Cellulitis of right lower limb: Secondary | ICD-10-CM | POA: Diagnosis present

## 2015-06-16 DIAGNOSIS — A4901 Methicillin susceptible Staphylococcus aureus infection, unspecified site: Secondary | ICD-10-CM | POA: Diagnosis present

## 2015-06-16 DIAGNOSIS — Z8249 Family history of ischemic heart disease and other diseases of the circulatory system: Secondary | ICD-10-CM | POA: Diagnosis not present

## 2015-06-16 DIAGNOSIS — L0390 Cellulitis, unspecified: Secondary | ICD-10-CM | POA: Diagnosis present

## 2015-06-16 DIAGNOSIS — I82402 Acute embolism and thrombosis of unspecified deep veins of left lower extremity: Secondary | ICD-10-CM | POA: Diagnosis present

## 2015-06-16 DIAGNOSIS — Z86711 Personal history of pulmonary embolism: Secondary | ICD-10-CM | POA: Diagnosis not present

## 2015-06-16 DIAGNOSIS — R600 Localized edema: Secondary | ICD-10-CM | POA: Diagnosis not present

## 2015-06-16 DIAGNOSIS — G4733 Obstructive sleep apnea (adult) (pediatric): Secondary | ICD-10-CM

## 2015-06-16 DIAGNOSIS — Z8619 Personal history of other infectious and parasitic diseases: Secondary | ICD-10-CM | POA: Diagnosis not present

## 2015-06-16 DIAGNOSIS — Z86718 Personal history of other venous thrombosis and embolism: Secondary | ICD-10-CM | POA: Diagnosis not present

## 2015-06-16 LAB — COMPREHENSIVE METABOLIC PANEL
ALT: 19 U/L (ref 17–63)
AST: 19 U/L (ref 15–41)
Albumin: 2.6 g/dL — ABNORMAL LOW (ref 3.5–5.0)
Alkaline Phosphatase: 73 U/L (ref 38–126)
Anion gap: 10 (ref 5–15)
BUN: 12 mg/dL (ref 6–20)
CO2: 24 mmol/L (ref 22–32)
Calcium: 8.6 mg/dL — ABNORMAL LOW (ref 8.9–10.3)
Chloride: 106 mmol/L (ref 101–111)
Creatinine, Ser: 1.03 mg/dL (ref 0.61–1.24)
GFR calc Af Amer: >60 mL/min (ref >60)
GFR calc non Af Amer: >60 mL/min (ref >60)
Glucose, Bld: 163 mg/dL — ABNORMAL HIGH (ref 65–99)
Potassium: 4 mmol/L (ref 3.5–5.1)
Sodium: 140 mmol/L (ref 135–145)
Total Bilirubin: 1.1 mg/dL (ref 0.3–1.2)
Total Protein: 6.6 g/dL (ref 6.5–8.1)

## 2015-06-16 LAB — GLUCOSE, CAPILLARY
Glucose-Capillary: 150 mg/dL — ABNORMAL HIGH (ref 65–99)
Glucose-Capillary: 130 mg/dL — ABNORMAL HIGH (ref 65–99)
Glucose-Capillary: 93 mg/dL (ref 65–99)
Glucose-Capillary: 144 mg/dL — ABNORMAL HIGH (ref 65–99)

## 2015-06-16 LAB — CBC
HCT: 38.6 % — ABNORMAL LOW (ref 39.0–52.0)
Hemoglobin: 12.5 g/dL — ABNORMAL LOW (ref 13.0–17.0)
MCH: 28.9 pg (ref 26.0–34.0)
MCHC: 32.4 g/dL (ref 30.0–36.0)
MCV: 89.4 fL (ref 78.0–100.0)
Platelets: 287 10*3/uL (ref 150–400)
RBC: 4.32 MIL/uL (ref 4.22–5.81)
RDW: 14.8 % (ref 11.5–15.5)
WBC: 9.9 10*3/uL (ref 4.0–10.5)

## 2015-06-16 LAB — PHOSPHORUS: Phosphorus: 2.9 mg/dL (ref 2.5–4.6)

## 2015-06-16 LAB — PROTIME-INR
INR: 1.15 (ref 0.00–1.49)
Prothrombin Time: 14.9 seconds (ref 11.6–15.2)

## 2015-06-16 LAB — MAGNESIUM: Magnesium: 1.7 mg/dL (ref 1.7–2.4)

## 2015-06-16 LAB — PROCALCITONIN: Procalcitonin: <0.1 ng/mL

## 2015-06-16 LAB — URINE CULTURE: Culture: NO GROWTH

## 2015-06-16 LAB — LACTIC ACID, PLASMA
Lactic Acid, Venous: 1.1 mmol/L (ref 0.5–2.0)
Lactic Acid, Venous: 1.1 mmol/L (ref 0.5–2.0)

## 2015-06-16 LAB — TSH: TSH: 1.246 u[IU]/mL (ref 0.350–4.500)

## 2015-06-16 MED ORDER — INSULIN ASPART 100 UNIT/ML ~~LOC~~ SOLN: 0.0000 [IU] | Freq: Every day | SUBCUTANEOUS | Status: DC

## 2015-06-16 MED ORDER — SODIUM CHLORIDE 0.9 % IJ SOLN
3.0000 mL | Freq: Two times a day (BID) | INTRAMUSCULAR | Status: DC
  Administered 2015-06-16 – 2015-06-18 (×5): 3 mL via INTRAVENOUS

## 2015-06-16 MED ORDER — INFLUENZA VAC SPLIT QUAD 0.5 ML IM SUSY
0.5000 mL | PREFILLED_SYRINGE | INTRAMUSCULAR | Status: AC
  Administered 2015-06-19: 0.5 mL via INTRAMUSCULAR
  Filled 2015-06-16: qty 0.5

## 2015-06-16 MED ORDER — OXYCODONE-ACETAMINOPHEN 5-325 MG PO TABS
1.0000 | ORAL_TABLET | Freq: Once | ORAL | Status: AC
  Administered 2015-06-16: 1 via ORAL
  Filled 2015-06-16: qty 1

## 2015-06-16 MED ORDER — ENOXAPARIN SODIUM 150 MG/ML ~~LOC~~ SOLN
1.0000 mg/kg | Freq: Two times a day (BID) | SUBCUTANEOUS | Status: DC
  Administered 2015-06-16: 160 mg via SUBCUTANEOUS
  Filled 2015-06-16 (×2): qty 1.08

## 2015-06-16 MED ORDER — VANCOMYCIN HCL 10 G IV SOLR
1500.0000 mg | Freq: Two times a day (BID) | INTRAVENOUS | Status: DC
  Administered 2015-06-16 – 2015-06-17 (×3): 1500 mg via INTRAVENOUS
  Filled 2015-06-16 (×6): qty 1500

## 2015-06-16 MED ORDER — ASPIRIN EC 81 MG PO TBEC
81.0000 mg | DELAYED_RELEASE_TABLET | Freq: Two times a day (BID) | ORAL | Status: DC
Start: 2015-06-16 — End: 2015-06-20
  Administered 2015-06-16 – 2015-06-20 (×9): 81 mg via ORAL
  Filled 2015-06-16 (×9): qty 1

## 2015-06-16 MED ORDER — INSULIN ASPART 100 UNIT/ML ~~LOC~~ SOLN
0.0000 [IU] | Freq: Three times a day (TID) | SUBCUTANEOUS | Status: DC
  Administered 2015-06-16: 3 [IU] via SUBCUTANEOUS
  Administered 2015-06-16 – 2015-06-20 (×6): 2 [IU] via SUBCUTANEOUS

## 2015-06-16 MED ORDER — PIPERACILLIN-TAZOBACTAM 3.375 G IVPB 30 MIN
3.3750 g | Freq: Three times a day (TID) | INTRAVENOUS | Status: DC
  Administered 2015-06-16 – 2015-06-18 (×9): 3.375 g via INTRAVENOUS
  Filled 2015-06-16 (×11): qty 50

## 2015-06-16 MED ORDER — ENOXAPARIN SODIUM 80 MG/0.8ML ~~LOC~~ SOLN
80.0000 mg | SUBCUTANEOUS | Status: DC
  Administered 2015-06-16: 80 mg via SUBCUTANEOUS
  Filled 2015-06-16: qty 0.8

## 2015-06-16 MED ORDER — PNEUMOCOCCAL VAC POLYVALENT 25 MCG/0.5ML IJ INJ
0.5000 mL | INJECTION | INTRAMUSCULAR | Status: AC
  Administered 2015-06-19: 0.5 mL via INTRAMUSCULAR
  Filled 2015-06-16: qty 0.5

## 2015-06-16 MED ORDER — ACETAMINOPHEN 325 MG PO TABS: 650.0000 mg | ORAL_TABLET | Freq: Four times a day (QID) | ORAL | Status: DC | PRN

## 2015-06-16 MED ORDER — ONDANSETRON HCL 4 MG PO TABS: 4.0000 mg | ORAL_TABLET | Freq: Four times a day (QID) | ORAL | Status: DC | PRN

## 2015-06-16 MED ORDER — VANCOMYCIN HCL 10 G IV SOLR
2500.0000 mg | Freq: Once | INTRAVENOUS | Status: AC
  Administered 2015-06-16: 2500 mg via INTRAVENOUS
  Filled 2015-06-16: qty 2500

## 2015-06-16 MED ORDER — ONDANSETRON HCL 4 MG/2ML IJ SOLN: 4.0000 mg | Freq: Four times a day (QID) | INTRAMUSCULAR | Status: DC | PRN

## 2015-06-16 MED ORDER — HYDROCODONE-ACETAMINOPHEN 5-325 MG PO TABS
1.0000 | ORAL_TABLET | ORAL | Status: DC | PRN
  Administered 2015-06-16 – 2015-06-19 (×9): 2 via ORAL
  Filled 2015-06-16 (×9): qty 2

## 2015-06-16 MED ORDER — SODIUM CHLORIDE 0.9 % IV SOLN
INTRAVENOUS | Status: DC
  Administered 2015-06-16: 06:00:00 via INTRAVENOUS

## 2015-06-16 MED ORDER — FUROSEMIDE 10 MG/ML IJ SOLN
40.0000 mg | Freq: Once | INTRAMUSCULAR | Status: AC
  Administered 2015-06-16: 40 mg via INTRAVENOUS
  Filled 2015-06-16: qty 4

## 2015-06-16 MED ORDER — MORPHINE SULFATE (PF) 2 MG/ML IV SOLN: 2.0000 mg | INTRAVENOUS | Status: DC | PRN

## 2015-06-16 MED ORDER — ACETAMINOPHEN 650 MG RE SUPP: 650.0000 mg | Freq: Four times a day (QID) | RECTAL | Status: DC | PRN

## 2015-06-16 NOTE — Progress Notes (Signed)
ANTIBIOTIC CONSULT NOTE - INITIAL  Pharmacy Consult for Vancomycin  Indication: Cellulitis  No Known Allergies  Patient Measurements: Weight: (!) 367 lb (166.47 kg)  Vital Signs: Temp: 98.6 F (37 C) (01/19 2218) Temp Source: Oral (01/19 2218) BP: 135/74 mmHg (01/20 0000) Pulse Rate: 103 (01/20 0000)  Labs:  Recent Labs  06/15/15 2015  WBC 15.8*  HGB 14.1  PLT 336  CREATININE 1.08   Estimated Creatinine Clearance: 126.7 mL/min (by C-G formula based on Cr of 1.08).    Medical History: Past Medical History  Diagnosis Date  . Obesity   . Pulmonary embolism ([place]) 05/04/2012    bilaterally  . Exertional dyspnea 05/04/2012    "isolated episode" (05/05/2012)  . Sleep apnea     "pretty sure; never been tested" (05/05/2012)  . History of chickenpox   . DVT (deep venous thrombosis) ([place])   . Diabetes mellitus, type II ([place]) 02/2013  . Varicose veins     Assessment: 55 y/o M with right lower extremity cellulitis, pt has history of cellulitis, WBC is elevated, renal function appears normal, other labs reviewed.   Goal of Therapy:  Vancomycin trough level 10-15 mcg/ml  Plan:  -Vancomycin 2500 mg IV load, then 1500 mg IV q12h (based on previous dosing/trough levels) -Trend WBC, temp, renal function  -Vancomycin trough at steady state  Narda Bonds 06/16/2015,12:18 AM

## 2015-06-16 NOTE — ED Notes (Signed)
IV at bedside attempting second IV

## 2015-06-16 NOTE — ED Notes (Signed)
Admitting at bedside 

## 2015-06-16 NOTE — Progress Notes (Signed)
ANTICOAGULATION CONSULT NOTE - Initial Consult  Pharmacy Consult for Lovenox Indication: DVT  No Known Allergies  Patient Measurements: Height: 6\' 2"  (188 cm) Weight: (!) 358 lb 11.2 oz (162.705 kg) (Scale B) IBW/kg (Calculated) : 82.2 Heparin Dosing Weight:   Vital Signs: Temp: 98.4 F (36.9 C) (01/20 0941) Temp Source: Oral (01/20 0941) BP: 120/70 mmHg (01/20 0941) Pulse Rate: 91 (01/20 0941)  Labs:  Recent Labs  06/15/15 2015 06/16/15 0713  HGB 14.1 12.5*  HCT 43.6 38.6*  PLT 336 287  LABPROT  --  14.9  INR  --  1.15  CREATININE 1.08 1.03    Estimated Creatinine Clearance: 129.6 mL/min (by C-G formula based on Cr of 1.03).   Medical History: Past Medical History  Diagnosis Date  . Obesity   . Pulmonary embolism ([place]) 05/04/2012    bilaterally  . Exertional dyspnea 05/04/2012    "isolated episode" (05/05/2012)  . Sleep apnea     "pretty sure; never been tested" (05/05/2012)  . History of chickenpox   . DVT (deep venous thrombosis) ([place])   . Diabetes mellitus, type II ([place]) 02/2013  . Varicose veins     Medications:  Prescriptions prior to admission  Medication Sig Dispense Refill Last Dose  . aspirin EC 81 MG tablet Take 81 mg by mouth 2 (two) times daily.   06/15/2015 at am  . metFORMIN (GLUCOPHAGE) 500 MG tablet TAKE 1 TABLET (500 MG TOTAL) BY MOUTH 2 (TWO) TIMES DAILY WITH A MEAL. 60 tablet 5 06/15/2015 at am  . Multiple Vitamin (MULTIVITAMIN WITH MINERALS) TABS tablet Take 1 tablet by mouth daily.    06/15/2015 at Unknown time   Scheduled:  . aspirin EC  81 mg Oral BID  . enoxaparin (LOVENOX) injection  80 mg Subcutaneous Q24H  . [START ON 06/17/2015] Influenza vac split quadrivalent PF  0.5 mL Intramuscular Tomorrow-1000  . insulin aspart  0-15 Units Subcutaneous TID WC  . insulin aspart  0-5 Units Subcutaneous QHS  . piperacillin-tazobactam  3.375 g Intravenous 3 times per day  . [START ON 06/17/2015] pneumococcal 23 valent vaccine  0.5 mL  Intramuscular Tomorrow-1000  . sodium chloride  3 mL Intravenous Q12H  . vancomycin  1,500 mg Intravenous Q12H   Infusions:    Assessment: 55yo male presents with pain in RLE. Pharmacy is consulted to dose lovenox for DVT in L gastrovein-left prox calf area.   Goal of Therapy:  Monitor platelets by anticoagulation protocol: Yes   Plan:  Lovenox 1mg /kg q12h Monitor s/sx of bleeding F/u oral anticoag  Andrey Cota. Diona Foley, PharmD, [place] Clinical Pharmacist Pager [phone] 06/16/2015,6:58 PM

## 2015-06-16 NOTE — Progress Notes (Signed)
VASCULAR LAB PRELIMINARY  PRELIMINARY  PRELIMINARY  PRELIMINARY   Bilateral lower extremity venous Doppler has been completed.     Left: Acute thrombus DVT noted in Left gastro.vein-left prox calf area.  No evidence of superficial thrombosis.  No Baker's cyst bilaterally.  Exam difficult due to Edema bilaterally. Called result to patient RN-Landra.   Johnpaul Gillentine, RVT, RDMS 06/16/2015, 3:07 PM

## 2015-06-16 NOTE — H&P (Signed)
PCP: Drema Pry, DO    Referring provider Ayesha Rumpf   Chief Complaint:  Right leg sweling  HPI: Evan Leonard is a 56 y.o. male   has a past medical history of Obesity; Pulmonary embolism ([place]) (05/04/2012); Exertional dyspnea (05/04/2012); Sleep apnea; History of chickenpox; DVT (deep venous thrombosis) ([place]); Diabetes mellitus, type II ([place]) (02/2013); and Varicose veins.   Presented with 2 day of right leg swelling weeping, erythema and pain. 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 . Has hx of DM 2 well controlled. Reports some fever and chills, no vomiting  some nausea.  Has hx of morbid obesity and OSA  On CPAP Hospitalist was called for admission for left leg cellulitis  Review of Systems:    Pertinent positives include: Fevers, chills, Bilateral lower extremity swelling nausea,   Constitutional:  No weight loss, night sweats, fatigue, weight loss  HEENT:  No headaches, Difficulty swallowing,Tooth/dental problems,Sore throat,  No sneezing, itching, ear ache, nasal congestion, post nasal drip,  Cardio-vascular:  No chest pain, Orthopnea, PND, anasarca, dizziness, palpitations.no  GI:  No heartburn, indigestion, abdominal pain, vomiting, diarrhea, change in bowel habits, loss of appetite, melena, blood in stool, hematemesis Resp:  no shortness of breath at rest. No dyspnea on exertion, No excess mucus, no productive cough, No non-productive cough, No coughing up of blood.No change in color of mucus.No wheezing. Skin:  no rash or lesions. No jaundice GU:  no dysuria, change in color of urine, no urgency or frequency. No straining to urinate.  No flank pain.  Musculoskeletal:  No joint pain or no joint swelling. No decreased range of motion. No back pain.  Psych:  No change in mood or affect. No depression or anxiety. No memory loss.  Neuro: no localizing neurological complaints, no tingling, no weakness, no  double vision, no gait abnormality, no slurred speech, no confusion  Otherwise ROS are negative except for above, 10 systems were reviewed  Past Medical History: Past Medical History  Diagnosis Date  . Obesity   . Pulmonary embolism ([place]) 05/04/2012    bilaterally  . Exertional dyspnea 05/04/2012    "isolated episode" (05/05/2012)  . Sleep apnea     "pretty sure; never been tested" (05/05/2012)  . History of chickenpox   . DVT (deep venous thrombosis) ([place])   . Diabetes mellitus, type II ([place]) 02/2013  . Varicose veins    Past Surgical History  Procedure Laterality Date  . No past surgeries    . Laparotomy N/A 12/13/2013    Procedure: EXPLORATORY LAPAROTOMY Repair ventral hernia, without mesh, partial omentectomy;  Surgeon: Gwenyth Ober, MD;  Location: [place];  Service: General;  Laterality: N/A;     Medications: Prior to Admission medications   Medication Sig Start Date End Date Taking? Authorizing Provider  aspirin EC 81 MG tablet Take 81 mg by mouth 2 (two) times daily.   Yes Historical Provider, MD  metFORMIN (GLUCOPHAGE) 500 MG tablet TAKE 1 TABLET (500 MG TOTAL) BY MOUTH 2 (TWO) TIMES DAILY WITH A MEAL. 04/10/15  Yes Doe-Hyun Kyra Searles, DO  Multiple Vitamin (MULTIVITAMIN WITH MINERALS) TABS tablet Take 1 tablet by mouth daily.    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 He  has never used smokeless tobacco. He reports that he does not drink alcohol or use illicit drugs.     Family History: family history includes Diabetes in his mother; Hypertension in his mother.    Physical Exam: Patient Vitals for the past 24 hrs:  BP Temp Temp src Pulse Resp SpO2 Weight  06/16/15 0115 133/67 mmHg - - 103 22 98 % -  06/16/15 0109 112/74 mmHg - - 103 22 99 % -  06/16/15 0000 135/74 mmHg - - 103 20 99 % -  06/15/15 2345 122/78  mmHg - - 105 12 97 % -  06/15/15 2330 (!) 116/102 mmHg - - 112 19 100 % -  06/15/15 2218 138/94 mmHg 98.6 F (37 C) Oral (!) 122 22 98 % -  06/15/15 1929 - - - - - - (!) 166.47 kg (367 lb)  06/15/15 1926 136/91 mmHg 99.9 F (37.7 C) - (!) 121 24 98 % -    1. General:  in No Acute distress 2. Psychological: Alert and  Oriented 3. Head/ENT:   Moist   Mucous Membranes                          Head Non traumatic, neck supple                          Normal   Dentition 4. SKIN: normal   Skin turgor,  Skin clean Dry erythema and broken skin    5. Heart: Regular rate and rhythm no Murmur, Rub or gallop 6. Lungs: distant, no wheezes or crackles   7. Abdomen: Soft, non-tender, Non distended 8. Lower extremities: no clubbing, cyanosis,   Edema redness and erythema 9. Neurologically Grossly intact, moving all 4 extremities equally 10. MSK: Normal range of motion  body mass index is 45.87 kg/(m^2).   Labs on Admission:   Results for orders placed or performed during the hospital encounter of 06/15/15 (from the past 24 hour(s))  Urinalysis, Routine w reflex microscopic (not at [facility])     Status: Abnormal   Collection Time: 06/15/15  7:48 PM  Result Value Ref Range   Color, Urine YELLOW YELLOW   APPearance CLOUDY (A) CLEAR   Specific Gravity, Urine 1.019 1.005 - 1.030   pH 5.0 5.0 - 8.0   Glucose, UA NEGATIVE NEGATIVE mg/dL   Hgb urine dipstick LARGE (A) NEGATIVE   Bilirubin Urine NEGATIVE NEGATIVE   Ketones, ur NEGATIVE NEGATIVE mg/dL   Protein, ur NEGATIVE NEGATIVE mg/dL   Nitrite NEGATIVE NEGATIVE   Leukocytes, UA NEGATIVE NEGATIVE  Urine microscopic-add on     Status: Abnormal   Collection Time: 06/15/15  7:48 PM  Result Value Ref Range   Squamous Epithelial / LPF 0-5 (A) NONE SEEN   WBC, UA 0-5 0 - 5 WBC/hpf   RBC / HPF TOO NUMEROUS TO COUNT 0 - 5 RBC/hpf   Bacteria, UA FEW (A) NONE SEEN   Urine-Other MUCOUS PRESENT   Comprehensive metabolic panel     Status: Abnormal    Collection Time: 06/15/15  8:15 PM  Result Value Ref Range   Sodium 140 135 - 145 mmol/L   Potassium 4.5 3.5 - 5.1 mmol/L   Chloride 107 101 - 111 mmol/L   CO2 23 22 - 32 mmol/L   Glucose, Bld 146 (H) 65 - 99 mg/dL   BUN 11 6 - 20 mg/dL   Creatinine, Ser 1.08 0.61 -  1.24 mg/dL   Calcium 9.4 8.9 - 10.3 mg/dL   Total Protein 8.0 6.5 - 8.1 g/dL   Albumin 3.2 (L) 3.5 - 5.0 g/dL   AST 26 15 - 41 U/L   ALT 24 17 - 63 U/L   Alkaline Phosphatase 86 38 - 126 U/L   Total Bilirubin 0.9 0.3 - 1.2 mg/dL   GFR calc non Af Amer >60 >60 mL/min   GFR calc Af Amer >60 >60 mL/min   Anion gap 10 5 - 15  CBC with Differential     Status: Abnormal   Collection Time: 06/15/15  8:15 PM  Result Value Ref Range   WBC 15.8 (H) 4.0 - 10.5 K/uL   RBC 4.89 4.22 - 5.81 MIL/uL   Hemoglobin 14.1 13.0 - 17.0 g/dL   HCT 43.6 39.0 - 52.0 %   MCV 89.2 78.0 - 100.0 fL   MCH 28.8 26.0 - 34.0 pg   MCHC 32.3 30.0 - 36.0 g/dL   RDW 14.7 11.5 - 15.5 %   Platelets 336 150 - 400 K/uL   Neutrophils Relative % 73 %   Neutro Abs 11.6 (H) 1.7 - 7.7 K/uL   Lymphocytes Relative 16 %   Lymphs Abs 2.5 0.7 - 4.0 K/uL   Monocytes Relative 8 %   Monocytes Absolute 1.3 (H) 0.1 - 1.0 K/uL   Eosinophils Relative 3 %   Eosinophils Absolute 0.5 0.0 - 0.7 K/uL   Basophils Relative 0 %   Basophils Absolute 0.1 0.0 - 0.1 K/uL  I-Stat CG4 Lactic Acid, ED (Not at [facility])     Status: Abnormal   Collection Time: 06/15/15  8:27 PM  Result Value Ref Range   Lactic Acid, Venous 2.38 (HH) 0.5 - 2.0 mmol/L   Comment NOTIFIED PHYSICIAN   I-Stat CG4 Lactic Acid, ED (Not at [facility])     Status: None   Collection Time: 06/15/15 11:45 PM  Result Value Ref Range   Lactic Acid, Venous 1.86 0.5 - 2.0 mmol/L  I-Stat CG4 Lactic Acid, ED     Status: Abnormal   Collection Time: 06/15/15 11:58 PM  Result Value Ref Range   Lactic Acid, Venous 2.16 (HH) 0.5 - 2.0 mmol/L   Comment NOTIFIED PHYSICIAN     UA  Not obtained  Lab Results  Component Value  Date   HGBA1C 6.8* 12/13/2013    Estimated Creatinine Clearance: 126.7 mL/min (by C-G formula based on Cr of 1.08).  BNP (last 3 results) No results for input(s): PROBNP in the last 8760 hours.  Other results:  I have pearsonaly reviewed this: ECG REPORT  Rate: 102  Rhythm: NSR ST&T Change: no ischemic changes Normal QTC  Filed Weights   06/15/15 1929  Weight: 166.47 kg (367 lb)     Cultures:    Component Value Date/Time   SDES BLOOD RIGHT ARM 05/17/2015 1739   SPECREQUEST BOTTLES DRAWN AEROBIC AND ANAEROBIC 10CC 05/17/2015 1739   CULT  05/17/2015 1739    NO GROWTH 5 DAYS Performed at [facility] 05/22/2015 FINAL 05/17/2015 1739     Radiological Exams on Admission: Dg Chest 2 View  06/15/2015  CLINICAL DATA:  Acute onset of right leg swelling and erythema. Shortness of breath. Cough. Initial encounter. EXAM: CHEST  2 VIEW COMPARISON:  Chest radiograph from 05/30/2015 FINDINGS: The lungs are well-aerated. Mild left basilar atelectasis is noted. There is no evidence of pleural effusion or pneumothorax. The heart is normal in size; the  mediastinal contour is within normal limits. No acute osseous abnormalities are seen. IMPRESSION: Mild left basilar atelectasis noted.  Lungs otherwise clear. Electronically Signed   By: Garald Balding M.D.   On: 06/15/2015 19:59   Dg Tibia/fibula Right  06/16/2015  CLINICAL DATA:  Subacute onset of erythema and swelling about the right lower leg. Initial encounter. EXAM: RIGHT TIBIA AND FIBULA - 2 VIEW COMPARISON:  Right knee radiographs performed 01/24/2014 FINDINGS: There is no evidence of fracture or dislocation. No osseous erosions are seen. The tibia and fibula appear intact. The knee joint is grossly unremarkable, aside from mild cortical irregularity along the articular surface of the patella. Plantar and posterior calcaneal spurs are seen. Diffuse soft tissue swelling is noted about the right lower leg and ankle.  IMPRESSION: 1. No evidence of fracture or dislocation. No osseous erosions seen. 2. Diffuse soft tissue swelling about the right lower leg and ankle. Electronically Signed   By: Garald Balding M.D.   On: 06/16/2015 00:48    Chart has been reviewed  Family not at  Bedside    Assessment/Plan  55 yo m with history of recurrent cellulitis in the setting of morbid obesity in diabetes here with right lower extremity cellulitis and sepsis  Present on Admission:  . Cellulitis of right lower extremity with evidence of sepsis we'll admit for broad-spectrum antibiotic coverage given persistent tachycardia monitor on telemetry. Cover with vancomycin and Zosyn for now  . Diabetes mellitus with hyperglycemia ([place]) . Obstructive sleep apnea CPAp . 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 Due to Morbid obesity   . Sepsis ([place]) admit to telemetry broad-spectrum antibiotics serial lactic acid and procalcitonin   Prophylaxis:  Lovenox   CODE STATUS:  FULL CODE  as per patient   Disposition:  To home once workup is complete and patient is stable  Other plan as per orders.  I have spent a total of 55 min on this admission  Jacyln Carmer 06/16/2015, 2:03 AM    [facility]  Pager [phone]   after 2 AM please page floor coverage PA If 7AM-7PM, please contact the day team taking care of the patient  [email or web address]  Password [email or web address]

## 2015-06-16 NOTE — Progress Notes (Signed)
Utilization review completed. Ilithyia Titzer, RN, BSN. 

## 2015-06-16 NOTE — ED Notes (Signed)
Pt to xray

## 2015-06-16 NOTE — ED Notes (Signed)
Pt provided with [place] [employer] baggie and beverage

## 2015-06-16 NOTE — Progress Notes (Signed)
Patient refusing CPAP for tonight. Stated he might try it tomorrow night. RT made pt aware that if he changed his mind to call.

## 2015-06-16 NOTE — Progress Notes (Signed)
[facility] PROGRESS NOTE   Evan Leonard [id number] DOB: Feb 21, 1961 DOA: 06/15/2015 PCP: Drema Pry, DO  HPI/Subjective: Feels okay, has pain in his right lower extremity  Assessment/Plan: Active Problems:   Pulmonary embolism, hx of   Morbid obesity with body mass index of 40.0-44.9 in adult [facility])   Obstructive sleep apnea   Diabetes mellitus with hyperglycemia (H[facility])   Cellulitis of right lower extremity   Cellulitis   Sepsis ([place])   This is a no charge notes, patient seen earlier today by my colleague Dr. D Patient seen and examined, database reviewed. Admitted for right lower extremity cellulitis, recurrent. Has early sepsis with WBCs 15.8, heart rate is 122 and lactic acid of 2.38. Treated with IV antibiotics.  Code Status: Full Code Family Communication: Plan discussed with the patient. Disposition Plan: Remains inpatient Diet: Diet Carb Modified Fluid consistency:: Thin; Room service appropriate?: Yes  Consultants:  None  Procedures:  None  Antibiotics:  Zosyn and vanc   Objective: Filed Vitals:   06/16/15 0623 06/16/15 0941  BP: 128/79 120/70  Pulse: 94 91  Temp: 97.8 F (36.6 C) 98.4 F (36.9 C)  Resp: 18 16    Intake/Output Summary (Last 24 hours) at 06/16/15 1138 Last data filed at 06/16/15 1017  Gross per 24 hour  Intake 1324.17 ml  Output    400 ml  Net 924.17 ml   Filed Weights   06/15/15 1929 06/16/15 0411  Weight: 166.47 kg (367 lb) 162.705 kg (358 lb 11.2 oz)    Exam: General: Alert and awake, oriented x3, not in any acute distress. HEENT: anicteric sclera, pupils reactive to light and accommodation, EOMI CVS: S1-S2 clear, no murmur rubs or gallops Chest: clear to auscultation bilaterally, no wheezing, rales or rhonchi Abdomen: soft nontender, nondistended, normal bowel sounds, no organomegaly Extremities: no cyanosis, clubbing or edema noted bilaterally Neuro: Cranial nerves II-XII intact, no focal neurological  deficits  Data Reviewed: Basic Metabolic Panel:  Recent Labs Lab 06/15/15 2015 06/16/15 0713  NA 140 140  K 4.5 4.0  CL 107 106  CO2 23 24  GLUCOSE 146* 163*  BUN 11 12  CREATININE 1.08 1.03  CALCIUM 9.4 8.6*  MG  --  1.7  PHOS  --  2.9   Liver Function Tests:  Recent Labs Lab 06/15/15 2015 06/16/15 0713  AST 26 19  ALT 24 19  ALKPHOS 86 73  BILITOT 0.9 1.1  PROT 8.0 6.6  ALBUMIN 3.2* 2.6*   No results for input(s): LIPASE, AMYLASE in the last 168 hours. No results for input(s): AMMONIA in the last 168 hours. CBC:  Recent Labs Lab 06/15/15 2015 06/16/15 0713  WBC 15.8* 9.9  NEUTROABS 11.6*  --   HGB 14.1 12.5*  HCT 43.6 38.6*  MCV 89.2 89.4  PLT 336 287   Cardiac Enzymes: No results for input(s): CKTOTAL, CKMB, CKMBINDEX, TROPONINI in the last 168 hours. BNP (last 3 results) No results for input(s): BNP in the last 8760 hours.  ProBNP (last 3 results) No results for input(s): PROBNP in the last 8760 hours.  CBG:  Recent Labs Lab 06/16/15 0625  GLUCAP 150*    Micro Recent Results (from the past 240 hour(s))  Urine culture     Status: None (Preliminary result)   Collection Time: 06/15/15  7:48 PM  Result Value Ref Range Status   Specimen Description URINE, CLEAN CATCH  Final   Special Requests NONE  Final   Culture NO GROWTH < 24 HOURS  Final   Report Status PENDING  Incomplete  Wound culture     Status: None (Preliminary result)   Collection Time: 06/16/15 12:02 AM  Result Value Ref Range Status   Specimen Description LEG  Final   Special Requests EXUDATE LOWER LEG  Final   Gram Stain   Final    RARE WBC PRESENT, PREDOMINANTLY PMN RARE SQUAMOUS EPITHELIAL CELLS PRESENT MODERATE GRAM POSITIVE COCCI IN CLUSTERS Performed at [facility]    Culture PENDING  Incomplete   Report Status PENDING  Incomplete     Studies: Dg Chest 2 View  06/15/2015  CLINICAL DATA:  Acute onset of right leg swelling and erythema. Shortness of  breath. Cough. Initial encounter. EXAM: CHEST  2 VIEW COMPARISON:  Chest radiograph from 05/30/2015 FINDINGS: The lungs are well-aerated. Mild left basilar atelectasis is noted. There is no evidence of pleural effusion or pneumothorax. The heart is normal in size; the mediastinal contour is within normal limits. No acute osseous abnormalities are seen. IMPRESSION: Mild left basilar atelectasis noted.  Lungs otherwise clear. Electronically Signed   By: Garald Balding M.D.   On: 06/15/2015 19:59   Dg Tibia/fibula Right  06/16/2015  CLINICAL DATA:  Subacute onset of erythema and swelling about the right lower leg. Initial encounter. EXAM: RIGHT TIBIA AND FIBULA - 2 VIEW COMPARISON:  Right knee radiographs performed 01/24/2014 FINDINGS: There is no evidence of fracture or dislocation. No osseous erosions are seen. The tibia and fibula appear intact. The knee joint is grossly unremarkable, aside from mild cortical irregularity along the articular surface of the patella. Plantar and posterior calcaneal spurs are seen. Diffuse soft tissue swelling is noted about the right lower leg and ankle. IMPRESSION: 1. No evidence of fracture or dislocation. No osseous erosions seen. 2. Diffuse soft tissue swelling about the right lower leg and ankle. Electronically Signed   By: Garald Balding M.D.   On: 06/16/2015 00:48    Scheduled Meds: . aspirin EC  81 mg Oral BID  . enoxaparin (LOVENOX) injection  80 mg Subcutaneous Q24H  . [START ON 06/17/2015] Influenza vac split quadrivalent PF  0.5 mL Intramuscular Tomorrow-1000  . insulin aspart  0-15 Units Subcutaneous TID WC  . insulin aspart  0-5 Units Subcutaneous QHS  . piperacillin-tazobactam  3.375 g Intravenous 3 times per day  . [START ON 06/17/2015] pneumococcal 23 valent vaccine  0.5 mL Intramuscular Tomorrow-1000  . sodium chloride  3 mL Intravenous Q12H  . vancomycin  1,500 mg Intravenous Q12H   Continuous Infusions: . sodium chloride 125 mL/hr at 06/16/15 0534         Time spent: 35 minutes    [facility]  [facility] Pager [phone] If 7PM-7AM, please contact night-coverage at [email or web address], password [facility] 06/16/2015, 11:38 AM  LOS: 0 days

## 2015-06-17 DIAGNOSIS — Z6841 Body Mass Index (BMI) 40.0 and over, adult: Secondary | ICD-10-CM

## 2015-06-17 LAB — BASIC METABOLIC PANEL
Anion gap: 11 (ref 5–15)
BUN: 11 mg/dL (ref 6–20)
CO2: 24 mmol/L (ref 22–32)
Calcium: 8.6 mg/dL — ABNORMAL LOW (ref 8.9–10.3)
Chloride: 108 mmol/L (ref 101–111)
Creatinine, Ser: 1.08 mg/dL (ref 0.61–1.24)
GFR calc Af Amer: >60 mL/min (ref >60)
GFR calc non Af Amer: >60 mL/min (ref >60)
Glucose, Bld: 121 mg/dL — ABNORMAL HIGH (ref 65–99)
Potassium: 5 mmol/L (ref 3.5–5.1)
Sodium: 143 mmol/L (ref 135–145)

## 2015-06-17 LAB — CBC
HCT: 38.9 % — ABNORMAL LOW (ref 39.0–52.0)
Hemoglobin: 12.9 g/dL — ABNORMAL LOW (ref 13.0–17.0)
MCH: 29.1 pg (ref 26.0–34.0)
MCHC: 33.2 g/dL (ref 30.0–36.0)
MCV: 87.8 fL (ref 78.0–100.0)
Platelets: 246 10*3/uL (ref 150–400)
RBC: 4.43 MIL/uL (ref 4.22–5.81)
RDW: 15 % (ref 11.5–15.5)
WBC: 8.8 10*3/uL (ref 4.0–10.5)

## 2015-06-17 LAB — GLUCOSE, CAPILLARY
Glucose-Capillary: 123 mg/dL — ABNORMAL HIGH (ref 65–99)
Glucose-Capillary: 120 mg/dL — ABNORMAL HIGH (ref 65–99)
Glucose-Capillary: 107 mg/dL — ABNORMAL HIGH (ref 65–99)
Glucose-Capillary: 106 mg/dL — ABNORMAL HIGH (ref 65–99)

## 2015-06-17 LAB — HEMOGLOBIN A1C
Hgb A1c MFr Bld: 7.5 % — ABNORMAL HIGH (ref 4.8–5.6)
Mean Plasma Glucose: 169 mg/dL

## 2015-06-17 MED ORDER — APIXABAN 5 MG PO TABS
10.0000 mg | ORAL_TABLET | Freq: Two times a day (BID) | ORAL | Status: DC
  Administered 2015-06-17 – 2015-06-20 (×7): 10 mg via ORAL
  Filled 2015-06-17 (×7): qty 2

## 2015-06-17 MED ORDER — SILVER SULFADIAZINE 1 % EX CREA
TOPICAL_CREAM | Freq: Two times a day (BID) | CUTANEOUS | Status: DC
  Administered 2015-06-17 – 2015-06-18 (×4): via TOPICAL
  Administered 2015-06-19 (×2): 1 via TOPICAL
  Administered 2015-06-20: 10:00:00 via TOPICAL
  Filled 2015-06-17: qty 85

## 2015-06-17 MED ORDER — FUROSEMIDE 10 MG/ML IJ SOLN
40.0000 mg | Freq: Once | INTRAMUSCULAR | Status: AC
  Administered 2015-06-17: 40 mg via INTRAVENOUS
  Filled 2015-06-17: qty 4

## 2015-06-17 MED ORDER — APIXABAN 5 MG PO TABS: 5.0000 mg | ORAL_TABLET | Freq: Two times a day (BID) | ORAL | Status: DC

## 2015-06-17 NOTE — Progress Notes (Signed)
ANTICOAGULATION CONSULT NOTE - Initial Consult  Pharmacy Consult for Apixaban Indication: DVT  No Known Allergies  Patient Measurements: Height: 6\' 2"  (188 cm) Weight: (!) 358 lb (162.388 kg) (scale b) IBW/kg (Calculated) : 82.2  Vital Signs: Temp: 97.9 F (36.6 C) (01/21 0352) Temp Source: Oral (01/21 0352) BP: 131/74 mmHg (01/21 0352) Pulse Rate: 88 (01/21 0352)  Labs:  Recent Labs  06/15/15 2015 06/16/15 0713 06/17/15 0300  HGB 14.1 12.5* 12.9*  HCT 43.6 38.6* 38.9*  PLT 336 287 246  LABPROT  --  14.9  --   INR  --  1.15  --   CREATININE 1.08 1.03 1.08    Estimated Creatinine Clearance: 123.5 mL/min (by C-G formula based on Cr of 1.08).   Medical History: Past Medical History  Diagnosis Date  . Obesity   . Pulmonary embolism ([place]) 05/04/2012    bilaterally  . Exertional dyspnea 05/04/2012    "isolated episode" (05/05/2012)  . Sleep apnea     "pretty sure; never been tested" (05/05/2012)  . History of chickenpox   . DVT (deep venous thrombosis) ([place])   . Diabetes mellitus, type II ([place]) 02/2013  . Varicose veins      Assessment: 55 yo f presenting to the ED with cellulitis.  Found with DVT in left leg.  Pt has received one dose of Lovenox last night.  Pharmacy is consulted to dose apixaban now. Hgb 12.9, plts 246 - stable. No issues noted.  Goal of Therapy:  Monitor platelets by anticoagulation protocol: Yes   Plan:  Discontinue Lovenox Apixaban 10 mg PO BID x 7 days followed by 5 mg PO BID Monitor CBC  Juelz Claar L. Nicole Kindred, PharmD PGY2 Infectious Diseases Pharmacy Resident Pager: [phone] 06/17/2015 8:28 AM

## 2015-06-17 NOTE — Progress Notes (Signed)
[facility] PROGRESS NOTE   Evan Leonard [id number] DOB: 1960/10/14 DOA: 06/15/2015 PCP: Drema Pry, DO  HPI/Subjective: Still has redness, pain and swelling in his right lower extremity. Surprisingly the Doppler showed left lower extremity DVT.  Assessment/Plan: Active Problems:   Pulmonary embolism, hx of   Morbid obesity with body mass index of 40.0-44.9 in adult [facility])   Obstructive sleep apnea   Diabetes mellitus with hyperglycemia (HCC)   Cellulitis of right lower extremity   Cellulitis   Sepsis (HCC)    Sepsis syndrome Presented with heart rate of 122, WBC of 15.8 and lactic acid of 2.38 and presence of RLE cellulitis. Patient treated with broad-spectrum antibiotics for the cellulitis. No IV fluids given, patient has bilateral lower extremity swelling. Sepsis physiology years old.  Right lower extremity cellulitis Recurrent right lower x-rays cellulitis likely secondary to venous stasis. Started on vancomycin and Zosyn, still very red and painful/tender Continue current antibiotics, weeping fluids, has bilateral lower extremity edema, however care of Lasix.  Acute left lower extremity DVT Patient has had previous DVT in the left lower extremity, treated with several toe for about 9 months. This is almost incidental finding, Doppler was done to check right lower extremity for DVT. It showed acute thrombus in the LLE. Initially started on Lovenox, switched to Eliquis on 06/17/15.  OSA Continue CPAP.  Diabetes mellitus with hyperglycemia Uncontrolled diabetes mellitus type 2 with hemoglobin A1c of 7.5. Carbohydrate modified diet and SSI.  Code Status: Full Code Family Communication: Plan discussed with the patient. Disposition Plan: Remains inpatient Diet: Diet Carb Modified Fluid consistency:: Thin; Room service appropriate?: Yes  Consultants:  None  Procedures:  None  Antibiotics:  Zosyn and vanc   Objective: Filed Vitals:   06/16/15  1958 06/17/15 0352  BP: 104/76 131/74  Pulse: 86 88  Temp: 98.2 F (36.8 C) 97.9 F (36.6 C)  Resp: 18 18    Intake/Output Summary (Last 24 hours) at 06/17/15 1225 Last data filed at 06/17/15 0800  Gross per 24 hour  Intake 2583.75 ml  Output   2950 ml  Net -366.25 ml   Filed Weights   06/15/15 1929 06/16/15 0411 06/17/15 0352  Weight: 166.47 kg (367 lb) 162.705 kg (358 lb 11.2 oz) 162.388 kg (358 lb)    Exam: General: Alert and awake, oriented x3, not in any acute distress. HEENT: anicteric sclera, pupils reactive to light and accommodation, EOMI CVS: S1-S2 clear, no murmur rubs or gallops Chest: clear to auscultation bilaterally, no wheezing, rales or rhonchi Abdomen: soft nontender, nondistended, normal bowel sounds, no organomegaly Extremities: no cyanosis, clubbing or edema noted bilaterally Neuro: Cranial nerves II-XII intact, no focal neurological deficits  Data Reviewed: Basic Metabolic Panel:  Recent Labs Lab 06/15/15 2015 06/16/15 0713 06/17/15 0300  NA 140 140 143  K 4.5 4.0 5.0  CL 107 106 108  CO2 23 24 24   GLUCOSE 146* 163* 121*  BUN 11 12 11   CREATININE 1.08 1.03 1.08  CALCIUM 9.4 8.6* 8.6*  MG  --  1.7  --   PHOS  --  2.9  --    Liver Function Tests:  Recent Labs Lab 06/15/15 2015 06/16/15 0713  AST 26 19  ALT 24 19  ALKPHOS 86 73  BILITOT 0.9 1.1  PROT 8.0 6.6  ALBUMIN 3.2* 2.6*   No results for input(s): LIPASE, AMYLASE in the last 168 hours. No results for input(s): AMMONIA in the last 168 hours. CBC:  Recent Labs Lab 06/15/15 2015  06/16/15 0713 06/17/15 0300  WBC 15.8* 9.9 8.8  NEUTROABS 11.6*  --   --   HGB 14.1 12.5* 12.9*  HCT 43.6 38.6* 38.9*  MCV 89.2 89.4 87.8  PLT 336 287 246   Cardiac Enzymes: No results for input(s): CKTOTAL, CKMB, CKMBINDEX, TROPONINI in the last 168 hours. BNP (last 3 results) No results for input(s): BNP in the last 8760 hours.  ProBNP (last 3 results) No results for input(s): PROBNP  in the last 8760 hours.  CBG:  Recent Labs Lab 06/16/15 1144 06/16/15 1700 06/16/15 1927 06/17/15 0631 06/17/15 1143  GLUCAP 130* 93 144* 123* 120*    Micro Recent Results (from the past 240 hour(s))  Urine culture     Status: None   Collection Time: 06/15/15  7:48 PM  Result Value Ref Range Status   Specimen Description URINE, CLEAN CATCH  Final   Special Requests NONE  Final   Culture NO GROWTH 1 DAY  Final   Report Status 06/16/2015 FINAL  Final  Culture, blood (routine x 2)     Status: None (Preliminary result)   Collection Time: 06/15/15  8:10 PM  Result Value Ref Range Status   Specimen Description BLOOD RIGHT ANTECUBITAL  Final   Special Requests BOTTLES DRAWN AEROBIC AND ANAEROBIC 5CC  Final   Culture NO GROWTH < 24 HOURS  Final   Report Status PENDING  Incomplete  Culture, blood (routine x 2)     Status: None (Preliminary result)   Collection Time: 06/15/15  8:15 PM  Result Value Ref Range Status   Specimen Description BLOOD LEFT ANTECUBITAL  Final   Special Requests BOTTLES DRAWN AEROBIC AND ANAEROBIC 5CC  Final   Culture NO GROWTH < 24 HOURS  Final   Report Status PENDING  Incomplete  Wound culture     Status: None (Preliminary result)   Collection Time: 06/16/15 12:02 AM  Result Value Ref Range Status   Specimen Description LEG  Final   Special Requests EXUDATE LOWER LEG  Final   Gram Stain   Final    RARE WBC PRESENT, PREDOMINANTLY PMN RARE SQUAMOUS EPITHELIAL CELLS PRESENT MODERATE GRAM POSITIVE COCCI IN CLUSTERS Performed at [facility]    Culture   Final    ABUNDANT STAPHYLOCOCCUS AUREUS Note: RIFAMPIN AND GENTAMICIN SHOULD NOT BE USED AS SINGLE DRUGS FOR TREATMENT OF STAPH INFECTIONS. Performed at [facility]    Report Status PENDING  Incomplete     Studies: Dg Chest 2 View  06/15/2015  CLINICAL DATA:  Acute onset of right leg swelling and erythema. Shortness of breath. Cough. Initial encounter. EXAM: CHEST  2 VIEW  COMPARISON:  Chest radiograph from 05/30/2015 FINDINGS: The lungs are well-aerated. Mild left basilar atelectasis is noted. There is no evidence of pleural effusion or pneumothorax. The heart is normal in size; the mediastinal contour is within normal limits. No acute osseous abnormalities are seen. IMPRESSION: Mild left basilar atelectasis noted.  Lungs otherwise clear. Electronically Signed   By: Garald Balding M.D.   On: 06/15/2015 19:59   Dg Tibia/fibula Right  06/16/2015  CLINICAL DATA:  Subacute onset of erythema and swelling about the right lower leg. Initial encounter. EXAM: RIGHT TIBIA AND FIBULA - 2 VIEW COMPARISON:  Right knee radiographs performed 01/24/2014 FINDINGS: There is no evidence of fracture or dislocation. No osseous erosions are seen. The tibia and fibula appear intact. The knee joint is grossly unremarkable, aside from mild cortical irregularity along the articular surface of the  patella. Plantar and posterior calcaneal spurs are seen. Diffuse soft tissue swelling is noted about the right lower leg and ankle. IMPRESSION: 1. No evidence of fracture or dislocation. No osseous erosions seen. 2. Diffuse soft tissue swelling about the right lower leg and ankle. Electronically Signed   By: Garald Balding M.D.   On: 06/16/2015 00:48    Scheduled Meds: . apixaban  10 mg Oral BID   Followed by  . [START ON 06/24/2015] apixaban  5 mg Oral BID  . aspirin EC  81 mg Oral BID  . Influenza vac split quadrivalent PF  0.5 mL Intramuscular Tomorrow-1000  . insulin aspart  0-15 Units Subcutaneous TID WC  . insulin aspart  0-5 Units Subcutaneous QHS  . piperacillin-tazobactam  3.375 g Intravenous 3 times per day  . pneumococcal 23 valent vaccine  0.5 mL Intramuscular Tomorrow-1000  . sodium chloride  3 mL Intravenous Q12H  . vancomycin  1,500 mg Intravenous Q12H   Continuous Infusions:       Time spent: 35 minutes    [facility] A  [facility] Pager [phone] If 7PM-7AM,  please contact night-coverage at [email or web address], password [facility] 06/17/2015, 12:25 PM  LOS: 1 day

## 2015-06-18 LAB — BASIC METABOLIC PANEL
Anion gap: 10 (ref 5–15)
BUN: 14 mg/dL (ref 6–20)
CO2: 27 mmol/L (ref 22–32)
Calcium: 8.8 mg/dL — ABNORMAL LOW (ref 8.9–10.3)
Chloride: 103 mmol/L (ref 101–111)
Creatinine, Ser: 1.11 mg/dL (ref 0.61–1.24)
GFR calc Af Amer: >60 mL/min (ref >60)
GFR calc non Af Amer: >60 mL/min (ref >60)
Glucose, Bld: 125 mg/dL — ABNORMAL HIGH (ref 65–99)
Potassium: 4.4 mmol/L (ref 3.5–5.1)
Sodium: 140 mmol/L (ref 135–145)

## 2015-06-18 LAB — GLUCOSE, CAPILLARY
Glucose-Capillary: 126 mg/dL — ABNORMAL HIGH (ref 65–99)
Glucose-Capillary: 125 mg/dL — ABNORMAL HIGH (ref 65–99)
Glucose-Capillary: 88 mg/dL (ref 65–99)
Glucose-Capillary: 114 mg/dL — ABNORMAL HIGH (ref 65–99)
Glucose-Capillary: 135 mg/dL — ABNORMAL HIGH (ref 65–99)

## 2015-06-18 LAB — WOUND CULTURE

## 2015-06-18 LAB — VANCOMYCIN, TROUGH: Vancomycin Tr: 20 ug/mL (ref 10.0–20.0)

## 2015-06-18 MED ORDER — FUROSEMIDE 10 MG/ML IJ SOLN
60.0000 mg | Freq: Once | INTRAMUSCULAR | Status: AC
  Administered 2015-06-18: 60 mg via INTRAVENOUS
  Filled 2015-06-18: qty 6

## 2015-06-18 MED ORDER — VANCOMYCIN HCL 10 G IV SOLR
1250.0000 mg | Freq: Two times a day (BID) | INTRAVENOUS | Status: DC
  Administered 2015-06-18 – 2015-06-19 (×3): 1250 mg via INTRAVENOUS
  Filled 2015-06-18 (×3): qty 1250

## 2015-06-18 NOTE — Progress Notes (Signed)
[facility] PROGRESS NOTE   Evan Leonard [id number] DOB: 11-May-1961 DOA: 06/15/2015 PCP: Drema Pry, DO  HPI/Subjective: Redness about the same as yesterday, denies any fever or chills. Denies any shortness of breath, denies any palpitations.  Assessment/Plan: Active Problems:   Pulmonary embolism, hx of   Morbid obesity with body mass index of 40.0-44.9 in adult [facility])   Obstructive sleep apnea   Diabetes mellitus with hyperglycemia (HCC)   Cellulitis of right lower extremity   Cellulitis   Sepsis (HCC)    Sepsis syndrome Presented with heart rate of 122, WBC of 15.8 and lactic acid of 2.38 and presence of RLE cellulitis. Patient treated with broad-spectrum antibiotics for the cellulitis. No IV fluids given, patient has bilateral lower extremity swelling. Sepsis physiology resolved  Right lower extremity cellulitis Recurrent right lower x-rays cellulitis likely secondary to venous stasis. Started on vancomycin and Zosyn, still very red and painful/tender Continue current antibiotics, weeping fluids, has bilateral lower extremity edema, started on Lasix.  Acute left lower extremity DVT Patient has had previous DVT in the left lower extremity, treated with several toe for about 9 months. This is almost incidental finding, Doppler was done to check right lower extremity for DVT. It showed acute thrombus in the LLE. Initially started on Lovenox, switched to Eliquis on 06/17/15.  OSA Continue CPAP.  Diabetes mellitus with hyperglycemia Uncontrolled diabetes mellitus type 2 with hemoglobin A1c of 7.5. Carbohydrate modified diet and SSI.   Code Status: Full Code Family Communication: Plan discussed with the patient. Disposition Plan: Remains inpatient Diet: Diet Carb Modified Fluid consistency:: Thin; Room service appropriate?: Yes  Consultants:  None  Procedures:  None  Antibiotics:  Zosyn and vanc   Objective: Filed Vitals:   06/18/15 0452  06/18/15 1224  BP: 115/51 127/74  Pulse: 83 97  Temp: 97.6 F (36.4 C) 98 F (36.7 C)  Resp: 18 20    Intake/Output Summary (Last 24 hours) at 06/18/15 1242 Last data filed at 06/18/15 1225  Gross per 24 hour  Intake   1410 ml  Output   4050 ml  Net  -2640 ml   Filed Weights   06/16/15 0411 06/17/15 0352 06/18/15 0452  Weight: 162.705 kg (358 lb 11.2 oz) 162.388 kg (358 lb) 161.39 kg (355 lb 12.8 oz)    Exam: General: Alert and awake, oriented x3, not in any acute distress. HEENT: anicteric sclera, pupils reactive to light and accommodation, EOMI CVS: S1-S2 clear, no murmur rubs or gallops Chest: clear to auscultation bilaterally, no wheezing, rales or rhonchi Abdomen: soft nontender, nondistended, normal bowel sounds, no organomegaly Extremities: no cyanosis, clubbing or edema noted bilaterally Neuro: Cranial nerves II-XII intact, no focal neurological deficits  Data Reviewed: Basic Metabolic Panel:  Recent Labs Lab 06/15/15 2015 06/16/15 0713 06/17/15 0300 06/18/15 0133  NA 140 140 143 140  K 4.5 4.0 5.0 4.4  CL 107 106 108 103  CO2 23 24 24 27   GLUCOSE 146* 163* 121* 125*  BUN 11 12 11 14   CREATININE 1.08 1.03 1.08 1.11  CALCIUM 9.4 8.6* 8.6* 8.8*  MG  --  1.7  --   --   PHOS  --  2.9  --   --    Liver Function Tests:  Recent Labs Lab 06/15/15 2015 06/16/15 0713  AST 26 19  ALT 24 19  ALKPHOS 86 73  BILITOT 0.9 1.1  PROT 8.0 6.6  ALBUMIN 3.2* 2.6*   No results for input(s): LIPASE, AMYLASE in the  last 168 hours. No results for input(s): AMMONIA in the last 168 hours. CBC:  Recent Labs Lab 06/15/15 2015 06/16/15 0713 06/17/15 0300  WBC 15.8* 9.9 8.8  NEUTROABS 11.6*  --   --   HGB 14.1 12.5* 12.9*  HCT 43.6 38.6* 38.9*  MCV 89.2 89.4 87.8  PLT 336 287 246   Cardiac Enzymes: No results for input(s): CKTOTAL, CKMB, CKMBINDEX, TROPONINI in the last 168 hours. BNP (last 3 results) No results for input(s): BNP in the last 8760  hours.  ProBNP (last 3 results) No results for input(s): PROBNP in the last 8760 hours.  CBG:  Recent Labs Lab 06/17/15 1143 06/17/15 1634 06/17/15 2126 06/18/15 0652 06/18/15 1146  GLUCAP 120* 107* 106* 126* 125*    Micro Recent Results (from the past 240 hour(s))  Urine culture     Status: None   Collection Time: 06/15/15  7:48 PM  Result Value Ref Range Status   Specimen Description URINE, CLEAN CATCH  Final   Special Requests NONE  Final   Culture NO GROWTH 1 DAY  Final   Report Status 06/16/2015 FINAL  Final  Culture, blood (routine x 2)     Status: None (Preliminary result)   Collection Time: 06/15/15  8:10 PM  Result Value Ref Range Status   Specimen Description BLOOD RIGHT ANTECUBITAL  Final   Special Requests BOTTLES DRAWN AEROBIC AND ANAEROBIC 5CC  Final   Culture NO GROWTH 2 DAYS  Final   Report Status PENDING  Incomplete  Culture, blood (routine x 2)     Status: None (Preliminary result)   Collection Time: 06/15/15  8:15 PM  Result Value Ref Range Status   Specimen Description BLOOD LEFT ANTECUBITAL  Final   Special Requests BOTTLES DRAWN AEROBIC AND ANAEROBIC 5CC  Final   Culture NO GROWTH 2 DAYS  Final   Report Status PENDING  Incomplete  Wound culture     Status: None (Preliminary result)   Collection Time: 06/16/15 12:02 AM  Result Value Ref Range Status   Specimen Description LEG  Final   Special Requests EXUDATE LOWER LEG  Final   Gram Stain   Final    RARE WBC PRESENT, PREDOMINANTLY PMN RARE SQUAMOUS EPITHELIAL CELLS PRESENT MODERATE GRAM POSITIVE COCCI IN CLUSTERS Performed at [facility]    Culture   Final    ABUNDANT STAPHYLOCOCCUS AUREUS Note: RIFAMPIN AND GENTAMICIN SHOULD NOT BE USED AS SINGLE DRUGS FOR TREATMENT OF STAPH INFECTIONS. Performed at [facility]    Report Status PENDING  Incomplete     Studies: No results found.  Scheduled Meds: . apixaban  10 mg Oral BID   Followed by  . [START ON 06/24/2015]  apixaban  5 mg Oral BID  . aspirin EC  81 mg Oral BID  . Influenza vac split quadrivalent PF  0.5 mL Intramuscular Tomorrow-1000  . insulin aspart  0-15 Units Subcutaneous TID WC  . insulin aspart  0-5 Units Subcutaneous QHS  . piperacillin-tazobactam  3.375 g Intravenous 3 times per day  . pneumococcal 23 valent vaccine  0.5 mL Intramuscular Tomorrow-1000  . silver sulfADIAZINE   Topical BID  . sodium chloride  3 mL Intravenous Q12H  . vancomycin  1,250 mg Intravenous Q12H   Continuous Infusions:       Time spent: 35 minutes    [facility] A  [facility] Pager [phone] If 7PM-7AM, please contact night-coverage at [email or web address], password [facility] 06/18/2015, 12:42 PM  LOS: 2 days

## 2015-06-18 NOTE — Progress Notes (Signed)
Pharmacy Antibiotic Follow-up Note  Evan Leonard is a 55 y.o. year-old male admitted on 06/15/2015.  The patient is currently on Vancomycin for cellulitis   Assessment/Plan: -Vancomycin trough is slightly above goal, trough is 20 and goal is 10-15 -Decrease vancomycin to 1250 mg IV q12h -Re-check trough at steady state  Temp (24hrs), Avg:98 F (36.7 C), Min:97.9 F (36.6 C), Max:98.2 F (36.8 C)   Recent Labs Lab 06/15/15 2015 06/16/15 0713 06/17/15 0300  WBC 15.8* 9.9 8.8    Recent Labs Lab 06/15/15 2015 06/16/15 0713 06/17/15 0300 06/18/15 0133  CREATININE 1.08 1.03 1.08 1.11   Estimated Creatinine Clearance: 120.1 mL/min (by C-G formula based on Cr of 1.11).    No Known Allergies   Thank you for allowing pharmacy to be a part of this patient's care.  Narda Bonds PharmD 06/18/2015 2:40 AM

## 2015-06-19 LAB — BASIC METABOLIC PANEL
Anion gap: 8 (ref 5–15)
BUN: 13 mg/dL (ref 6–20)
CO2: 30 mmol/L (ref 22–32)
Calcium: 8.7 mg/dL — ABNORMAL LOW (ref 8.9–10.3)
Chloride: 101 mmol/L (ref 101–111)
Creatinine, Ser: 1.15 mg/dL (ref 0.61–1.24)
GFR calc Af Amer: >60 mL/min (ref >60)
GFR calc non Af Amer: >60 mL/min (ref >60)
Glucose, Bld: 130 mg/dL — ABNORMAL HIGH (ref 65–99)
Potassium: 3.6 mmol/L (ref 3.5–5.1)
Sodium: 139 mmol/L (ref 135–145)

## 2015-06-19 LAB — GLUCOSE, CAPILLARY
Glucose-Capillary: 129 mg/dL — ABNORMAL HIGH (ref 65–99)
Glucose-Capillary: 115 mg/dL — ABNORMAL HIGH (ref 65–99)
Glucose-Capillary: 126 mg/dL — ABNORMAL HIGH (ref 65–99)
Glucose-Capillary: 118 mg/dL — ABNORMAL HIGH (ref 65–99)

## 2015-06-19 MED ORDER — FUROSEMIDE 10 MG/ML IJ SOLN
60.0000 mg | Freq: Once | INTRAMUSCULAR | Status: DC
  Filled 2015-06-19: qty 6

## 2015-06-19 MED ORDER — FUROSEMIDE 80 MG PO TABS
80.0000 mg | ORAL_TABLET | Freq: Once | ORAL | Status: AC
  Administered 2015-06-19: 80 mg via ORAL
  Filled 2015-06-19: qty 1

## 2015-06-19 MED ORDER — LEVOFLOXACIN 750 MG PO TABS
750.0000 mg | ORAL_TABLET | ORAL | Status: DC
  Administered 2015-06-19: 750 mg via ORAL
  Filled 2015-06-19: qty 1

## 2015-06-19 MED ORDER — MAGNESIUM SULFATE 2 GM/50ML IV SOLN
2.0000 g | Freq: Once | INTRAVENOUS | Status: DC
  Filled 2015-06-19: qty 50

## 2015-06-19 MED ORDER — MAGNESIUM OXIDE 400 (241.3 MG) MG PO TABS
800.0000 mg | ORAL_TABLET | Freq: Every day | ORAL | Status: DC
  Administered 2015-06-19 – 2015-06-20 (×2): 800 mg via ORAL
  Filled 2015-06-19 (×2): qty 2

## 2015-06-19 MED ORDER — FUROSEMIDE 10 MG/ML IJ SOLN: 40.0000 mg | Freq: Once | INTRAMUSCULAR | Status: DC

## 2015-06-19 MED ORDER — CEFAZOLIN SODIUM-DEXTROSE 2-3 GM-% IV SOLR
2.0000 g | Freq: Three times a day (TID) | INTRAVENOUS | Status: DC
  Filled 2015-06-19 (×3): qty 50

## 2015-06-19 MED ORDER — POTASSIUM CHLORIDE CRYS ER 20 MEQ PO TBCR
40.0000 meq | EXTENDED_RELEASE_TABLET | Freq: Once | ORAL | Status: AC
  Administered 2015-06-19: 40 meq via ORAL
  Filled 2015-06-19: qty 2

## 2015-06-19 NOTE — Progress Notes (Signed)
Patient refusing CPAP tonight. RT made pt aware that if he changed his mind to call.

## 2015-06-19 NOTE — Progress Notes (Signed)
Pharmacy Antibiotic Follow-up Note  Evan Leonard is a 55 y.o. year-old male admitted on 06/15/2015.  The patient is currently on day #5 of abx for cellulitis.  Assessment/Plan: D#5 of vancomycin/zosyn for cellulitis of right leg with evidence of sepsis, h/o recurrent cellulitis, followed by wound care in past. Afebrile, WBC wnl. SCr stable, normalized CrCl ~56ml/min. Cx came back yesterday with MSSA. Evan Leonard and will stop vancomycin and Zosyn and start on cefazolin. Cx was a superficial wound cx so may not be reliable but follow up patient clinical picture. Paged Dr.  Kathyrn Lass: Stop Zosyn and vancomycin Start cefazolin 2g IV Q8 Monitor clinical picture, renal function F/U LOT  Temp (24hrs), Avg:97.8 F (36.6 C), Min:97.5 F (36.4 C), Max:98 F (36.7 C)   Recent Labs Lab 06/15/15 2015 06/16/15 0713 06/17/15 0300  WBC 15.8* 9.9 8.8    Recent Labs Lab 06/15/15 2015 06/16/15 0713 06/17/15 0300 06/18/15 0133 06/19/15 0309  CREATININE 1.08 1.03 1.08 1.11 1.15   Estimated Creatinine Clearance: 114.9 mL/min (by C-G formula based on Cr of 1.15).    No Known Allergies  Antimicrobials this admission: Vanc 1/19 >> Zosyn 1/19 >>  Levels/dose changes this admission: 1/22 = 20 (Decreased to 1250mg  IV Q12)  Microbiology results: 1/19 urine>> negative 1/19 blood x2>> ngtd 1/20 wound >> abundant SA, d/c zosyn?? Although superficial wound cx not very reliable   Thank you for allowing pharmacy to be a part of this patient's care.  Evan Leonard, PharmD, BCPS Clinical Pharmacist Pager [phone] 06/19/2015 7:59 AM

## 2015-06-19 NOTE — Progress Notes (Signed)
[facility] PROGRESS NOTE   Evan Leonard [id number] DOB: 03/07/1961 DOA: 06/15/2015 PCP: Drema Pry, DO  HPI/Subjective: Improved the swelling and redness in the right lower extremity.  Assessment/Plan: Active Problems:   Pulmonary embolism, hx of   Morbid obesity with body mass index of 40.0-44.9 in adult [facility])   Obstructive sleep apnea   Diabetes mellitus with hyperglycemia (HCC)   Cellulitis of right lower extremity   Cellulitis   Sepsis (HCC)    Sepsis syndrome Presented with heart rate of 122, WBC of 15.8 and lactic acid of 2.38 and presence of RLE cellulitis. Patient treated with broad-spectrum antibiotics for the cellulitis. No IV fluids given, patient has bilateral lower extremity swelling. Sepsis physiology resolved  Right lower extremity cellulitis Recurrent right lower x-rays cellulitis likely secondary to venous stasis. Started on vancomycin and Zosyn, still very red and painful/tender Continue current antibiotics, weeping fluids, has bilateral lower extremity edema, started on Lasix.  Acute left lower extremity DVT Patient has had previous PE/DVT in the left lower extremity, treated with Xarelto for about 9 months. This is almost incidental finding, Doppler was done to check right lower extremity for DVT. It showed acute thrombus in the LLE. Initially started on Lovenox, switched to Eliquis on 06/17/15.  OSA Continue CPAP.  Diabetes mellitus with hyperglycemia Uncontrolled diabetes mellitus type 2 with hemoglobin A1c of 7.5. Carbohydrate modified diet and SSI.   Code Status: Full Code Family Communication: Plan discussed with the patient. Disposition Plan: Remains inpatient Diet: Diet Carb Modified Fluid consistency:: Thin; Room service appropriate?: Yes  Consultants:  None  Procedures:  None  Antibiotics:  Zosyn and vanc   Objective: Filed Vitals:   06/18/15 2012 06/19/15 0551  BP: 133/72 131/66  Pulse: 98 89  Temp: 98 F  (36.7 C) 97.5 F (36.4 C)  Resp: 18 18    Intake/Output Summary (Last 24 hours) at 06/19/15 1112 Last data filed at 06/19/15 0951  Gross per 24 hour  Intake   1305 ml  Output   3325 ml  Net  -2020 ml   Filed Weights   06/17/15 0352 06/18/15 0452 06/19/15 0551  Weight: 162.388 kg (358 lb) 161.39 kg (355 lb 12.8 oz) 159.938 kg (352 lb 9.6 oz)    Exam: General: Alert and awake, oriented x3, not in any acute distress. HEENT: anicteric sclera, pupils reactive to light and accommodation, EOMI CVS: S1-S2 clear, no murmur rubs or gallops Chest: clear to auscultation bilaterally, no wheezing, rales or rhonchi Abdomen: soft nontender, nondistended, normal bowel sounds, no organomegaly Extremities: no cyanosis, clubbing or edema noted bilaterally Neuro: Cranial nerves II-XII intact, no focal neurological deficits  Data Reviewed: Basic Metabolic Panel:  Recent Labs Lab 06/15/15 2015 06/16/15 0713 06/17/15 0300 06/18/15 0133 06/19/15 0309  NA 140 140 143 140 139  K 4.5 4.0 5.0 4.4 3.6  CL 107 106 108 103 101  CO2 23 24 24 27 30   GLUCOSE 146* 163* 121* 125* 130*  BUN 11 12 11 14 13   CREATININE 1.08 1.03 1.08 1.11 1.15  CALCIUM 9.4 8.6* 8.6* 8.8* 8.7*  MG  --  1.7  --   --   --   PHOS  --  2.9  --   --   --    Liver Function Tests:  Recent Labs Lab 06/15/15 2015 06/16/15 0713  AST 26 19  ALT 24 19  ALKPHOS 86 73  BILITOT 0.9 1.1  PROT 8.0 6.6  ALBUMIN 3.2* 2.6*   No results  for input(s): LIPASE, AMYLASE in the last 168 hours. No results for input(s): AMMONIA in the last 168 hours. CBC:  Recent Labs Lab 06/15/15 2015 06/16/15 0713 06/17/15 0300  WBC 15.8* 9.9 8.8  NEUTROABS 11.6*  --   --   HGB 14.1 12.5* 12.9*  HCT 43.6 38.6* 38.9*  MCV 89.2 89.4 87.8  PLT 336 287 246   Cardiac Enzymes: No results for input(s): CKTOTAL, CKMB, CKMBINDEX, TROPONINI in the last 168 hours. BNP (last 3 results) No results for input(s): BNP in the last 8760 hours.  ProBNP  (last 3 results) No results for input(s): PROBNP in the last 8760 hours.  CBG:  Recent Labs Lab 06/18/15 1146 06/18/15 1315 06/18/15 1707 06/18/15 2041 06/19/15 0655  GLUCAP 125* 88 114* 135* 129*    Micro Recent Results (from the past 240 hour(s))  Urine culture     Status: None   Collection Time: 06/15/15  7:48 PM  Result Value Ref Range Status   Specimen Description URINE, CLEAN CATCH  Final   Special Requests NONE  Final   Culture NO GROWTH 1 DAY  Final   Report Status 06/16/2015 FINAL  Final  Culture, blood (routine x 2)     Status: None (Preliminary result)   Collection Time: 06/15/15  8:10 PM  Result Value Ref Range Status   Specimen Description BLOOD RIGHT ANTECUBITAL  Final   Special Requests BOTTLES DRAWN AEROBIC AND ANAEROBIC 5CC  Final   Culture NO GROWTH 3 DAYS  Final   Report Status PENDING  Incomplete  Culture, blood (routine x 2)     Status: None (Preliminary result)   Collection Time: 06/15/15  8:15 PM  Result Value Ref Range Status   Specimen Description BLOOD LEFT ANTECUBITAL  Final   Special Requests BOTTLES DRAWN AEROBIC AND ANAEROBIC 5CC  Final   Culture NO GROWTH 3 DAYS  Final   Report Status PENDING  Incomplete  Wound culture     Status: None   Collection Time: 06/16/15 12:02 AM  Result Value Ref Range Status   Specimen Description LEG  Final   Special Requests EXUDATE LOWER LEG  Final   Gram Stain   Final    RARE WBC PRESENT, PREDOMINANTLY PMN RARE SQUAMOUS EPITHELIAL CELLS PRESENT MODERATE GRAM POSITIVE COCCI IN CLUSTERS Performed at [facility]    Culture   Final    ABUNDANT STAPHYLOCOCCUS AUREUS Note: RIFAMPIN AND GENTAMICIN SHOULD NOT BE USED AS SINGLE DRUGS FOR TREATMENT OF STAPH INFECTIONS. This organism DOES NOT demonstrate inducible Clindamycin resistance in vitro. Performed at [facility]    Report Status 06/18/2015 FINAL  Final   Organism ID, Bacteria STAPHYLOCOCCUS AUREUS  Final      Susceptibility    Staphylococcus aureus - MIC*    CLINDAMYCIN <=0.25 SENSITIVE Sensitive     ERYTHROMYCIN >=8 RESISTANT Resistant     GENTAMICIN <=0.5 SENSITIVE Sensitive     LEVOFLOXACIN <=0.12 SENSITIVE Sensitive     OXACILLIN <=0.25 SENSITIVE Sensitive     RIFAMPIN <=0.5 SENSITIVE Sensitive     TRIMETH/SULFA <=10 SENSITIVE Sensitive     VANCOMYCIN 1 SENSITIVE Sensitive     TETRACYCLINE <=1 SENSITIVE Sensitive     MOXIFLOXACIN <=0.25 SENSITIVE Sensitive     * ABUNDANT STAPHYLOCOCCUS AUREUS     Studies: No results found.  Scheduled Meds: . apixaban  10 mg Oral BID   Followed by  . [START ON 06/24/2015] apixaban  5 mg Oral BID  . aspirin EC  81 mg Oral BID  .  ceFAZolin (ANCEF) IV  2 g Intravenous 3 times per day  . insulin aspart  0-15 Units Subcutaneous TID WC  . insulin aspart  0-5 Units Subcutaneous QHS  . silver sulfADIAZINE   Topical BID  . sodium chloride  3 mL Intravenous Q12H   Continuous Infusions:       Time spent: 35 minutes    [facility] A  [facility] Pager [phone] If 7PM-7AM, please contact night-coverage at [email or web address], password [facility] 06/19/2015, 11:12 AM  LOS: 3 days

## 2015-06-19 NOTE — Discharge Instructions (Signed)
Information on my medicine - ELIQUIS (apixaban)  This medication education was reviewed with me or my healthcare representative as part of my discharge preparation.  The pharmacist that spoke with me during my hospital stay was:  Saundra Shelling, [facility]  Why was Eliquis prescribed for you? Eliquis was prescribed to treat blood clots that may have been found in the veins of your legs (deep vein thrombosis) or in your lungs (pulmonary embolism) and to reduce the risk of them occurring again.  What do You need to know about Eliquis ? The starting dose is 10 mg (two 5 mg tablets) taken TWICE daily for the FIRST SEVEN (7) DAYS, then on (enter date)  06/17/15  the dose is reduced to ONE 5 mg tablet taken TWICE daily.  Eliquis may be taken with or without food.   Try to take the dose about the same time in the morning and in the evening. If you have difficulty swallowing the tablet whole please discuss with your pharmacist how to take the medication safely.  Take Eliquis exactly as prescribed and DO NOT stop taking Eliquis without talking to the doctor who prescribed the medication.  Stopping may increase your risk of developing a new blood clot.  Refill your prescription before you run out.  After discharge, you should have regular check-up appointments with your healthcare provider that is prescribing your Eliquis.    What do you do if you miss a dose? If a dose of ELIQUIS is not taken at the scheduled time, take it as soon as possible on the same day and twice-daily administration should be resumed. The dose should not be doubled to make up for a missed dose.  Important Safety Information A possible side effect of Eliquis is bleeding. You should call your healthcare provider right away if you experience any of the following: ? Bleeding from an injury or your nose that does not stop. ? Unusual colored urine (red or dark brown) or unusual colored stools (red or black). ? Unusual bruising for  unknown reasons. ? A serious fall or if you hit your head (even if there is no bleeding).  Some medicines may interact with Eliquis and might increase your risk of bleeding or clotting while on Eliquis. To help avoid this, consult your healthcare provider or pharmacist prior to using any new prescription or non-prescription medications, including herbals, vitamins, non-steroidal anti-inflammatory drugs (NSAIDs) and supplements.  This website has more information on Eliquis (apixaban): http://www.eliquis.com/eliquis/home

## 2015-06-19 NOTE — Progress Notes (Addendum)
Loss of PIV.  Notified Dr. Hartford Poli.  Notified physician that the scheduled IV AM meds could not be administered due to IV loss, after two attempts via IV consult team.  Pt requested PICC, and physician was made aware.  No further orders r/t to this issue at this time.

## 2015-06-20 LAB — CULTURE, BLOOD (ROUTINE X 2)
Culture: NO GROWTH
Culture: NO GROWTH

## 2015-06-20 LAB — BASIC METABOLIC PANEL WITH GFR
Anion gap: 11 (ref 5–15)
BUN: 14 mg/dL (ref 6–20)
CO2: 26 mmol/L (ref 22–32)
Calcium: 8.9 mg/dL (ref 8.9–10.3)
Chloride: 102 mmol/L (ref 101–111)
Creatinine, Ser: 1.01 mg/dL (ref 0.61–1.24)
GFR calc Af Amer: >60 mL/min
GFR calc non Af Amer: >60 mL/min
Glucose, Bld: 140 mg/dL — ABNORMAL HIGH (ref 65–99)
Potassium: 5.2 mmol/L — ABNORMAL HIGH (ref 3.5–5.1)
Sodium: 139 mmol/L (ref 135–145)

## 2015-06-20 LAB — MAGNESIUM: Magnesium: 2.1 mg/dL (ref 1.7–2.4)

## 2015-06-20 LAB — GLUCOSE, CAPILLARY: Glucose-Capillary: 147 mg/dL — ABNORMAL HIGH (ref 65–99)

## 2015-06-20 MED ORDER — APIXABAN 5 MG PO TABS: 5.0000 mg | ORAL_TABLET | Freq: Two times a day (BID) | ORAL | Status: DC

## 2015-06-20 MED ORDER — SILVER SULFADIAZINE 1 % EX CREA: TOPICAL_CREAM | Freq: Two times a day (BID) | CUTANEOUS | Status: DC

## 2015-06-20 MED ORDER — FUROSEMIDE 40 MG PO TABS: 40.0000 mg | ORAL_TABLET | Freq: Every day | ORAL | Status: DC

## 2015-06-20 MED ORDER — LEVOFLOXACIN 750 MG PO TABS: 750.0000 mg | ORAL_TABLET | ORAL | Status: DC

## 2015-06-20 NOTE — Progress Notes (Signed)
PT Cancellation Note  Patient Details Name: Evan Leonard MRN: [id number] DOB: May 25, 1961   Cancelled Treatment:    Reason Eval/Treat Not Completed: PT screened, no needs identified, will sign off. Spoke with pt and OT and pt is moving at baseline level. He reports he as all DME he would need and lives in 1 level home with no steps.  Will d/c. Please re-order if status changes. Thank you for the referral.  Santiago Glad L. Tamala Julian, [place] Pager [phone] 06/20/2015   Yuchen Fedor LUBECK 06/20/2015, 8:50 AM

## 2015-06-20 NOTE — Discharge Summary (Signed)
Physician Discharge Summary  Evan Leonard [id number] DOB: December 28, 1960 DOA: 06/15/2015  PCP: Drema Pry, DO  Admit date: 06/15/2015 Discharge date: 06/20/2015  Time spent: 40 minutes  Recommendations for Outpatient Follow-up:  1. Complete course of PO levofloxacin x7 more days  2. Follow-up with PCP in 1-2 weeks 3. Started on Eliquis for left lower extremity DVT, Lasix for chronic bilateral lower extremity swelling.  History of present illness:  Evan Leonard is a 55 year old male with history of DM2, HTN, recurrent cellulitis, and prior PE/DVT who presented due to worsening RLE edema, erythema and weeping. In the ED he met sepsis criteria with heart rate of 122, WBC of 15.8 and lactic acid of 2.38 and presence of RLE cellulitis. He was started on Vancomycin and Zosyn for coverage and admitted for continued treatment.  Discharge Diagnoses:  Active Problems:   Pulmonary embolism, hx of   Morbid obesity with body mass index of 40.0-44.9 in adult [facility])   Obstructive sleep apnea   Diabetes mellitus with hyperglycemia (HCC)   Cellulitis of right lower extremity   Cellulitis   Sepsis [facility])  Hospital Course:   Sepsis syndrome Presented with heart rate of 122, WBC of 15.8 and lactic acid of 2.38 and presence of RLE cellulitis. No IV fluids given, patient has bilateral lower extremity swelling. Sepsis physiology has since resolved, treating with culture guided antibiotics. Blood cultures negative to date.  Right lower extremity cellulitis Recurrent right lower x-rays cellulitis likely secondary to venous stasis. Wound cultures grew Staph Aureus, sensitivities noted, the patient had been transitioned to PO Levaquin Pain and erythema improved since admission, he has been able to ambulate without difficulty. Started on Lasix for edema.  Acute left lower extremity DVT Patient has had previous PE/DVT in the left lower extremity, treated with Xarelto for about 9 months. This is almost  incidental finding, Doppler was done to check RLE for DVT but showed acute thrombus in the LLE. The patient is completely asymptomatic - no LLE calf pain, chest pain, dyspnea, or DOE Initially started on Lovenox, switched to Eliquis 06/17/15. Duration of therapy can be addressed as outpatient. Consider Bilateral lower extremity Doppler in 3-4 months.  OSA Managed with CPAP.  Diabetes mellitus with hyperglycemia Uncontrolled diabetes mellitus type 2 with hemoglobin A1c of 7.5. Carbohydrate modified diet and SSI.   Procedures:  BLE Doppler: (06/16/2015) Summary: - No evidence of deep vein or superficial thrombosis involving the right lower extremity and left common femoral vein. - Findings consistent with acute deep vein thrombosis involving the left lower extremity. - Findings consistent with ACUTE thrombosis involving the left gastrocnemius vein.prox calf area. - Other findings in right groin -lymph node.-3.6cm x 3.6 cm.  Consultations:  None  Discharge Condition: Stable  Diet recommendation: Carb modified  Filed Weights   06/18/15 0452 06/19/15 0551 06/20/15 0634  Weight: 161.39 kg (355 lb 12.8 oz) 159.938 kg (352 lb 9.6 oz) 157.625 kg (347 lb 8 oz)    Discharge Exam: Filed Vitals:   06/19/15 1944 06/20/15 0634  BP: 106/76 131/86  Pulse: 65 103  Temp: 98.7 F (37.1 C) 98.4 F (36.9 C)  Resp: 18 21    General: A&Ox3, NAD Cardiovascular: RRR Respiratory: CTAB, no wheezing Skin: RLE erythema, edema, improved since admission. No weeping. LLE with mild edema but no erythema or calf tenderness  On admission    On discharge    Discharge Instructions   Discharge Instructions    Diet - low sodium heart healthy  Complete by:  As directed      Increase activity slowly    Complete by:  As directed           Current Discharge Medication List    START taking these medications   Details  apixaban (ELIQUIS) 5 MG TABS tablet Take 1 tablet (5 mg total) by mouth  2 (two) times daily. Qty: 60 tablet, Refills: 3    furosemide (LASIX) 40 MG tablet Take 1 tablet (40 mg total) by mouth daily. Qty: 30 tablet, Refills: 0    levofloxacin (LEVAQUIN) 750 MG tablet Take 1 tablet (750 mg total) by mouth daily. Qty: 7 tablet, Refills: 0    silver sulfADIAZINE (SILVADENE) 1 % cream Apply topically 2 (two) times daily. Qty: 50 g, Refills: 0      CONTINUE these medications which have NOT CHANGED   Details  metFORMIN (GLUCOPHAGE) 500 MG tablet TAKE 1 TABLET (500 MG TOTAL) BY MOUTH 2 (TWO) TIMES DAILY WITH A MEAL. Qty: 60 tablet, Refills: 5    Multiple Vitamin (MULTIVITAMIN WITH MINERALS) TABS tablet Take 1 tablet by mouth daily.       STOP taking these medications     aspirin EC 81 MG tablet        No Known Allergies Follow-up Information    Follow up with Drema Pry, DO. Go on 06/23/2015.   Specialty:  Internal Medicine   Why:  @ 8:30am    Contact information:   [place] [address] [phone]        The results of significant diagnostics from this hospitalization (including imaging, microbiology, ancillary and laboratory) are listed below for reference.    Significant Diagnostic Studies: Dg Chest 2 View  06/15/2015  CLINICAL DATA:  Acute onset of right leg swelling and erythema. Shortness of breath. Cough. Initial encounter. EXAM: CHEST  2 VIEW COMPARISON:  Chest radiograph from 05/30/2015 FINDINGS: The lungs are well-aerated. Mild left basilar atelectasis is noted. There is no evidence of pleural effusion or pneumothorax. The heart is normal in size; the mediastinal contour is within normal limits. No acute osseous abnormalities are seen. IMPRESSION: Mild left basilar atelectasis noted.  Lungs otherwise clear. Electronically Signed   By: Garald Balding M.D.   On: 06/15/2015 19:59   Dg Tibia/fibula Right  06/16/2015  CLINICAL DATA:  Subacute onset of erythema and swelling about the right lower leg. Initial encounter.  EXAM: RIGHT TIBIA AND FIBULA - 2 VIEW COMPARISON:  Right knee radiographs performed 01/24/2014 FINDINGS: There is no evidence of fracture or dislocation. No osseous erosions are seen. The tibia and fibula appear intact. The knee joint is grossly unremarkable, aside from mild cortical irregularity along the articular surface of the patella. Plantar and posterior calcaneal spurs are seen. Diffuse soft tissue swelling is noted about the right lower leg and ankle. IMPRESSION: 1. No evidence of fracture or dislocation. No osseous erosions seen. 2. Diffuse soft tissue swelling about the right lower leg and ankle. Electronically Signed   By: Garald Balding M.D.   On: 06/16/2015 00:48   Ct Abdomen Pelvis W Contrast  05/30/2015  CLINICAL DATA:  Left-sided abdominal pain. EXAM: CT ABDOMEN AND PELVIS WITH CONTRAST TECHNIQUE: Multidetector CT imaging of the abdomen and pelvis was performed using the standard protocol following bolus administration of intravenous contrast. CONTRAST:  147m OMNIPAQUE IOHEXOL 300 MG/ML  SOLN COMPARISON:  CT 12/10/2013 FINDINGS: Lower chest: Lung bases are clear. Hepatobiliary: No focal hepatic lesion. Diffuse low-attenuation within liver  suggests hepatic steatosis No biliary duct dilatation. Gallbladder is normal. Common bile duct is normal. Pancreas: Pancreas is normal. No ductal dilatation. No pancreatic inflammation. Spleen: Normal spleen Adrenals/urinary tract: Adrenal glands are normal. Several nonobstructing calculi within the RIGHT kidney range in size from 4 to 8 mm. There is a high-density round lesion extending from lower pole of the RIGHT kidney measuring 20 mm on image 51, series 201. This compares to 20 mm on comparison exam from 12/10/2013. Small calculi are in the lower pole of the LEFT kidney additionally. No ureterolithiasis or obstructive uropathy. No bladder calculi. Stomach/Bowel: Stomach is normal. Duodenum is normal. There is a ventral abdominal hernia which contains a  short segment of small bowel (image 105, series 204; image 75, series 201). This is site of a larger hernia or seen on comparison exam. The small bowel proximal to the hernia is mildly dilated to 38 mm while the small bowel distal to the hernia is collapsed at 10 mm. This obstruction occurs in the mid small bowel. The small bowel is collapsed up to the terminal ileum. The ascending, transverse, and descending colon are collapsed. No intraperitoneal free air.  No pneumatosis. Vascular/Lymphatic: Abdominal aorta is normal caliber. There is no retroperitoneal or periportal lymphadenopathy. No pelvic lymphadenopathy. Reproductive: Prostate normal. Other: No free fluid. Musculoskeletal: No aggressive osseous lesion. IMPRESSION: 1. SMALL BOWEL OBSTRUCTION at the site of a loop of small bowel entering a recurrent midline abdominal wall hernia. Minimal small bowel dilatation proximal to the obstructing hernia. 2. Hepatic steatosis. 3. Stable indeterminate exophytic RIGHT renal lesion and bilateral renal calculi. Electronically Signed   By: Suzy Bouchard M.D.   On: 05/30/2015 22:47   Dg Abd Acute W/chest  05/30/2015  CLINICAL DATA:  Pt c/o some nausea and vomiting, left side abdominal pain and cramping x 4 days; pt sts "tightness in legs today" and painful to stand. Hx PE in 2013, DVT, diabetic. EXAM: DG ABDOMEN ACUTE W/ 1V CHEST COMPARISON:  12/18/2013 FINDINGS: Shallow lung inflation. Mildly prominent interstitial markings and atelectasis at the left lung base. Dilated loops of small bowel are identified in the central and lower abdomen. Gas-filled large bowel loops are not dilated. Moderate stool burden. No free intraperitoneal air. IMPRESSION: 1. Shallow lung inflation. 2. Partial or early small bowel obstruction. 3. Moderate stool burden. Electronically Signed   By: Nolon Nations M.D.   On: 05/30/2015 20:54    Microbiology: Recent Results (from the past 240 hour(s))  Urine culture     Status: None    Collection Time: 06/15/15  7:48 PM  Result Value Ref Range Status   Specimen Description URINE, CLEAN CATCH  Final   Special Requests NONE  Final   Culture NO GROWTH 1 DAY  Final   Report Status 06/16/2015 FINAL  Final  Culture, blood (routine x 2)     Status: None (Preliminary result)   Collection Time: 06/15/15  8:10 PM  Result Value Ref Range Status   Specimen Description BLOOD RIGHT ANTECUBITAL  Final   Special Requests BOTTLES DRAWN AEROBIC AND ANAEROBIC 5CC  Final   Culture NO GROWTH 4 DAYS  Final   Report Status PENDING  Incomplete  Culture, blood (routine x 2)     Status: None (Preliminary result)   Collection Time: 06/15/15  8:15 PM  Result Value Ref Range Status   Specimen Description BLOOD LEFT ANTECUBITAL  Final   Special Requests BOTTLES DRAWN AEROBIC AND ANAEROBIC 5CC  Final   Culture NO GROWTH  4 DAYS  Final   Report Status PENDING  Incomplete  Wound culture     Status: None   Collection Time: 06/16/15 12:02 AM  Result Value Ref Range Status   Specimen Description LEG  Final   Special Requests EXUDATE LOWER LEG  Final   Gram Stain   Final    RARE WBC PRESENT, PREDOMINANTLY PMN RARE SQUAMOUS EPITHELIAL CELLS PRESENT MODERATE GRAM POSITIVE COCCI IN CLUSTERS Performed at [facility]    Culture   Final    ABUNDANT STAPHYLOCOCCUS AUREUS Note: RIFAMPIN AND GENTAMICIN SHOULD NOT BE USED AS SINGLE DRUGS FOR TREATMENT OF STAPH INFECTIONS. This organism DOES NOT demonstrate inducible Clindamycin resistance in vitro. Performed at [facility]    Report Status 06/18/2015 FINAL  Final   Organism ID, Bacteria STAPHYLOCOCCUS AUREUS  Final      Susceptibility   Staphylococcus aureus - MIC*    CLINDAMYCIN <=0.25 SENSITIVE Sensitive     ERYTHROMYCIN >=8 RESISTANT Resistant     GENTAMICIN <=0.5 SENSITIVE Sensitive     LEVOFLOXACIN <=0.12 SENSITIVE Sensitive     OXACILLIN <=0.25 SENSITIVE Sensitive     RIFAMPIN <=0.5 SENSITIVE Sensitive     TRIMETH/SULFA <=10  SENSITIVE Sensitive     VANCOMYCIN 1 SENSITIVE Sensitive     TETRACYCLINE <=1 SENSITIVE Sensitive     MOXIFLOXACIN <=0.25 SENSITIVE Sensitive     * ABUNDANT STAPHYLOCOCCUS AUREUS     Labs: Basic Metabolic Panel:  Recent Labs Lab 06/16/15 0713 06/17/15 0300 06/18/15 0133 06/19/15 0309 06/20/15 0514  NA 140 143 140 139 139  K 4.0 5.0 4.4 3.6 5.2*  CL 106 108 103 101 102  CO2 '24 24 27 30 26  '$ GLUCOSE 163* 121* 125* 130* 140*  BUN '12 11 14 13 14  '$ CREATININE 1.03 1.08 1.11 1.15 1.01  CALCIUM 8.6* 8.6* 8.8* 8.7* 8.9  MG 1.7  --   --   --  2.1  PHOS 2.9  --   --   --   --    Liver Function Tests:  Recent Labs Lab 06/15/15 2015 06/16/15 0713  AST 26 19  ALT 24 19  ALKPHOS 86 73  BILITOT 0.9 1.1  PROT 8.0 6.6  ALBUMIN 3.2* 2.6*   No results for input(s): LIPASE, AMYLASE in the last 168 hours. No results for input(s): AMMONIA in the last 168 hours. CBC:  Recent Labs Lab 06/15/15 2015 06/16/15 0713 06/17/15 0300  WBC 15.8* 9.9 8.8  NEUTROABS 11.6*  --   --   HGB 14.1 12.5* 12.9*  HCT 43.6 38.6* 38.9*  MCV 89.2 89.4 87.8  PLT 336 287 246   Cardiac Enzymes: No results for input(s): CKTOTAL, CKMB, CKMBINDEX, TROPONINI in the last 168 hours. BNP: BNP (last 3 results) No results for input(s): BNP in the last 8760 hours.  ProBNP (last 3 results) No results for input(s): PROBNP in the last 8760 hours.  CBG:  Recent Labs Lab 06/19/15 0655 06/19/15 1147 06/19/15 1618 06/19/15 2035 06/20/15 0637  GLUCAP 129* 115* 126* 118* 147*       Signed:  Paige Horcher PA-S wrote parts of this note.   [facility] 06/20/2015, 10:20 AM  Birdie Hopes Pager: [phone] 06/20/2015, 1:36 PM

## 2015-06-20 NOTE — Evaluation (Signed)
Occupational Therapy Evaluation Patient Details Name: Evan Leonard MRN: [id number] DOB: 16-Dec-1960 Today's Date: 06/20/2015    History of Present Illness 55 yo male admitted with cellulitis R LE PMH DM2 DVT L LE    Clinical Impression   Patient evaluated by Occupational Therapy with no further acute OT needs identified. All education has been completed and the patient has no further questions. See below for any follow-up Occupational Therapy or equipment needs. OT to sign off. Thank you for referral.      Follow Up Recommendations  No OT follow up    Equipment Recommendations  None recommended by OT    Recommendations for Other Services       Precautions / Restrictions Precautions Precautions: None      Mobility Bed Mobility Overal bed mobility: Independent                Transfers Overall transfer level: Independent                    Balance                                            ADL Overall ADL's : At baseline;Independent                                       General ADL Comments: completed full adl at sink level      Vision     Perception     Praxis      Pertinent Vitals/Pain Pain Assessment: No/denies pain     Hand Dominance Right   Extremity/Trunk Assessment Upper Extremity Assessment Upper Extremity Assessment: Overall WFL for tasks assessed   Lower Extremity Assessment Lower Extremity Assessment: RLE deficits/detail RLE Deficits / Details: cellulitis, weeping wounds, wears ted hose normally but currently doff this admission   Cervical / Trunk Assessment Cervical / Trunk Assessment: Kyphotic   Communication Communication Communication: No difficulties   Cognition Arousal/Alertness: Awake/alert Behavior During Therapy: WFL for tasks assessed/performed Overall Cognitive Status: Within Functional Limits for tasks assessed                     General Comments        Exercises       Shoulder Instructions      Home Living Family/patient expects to be discharged to:: Private residence Living Arrangements: Alone   Type of Home: Apartment Home Access: Level entry     Home Layout: One level     Bathroom Shower/Tub: Occupational psychologist: Standard     Home Equipment: Environmental consultant - 2 wheels;Cane - single point;Bedside commode          Prior Functioning/Environment Level of Independence: Independent        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 Diagnosis:     OT Problem List:     OT Treatment/Interventions:      OT Goals(Current goals can be found in the care plan section)    OT Frequency:     Barriers to D/C:            Co-evaluation              End of Session Nurse Communication: Mobility status;Precautions  Activity  Tolerance: Patient tolerated treatment well Patient left: in chair;with call bell/phone within reach   Time: 0721-0746 OT Time Calculation (min): 25 min Charges:  OT General Charges $OT Visit: 1 Procedure OT Evaluation $OT Eval Low Complexity: 1 Procedure G-Codes:    Parke Poisson B 2015-07-02, 7:54 AM  Jeri Modena   OTR/L Pager: [phone] Office: [phone] .

## 2015-06-20 NOTE — Care Management Note (Addendum)
Case Management Note  Patient Details  Name: Evan Leonard MRN: [id number] Date of Birth: 20-Mar-1961  Subjective/Objective:    Patient is from home pta indep, per pt eval no pt needs.  Patient is for dc today. Patient is on Eliquis which is new for him, NCM awaiting benefit check , gave patient 30 day trial savings card and $10 co pay savings card.  Await benefit check.   Per Benefit check, states MD has already called in at [facility] and it is $118.30, he has the coupons that NCM gave him which will help with the cost, informed Research scientist (medical).               Action/Plan:   Expected Discharge Date:  06/19/15               Expected Discharge Plan:  Home/Self Care  In-House Referral:     Discharge planning Services  CM Consult  Post Acute Care Choice:    Choice offered to:     DME Arranged:    DME Agency:     HH Arranged:    HH Agency:     Status of Service:  Completed, signed off  Medicare Important Message Given:    Date Medicare IM Given:    Medicare IM give by:    Date Additional Medicare IM Given:    Additional Medicare Important Message give by:     If discussed at [place] of Stay Meetings, dates discussed:    Additional Comments:  [name], RN 06/20/2015, 10:44 AM

## 2015-06-21 ENCOUNTER — Telehealth: Payer: Self-pay | Admitting: *Deleted

## 2015-06-21 NOTE — Telephone Encounter (Signed)
Transitional Care Management - 1st attempt to call patient Left message on machine for patient.

## 2015-06-21 NOTE — Telephone Encounter (Signed)
Transition Care Management Follow-up Telephone Call  How have you been since you were released from the hospital? okay   Do you understand why you were in the hospital? yes   Do you understand the discharge instrcutions? yes  Items Reviewed:  Medications reviewed: yes  Allergies reviewed: yes  Dietary changes reviewed: yes  Referrals reviewed: yes   Functional Questionnaire:   Activities of Daily Living (ADLs):   He states they are independent in the following: ambulation, bathing and hygiene, feeding, continence, grooming, toileting and dressing States they require assistance with the following: none   Any transportation issues/concerns?: no   Any patient concerns? no   Confirmed importance and date/time of follow-up visits scheduled: yes   Confirmed with patient if condition begins to worsen call PCP or go to the ER.  Patient was given the Call-a-Nurse line [phone]: yes Patient was discharged 06/20/15 Patient was discharged to home Patient has an appointment 06/23/15 with Dr Sarajane Jews

## 2015-06-23 ENCOUNTER — Ambulatory Visit (INDEPENDENT_AMBULATORY_CARE_PROVIDER_SITE_OTHER): Payer: PRIVATE HEALTH INSURANCE | Admitting: Family Medicine

## 2015-06-23 ENCOUNTER — Encounter: Payer: Self-pay | Admitting: Family Medicine

## 2015-06-23 VITALS — BP 138/88 | HR 107 | Temp 98.5°F | Ht 74.0 in | Wt 353.0 lb

## 2015-06-23 DIAGNOSIS — R600 Localized edema: Secondary | ICD-10-CM

## 2015-06-23 DIAGNOSIS — I82432 Acute embolism and thrombosis of left popliteal vein: Secondary | ICD-10-CM | POA: Diagnosis not present

## 2015-06-23 DIAGNOSIS — Z6841 Body Mass Index (BMI) 40.0 and over, adult: Secondary | ICD-10-CM | POA: Diagnosis not present

## 2015-06-23 DIAGNOSIS — L03115 Cellulitis of right lower limb: Secondary | ICD-10-CM | POA: Diagnosis not present

## 2015-06-23 MED ORDER — FUROSEMIDE 40 MG PO TABS: 40.0000 mg | ORAL_TABLET | Freq: Every day | ORAL | Status: DC

## 2015-06-23 NOTE — Progress Notes (Signed)
Pre visit review using our clinic review tool, if applicable. No additional management support is needed unless otherwise documented below in the visit note. 

## 2015-06-23 NOTE — Progress Notes (Signed)
   Subjective:    Patient ID: Evan Leonard, male    DOB: 1961-01-06, 55 y.o.   MRN: [id number]  HPI Here to follow up from a hospital stay from 06-15-15 to 06-20-15 for right leg cellulitis. Cultures grew Stap aureus, and he responded well to IV Vancomycin and Zosyn. He was then sent home on oral Levaquin. He is also dressing the leg BID with Silvadene cream and wearing thigh high stockings. Blood cultures remained negative. He was also found to ave a DVT in the left calf, so he was started on Eliquis. Since going home he has done well. No chest pain or SOB or fever. He has no pain or discomfort in either leg. The redness is resolving in the right leg.    Review of Systems  Constitutional: Negative.   Respiratory: Negative.   Cardiovascular: Positive for leg swelling. Negative for chest pain and palpitations.  Skin: Positive for color change.  Neurological: Negative.        Objective:   Physical Exam  Constitutional: He is oriented to person, place, and time. He appears well-developed and well-nourished.  Neck: No thyromegaly present.  Cardiovascular: Normal rate, regular rhythm, normal heart sounds and intact distal pulses.   Pulmonary/Chest: Effort normal and breath sounds normal.  Musculoskeletal:  2+ edema to both lower legs   Lymphadenopathy:    He has no cervical adenopathy.  Neurological: He is alert and oriented to person, place, and time.  Skin:  Right lower leg has mild erythema, no tenderness           Assessment & Plan:  His cellulitis is responding well to treatment. He will finish out the last 3 days of Levaquin. He will continue to shower once daily and dress the right leg BID with Silvadene until his next visit. I asked him to follow up here in 2 weeks. He will stay on Lasix 40 mg daily, and a BMET should be checked at the next OV to follow his potassium level. He will stay on Eliquis, and since he has now had 2 episodes of DVTs (one of which led to a PE), I believe  he should remain on an anticoagulant medication permanently.

## 2015-07-12 ENCOUNTER — Ambulatory Visit: Payer: PRIVATE HEALTH INSURANCE | Admitting: Internal Medicine

## 2015-07-12 ENCOUNTER — Ambulatory Visit (INDEPENDENT_AMBULATORY_CARE_PROVIDER_SITE_OTHER): Payer: PRIVATE HEALTH INSURANCE | Admitting: Family Medicine

## 2015-07-12 ENCOUNTER — Encounter: Payer: Self-pay | Admitting: Family Medicine

## 2015-07-12 VITALS — BP 138/86 | HR 91 | Temp 98.8°F | Ht 74.0 in | Wt 356.0 lb

## 2015-07-12 DIAGNOSIS — L03115 Cellulitis of right lower limb: Secondary | ICD-10-CM | POA: Diagnosis not present

## 2015-07-12 DIAGNOSIS — I82432 Acute embolism and thrombosis of left popliteal vein: Secondary | ICD-10-CM | POA: Diagnosis not present

## 2015-07-12 DIAGNOSIS — R600 Localized edema: Secondary | ICD-10-CM | POA: Diagnosis not present

## 2015-07-12 LAB — BASIC METABOLIC PANEL
BUN: 16 mg/dL (ref 6–23)
CO2: 28 mEq/L (ref 19–32)
Calcium: 9.3 mg/dL (ref 8.4–10.5)
Chloride: 103 mEq/L (ref 96–112)
Creatinine, Ser: 0.89 mg/dL (ref 0.40–1.50)
GFR: 93.92 mL/min (ref >60.00)
Glucose, Bld: 154 mg/dL — ABNORMAL HIGH (ref 70–99)
Potassium: 4.3 mEq/L (ref 3.5–5.1)
Sodium: 138 mEq/L (ref 135–145)

## 2015-07-12 NOTE — Progress Notes (Signed)
   Subjective:    Patient ID: Evan Leonard, male    DOB: 1961-02-03, 55 y.o.   MRN: [id number]  HPI Here to follow up right leg cellulitis. He has finished the Levaquin, and he feels that the infection has totally resolved. There is no redness or discomfort. He is still dressing the leg daily with Silvadene. He is taking lasix 40 mg daily and this definitely has a diuretic effect on him. His BP is stable.    Review of Systems  Constitutional: Negative.   Respiratory: Negative.   Cardiovascular: Positive for leg swelling. Negative for chest pain and palpitations.  Skin: Negative.        Objective:   Physical Exam  Constitutional: He is oriented to person, place, and time. He appears well-developed and well-nourished.  Cardiovascular: Normal rate, regular rhythm, normal heart sounds and intact distal pulses.   Pulmonary/Chest: Effort normal and breath sounds normal.  Musculoskeletal:  3+ edema to both lower legs   Neurological: He is alert and oriented to person, place, and time.  Skin: No erythema.          Assessment & Plan:  His cellulitis has resolved. HTN is stable. We will check a BMET today to follow renal status and potassium.

## 2015-07-12 NOTE — Progress Notes (Signed)
Pre visit review using our clinic review tool, if applicable. No additional management support is needed unless otherwise documented below in the visit note. 

## 2015-09-06 ENCOUNTER — Inpatient Hospital Stay (HOSPITAL_COMMUNITY)
Admission: EM | Admit: 2015-09-06 | Discharge: 2015-09-10 | DRG: 394 | Disposition: A | Discharge status: Home / Self Care | Payer: PRIVATE HEALTH INSURANCE | Attending: Family Medicine | Admitting: Family Medicine

## 2015-09-06 ENCOUNTER — Emergency Department (HOSPITAL_COMMUNITY): Payer: PRIVATE HEALTH INSURANCE

## 2015-09-06 ENCOUNTER — Encounter (HOSPITAL_COMMUNITY): Payer: Self-pay | Admitting: *Deleted

## 2015-09-06 DIAGNOSIS — R109 Unspecified abdominal pain: Secondary | ICD-10-CM | POA: Diagnosis present

## 2015-09-06 DIAGNOSIS — D72829 Elevated white blood cell count, unspecified: Secondary | ICD-10-CM | POA: Diagnosis present

## 2015-09-06 DIAGNOSIS — Z86718 Personal history of other venous thrombosis and embolism: Secondary | ICD-10-CM | POA: Diagnosis not present

## 2015-09-06 DIAGNOSIS — E1165 Type 2 diabetes mellitus with hyperglycemia: Secondary | ICD-10-CM | POA: Diagnosis present

## 2015-09-06 DIAGNOSIS — Z6841 Body Mass Index (BMI) 40.0 and over, adult: Secondary | ICD-10-CM

## 2015-09-06 DIAGNOSIS — K430 Incisional hernia with obstruction, without gangrene: Secondary | ICD-10-CM | POA: Diagnosis present

## 2015-09-06 DIAGNOSIS — Z86711 Personal history of pulmonary embolism: Secondary | ICD-10-CM | POA: Diagnosis not present

## 2015-09-06 DIAGNOSIS — I2699 Other pulmonary embolism without acute cor pulmonale: Secondary | ICD-10-CM | POA: Diagnosis present

## 2015-09-06 DIAGNOSIS — K5669 Other intestinal obstruction: Secondary | ICD-10-CM | POA: Diagnosis not present

## 2015-09-06 DIAGNOSIS — I82432 Acute embolism and thrombosis of left popliteal vein: Secondary | ICD-10-CM | POA: Diagnosis not present

## 2015-09-06 DIAGNOSIS — R112 Nausea with vomiting, unspecified: Secondary | ICD-10-CM | POA: Diagnosis present

## 2015-09-06 DIAGNOSIS — Z7901 Long term (current) use of anticoagulants: Secondary | ICD-10-CM | POA: Diagnosis not present

## 2015-09-06 DIAGNOSIS — Z7984 Long term (current) use of oral hypoglycemic drugs: Secondary | ICD-10-CM | POA: Diagnosis not present

## 2015-09-06 DIAGNOSIS — I82439 Acute embolism and thrombosis of unspecified popliteal vein: Secondary | ICD-10-CM | POA: Diagnosis present

## 2015-09-06 DIAGNOSIS — G4733 Obstructive sleep apnea (adult) (pediatric): Secondary | ICD-10-CM | POA: Diagnosis present

## 2015-09-06 DIAGNOSIS — K56609 Unspecified intestinal obstruction, unspecified as to partial versus complete obstruction: Secondary | ICD-10-CM

## 2015-09-06 HISTORY — DX: Dependence on other enabling machines and devices: Z99.89

## 2015-09-06 HISTORY — DX: Obstructive sleep apnea (adult) (pediatric): G47.33

## 2015-09-06 HISTORY — DX: Unspecified intestinal obstruction, unspecified as to partial versus complete obstruction: K56.609

## 2015-09-06 LAB — URINE MICROSCOPIC-ADD ON

## 2015-09-06 LAB — URINALYSIS, ROUTINE W REFLEX MICROSCOPIC
Glucose, UA: NEGATIVE mg/dL
Ketones, ur: NEGATIVE mg/dL
Leukocytes, UA: NEGATIVE
Nitrite: NEGATIVE
Protein, ur: NEGATIVE mg/dL
Specific Gravity, Urine: 1.022 (ref 1.005–1.030)
pH: 5 (ref 5.0–8.0)

## 2015-09-06 LAB — COMPREHENSIVE METABOLIC PANEL
ALT: 25 U/L (ref 17–63)
AST: 21 U/L (ref 15–41)
Albumin: 3.4 g/dL — ABNORMAL LOW (ref 3.5–5.0)
Alkaline Phosphatase: 73 U/L (ref 38–126)
Anion gap: 11 (ref 5–15)
BUN: 16 mg/dL (ref 6–20)
CO2: 23 mmol/L (ref 22–32)
Calcium: 9.4 mg/dL (ref 8.9–10.3)
Chloride: 106 mmol/L (ref 101–111)
Creatinine, Ser: 0.89 mg/dL (ref 0.61–1.24)
GFR calc Af Amer: >60 mL/min (ref >60)
GFR calc non Af Amer: >60 mL/min (ref >60)
Glucose, Bld: 206 mg/dL — ABNORMAL HIGH (ref 65–99)
Potassium: 4.6 mmol/L (ref 3.5–5.1)
Sodium: 140 mmol/L (ref 135–145)
Total Bilirubin: 1 mg/dL (ref 0.3–1.2)
Total Protein: 8.4 g/dL — ABNORMAL HIGH (ref 6.5–8.1)

## 2015-09-06 LAB — CBC
HCT: 44.5 % (ref 39.0–52.0)
Hemoglobin: 14.4 g/dL (ref 13.0–17.0)
MCH: 28 pg (ref 26.0–34.0)
MCHC: 32.4 g/dL (ref 30.0–36.0)
MCV: 86.6 fL (ref 78.0–100.0)
Platelets: 245 10*3/uL (ref 150–400)
RBC: 5.14 MIL/uL (ref 4.22–5.81)
RDW: 15.2 % (ref 11.5–15.5)
WBC: 17.2 10*3/uL — ABNORMAL HIGH (ref 4.0–10.5)

## 2015-09-06 LAB — I-STAT CG4 LACTIC ACID, ED: Lactic Acid, Venous: 1.27 mmol/L (ref 0.5–2.0)

## 2015-09-06 LAB — GLUCOSE, CAPILLARY: Glucose-Capillary: 150 mg/dL — ABNORMAL HIGH (ref 65–99)

## 2015-09-06 LAB — LIPASE, BLOOD: Lipase: 21 U/L (ref 11–51)

## 2015-09-06 MED ORDER — ACETAMINOPHEN 325 MG PO TABS: 650.0000 mg | ORAL_TABLET | Freq: Four times a day (QID) | ORAL | Status: DC | PRN

## 2015-09-06 MED ORDER — ONDANSETRON HCL 4 MG/2ML IJ SOLN
4.0000 mg | Freq: Four times a day (QID) | INTRAMUSCULAR | Status: DC | PRN
  Administered 2015-09-07 – 2015-09-08 (×6): 4 mg via INTRAVENOUS
  Filled 2015-09-06 (×6): qty 2

## 2015-09-06 MED ORDER — ONDANSETRON HCL 4 MG/2ML IJ SOLN
4.0000 mg | Freq: Once | INTRAMUSCULAR | Status: AC
  Administered 2015-09-06: 4 mg via INTRAVENOUS
  Filled 2015-09-06: qty 2

## 2015-09-06 MED ORDER — SODIUM CHLORIDE 0.9 % IV BOLUS (SEPSIS)
500.0000 mL | Freq: Once | INTRAVENOUS | Status: AC
  Administered 2015-09-06: 500 mL via INTRAVENOUS

## 2015-09-06 MED ORDER — ONDANSETRON HCL 4 MG PO TABS: 4.0000 mg | ORAL_TABLET | Freq: Four times a day (QID) | ORAL | Status: DC | PRN

## 2015-09-06 MED ORDER — SODIUM CHLORIDE 0.9 % IV SOLN
Freq: Once | INTRAVENOUS | Status: AC
  Administered 2015-09-06: 22:00:00 via INTRAVENOUS

## 2015-09-06 MED ORDER — INSULIN ASPART 100 UNIT/ML ~~LOC~~ SOLN
0.0000 [IU] | SUBCUTANEOUS | Status: DC
Start: 2015-09-06 — End: 2015-09-06

## 2015-09-06 MED ORDER — ACETAMINOPHEN 650 MG RE SUPP: 650.0000 mg | Freq: Four times a day (QID) | RECTAL | Status: DC | PRN

## 2015-09-06 MED ORDER — IOPAMIDOL (ISOVUE-300) INJECTION 61%
INTRAVENOUS | Status: AC
  Administered 2015-09-06: 100 mL
  Filled 2015-09-06: qty 100

## 2015-09-06 MED ORDER — INSULIN ASPART 100 UNIT/ML ~~LOC~~ SOLN
0.0000 [IU] | Freq: Four times a day (QID) | SUBCUTANEOUS | Status: DC
  Administered 2015-09-07: 2 [IU] via SUBCUTANEOUS
  Administered 2015-09-07: 1 [IU] via SUBCUTANEOUS
  Administered 2015-09-07 (×2): 2 [IU] via SUBCUTANEOUS
  Administered 2015-09-08 – 2015-09-09 (×6): 1 [IU] via SUBCUTANEOUS

## 2015-09-06 MED ORDER — HYDROMORPHONE HCL 1 MG/ML IJ SOLN
1.0000 mg | Freq: Once | INTRAMUSCULAR | Status: AC
  Administered 2015-09-06: 1 mg via INTRAVENOUS
  Filled 2015-09-06: qty 1

## 2015-09-06 MED ORDER — ALBUTEROL SULFATE (2.5 MG/3ML) 0.083% IN NEBU: 2.5000 mg | INHALATION_SOLUTION | RESPIRATORY_TRACT | Status: DC | PRN

## 2015-09-06 MED ORDER — SODIUM CHLORIDE 0.9% FLUSH
3.0000 mL | Freq: Two times a day (BID) | INTRAVENOUS | Status: DC
  Administered 2015-09-06 – 2015-09-08 (×2): 3 mL via INTRAVENOUS

## 2015-09-06 MED ORDER — MORPHINE SULFATE (PF) 2 MG/ML IV SOLN
2.0000 mg | INTRAVENOUS | Status: DC | PRN
  Administered 2015-09-07 – 2015-09-09 (×7): 2 mg via INTRAVENOUS
  Filled 2015-09-06 (×7): qty 1

## 2015-09-06 MED ORDER — IOPAMIDOL (ISOVUE-370) INJECTION 76%
INTRAVENOUS | Status: AC
  Filled 2015-09-06: qty 100

## 2015-09-06 NOTE — H&P (Addendum)
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 He reports a sharp periumbilical pain that woke him out of his sleep at 4 am this morning. That the pain waxes and wanes in intensity, but constantly feels achy. Symptoms when acutely sharp lasts 15 seconds or so in duration. Associated symptoms included nausea, dry heaves, and episode of vomiting after oral contrast given for CT scan. His last bowel movement which he notes he normal was yesterday. Denies shortness of breath, chest pain, fever, or chills. He previously had a umbilical hernia surgery treated in 2015. Patient also gives history of at least 2 DVTs in the past for which he is currently on Eliquis. Upon admission patient was evaluated and vitals revealed heart rate up to 112, respirations 19, temperature 98.41F, and all other vital signs within normal limits. Initial lab work revealed WBC 17.2, glucose 206, lactic acid 1.27, and all other lab work relatively unremarkable. Urinalysis was unremarkable and a CT scan reveals several small bowel obstructs with the transition zone near an umbilical hernia.    Review of Systems  Constitutional: Negative for chills and weight loss.  HENT: Negative for hearing loss and tinnitus.   Eyes: Negative for photophobia and pain.  Respiratory: Negative for cough, hemoptysis, sputum production and shortness of breath.   Cardiovascular: Positive for leg swelling. Negative for chest pain and palpitations.  Gastrointestinal: Positive for nausea, vomiting and abdominal pain. Negative for constipation.  Genitourinary: Negative for dysuria, urgency and flank pain.  Musculoskeletal: Negative for  joint pain.  Skin: Negative for itching and rash.  Neurological: Negative for tremors, sensory change and loss of consciousness.  Endo/Heme/Allergies: Negative for environmental allergies. Does not bruise/bleed easily.  Psychiatric/Behavioral: Negative for hallucinations and substance abuse.      Past Medical History  Diagnosis Date  . Obesity   . Pulmonary embolism ([place]) 05/04/2012    bilaterally  . Exertional dyspnea 05/04/2012    "isolated episode" (05/05/2012)  . Sleep apnea     "pretty sure; never been tested" (05/05/2012)  . History of chickenpox   . DVT (deep venous thrombosis) ([place])   . Diabetes mellitus, type II ([place]) 02/2013  . Varicose veins      Past Surgical History  Procedure Laterality Date  . No past surgeries    . Laparotomy N/A 12/13/2013    Procedure: EXPLORATORY LAPAROTOMY Repair ventral hernia, without mesh, partial omentectomy;  Surgeon: Gwenyth Ober, MD;  Location: [facility];  Service: General;  Laterality: N/A;      Social History:  reports that he has never smoked. He has never used smokeless tobacco. He reports that he does not drink alcohol or use illicit drugs. Where does patient live--home  and with whom if at home? Can patient participate in ADLs? Yes  No Known Allergies  Family History  Problem Relation Age of Onset  . Diabetes Mother   . Hypertension Mother         Prior to Admission medications   Medication Sig Start Date End Date Taking? Authorizing Provider  apixaban (ELIQUIS) 5 MG TABS tablet Take 1 tablet (5 mg total) by mouth 2 (two) times daily. 06/20/15  Yes Verlee Monte, MD  furosemide (LASIX)  40 MG tablet Take 1 tablet (40 mg total) by mouth daily. 06/23/15  Yes Laurey Morale, MD  metFORMIN (GLUCOPHAGE) 500 MG tablet TAKE 1 TABLET (500 MG TOTAL) BY MOUTH 2 (TWO) TIMES DAILY WITH A MEAL. 04/10/15  Yes Doe-Hyun Kyra Searles, DO  Multiple Vitamin (MULTIVITAMIN WITH MINERALS) TABS tablet Take 1 tablet by mouth daily.    Yes Historical  Provider, MD  OVER THE COUNTER MEDICATION Apply 1 application topically daily. Medication: Back Balm. Patient states he apply to his legs every day.   Yes Historical Provider, MD  silver sulfADIAZINE (SILVADENE) 1 % cream Apply topically 2 (two) times daily. Patient not taking: Reported on 09/06/2015 06/20/15   Verlee Monte, MD     Physical Exam: Filed Vitals:   09/06/15 1800 09/06/15 1830 09/06/15 1918 09/06/15 2045  BP: 117/79 133/93 116/89 130/77  Pulse: 95 89 94 97  Temp:      TempSrc:      Resp: 17 14 21 19   SpO2: 93% 98% 96% 94%     Constitutional: Vital signs reviewed. Patient is a obese male in no acute distress and cooperative with exam. Alert and oriented x3.  Head: Normocephalic and atraumatic  Ear: TM normal bilaterally  Mouth: no erythema or exudates, MMM  Eyes: PERRL, EOMI, conjunctivae normal, No scleral icterus.  Neck: Supple, Trachea midline normal ROM, No JVD, mass, thyromegaly, or carotid bruit present.  Cardiovascular: RRR, S1 normal, S2 normal, no MRG, pulses symmetric and intact bilaterally  Pulmonary/Chest: CTAB, no wheezes, rales, or rhonchi  Abdominal: Soft.  Mild tenderness to palpation midline superior to the umbilicus. non-distended, bowel sounds are normal, no masses, organomegaly, or guarding present. Healed surgical scar present inferior to the umbilicus GU: no CVA tenderness Musculoskeletal: No joint deformities, erythema, or stiffness, ROM full and no nontender Ext: 1+ pitting edema of the bilateral lower extremities. Patient has compression stockings on. No cyanosis, pulses palpable bilaterally (DP and PT)  Hematology: no cervical, inginal, or axillary adenopathy.  Neurological: A&O x3, Strenght is normal and symmetric bilaterally, cranial nerve II-XII are grossly intact, no focal motor deficit, sensory intact to light touch bilaterally.  Skin: Warm, dry and intact. No rash, cyanosis, or clubbing.  Psychiatric: Normal mood and affect. speech and  behavior is normal. Judgment and thought content normal. Cognition and memory are normal.      Data Review   Micro Results No results found for this or any previous visit (from the past 240 hour(s)).  Radiology Reports Ct Abdomen Pelvis W Contrast  09/06/2015  CLINICAL DATA:  Intermittent periumbilical sharp pain beginning this morning. EXAM: CT ABDOMEN AND PELVIS WITH CONTRAST TECHNIQUE: Multidetector CT imaging of the abdomen and pelvis was performed using the standard protocol following bolus administration of intravenous contrast. CONTRAST:  166mL ISOVUE-300 IOPAMIDOL (ISOVUE-300) INJECTION 61% COMPARISON:  05/30/2015 FINDINGS: Lower chest: Lung bases are clear. No effusions. Heart is normal size. Hepatobiliary: Diffuse fatty infiltration of the liver without focal hepatic abnormality. Gallbladder grossly unremarkable. Pancreas: No focal abnormality.  Mild fatty replacement. Spleen: No focal abnormality. Adrenals/Urinary Tract: Small nonobstructing stones in the midpole and lower pole of the right kidney. Small cyst posteriorly off the midpole of the right kidney is stable. No hydronephrosis. Adrenal glands and bladder unremarkable. Stomach/Bowel: Mildly dilated small bowel loops with decompressed distal small bowel loops compatible with partial small bowel obstruction. The transition point is at the umbilicus where there is an umbilical hernia containing a loop of small bowel. Stomach is mildly distended.  Colon that is large bowel grossly unremarkable. Vascular/Lymphatic: No evidence of aneurysm or adenopathy. Reproductive: No visible abnormality. Other: No free fluid or free air. Musculoskeletal: No acute bony abnormality or focal bone lesion. IMPRESSION: Recurrent small bowel obstructs that recurrent partial small bowel obstruction. Small bowel loop is within an umbilical hernia which appears to be the transition zone. Fatty liver. Right nephrolithiasis. Electronically Signed   By: Rolm Baptise  M.D.   On: 09/06/2015 19:05     CBC  Recent Labs Lab 09/06/15 1340  WBC 17.2*  HGB 14.4  HCT 44.5  PLT 245  MCV 86.6  MCH 28.0  MCHC 32.4  RDW 15.2    Chemistries   Recent Labs Lab 09/06/15 1340  NA 140  K 4.6  CL 106  CO2 23  GLUCOSE 206*  BUN 16  CREATININE 0.89  CALCIUM 9.4  AST 21  ALT 25  ALKPHOS 73  BILITOT 1.0   ------------------------------------------------------------------------------------------------------------------ CrCl cannot be calculated (Unknown ideal weight.). ------------------------------------------------------------------------------------------------------------------ No results for input(s): HGBA1C in the last 72 hours. ------------------------------------------------------------------------------------------------------------------ No results for input(s): CHOL, HDL, LDLCALC, TRIG, CHOLHDL, LDLDIRECT in the last 72 hours. ------------------------------------------------------------------------------------------------------------------ No results for input(s): TSH, T4TOTAL, T3FREE, THYROIDAB in the last 72 hours.  Invalid input(s): FREET3 ------------------------------------------------------------------------------------------------------------------ No results for input(s): VITAMINB12, FOLATE, FERRITIN, TIBC, IRON, RETICCTPCT in the last 72 hours.  Coagulation profile No results for input(s): INR, PROTIME in the last 168 hours.  No results for input(s): DDIMER in the last 72 hours.  Cardiac Enzymes No results for input(s): CKMB, TROPONINI, MYOGLOBIN in the last 168 hours.  Invalid input(s): CK ------------------------------------------------------------------------------------------------------------------ Invalid input(s): POCBNP   CBG: No results for input(s): GLUCAP in the last 168 hours.     EKG: Independently reviewed. Sinus tachycardia(108 bpm) with early R-wave transition  Assessment/Plan Small bowel  obstruction: Acute.  Patient with acute onset of abdominal pain is periumbilical. Previous history of abdominal hernia surgery. Tenderness to palpation on physical exam. CT scan showing multiple small bowel obstructs with a transition point near an umbilical hernia. - Admit to a telemetry bed  - Patient to remain NPO - IV fluids normal saline at 75 mL per hour - IV morphine as needed for pain - Will place a nasogastric tube if needed - Gen. surgery consulted   Nausea and Vomiting - Zofran prn   Leukocytosis: WBC elevated at 17.2 on admission. - Continue to monitor for now - standing orders to check blood cultures, if patient spikes temperature greater than 100.63F  - Holding off starting antibiotics at this time unless patient spikes fever  Diabetes mellitus type 2 with hyperglycemia: Blood glucose initially 206 on admission. - Held metformin  - CBGs every 6 hours as sensitive sliding scale insulin  History of DVT/PE: Patient is report of at least 2 separate DVT events which it appears he needs to be on lifelong anticoagulation. - Eliquis currently on hold until able to evaluate possible need of surgery - Restart Eliquis unable  Peripheral edema - Lasix currently on hold, can start IV if needed  DVT prophylaxis of SCDs and compression stockings for now   Code Status:   full Family Communication: bedside Disposition Plan: admit   Total time spent 55 minutes.Greater than 50% of this time was spent in counseling, explanation of diagnosis, planning of further management, and coordination of care  [facility] Pager [phone]  If 7PM-7AM, please contact night-coverage [email or web address] Password [facility] 09/06/2015, 9:03 PM

## 2015-09-06 NOTE — Consult Note (Signed)
Reason for Consult:Moderate SBO and chronic incarcerated hernia Referring Physician: [name] is an 55 y.o. male.  HPI: Patient underwent emergency umbilical hernia repair without mexh in 2015, had recurrence noted in January 2017, previous history of partial sbo and now has partial SBO per Ct and X-ray.  Patient on Eliquis for DVT.  Past Medical History  Diagnosis Date  . Obesity   . Pulmonary embolism ([place]) 05/04/2012    bilaterally  . Exertional dyspnea 05/04/2012    "isolated episode" (05/05/2012)  . Sleep apnea     "pretty sure; never been tested" (05/05/2012)  . History of chickenpox   . DVT (deep venous thrombosis) ([place])   . Diabetes mellitus, type II ([place]) 02/2013  . Varicose veins     Past Surgical History  Procedure Laterality Date  . No past surgeries    . Laparotomy N/A 12/13/2013    Procedure: EXPLORATORY LAPAROTOMY Repair ventral hernia, without mesh, partial omentectomy;  Surgeon: Gwenyth Ober, MD;  Location: [facility] OR;  Service: General;  Laterality: N/A;    Family History  Problem Relation Age of Onset  . Diabetes Mother   . Hypertension Mother     Social History:  reports that he has never smoked. He has never used smokeless tobacco. He reports that he does not drink alcohol or use illicit drugs.  Allergies: No Known Allergies  Medications: I have reviewed the patient's current medications.  Results for orders placed or performed during the hospital encounter of 09/06/15 (from the past 48 hour(s))  Lipase, blood     Status: None   Collection Time: 09/06/15  1:40 PM  Result Value Ref Range   Lipase 21 11 - 51 U/L  Comprehensive metabolic panel     Status: Abnormal   Collection Time: 09/06/15  1:40 PM  Result Value Ref Range   Sodium 140 135 - 145 mmol/L   Potassium 4.6 3.5 - 5.1 mmol/L   Chloride 106 101 - 111 mmol/L   CO2 23 22 - 32 mmol/L   Glucose, Bld 206 (H) 65 - 99 mg/dL   BUN 16 6 - 20 mg/dL   Creatinine, Ser 0.89 0.61 - 1.24 mg/dL     Calcium 9.4 8.9 - 10.3 mg/dL   Total Protein 8.4 (H) 6.5 - 8.1 g/dL   Albumin 3.4 (L) 3.5 - 5.0 g/dL   AST 21 15 - 41 U/L   ALT 25 17 - 63 U/L   Alkaline Phosphatase 73 38 - 126 U/L   Total Bilirubin 1.0 0.3 - 1.2 mg/dL   GFR calc non Af Amer >60 >60 mL/min   GFR calc Af Amer >60 >60 mL/min    Comment: (NOTE) The eGFR has been calculated using the CKD EPI equation. This calculation has not been validated in all clinical situations. eGFR's persistently <60 mL/min signify possible Chronic Kidney Disease.    Anion gap 11 5 - 15  CBC     Status: Abnormal   Collection Time: 09/06/15  1:40 PM  Result Value Ref Range   WBC 17.2 (H) 4.0 - 10.5 K/uL   RBC 5.14 4.22 - 5.81 MIL/uL   Hemoglobin 14.4 13.0 - 17.0 g/dL   HCT 44.5 39.0 - 52.0 %   MCV 86.6 78.0 - 100.0 fL   MCH 28.0 26.0 - 34.0 pg   MCHC 32.4 30.0 - 36.0 g/dL   RDW 15.2 11.5 - 15.5 %   Platelets 245 150 - 400 K/uL  I-Stat CG4 Lactic Acid,  ED     Status: None   Collection Time: 09/06/15  5:05 PM  Result Value Ref Range   Lactic Acid, Venous 1.27 0.5 - 2.0 mmol/L  Urinalysis, Routine w reflex microscopic (not at [facility])     Status: Abnormal   Collection Time: 09/06/15  5:20 PM  Result Value Ref Range   Color, Urine AMBER (A) YELLOW    Comment: BIOCHEMICALS MAY BE AFFECTED BY COLOR   APPearance CLEAR CLEAR   Specific Gravity, Urine 1.022 1.005 - 1.030   pH 5.0 5.0 - 8.0   Glucose, UA NEGATIVE NEGATIVE mg/dL   Hgb urine dipstick MODERATE (A) NEGATIVE   Bilirubin Urine SMALL (A) NEGATIVE   Ketones, ur NEGATIVE NEGATIVE mg/dL   Protein, ur NEGATIVE NEGATIVE mg/dL   Nitrite NEGATIVE NEGATIVE   Leukocytes, UA NEGATIVE NEGATIVE  Urine microscopic-add on     Status: Abnormal   Collection Time: 09/06/15  5:20 PM  Result Value Ref Range   Squamous Epithelial / LPF 0-5 (A) NONE SEEN   WBC, UA 0-5 0 - 5 WBC/hpf   RBC / HPF TOO NUMEROUS TO COUNT 0 - 5 RBC/hpf   Bacteria, UA RARE (A) NONE SEEN   Casts HYALINE CASTS (A) NEGATIVE     Comment: GRANULAR CAST   Crystals CA OXALATE CRYSTALS (A) NEGATIVE   Urine-Other MUCOUS PRESENT      Review of Systems  Constitutional: Negative for fever and chills.  Gastrointestinal: Positive for nausea and vomiting.  Genitourinary: Negative for dysuria.  All other systems reviewed and are negative.  Blood pressure 118/72, pulse 96, temperature 98.4 F (36.9 C), temperature source Oral, resp. rate 19, SpO2 94 %. Physical Exam  Vitals reviewed. Constitutional: He is oriented to person, place, and time.  Morbidly obese  HENT:  Head: Normocephalic and atraumatic.  Eyes: Pupils are equal, round, and reactive to light.  Neck: Normal range of motion. Neck supple.  Cardiovascular: Normal rate, regular rhythm and normal heart sounds.   Respiratory: Effort normal and breath sounds normal.  GI: Soft. Bowel sounds are decreased. There is no tenderness. There is no rigidity, no rebound and no guarding. A hernia is present. Hernia confirmed positive in the ventral area.    Musculoskeletal: Normal range of motion.  Neurological: He is alert and oriented to person, place, and time. He has normal reflexes.  Skin: Skin is warm and dry.    Assessment/Plan: Partial SBO without sepsis or peritonitis Chronically incarcerated recurrent ventral hernia DVT on Eliquis No need for emergency surgery. WBC elevation from vomiting and likely volume contraction Hopefully will not need emergency surgery   Evan Leonard 09/06/2015, 9:26 PM

## 2015-09-06 NOTE — ED Notes (Signed)
Pt reports onset last night of mid abd pains, intermittent sharp pains mid and right side abd and radiates into lower abd. Having nausea, denies vomiting or diarrhea. Last bm was yesterday. Hx of hernia repair two years ago.

## 2015-09-06 NOTE — ED Provider Notes (Signed)
CSN: [id number]     Arrival date & time 09/06/15  1322 History   First MD Initiated Contact with Patient 09/06/15 1646     Chief Complaint  Patient presents with  . Abdominal Pain     (Consider location/radiation/quality/duration/timing/severity/associated sxs/prior Treatment) Patient is a 55 y.o. male presenting with abdominal pain. The history is provided by the patient.  Abdominal Pain Pain location:  Suprapubic and periumbilical Pain quality: aching and gnawing   Pain radiates to:  Does not radiate Pain severity:  Moderate Onset quality:  Gradual Duration:  1 day Timing:  Constant Progression:  Worsening Chronicity:  New Context: not sick contacts   Relieved by:  Nothing Worsened by:  Movement and palpation Ineffective treatments:  None tried Associated symptoms: constipation, fatigue, nausea and vomiting   Associated symptoms: no chest pain, no chills, no cough, no dysuria, no fever and no shortness of breath     Past Medical History  Diagnosis Date  . Obesity   . Pulmonary embolism ([place]) 05/04/2012    bilaterally  . Exertional dyspnea 05/04/2012    "isolated episode" (05/05/2012)  . History of chickenpox   . DVT (deep venous thrombosis) ([place]) 05/04/2012    LLE  . Varicose veins   . Small bowel obstruction ([place]) 09/06/2015  . OSA on CPAP   . Diabetes mellitus, type II ([place]) 02/2013   Past Surgical History  Procedure Laterality Date  . Laparotomy N/A 12/13/2013    Procedure: EXPLORATORY LAPAROTOMY Repair ventral hernia, without mesh, partial omentectomy;  Surgeon: Gwenyth Ober, MD;  Location: [place];  Service: General;  Laterality: N/A;  . Hernia repair     Family History  Problem Relation Age of Onset  . Diabetes Mother   . Hypertension Mother    Social History  Substance Use Topics  . Smoking status: Never Smoker   . Smokeless tobacco: Never Used  . Alcohol Use: No    Review of Systems  Constitutional: Positive for fatigue. Negative for fever and  chills.  HENT: Negative for congestion and rhinorrhea.   Eyes: Negative for visual disturbance.  Respiratory: Negative for cough and shortness of breath.   Cardiovascular: Negative for chest pain.  Gastrointestinal: Positive for nausea, vomiting, abdominal pain and constipation.  Genitourinary: Negative for dysuria and flank pain.  Musculoskeletal: Negative for neck pain.  Skin: Negative for rash.  Allergic/Immunologic: Negative for immunocompromised state.  Neurological: Negative for seizures, syncope and weakness.      Allergies  Review of patient's allergies indicates no known allergies.  Home Medications   Prior to Admission medications   Medication Sig Start Date End Date Taking? Authorizing Provider  apixaban (ELIQUIS) 5 MG TABS tablet Take 1 tablet (5 mg total) by mouth 2 (two) times daily. 06/20/15  Yes Verlee Monte, MD  furosemide (LASIX) 40 MG tablet Take 1 tablet (40 mg total) by mouth daily. 06/23/15  Yes Laurey Morale, MD  metFORMIN (GLUCOPHAGE) 500 MG tablet TAKE 1 TABLET (500 MG TOTAL) BY MOUTH 2 (TWO) TIMES DAILY WITH A MEAL. 04/10/15  Yes Doe-Hyun Kyra Searles, DO  Multiple Vitamin (MULTIVITAMIN WITH MINERALS) TABS tablet Take 1 tablet by mouth daily.    Yes Historical Provider, MD  OVER THE COUNTER MEDICATION Apply 1 application topically daily. Medication: Back Balm. Patient states he apply to his legs every day.   Yes Historical Provider, MD  silver sulfADIAZINE (SILVADENE) 1 % cream Apply topically 2 (two) times daily. Patient not taking: Reported on 09/06/2015 06/20/15  Verlee Monte, MD   BP 133/70 mmHg  Pulse 96  Temp(Src) 99.1 F (37.3 C) (Oral)  Resp 18  Ht 6\' 3"  (1.905 m)  Wt 161.163 kg  BMI 44.41 kg/m2  SpO2 96% Physical Exam  Constitutional: He appears well-developed and well-nourished. No distress.  HENT:  Head: Normocephalic.  Mouth/Throat: No oropharyngeal exudate.  Eyes: Pupils are equal, round, and reactive to light.  Neck: Normal range of motion.  Neck supple.  Cardiovascular: Normal rate, normal heart sounds and intact distal pulses.  Exam reveals no friction rub.   No murmur heard. Pulmonary/Chest: Effort normal and breath sounds normal. No respiratory distress. He has no wheezes. He has no rales.  Abdominal: Soft. Bowel sounds are normal. He exhibits no distension. There is tenderness in the periumbilical area, suprapubic area and left lower quadrant. There is no rebound and no guarding.  Musculoskeletal: He exhibits no edema.  Nursing note and vitals reviewed.   ED Course  Procedures (including critical care time) Labs Review Labs Reviewed  COMPREHENSIVE METABOLIC PANEL - Abnormal; Notable for the following:    Glucose, Bld 206 (*)    Total Protein 8.4 (*)    Albumin 3.4 (*)    All other components within normal limits  CBC - Abnormal; Notable for the following:    WBC 17.2 (*)    All other components within normal limits  URINALYSIS, ROUTINE W REFLEX MICROSCOPIC (NOT AT [facility]) - Abnormal; Notable for the following:    Color, Urine AMBER (*)    Hgb urine dipstick MODERATE (*)    Bilirubin Urine SMALL (*)    All other components within normal limits  URINE MICROSCOPIC-ADD ON - Abnormal; Notable for the following:    Squamous Epithelial / LPF 0-5 (*)    Bacteria, UA RARE (*)    Casts HYALINE CASTS (*)    Crystals CA OXALATE CRYSTALS (*)    All other components within normal limits  GLUCOSE, CAPILLARY - Abnormal; Notable for the following:    Glucose-Capillary 150 (*)    All other components within normal limits  GLUCOSE, CAPILLARY - Abnormal; Notable for the following:    Glucose-Capillary 170 (*)    All other components within normal limits  LIPASE, BLOOD  COMPREHENSIVE METABOLIC PANEL  CBC  I-STAT CG4 LACTIC ACID, ED  I-STAT CG4 LACTIC ACID, ED    Imaging Review Ct Abdomen Pelvis W Contrast  09/06/2015  CLINICAL DATA:  Intermittent periumbilical sharp pain beginning this morning. EXAM: CT ABDOMEN AND PELVIS  WITH CONTRAST TECHNIQUE: Multidetector CT imaging of the abdomen and pelvis was performed using the standard protocol following bolus administration of intravenous contrast. CONTRAST:  124mL ISOVUE-300 IOPAMIDOL (ISOVUE-300) INJECTION 61% COMPARISON:  05/30/2015 FINDINGS: Lower chest: Lung bases are clear. No effusions. Heart is normal size. Hepatobiliary: Diffuse fatty infiltration of the liver without focal hepatic abnormality. Gallbladder grossly unremarkable. Pancreas: No focal abnormality.  Mild fatty replacement. Spleen: No focal abnormality. Adrenals/Urinary Tract: Small nonobstructing stones in the midpole and lower pole of the right kidney. Small cyst posteriorly off the midpole of the right kidney is stable. No hydronephrosis. Adrenal glands and bladder unremarkable. Stomach/Bowel: Mildly dilated small bowel loops with decompressed distal small bowel loops compatible with partial small bowel obstruction. The transition point is at the umbilicus where there is an umbilical hernia containing a loop of small bowel. Stomach is mildly distended. Colon  that is large bowel grossly unremarkable. Vascular/Lymphatic: No evidence of aneurysm or adenopathy. Reproductive: No visible abnormality. Other: No  free fluid or free air. Musculoskeletal: No acute bony abnormality or focal bone lesion. IMPRESSION: Recurrent small bowel obstructs that recurrent partial small bowel obstruction. Small bowel loop is within an umbilical hernia which appears to be the transition zone. Fatty liver. Right nephrolithiasis. Electronically Signed   By: Rolm Baptise M.D.   On: 09/06/2015 19:05   I have personally reviewed and evaluated these images and lab results as part of my medical decision-making.   EKG Interpretation None      MDM   55 year old male with past medical history of morbid obesity diabetes and history of umbilical hernia repair 2 years ago who presents with diffuse abdominal pain nausea and constipation on  arrival the patient is mildly tachycardic but otherwise human dynamically stable. Examination shows diffuse abdominal tenderness and mild distention, although it is limited by body habitus. Labs and imaging showed leukocytosis of 17,000. CMP shows hyperglycemia but normal anion gap and normal renal function. Urinalysis is without signs of UTI. Lactic acid is normal. CT abdomen and pelvis was obtained and concerning for partial small bowel obstruction with transition point at the previous hernia repair. Discussed case with patient's surgeon, Dr. Hulen Skains, who recommended IV fluids and admission for monitoring. Discussed with Hospitalist, who will admit. No signs of perforation or complication at this time. He is otherwise afebrile without evidence of sepsis. He is not vomiting currently so we will hold on NG tube.  Clinical Impression: 1. Small bowel obstruction (HCC)   2. Leukocytosis     Disposition: Admit  Condition: Stable  Pt seen in conjunction with Dr. Nickola Major, MD 09/07/15 0215  Sharlett Iles, MD 09/07/15 Vernelle Emerald

## 2015-09-06 NOTE — ED Notes (Signed)
Attempted report 

## 2015-09-07 ENCOUNTER — Encounter (HOSPITAL_COMMUNITY): Payer: Self-pay | Admitting: General Practice

## 2015-09-07 ENCOUNTER — Inpatient Hospital Stay (HOSPITAL_COMMUNITY): Payer: PRIVATE HEALTH INSURANCE

## 2015-09-07 DIAGNOSIS — K5669 Other intestinal obstruction: Secondary | ICD-10-CM

## 2015-09-07 DIAGNOSIS — R112 Nausea with vomiting, unspecified: Secondary | ICD-10-CM | POA: Diagnosis present

## 2015-09-07 LAB — COMPREHENSIVE METABOLIC PANEL
ALT: 25 U/L (ref 17–63)
AST: 24 U/L (ref 15–41)
Albumin: 3 g/dL — ABNORMAL LOW (ref 3.5–5.0)
Alkaline Phosphatase: 61 U/L (ref 38–126)
Anion gap: 11 (ref 5–15)
BUN: 17 mg/dL (ref 6–20)
CO2: 23 mmol/L (ref 22–32)
Calcium: 8.6 mg/dL — ABNORMAL LOW (ref 8.9–10.3)
Chloride: 107 mmol/L (ref 101–111)
Creatinine, Ser: 0.94 mg/dL (ref 0.61–1.24)
GFR calc Af Amer: >60 mL/min (ref >60)
GFR calc non Af Amer: >60 mL/min (ref >60)
Glucose, Bld: 166 mg/dL — ABNORMAL HIGH (ref 65–99)
Potassium: 4 mmol/L (ref 3.5–5.1)
Sodium: 141 mmol/L (ref 135–145)
Total Bilirubin: 1.1 mg/dL (ref 0.3–1.2)
Total Protein: 7.2 g/dL (ref 6.5–8.1)

## 2015-09-07 LAB — GLUCOSE, CAPILLARY
Glucose-Capillary: 170 mg/dL — ABNORMAL HIGH (ref 65–99)
Glucose-Capillary: 163 mg/dL — ABNORMAL HIGH (ref 65–99)
Glucose-Capillary: 154 mg/dL — ABNORMAL HIGH (ref 65–99)
Glucose-Capillary: 154 mg/dL — ABNORMAL HIGH (ref 65–99)
Glucose-Capillary: 128 mg/dL — ABNORMAL HIGH (ref 65–99)
Glucose-Capillary: 116 mg/dL — ABNORMAL HIGH (ref 65–99)

## 2015-09-07 LAB — CBC
HCT: 42.2 % (ref 39.0–52.0)
Hemoglobin: 13.4 g/dL (ref 13.0–17.0)
MCH: 28.1 pg (ref 26.0–34.0)
MCHC: 31.8 g/dL (ref 30.0–36.0)
MCV: 88.5 fL (ref 78.0–100.0)
Platelets: 229 10*3/uL (ref 150–400)
RBC: 4.77 MIL/uL (ref 4.22–5.81)
RDW: 15.7 % — ABNORMAL HIGH (ref 11.5–15.5)
WBC: 10.1 10*3/uL (ref 4.0–10.5)

## 2015-09-07 LAB — HEPARIN LEVEL (UNFRACTIONATED)
Heparin Unfractionated: 0.88 IU/mL — ABNORMAL HIGH (ref 0.30–0.70)
Heparin Unfractionated: 0.85 IU/mL — ABNORMAL HIGH (ref 0.30–0.70)

## 2015-09-07 LAB — LACTIC ACID, PLASMA: Lactic Acid, Venous: 1.1 mmol/L (ref 0.5–2.0)

## 2015-09-07 LAB — APTT
aPTT: 30 seconds (ref 24–37)
aPTT: 35 seconds (ref 24–37)

## 2015-09-07 MED ORDER — HEPARIN (PORCINE) IN NACL 100-0.45 UNIT/ML-% IJ SOLN
2800.0000 [IU]/h | INTRAMUSCULAR | Status: DC
  Administered 2015-09-07: 1950 [IU]/h via INTRAVENOUS
  Administered 2015-09-07: 2300 [IU]/h via INTRAVENOUS
  Administered 2015-09-08 – 2015-09-09 (×4): 2800 [IU]/h via INTRAVENOUS
  Filled 2015-09-07 (×10): qty 250

## 2015-09-07 MED ORDER — CHLORHEXIDINE GLUCONATE 0.12 % MT SOLN
15.0000 mL | Freq: Two times a day (BID) | OROMUCOSAL | Status: DC
  Administered 2015-09-07 – 2015-09-09 (×5): 15 mL via OROMUCOSAL
  Filled 2015-09-07 (×4): qty 15

## 2015-09-07 MED ORDER — CETYLPYRIDINIUM CHLORIDE 0.05 % MT LIQD
7.0000 mL | Freq: Two times a day (BID) | OROMUCOSAL | Status: DC
  Administered 2015-09-07 – 2015-09-08 (×4): 7 mL via OROMUCOSAL

## 2015-09-07 MED ORDER — KCL IN DEXTROSE-NACL 20-5-0.45 MEQ/L-%-% IV SOLN
INTRAVENOUS | Status: DC
Start: 2015-09-07 — End: 2015-09-10
  Administered 2015-09-07 – 2015-09-09 (×7): via INTRAVENOUS
  Filled 2015-09-07 (×7): qty 1000

## 2015-09-07 MED ORDER — HEPARIN BOLUS VIA INFUSION
3000.0000 [IU] | Freq: Once | INTRAVENOUS | Status: AC
  Administered 2015-09-07: 3000 [IU] via INTRAVENOUS
  Filled 2015-09-07: qty 3000

## 2015-09-07 NOTE — Progress Notes (Signed)
Pt. Stated he will like to wait on the bath for today bc he doesn't feel well. Pt  Will let NT  Know if he changes hes mind

## 2015-09-07 NOTE — Progress Notes (Signed)
Patient ID: Evan Leonard, male   DOB: 02/22/1961, 55 y.o.   MRN: [id number]     [address] [place], [place] [address]    Phone: [phone] FAX: [phone]     Subjective: N/V when he was being moved to xr.  No flatus.   Objective:  Vital signs:  Filed Vitals:   09/06/15 2115 09/06/15 2242 09/07/15 0659 09/07/15 1005  BP: 118/72 133/70 113/67 131/79  Pulse: 96 96 96 99  Temp:  99.1 F (37.3 C) 98.1 F (36.7 C) 98.4 F (36.9 C)  TempSrc:  Oral Oral Oral  Resp: '19 18 18 16  ' Height:  '6\' 3"'  (1.905 m)    Weight:  161.163 kg (355 lb 4.8 oz)    SpO2: 94% 96% 97% 99%    Last BM Date: 09/05/15  Intake/Output   Yesterday:  04/12 0701 - 04/13 0700 In: 3 [I.V.:3] Out: 800 [Urine:750; Emesis/NG output:50] This shift:    I/O last 3 completed shifts: In: 3 [I.V.:3] Out: 800 [Urine:750; Emesis/NG output:50]    Physical Exam: General: Pt awake/alert/oriented x4 in no acute distress Abdomen: Soft.  Nondistended.  Non tender.  No evidence of peritonitis.      Problem List:   Principal Problem:   Small bowel obstruction (HCC) Active Problems:   Pulmonary embolism, hx of   DVT of left distal popliteal vein   Leukocytosis   Diabetes mellitus with hyperglycemia (HCC)   Nausea and vomiting    Results:   Labs: Results for orders placed or performed during the hospital encounter of 09/06/15 (from the past 48 hour(s))  Lipase, blood     Status: None   Collection Time: 09/06/15  1:40 PM  Result Value Ref Range   Lipase 21 11 - 51 U/L  Comprehensive metabolic panel     Status: Abnormal   Collection Time: 09/06/15  1:40 PM  Result Value Ref Range   Sodium 140 135 - 145 mmol/L   Potassium 4.6 3.5 - 5.1 mmol/L   Chloride 106 101 - 111 mmol/L   CO2 23 22 - 32 mmol/L   Glucose, Bld 206 (H) 65 - 99 mg/dL   BUN 16 6 - 20 mg/dL   Creatinine, Ser 0.89 0.61 - 1.24 mg/dL   Calcium 9.4 8.9 - 10.3 mg/dL   Total Protein 8.4 (H) 6.5 - 8.1 g/dL   Albumin 3.4 (L) 3.5 - 5.0 g/dL   AST 21 15 - 41 U/L   ALT 25 17 - 63 U/L   Alkaline Phosphatase 73 38 - 126 U/L   Total Bilirubin 1.0 0.3 - 1.2 mg/dL   GFR calc non Af Amer >60 >60 mL/min   GFR calc Af Amer >60 >60 mL/min    Comment: (NOTE) The eGFR has been calculated using the CKD EPI equation. This calculation has not been validated in all clinical situations. eGFR's persistently <60 mL/min signify possible Chronic Kidney Disease.    Anion gap 11 5 - 15  CBC     Status: Abnormal   Collection Time: 09/06/15  1:40 PM  Result Value Ref Range   WBC 17.2 (H) 4.0 - 10.5 K/uL   RBC 5.14 4.22 - 5.81 MIL/uL   Hemoglobin 14.4 13.0 - 17.0 g/dL   HCT 44.5 39.0 - 52.0 %   MCV 86.6 78.0 - 100.0 fL   MCH 28.0 26.0 - 34.0 pg   MCHC 32.4 30.0 - 36.0 g/dL  RDW 15.2 11.5 - 15.5 %   Platelets 245 150 - 400 K/uL  I-Stat CG4 Lactic Acid, ED     Status: None   Collection Time: 09/06/15  5:05 PM  Result Value Ref Range   Lactic Acid, Venous 1.27 0.5 - 2.0 mmol/L  Urinalysis, Routine w reflex microscopic (not at [facility])     Status: Abnormal   Collection Time: 09/06/15  5:20 PM  Result Value Ref Range   Color, Urine AMBER (A) YELLOW    Comment: BIOCHEMICALS MAY BE AFFECTED BY COLOR   APPearance CLEAR CLEAR   Specific Gravity, Urine 1.022 1.005 - 1.030   pH 5.0 5.0 - 8.0   Glucose, UA NEGATIVE NEGATIVE mg/dL   Hgb urine dipstick MODERATE (A) NEGATIVE   Bilirubin Urine SMALL (A) NEGATIVE   Ketones, ur NEGATIVE NEGATIVE mg/dL   Protein, ur NEGATIVE NEGATIVE mg/dL   Nitrite NEGATIVE NEGATIVE   Leukocytes, UA NEGATIVE NEGATIVE  Urine microscopic-add on     Status: Abnormal   Collection Time: 09/06/15  5:20 PM  Result Value Ref Range   Squamous Epithelial / LPF 0-5 (A) NONE SEEN   WBC, UA 0-5 0 - 5 WBC/hpf   RBC / HPF TOO NUMEROUS TO COUNT 0 - 5 RBC/hpf   Bacteria, UA RARE (A) NONE SEEN   Casts HYALINE CASTS (A) NEGATIVE    Comment: GRANULAR CAST    Crystals CA OXALATE CRYSTALS (A) NEGATIVE   Urine-Other MUCOUS PRESENT   Glucose, capillary     Status: Abnormal   Collection Time: 09/06/15 10:37 PM  Result Value Ref Range   Glucose-Capillary 150 (H) 65 - 99 mg/dL   Comment 1 Notify RN   Glucose, capillary     Status: Abnormal   Collection Time: 09/07/15  1:49 AM  Result Value Ref Range   Glucose-Capillary 170 (H) 65 - 99 mg/dL   Comment 1 Notify RN   Comprehensive metabolic panel     Status: Abnormal   Collection Time: 09/07/15  5:06 AM  Result Value Ref Range   Sodium 141 135 - 145 mmol/L   Potassium 4.0 3.5 - 5.1 mmol/L   Chloride 107 101 - 111 mmol/L   CO2 23 22 - 32 mmol/L   Glucose, Bld 166 (H) 65 - 99 mg/dL   BUN 17 6 - 20 mg/dL   Creatinine, Ser 0.94 0.61 - 1.24 mg/dL   Calcium 8.6 (L) 8.9 - 10.3 mg/dL   Total Protein 7.2 6.5 - 8.1 g/dL   Albumin 3.0 (L) 3.5 - 5.0 g/dL   AST 24 15 - 41 U/L   ALT 25 17 - 63 U/L   Alkaline Phosphatase 61 38 - 126 U/L   Total Bilirubin 1.1 0.3 - 1.2 mg/dL   GFR calc non Af Amer >60 >60 mL/min   GFR calc Af Amer >60 >60 mL/min    Comment: (NOTE) The eGFR has been calculated using the CKD EPI equation. This calculation has not been validated in all clinical situations. eGFR's persistently <60 mL/min signify possible Chronic Kidney Disease.    Anion gap 11 5 - 15  CBC     Status: Abnormal   Collection Time: 09/07/15  5:06 AM  Result Value Ref Range   WBC 10.1 4.0 - 10.5 K/uL   RBC 4.77 4.22 - 5.81 MIL/uL   Hemoglobin 13.4 13.0 - 17.0 g/dL   HCT 42.2 39.0 - 52.0 %   MCV 88.5 78.0 - 100.0 fL   MCH 28.1 26.0 -  34.0 pg   MCHC 31.8 30.0 - 36.0 g/dL   RDW 15.7 (H) 11.5 - 15.5 %   Platelets 229 150 - 400 K/uL  Glucose, capillary     Status: Abnormal   Collection Time: 09/07/15  5:31 AM  Result Value Ref Range   Glucose-Capillary 163 (H) 65 - 99 mg/dL  Glucose, capillary     Status: Abnormal   Collection Time: 09/07/15  7:26 AM  Result Value Ref Range   Glucose-Capillary 154 (H)  65 - 99 mg/dL    Imaging / Studies: Ct Abdomen Pelvis W Contrast  09/06/2015  CLINICAL DATA:  Intermittent periumbilical sharp pain beginning this morning. EXAM: CT ABDOMEN AND PELVIS WITH CONTRAST TECHNIQUE: Multidetector CT imaging of the abdomen and pelvis was performed using the standard protocol following bolus administration of intravenous contrast. CONTRAST:  125m ISOVUE-300 IOPAMIDOL (ISOVUE-300) INJECTION 61% COMPARISON:  05/30/2015 FINDINGS: Lower chest: Lung bases are clear. No effusions. Heart is normal size. Hepatobiliary: Diffuse fatty infiltration of the liver without focal hepatic abnormality. Gallbladder grossly unremarkable. Pancreas: No focal abnormality.  Mild fatty replacement. Spleen: No focal abnormality. Adrenals/Urinary Tract: Small nonobstructing stones in the midpole and lower pole of the right kidney. Small cyst posteriorly off the midpole of the right kidney is stable. No hydronephrosis. Adrenal glands and bladder unremarkable. Stomach/Bowel: Mildly dilated small bowel loops with decompressed distal small bowel loops compatible with partial small bowel obstruction. The transition point is at the umbilicus where there is an umbilical hernia containing a loop of small bowel. Stomach is mildly distended. Colon that is large bowel grossly unremarkable. Vascular/Lymphatic: No evidence of aneurysm or adenopathy. Reproductive: No visible abnormality. Other: No free fluid or free air. Musculoskeletal: No acute bony abnormality or focal bone lesion. IMPRESSION: Recurrent small bowel obstructs that recurrent partial small bowel obstruction. Small bowel loop is within an umbilical hernia which appears to be the transition zone. Fatty liver. Right nephrolithiasis. Electronically Signed   By: KRolm BaptiseM.D.   On: 09/06/2015 19:05   Dg Abd 2 Views  09/07/2015  CLINICAL DATA:  Followup for small bowel obstruction. Patient with nausea and vomiting and midline abdominal pain for 2 days. EXAM:  ABDOMEN - 2 VIEW COMPARISON:  CT, 09/06/2015 FINDINGS: There is no significant bowel dilation, but there are few nonspecific air-fluid levels most evident in the central abdomen. Right intrarenal stones are noted stable from the prior CT. The smaller left lower pole intrarenal stones noted on the CT are less well defined radiographically. No other soft tissue abnormality. Residual contrast is noted in the bladder. IMPRESSION: 1. No significant bowel dilation. This would suggest an improved small bowel obstruction. There are still scattered air-fluid levels on the erect view. No free air. Electronically Signed   By: DLajean ManesM.D.   On: 09/07/2015 09:02    Medications / Allergies:  Scheduled Meds: . insulin aspart  0-9 Units Subcutaneous Q6H  . sodium chloride flush  3 mL Intravenous Q12H   Continuous Infusions: . dextrose 5 % and 0.45 % NaCl with KCl 20 mEq/L    . heparin     PRN Meds:.acetaminophen **OR** acetaminophen, albuterol, morphine injection, ondansetron **OR** ondansetron (ZOFRAN) IV  Antibiotics: Anti-infectives    None        Assessment/Plan SBO Chronic incarcerated recurrent ventral hernia obesity  Will consider giving gastrografin PO.  AXR appears better this AM, but clinically unchanged.  Abdomen is non tender.  Unable to appreciate hernias due to obesity.  Hx DVT/PE-may  start heparin gtt, hold Eliquis(last dose 4/11 PM)  Erby Pian, ANP-BC [facility] Surgery Pager [phone](7A-4:30P) For consults and floor pages call [phone](7A-4:30P)  09/07/2015 10:45 AM

## 2015-09-07 NOTE — Progress Notes (Signed)
Utilization review completed.  

## 2015-09-07 NOTE — Progress Notes (Signed)
ANTICOAGULATION CONSULT NOTE - Initial Consult  Pharmacy Consult for Switch Eliquis to Heparin Indication: Hx of 2 dvts (Eliquis PTA)  No Known Allergies  Patient Measurements: Height: 6\' 3"  (190.5 cm) Weight: (!) 355 lb 4.8 oz (161.163 kg) IBW/kg (Calculated) : 84.5 Heparin Dosing Weight: 122.3 kg  Vital Signs: Temp: 98.1 F (36.7 C) (04/13 0659) Temp Source: Oral (04/13 0659) BP: 113/67 mmHg (04/13 0659) Pulse Rate: 96 (04/13 0659)  Labs:  Recent Labs  09/06/15 1340 09/07/15 0506  HGB 14.4 13.4  HCT 44.5 42.2  PLT 245 229  CREATININE 0.89 0.94    Estimated Creatinine Clearance: 143 mL/min (by C-G formula based on Cr of 0.94).   Medical History: Past Medical History  Diagnosis Date  . Obesity   . Pulmonary embolism ([place]) 05/04/2012    bilaterally  . Exertional dyspnea 05/04/2012    "isolated episode" (05/05/2012)  . History of chickenpox   . DVT (deep venous thrombosis) ([place]) 05/04/2012    LLE  . Varicose veins   . Small bowel obstruction ([place]) 09/06/2015  . OSA on CPAP   . Diabetes mellitus, type II ([place]) 02/2013    Medications:  Scheduled:  . insulin aspart  0-9 Units Subcutaneous Q6H  . sodium chloride flush  3 mL Intravenous Q12H   Infusions:    Assessment: 55 year old male on Eliquis prior to admission for history of 2 DVTs admitted with partial SBO with possibility of surgery to hold Eliquis and switch to IV heparin. Last Eliquis dose was on 4/12 at 0900 (24 hrs ago).   Goal of Therapy:  Heparin level 0.3-0.7 units/ml aPTT 66-102 seconds Monitor platelets by anticoagulation protocol: Yes   Plan:  Baseline aPTT, HL now.  Start heparin at 1950 units/hr.  Recheck aPTT and HL in 6 hours.  Daily aPTT, HL (until correlating) and CBC while on therapy Follow-up ability to resume Eliquis is no surgery planned.   Sloan Leiter, PharmD, BCPS Clinical Pharmacist [phone]  09/07/2015,9:50 AM

## 2015-09-07 NOTE — Progress Notes (Signed)
PROGRESS NOTE                                                                                                                                                                                                             Patient Demographics:    Evan Leonard, is a 55 y.o. male, DOB - 1960-12-28, [id number]  Admit date - 09/06/2015   Admitting Physician Norval Morton, MD  Outpatient Primary MD for the patient is Drema Pry, DO  LOS - 1   Chief Complaint  Patient presents with  . 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   Subjective:    Evan Leonard Has no new complaints reported today. No acute issues overnight reported to me.   Assessment  & Plan :    Principal Problem:   Small bowel obstruction (HCC) Active Problems:   Pulmonary embolism, hx of   DVT of left distal popliteal vein   Leukocytosis   Diabetes mellitus with hyperglycemia (HCC)   Nausea and vomiting   1.We'll continue bowel rest, obtain serial lactic acid levels. Patient has history of pulmonary embolism and DVT as such I do not feel comfortable discontinuing anticoagulation medication. This was discussed with the surgical team. As such will place on heparin drip and the surgical procedure anticipated may discontinue the drip. Leukocytosis most likely secondary to principal problem, of note however leukocytosis resolved. For diabetes we will continue sliding scale insulin and was able to advance diet will advance to diabetic diet. Her nausea and vomiting will treat supportively    Code Status : full  Family Communication  : d/c patient directly  Disposition Plan  : pending hospital course and resolution of SBO  Barriers For Discharge : Resolution of SBO  Consults  :  Gen. surgery  Procedures  : None  DVT  Prophylaxis  :  Place on heparin drip  Lab Results  Component Value Date   PLT 229 09/07/2015    Antibiotics  :  None  Anti-infectives    None        Objective:   Filed Vitals:   09/06/15 2115 09/06/15 2242 09/07/15 0659 09/07/15 1005  BP: 118/72 133/70 113/67  131/79  Pulse: 96 96 96 99  Temp:  99.1 F (37.3 C) 98.1 F (36.7 C) 98.4 F (36.9 C)  TempSrc:  Oral Oral Oral  Resp: 19 18 18 16   Height:  6\' 3"  (D34-534 m)    Weight:  161.163 kg (355 lb 4.8 oz)    SpO2: 94% 96% 97% 99%    Wt Readings from Last 3 Encounters:  09/06/15 161.163 kg (355 lb 4.8 oz)  07/12/15 161.481 kg (356 lb)  06/23/15 160.12 kg (353 lb)     Intake/Output Summary (Last 24 hours) at 09/07/15 1239 Last data filed at 09/07/15 1100  Gross per 24 hour  Intake      3 ml  Output   1300 ml  Net  -1297 ml     Physical Exam  Awake Alert, Oriented X 3, in NAD Supple Neck,No JVD, No cervical lymphadenopathy appreciated.  Symmetrical Chest wall movement, Good air movement bilaterally, CTAB RRR,No Gallops,Rubs or new Murmurs, No Parasternal Heave Abdomen: hypoactive bowel sounds, no guarding Musculoskeletal: No Cyanosis, Clubbing or edema    Data Review:    CBC  Recent Labs Lab 09/06/15 1340 09/07/15 0506  WBC 17.2* 10.1  HGB 14.4 13.4  HCT 44.5 42.2  PLT 245 229  MCV 86.6 88.5  MCH 28.0 28.1  MCHC 32.4 31.8  RDW 15.2 15.7*    Chemistries   Recent Labs Lab 09/06/15 1340 09/07/15 0506  NA 140 141  K 4.6 4.0  CL 106 107  CO2 23 23  GLUCOSE 206* 166*  BUN 16 17  CREATININE 0.89 0.94  CALCIUM 9.4 8.6*  AST 21 24  ALT 25 25  ALKPHOS 73 61  BILITOT 1.0 1.1   ------------------------------------------------------------------------------------------------------------------ No results for input(s): CHOL, HDL, LDLCALC, TRIG, CHOLHDL, LDLDIRECT in the last 72 hours.  Lab Results  Component Value Date   HGBA1C 7.5* 06/16/2015    ------------------------------------------------------------------------------------------------------------------ No results for input(s): TSH, T4TOTAL, T3FREE, THYROIDAB in the last 72 hours.  Invalid input(s): FREET3 ------------------------------------------------------------------------------------------------------------------ No results for input(s): VITAMINB12, FOLATE, FERRITIN, TIBC, IRON, RETICCTPCT in the last 72 hours.  Coagulation profile No results for input(s): INR, PROTIME in the last 168 hours.  No results for input(s): DDIMER in the last 72 hours.  Cardiac Enzymes No results for input(s): CKMB, TROPONINI, MYOGLOBIN in the last 168 hours.  Invalid input(s): CK ------------------------------------------------------------------------------------------------------------------ No results found for: BNP  Inpatient Medications  Scheduled Meds: . antiseptic oral rinse  7 mL Mouth Rinse q12n4p  . chlorhexidine  15 mL Mouth Rinse BID  . insulin aspart  0-9 Units Subcutaneous Q6H  . sodium chloride flush  3 mL Intravenous Q12H   Continuous Infusions: . dextrose 5 % and 0.45 % NaCl with KCl 20 mEq/L 100 mL/hr at 09/07/15 1044  . heparin 1,950 Units/hr (09/07/15 1045)   PRN Meds:.acetaminophen **OR** acetaminophen, albuterol, morphine injection, ondansetron **OR** ondansetron (ZOFRAN) IV  Micro Results No results found for this or any previous visit (from the past 240 hour(s)).  Radiology Reports   Time Spent in minutes  25   Velvet Bathe M.D on 09/07/2015 at 12:39 PM  Between 7am to 7pm - Pager - [phone]  After 7pm go to [email or web address] - password [facility]  [facility] -  Office  [phone]

## 2015-09-08 ENCOUNTER — Inpatient Hospital Stay (HOSPITAL_COMMUNITY): Payer: PRIVATE HEALTH INSURANCE

## 2015-09-08 LAB — HEMOGLOBIN A1C
Hgb A1c MFr Bld: 7.4 % — ABNORMAL HIGH (ref 4.8–5.6)
Mean Plasma Glucose: 166 mg/dL

## 2015-09-08 LAB — GLUCOSE, CAPILLARY
Glucose-Capillary: 116 mg/dL — ABNORMAL HIGH (ref 65–99)
Glucose-Capillary: 126 mg/dL — ABNORMAL HIGH (ref 65–99)
Glucose-Capillary: 132 mg/dL — ABNORMAL HIGH (ref 65–99)
Glucose-Capillary: 141 mg/dL — ABNORMAL HIGH (ref 65–99)
Glucose-Capillary: 145 mg/dL — ABNORMAL HIGH (ref 65–99)
Glucose-Capillary: 156 mg/dL — ABNORMAL HIGH (ref 65–99)

## 2015-09-08 LAB — APTT
aPTT: 44 seconds — ABNORMAL HIGH (ref 24–37)
aPTT: 66 seconds — ABNORMAL HIGH (ref 24–37)
aPTT: 59 seconds — ABNORMAL HIGH (ref 24–37)

## 2015-09-08 LAB — LACTIC ACID, PLASMA: Lactic Acid, Venous: 0.8 mmol/L (ref 0.5–2.0)

## 2015-09-08 LAB — HEPARIN LEVEL (UNFRACTIONATED)
Heparin Unfractionated: 0.8 IU/mL — ABNORMAL HIGH (ref 0.30–0.70)
Heparin Unfractionated: 0.52 IU/mL (ref 0.30–0.70)

## 2015-09-08 NOTE — Progress Notes (Signed)
ANTICOAGULATION CONSULT NOTE - Follow up [place] for Switch Eliquis to Heparin Indication: Hx of 2 dvts (Eliquis PTA)  No Known Allergies  Patient Measurements: Height: 6\' 3"  (190.5 cm) Weight: (!) 355 lb 4.8 oz (161.163 kg) IBW/kg (Calculated) : 84.5 Heparin Dosing Weight: 122.3 kg  Vital Signs: Temp: 98.4 F (36.9 C) (04/14 1300) Temp Source: Oral (04/14 1300) BP: 126/70 mmHg (04/14 1300) Pulse Rate: 85 (04/14 1300)   Assessment: 55 year old male on Eliquis prior to admission for history of 2 DVTs admitted with partial SBO with possibility of surgery to hold Eliquis and switch to IV heparin. Last Eliquis dose was on 4/12 at 0900.  -aPTT= 59 and HL= 0.52 on heparin at 2800 units/hr -with last Eliquis dose > 48hrs ago, anticipate a small or minimal influence on the heparin level  Goal of Therapy:  Heparin level 0.3-0.7 units/ml aPTT 66-102 seconds Monitor platelets by anticoagulation protocol: Yes   Plan:  No heparin changes Heparin level, aPTT and CBC in am If the heparin level is stable will consider using heparin levels only Daily aPTT, HL (until correlating) and CBC while on therapy Follow-up ability to resume Eliquis if no surgery planned  Hildred Laser, Pharm D 09/08/2015 7:46 PM

## 2015-09-08 NOTE — Progress Notes (Signed)
ANTICOAGULATION CONSULT NOTE - Follow up [place] for Switch Eliquis to Heparin Indication: Hx of 2 dvts (Eliquis PTA)  No Known Allergies  Patient Measurements: Height: 6\' 3"  (190.5 cm) Weight: (!) 355 lb 4.8 oz (161.163 kg) IBW/kg (Calculated) : 84.5 Heparin Dosing Weight: 122.3 kg  Vital Signs: Temp: 97.5 F (36.4 C) (04/13 2145) Temp Source: Axillary (04/13 2145) BP: 124/73 mmHg (04/13 2145) Pulse Rate: 99 (04/13 2145)   Assessment: 55 year old male on Eliquis prior to admission for history of 2 DVTs admitted with partial SBO with possibility of surgery to hold Eliquis and switch to IV heparin. Last Eliquis dose was on 4/12 at 0900 (24 hrs ago). Most recent aPTT 44 on hep gtt of 2300 units/hr. No issues with heparin per RN.   Goal of Therapy:  Heparin level 0.3-0.7 units/ml aPTT 66-102 seconds Monitor platelets by anticoagulation protocol: Yes   Plan:  1. Increase heparin to 2700 units/hr 2. HL and aPTT in ~ 6 hours  3. Daily aPTT, HL (until correlating) and CBC while on therapy 4. Follow-up ability to resume Eliquis is no surgery planned  Vincenza Hews, PharmD, BCPS 09/08/2015, 3:15 AM Pager: [phone]

## 2015-09-08 NOTE — Progress Notes (Signed)
  Subjective: Moving bowels   Films unchanged   Objective: Vital signs in last 24 hours: Temp:  [97.5 F (36.4 C)-98.6 F (37 C)] 98.6 F (37 C) (04/14 0439) Pulse Rate:  [91-99] 91 (04/14 0439) Resp:  [16-17] 17 (04/14 0439) BP: (124-141)/(73-85) 132/85 mmHg (04/14 0439) SpO2:  [94 %-96 %] 94 % (04/14 0439) Last BM Date: 09/08/15  Intake/Output from previous day: 04/13 0701 - 04/14 0700 In: 2946.7 [P.O.:120; I.V.:2826.7] Out: 850 [Urine:850] Intake/Output this shift:    Incision/Wound:obese abdomen  NT hernia difficult to feel due to size but seems nontender   Lab Results:   Recent Labs  09/06/15 1340 09/07/15 0506  WBC 17.2* 10.1  HGB 14.4 13.4  HCT 44.5 42.2  PLT 245 229   BMET  Recent Labs  09/06/15 1340 09/07/15 0506  NA 140 141  K 4.6 4.0  CL 106 107  CO2 23 23  GLUCOSE 206* 166*  BUN 16 17  CREATININE 0.89 0.94  CALCIUM 9.4 8.6*   PT/INR No results for input(s): LABPROT, INR in the last 72 hours. ABG No results for input(s): PHART, HCO3 in the last 72 hours.  Invalid input(s): PCO2, PO2  Studies/Results:   Anti-infectives: Anti-infectives    None      Assessment/Plan: Patient Active Problem List   Diagnosis Date Noted  . Nausea and vomiting 09/07/2015  . Small bowel obstruction ([place]) 09/06/2015  . Bilateral leg edema 06/23/2015  . Cellulitis 06/16/2015  . Sepsis ([place]) 06/16/2015  . Cellulitis of right lower extremity 05/17/2015  . Cellulitis of right leg 05/17/2015  . Chronic venous insufficiency 01/25/2015  . Postop check 12/31/2013  . HCAP (healthcare-associated pneumonia) 12/28/2013  . Knee pain, right 12/28/2013  . Umbilical hernia, incarcerated 12/13/2013  . Diabetes mellitus with hyperglycemia ([place]) 03/31/2013  . Leukocytosis 03/23/2013  . Hyponatremia 03/23/2013  . Hematuria 03/23/2013  . Pulmonary embolism, hx of 05/05/2012  . Morbid obesity with body mass index of 40.0-44.9 in adult [facility]) 05/05/2012  .  Obstructive sleep apnea 05/05/2012  . DVT of left distal popliteal vein 05/05/2012    Bowels moving and no more vomiting Try clears and follow for now    LOS: 2 days    [place] Wank A. 09/08/2015

## 2015-09-08 NOTE — Progress Notes (Signed)
ANTICOAGULATION CONSULT NOTE - Follow up [place] for Switch Eliquis to Heparin Indication: Hx of 2 dvts (Eliquis PTA)  No Known Allergies  Patient Measurements: Height: 6\' 3"  (190.5 cm) Weight: (!) 355 lb 4.8 oz (161.163 kg) IBW/kg (Calculated) : 84.5 Heparin Dosing Weight: 122.3 kg  Vital Signs: Temp: 98.6 F (37 C) (04/14 0439) Temp Source: Oral (04/14 0439) BP: 132/85 mmHg (04/14 0439) Pulse Rate: 91 (04/14 0439)   Assessment: 55 year old male on Eliquis prior to admission for history of 2 DVTs admitted with partial SBO with possibility of surgery to hold Eliquis and switch to IV heparin. Last Eliquis dose was on 4/12 at 0900 (24 hrs ago).   Most recent aPTT is 66 on hep gtt of 2700 units/hr (heparin level still not correlating)- therapeutic but at the low end of goal. No issues with heparin per RN. No bleeding reported.   Goal of Therapy:  Heparin level 0.3-0.7 units/ml aPTT 66-102 seconds Monitor platelets by anticoagulation protocol: Yes   Plan:  1. Increase heparin to 2800 units/hr to keep in goal 2. Confirm with HL and aPTT in ~ 6 hours  3. Daily aPTT, HL (until correlating) and CBC while on therapy 4. Follow-up ability to resume Eliquis if no surgery planned  Sloan Leiter, PharmD, BCPS Clinical Pharmacist [phone]  09/08/2015, 11:24 AM

## 2015-09-08 NOTE — Progress Notes (Signed)
PROGRESS NOTE                                                                                                                                                                                                             Patient Demographics:    Evan Leonard, is a 55 y.o. male, DOB - 10-16-1960, [id number]  Admit date - 09/06/2015   Admitting Physician Norval Morton, MD  Outpatient Primary MD for the patient is Drema Pry, DO  LOS - 2   Chief Complaint  Patient presents with  . 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   Subjective:    Evan Leonard Has no new complaints reported today. No acute issues overnight reported to me.   Assessment  & Plan :    Principal Problem:   Small bowel obstruction ([place]) - Patient reports having bowel movements - continue bowel rest, advance diet to clears.  Active Problems:   Pulmonary embolism, hx of   DVT of left distal popliteal vein   Leukocytosis   Diabetes mellitus with hyperglycemia (HCC)    Nausea and vomiting - treat supportively   Code Status : full  Family Communication  : d/c patient directly  Disposition Plan  : pending hospital course and resolution of SBO  Barriers For Discharge : Resolution of SBO  Consults  :  Gen. surgery  Procedures  : None  DVT Prophylaxis  :  Place on heparin drip  Lab Results  Component Value Date   PLT 229 09/07/2015    Antibiotics  :  None  Anti-infectives    None        Objective:   Filed Vitals:   09/07/15 1338 09/07/15 2145 09/08/15 0439 09/08/15 1300  BP: 141/82 124/73 132/85 126/70  Pulse: 92 99 91 85  Temp: 98.2 F (36.8 C) 97.5 F (36.4 C) 98.6 F (37 C) 98.4 F (36.9 C)  TempSrc: Oral Axillary Oral Oral  Resp: 16 16 17 18   Height:      Weight:      SpO2: 96%  96% 94% 96%    Wt Readings from Last 3 Encounters:  09/06/15 161.163 kg (355 lb 4.8 oz)  07/12/15 161.481 kg (356 lb)  06/23/15 160.12 kg (353 lb)     Intake/Output Summary (Last 24 hours) at 09/08/15 1603 Last data filed at 09/08/15 1500  Gross per 24 hour  Intake 2386.71 ml  Output    650 ml  Net 1736.71 ml     Physical Exam  Awake Alert, Oriented X 3, in NAD Supple Neck,No JVD, No cervical lymphadenopathy appreciated.  Symmetrical Chest wall movement, Good air movement bilaterally, CTAB RRR,No Gallops,Rubs or new Murmurs, No Parasternal Heave Abdomen: hypoactive bowel sounds, no guarding Musculoskeletal: No Cyanosis, Clubbing or edema    Data Review:    CBC  Recent Labs Lab 09/06/15 1340 09/07/15 0506  WBC 17.2* 10.1  HGB 14.4 13.4  HCT 44.5 42.2  PLT 245 229  MCV 86.6 88.5  MCH 28.0 28.1  MCHC 32.4 31.8  RDW 15.2 15.7*    Chemistries   Recent Labs Lab 09/06/15 1340 09/07/15 0506  NA 140 141  K 4.6 4.0  CL 106 107  CO2 23 23  GLUCOSE 206* 166*  BUN 16 17  CREATININE 0.89 0.94  CALCIUM 9.4 8.6*  AST 21 24  ALT 25 25  ALKPHOS 73 61  BILITOT 1.0 1.1   ------------------------------------------------------------------------------------------------------------------ No results for input(s): CHOL, HDL, LDLCALC, TRIG, CHOLHDL, LDLDIRECT in the last 72 hours.  Lab Results  Component Value Date   HGBA1C 7.4* 09/07/2015   ------------------------------------------------------------------------------------------------------------------ No results for input(s): TSH, T4TOTAL, T3FREE, THYROIDAB in the last 72 hours.  Invalid input(s): FREET3 ------------------------------------------------------------------------------------------------------------------ No results for input(s): VITAMINB12, FOLATE, FERRITIN, TIBC, IRON, RETICCTPCT in the last 72 hours.  Coagulation profile No results for input(s): INR, PROTIME in the last 168 hours.  No results  for input(s): DDIMER in the last 72 hours.  Cardiac Enzymes No results for input(s): CKMB, TROPONINI, MYOGLOBIN in the last 168 hours.  Invalid input(s): CK ------------------------------------------------------------------------------------------------------------------ No results found for: BNP  Inpatient Medications  Scheduled Meds: . antiseptic oral rinse  7 mL Mouth Rinse q12n4p  . chlorhexidine  15 mL Mouth Rinse BID  . insulin aspart  0-9 Units Subcutaneous Q6H  . sodium chloride flush  3 mL Intravenous Q12H   Continuous Infusions: . dextrose 5 % and 0.45 % NaCl with KCl 20 mEq/L 100 mL/hr at 09/08/15 0705  . heparin 2,800 Units/hr (09/08/15 1133)   PRN Meds:.acetaminophen **OR** acetaminophen, albuterol, morphine injection, ondansetron **OR** ondansetron (ZOFRAN) IV  Micro Results No results found for this or any previous visit (from the past 240 hour(s)).  Radiology Reports   Time Spent in minutes  25   Velvet Bathe M.D on 09/08/2015 at 4:03 PM  Between 7am to 7pm - Pager - [phone]  After 7pm go to [email or web address] - password [facility]  [facility] -  Office  [phone]

## 2015-09-08 NOTE — Progress Notes (Signed)
Heparin increased to 49ml/hr per pharmacy orders

## 2015-09-09 LAB — GLUCOSE, CAPILLARY
Glucose-Capillary: 105 mg/dL — ABNORMAL HIGH (ref 65–99)
Glucose-Capillary: 110 mg/dL — ABNORMAL HIGH (ref 65–99)
Glucose-Capillary: 115 mg/dL — ABNORMAL HIGH (ref 65–99)
Glucose-Capillary: 134 mg/dL — ABNORMAL HIGH (ref 65–99)
Glucose-Capillary: 136 mg/dL — ABNORMAL HIGH (ref 65–99)
Glucose-Capillary: 138 mg/dL — ABNORMAL HIGH (ref 65–99)

## 2015-09-09 LAB — HEPARIN LEVEL (UNFRACTIONATED): Heparin Unfractionated: 0.4 IU/mL (ref 0.30–0.70)

## 2015-09-09 MED ORDER — APIXABAN 5 MG PO TABS
5.0000 mg | ORAL_TABLET | Freq: Two times a day (BID) | ORAL | Status: DC
  Administered 2015-09-10: 5 mg via ORAL
  Filled 2015-09-09 (×2): qty 1

## 2015-09-09 NOTE — Progress Notes (Addendum)
[place] for Switch Eliquis to Heparin Indication: Hx of 2 dvts (Eliquis PTA)  No Known Allergies  Patient Measurements: Height: 6\' 3"  (190.5 cm) Weight: (!) 355 lb 4.8 oz (161.163 kg) IBW/kg (Calculated) : 84.5 Heparin Dosing Weight: 122.3 kg  Vital Signs: Temp: 97.9 F (36.6 C) (04/15 0454) Temp Source: Oral (04/15 0454) BP: 111/65 mmHg (04/15 0454) Pulse Rate: 81 (04/15 0454)   Assessment: 55 year old male on Eliquis prior to admission for history of 2 DVTs admitted with partial SBO with possibility of surgery to hold Eliquis and switch to IV heparin. Last Eliquis dose was on 4/12 at 0900. Baseline aPTT 30, HL 0.88. CBC wnl. No bleeding reported. Now HL and aPTT correlating. Last HL was therapeutic at 0.4.  Goal of Therapy:  HL 0.3-0.7 units/mL Monitor platelets by anticoagulation protocol: Yes   Plan:  Continue heparin gtt at 2,800 units/hr Monitor daily HL, CBC, s/s of bleed  Follow-up resuming Eliquis since most likely will not need surgery   [name], PharmD, BCPS Clinical Pharmacist Pager [phone] 09/09/2015 4:21 PM    Addendum -Stop heparin at 1000 tomorrow -Start Eliquis 5 mg po bid -Monitor s/sx bleeding -Stop heparin and administer Eliquis at the same time  Harvel Quale 09/09/2015 4:22 PM

## 2015-09-09 NOTE — Progress Notes (Signed)
ANTICOAGULATION CONSULT NOTE - Follow up [place] for Switch Eliquis to Heparin Indication: Hx of 2 dvts (Eliquis PTA)  No Known Allergies  Patient Measurements: Height: 6\' 3"  (190.5 cm) Weight: (!) 355 lb 4.8 oz (161.163 kg) IBW/kg (Calculated) : 84.5 Heparin Dosing Weight: 122.3 kg  Vital Signs: Temp: 97.9 F (36.6 C) (04/15 0454) Temp Source: Oral (04/15 0454) BP: 111/65 mmHg (04/15 0454) Pulse Rate: 81 (04/15 0454)   Assessment: 55 year old male on Eliquis prior to admission for history of 2 DVTs admitted with partial SBO with possibility of surgery to hold Eliquis and switch to IV heparin. Last Eliquis dose was on 4/12 at 0900. Baseline aPTT 30, HL 0.88. CBC wnl. No bleeding reported. Now HL and aPTT correlating. Last HL was therapeutic at 0.4.  Goal of Therapy:  HL 0.3-0.7 units/mL Monitor platelets by anticoagulation protocol: Yes   Plan:  Continue heparin gtt at 2,800 units/hr Monitor daily HL, CBC, s/s of bleed  Follow-up resuming Eliquis since most likely will not need surgery   [name], PharmD, BCPS Clinical Pharmacist Pager [phone] 09/09/2015 9:04 AM

## 2015-09-09 NOTE — Progress Notes (Signed)
PROGRESS NOTE                                                                                                                                                                                                             Patient Demographics:    Evan Leonard, is a 55 y.o. male, DOB - [birth year], [id number]  Admit date - 09/06/2015   Admitting Physician Norval Morton, MD  Outpatient Primary MD for the patient is Drema Pry, DO  LOS - 3   Chief Complaint  Patient presents with  . 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   Subjective:    Evan Leonard Has no new complaints reported today. No acute issues overnight reported to me.   Assessment  & Plan :    Principal Problem:   Small bowel obstruction ([place]) - Patient reports having bowel movements - advancing diet. If tolerates plan is for d/c most likely next am.  Active Problems:   Pulmonary embolism, hx of   DVT of left distal popliteal vein   Leukocytosis   Diabetes mellitus with hyperglycemia (HCC)    Nausea and vomiting - resolving, continue supportive therapy if recurs.   Code Status : full  Family Communication  : d/c patient directly  Disposition Plan  : pending hospital course and resolution of SBO  Barriers For Discharge : Resolution of SBO  Consults  :  Gen. surgery  Procedures  : None  DVT Prophylaxis  :  Place on heparin drip  Lab Results  Component Value Date   PLT 229 09/07/2015    Antibiotics  :  None  Anti-infectives    None        Objective:   Filed Vitals:   09/08/15 0439 09/08/15 1300 09/08/15 2151 09/09/15 0454  BP: 132/85 126/70 117/54 111/65  Pulse: 91 85 93 81  Temp: 98.6 F (37 C) 98.4 F (36.9 C) 99 F (37.2 C) 97.9 F (36.6 C)  TempSrc: Oral Oral Oral Oral  Resp: 17 18 18  18   Height:      Weight:      SpO2: 94% 96% 96% 97%    Wt Readings from Last 3  Encounters:  09/06/15 161.163 kg (355 lb 4.8 oz)  07/12/15 161.481 kg (356 lb)  06/23/15 160.12 kg (353 lb)     Intake/Output Summary (Last 24 hours) at 09/09/15 1602 Last data filed at 09/09/15 1318  Gross per 24 hour  Intake 5020.92 ml  Output    425 ml  Net 4595.92 ml     Physical Exam  Awake Alert, Oriented X 3, in NAD Supple Neck,No JVD, No cervical lymphadenopathy appreciated.  Symmetrical Chest wall movement, Good air movement bilaterally, CTAB RRR,No Gallops,Rubs or new Murmurs, No Parasternal Heave Abdomen: hypoactive bowel sounds, no guarding Musculoskeletal: No Cyanosis, Clubbing or edema    Data Review:    CBC  Recent Labs Lab 09/06/15 1340 09/07/15 0506  WBC 17.2* 10.1  HGB 14.4 13.4  HCT 44.5 42.2  PLT 245 229  MCV 86.6 88.5  MCH 28.0 28.1  MCHC 32.4 31.8  RDW 15.2 15.7*    Chemistries   Recent Labs Lab 09/06/15 1340 09/07/15 0506  NA 140 141  K 4.6 4.0  CL 106 107  CO2 23 23  GLUCOSE 206* 166*  BUN 16 17  CREATININE 0.89 0.94  CALCIUM 9.4 8.6*  AST 21 24  ALT 25 25  ALKPHOS 73 61  BILITOT 1.0 1.1   ------------------------------------------------------------------------------------------------------------------ No results for input(s): CHOL, HDL, LDLCALC, TRIG, CHOLHDL, LDLDIRECT in the last 72 hours.  Lab Results  Component Value Date   HGBA1C 7.4* 09/07/2015   ------------------------------------------------------------------------------------------------------------------ No results for input(s): TSH, T4TOTAL, T3FREE, THYROIDAB in the last 72 hours.  Invalid input(s): FREET3 ------------------------------------------------------------------------------------------------------------------ No results for input(s): VITAMINB12, FOLATE, FERRITIN, TIBC, IRON, RETICCTPCT in the last 72 hours.  Coagulation profile No results for input(s): INR,  PROTIME in the last 168 hours.  No results for input(s): DDIMER in the last 72 hours.  Cardiac Enzymes No results for input(s): CKMB, TROPONINI, MYOGLOBIN in the last 168 hours.  Invalid input(s): CK ------------------------------------------------------------------------------------------------------------------ No results found for: BNP  Inpatient Medications  Scheduled Meds: . antiseptic oral rinse  7 mL Mouth Rinse q12n4p  . chlorhexidine  15 mL Mouth Rinse BID  . insulin aspart  0-9 Units Subcutaneous Q6H  . sodium chloride flush  3 mL Intravenous Q12H   Continuous Infusions: . dextrose 5 % and 0.45 % NaCl with KCl 20 mEq/L 100 mL/hr at 09/09/15 1317  . heparin 2,800 Units/hr (09/09/15 1033)   PRN Meds:.acetaminophen **OR** acetaminophen, albuterol, morphine injection, ondansetron **OR** ondansetron (ZOFRAN) IV  Micro Results No results found for this or any previous visit (from the past 240 hour(s)).  Radiology Reports   Time Spent in minutes  25   Velvet Bathe M.D on 09/09/2015 at 4:02 PM  Between 7am to 7pm - Pager - [phone]  After 7pm go to [email or web address] - password [facility]  [facility] -  Office  [phone]

## 2015-09-09 NOTE — Progress Notes (Signed)
Subjective: Clinically continues to improve. Tolerating liquid diet without nausea or vomiting or pain.  Brief nausea yesterday. Has had several bowel movements.   Objective: Vital signs in last 24 hours: Temp:  [97.9 F (36.6 C)-99 F (37.2 C)] 97.9 F (36.6 C) (04/15 0454) Pulse Rate:  [81-93] 81 (04/15 0454) Resp:  [18] 18 (04/15 0454) BP: (111-126)/(54-70) 111/65 mmHg (04/15 0454) SpO2:  [96 %-97 %] 97 % (04/15 0454) Last BM Date: 09/15/2015  Intake/Output from previous day: Sep 15, 2022 0701 - 04/15 0700 In: 4028.9 [P.O.:960; I.V.:3068.9] Out: 1075 [Urine:1075] Intake/Output this shift:    General appearance: Pleasant gentleman in no distress.  Ambulating independently without discomfort.  Morbidly obese. GI: Obese.  Soft and nondistended.  20 cm transverse incision below umbilicus.  Nontender.  I really don't feel a periumbilical mass.  Skin healthy.  Lab Results:   Recent Labs  09/06/15 1340 09/07/15 0506  WBC 17.2* 10.1  HGB 14.4 13.4  HCT 44.5 42.2  PLT 245 229   BMET  Recent Labs  09/06/15 1340 09/07/15 0506  NA 140 141  K 4.6 4.0  CL 106 107  CO2 23 23  GLUCOSE 206* 166*  BUN 16 17  CREATININE 0.89 0.94  CALCIUM 9.4 8.6*   PT/INR No results for input(s): LABPROT, INR in the last 72 hours. ABG No results for input(s): PHART, HCO3 in the last 72 hours.  Invalid input(s): PCO2, PO2  Studies/Results: Dg Abd 2 Views  15-Sep-2015  CLINICAL DATA:  Mid abdominal pain and small bowel obstruction EXAM: ABDOMEN - 2 VIEW COMPARISON:  09/07/2015 FINDINGS: Dilated loops of small bowel with possible fold thickening still seen the left lower quadrant, measuring up to 46 mm in diameter. No pneumatosis or pneumoperitoneum is seen. High-density layering in the stomach, presumably oral contrast. No contrast seen in the colon. Right nephrolithiasis. IMPRESSION: Ongoing partial small bowel obstruction. No progressive small bowel distention compared to admission CT.  Electronically Signed   By: Monte Fantasia M.D.   On: 2015/09/15 08:22   Dg Abd 2 Views  09/07/2015  CLINICAL DATA:  Followup for small bowel obstruction. Patient with nausea and vomiting and midline abdominal pain for 2 days. EXAM: ABDOMEN - 2 VIEW COMPARISON:  CT, 09/06/2015 FINDINGS: There is no significant bowel dilation, but there are few nonspecific air-fluid levels most evident in the central abdomen. Right intrarenal stones are noted stable from the prior CT. The smaller left lower pole intrarenal stones noted on the CT are less well defined radiographically. No other soft tissue abnormality. Residual contrast is noted in the bladder. IMPRESSION: 1. No significant bowel dilation. This would suggest an improved small bowel obstruction. There are still scattered air-fluid levels on the erect view. No free air. Electronically Signed   By: Lajean Manes M.D.   On: 09/07/2015 09:02    Anti-infectives: Anti-infectives    None      Assessment/Plan:  Partial SBO.  Rapidly resolving Advance to soft diet Possible discharge home tomorrow  Recurrent periumbilical incisional hernia.  This may have been the point of obstruction but seems to be resolving. The patient was advised to have this repaired electively, assuming that he continues to do well during this hospitalization  History complex umbilical hernia repair without mesh, partial omentectomy,  Dr. Hulen Skains.   12/13/2013  History pulmonary embolism DVT of left distal popliteal vein Diabetes mellitus Morbid obesity Chronic bilateral leg edema Obstructive sleep apnea   LOS: 3 days    Evan Leonard M 09/09/2015

## 2015-09-10 LAB — GLUCOSE, CAPILLARY: Glucose-Capillary: 115 mg/dL — ABNORMAL HIGH (ref 65–99)

## 2015-09-10 NOTE — Progress Notes (Signed)
Discharge paperwork reviewed with patient. IV removed. Patient is ready for discharge

## 2015-09-10 NOTE — Discharge Summary (Signed)
Patient ID: Evan Leonard [id number] 54 y.o. 09/01/1960  Admit date: 09/06/2015  Discharge date and time: 09/10/2015  Admitting Physician: Fuller Plan  Discharge Physician: Adin Hector  Admission Diagnoses: stomach pain  Discharge Diagnoses: Partial small bowel obstruction                                         Recurrent incisional hernia                                         History incarcerated umbilical hernia repair, partial omentectomy, without mesh Dr. 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  Takes Eliquis.                                          Operations: None  Admission Condition: fair  Discharged Condition: good  Indication for Admission: This is a 55 year old Caucasian male with morbid obesity and multiple comorbidities listed above.  He presented to the emergency room with sharp periumbilical pain and vomiting.  Last bowel movement 24 hours prior to admission and was normal.  Known to be on Eliquis because of 2 DVTs in the past.  Evaluation of admission revealed bowel sounds diminished.  No tenderness.  Small palpable hernia in the ventral area.  Well-healed transverse scar below umbilicus.     Lab work showed WBC 17,200 thought to be due to von contraction, lactic acid 1.27.  Otherwise unremarkable labs.  CT scan reveals small bowel loop in recurrent.  The hernia with transition zone thought to be present.  Hospital Course: The patient was evaluated in the emergency department by Dr. Hulen Skains.  He was admitted to the medicine service.  He was not felt to have peritonitis or have any acute surgical need.  He did not require NG tube.  Pain resolved.  Nausea resolved.  He began having bowel movements.  Abdomen remained soft and nontender.  He resumed diet and it  was best regular diet and he had multiple bowel movement was.  He wanted to go home.  Examination of the day of discharge reveals that was soft and nontender but morbidly obese.  I really could not palpate a hernia in the periumbilical area.  The skin was healthy.      He was instructed to resume all of his normal medications including Eliquis      He was advised that he is at risk for another bowel  obstruction and even emergency surgery.  He was advised to have elective surgery to repair the hernia with mesh and to return to see Dr. Hulen Skains to schedule that surgery.  He stated he would do that      He was advised to call the office on Monday and make an appointment see Dr. Hulen Skains in 7-10 days.       He was told he could return to work full time but no lifting more than 20 pounds.  Consults: Gen. surgery.  [facility] hospitalist.  Significant Diagnostic Studies: Abdominal x-rays.  CT scan.  Blood work.  Treatments: IV hydration  Disposition: Home  Patient Instructions:    Medication List    TAKE these medications        apixaban 5 MG Tabs tablet  Commonly known as:  ELIQUIS  Take 1 tablet (5 mg total) by mouth 2 (two) times daily.     furosemide 40 MG tablet  Commonly known as:  LASIX  Take 1 tablet (40 mg total) by mouth daily.     metFORMIN 500 MG tablet  Commonly known as:  GLUCOPHAGE  TAKE 1 TABLET (500 MG TOTAL) BY MOUTH 2 (TWO) TIMES DAILY WITH A MEAL.     multivitamin with minerals Tabs tablet  Take 1 tablet by mouth daily.     OVER THE COUNTER MEDICATION  Apply 1 application topically daily. Medication: Back Balm. Patient states he apply to his legs every day.      ASK your doctor about these medications        silver sulfADIAZINE 1 % cream  Commonly known as:  SILVADENE  Apply topically 2 (two) times daily.        Activity: May return to work.  May walk up stairs.  No lifting more than 20 pounds.  No sports. Diet: low fat, low cholesterol diet Wound Care: none  needed  Follow-up:  With Dr. Judeth Horn in 10 days.  Signed: Edsel Petrin. Dalbert Batman, M.D., FACS General and minimally invasive surgery Breast and Colorectal Surgery  09/10/2015, 8:12 AM

## 2016-01-20 ENCOUNTER — Other Ambulatory Visit: Payer: Self-pay | Admitting: Internal Medicine

## 2016-04-16 ENCOUNTER — Ambulatory Visit (INDEPENDENT_AMBULATORY_CARE_PROVIDER_SITE_OTHER): Payer: PRIVATE HEALTH INSURANCE | Admitting: Internal Medicine

## 2016-04-16 ENCOUNTER — Ambulatory Visit: Payer: PRIVATE HEALTH INSURANCE | Admitting: Family Medicine

## 2016-04-16 ENCOUNTER — Encounter: Payer: Self-pay | Admitting: Internal Medicine

## 2016-04-16 VITALS — BP 138/80 | Temp 98.6°F | Wt 359.4 lb

## 2016-04-16 DIAGNOSIS — Z7901 Long term (current) use of anticoagulants: Secondary | ICD-10-CM

## 2016-04-16 DIAGNOSIS — E1165 Type 2 diabetes mellitus with hyperglycemia: Secondary | ICD-10-CM | POA: Diagnosis not present

## 2016-04-16 DIAGNOSIS — Z23 Encounter for immunization: Secondary | ICD-10-CM

## 2016-04-16 DIAGNOSIS — Z6841 Body Mass Index (BMI) 40.0 and over, adult: Secondary | ICD-10-CM

## 2016-04-16 LAB — POCT GLYCOSYLATED HEMOGLOBIN (HGB A1C): Hemoglobin A1C: 10.2

## 2016-04-16 MED ORDER — METFORMIN HCL 500 MG PO TABS: 1000.0000 mg | ORAL_TABLET | Freq: Two times a day (BID) | ORAL | 0 refills | Status: DC

## 2016-04-16 MED ORDER — APIXABAN 5 MG PO TABS: 5.0000 mg | ORAL_TABLET | Freq: Two times a day (BID) | ORAL | 1 refills | Status: DC

## 2016-04-16 MED ORDER — GLIMEPIRIDE 2 MG PO TABS: 1.0000 mg | ORAL_TABLET | Freq: Every day | ORAL | 0 refills | Status: DC

## 2016-04-16 NOTE — Patient Instructions (Addendum)
Your blood sugar is out of control at this time. Check you BG twice a day . Increase the metformin to 3- 500 mg a day and then 4 500 mg a day as tolerated. If your sugars are not coming down below 200  in the next 2 weeks    Then add   The glimeprimide  1 mg per day   As we discussed I suspect it would be best to switch you over to one of the newer medications that does not cause weight gain and have you discuss this with Dr. Yong Channel. When you established.  Avoid all sweet beverages .

## 2016-04-16 NOTE — Progress Notes (Signed)
Pre visit review using our clinic review tool, if applicable. No additional management support is needed unless otherwise documented below in the visit note.  Chief Complaint  Patient presents with  . Follow-up    HPI: Evan Leonard 55 y.o.  sda   before establishing with  New PCP  dr HunterUpcoming appointment in 3 weeks.  Comes in today for an acute appointment because he is running out of his meds.  DM He has been on metformin thousand milligrams a day; checks his blood sugar once a day and says it been pretty good up until about a few weeks ago   Check sugars in am has noted some weight loss polyuria and vision changes. No numbness or low blood sugars.    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 Doesn't think his lifestyle has changed otherwise no recent infection.   He is on anticoagulation for pulmonary embolus he says this is within the last 6 months area needs a refill on his Eliquis.  No chest pain shortness of breath other change in bowel habits..    ROS: See pertinent positives and negatives per HPI. No bleeding   Past Medical History:  Diagnosis Date  . Diabetes mellitus, type II ([place]) 02/2013  . DVT (deep venous thrombosis) ([place]) 05/04/2012   LLE  . Exertional dyspnea 05/04/2012   "isolated episode" (05/05/2012)  . History of chickenpox   . Obesity   . OSA on CPAP   . Pulmonary embolism ([place]) 05/04/2012   bilaterally  . Small bowel obstruction 09/06/2015  . Varicose veins     Family History  Problem Relation Age of Onset  . Diabetes Mother   . Hypertension Mother     Social History   Social History  . Marital status: Single    Spouse name: N/A  . Number of children: N/A  . Years of education: N/A   Occupational History  . [employer]   Social History Main Topics  . Smoking status: Never Smoker  . Smokeless tobacco: Never  Used  . Alcohol use No  . Drug use: No  . Sexual activity: Not Currently   Other Topics Concern  . None   Social History Narrative  . None    Outpatient Medications Prior to Visit  Medication Sig Dispense Refill  . furosemide (LASIX) 40 MG tablet Take 1 tablet (40 mg total) by mouth daily. 30 tablet 5  . Multiple Vitamin (MULTIVITAMIN WITH MINERALS) TABS tablet Take 1 tablet by mouth daily.     Marland Kitchen OVER THE COUNTER MEDICATION Apply 1 application topically daily. Medication: Back Balm. Patient states he apply to his legs every day.    . metFORMIN (GLUCOPHAGE) 500 MG tablet TAKE 1 TABLET (500 MG TOTAL) BY MOUTH 2 (TWO) TIMES DAILY WITH A MEAL. 60 tablet 0  . apixaban (ELIQUIS) 5 MG TABS tablet Take 1 tablet (5 mg total) by mouth 2 (two) times daily. (Patient not taking: Reported on 04/16/2016) 60 tablet 3   No facility-administered medications prior to visit.      EXAM:  BP 138/80 (BP Location: Left Arm, Patient Position: Sitting, Cuff Size: Large)   Temp 98.6 F (37 C) (Oral)   Wt (!) 359 lb 6.4 oz (163 kg)   BMI 44.92 kg/m   Body  mass index is 44.92 kg/m.  GENERAL: vitals reviewed and listed above, alert, oriented, appears well hydrated and in no acute distressNontoxic pleasant normal cognition. HEENT: atraumatic, conjunctiva  clear, no obvious abnormalities on inspection of external nose and ears  NECK: no obvious masses on inspection palpation  LUNGS: clear to auscultation bilaterally, no wheezes, rales or rhonchi,  CV: HRRR, no clubbing cyanosis or  peripheral edema nl cap refill  Abdomen without obvious organomegaly or fluid wave is protuberant soft without pain. MS: moves all extremities without noticeable focal  abnormalityLegs are in his compression stockings feet not examined reports no ulcers. Skin no acute bruising or petechiae obvious. PSYCH: pleasant and cooperative, no obvious depression or anxiety  ASSESSMENT AND PLAN:  Discussed the following assessment  and plan:  Type 2 diabetes mellitus with hyperglycemia, without long-term current use of insulin (HCC) - Plan: POCT A1C  Morbid obesity with body mass index of 40.0-44.9 in adult [facility])  Anticoagulant long-term use  Encounter for immunization - Plan: Flu Vaccine QUAD 36+ mos IM patient transitioning PCP. States blood sugars out of control for just a few weeks but his A1c is high. And does have some mild symptoms. Will need full set of labs eventually    Options discussed. Would like him to be on medications that don't include long-term meds that increase weight. For now  increase his metformin to 1500 mg a day and then 2000 if tolerated. Review the different drug groups such as GLP-1 s  And   jardiance ivokanna    Not assoc with weight  gain or even insulin but for now  Intensify and add su as needed until    He establishes with PCP Dr Yong Channel  Refill eliquis today   Short term  Flu vaccine   Disc and given  Patient advised to return or notify health care team  if r new concerns arise.  Patient Instructions  Your blood sugar is out of control at this time. Check you BG twice a day . Increase the metformin to 3- 500 mg a day and then 4 500 mg a day as tolerated. If your sugars are not coming down below 200  in the next 2 weeks    Then add   The glimeprimide  1 mg per day   As we discussed I suspect it would be best to switch you over to one of the newer medications that does not cause weight gain and have you discuss this with Dr. Yong Channel. When you established.  Avoid all sweet beverages .     Standley Brooking. Egan Berkheimer M.D.

## 2016-05-09 ENCOUNTER — Encounter: Payer: Self-pay | Admitting: Family Medicine

## 2016-05-09 ENCOUNTER — Ambulatory Visit (INDEPENDENT_AMBULATORY_CARE_PROVIDER_SITE_OTHER): Payer: PRIVATE HEALTH INSURANCE | Admitting: Family Medicine

## 2016-05-09 VITALS — BP 136/82 | HR 95 | Temp 98.5°F | Ht 72.5 in | Wt 358.4 lb

## 2016-05-09 DIAGNOSIS — I2699 Other pulmonary embolism without acute cor pulmonale: Secondary | ICD-10-CM | POA: Diagnosis not present

## 2016-05-09 DIAGNOSIS — Z23 Encounter for immunization: Secondary | ICD-10-CM

## 2016-05-09 DIAGNOSIS — G4733 Obstructive sleep apnea (adult) (pediatric): Secondary | ICD-10-CM | POA: Diagnosis not present

## 2016-05-09 DIAGNOSIS — I82432 Acute embolism and thrombosis of left popliteal vein: Secondary | ICD-10-CM

## 2016-05-09 DIAGNOSIS — Z6841 Body Mass Index (BMI) 40.0 and over, adult: Secondary | ICD-10-CM

## 2016-05-09 DIAGNOSIS — E1165 Type 2 diabetes mellitus with hyperglycemia: Secondary | ICD-10-CM | POA: Diagnosis not present

## 2016-05-09 DIAGNOSIS — Z1211 Encounter for screening for malignant neoplasm of colon: Secondary | ICD-10-CM

## 2016-05-09 NOTE — Assessment & Plan Note (Signed)
S: compliant with CPAP. Still at times with some daytime sleepiness A/P: continue regular use

## 2016-05-09 NOTE — Assessment & Plan Note (Signed)
S: peak weight over 280 Wt Readings from Last 3 Encounters:  05/09/16 (!) 358 lb 6.4 oz (162.6 kg)  04/16/16 (!) 359 lb 6.4 oz (163 kg)  09/06/15 (!) 355 lb 4.8 oz (161.2 kg)  A/P: has come down some but we discussed goal of 100 lbs within a year off. Plan per avs. Extended counseling provided.

## 2016-05-09 NOTE — Progress Notes (Signed)
Subjective:  Evan Leonard is a 55 y.o. year old very pleasant male patient who presents for/with See problem oriented charting ROS- No chest pain or shortness of breath. No headache or blurry vision. Chronic vnous insufficiency uses compression stockings  Past Medical History-  Patient Active Problem List   Diagnosis Date Noted  . Diabetes mellitus with hyperglycemia ([place]) 03/31/2013    Priority: High  . Pulmonary embolism ([place]) 05/05/2012    Priority: High  . Morbid obesity with body mass index of 40.0-44.9 in adult [facility]) 05/05/2012    Priority: High  . DVT of left distal popliteal vein 05/05/2012    Priority: High  . Chronic venous insufficiency 01/25/2015    Priority: Medium  . Obstructive sleep apnea 05/05/2012    Priority: Medium  . Hyponatremia 03/23/2013    Priority: Low    Medications- reviewed and updated Current Outpatient Prescriptions  Medication Sig Dispense Refill  . apixaban (ELIQUIS) 5 MG TABS tablet Take 1 tablet (5 mg total) by mouth 2 (two) times daily. 60 tablet 1  . furosemide (LASIX) 40 MG tablet Take 1 tablet (40 mg total) by mouth daily. 30 tablet 5  . metFORMIN (GLUCOPHAGE) 500 MG tablet Take 2 tablets (1,000 mg total) by mouth 2 (two) times daily with a meal. 120 tablet 0  . Multiple Vitamin (MULTIVITAMIN WITH MINERALS) TABS tablet Take 1 tablet by mouth daily.     Marland Kitchen OVER THE COUNTER MEDICATION Apply 1 application topically daily. Medication: Back Balm. Patient states he apply to his legs every day.     No current facility-administered medications for this visit.     Objective: BP 136/82 (BP Location: Left Arm, Patient Position: Sitting, Cuff Size: Large)   Pulse 95   Temp 98.5 F (36.9 C) (Oral)   Ht 6' 0.5" (1.842 m)   Wt (!) 358 lb 6.4 oz (162.6 kg)   SpO2 96%   BMI 47.94 kg/m  Gen: NAD, resting comfortably CV: RRR no murmurs rubs or gallops Lungs: CTAB no crackles, wheeze, rhonchi Abdomen: soft/nontender/nondistended/normal bowel sounds.  No rebound or guarding.  Ext: chronic 1+ edema with venous stasis changes Skin: warm, dry  Diabetic Foot Exam - Simple   Simple Foot Form Diabetic Foot exam was performed with the following findings:  Yes 05/09/2016  3:39 PM  Visual Inspection No deformities, no ulcerations, no other skin breakdown bilaterally:  Yes Sensation Testing Intact to touch and monofilament testing bilaterally:  Yes Pulse Check Posterior Tibialis and Dorsalis pulse intact bilaterally:  Yes Comments    Assessment/Plan:  Discussed targetting <130/80 BP with wieght loss   Diabetes mellitus with hyperglycemia (HCC) S: recent increase in metformin to 1000mg  twice a day from 500mg  twice a day in late November. Sugars were well above 200 even into 300s and now with increase and cutting down some sugar intake all the way down to <135 for breakfast and before bed sugar levels. Never took glimepiride because sugars responded so well Lab Results  Component Value Date   HGBA1C 10.2 04/16/2016  7.4 a1c 8 months ago A/P: drastic improvement- likely some dietary and also change in metformin- close follow up late February to see if a1c has responded as I expect it to- would be surprised if over 8 if #s persist as noted currently. Foot exam today- he is to get updated eye exam before follow up.    Morbid obesity with body mass index of 40.0-44.9 in adult (HCC) S: peak weight over 280 Wt Readings from  Last 3 Encounters:  05/09/16 (!) 358 lb 6.4 oz (162.6 kg)  04/16/16 (!) 359 lb 6.4 oz (163 kg)  09/06/15 (!) 355 lb 4.8 oz (161.2 kg)  A/P: has come down some but we discussed goal of 100 lbs within a year off. Plan per avs. Extended counseling provided.   Pulmonary embolism (HCC) S: History of DVT and PE- DVTs have been recurrent. hypercoag workup unremarkable. Chronic anticoagulation now with Eliquis 5mg  BID A/P: continue xarelto for DVT/PE prevention   Obstructive sleep apnea S: compliant with CPAP. Still at times  with some daytime sleepiness A/P: continue regular use   CPE 3 months and a day past last a1c  Needs colonoscopy Orders Placed This Encounter  Procedures  . Tdap vaccine greater than or equal to 7yo IM  . Ambulatory referral to Gastroenterology    Referral Priority:   Routine    Referral Type:   Consultation    Referral Reason:   Specialty Services Required    Number of Visits Requested:   1    Return precautions advised.  Garret Reddish, MD

## 2016-05-09 NOTE — Assessment & Plan Note (Addendum)
S: History of DVT and PE- DVTs have been recurrent. hypercoag workup unremarkable. Chronic anticoagulation now with Eliquis 5mg  BID A/P: continue xarelto for DVT/PE prevention

## 2016-05-09 NOTE — Assessment & Plan Note (Addendum)
S: recent increase in metformin to 1000mg  twice a day from 500mg  twice a day in late November. Sugars were well above 200 even into 300s and now with increase and cutting down some sugar intake all the way down to <135 for breakfast and before bed sugar levels. Never took glimepiride because sugars responded so well Lab Results  Component Value Date   HGBA1C 10.2 04/16/2016  7.4 a1c 8 months ago A/P: drastic improvement- likely some dietary and also change in metformin- close follow up late February to see if a1c has responded as I expect it to- would be surprised if over 8 if #s persist as noted currently. Foot exam today- he is to get updated eye exam before follow up.

## 2016-05-09 NOTE — Patient Instructions (Addendum)
Sugars look way better! Thrilled metformin and some changes have worked for you in diet.   See me 07/18/16 or later for physical  Goal in that time frame is for you to lose at least 10 lbs.  1. Consider weight watchers.  2. Goal exercise 150 minutes a week. Start perhaps 10-15 minutes at a time and build up- stop immediately if chest pain or atypical shortness of breath and update [place] immediately.   Other tips: 3-5 servings of veggies a day, 3 meals a day, water only to drink, limit sweets. For breads and pastas- make these whole grain/whoel wheat.   Update your eye exam and have them send [place] a copy before you come back  Tdap today  jamie will sign you up for mychart

## 2016-05-09 NOTE — Progress Notes (Signed)
Pre visit review using our clinic review tool, if applicable. No additional management support is needed unless otherwise documented below in the visit note. 

## 2016-05-16 ENCOUNTER — Other Ambulatory Visit: Payer: Self-pay | Admitting: Internal Medicine

## 2016-06-03 IMAGING — DX DG CHEST 2V
2 series · 2 of 2 positions shown · non-contrast
Comparison: Chest radiograph from 05/30/2015

CLINICAL DATA: Acute onset of right leg swelling and erythema.
Shortness of breath. Cough. Initial encounter.

EXAM:
CHEST  2 VIEW

[chest pa]
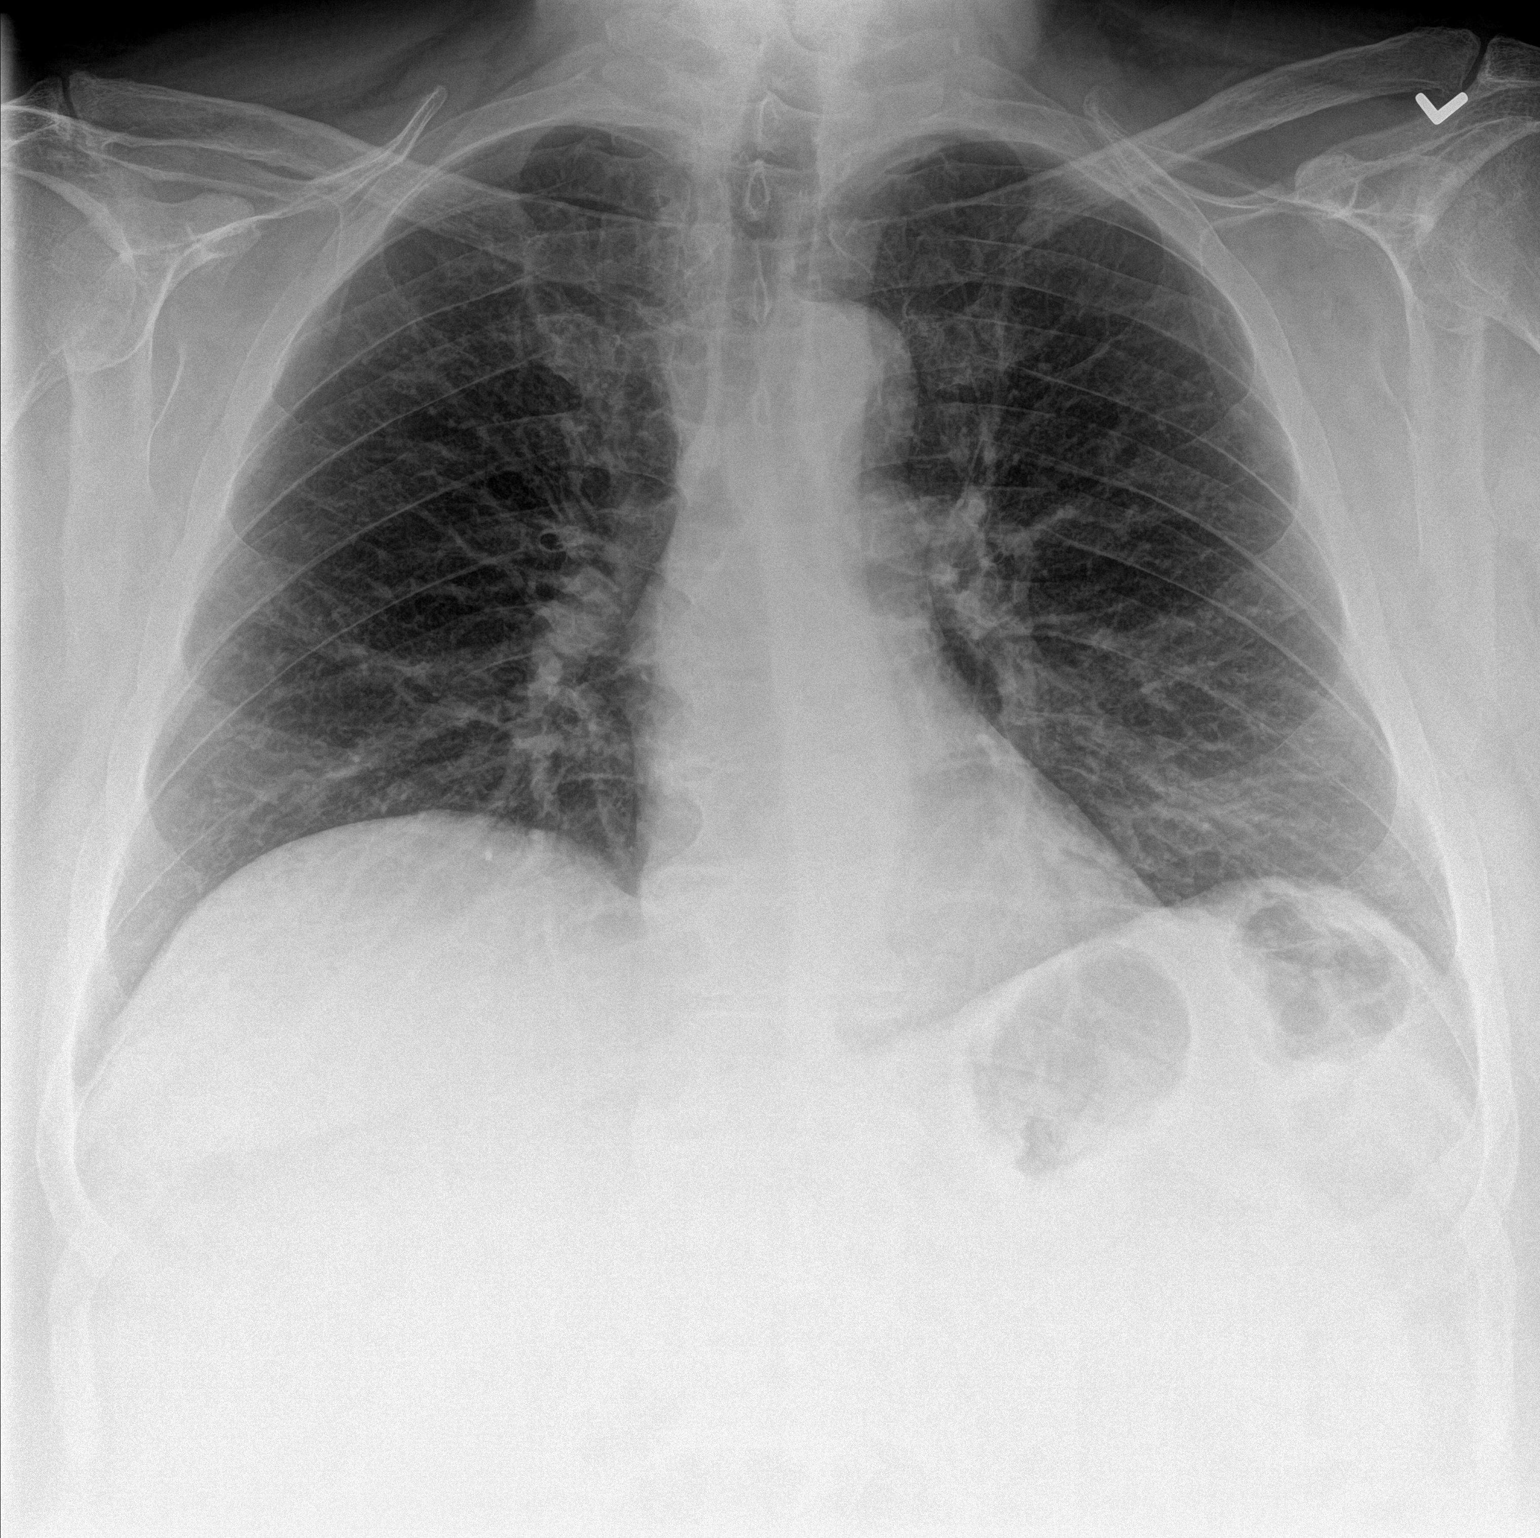

[chest lat]
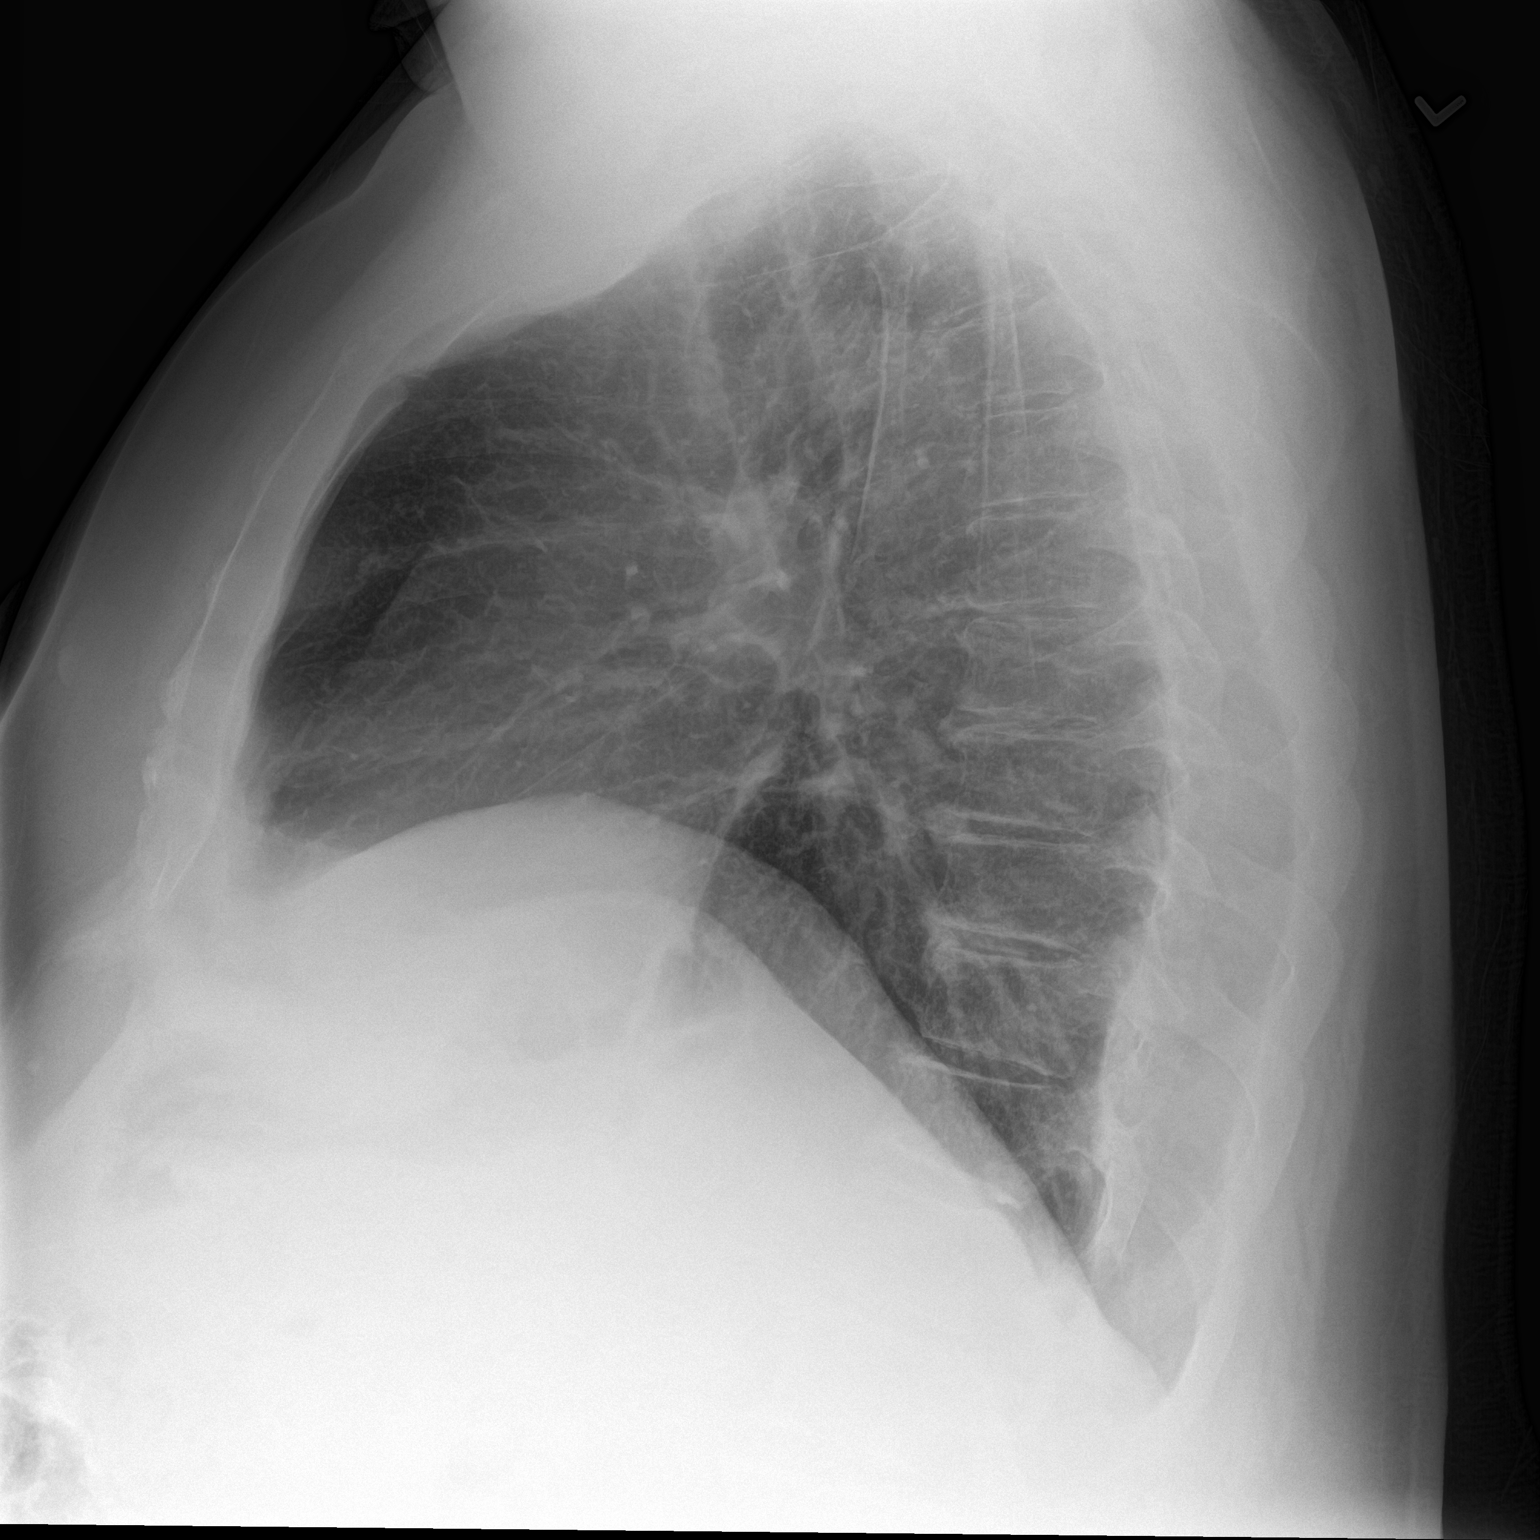

[2 of 2 positions shown; findings below may reference images not displayed]

FINDINGS: The lungs are well-aerated. Mild left basilar atelectasis is noted.
There is no evidence of pleural effusion or pneumothorax.

The heart is normal in size; the mediastinal contour is within
normal limits. No acute osseous abnormalities are seen.
IMPRESSION: Mild left basilar atelectasis noted.  Lungs otherwise clear.

## 2016-06-10 ENCOUNTER — Encounter: Payer: Self-pay | Admitting: Family Medicine

## 2016-06-14 ENCOUNTER — Other Ambulatory Visit: Payer: Self-pay | Admitting: Internal Medicine

## 2016-07-11 ENCOUNTER — Encounter: Payer: Self-pay | Admitting: Nurse Practitioner

## 2016-07-15 ENCOUNTER — Emergency Department (HOSPITAL_COMMUNITY): Payer: PRIVATE HEALTH INSURANCE

## 2016-07-15 ENCOUNTER — Inpatient Hospital Stay (HOSPITAL_COMMUNITY)
Admission: EM | Admit: 2016-07-15 | Discharge: 2016-07-19 | DRG: 394 | Disposition: A | Discharge status: Home / Self Care | Payer: PRIVATE HEALTH INSURANCE | Attending: Internal Medicine | Admitting: Internal Medicine

## 2016-07-15 ENCOUNTER — Encounter: Payer: Self-pay | Admitting: *Deleted

## 2016-07-15 DIAGNOSIS — Z86711 Personal history of pulmonary embolism: Secondary | ICD-10-CM

## 2016-07-15 DIAGNOSIS — K56609 Unspecified intestinal obstruction, unspecified as to partial versus complete obstruction: Secondary | ICD-10-CM | POA: Diagnosis not present

## 2016-07-15 DIAGNOSIS — K436 Other and unspecified ventral hernia with obstruction, without gangrene: Secondary | ICD-10-CM | POA: Diagnosis not present

## 2016-07-15 DIAGNOSIS — E1165 Type 2 diabetes mellitus with hyperglycemia: Secondary | ICD-10-CM | POA: Diagnosis present

## 2016-07-15 DIAGNOSIS — Z9889 Other specified postprocedural states: Secondary | ICD-10-CM

## 2016-07-15 DIAGNOSIS — Z86718 Personal history of other venous thrombosis and embolism: Secondary | ICD-10-CM

## 2016-07-15 DIAGNOSIS — Z7984 Long term (current) use of oral hypoglycemic drugs: Secondary | ICD-10-CM

## 2016-07-15 DIAGNOSIS — K56600 Partial intestinal obstruction, unspecified as to cause: Secondary | ICD-10-CM | POA: Diagnosis present

## 2016-07-15 DIAGNOSIS — I2699 Other pulmonary embolism without acute cor pulmonale: Secondary | ICD-10-CM

## 2016-07-15 DIAGNOSIS — Z8249 Family history of ischemic heart disease and other diseases of the circulatory system: Secondary | ICD-10-CM

## 2016-07-15 DIAGNOSIS — Z833 Family history of diabetes mellitus: Secondary | ICD-10-CM

## 2016-07-15 DIAGNOSIS — E669 Obesity, unspecified: Secondary | ICD-10-CM | POA: Diagnosis present

## 2016-07-15 DIAGNOSIS — Z8619 Personal history of other infectious and parasitic diseases: Secondary | ICD-10-CM

## 2016-07-15 DIAGNOSIS — Z79899 Other long term (current) drug therapy: Secondary | ICD-10-CM

## 2016-07-15 DIAGNOSIS — K5669 Other partial intestinal obstruction: Secondary | ICD-10-CM

## 2016-07-15 DIAGNOSIS — Z6841 Body Mass Index (BMI) 40.0 and over, adult: Secondary | ICD-10-CM

## 2016-07-15 DIAGNOSIS — G4733 Obstructive sleep apnea (adult) (pediatric): Secondary | ICD-10-CM | POA: Diagnosis present

## 2016-07-15 HISTORY — DX: Fatty (change of) liver, not elsewhere classified: K76.0

## 2016-07-15 LAB — URINALYSIS, ROUTINE W REFLEX MICROSCOPIC
Bilirubin Urine: NEGATIVE
Glucose, UA: NEGATIVE mg/dL
Ketones, ur: NEGATIVE mg/dL
Leukocytes, UA: NEGATIVE
Nitrite: NEGATIVE
Protein, ur: 30 mg/dL — AB
Specific Gravity, Urine: 1.021 (ref 1.005–1.030)
pH: 5 (ref 5.0–8.0)

## 2016-07-15 LAB — COMPREHENSIVE METABOLIC PANEL
ALT: 35 U/L (ref 17–63)
AST: 34 U/L (ref 15–41)
Albumin: 3.9 g/dL (ref 3.5–5.0)
Alkaline Phosphatase: 75 U/L (ref 38–126)
Anion gap: 14 (ref 5–15)
BUN: 15 mg/dL (ref 6–20)
CO2: 25 mmol/L (ref 22–32)
Calcium: 9.9 mg/dL (ref 8.9–10.3)
Chloride: 104 mmol/L (ref 101–111)
Creatinine, Ser: 1.07 mg/dL (ref 0.61–1.24)
GFR calc Af Amer: >60 mL/min (ref >60)
GFR calc non Af Amer: >60 mL/min (ref >60)
Glucose, Bld: 153 mg/dL — ABNORMAL HIGH (ref 65–99)
Potassium: 4.2 mmol/L (ref 3.5–5.1)
Sodium: 143 mmol/L (ref 135–145)
Total Bilirubin: 1.1 mg/dL (ref 0.3–1.2)
Total Protein: 8.4 g/dL — ABNORMAL HIGH (ref 6.5–8.1)

## 2016-07-15 LAB — CBG MONITORING, ED: Glucose-Capillary: 141 mg/dL — ABNORMAL HIGH (ref 65–99)

## 2016-07-15 LAB — CBC WITH DIFFERENTIAL/PLATELET
Basophils Absolute: 0 10*3/uL (ref 0.0–0.1)
Basophils Relative: 0 %
Eosinophils Absolute: 0.1 10*3/uL (ref 0.0–0.7)
Eosinophils Relative: 1 %
HCT: 47.9 % (ref 39.0–52.0)
Hemoglobin: 15.8 g/dL (ref 13.0–17.0)
Lymphocytes Relative: 16 %
Lymphs Abs: 1.7 10*3/uL (ref 0.7–4.0)
MCH: 29.6 pg (ref 26.0–34.0)
MCHC: 33 g/dL (ref 30.0–36.0)
MCV: 89.9 fL (ref 78.0–100.0)
Monocytes Absolute: 1.3 10*3/uL — ABNORMAL HIGH (ref 0.1–1.0)
Monocytes Relative: 12 %
Neutro Abs: 7.5 10*3/uL (ref 1.7–7.7)
Neutrophils Relative %: 71 %
Platelets: 265 10*3/uL (ref 150–400)
RBC: 5.33 MIL/uL (ref 4.22–5.81)
RDW: 15 % (ref 11.5–15.5)
WBC: 10.6 10*3/uL — ABNORMAL HIGH (ref 4.0–10.5)

## 2016-07-15 LAB — POC OCCULT BLOOD, ED: Fecal Occult Bld: NEGATIVE

## 2016-07-15 LAB — LIPASE, BLOOD: Lipase: 23 U/L (ref 11–51)

## 2016-07-15 LAB — TROPONIN I: Troponin I: 0.04 ng/mL (ref <0.03)

## 2016-07-15 MED ORDER — SODIUM CHLORIDE 0.9 % IV SOLN
Freq: Once | INTRAVENOUS | Status: AC
  Administered 2016-07-16: 01:00:00 via INTRAVENOUS

## 2016-07-15 MED ORDER — IOPAMIDOL (ISOVUE-300) INJECTION 61%
INTRAVENOUS | Status: AC
  Administered 2016-07-15: 100 mL
  Filled 2016-07-15: qty 100

## 2016-07-15 MED ORDER — METOCLOPRAMIDE HCL 5 MG/ML IJ SOLN
10.0000 mg | Freq: Once | INTRAMUSCULAR | Status: AC
  Administered 2016-07-15: 10 mg via INTRAVENOUS
  Filled 2016-07-15: qty 2

## 2016-07-15 MED ORDER — ONDANSETRON 4 MG PO TBDP
ORAL_TABLET | ORAL | Status: AC
  Filled 2016-07-15: qty 1

## 2016-07-15 MED ORDER — SODIUM CHLORIDE 0.9 % IV BOLUS (SEPSIS)
2000.0000 mL | Freq: Once | INTRAVENOUS | Status: AC
  Administered 2016-07-15: 2000 mL via INTRAVENOUS

## 2016-07-15 MED ORDER — ONDANSETRON 4 MG PO TBDP
4.0000 mg | ORAL_TABLET | Freq: Once | ORAL | Status: AC
  Administered 2016-07-15: 4 mg via ORAL

## 2016-07-15 NOTE — ED Notes (Signed)
Pt returned from CT °

## 2016-07-15 NOTE — ED Notes (Signed)
IV access attempted X2

## 2016-07-15 NOTE — ED Triage Notes (Signed)
Pt c/o generalized upper abd pain onset 3 days ago.  Pt st's started having nausea and vomiting earlier today.  Also st's he has had blood in his urine since yesterday

## 2016-07-15 NOTE — Consult Note (Addendum)
Surgical Consultation Requesting provider: Dr. Winfred Leeds  CC: abdominal pain  HPI: This is a very pleasant 56yo gentleman who has been having abdominal pain for 4-5 days which is in the midline just above the umbilicus and began to have nonbloody, nonbilious emesis today, about 5 bouts. No fever. Last bowel movement and flatus were this morning. Last eliquis dose he thinks was the evening of the 17th (unsure). The pain is actually minimal at this time. His surgical history is notable only for primary repair of incarcerated umbilical hernia in [id number].  He has undergone CT revealing chronically incarcerated recurrent ventral hernia and partial small bowel obstruction with transition point within this hernia. This is similar to the appearance of his CT in April when he was last admitted with similar presentation.     No Known Allergies  Past Medical History:  Diagnosis Date  . Diabetes mellitus, type II ([place]) 02/2013  . DVT (deep venous thrombosis) ([place]) 05/04/2012   LLE  . Exertional dyspnea 05/04/2012   "isolated episode" (05/05/2012)  . Hepatic steatosis   . History of chickenpox   . Obesity   . OSA on CPAP   . Pulmonary embolism ([place]) 05/04/2012   bilaterally  . Small bowel obstruction 09/06/2015  . Varicose veins     Past Surgical History:  Procedure Laterality Date  . HERNIA REPAIR     2015  . LAPAROTOMY N/A 12/13/2013   Procedure: EXPLORATORY LAPAROTOMY Repair ventral hernia, without mesh, partial omentectomy;  Surgeon: Gwenyth Ober, MD;  Location: [facility] OR;  Service: General;  Laterality: N/A;    Family History  Problem Relation Age of Onset  . Diabetes Mother     died age 33- also bleeding ulcer  . Hypertension Mother   . Heart failure Father     in his 41s (patient was 89). day before surgery planned  . Other Maternal Grandmother     states natural causes all grandparents    Social History   Social History  . Marital status: Single    Spouse name: N/A  . Number of  children: N/A  . Years of education: N/A   Occupational History  . [employer]   Social History Main Topics  . Smoking status: Never Smoker  . Smokeless tobacco: Never Used  . Alcohol use No  . Drug use: No  . Sexual activity: Not Currently   Other Topics Concern  . None   Social History Narrative   Single. Lives alone. Friends that live close. No pets.       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       Hobbies: golf, basketball, walking   Tired from work right now    No current facility-administered medications on file prior to encounter.    Current Outpatient Prescriptions on File Prior to Encounter  Medication Sig Dispense Refill  . ELIQUIS 5 MG TABS tablet TAKE 1 TABLET TWICE A DAY 60 tablet 1  . furosemide (LASIX) 40 MG tablet Take 1 tablet (40 mg total) by mouth daily. 30 tablet 5  . metFORMIN (GLUCOPHAGE) 500 MG tablet TAKE 2 TABLETS (1,000 MG TOTAL) BY MOUTH 2 (TWO) TIMES DAILY WITH A MEAL. 120 tablet 1  . Multiple Vitamin (MULTIVITAMIN WITH MINERALS) TABS tablet Take 1 tablet by mouth daily.     Marland Kitchen OVER THE COUNTER MEDICATION Apply 1 application topically daily. Medication: Back Balm. Patient states he apply to his legs every day.  Review of Systems: a complete, 10pt review of systems was completed with pertinent positives and negatives as documented in the HPI.   Physical Exam: Vitals:   07/15/16 2300 07/15/16 2315  BP: 119/74 147/71  Pulse: 101 102  Resp:    Temp:     Gen: A&Ox3, no distress  Head: normocephalic, atraumatic, EOMI, anicteric.  Neck: supple without mass or thyromegaly Chest: unlabored respirations, symmetrical air entry  Cardiovascular: RRR, bilateral lower extremity chronic edema R>L Abdomen: obese, soft, minimally tender in midline about 10cm superior to umbilicus. Nontender elsewhere. No palpable mass, known hernia not palpable. No rebound or guarding. Well-healed transverse  infraumbilical scar.  Extremities: warm, R>L lower extremity chronic edema with erythema/scaling of the distal right leg and brawny erythema of the left distal leg c/w chronic venous insufficiency Neuro: grossly intact Psych: appropriate mood and affect, appropriate insight Skin: other than BLE as above no lesions or rashes on limited skin exam   CBC Latest Ref Rng & Units 07/15/2016 09/07/2015 09/06/2015  WBC 4.0 - 10.5 K/uL 10.6(H) 10.1 17.2(H)  Hemoglobin 13.0 - 17.0 g/dL 15.8 13.4 14.4  Hematocrit 39.0 - 52.0 % 47.9 42.2 44.5  Platelets 150 - 400 K/uL 265 229 245    CMP Latest Ref Rng & Units 07/15/2016 09/07/2015 09/06/2015  Glucose 65 - 99 mg/dL 153(H) 166(H) 206(H)  BUN 6 - 20 mg/dL 15 17 16   Creatinine 0.61 - 1.24 mg/dL 1.07 0.94 0.89  Sodium 135 - 145 mmol/L 143 141 140  Potassium 3.5 - 5.1 mmol/L 4.2 4.0 4.6  Chloride 101 - 111 mmol/L 104 107 106  CO2 22 - 32 mmol/L 25 23 23   Calcium 8.9 - 10.3 mg/dL 9.9 8.6(L) 9.4  Total Protein 6.5 - 8.1 g/dL 8.4(H) 7.2 8.4(H)  Total Bilirubin 0.3 - 1.2 mg/dL 1.1 1.1 1.0  Alkaline Phos 38 - 126 U/L 75 61 73  AST 15 - 41 U/L 34 24 21  ALT 17 - 63 U/L 35 25 25    Lab Results  Component Value Date   INR 1.15 06/16/2015   INR 1.17 12/14/2013   INR 1.20 05/07/2012    Imaging: CLINICAL DATA:  Abdominal pain with vomiting  EXAM: CT ABDOMEN AND PELVIS WITH CONTRAST  TECHNIQUE: Multidetector CT imaging of the abdomen and pelvis was performed using the standard protocol following bolus administration of intravenous contrast.  CONTRAST:  142mL ISOVUE-300 IOPAMIDOL (ISOVUE-300) INJECTION 61%  COMPARISON:  09/06/2015, 12/10/2013  FINDINGS: Lower chest: Lung bases demonstrate no acute consolidation or pleural effusion. Coronary artery calcifications. Normal heart size.  Hepatobiliary: Hepatic steatosis. No focal hepatic abnormality. No calcified gallstones. No biliary dilatation.  Pancreas: Unremarkable. No pancreatic ductal  dilatation or surrounding inflammatory changes.  Spleen: Normal in size without focal abnormality.  Adrenals/Urinary Tract: Adrenal glands within normal limits. There are multiple calcified stones within the bilateral kidneys. Largest on the right is seen within the mid to lower pole and measures 12 mm. There are adjacent stones in the upper pole left kidney measuring up to 3 mm. Stable 19 mm exophytic hypodense lesion posterior aspect of the mid right kidney. There are small calcified stones within the posterior aspect of the bladder.  Stomach/Bowel: Stomach is nonenlarged. Multiple dilated loops of small bowel measuring up to 4.5 cm. Transition zone is at a ventral hernia just above the umbilicus. Appendix normal. Scattered colon diverticula without acute inflammation.  Vascular/Lymphatic: No significant vascular findings are present. No enlarged abdominal or pelvic lymph nodes.  Reproductive: Few  prostate gland calcifications.  No obvious mass  Other: No free air or free fluid.  Musculoskeletal: Degenerative changes. No acute or suspicious bone lesion.  IMPRESSION: 1. Findings compatible with mechanical bowel obstruction with transition point visualized at a ventral hernia just above the umbilicus. 2. Multiple intrarenal calculi. No hydronephrosis. Small stones are also present within the bladder. 3. Hepatic steatosis   Electronically Signed   By: Donavan Foil M.D.   On: 07/15/2016 22:57  A/P: 56yo male with chronically incarcerated recurrent ventral hernia presenting with partial SBO. No sepsis, leukocytosis or peritonitis. If his recollection is correct he has missed 4 doses of eliquis at this point.  Recommend observation with bowel rest, serial abdominal exams. NG tube if vomiting recurs. Continue to hold eliquis in case he needs intervention, but No plans for emergency surgery at this time. I discussed with him the options of laparoscopic vs open hernia  repair this hospitalization, which bears a very high risk of hernia recurrence and repeated need for surgery given his BMI of 47; we also discussed that ideally we get him through this partial obstruction and if he can even lose just 20lb prior to elective repair he would improve his outcome. Finally discussed that should his pain worsen or any clinical parameters worsen we may need to intervene sooner.    Romana Juniper, MD [facility], [place] Pager [phone]

## 2016-07-15 NOTE — ED Provider Notes (Addendum)
[facility] DEPT Provider Note   CSN: [id number] Arrival date & time: 07/15/16  1608     History   Chief Complaint Chief Complaint  Patient presents with  . Abdominal Pain  . Emesis    HPI Evan Leonard is a 56 y.o. male.Plains of intermittent crampy abdominal pain onset this morning. Pain last 10 or 15 minutes at a time, he is presently pain-free. He complains of nausea. He vomited 4 or 5 times today. No fever. Last bowel movement today, normal. No other associated symptoms. No treatment prior to coming here. Other associated symptoms including hematuria earlier today. COM HPI  Past Medical History:  Diagnosis Date  . Diabetes mellitus, type II ([place]) 02/2013  . DVT (deep venous thrombosis) ([place]) 05/04/2012   LLE  . Exertional dyspnea 05/04/2012   "isolated episode" (05/05/2012)  . Hepatic steatosis   . History of chickenpox   . Obesity   . OSA on CPAP   . Pulmonary embolism ([place]) 05/04/2012   bilaterally  . Small bowel obstruction 09/06/2015  . Varicose veins     Patient Active Problem List   Diagnosis Date Noted  . Chronic venous insufficiency 01/25/2015  . Diabetes mellitus with hyperglycemia ([place]) 03/31/2013  . Hyponatremia 03/23/2013  . Pulmonary embolism ([place]) 05/05/2012  . Morbid obesity with body mass index of 40.0-44.9 in adult [facility]) 05/05/2012  . Obstructive sleep apnea 05/05/2012  . DVT of left distal popliteal vein 05/05/2012    Past Surgical History:  Procedure Laterality Date  . HERNIA REPAIR     2015  . LAPAROTOMY N/A 12/13/2013   Procedure: EXPLORATORY LAPAROTOMY Repair ventral hernia, without mesh, partial omentectomy;  Surgeon: Gwenyth Ober, MD;  Location: [facility];  Service: General;  Laterality: N/A;       Home Medications    Prior to Admission medications   Medication Sig Start Date End Date Taking? Authorizing Provider  ELIQUIS 5 MG TABS tablet TAKE 1 TABLET TWICE A DAY 06/17/16  Yes [name], MD  furosemide (LASIX) 40 MG tablet  Take 1 tablet (40 mg total) by mouth daily. 06/23/15  Yes Laurey Morale, MD  metFORMIN (GLUCOPHAGE) 500 MG tablet TAKE 2 TABLETS (1,000 MG TOTAL) BY MOUTH 2 (TWO) TIMES DAILY WITH A MEAL. 05/16/16  Yes [name], MD  Multiple Vitamin (MULTIVITAMIN WITH MINERALS) TABS tablet Take 1 tablet by mouth daily.    Yes Historical Provider, MD  OVER THE COUNTER MEDICATION Apply 1 application topically daily. Medication: Back Balm. Patient states he apply to his legs every day.   Yes Historical Provider, MD    Family History Family History  Problem Relation Age of Onset  . Diabetes Mother     died age 67- also bleeding ulcer  . Hypertension Mother   . Heart failure Father     in his 67s (patient was 13). day before surgery planned  . Other Maternal Grandmother     states natural causes all grandparents    Social History Social History  Substance Use Topics  . Smoking status: Never Smoker  . Smokeless tobacco: Never Used  . Alcohol use No     Allergies   Patient has no known allergies.   Review of Systems Review of Systems  Gastrointestinal: Positive for abdominal pain.  Genitourinary: Positive for hematuria.  Allergic/Immunologic: Positive for immunocompromised state.       Diabetic  All other systems reviewed and are negative.    Physical Exam Updated Vital Signs BP 125/74  Pulse 117   Temp 98.6 F (37 C) (Oral)   Resp 18   Ht 6\' 2"  (1.88 m)   Wt (!) 370 lb (167.8 kg)   SpO2 94%   BMI 47.51 kg/m   Physical Exam  Constitutional: He appears well-developed and well-nourished.  HENT:  Head: Normocephalic and atraumatic.  Eyes: Conjunctivae are normal. Pupils are equal, round, and reactive to light.  Neck: Neck supple. No tracheal deviation present. No thyromegaly present.  Cardiovascular: Regular rhythm.   No murmur heard. Mildly tachycardic. Heart rate counted at 104 by me  Pulmonary/Chest: Effort normal and breath sounds normal.  Abdominal: Soft. Bowel  sounds are normal. He exhibits no distension. There is no tenderness.  Morbidly obese  Genitourinary: Penis normal.  Genitourinary Comments: Rectal normal tone brown stool no gross blood  Musculoskeletal: Normal range of motion. He exhibits no edema or tenderness.  Neurological: He is alert. Coordination normal.  Skin: Skin is warm and dry. No rash noted.  Psychiatric: He has a normal mood and affect.  Nursing note and vitals reviewed.    ED Treatments / Results  Labs (all labs ordered are listed, but only abnormal results are displayed) Labs Reviewed  CBC WITH DIFFERENTIAL/PLATELET - Abnormal; Notable for the following:       Result Value   WBC 10.6 (*)    Monocytes Absolute 1.3 (*)    All other components within normal limits  COMPREHENSIVE METABOLIC PANEL - Abnormal; Notable for the following:    Glucose, Bld 153 (*)    Total Protein 8.4 (*)    All other components within normal limits  CBG MONITORING, ED - Abnormal; Notable for the following:    Glucose-Capillary 141 (*)    All other components within normal limits  URINALYSIS, ROUTINE W REFLEX MICROSCOPIC  LIPASE, BLOOD  TROPONIN I  POC OCCULT BLOOD, ED    EKG  EKG Interpretation  Date/Time:  Monday July 15 2016 20:09:47 EST Ventricular Rate:  107 PR Interval:    QRS Duration: 98 QT Interval:  324 QTC Calculation: 433 R Axis:   28 Text Interpretation:  Sinus tachycardia Abnormal R-wave progression, early transition Abnormal inferior Q waves Baseline wander in lead(s) V1 V2 No significant change since last tracing Confirmed by Winfred Leeds  MD, Emric Kowalewski [phone]) on 07/15/2016 8:20:53 PM       Radiology No results found.  Procedures Procedures (including critical care time)  Medications Ordered in ED Medications  ondansetron (ZOFRAN-ODT) 4 MG disintegrating tablet (not administered)  sodium chloride 0.9 % bolus 2,000 mL (not administered)  metoCLOPramide (REGLAN) injection 10 mg (not administered)    ondansetron (ZOFRAN-ODT) disintegrating tablet 4 mg (4 mg Oral Given 07/15/16 1638)    Results for orders placed or performed during the hospital encounter of 07/15/16  CBC with Differential  Result Value Ref Range   WBC 10.6 (H) 4.0 - 10.5 K/uL   RBC 5.33 4.22 - 5.81 MIL/uL   Hemoglobin 15.8 13.0 - 17.0 g/dL   HCT 47.9 39.0 - 52.0 %   MCV 89.9 78.0 - 100.0 fL   MCH 29.6 26.0 - 34.0 pg   MCHC 33.0 30.0 - 36.0 g/dL   RDW 15.0 11.5 - 15.5 %   Platelets 265 150 - 400 K/uL   Neutrophils Relative % 71 %   Neutro Abs 7.5 1.7 - 7.7 K/uL   Lymphocytes Relative 16 %   Lymphs Abs 1.7 0.7 - 4.0 K/uL   Monocytes Relative 12 %   Monocytes Absolute  1.3 (H) 0.1 - 1.0 K/uL   Eosinophils Relative 1 %   Eosinophils Absolute 0.1 0.0 - 0.7 K/uL   Basophils Relative 0 %   Basophils Absolute 0.0 0.0 - 0.1 K/uL  Comprehensive metabolic panel  Result Value Ref Range   Sodium 143 135 - 145 mmol/L   Potassium 4.2 3.5 - 5.1 mmol/L   Chloride 104 101 - 111 mmol/L   CO2 25 22 - 32 mmol/L   Glucose, Bld 153 (H) 65 - 99 mg/dL   BUN 15 6 - 20 mg/dL   Creatinine, Ser 1.07 0.61 - 1.24 mg/dL   Calcium 9.9 8.9 - 10.3 mg/dL   Total Protein 8.4 (H) 6.5 - 8.1 g/dL   Albumin 3.9 3.5 - 5.0 g/dL   AST 34 15 - 41 U/L   ALT 35 17 - 63 U/L   Alkaline Phosphatase 75 38 - 126 U/L   Total Bilirubin 1.1 0.3 - 1.2 mg/dL   GFR calc non Af Amer >60 >60 mL/min   GFR calc Af Amer >60 >60 mL/min   Anion gap 14 5 - 15  Urinalysis, Routine w reflex microscopic  Result Value Ref Range   Color, Urine AMBER (A) YELLOW   APPearance HAZY (A) CLEAR   Specific Gravity, Urine 1.021 1.005 - 1.030   pH 5.0 5.0 - 8.0   Glucose, UA NEGATIVE NEGATIVE mg/dL   Hgb urine dipstick LARGE (A) NEGATIVE   Bilirubin Urine NEGATIVE NEGATIVE   Ketones, ur NEGATIVE NEGATIVE mg/dL   Protein, ur 30 (A) NEGATIVE mg/dL   Nitrite NEGATIVE NEGATIVE   Leukocytes, UA NEGATIVE NEGATIVE   RBC / HPF TOO NUMEROUS TO COUNT 0 - 5 RBC/hpf   WBC, UA 6-30  0 - 5 WBC/hpf   Bacteria, UA RARE (A) NONE SEEN   Squamous Epithelial / LPF 0-5 (A) NONE SEEN   Mucous PRESENT    Ca Oxalate Crys, UA PRESENT   Lipase, blood  Result Value Ref Range   Lipase 23 11 - 51 U/L  Troponin I  Result Value Ref Range   Troponin I 0.04 (HH) <0.03 ng/mL  CBG monitoring, ED  Result Value Ref Range   Glucose-Capillary 141 (H) 65 - 99 mg/dL  POC occult blood, ED  Result Value Ref Range   Fecal Occult Bld NEGATIVE NEGATIVE   Ct Abdomen Pelvis W Contrast  Result Date: 07/15/2016 CLINICAL DATA:  Abdominal pain with vomiting EXAM: CT ABDOMEN AND PELVIS WITH CONTRAST TECHNIQUE: Multidetector CT imaging of the abdomen and pelvis was performed using the standard protocol following bolus administration of intravenous contrast. CONTRAST:  127mL ISOVUE-300 IOPAMIDOL (ISOVUE-300) INJECTION 61% COMPARISON:  09/06/2015, 12/10/2013 FINDINGS: Lower chest: Lung bases demonstrate no acute consolidation or pleural effusion. Coronary artery calcifications. Normal heart size. Hepatobiliary: Hepatic steatosis. No focal hepatic abnormality. No calcified gallstones. No biliary dilatation. Pancreas: Unremarkable. No pancreatic ductal dilatation or surrounding inflammatory changes. Spleen: Normal in size without focal abnormality. Adrenals/Urinary Tract: Adrenal glands within normal limits. There are multiple calcified stones within the bilateral kidneys. Largest on the right is seen within the mid to lower pole and measures 12 mm. There are adjacent stones in the upper pole left kidney measuring up to 3 mm. Stable 19 mm exophytic hypodense lesion posterior aspect of the mid right kidney. There are small calcified stones within the posterior aspect of the bladder. Stomach/Bowel: Stomach is nonenlarged. Multiple dilated loops of small bowel measuring up to 4.5 cm. Transition zone is at a ventral hernia just  above the umbilicus. Appendix normal. Scattered colon diverticula without acute inflammation.  Vascular/Lymphatic: No significant vascular findings are present. No enlarged abdominal or pelvic lymph nodes. Reproductive: Few prostate gland calcifications.  No obvious mass Other: No free air or free fluid. Musculoskeletal: Degenerative changes. No acute or suspicious bone lesion. IMPRESSION: 1. Findings compatible with mechanical bowel obstruction with transition point visualized at a ventral hernia just above the umbilicus. 2. Multiple intrarenal calculi. No hydronephrosis. Small stones are also present within the bladder. 3. Hepatic steatosis Electronically Signed   By: Donavan Foil M.D.   On: 07/15/2016 22:57   Initial Impression / Assessment and Plan / ED Course  I have reviewed the triage vital signs and the nursing notes.  Pertinent labs & imaging results that were available during my care of the patient were reviewed by me and considered in my medical decision making (see chart for details).    11:45 PM patient reports nausea has resolved after treatment with intravenous Reglan. I consulted Dr. Windle Guard from general surgery who saw patient in the ED. She requests hospitalist to admit patient. I consulted Dr. Alcario Drought who will see patient in hospital. Plan nothing by mouth, IV hydration. Inpatient admission medical surgical floor  Final Clinical Impressions(s) / ED Diagnoses  Diagnosis#1 bowel obstruction #2Hyperglycemia Final diagnoses:  None    New Prescriptions New Prescriptions   No medications on file     Orlie Dakin, MD 07/16/16 [id number]    Orlie Dakin, MD 07/16/16 [id number]

## 2016-07-15 NOTE — ED Notes (Signed)
Patient transported to CT 

## 2016-07-15 NOTE — ED Notes (Signed)
Lab will add on lipase and troponin

## 2016-07-16 DIAGNOSIS — G4733 Obstructive sleep apnea (adult) (pediatric): Secondary | ICD-10-CM | POA: Diagnosis present

## 2016-07-16 DIAGNOSIS — K5669 Other partial intestinal obstruction: Secondary | ICD-10-CM

## 2016-07-16 DIAGNOSIS — Z833 Family history of diabetes mellitus: Secondary | ICD-10-CM | POA: Diagnosis not present

## 2016-07-16 DIAGNOSIS — K56609 Unspecified intestinal obstruction, unspecified as to partial versus complete obstruction: Secondary | ICD-10-CM | POA: Diagnosis present

## 2016-07-16 DIAGNOSIS — E1165 Type 2 diabetes mellitus with hyperglycemia: Secondary | ICD-10-CM

## 2016-07-16 DIAGNOSIS — E669 Obesity, unspecified: Secondary | ICD-10-CM | POA: Diagnosis present

## 2016-07-16 DIAGNOSIS — Z86711 Personal history of pulmonary embolism: Secondary | ICD-10-CM | POA: Diagnosis present

## 2016-07-16 DIAGNOSIS — Z8619 Personal history of other infectious and parasitic diseases: Secondary | ICD-10-CM | POA: Diagnosis not present

## 2016-07-16 DIAGNOSIS — K56600 Partial intestinal obstruction, unspecified as to cause: Secondary | ICD-10-CM | POA: Diagnosis present

## 2016-07-16 DIAGNOSIS — Z86718 Personal history of other venous thrombosis and embolism: Secondary | ICD-10-CM | POA: Diagnosis not present

## 2016-07-16 DIAGNOSIS — Z8249 Family history of ischemic heart disease and other diseases of the circulatory system: Secondary | ICD-10-CM | POA: Diagnosis not present

## 2016-07-16 DIAGNOSIS — Z79899 Other long term (current) drug therapy: Secondary | ICD-10-CM | POA: Diagnosis not present

## 2016-07-16 DIAGNOSIS — Z6841 Body Mass Index (BMI) 40.0 and over, adult: Secondary | ICD-10-CM | POA: Diagnosis not present

## 2016-07-16 DIAGNOSIS — K436 Other and unspecified ventral hernia with obstruction, without gangrene: Secondary | ICD-10-CM | POA: Diagnosis present

## 2016-07-16 DIAGNOSIS — Z7984 Long term (current) use of oral hypoglycemic drugs: Secondary | ICD-10-CM | POA: Diagnosis not present

## 2016-07-16 DIAGNOSIS — Z9889 Other specified postprocedural states: Secondary | ICD-10-CM | POA: Diagnosis not present

## 2016-07-16 LAB — GLUCOSE, CAPILLARY
Glucose-Capillary: 148 mg/dL — ABNORMAL HIGH (ref 65–99)
Glucose-Capillary: 115 mg/dL — ABNORMAL HIGH (ref 65–99)
Glucose-Capillary: 91 mg/dL (ref 65–99)

## 2016-07-16 LAB — APTT
aPTT: 52 seconds — ABNORMAL HIGH (ref 24–36)
aPTT: 56 seconds — ABNORMAL HIGH (ref 24–36)

## 2016-07-16 LAB — HIV ANTIBODY (ROUTINE TESTING W REFLEX): HIV Screen 4th Generation wRfx: NONREACTIVE

## 2016-07-16 LAB — HEPARIN LEVEL (UNFRACTIONATED): Heparin Unfractionated: 0.45 IU/mL (ref 0.30–0.70)

## 2016-07-16 MED ORDER — INSULIN ASPART 100 UNIT/ML ~~LOC~~ SOLN
0.0000 [IU] | SUBCUTANEOUS | Status: DC
  Administered 2016-07-16 – 2016-07-18 (×3): 1 [IU] via SUBCUTANEOUS

## 2016-07-16 MED ORDER — SODIUM CHLORIDE 0.9 % IV SOLN
INTRAVENOUS | Status: AC
  Administered 2016-07-16: 02:00:00 via INTRAVENOUS

## 2016-07-16 MED ORDER — ONDANSETRON HCL 4 MG/2ML IJ SOLN
4.0000 mg | Freq: Four times a day (QID) | INTRAMUSCULAR | Status: DC | PRN
  Filled 2016-07-16: qty 2

## 2016-07-16 MED ORDER — HEPARIN (PORCINE) IN NACL 100-0.45 UNIT/ML-% IJ SOLN
2600.0000 [IU]/h | INTRAMUSCULAR | Status: DC
  Administered 2016-07-16: 2000 [IU]/h via INTRAVENOUS
  Administered 2016-07-16: 2200 [IU]/h via INTRAVENOUS
  Administered 2016-07-16: 2000 [IU]/h via INTRAVENOUS
  Administered 2016-07-17 – 2016-07-19 (×5): 2600 [IU]/h via INTRAVENOUS
  Filled 2016-07-16 (×11): qty 250

## 2016-07-16 MED ORDER — SODIUM CHLORIDE 0.9 % IV SOLN
INTRAVENOUS | Status: DC
  Administered 2016-07-16 – 2016-07-19 (×7): via INTRAVENOUS

## 2016-07-16 NOTE — H&P (Signed)
History and Physical    Evan Leonard [id number] DOB: 20-Jul-1960 DOA: 07/15/2016   PCP: Garret Reddish, MD Chief Complaint:  Chief Complaint  Patient presents with  . Abdominal Pain  . Emesis    HPI: Evan Leonard is a 56 y.o. male with medical history significant of DM2 on metformin, prior SBO back in April of last year due to incarcerated ventral hernia.  Due to patients weight this was treated conservatively and successfully resolved after 2-3 days in hospital at that time patient reports.  No recurrence since then until now.  Patient presents to the ED with abd pain for past 4-5 days.  Pain located in epigastric area.  Last BM was this AM.  Last Eliquis dose the 17th.  Began to have NBNB emesis today and presented to ED for concern for recurrent SBO.  Tried no treatment at home prior to coming here, nothing makes symptoms better or worse.  ED Course: Work up in the ED does indeed have recurrent PSBO with transition point within ventral hernia which is incarcerated.  Surgery consulted (see their note), they want conservative management and medicine admission at this point.  Currently patient states pain is "mild" and isnt having "anything too severe".  Nausea controlled with zofran.  Review of Systems: As per HPI otherwise 10 point review of systems negative.    Past Medical History:  Diagnosis Date  . Diabetes mellitus, type II ([place]) 02/2013  . DVT (deep venous thrombosis) ([place]) 05/04/2012   LLE  . Exertional dyspnea 05/04/2012   "isolated episode" (05/05/2012)  . Hepatic steatosis   . History of chickenpox   . Obesity   . OSA on CPAP   . Pulmonary embolism ([place]) 05/04/2012   bilaterally  . Small bowel obstruction 09/06/2015  . Varicose veins     Past Surgical History:  Procedure Laterality Date  . HERNIA REPAIR     2015  . LAPAROTOMY N/A 12/13/2013   Procedure: EXPLORATORY LAPAROTOMY Repair ventral hernia, without mesh, partial omentectomy;  Surgeon: Gwenyth Ober,  MD;  Location: [facility];  Service: General;  Laterality: N/A;     reports that he has never smoked. He has never used smokeless tobacco. He reports that he does not drink alcohol or use drugs.  No Known Allergies  Family History  Problem Relation Age of Onset  . Diabetes Mother     died age 49- also bleeding ulcer  . Hypertension Mother   . Heart failure Father     in his 64s (patient was 26). day before surgery planned  . Other Maternal Grandmother     states natural causes all grandparents      Prior to Admission medications   Medication Sig Start Date End Date Taking? Authorizing Provider  ELIQUIS 5 MG TABS tablet TAKE 1 TABLET TWICE A DAY 06/17/16  Yes [name], MD  furosemide (LASIX) 40 MG tablet Take 1 tablet (40 mg total) by mouth daily. 06/23/15  Yes Laurey Morale, MD  metFORMIN (GLUCOPHAGE) 500 MG tablet TAKE 2 TABLETS (1,000 MG TOTAL) BY MOUTH 2 (TWO) TIMES DAILY WITH A MEAL. 05/16/16  Yes [name], MD  Multiple Vitamin (MULTIVITAMIN WITH MINERALS) TABS tablet Take 1 tablet by mouth daily.    Yes Historical Provider, MD  OVER THE COUNTER MEDICATION Apply 1 application topically daily. Medication: Back Balm. Patient states he apply to his legs every day.   Yes Historical Provider, MD    Physical Exam: Vitals:   07/16/16  0045 07/16/16 0100 07/16/16 0152 07/16/16 0511  BP: 138/68 121/67 130/88 130/76  Pulse: 97 100 (!) 101 90  Resp:   18 18  Temp:   98.6 F (37 C) 97.9 F (36.6 C)  TempSrc:   Oral Oral  SpO2: 93% 94% 96% 94%  Weight:    (!) 154.9 kg (341 lb 8 oz)  Height:   6\' 2"  (1.88 m)       Constitutional: NAD, calm, comfortable Eyes: PERRL, lids and conjunctivae normal ENMT: Mucous membranes are moist. Posterior pharynx clear of any exudate or lesions.Normal dentition.  Neck: normal, supple, no masses, no thyromegaly Respiratory: clear to auscultation bilaterally, no wheezing, no crackles. Normal respiratory effort. No accessory muscle use.    Cardiovascular: Regular rate and rhythm, no murmurs / rubs / gallops. No extremity edema. 2+ pedal pulses. No carotid bruits.  Abdomen: no tenderness, no masses palpated. No hepatosplenomegaly. Bowel sounds positive.  Musculoskeletal: no clubbing / cyanosis. No joint deformity upper and lower extremities. Good ROM, no contractures. Normal muscle tone.  Skin: no rashes, lesions, ulcers. No induration Neurologic: CN 2-12 grossly intact. Sensation intact, DTR normal. Strength 5/5 in all 4.  Psychiatric: Normal judgment and insight. Alert and oriented x 3. Normal mood.    Labs on Admission: I have personally reviewed following labs and imaging studies  CBC:  Recent Labs Lab 07/15/16 1638  WBC 10.6*  NEUTROABS 7.5  HGB 15.8  HCT 47.9  MCV 89.9  PLT 99991111   Basic Metabolic Panel:  Recent Labs Lab 07/15/16 1638  NA 143  K 4.2  CL 104  CO2 25  GLUCOSE 153*  BUN 15  CREATININE 1.07  CALCIUM 9.9   GFR: Estimated Creatinine Clearance: 119.9 mL/min (by C-G formula based on SCr of 1.07 mg/dL). Liver Function Tests:  Recent Labs Lab 07/15/16 1638  AST 34  ALT 35  ALKPHOS 75  BILITOT 1.1  PROT 8.4*  ALBUMIN 3.9    Recent Labs Lab 07/15/16 1638  LIPASE 23   No results for input(s): AMMONIA in the last 168 hours. Coagulation Profile: No results for input(s): INR, PROTIME in the last 168 hours. Cardiac Enzymes:  Recent Labs Lab 07/15/16 1638  TROPONINI 0.04*   BNP (last 3 results) No results for input(s): PROBNP in the last 8760 hours. HbA1C: No results for input(s): HGBA1C in the last 72 hours. CBG:  Recent Labs Lab 07/15/16 1734  GLUCAP 141*   Lipid Profile: No results for input(s): CHOL, HDL, LDLCALC, TRIG, CHOLHDL, LDLDIRECT in the last 72 hours. Thyroid Function Tests: No results for input(s): TSH, T4TOTAL, FREET4, T3FREE, THYROIDAB in the last 72 hours. Anemia Panel: No results for input(s): VITAMINB12, FOLATE, FERRITIN, TIBC, IRON, RETICCTPCT in  the last 72 hours. Urine analysis:    Component Value Date/Time   COLORURINE AMBER (A) 07/15/2016 1928   APPEARANCEUR HAZY (A) 07/15/2016 1928   LABSPEC 1.021 07/15/2016 1928   PHURINE 5.0 07/15/2016 1928   GLUCOSEU NEGATIVE 07/15/2016 1928   HGBUR LARGE (A) 07/15/2016 1928   BILIRUBINUR NEGATIVE 07/15/2016 1928   KETONESUR NEGATIVE 07/15/2016 1928   PROTEINUR 30 (A) 07/15/2016 1928   UROBILINOGEN 1.0 12/19/2013 1605   NITRITE NEGATIVE 07/15/2016 1928   LEUKOCYTESUR NEGATIVE 07/15/2016 1928   Sepsis Labs: @LABRCNTIP (procalcitonin:4,lacticidven:4) )No results found for this or any previous visit (from the past 240 hour(s)).   Radiological Exams on Admission: Ct Abdomen Pelvis W Contrast  Result Date: 07/15/2016 CLINICAL DATA:  Abdominal pain with vomiting EXAM: CT ABDOMEN  AND PELVIS WITH CONTRAST TECHNIQUE: Multidetector CT imaging of the abdomen and pelvis was performed using the standard protocol following bolus administration of intravenous contrast. CONTRAST:  158mL ISOVUE-300 IOPAMIDOL (ISOVUE-300) INJECTION 61% COMPARISON:  09/06/2015, 12/10/2013 FINDINGS: Lower chest: Lung bases demonstrate no acute consolidation or pleural effusion. Coronary artery calcifications. Normal heart size. Hepatobiliary: Hepatic steatosis. No focal hepatic abnormality. No calcified gallstones. No biliary dilatation. Pancreas: Unremarkable. No pancreatic ductal dilatation or surrounding inflammatory changes. Spleen: Normal in size without focal abnormality. Adrenals/Urinary Tract: Adrenal glands within normal limits. There are multiple calcified stones within the bilateral kidneys. Largest on the right is seen within the mid to lower pole and measures 12 mm. There are adjacent stones in the upper pole left kidney measuring up to 3 mm. Stable 19 mm exophytic hypodense lesion posterior aspect of the mid right kidney. There are small calcified stones within the posterior aspect of the bladder. Stomach/Bowel:  Stomach is nonenlarged. Multiple dilated loops of small bowel measuring up to 4.5 cm. Transition zone is at a ventral hernia just above the umbilicus. Appendix normal. Scattered colon diverticula without acute inflammation. Vascular/Lymphatic: No significant vascular findings are present. No enlarged abdominal or pelvic lymph nodes. Reproductive: Few prostate gland calcifications.  No obvious mass Other: No free air or free fluid. Musculoskeletal: Degenerative changes. No acute or suspicious bone lesion. IMPRESSION: 1. Findings compatible with mechanical bowel obstruction with transition point visualized at a ventral hernia just above the umbilicus. 2. Multiple intrarenal calculi. No hydronephrosis. Small stones are also present within the bladder. 3. Hepatic steatosis Electronically Signed   By: Donavan Foil M.D.   On: 07/15/2016 22:57    EKG: Independently reviewed.  Assessment/Plan Principal Problem:   Bowel obstruction Active Problems:   Diabetes mellitus with hyperglycemia ([place])   History of pulmonary embolism    1. PSBO - due to incarcerated ventral hernia 1. Surgery consult note in chart, they want conservative management at this point 2. NS at 150 3. NPO 4. Zofran PRN 5. NGT if he starts having N/V again, but no evidence of that overnight thus far. 2. DM - 1. CBG checks and sensitive SSI Q4H 2. Holding metformin 3. H/o DVT / PE - 1. Hold eliquis 2. Will do heparin per pharm consult   DVT prophylaxis: Heparin per pharm Code Status: Full Family Communication: No family in room Consults called: Surgery Admission status: Admit to inpatient   Etta Quill DO [facility] Pager [phone] from 7PM-7AM  If 7AM-7PM, please contact the day physician for the patient [email or web address] Password [facility]  07/16/2016, 5:57 AM

## 2016-07-16 NOTE — Progress Notes (Signed)
ANTICOAGULATION CONSULT NOTE - Initial Consult  Pharmacy Consult for Heparin (Apixaban on hold) Indication: History of DVT/PE  No Known Allergies  Patient Measurements: Height: 6\' 2"  (188 cm) Weight: (!) 341 lb 8 oz (154.9 kg) IBW/kg (Calculated) : 82.2  Vital Signs: Temp: 97.9 F (36.6 C) (02/20 0511) Temp Source: Oral (02/20 0511) BP: 130/76 (02/20 0511) Pulse Rate: 90 (02/20 0511)  Labs:  Recent Labs  07/15/16 1638  HGB 15.8  HCT 47.9  PLT 265  CREATININE 1.07  TROPONINI 0.04*    Estimated Creatinine Clearance: 119.9 mL/min (by C-G formula based on SCr of 1.07 mg/dL).   Medical History: Past Medical History:  Diagnosis Date  . Diabetes mellitus, type II ([place]) 02/2013  . DVT (deep venous thrombosis) ([place]) 05/04/2012   LLE  . Exertional dyspnea 05/04/2012   "isolated episode" (05/05/2012)  . Hepatic steatosis   . History of chickenpox   . Obesity   . OSA on CPAP   . Pulmonary embolism ([place]) 05/04/2012   bilaterally  . Small bowel obstruction 09/06/2015  . Varicose veins    Assessment: 56 y/o M with 4-5 days abdominal pain, CT with chronically incarcerated hernia/partial SBO, holding Apixaban and starting heparin in anticipation of possible surgery this admission. Pt has missed ~4 doses of Apixaban, OK to start heparin now. May need to use aPTT to dose heparin given apixaban influence on anti-Xa levels, will see if they correlate. CBC/renal function ok. Has required high dose heparin (up to 2950 units/hr) in the past.    Goal of Therapy:  Heparin level 0.3-0.7 units/ml aPTT 66-102 seconds Monitor platelets by anticoagulation protocol: Yes   Plan:  -Start heparin drip at 2000 units/hr -1500 HL -Daily CBC/HL -Monitor for bleeding  Narda Bonds 07/16/2016,5:59 AM

## 2016-07-16 NOTE — Progress Notes (Signed)
Paged Dr. Alcario Drought after pt arrived to floor per order.  [facility] called and said that he was aware, but that it might be a while before he made it up to the floor to see the pt.  Pt is aware.

## 2016-07-16 NOTE — Progress Notes (Signed)
ANTICOAGULATION CONSULT NOTE - Follow-up Consult  Pharmacy Consult for Heparin (Apixaban on hold) Indication: History of DVT/PE  No Known Allergies  Patient Measurements: Height: 6\' 2"  (188 cm) Weight: (!) 341 lb 8 oz (154.9 kg) IBW/kg (Calculated) : 82.2  Heparin dosing wt: 122 kg  Vital Signs: Temp: 97.9 F (36.6 C) (02/20 2203) Temp Source: Oral (02/20 2203) BP: 143/71 (02/20 2203) Pulse Rate: 75 (02/20 2203)  Labs:  Recent Labs  07/15/16 1638 07/16/16 1420 07/16/16 2209  HGB 15.8  --   --   HCT 47.9  --   --   PLT 265  --   --   APTT  --  52* 56*  HEPARINUNFRC  --  0.45  --   CREATININE 1.07  --   --   TROPONINI 0.04*  --   --     Estimated Creatinine Clearance: 119.9 mL/min (by C-G formula based on SCr of 1.07 mg/dL).  Assessment: 56 y/o M with 4-5 days abdominal pain, CT with chronically incarcerated hernia/partial SBO, holding Apixaban (last dose 2/18) and heparin started in anticipation of possible surgery this admission. Heparin level still being influenced by apixaban so utilizing PTT for monitoring heparin. PTT 56 sec (subtherapeutic) on gtt at 2200 units/hr.  Goal of Therapy:  Heparin level 0.3-0.7 units/ml aPTT 66-102 seconds Monitor platelets by anticoagulation protocol: Yes   Plan:  -Increase heparin drip to 2600 units/hr -Will f/u 6 hr PTT and heparin level  Sherlon Handing, PharmD, BCPS Clinical pharmacist, pager [phone] 07/16/2016,11:07 PM

## 2016-07-16 NOTE — Progress Notes (Signed)
ANTICOAGULATION CONSULT NOTE - Initial Consult  Pharmacy Consult for Heparin (Apixaban on hold) Indication: History of DVT/PE  No Known Allergies  Patient Measurements: Height: 6\' 2"  (188 cm) Weight: (!) 341 lb 8 oz (154.9 kg) IBW/kg (Calculated) : 82.2  Heparin dosing weight: 122 kg  Vital Signs: Temp: 98.2 F (36.8 C) (02/20 1306) Temp Source: Oral (02/20 1306) BP: 131/76 (02/20 1306) Pulse Rate: 79 (02/20 1306)   Assessment: 56 y/o M on Eliquis 5mg  PO BID PTA for hx of DVT / PE. Currently holding and transitioned to heparin gtt in case there is a need for surgical intervention. First aPTT is low at 52. Heparin level 0.45 which is almost correlating with aPTT. CBC stable, no s/s of bleed.  Goal of Therapy:  Heparin level 0.3-0.7 units/ml aPTT 66-102 seconds Monitor platelets by anticoagulation protocol: Yes   Plan:  Increase heparin gtt to 2,200 units/hr Check aPTT in 6 hrs Monitor daily aPTT / heparin level, CBC, s/s of bleed  Tonetta Napoles J 07/16/2016,3:45 PM

## 2016-07-16 NOTE — Progress Notes (Signed)
[facility] Progress Note     Subjective: Patient states his abdominal pain has improved. Pain was a sharp pain now it is a mild dull constant pain located in the epigastric region. Patient has had flatus since arrival to the hospital. He has not had any nausea vomiting since arrival to the hospital. No fevers or chills.  Objective: Vital signs in last 24 hours: Temp:  [97.9 F (36.6 C)-98.6 F (37 C)] 97.9 F (36.6 C) (02/20 0511) Pulse Rate:  [90-123] 90 (02/20 0511) Resp:  [18-24] 18 (02/20 0511) BP: (119-147)/(63-100) 130/76 (02/20 0511) SpO2:  [92 %-98 %] 94 % (02/20 0511) Weight:  [341 lb 8 oz (154.9 kg)-370 lb (167.8 kg)] 341 lb 8 oz (154.9 kg) (02/20 0511) Last BM Date: 07/15/16  Intake/Output from previous day: 02/19 0701 - 02/20 0700 In: 2718.8 [I.V.:718.8; IV Piggyback:2000] Out: 0  Intake/Output this shift: No intake/output data recorded.  PE: Gen:  Alert, NAD, pleasant, cooperative, obese white male lying in bed, well-appearing Card:  RRR, no M/G/R heard Pulm:  Rate and effort normal Abd: Soft, obese, nondistended, hyperactive bowel sounds, no hernia appreciated, mild TTP to epigastric region, well-healed transverse infraumbilical scar. Skin: not diaphoretic, warm and dry  Lab Results:   Recent Labs  07/15/16 1638  WBC 10.6*  HGB 15.8  HCT 47.9  PLT 265   BMET  Recent Labs  07/15/16 1638  NA 143  K 4.2  CL 104  CO2 25  GLUCOSE 153*  BUN 15  CREATININE 1.07  CALCIUM 9.9   PT/INR No results for input(s): LABPROT, INR in the last 72 hours. CMP     Component Value Date/Time   NA 143 07/15/2016 1638   K 4.2 07/15/2016 1638   CL 104 07/15/2016 1638   CO2 25 07/15/2016 1638   GLUCOSE 153 (H) 07/15/2016 1638   BUN 15 07/15/2016 1638   CREATININE 1.07 07/15/2016 1638   CALCIUM 9.9 07/15/2016 1638   PROT 8.4 (H) 07/15/2016 1638   ALBUMIN 3.9 07/15/2016 1638   AST 34 07/15/2016 1638   ALT 35 07/15/2016 1638   ALKPHOS 75  07/15/2016 1638   BILITOT 1.1 07/15/2016 1638   GFRNONAA >60 07/15/2016 1638   GFRAA >60 07/15/2016 1638   Lipase     Component Value Date/Time   LIPASE 23 07/15/2016 1638       Studies/Results: Ct Abdomen Pelvis W Contrast  Result Date: 07/15/2016 CLINICAL DATA:  Abdominal pain with vomiting EXAM: CT ABDOMEN AND PELVIS WITH CONTRAST TECHNIQUE: Multidetector CT imaging of the abdomen and pelvis was performed using the standard protocol following bolus administration of intravenous contrast. CONTRAST:  11mL ISOVUE-300 IOPAMIDOL (ISOVUE-300) INJECTION 61% COMPARISON:  09/06/2015, 12/10/2013 FINDINGS: Lower chest: Lung bases demonstrate no acute consolidation or pleural effusion. Coronary artery calcifications. Normal heart size. Hepatobiliary: Hepatic steatosis. No focal hepatic abnormality. No calcified gallstones. No biliary dilatation. Pancreas: Unremarkable. No pancreatic ductal dilatation or surrounding inflammatory changes. Spleen: Normal in size without focal abnormality. Adrenals/Urinary Tract: Adrenal glands within normal limits. There are multiple calcified stones within the bilateral kidneys. Largest on the right is seen within the mid to lower pole and measures 12 mm. There are adjacent stones in the upper pole left kidney measuring up to 3 mm. Stable 19 mm exophytic hypodense lesion posterior aspect of the mid right kidney. There are small calcified stones within the posterior aspect of the bladder. Stomach/Bowel: Stomach is nonenlarged. Multiple dilated loops of small bowel measuring up to 4.5 cm.  Transition zone is at a ventral hernia just above the umbilicus. Appendix normal. Scattered colon diverticula without acute inflammation. Vascular/Lymphatic: No significant vascular findings are present. No enlarged abdominal or pelvic lymph nodes. Reproductive: Few prostate gland calcifications.  No obvious mass Other: No free air or free fluid. Musculoskeletal: Degenerative changes. No acute  or suspicious bone lesion. IMPRESSION: 1. Findings compatible with mechanical bowel obstruction with transition point visualized at a ventral hernia just above the umbilicus. 2. Multiple intrarenal calculi. No hydronephrosis. Small stones are also present within the bladder. 3. Hepatic steatosis Electronically Signed   By: Donavan Foil M.D.   On: 07/15/2016 22:57    Anti-infectives: Anti-infectives    None       Assessment/Plan  Incarcerated recurrent ventral hernia - CT scan findings compatible with mechanical bowel obstruction with transition point visualized at the ventral hernia just above the umbilicus - No peritonitis or sepsis, WBC 10.6 - Primary repair of incarcerated umbilical hernia in [id number] - Ideally an elective procedure would be preferred. if patient does not improve then surgery will be considered. Would ideally like to see weight loss prior to repair - Heparin per primary, continue to hold Eliquis  Patient having flatus, no nausea or vomiting, and abdominal pain has improved. Will advance patient to sips from the floor. We will continue to follow. NG tube if nausea and vomiting returns. Hopefully this will resolve without need for surgical intervention.    LOS: 0 days    Kalman Drape , [facility] 07/16/2016, 9:02 AM Pager: [phone] Consults: [phone] Mon-Fri 7:00 am-4:30 pm Sat-Sun 7:00 am-11:30 am

## 2016-07-16 NOTE — Progress Notes (Signed)
[facility]                                                                              Patient Demographics  Evan Leonard, is a 56 y.o. male, DOB - Apr 08, 1961, [id number]  Admit date - 07/15/2016   Admitting Physician Hillary Bow, DO  Outpatient Primary MD for the patient is Tana Conch, MD  Outpatient specialists:   LOS - 0  days    Chief Complaint  Patient presents with  . Abdominal Pain  . Emesis       Brief summary   Patient is a 56 year old male with diabetes on metformin, prior SBO in April of last year due to incarcerated ventral hernia, no recurrence since then. The patient presented to ED with abdominal pain for past 4-5 days in the epigastric area associated with nausea and vomiting. CT of the abdomen and pelvis showed mechanical bowel obstruction with transition point at ventral hernia just above the umbilicus. Surgery was consulted and patient was admitted for further workup.   Assessment & Plan    Principal Problem:   Partial small bowel obstruction secondary to incarcerated ventral hernia - Continue NPO status, IV fluids and pain control. - NG tube if patient starts having any nausea and vomiting again - Gen. surgery consulted, currently recommending conservative management  Active Problems:   Diabetes mellitus with hyperglycemia (HCC) - Hold metformin, continue sliding scale insulin q4hrs     History of pulmonary embolism - hold eliquis - Continue heparin IV per pharmacy  Morbid obesity Counseled the patient on diet and weight control  Code Status: full  DVT Prophylaxis: Heparin drip Family Communication: Discussed in detail with the patient, all imaging results, lab results explained to the patient   Disposition Plan:  Time Spent in minutes  25 minutes  Procedures:    Consultants:   Gen. surgery  Antimicrobials:      Medications  Scheduled Meds: . sodium chloride   Intravenous STAT  . insulin aspart   0-9 Units Subcutaneous Q4H   Continuous Infusions: . sodium chloride 125 mL/hr at 07/16/16 0932  . heparin 2,000 Units/hr (07/16/16 8295)   PRN Meds:.ondansetron   Antibiotics   Anti-infectives    None        Subjective:   Evan Leonard was seen and examined today.Still complaining of epigastric pain 4/10, no nausea or vomiting, no fevers or chills. Patient denies dizziness, chest pain, shortness of breath,  new weakness, numbess, tingling. No acute events overnight.    Objective:   Vitals:   07/16/16 0100 07/16/16 0152 07/16/16 0511 07/16/16 1100  BP: 121/67 130/88 130/76 127/75  Pulse: 100 (!) 101 90 80  Resp:  18 18   Temp:  98.6 F (37 C) 97.9 F (36.6 C) 97.6 F (36.4 C)  TempSrc:  Oral Oral Oral  SpO2: 94% 96% 94% 97%  Weight:   (!) 154.9 kg (341 lb 8 oz)   Height:  6\' 2"  (1.88 m)      Intake/Output Summary (Last 24 hours) at 07/16/16 1114 Last data filed at 07/16/16 0932  Gross per 24 hour  Intake  2718.83 ml  Output              350 ml  Net          2368.83 ml     Wt Readings from Last 3 Encounters:  07/16/16 (!) 154.9 kg (341 lb 8 oz)  05/09/16 (!) 162.6 kg (358 lb 6.4 oz)  04/16/16 (!) 163 kg (359 lb 6.4 oz)     Exam  General: Alert and oriented x 3, NAD  HEENT:   Neck: Supple, no JVD, no masses  Cardiovascular: S1 S2 auscultated, no rubs, murmurs or gallops. Regular rate and rhythm.  Respiratory: Clear to auscultation bilaterally, no wheezing, rales or rhonchi  Gastrointestinal: Obese, Soft, tender in the epigastric region, hyperactive bowel sounds   Ext: no cyanosis clubbing or edema  Neuro: AAOx3, Cr N's II- XII. Strength 5/5 upper and lower extremities bilaterally  Skin: No rashes  Psych: Normal affect and demeanor, alert and oriented x3    Data Reviewed:  I have personally reviewed following labs and imaging studies  Micro Results No results found for this or any previous visit (from the past 240  hour(s)).  Radiology Reports Ct Abdomen Pelvis W Contrast  Result Date: 07/15/2016 CLINICAL DATA:  Abdominal pain with vomiting EXAM: CT ABDOMEN AND PELVIS WITH CONTRAST TECHNIQUE: Multidetector CT imaging of the abdomen and pelvis was performed using the standard protocol following bolus administration of intravenous contrast. CONTRAST:  ISOVUE-300 IOPAMIDOL (ISOVUE-300) INJECTION 61% COMPARISON:  09/06/2015, 12/10/2013 FINDINGS: Lower chest: Lung bases demonstrate no acute consolidation or pleural effusion. Coronary artery calcifications. Normal heart size. Hepatobiliary: Hepatic steatosis. No focal hepatic abnormality. No calcified gallstones. No biliary dilatation. Pancreas: Unremarkable. No pancreatic ductal dilatation or surrounding inflammatory changes. Spleen: Normal in size without focal abnormality. Adrenals/Urinary Tract: Adrenal glands within normal limits. There are multiple calcified stones within the bilateral kidneys. Largest on the right is seen within the mid to lower pole and measures 12 mm. There are adjacent stones in the upper pole left kidney measuring up to 3 mm. Stable 19 mm exophytic hypodense lesion posterior aspect of the mid right kidney. There are small calcified stones within the posterior aspect of the bladder. Stomach/Bowel: Stomach is nonenlarged. Multiple dilated loops of small bowel measuring up to 4.5 cm. Transition zone is at a ventral hernia just above the umbilicus. Appendix normal. Scattered colon diverticula without acute inflammation. Vascular/Lymphatic: No significant vascular findings are present. No enlarged abdominal or pelvic lymph nodes. Reproductive: Few prostate gland calcifications.  No obvious mass Other: No free air or free fluid. Musculoskeletal: Degenerative changes. No acute or suspicious bone lesion. IMPRESSION: 1. Findings compatible with mechanical bowel obstruction with transition point visualized at a ventral hernia just above the umbilicus. 2.  Multiple intrarenal calculi. No hydronephrosis. Small stones are also present within the bladder. 3. Hepatic steatosis Electronically Signed   By: Jasmine Pang M.D.   On: 07/15/2016 22:57    Lab Data:  CBC:  Recent Labs Lab 07/15/16 1638  WBC 10.6*  NEUTROABS 7.5  HGB 15.8  HCT 47.9  MCV 89.9  PLT 265   Basic Metabolic Panel:  Recent Labs Lab 07/15/16 1638  NA 143  K 4.2  CL 104  CO2 25  GLUCOSE 153*  BUN 15  CREATININE 1.07  CALCIUM 9.9   GFR: Estimated Creatinine Clearance: 119.9 mL/min (by C-G formula based on SCr of 1.07 mg/dL). Liver Function Tests:  Recent Labs Lab 07/15/16 1638  AST 34  ALT 35  ALKPHOS 75  BILITOT 1.1  PROT 8.4*  ALBUMIN 3.9    Recent Labs Lab 07/15/16 1638  LIPASE 23   No results for input(s): AMMONIA in the last 168 hours. Coagulation Profile: No results for input(s): INR, PROTIME in the last 168 hours. Cardiac Enzymes:  Recent Labs Lab 07/15/16 1638  TROPONINI 0.04*   BNP (last 3 results) No results for input(s): PROBNP in the last 8760 hours. HbA1C: No results for input(s): HGBA1C in the last 72 hours. CBG:  Recent Labs Lab 07/15/16 1734 07/16/16 0758  GLUCAP 141* 148*   Lipid Profile: No results for input(s): CHOL, HDL, LDLCALC, TRIG, CHOLHDL, LDLDIRECT in the last 72 hours. Thyroid Function Tests: No results for input(s): TSH, T4TOTAL, FREET4, T3FREE, THYROIDAB in the last 72 hours. Anemia Panel: No results for input(s): VITAMINB12, FOLATE, FERRITIN, TIBC, IRON, RETICCTPCT in the last 72 hours. Urine analysis:    Component Value Date/Time   COLORURINE AMBER (A) 07/15/2016 1928   APPEARANCEUR HAZY (A) 07/15/2016 1928   LABSPEC 1.021 07/15/2016 1928   PHURINE 5.0 07/15/2016 1928   GLUCOSEU NEGATIVE 07/15/2016 1928   HGBUR LARGE (A) 07/15/2016 1928   BILIRUBINUR NEGATIVE 07/15/2016 1928   KETONESUR NEGATIVE 07/15/2016 1928   PROTEINUR 30 (A) 07/15/2016 1928   UROBILINOGEN 1.0 12/19/2013 1605    NITRITE NEGATIVE 07/15/2016 1928   LEUKOCYTESUR NEGATIVE 07/15/2016 1928     Evan Leonard M.D. [facility] 07/16/2016, 11:14 AM  Pager: [phone] Between 7am to 7pm - call Pager - [phone]  After 7pm go to [email or web address] - password [email or web address]  Call night coverage person covering after 7pm

## 2016-07-16 NOTE — Progress Notes (Signed)
Evan Leonard notified that pt is admitted, but admitting doctor has not made it up to see the pt.  Asked for zofran since pt is c/o nausea.  No vomiting. Will continue to monitor.

## 2016-07-17 LAB — GLUCOSE, CAPILLARY
Glucose-Capillary: 107 mg/dL — ABNORMAL HIGH (ref 65–99)
Glucose-Capillary: 115 mg/dL — ABNORMAL HIGH (ref 65–99)
Glucose-Capillary: 135 mg/dL — ABNORMAL HIGH (ref 65–99)
Glucose-Capillary: 88 mg/dL (ref 65–99)
Glucose-Capillary: 96 mg/dL (ref 65–99)
Glucose-Capillary: 96 mg/dL (ref 65–99)
Glucose-Capillary: 97 mg/dL (ref 65–99)

## 2016-07-17 LAB — HEPARIN LEVEL (UNFRACTIONATED)
Heparin Unfractionated: 0.58 IU/mL (ref 0.30–0.70)
Heparin Unfractionated: 0.65 IU/mL (ref 0.30–0.70)

## 2016-07-17 LAB — APTT
aPTT: 91 seconds — ABNORMAL HIGH (ref 24–36)
aPTT: 91 seconds — ABNORMAL HIGH (ref 24–36)

## 2016-07-17 NOTE — Care Management Note (Signed)
Case Management Note  Patient Details  Name: Evan Leonard MRN: [id number] Date of Birth: Apr 11, 1961  Subjective/Objective:                    Action/Plan: Adv to clear liquid diet, will continue to follow  Expected Discharge Date:                  Expected Discharge Plan:  Home/Self Care  In-House Referral:     Discharge planning Services     Post Acute Care Choice:    Choice offered to:     DME Arranged:    DME Agency:     HH Arranged:    [place] Agency:     Status of Service:  In process, will continue to follow  If discussed at Long Length of Stay Meetings, dates discussed:    Additional Comments:  Marilu Favre, RN 07/17/2016, 11:22 AM

## 2016-07-17 NOTE — Progress Notes (Signed)
ANTICOAGULATION CONSULT NOTE - Follow-up Consult  Pharmacy Consult for Heparin (Apixaban on hold) Indication: History of DVT/PE  No Known Allergies  Patient Measurements: Height: 6\' 2"  (188 cm) Weight: (!) 341 lb 8 oz (154.9 kg) IBW/kg (Calculated) : 82.2  Heparin dosing wt: 122 kg  Vital Signs: Temp: 97.7 F (36.5 C) (02/21 0456) Temp Source: Oral (02/21 0456) BP: 150/81 (02/21 0456) Pulse Rate: 76 (02/21 0456)  Labs:  Recent Labs  07/15/16 1638 07/16/16 1420 07/16/16 2209 07/17/16 0537  HGB 15.8  --   --   --   HCT 47.9  --   --   --   PLT 265  --   --   --   APTT  --  52* 56* 91*  HEPARINUNFRC  --  0.45  --  0.58  CREATININE 1.07  --   --   --   TROPONINI 0.04*  --   --   --     Estimated Creatinine Clearance: 119.9 mL/min (by C-G formula based on SCr of 1.07 mg/dL).  Assessment: 56 y/o M with 4-5 days abdominal pain, CT with chronically incarcerated hernia/partial SBO, holding Apixaban (last dose 2/18) and heparin started in anticipation of possible surgery this admission. PTT 91 sec (subtherapeutic) on gtt at 2200 units/hr. Heparin level 0.58 (therapeutic) - appears that heparin level and PTT may be correlating.  Goal of Therapy:  Heparin level 0.3-0.7 units/ml aPTT 66-102 seconds Monitor platelets by anticoagulation protocol: Yes   Plan:  -Continue heparin drip at 2600 units/hr -Will f/u 6 hr confirmatory PTT and heparin level  Sherlon Handing, PharmD, BCPS Clinical pharmacist, pager [phone] 07/17/2016,6:34 AM

## 2016-07-17 NOTE — Progress Notes (Signed)
[facility] Progress Note     Subjective: Mild abdominal cramping. No abdominal pain. Pt is having flatus and had a normal BM this morning. Very mild nausea. States he does not want anything for the nausea.   Objective: Vital signs in last 24 hours: Temp:  [97.6 F (36.4 C)-98.2 F (36.8 C)] 97.7 F (36.5 C) (02/21 0456) Pulse Rate:  [75-81] 76 (02/21 0456) Resp:  [17-18] 18 (02/21 0456) BP: (127-150)/(71-81) 150/81 (02/21 0456) SpO2:  [95 %-98 %] 98 % (02/21 0456) Last BM Date: 07/15/16  Intake/Output from previous day: 02/20 0701 - 02/21 0700 In: 2939.6 [I.V.:2939.6] Out: 1900 [Urine:1900] Intake/Output this shift: No intake/output data recorded.  PE: Gen:  Alert, NAD, pleasant, cooperative, obese white male lying in bed, well-appearing Card:  RRR, no M/G/R heard Pulm:  Rate and effort normal Abd: Soft, obese, nondistended, normal bowel sounds, no hernia appreciated, mild TTP to epigastric region, well-healed transverse infraumbilical scar. Skin: not diaphoretic, warm and dry  Lab Results:   Recent Labs  07/15/16 1638  WBC 10.6*  HGB 15.8  HCT 47.9  PLT 265   BMET  Recent Labs  07/15/16 1638  NA 143  K 4.2  CL 104  CO2 25  GLUCOSE 153*  BUN 15  CREATININE 1.07  CALCIUM 9.9   PT/INR No results for input(s): LABPROT, INR in the last 72 hours. CMP     Component Value Date/Time   NA 143 07/15/2016 1638   K 4.2 07/15/2016 1638   CL 104 07/15/2016 1638   CO2 25 07/15/2016 1638   GLUCOSE 153 (H) 07/15/2016 1638   BUN 15 07/15/2016 1638   CREATININE 1.07 07/15/2016 1638   CALCIUM 9.9 07/15/2016 1638   PROT 8.4 (H) 07/15/2016 1638   ALBUMIN 3.9 07/15/2016 1638   AST 34 07/15/2016 1638   ALT 35 07/15/2016 1638   ALKPHOS 75 07/15/2016 1638   BILITOT 1.1 07/15/2016 1638   GFRNONAA >60 07/15/2016 1638   GFRAA >60 07/15/2016 1638   Lipase     Component Value Date/Time   LIPASE 23 07/15/2016 1638       Studies/Results: Ct  Abdomen Pelvis W Contrast  Result Date: 07/15/2016 CLINICAL DATA:  Abdominal pain with vomiting EXAM: CT ABDOMEN AND PELVIS WITH CONTRAST TECHNIQUE: Multidetector CT imaging of the abdomen and pelvis was performed using the standard protocol following bolus administration of intravenous contrast. CONTRAST:  169mL ISOVUE-300 IOPAMIDOL (ISOVUE-300) INJECTION 61% COMPARISON:  09/06/2015, 12/10/2013 FINDINGS: Lower chest: Lung bases demonstrate no acute consolidation or pleural effusion. Coronary artery calcifications. Normal heart size. Hepatobiliary: Hepatic steatosis. No focal hepatic abnormality. No calcified gallstones. No biliary dilatation. Pancreas: Unremarkable. No pancreatic ductal dilatation or surrounding inflammatory changes. Spleen: Normal in size without focal abnormality. Adrenals/Urinary Tract: Adrenal glands within normal limits. There are multiple calcified stones within the bilateral kidneys. Largest on the right is seen within the mid to lower pole and measures 12 mm. There are adjacent stones in the upper pole left kidney measuring up to 3 mm. Stable 19 mm exophytic hypodense lesion posterior aspect of the mid right kidney. There are small calcified stones within the posterior aspect of the bladder. Stomach/Bowel: Stomach is nonenlarged. Multiple dilated loops of small bowel measuring up to 4.5 cm. Transition zone is at a ventral hernia just above the umbilicus. Appendix normal. Scattered colon diverticula without acute inflammation. Vascular/Lymphatic: No significant vascular findings are present. No enlarged abdominal or pelvic lymph nodes. Reproductive: Few prostate gland calcifications.  No obvious  mass Other: No free air or free fluid. Musculoskeletal: Degenerative changes. No acute or suspicious bone lesion. IMPRESSION: 1. Findings compatible with mechanical bowel obstruction with transition point visualized at a ventral hernia just above the umbilicus. 2. Multiple intrarenal calculi. No  hydronephrosis. Small stones are also present within the bladder. 3. Hepatic steatosis Electronically Signed   By: Donavan Foil M.D.   On: 07/15/2016 22:57    Anti-infectives: Anti-infectives    None       Assessment/Plan Incarcerated recurrent ventral hernia - CT scan findings compatible with mechanical bowel obstruction with transition point visualized at the ventral hernia just above the umbilicus - No peritonitis or sepsis - Primary repair of incarcerated umbilical hernia in [id number] - Ideally an elective procedure would be preferred. if patient does not improve then surgery will be considered. Would ideally like to see weight loss prior to repair - Heparin per primary, continue to hold Eliquis  Patient having flatus, no vomiting, abdominal pain has improved and he had a normal bowel movement this morning. Will advance patient to clear liquids. We will continue to follow. Hopefully this will resolve without need for surgical intervention.   LOS: 1 day    Kalman Drape , [facility] 07/17/2016, 9:32 AM Pager: [phone] Consults: [phone] Mon-Fri 7:00 am-4:30 pm Sat-Sun 7:00 am-11:30 am

## 2016-07-17 NOTE — Progress Notes (Signed)
ANTICOAGULATION CONSULT NOTE - Follow-up Consult  Pharmacy Consult for Heparin (Apixaban on hold) Indication: History of DVT/PE  No Known Allergies  Patient Measurements: Height: 6\' 2"  (188 cm) Weight: (!) 341 lb 8 oz (154.9 kg) IBW/kg (Calculated) : 82.2  Heparin dosing wt: 122 kg  Vital Signs: Temp: 97.7 F (36.5 C) (02/21 0456) Temp Source: Oral (02/21 0456) BP: 150/81 (02/21 0456) Pulse Rate: 76 (02/21 0456)  Labs:  Recent Labs  07/15/16 1638  07/16/16 1420 07/16/16 2209 07/17/16 0537 07/17/16 1123  HGB 15.8  --   --   --   --   --   HCT 47.9  --   --   --   --   --   PLT 265  --   --   --   --   --   APTT  --   < > 52* 56* 91* 91*  HEPARINUNFRC  --   --  0.45  --  0.58 0.65  CREATININE 1.07  --   --   --   --   --   TROPONINI 0.04*  --   --   --   --   --   < > = values in this interval not displayed.  Estimated Creatinine Clearance: 119.9 mL/min (by C-G formula based on SCr of 1.07 mg/dL).  Assessment: 56 y/o M on Eliquis 5mg  PO BID PTA for hx of DVT / PE. Currently holding and transitioned to heparin gtt in case there is a need for surgical intervention. Heparin level and aPTT seems to be correlating now and are therapeutic. CBC stable, no s/s of bleed.  Goal of Therapy:  Heparin level 0.3-0.7 Monitor platelets by anticoagulation protocol: Yes   Plan:  Continue heparin gtt at 2,600 units/hr Monitor daily heparin level, CBC, s/s of bleed  [name], PharmD, [facility] Clinical Pharmacist Pager [phone] 07/17/2016 12:27 PM

## 2016-07-17 NOTE — Progress Notes (Signed)
[facility]                                                                              Patient Demographics  Delvonta Rootes, is a 56 y.o. male, DOB - [birth year], OZD:[id number]  Admit date - 07/15/2016   Admitting Physician Hillary Bow, DO  Outpatient Primary MD for the patient is Tana Conch, MD  Outpatient specialists:   LOS - 1  days    Chief Complaint  Patient presents with  . Abdominal Pain  . Emesis       Brief summary   Patient is a 56 year old male with diabetes on metformin, prior SBO in April of last year due to incarcerated ventral hernia, no recurrence since then. The patient presented to ED with abdominal pain for past 4-5 days in the epigastric area associated with nausea and vomiting. CT of the abdomen and pelvis showed mechanical bowel obstruction with transition point at ventral hernia just above the umbilicus. Surgery was consulted and patient was admitted for further workup.   Assessment & Plan    Principal Problem:   Partial small bowel obstruction secondary to incarcerated ventral hernia - Continue IV fluids and pain control. - NG tube if patient starts having any nausea and vomiting again - Per patient BM today, surgery following, recommended advance diet to clear liquids, conservative management  Active Problems:   Diabetes mellitus with hyperglycemia (HCC) - Hold metformin, continue sliding scale insulin q4hrs     History of pulmonary embolism - hold eliquis - Continue heparin IV per pharmacy  Morbid obesity Counseled the patient on diet and weight control  Code Status: full  DVT Prophylaxis: Heparin drip Family Communication: Discussed in detail with the patient, all imaging results, lab results explained to the patient   Disposition Plan:  Time Spent in minutes  25 minutes  Procedures:    Consultants:   Gen. surgery  Antimicrobials:      Medications  Scheduled Meds: . insulin aspart  0-9 Units  Subcutaneous Q4H   Continuous Infusions: . sodium chloride 125 mL/hr at 07/17/16 0505  . heparin 2,600 Units/hr (07/17/16 0505)   PRN Meds:.ondansetron   Antibiotics   Anti-infectives    None        Subjective:   [name] was seen and examined today. BM today, no nausea or vomiting, abdominal pain is improving. No fevers or chills. Patient denies dizziness, chest pain, shortness of breath,  new weakness, numbess, tingling. No acute events overnight.    Objective:   Vitals:   07/16/16 1306 07/16/16 2203 07/16/16 2231 07/17/16 0456  BP: 131/76 (!) 143/71  (!) 150/81  Pulse: 79 75 81 76  Resp: 17 18 18 18   Temp: 98.2 F (36.8 C) 97.9 F (36.6 C)  97.7 F (36.5 C)  TempSrc: Oral Oral  Oral  SpO2: 96% 95% 96% 98%  Weight:      Height:        Intake/Output Summary (Last 24 hours) at 07/17/16 1208 Last data filed at 07/17/16 1106  Gross per 24 hour  Intake          2939.55 ml  Output             2325 ml  Net           614.55 ml     Wt Readings from Last 3 Encounters:  07/16/16 (!) 154.9 kg (341 lb 8 oz)  05/09/16 (!) 162.6 kg (358 lb 6.4 oz)  04/16/16 (!) 163 kg (359 lb 6.4 oz)     Exam  General: Alert and oriented x 3, NAD  HEENT:   Neck: Supple, no JVD  Cardiovascular: S1 S2 auscultated, no rubs, murmurs or gallops. Regular rate and rhythm.  Respiratory: Clear to auscultation bilaterally, no wheezing, rales or rhonchi  Gastrointestinal: Obese, Soft, NT, NBS   Ext: no cyanosis clubbing or edema  Neuro: no new deficits  Skin: No rashes  Psych: Normal affect and demeanor, alert and oriented x3    Data Reviewed:  I have personally reviewed following labs and imaging studies  Micro Results No results found for this or any previous visit (from the past 240 hour(s)).  Radiology Reports Ct Abdomen Pelvis W Contrast  Result Date: 07/15/2016 CLINICAL DATA:  Abdominal pain with vomiting EXAM: CT ABDOMEN AND PELVIS WITH CONTRAST TECHNIQUE:  Multidetector CT imaging of the abdomen and pelvis was performed using the standard protocol following bolus administration of intravenous contrast. CONTRAST:  ISOVUE-300 IOPAMIDOL (ISOVUE-300) INJECTION 61% COMPARISON:  09/06/2015, 12/10/2013 FINDINGS: Lower chest: Lung bases demonstrate no acute consolidation or pleural effusion. Coronary artery calcifications. Normal heart size. Hepatobiliary: Hepatic steatosis. No focal hepatic abnormality. No calcified gallstones. No biliary dilatation. Pancreas: Unremarkable. No pancreatic ductal dilatation or surrounding inflammatory changes. Spleen: Normal in size without focal abnormality. Adrenals/Urinary Tract: Adrenal glands within normal limits. There are multiple calcified stones within the bilateral kidneys. Largest on the right is seen within the mid to lower pole and measures 12 mm. There are adjacent stones in the upper pole left kidney measuring up to 3 mm. Stable 19 mm exophytic hypodense lesion posterior aspect of the mid right kidney. There are small calcified stones within the posterior aspect of the bladder. Stomach/Bowel: Stomach is nonenlarged. Multiple dilated loops of small bowel measuring up to 4.5 cm. Transition zone is at a ventral hernia just above the umbilicus. Appendix normal. Scattered colon diverticula without acute inflammation. Vascular/Lymphatic: No significant vascular findings are present. No enlarged abdominal or pelvic lymph nodes. Reproductive: Few prostate gland calcifications.  No obvious mass Other: No free air or free fluid. Musculoskeletal: Degenerative changes. No acute or suspicious bone lesion. IMPRESSION: 1. Findings compatible with mechanical bowel obstruction with transition point visualized at a ventral hernia just above the umbilicus. 2. Multiple intrarenal calculi. No hydronephrosis. Small stones are also present within the bladder. 3. Hepatic steatosis Electronically Signed   By: Jasmine Pang M.D.   On: 07/15/2016  22:57    Lab Data:  CBC:  Recent Labs Lab 07/15/16 1638  WBC 10.6*  NEUTROABS 7.5  HGB 15.8  HCT 47.9  MCV 89.9  PLT 265   Basic Metabolic Panel:  Recent Labs Lab 07/15/16 1638  NA 143  K 4.2  CL 104  CO2 25  GLUCOSE 153*  BUN 15  CREATININE 1.07  CALCIUM 9.9   GFR: Estimated Creatinine Clearance: 119.9 mL/min (by C-G formula based on SCr of 1.07 mg/dL). Liver Function Tests:  Recent Labs Lab 07/15/16 1638  AST 34  ALT 35  ALKPHOS 75  BILITOT 1.1  PROT 8.4*  ALBUMIN 3.9    Recent Labs Lab 07/15/16 1638  LIPASE 23   No results for input(s): AMMONIA in the last 168 hours. Coagulation Profile: No results for input(s): INR, PROTIME in the last 168 hours. Cardiac Enzymes:  Recent Labs Lab 07/15/16 1638  TROPONINI 0.04*   BNP (last 3 results) No results for input(s): PROBNP in the last 8760 hours. HbA1C: No results for input(s): HGBA1C in the last 72 hours. CBG:  Recent Labs Lab 07/16/16 2015 07/17/16 0007 07/17/16 0458 07/17/16 0742 07/17/16 1207  GLUCAP 91 88 107* 115* 135*   Lipid Profile: No results for input(s): CHOL, HDL, LDLCALC, TRIG, CHOLHDL, LDLDIRECT in the last 72 hours. Thyroid Function Tests: No results for input(s): TSH, T4TOTAL, FREET4, T3FREE, THYROIDAB in the last 72 hours. Anemia Panel: No results for input(s): VITAMINB12, FOLATE, FERRITIN, TIBC, IRON, RETICCTPCT in the last 72 hours. Urine analysis:    Component Value Date/Time   COLORURINE AMBER (A) 07/15/2016 1928   APPEARANCEUR HAZY (A) 07/15/2016 1928   LABSPEC 1.021 07/15/2016 1928   PHURINE 5.0 07/15/2016 1928   GLUCOSEU NEGATIVE 07/15/2016 1928   HGBUR LARGE (A) 07/15/2016 1928   BILIRUBINUR NEGATIVE 07/15/2016 1928   KETONESUR NEGATIVE 07/15/2016 1928   PROTEINUR 30 (A) 07/15/2016 1928   UROBILINOGEN 1.0 12/19/2013 1605   NITRITE NEGATIVE 07/15/2016 1928   LEUKOCYTESUR NEGATIVE 07/15/2016 1928     Pattricia Weiher M.D. [facility] 07/17/2016,  12:08 PM  Pager: [phone] Between 7am to 7pm - call Pager - [phone]  After 7pm go to [email or web address] - password [email or web address]  Call night coverage person covering after 7pm

## 2016-07-18 ENCOUNTER — Ambulatory Visit: Payer: PRIVATE HEALTH INSURANCE | Admitting: Nurse Practitioner

## 2016-07-18 ENCOUNTER — Other Ambulatory Visit: Payer: PRIVATE HEALTH INSURANCE

## 2016-07-18 LAB — GLUCOSE, CAPILLARY
Glucose-Capillary: 112 mg/dL — ABNORMAL HIGH (ref 65–99)
Glucose-Capillary: 113 mg/dL — ABNORMAL HIGH (ref 65–99)
Glucose-Capillary: 136 mg/dL — ABNORMAL HIGH (ref 65–99)
Glucose-Capillary: 90 mg/dL (ref 65–99)
Glucose-Capillary: 92 mg/dL (ref 65–99)
Glucose-Capillary: 98 mg/dL (ref 65–99)

## 2016-07-18 LAB — BASIC METABOLIC PANEL
Anion gap: 8 (ref 5–15)
BUN: 6 mg/dL (ref 6–20)
CO2: 26 mmol/L (ref 22–32)
Calcium: 8.7 mg/dL — ABNORMAL LOW (ref 8.9–10.3)
Chloride: 106 mmol/L (ref 101–111)
Creatinine, Ser: 0.87 mg/dL (ref 0.61–1.24)
GFR calc Af Amer: >60 mL/min (ref >60)
GFR calc non Af Amer: >60 mL/min (ref >60)
Glucose, Bld: 108 mg/dL — ABNORMAL HIGH (ref 65–99)
Potassium: 3.5 mmol/L (ref 3.5–5.1)
Sodium: 140 mmol/L (ref 135–145)

## 2016-07-18 LAB — CBC
HCT: 38.3 % — ABNORMAL LOW (ref 39.0–52.0)
Hemoglobin: 12.3 g/dL — ABNORMAL LOW (ref 13.0–17.0)
MCH: 28.6 pg (ref 26.0–34.0)
MCHC: 32.1 g/dL (ref 30.0–36.0)
MCV: 89.1 fL (ref 78.0–100.0)
Platelets: 211 10*3/uL (ref 150–400)
RBC: 4.3 MIL/uL (ref 4.22–5.81)
RDW: 14.4 % (ref 11.5–15.5)
WBC: 10.6 10*3/uL — ABNORMAL HIGH (ref 4.0–10.5)

## 2016-07-18 LAB — HEPARIN LEVEL (UNFRACTIONATED): Heparin Unfractionated: 0.3 IU/mL (ref 0.30–0.70)

## 2016-07-18 NOTE — Progress Notes (Signed)
Visit made to patients room to place on Cpap. Pt wanted me to set cpap up to wear he can put it on when he is ready.  Advised patient if he has any questions or needs help to have RN call RT.

## 2016-07-18 NOTE — Progress Notes (Signed)
Patient ID: Evan Leonard, male   DOB: 1960/11/24, 56 y.o.   MRN: [id number]  [facility] Surgery Progress Note     Subjective: No complaints this morning. Tolerated clear liquids. Denies any current abdominal pain, nausea, or vomiting. Passing flatus and has had 2 BM's since yesterday.  Objective: Vital signs in last 24 hours: Temp:  [98 F (36.7 C)-98.5 F (36.9 C)] 98.1 F (36.7 C) (02/22 1026) Pulse Rate:  [73-79] 74 (02/22 1026) Resp:  [17-18] 18 (02/22 1026) BP: (131-146)/(76-89) 141/89 (02/22 1026) SpO2:  [94 %-99 %] 94 % (02/22 1026) Last BM Date: 07/17/16  Intake/Output from previous day: 02/21 0701 - 02/22 0700 In: 4062.4 [P.O.:300; I.V.:3762.4] Out: 2975 [Urine:2975] Intake/Output this shift: Total I/O In: -  Out: 600 [Urine:600]  PE: Gen:  Alert, NAD, pleasant Card:  RRR, no M/G/R heard Pulm:  CTAB, no W/R/R Abd: well healed transverse lower abdominal incision, obese, soft, NT/ND, +BS, known ventral hernia not palpable Ext:  No erythema, edema, or tenderness   Lab Results:   Recent Labs  07/15/16 1638 07/18/16 0504  WBC 10.6* 10.6*  HGB 15.8 12.3*  HCT 47.9 38.3*  PLT 265 211   BMET  Recent Labs  07/15/16 1638 07/18/16 0504  NA 143 140  K 4.2 3.5  CL 104 106  CO2 25 26  GLUCOSE 153* 108*  BUN 15 6  CREATININE 1.07 0.87  CALCIUM 9.9 8.7*   PT/INR No results for input(s): LABPROT, INR in the last 72 hours. CMP     Component Value Date/Time   NA 140 07/18/2016 0504   K 3.5 07/18/2016 0504   CL 106 07/18/2016 0504   CO2 26 07/18/2016 0504   GLUCOSE 108 (H) 07/18/2016 0504   BUN 6 07/18/2016 0504   CREATININE 0.87 07/18/2016 0504   CALCIUM 8.7 (L) 07/18/2016 0504   PROT 8.4 (H) 07/15/2016 1638   ALBUMIN 3.9 07/15/2016 1638   AST 34 07/15/2016 1638   ALT 35 07/15/2016 1638   ALKPHOS 75 07/15/2016 1638   BILITOT 1.1 07/15/2016 1638   GFRNONAA >60 07/18/2016 0504   GFRAA >60 07/18/2016 0504   Lipase     Component Value  Date/Time   LIPASE 23 07/15/2016 1638       Studies/Results: No results found.  Anti-infectives: Anti-infectives    None       Assessment/Plan Incarcerated recurrent ventral hernia - CT scan findings compatible with mechanical bowel obstruction with transition point visualized at the ventral hernia just above the umbilicus - No peritonitis or sepsis - Primary repair of incarcerated umbilical hernia in [id number] - Ideally anelective procedure would be preferred. if patient does not improve then surgery will be considered. Would ideally like to see weight loss prior to repair - Heparin per primary, continue to hold Eliquis  ID - none FEN - IVF, full liquids VTE - heparin, holding eliquis  Plan - advance to full liquids for lunch. Depending on how he is doing may advance to soft diet for dinner and d/c tomorrow. Will need OP follow-up with Dr. Hulen Skains to discuss elective repair of hernia.  Addendum: Rechecked patient this afternoon. Tolerating full liquids. Will advance to soft diet and hopefully d/c tomorrow.   LOS: 2 days    Jerrye Beavers , [facility] Surgery 07/18/2016, 10:33 AM Pager: [phone] Consults: [phone] Mon-Fri 7:00 am-4:30 pm Sat-Sun 7:00 am-11:30 am

## 2016-07-18 NOTE — Plan of Care (Signed)
Problem: Food- and Nutrition-Related Knowledge Deficit (NB-1.1) Goal: Nutrition education Formal process to instruct or train a patient/client in a skill or to impart knowledge to help patients/clients voluntarily manage or modify food choices and eating behavior to maintain or improve health. Outcome: Completed/Met Date Met: 07/18/16  RD consulted for nutrition education regarding weight loss.  Body mass index is 43.85 kg/m. Pt meets criteria for morbid obesity based on current BMI.  RD provided "Weight Loss Tips" as well as "General, Healthful Nutrition Therapy" handout from the [employer] of [employer]. Pt reports previous knowledge on a healthy diet. Pt reports usually cooking at of his meals. Emphasized the importance of serving sizes and provided examples of correct portions of common foods. Discussed importance of controlled and consistent intake throughout the day. Provided examples of ways to balance meals/snacks and encouraged intake of high-fiber, whole grain complex carbohydrates. Emphasized the importance of hydration with calorie-free beverages and limiting sugar-sweetened beverages. Encouraged pt to discuss physical activity options with physician. Teach back method used.  Expect good compliance.  Current diet order is full liquid, patient is consuming approximately 100% of meals at this time. Possible plans for diet to be advanced to a soft diet at dinner and it pt able to tolerate it then pt may discharge home. Labs and medications reviewed. No further nutrition interventions warranted at this time. RD contact information provided. If additional nutrition issues arise, please re-consult RD.  Corrin Parker, MS, RD, LDN Pager # [phone] After hours/ weekend pager # [phone]

## 2016-07-18 NOTE — Progress Notes (Signed)
[facility]                                                                              Patient Demographics  Evan Leonard, is a 56 y.o. male, DOB - 22-Aug-1960, JYN:[id number]  Admit date - 07/15/2016   Admitting Physician Hillary Bow, DO  Outpatient Primary MD for the patient is Tana Conch, MD  Outpatient specialists:   LOS - 2  days    Chief Complaint  Patient presents with  . Abdominal Pain  . Emesis       Brief summary   Patient is a 56 year old male with diabetes on metformin, prior SBO in April of last year due to incarcerated ventral hernia, no recurrence since then. The patient presented to ED with abdominal pain for past 4-5 days in the epigastric area associated with nausea and vomiting. CT of the abdomen and pelvis showed mechanical bowel obstruction with transition point at ventral hernia just above the umbilicus. Surgery was consulted and patient was admitted for further workup.   Assessment & Plan    Principal Problem:   Partial small bowel obstruction secondary to incarcerated ventral hernia - Continue IV fluids and pain control.  - Per patient he tolerated clear liquid diet well and had 2 BMs - Gen. surgery following, likely to advance diet. Recommending conservative management.  Active Problems:   Diabetes mellitus with hyperglycemia (HCC) - Hold metformin, continue sliding scale insulin q4hrs     History of pulmonary embolism - hold eliquis - Continue heparin IV per pharmacy  Morbid obesity - Counseled the patient on diet and weight control - Nutrition consult placed for diet education to hopefully lose 20 pounds for elective surgery  Code Status: full  DVT Prophylaxis: Heparin drip Family Communication: Discussed in detail with the patient, all imaging results, lab results explained to the patient   Disposition Plan: Hopefully DC home in a.m. if continues to medically improve Time Spent in minutes  25  minutes  Procedures:    Consultants:   Gen. surgery  Antimicrobials:      Medications  Scheduled Meds: . insulin aspart  0-9 Units Subcutaneous Q4H   Continuous Infusions: . sodium chloride 75 mL/hr at 07/18/16 1100  . heparin 2,600 Units/hr (07/18/16 0111)   PRN Meds:.ondansetron   Antibiotics   Anti-infectives    None        Subjective:   Evan Leonard was seen and examined today. Tolerating full liquid diet, states had 2 BMs yesterday. No nausea or vomiting or abdominal pain.  No fevers or chills. Patient denies dizziness, chest pain, shortness of breath,  new weakness, numbess, tingling. No acute events overnight.    Objective:   Vitals:   07/17/16 1409 07/17/16 2107 07/18/16 0515 07/18/16 1026  BP: 131/76 131/79 (!) 146/82 (!) 141/89  Pulse: 76 73 79 74  Resp: 18 17 17 18   Temp: 98 F (36.7 C) 98.5 F (36.9 C) 98.3 F (36.8 C) 98.1 F (36.7 C)  TempSrc: Oral Oral Oral Oral  SpO2: 99% 97% 99% 94%  Weight:      Height:  Intake/Output Summary (Last 24 hours) at 07/18/16 1255 Last data filed at 07/18/16 1200  Gross per 24 hour  Intake          4362.43 ml  Output             3750 ml  Net           612.43 ml     Wt Readings from Last 3 Encounters:  07/16/16 (!) 154.9 kg (341 lb 8 oz)  05/09/16 (!) 162.6 kg (358 lb 6.4 oz)  04/16/16 (!) 163 kg (359 lb 6.4 oz)     Exam  General: Alert and oriented x 3, NAD  HEENT:   Neck: Supple, no JVD  Cardiovascular: S1 S2 clear, RRR  Respiratory: Clear to auscultation bilaterally, no wheezing, rales or rhonchi  Gastrointestinal: Obese, Soft, NT, NBS   Ext: no cyanosis clubbing or edema  Neuro: no new deficits  Skin: No rashes  Psych: Normal affect and demeanor, alert and oriented x3    Data Reviewed:  I have personally reviewed following labs and imaging studies  Micro Results No results found for this or any previous visit (from the past 240 hour(s)).  Radiology Reports Ct  Abdomen Pelvis W Contrast  Result Date: 07/15/2016 CLINICAL DATA:  Abdominal pain with vomiting EXAM: CT ABDOMEN AND PELVIS WITH CONTRAST TECHNIQUE: Multidetector CT imaging of the abdomen and pelvis was performed using the standard protocol following bolus administration of intravenous contrast. CONTRAST:  ISOVUE-300 IOPAMIDOL (ISOVUE-300) INJECTION 61% COMPARISON:  09/06/2015, 12/10/2013 FINDINGS: Lower chest: Lung bases demonstrate no acute consolidation or pleural effusion. Coronary artery calcifications. Normal heart size. Hepatobiliary: Hepatic steatosis. No focal hepatic abnormality. No calcified gallstones. No biliary dilatation. Pancreas: Unremarkable. No pancreatic ductal dilatation or surrounding inflammatory changes. Spleen: Normal in size without focal abnormality. Adrenals/Urinary Tract: Adrenal glands within normal limits. There are multiple calcified stones within the bilateral kidneys. Largest on the right is seen within the mid to lower pole and measures 12 mm. There are adjacent stones in the upper pole left kidney measuring up to 3 mm. Stable 19 mm exophytic hypodense lesion posterior aspect of the mid right kidney. There are small calcified stones within the posterior aspect of the bladder. Stomach/Bowel: Stomach is nonenlarged. Multiple dilated loops of small bowel measuring up to 4.5 cm. Transition zone is at a ventral hernia just above the umbilicus. Appendix normal. Scattered colon diverticula without acute inflammation. Vascular/Lymphatic: No significant vascular findings are present. No enlarged abdominal or pelvic lymph nodes. Reproductive: Few prostate gland calcifications.  No obvious mass Other: No free air or free fluid. Musculoskeletal: Degenerative changes. No acute or suspicious bone lesion. IMPRESSION: 1. Findings compatible with mechanical bowel obstruction with transition point visualized at a ventral hernia just above the umbilicus. 2. Multiple intrarenal calculi. No  hydronephrosis. Small stones are also present within the bladder. 3. Hepatic steatosis Electronically Signed   By: Jasmine Pang M.D.   On: 07/15/2016 22:57    Lab Data:  CBC:  Recent Labs Lab 07/15/16 1638 07/18/16 0504  WBC 10.6* 10.6*  NEUTROABS 7.5  --   HGB 15.8 12.3*  HCT 47.9 38.3*  MCV 89.9 89.1  PLT 265 211   Basic Metabolic Panel:  Recent Labs Lab 07/15/16 1638 07/18/16 0504  NA 143 140  K 4.2 3.5  CL 104 106  CO2 25 26  GLUCOSE 153* 108*  BUN 15 6  CREATININE 1.07 0.87  CALCIUM 9.9 8.7*   GFR: Estimated Creatinine Clearance: 147.5  mL/min (by C-G formula based on SCr of 0.87 mg/dL). Liver Function Tests:  Recent Labs Lab 07/15/16 1638  AST 34  ALT 35  ALKPHOS 75  BILITOT 1.1  PROT 8.4*  ALBUMIN 3.9    Recent Labs Lab 07/15/16 1638  LIPASE 23   No results for input(s): AMMONIA in the last 168 hours. Coagulation Profile: No results for input(s): INR, PROTIME in the last 168 hours. Cardiac Enzymes:  Recent Labs Lab 07/15/16 1638  TROPONINI 0.04*   BNP (last 3 results) No results for input(s): PROBNP in the last 8760 hours. HbA1C: No results for input(s): HGBA1C in the last 72 hours. CBG:  Recent Labs Lab 07/17/16 2006 07/18/16 0012 07/18/16 0410 07/18/16 0735 07/18/16 1128  GLUCAP 97 90 98 113* 136*   Lipid Profile: No results for input(s): CHOL, HDL, LDLCALC, TRIG, CHOLHDL, LDLDIRECT in the last 72 hours. Thyroid Function Tests: No results for input(s): TSH, T4TOTAL, FREET4, T3FREE, THYROIDAB in the last 72 hours. Anemia Panel: No results for input(s): VITAMINB12, FOLATE, FERRITIN, TIBC, IRON, RETICCTPCT in the last 72 hours. Urine analysis:    Component Value Date/Time   COLORURINE AMBER (A) 07/15/2016 1928   APPEARANCEUR HAZY (A) 07/15/2016 1928   LABSPEC 1.021 07/15/2016 1928   PHURINE 5.0 07/15/2016 1928   GLUCOSEU NEGATIVE 07/15/2016 1928   HGBUR LARGE (A) 07/15/2016 1928   BILIRUBINUR NEGATIVE 07/15/2016 1928     KETONESUR NEGATIVE 07/15/2016 1928   PROTEINUR 30 (A) 07/15/2016 1928   UROBILINOGEN 1.0 12/19/2013 1605   NITRITE NEGATIVE 07/15/2016 1928   LEUKOCYTESUR NEGATIVE 07/15/2016 1928     Carsyn Boster M.D. [facility] 07/18/2016, 12:55 PM  Pager: [phone] Between 7am to 7pm - call Pager - [phone]  After 7pm go to [email or web address] - password [email or web address]  Call night coverage person covering after 7pm

## 2016-07-18 NOTE — Progress Notes (Signed)
ANTICOAGULATION CONSULT NOTE - Follow-up Consult  Pharmacy Consult for Heparin (Apixaban on hold) Indication: History of DVT/PE  No Known Allergies  Patient Measurements: Height: 6\' 2"  (188 cm) Weight: (!) 341 lb 8 oz (154.9 kg) IBW/kg (Calculated) : 82.2  Heparin dosing wt: 122 kg  Vital Signs: Temp: 98.3 F (36.8 C) (02/22 0515) Temp Source: Oral (02/22 0515) BP: 146/82 (02/22 0515) Pulse Rate: 79 (02/22 0515)  Labs:  Recent Labs  07/15/16 1638  07/16/16 2209 07/17/16 0537 07/17/16 1123 07/18/16 0504  HGB 15.8  --   --   --   --  12.3*  HCT 47.9  --   --   --   --  38.3*  PLT 265  --   --   --   --  211  APTT  --   < > 56* 91* 91*  --   HEPARINUNFRC  --   < >  --  0.58 0.65 0.30  CREATININE 1.07  --   --   --   --  0.87  TROPONINI 0.04*  --   --   --   --   --   < > = values in this interval not displayed.  Estimated Creatinine Clearance: 147.5 mL/min (by C-G formula based on SCr of 0.87 mg/dL).  Assessment: 56 y/o M on Eliquis 5mg  PO BID PTA for hx of DVT / PE. Currently holding and transitioned to heparin gtt in case there is a need for surgical intervention. Last heparin level remains therapeutic at 0.3. Hgb down to 12.3, plts wnl. No s/s of bleed.  Goal of Therapy:  Heparin level 0.3-0.7 Monitor platelets by anticoagulation protocol: Yes   Plan:  Continue heparin gtt at 2,600 units/hr Monitor daily heparin level, CBC, s/s of bleed  [name], PharmD, [facility] Clinical Pharmacist Pager [phone] 07/18/2016 7:29 AM

## 2016-07-19 LAB — BASIC METABOLIC PANEL
Anion gap: 8 (ref 5–15)
BUN: 5 mg/dL — ABNORMAL LOW (ref 6–20)
CO2: 25 mmol/L (ref 22–32)
Calcium: 8.8 mg/dL — ABNORMAL LOW (ref 8.9–10.3)
Chloride: 104 mmol/L (ref 101–111)
Creatinine, Ser: 0.87 mg/dL (ref 0.61–1.24)
GFR calc Af Amer: >60 mL/min (ref >60)
GFR calc non Af Amer: >60 mL/min (ref >60)
Glucose, Bld: 132 mg/dL — ABNORMAL HIGH (ref 65–99)
Potassium: 3.4 mmol/L — ABNORMAL LOW (ref 3.5–5.1)
Sodium: 137 mmol/L (ref 135–145)

## 2016-07-19 LAB — CBC
HCT: 40.6 % (ref 39.0–52.0)
Hemoglobin: 13.4 g/dL (ref 13.0–17.0)
MCH: 29.1 pg (ref 26.0–34.0)
MCHC: 33 g/dL (ref 30.0–36.0)
MCV: 88.3 fL (ref 78.0–100.0)
Platelets: 232 10*3/uL (ref 150–400)
RBC: 4.6 MIL/uL (ref 4.22–5.81)
RDW: 14.3 % (ref 11.5–15.5)
WBC: 11.1 10*3/uL — ABNORMAL HIGH (ref 4.0–10.5)

## 2016-07-19 LAB — GLUCOSE, CAPILLARY
Glucose-Capillary: 104 mg/dL — ABNORMAL HIGH (ref 65–99)
Glucose-Capillary: 113 mg/dL — ABNORMAL HIGH (ref 65–99)
Glucose-Capillary: 116 mg/dL — ABNORMAL HIGH (ref 65–99)
Glucose-Capillary: 120 mg/dL — ABNORMAL HIGH (ref 65–99)
Glucose-Capillary: 90 mg/dL (ref 65–99)

## 2016-07-19 LAB — HEPARIN LEVEL (UNFRACTIONATED): Heparin Unfractionated: 0.3 IU/mL (ref 0.30–0.70)

## 2016-07-19 MED ORDER — SENNOSIDES-DOCUSATE SODIUM 8.6-50 MG PO TABS: 1.0000 | ORAL_TABLET | Freq: Every evening | ORAL | 0 refills | Status: DC | PRN

## 2016-07-19 MED ORDER — APIXABAN 5 MG PO TABS
5.0000 mg | ORAL_TABLET | Freq: Two times a day (BID) | ORAL | Status: DC
  Administered 2016-07-19: 5 mg via ORAL
  Filled 2016-07-19: qty 1

## 2016-07-19 MED ORDER — ONDANSETRON 4 MG PO TBDP: 4.0000 mg | ORAL_TABLET | Freq: Three times a day (TID) | ORAL | 0 refills | Status: DC | PRN

## 2016-07-19 MED ORDER — FUROSEMIDE 40 MG PO TABS
40.0000 mg | ORAL_TABLET | Freq: Every day | ORAL | Status: DC
  Administered 2016-07-19: 40 mg via ORAL
  Filled 2016-07-19: qty 1

## 2016-07-19 MED ORDER — ADULT MULTIVITAMIN W/MINERALS CH
1.0000 | ORAL_TABLET | Freq: Every day | ORAL | Status: DC
  Administered 2016-07-19: 1 via ORAL
  Filled 2016-07-19: qty 1

## 2016-07-19 NOTE — Discharge Summary (Signed)
Physician Discharge Summary   Patient ID: Evan Leonard MRN: [id number] DOB/AGE: 12/14/1960 56 y.o.  Admit date: 07/15/2016 Discharge date: 07/19/2016  Primary Care Physician:  Tana Conch, MD  Discharge Diagnoses:    . Partial small bowel obstructions can do to incarcerated ventral hernia  . Diabetes mellitus with hyperglycemia (HCC) . History of pulmonary embolism    Morbid obesity   Consults:  Gen. surgery  Recommendations for Outpatient Follow-up:  1. Please repeat CBC/BMET at next visit   DIET: Carb modified diet    Allergies:  No Known Allergies   DISCHARGE MEDICATIONS: Current Discharge Medication List    START taking these medications   Details  ondansetron (ZOFRAN ODT) 4 MG disintegrating tablet Take 1 tablet (4 mg total) by mouth every 8 (eight) hours as needed for nausea or vomiting. Qty: 20 tablet, Refills: 0    senna-docusate (SENOKOT S) 8.6-50 MG tablet Take 1 tablet by mouth at bedtime as needed for mild constipation. Also available OTC Qty: 30 tablet, Refills: 0      CONTINUE these medications which have NOT CHANGED   Details  ELIQUIS 5 MG TABS tablet TAKE 1 TABLET TWICE A DAY Qty: 60 tablet, Refills: 1    furosemide (LASIX) 40 MG tablet Take 1 tablet (40 mg total) by mouth daily. Qty: 30 tablet, Refills: 5    metFORMIN (GLUCOPHAGE) 500 MG tablet TAKE 2 TABLETS (1,000 MG TOTAL) BY MOUTH 2 (TWO) TIMES DAILY WITH A MEAL. Qty: 120 tablet, Refills: 1    Multiple Vitamin (MULTIVITAMIN WITH MINERALS) TABS tablet Take 1 tablet by mouth daily.     OVER THE COUNTER MEDICATION Apply 1 application topically daily. Medication: Back Balm. Patient states he apply to his legs every day.         Brief H and P: For complete details please refer to admission H and P, but in brief Patient is a 56 year old male with diabetes on metformin, prior SBO in April of last year due to incarcerated ventral hernia, no recurrence since then. The patient presented to  ED with abdominal pain for past 4-5 days in the epigastric area associated with nausea and vomiting. CT of the abdomen and pelvis showed mechanical bowel obstruction with transition point at ventral hernia just above the umbilicus. Surgery was consulted and patient was admitted for further workup.   Hospital Course:   Partial small bowel obstruction secondary to incarcerated ventral hernia - Patient was placed on IV fluids, NPO status and pain control. General surgery was consulted. He was recommended conservative management and elective ventral hernia surgery. - Patient did well with conservative management and slowly the diet was advanced. He is currently tolerating solid diet. - Gen. surgery recommended losing about 20 pounds before the elective ventral hernia surgery. Dietitian consult was obtained for the patient.     Diabetes mellitus with hyperglycemia (HCC) - Resume metformin and continue home regimen     History of pulmonary embolism - Eliquis was held and patient was placed on IV heparin in case he needs any surgery. Eliquis is now resumed.    Morbid obesity - BMI 47.6 - Counseled the patient on diet and weight control - Nutrition consult placed for diet education to hopefully lose 20 pounds for elective surgery   Day of Discharge BP 133/82 (BP Location: Left Arm)   Pulse 82   Temp 98.2 F (36.8 C) (Oral)   Resp 18   Ht 6\' 2"  (1.88 m)   Wt (!) 154.9 kg (341 lb  8 oz)   SpO2 98%   BMI 43.85 kg/m   Physical Exam: General: Alert and awake oriented x3 not in any acute distress. HEENT: anicteric sclera, pupils reactive to light and accommodation CVS: S1-S2 clear no murmur rubs or gallops Chest: clear to auscultation bilaterally, no wheezing rales or rhonchi Abdomen:  morbid obesity soft nontender, nondistended, normal bowel sounds Extremities: no cyanosis, clubbing or edema noted bilaterally Neuro: Cranial nerves II-XII intact, no focal neurological  deficits   The results of significant diagnostics from this hospitalization (including imaging, microbiology, ancillary and laboratory) are listed below for reference.    LAB RESULTS: Basic Metabolic Panel:  Recent Labs Lab 07/18/16 0504 07/19/16 0446  NA 140 137  K 3.5 3.4*  CL 106 104  CO2 26 25  GLUCOSE 108* 132*  BUN 6 5*  CREATININE 0.87 0.87  CALCIUM 8.7* 8.8*   Liver Function Tests:  Recent Labs Lab 07/15/16 1638  AST 34  ALT 35  ALKPHOS 75  BILITOT 1.1  PROT 8.4*  ALBUMIN 3.9    Recent Labs Lab 07/15/16 1638  LIPASE 23   No results for input(s): AMMONIA in the last 168 hours. CBC:  Recent Labs Lab 07/15/16 1638 07/18/16 0504 07/19/16 0446  WBC 10.6* 10.6* 11.1*  NEUTROABS 7.5  --   --   HGB 15.8 12.3* 13.4  HCT 47.9 38.3* 40.6  MCV 89.9 89.1 88.3  PLT 265 211 232   Cardiac Enzymes:  Recent Labs Lab 07/15/16 1638  TROPONINI 0.04*   BNP: Invalid input(s): POCBNP CBG:  Recent Labs Lab 07/19/16 0745 07/19/16 1230  GLUCAP 104* 116*    Significant Diagnostic Studies:  Ct Abdomen Pelvis W Contrast  Result Date: 07/15/2016 CLINICAL DATA:  Abdominal pain with vomiting EXAM: CT ABDOMEN AND PELVIS WITH CONTRAST TECHNIQUE: Multidetector CT imaging of the abdomen and pelvis was performed using the standard protocol following bolus administration of intravenous contrast. CONTRAST:  ISOVUE-300 IOPAMIDOL (ISOVUE-300) INJECTION 61% COMPARISON:  09/06/2015, 12/10/2013 FINDINGS: Lower chest: Lung bases demonstrate no acute consolidation or pleural effusion. Coronary artery calcifications. Normal heart size. Hepatobiliary: Hepatic steatosis. No focal hepatic abnormality. No calcified gallstones. No biliary dilatation. Pancreas: Unremarkable. No pancreatic ductal dilatation or surrounding inflammatory changes. Spleen: Normal in size without focal abnormality. Adrenals/Urinary Tract: Adrenal glands within normal limits. There are multiple calcified  stones within the bilateral kidneys. Largest on the right is seen within the mid to lower pole and measures 12 mm. There are adjacent stones in the upper pole left kidney measuring up to 3 mm. Stable 19 mm exophytic hypodense lesion posterior aspect of the mid right kidney. There are small calcified stones within the posterior aspect of the bladder. Stomach/Bowel: Stomach is nonenlarged. Multiple dilated loops of small bowel measuring up to 4.5 cm. Transition zone is at a ventral hernia just above the umbilicus. Appendix normal. Scattered colon diverticula without acute inflammation. Vascular/Lymphatic: No significant vascular findings are present. No enlarged abdominal or pelvic lymph nodes. Reproductive: Few prostate gland calcifications.  No obvious mass Other: No free air or free fluid. Musculoskeletal: Degenerative changes. No acute or suspicious bone lesion. IMPRESSION: 1. Findings compatible with mechanical bowel obstruction with transition point visualized at a ventral hernia just above the umbilicus. 2. Multiple intrarenal calculi. No hydronephrosis. Small stones are also present within the bladder. 3. Hepatic steatosis Electronically Signed   By: Jasmine Pang M.D.   On: 07/15/2016 22:57    2D ECHO:   Disposition and Follow-up: Discharge Instructions  Diet Carb Modified    Complete by:  As directed    Increase activity slowly    Complete by:  As directed        DISPOSITION: home    DISCHARGE FOLLOW-UP Follow-up Information    Jimmye Norman, MD. Schedule an appointment as soon as possible for a visit in 1 month(s).   Specialty:  General Surgery Why:  Please call to make an appointment for 1-2 months to discuss surgery Contact information: [address] [place] [address] [phone]        Tana Conch, MD. Schedule an appointment as soon as possible for a visit in 2 week(s).   Specialty:  Family Medicine Contact information: [address]  [place] [address] [phone]            Time spent on Discharge:   Signed:   Lennette Fader M.D. [facility] 07/19/2016, 12:45 PM Pager: [phone]

## 2016-07-19 NOTE — Progress Notes (Signed)
Patient ID: Evan Leonard, male   DOB: 1960/10/04, 56 y.o.   MRN: [id number]  [facility] Surgery Progress Note     Subjective: Patient continues to do well. He is tolerating soft diet and having normal BMs. Feels ready to go home. Spoke with dietician and is ready to start working on weight loss.  Objective: Vital signs in last 24 hours: Temp:  [97.5 F (36.4 C)-98.2 F (36.8 C)] 98.2 F (36.8 C) (02/23 0437) Pulse Rate:  [74-82] 82 (02/23 0437) Resp:  [17-18] 18 (02/23 0437) BP: (124-141)/(78-89) 133/82 (02/23 0437) SpO2:  [93 %-98 %] 98 % (02/23 0437) Last BM Date: 07/18/16  Intake/Output from previous day: 02/22 0701 - 02/23 0700 In: 3311 [P.O.:1020; I.V.:2291] Out: 3450 [Urine:3450] Intake/Output this shift: No intake/output data recorded.  PE: Gen:  Alert, NAD, pleasant Card:  RRR, no M/G/R heard Pulm:  CTAB, no W/R/R Abd: well healed transverse lower abdominal incision, obese, soft, NT/ND, +BS, known ventral hernia not palpable Ext:  No erythema, edema, or tenderness    Lab Results:   Recent Labs  07/18/16 0504 07/19/16 0446  WBC 10.6* 11.1*  HGB 12.3* 13.4  HCT 38.3* 40.6  PLT 211 232   BMET  Recent Labs  07/18/16 0504 07/19/16 0446  NA 140 137  K 3.5 3.4*  CL 106 104  CO2 26 25  GLUCOSE 108* 132*  BUN 6 5*  CREATININE 0.87 0.87  CALCIUM 8.7* 8.8*   PT/INR No results for input(s): LABPROT, INR in the last 72 hours. CMP     Component Value Date/Time   NA 137 07/19/2016 0446   K 3.4 (L) 07/19/2016 0446   CL 104 07/19/2016 0446   CO2 25 07/19/2016 0446   GLUCOSE 132 (H) 07/19/2016 0446   BUN 5 (L) 07/19/2016 0446   CREATININE 0.87 07/19/2016 0446   CALCIUM 8.8 (L) 07/19/2016 0446   PROT 8.4 (H) 07/15/2016 1638   ALBUMIN 3.9 07/15/2016 1638   AST 34 07/15/2016 1638   ALT 35 07/15/2016 1638   ALKPHOS 75 07/15/2016 1638   BILITOT 1.1 07/15/2016 1638   GFRNONAA >60 07/19/2016 0446   GFRAA >60 07/19/2016 0446   Lipase      Component Value Date/Time   LIPASE 23 07/15/2016 1638       Studies/Results: No results found.  Anti-infectives: Anti-infectives    None       Assessment/Plan Incarcerated recurrent ventral hernia - CT scan findings compatible with mechanical bowel obstruction with transition point visualized at the ventral hernia just above the umbilicus - No peritonitis or sepsis - Primary repair of incarcerated umbilical hernia in [id number] - Ideally anelective procedure would be preferred. if patient does not improve then surgery will be considered. Would ideally like to see weight loss prior to repair - Heparin per primary, continue to hold Eliquis  ID - none FEN - IVF, soft diet VTE - heparin, holding eliquis  Plan - Patient is ready for discharge from our standpoint. He may restart eliquis. He will follow-up with Dr. Hulen Skains in 1-2 months to discuss elective hernia repair. He will continue working on weight loss.    LOS: 3 days    Jerrye Beavers , [facility] Surgery 07/19/2016, 8:17 AM Pager: [phone] Consults: [phone] Mon-Fri 7:00 am-4:30 pm Sat-Sun 7:00 am-11:30 am

## 2016-07-19 NOTE — Progress Notes (Signed)
ANTICOAGULATION CONSULT NOTE - Follow-up Consult  Pharmacy Consult for Heparin >> Eliquis Indication: History of DVT/PE  No Known Allergies  Patient Measurements: Height: 6\' 2"  (188 cm) Weight: (!) 341 lb 8 oz (154.9 kg) IBW/kg (Calculated) : 82.2  Heparin dosing wt: 122 kg  Vital Signs: Temp: 98.2 F (36.8 C) (02/23 0437) Temp Source: Oral (02/23 0437) BP: 133/82 (02/23 0437) Pulse Rate: 82 (02/23 0437)  Labs:  Recent Labs  07/16/16 2209 07/17/16 0537 07/17/16 1123 07/18/16 0504 07/19/16 0446  HGB  --   --   --  12.3* 13.4  HCT  --   --   --  38.3* 40.6  PLT  --   --   --  211 232  APTT 56* 91* 91*  --   --   HEPARINUNFRC  --  0.58 0.65 0.30 0.30  CREATININE  --   --   --  0.87 0.87    Estimated Creatinine Clearance: 147.5 mL/min (by C-G formula based on SCr of 0.87 mg/dL).  Assessment: 49 YOM on Eliquis 5mg  PO BID PTA for history of DVT/PE.  Patient was transitioned to IV heparin for possible surgical intervention.  Now to restart Eliquis.  No bleeding reported.   Goal of Therapy:  Appropriate anticoagulation    Plan:  - Eliquis 5mg  PO BID, stop IV heparin when Eliquis is administered (RN aware) - Pharmacy will sign off and follow peripherally.  Thank you for the consult!   Britani Beattie D. Mina Marble, PharmD, BCPS Pager:  [phone] 07/19/2016, 9:22 AM

## 2016-07-19 NOTE — Progress Notes (Signed)
Pt discharge to home. Pt belongings with the pt. Discharge instruction has been given by the day rn to the pt. No further questions at this time.

## 2016-07-19 NOTE — Progress Notes (Signed)
Patient discharged to home. Discharge instruction, prescriptions, and work note were given to patient. Patient agreed and verbalized understanding. No further questions at this moment. States his ride will pick him up at 1700.   Vergia Alberts n 07/19/16 12:51 PM

## 2016-07-22 ENCOUNTER — Telehealth: Payer: Self-pay

## 2016-07-22 NOTE — Telephone Encounter (Signed)
LMTCB

## 2016-07-23 NOTE — Telephone Encounter (Signed)
Noted thanks °

## 2016-07-23 NOTE — Telephone Encounter (Signed)
D/C 07/19/26 To: home  Spoke with pt and he states that he is feeling much better. He reports normal bowel movements and no abdominal pain. He has no questions or concerns at this time.  Appt scheduled with Dr. Yong Channel 07/25/16, pt aware.    Transition Care Management Follow-up Telephone Call  How have you been since you were released from the hospital? good   Do you understand why you were in the hospital? yes   Do you understand the discharge instrcutions? yes  Items Reviewed:  Medications reviewed: yes  Allergies reviewed: yes  Dietary changes reviewed: yes  Referrals reviewed: yes   Functional Questionnaire:   Activities of Daily Living (ADLs):   He states they are independent in the following: ambulation, bathing and hygiene, feeding, continence, grooming, toileting and dressing States they require assistance with the following: none   Any transportation issues/concerns?: no   Any patient concerns? no   Confirmed importance and date/time of follow-up visits scheduled: yes   Confirmed with patient if condition begins to worsen call PCP or go to the ER.  Patient was given the Call-a-Nurse line [phone]: yes

## 2016-07-23 NOTE — Telephone Encounter (Signed)
LMTCB

## 2016-07-25 ENCOUNTER — Ambulatory Visit (INDEPENDENT_AMBULATORY_CARE_PROVIDER_SITE_OTHER): Payer: PRIVATE HEALTH INSURANCE | Admitting: Family Medicine

## 2016-07-25 ENCOUNTER — Encounter: Payer: PRIVATE HEALTH INSURANCE | Admitting: Family Medicine

## 2016-07-25 ENCOUNTER — Encounter: Payer: Self-pay | Admitting: Family Medicine

## 2016-07-25 DIAGNOSIS — K5669 Other partial intestinal obstruction: Secondary | ICD-10-CM

## 2016-07-25 DIAGNOSIS — I2699 Other pulmonary embolism without acute cor pulmonale: Secondary | ICD-10-CM

## 2016-07-25 DIAGNOSIS — G4733 Obstructive sleep apnea (adult) (pediatric): Secondary | ICD-10-CM

## 2016-07-25 DIAGNOSIS — E1165 Type 2 diabetes mellitus with hyperglycemia: Secondary | ICD-10-CM

## 2016-07-25 DIAGNOSIS — Z6841 Body Mass Index (BMI) 40.0 and over, adult: Secondary | ICD-10-CM

## 2016-07-25 LAB — COMPREHENSIVE METABOLIC PANEL
ALT: 18 U/L (ref 0–53)
AST: 14 U/L (ref 0–37)
Albumin: 3.5 g/dL (ref 3.5–5.2)
Alkaline Phosphatase: 67 U/L (ref 39–117)
BUN: 10 mg/dL (ref 6–23)
CO2: 28 mEq/L (ref 19–32)
Calcium: 9.2 mg/dL (ref 8.4–10.5)
Chloride: 104 mEq/L (ref 96–112)
Creatinine, Ser: 0.87 mg/dL (ref 0.40–1.50)
GFR: 96.06 mL/min (ref >60.00)
Glucose, Bld: 125 mg/dL — ABNORMAL HIGH (ref 70–99)
Potassium: 4.5 mEq/L (ref 3.5–5.1)
Sodium: 139 mEq/L (ref 135–145)
Total Bilirubin: 0.8 mg/dL (ref 0.2–1.2)
Total Protein: 6.8 g/dL (ref 6.0–8.3)

## 2016-07-25 LAB — CBC WITH DIFFERENTIAL/PLATELET
Basophils Absolute: 0.1 10*3/uL (ref 0.0–0.1)
Basophils Relative: 0.8 % (ref 0.0–3.0)
Eosinophils Absolute: 0.4 10*3/uL (ref 0.0–0.7)
Eosinophils Relative: 3.9 % (ref 0.0–5.0)
HCT: 41.1 % (ref 39.0–52.0)
Hemoglobin: 13.4 g/dL (ref 13.0–17.0)
Lymphocytes Relative: 16.6 % (ref 12.0–46.0)
Lymphs Abs: 1.6 10*3/uL (ref 0.7–4.0)
MCHC: 32.6 g/dL (ref 30.0–36.0)
MCV: 88.6 fl (ref 78.0–100.0)
Monocytes Absolute: 0.7 10*3/uL (ref 0.1–1.0)
Monocytes Relative: 6.9 % (ref 3.0–12.0)
Neutro Abs: 7.1 10*3/uL (ref 1.4–7.7)
Neutrophils Relative %: 71.8 % (ref 43.0–77.0)
Platelets: 270 10*3/uL (ref 150.0–400.0)
RBC: 4.64 Mil/uL (ref 4.22–5.81)
RDW: 14.8 % (ref 11.5–15.5)
WBC: 9.9 10*3/uL (ref 4.0–10.5)

## 2016-07-25 LAB — HEMOGLOBIN A1C: Hgb A1c MFr Bld: 7.2 % — ABNORMAL HIGH (ref 4.6–6.5)

## 2016-07-25 NOTE — Assessment & Plan Note (Signed)
Partial bowel obstruction was due to ventral hernia incarceration. He has done well with conservative management. He is going to focus on losing another 20 pounds then follow-up with general surgery as this is the second small bowel obstruction due to this ventral hernia in the last 18 months.

## 2016-07-25 NOTE — Progress Notes (Signed)
Subjective:  Evan Leonard is a 56 y.o. year old very pleasant male patient who presents for transitional care management and hospital follow up for partial small bowel obstruction due to incacerated ventral hernia. Patient was hospitalized from 07/15/16 to 07/19/16. A TCM phone call was completed on 07/22/16. Medical complexity moderate   Evan Leonard is a 56 year old morbidly obese man with history of ventral hernia who presented to the emergency room with abdominal pain for 4-5 days that was steadily increasing in epigastric area associated with nausea and vomiting and was found to have a partial small bowel obstruction secondary to incarcerated ventral hernia. He had a similar episode in April of last year for the same issue.   He was noted to have an elevated white blood cell count which resolved on CBC today. Patient had a CT scan of the abdomen and pelvis on 07/15/2016. This scan is what revealed a mechanical bowel obstruction-this appeared to start at his ventral hernia site just above the umbilicus. He was also noted at multiple kidney stones. No hydronephrosis fortunately. He also had some stones in his bladder. Of note patient has noted some gross hematuria since being home. He would like a referral to urology to discuss the kidney stones and options. Fortunately he has not been having pain from the kidney stones as far as he knows.   It was also noted that he had hepatic steatosis. We discussed fatty liver today. I advised weight loss. I independently reviewed the CT scan and agree with the findings from radiology. One final note there was also a hypodense lesion in the mid right kidney-would appreciate urology's input on this.  Patient was placed nothing by mouth, placed on IV fluids, and focus on pain control was provided. Gen. surgery recommended conservative management in hospital and an elective ventral hernia surgery repair outpatient. Patient did well with this plan. He was instructed to lose 20  pounds and then follow-up with general surgery for ventral hernia repair.  His diabetes was reasonably well controlled with as needed insulin in the hospital. He was returned to his metformin 1000 mg twice a day outpatient. He unfortunately missed his eye exam as he was in the hospital.  He has a history of pulmonary  embolism and is on Eliquis. He was placed on IV heparin in case he needed surgery in the hospital. Eliquis was was resumed when he was discharged.  His morbid obesity was noted with BMI over 45. Instructed on weigh tloss  Was compliant with sleep apnea during hospitalization  He overall is feeling very well but does have some fatigue which does seem to be improved today   See problem oriented charting as well ROS- some fatigue. Denies abdominal pain. Denies hypoglycemia. Denies chest pain or shortness of breath.   Past Medical History-  Patient Active Problem List   Diagnosis Date Noted  . Bowel obstruction 07/16/2016    Priority: High  . Diabetes mellitus with hyperglycemia ([place]) 03/31/2013    Priority: High  . Pulmonary embolism ([place]) 05/05/2012    Priority: High  . Morbid obesity with body mass index of 40.0-44.9 in adult [facility]) 05/05/2012    Priority: High  . DVT of left distal popliteal vein 05/05/2012    Priority: High  . Chronic venous insufficiency 01/25/2015    Priority: Medium  . Obstructive sleep apnea 05/05/2012    Priority: Medium  . Hyponatremia 03/23/2013    Priority: Low    Medications- reviewed and updated Current  Outpatient Prescriptions  Medication Sig Dispense Refill  . ELIQUIS 5 MG TABS tablet TAKE 1 TABLET TWICE A DAY 60 tablet 1  . furosemide (LASIX) 40 MG tablet Take 1 tablet (40 mg total) by mouth daily. 30 tablet 5  . metFORMIN (GLUCOPHAGE) 500 MG tablet TAKE 2 TABLETS (1,000 MG TOTAL) BY MOUTH 2 (TWO) TIMES DAILY WITH A MEAL. 120 tablet 1  . Multiple Vitamin (MULTIVITAMIN WITH MINERALS) TABS tablet Take 1 tablet by mouth daily.     .  ondansetron (ZOFRAN ODT) 4 MG disintegrating tablet Take 1 tablet (4 mg total) by mouth every 8 (eight) hours as needed for nausea or vomiting. 20 tablet 0  . OVER THE COUNTER MEDICATION Apply 1 application topically daily. Medication: Back Balm. Patient states he apply to his legs every day.    . senna-docusate (SENOKOT S) 8.6-50 MG tablet Take 1 tablet by mouth at bedtime as needed for mild constipation. Also available OTC 30 tablet 0   No current facility-administered medications for this visit.     Objective: BP 138/78 (BP Location: Left Arm, Patient Position: Sitting, Cuff Size: Large)   Pulse 87   Temp 99 F (37.2 C) (Oral)   Ht 6\' 2"  (1.88 m)   Wt (!) 346 lb 6.4 oz (157.1 kg)   SpO2 97%   BMI 44.48 kg/m  Gen: NAD, resting comfortably CV: RRR no murmurs rubs or gallops Lungs: CTAB no crackles, wheeze, rhonchi Abdomen: soft/nontender/nondistended/normal bowel sounds. No rebound or guarding. Morbidly obese. Ventral hernia noted Ext: 1+ edema under compression stockings Skin: warm, dry, no rash Neuro: grossly normal, moves all extremities  Assessment/Plan:  Pulmonary embolism (HCC) Eliquis 5mg  BID long term. Restarted when left hospital  Morbid obesity with body mass index of 40.0-44.9 in adult [facility]) He had at time of nutrition consultation. He actually is our he lost 12 pounds since I saw him last and is making excellent progress. He feels confident about his ability to lose weight at this point given his progress.  Diabetes mellitus with hyperglycemia ([place]) Lab Results  Component Value Date   HGBA1C 7.2 (H) 07/25/2016  Drastic improvement in A1c with focus on weight loss and loss of 12 pounds. He is also on metformin 1000 mg twice a day. Given his continued weight loss efforts we will not add additional medication.  Obstructive sleep apnea Continues cpap  Bowel obstruction Partial bowel obstruction was due to ventral hernia incarceration. He has done well with  conservative management. He is going to focus on losing another 20 pounds then follow-up with general surgery as this is the second small bowel obstruction due to this ventral hernia in the last 18 months.   Orders Placed This Encounter  Procedures  . Hemoglobin A1c    [place]  . CBC with Differential/Platelet  . Comprehensive metabolic panel    [place]   Return precautions advised.  Garret Reddish, MD

## 2016-07-25 NOTE — Assessment & Plan Note (Signed)
Lab Results  Component Value Date   HGBA1C 7.2 (H) 07/25/2016  Drastic improvement in A1c with focus on weight loss and loss of 12 pounds. He is also on metformin 1000 mg twice a day. Given his continued weight loss efforts we will not add additional medication.

## 2016-07-25 NOTE — Progress Notes (Signed)
Pre visit review using our clinic review tool, if applicable. No additional management support is needed unless otherwise documented below in the visit note. 

## 2016-07-25 NOTE — Assessment & Plan Note (Signed)
Eliquis 5mg  BID long term. Restarted when left hospital

## 2016-07-25 NOTE — Assessment & Plan Note (Signed)
He had at time of nutrition consultation. He actually is our he lost 12 pounds since I saw him last and is making excellent progress. He feels confident about his ability to lose weight at this point given his progress.

## 2016-07-25 NOTE — Patient Instructions (Signed)
Please stop by lab before you go  FANTASTIC job losing 12 lbs since last visit!!!!  Surgeons want another 20 lbs before surgery but you are well on your way. Hopefully can get back in the gym in next few weeks- if more than mild shortness of breath or any chest pain see [place] immediately. Start slow and build up on the exercise such as 10 minutes walking 5 days a week to start

## 2016-07-25 NOTE — Assessment & Plan Note (Signed)
Continues cpap.  

## 2016-08-18 ENCOUNTER — Other Ambulatory Visit: Payer: Self-pay | Admitting: Family Medicine

## 2016-08-27 ENCOUNTER — Other Ambulatory Visit: Payer: Self-pay | Admitting: Family Medicine

## 2016-10-27 ENCOUNTER — Other Ambulatory Visit: Payer: Self-pay | Admitting: Family Medicine

## 2016-11-03 ENCOUNTER — Other Ambulatory Visit: Payer: Self-pay | Admitting: Family Medicine

## 2017-01-25 ENCOUNTER — Other Ambulatory Visit: Payer: Self-pay | Admitting: Family Medicine

## 2017-02-17 ENCOUNTER — Telehealth: Payer: Self-pay

## 2017-02-17 NOTE — Telephone Encounter (Signed)
Called patient as we received a notice from [facility] regarding patient's Metformin use. I left a voicemail message asking for a return phone call as I need to schedule patient for a follow up visit and find out if he is taking his Metformin.

## 2017-04-26 ENCOUNTER — Emergency Department (HOSPITAL_COMMUNITY): Payer: PRIVATE HEALTH INSURANCE | Admitting: Certified Registered"

## 2017-04-26 ENCOUNTER — Emergency Department (HOSPITAL_COMMUNITY): Payer: PRIVATE HEALTH INSURANCE

## 2017-04-26 ENCOUNTER — Encounter (HOSPITAL_COMMUNITY): Payer: Self-pay | Admitting: Emergency Medicine

## 2017-04-26 ENCOUNTER — Inpatient Hospital Stay (HOSPITAL_COMMUNITY)
Admission: EM | Admit: 2017-04-26 | Discharge: 2017-04-29 | DRG: 354 | Disposition: A | Discharge status: Home / Self Care | Payer: PRIVATE HEALTH INSURANCE | Attending: Surgery | Admitting: Surgery

## 2017-04-26 ENCOUNTER — Encounter (HOSPITAL_COMMUNITY): Admission: EM | Disposition: A | Discharge status: Home / Self Care | Payer: Self-pay | Source: Home / Self Care

## 2017-04-26 ENCOUNTER — Other Ambulatory Visit: Payer: Self-pay

## 2017-04-26 DIAGNOSIS — Z8619 Personal history of other infectious and parasitic diseases: Secondary | ICD-10-CM | POA: Diagnosis not present

## 2017-04-26 DIAGNOSIS — G4733 Obstructive sleep apnea (adult) (pediatric): Secondary | ICD-10-CM | POA: Diagnosis present

## 2017-04-26 DIAGNOSIS — E669 Obesity, unspecified: Secondary | ICD-10-CM | POA: Diagnosis present

## 2017-04-26 DIAGNOSIS — Z23 Encounter for immunization: Secondary | ICD-10-CM

## 2017-04-26 DIAGNOSIS — Z86711 Personal history of pulmonary embolism: Secondary | ICD-10-CM | POA: Diagnosis not present

## 2017-04-26 DIAGNOSIS — Z6841 Body Mass Index (BMI) 40.0 and over, adult: Secondary | ICD-10-CM

## 2017-04-26 DIAGNOSIS — K430 Incisional hernia with obstruction, without gangrene: Principal | ICD-10-CM | POA: Diagnosis present

## 2017-04-26 DIAGNOSIS — Z86718 Personal history of other venous thrombosis and embolism: Secondary | ICD-10-CM | POA: Diagnosis not present

## 2017-04-26 DIAGNOSIS — Z833 Family history of diabetes mellitus: Secondary | ICD-10-CM | POA: Diagnosis not present

## 2017-04-26 DIAGNOSIS — K436 Other and unspecified ventral hernia with obstruction, without gangrene: Secondary | ICD-10-CM

## 2017-04-26 DIAGNOSIS — Z7984 Long term (current) use of oral hypoglycemic drugs: Secondary | ICD-10-CM

## 2017-04-26 DIAGNOSIS — N12 Tubulo-interstitial nephritis, not specified as acute or chronic: Secondary | ICD-10-CM

## 2017-04-26 DIAGNOSIS — N179 Acute kidney failure, unspecified: Secondary | ICD-10-CM | POA: Diagnosis present

## 2017-04-26 DIAGNOSIS — Z9889 Other specified postprocedural states: Secondary | ICD-10-CM

## 2017-04-26 DIAGNOSIS — K760 Fatty (change of) liver, not elsewhere classified: Secondary | ICD-10-CM | POA: Diagnosis present

## 2017-04-26 DIAGNOSIS — E1151 Type 2 diabetes mellitus with diabetic peripheral angiopathy without gangrene: Secondary | ICD-10-CM | POA: Diagnosis present

## 2017-04-26 DIAGNOSIS — Z8719 Personal history of other diseases of the digestive system: Secondary | ICD-10-CM

## 2017-04-26 DIAGNOSIS — Z7901 Long term (current) use of anticoagulants: Secondary | ICD-10-CM | POA: Diagnosis not present

## 2017-04-26 HISTORY — PX: VENTRAL HERNIA REPAIR: SHX424

## 2017-04-26 HISTORY — DX: Tubulo-interstitial nephritis, not specified as acute or chronic: N12

## 2017-04-26 LAB — GLUCOSE, CAPILLARY
Glucose-Capillary: 123 mg/dL — ABNORMAL HIGH (ref 65–99)
Glucose-Capillary: 147 mg/dL — ABNORMAL HIGH (ref 65–99)
Glucose-Capillary: 148 mg/dL — ABNORMAL HIGH (ref 65–99)
Glucose-Capillary: 149 mg/dL — ABNORMAL HIGH (ref 65–99)

## 2017-04-26 LAB — URINALYSIS, ROUTINE W REFLEX MICROSCOPIC
Bilirubin Urine: NEGATIVE
Glucose, UA: NEGATIVE mg/dL
Hgb urine dipstick: NEGATIVE
Ketones, ur: 15 mg/dL — AB
Leukocytes, UA: NEGATIVE
Nitrite: POSITIVE — AB
Protein, ur: NEGATIVE mg/dL
Specific Gravity, Urine: 1.025 (ref 1.005–1.030)
pH: 5.5 (ref 5.0–8.0)

## 2017-04-26 LAB — COMPREHENSIVE METABOLIC PANEL
ALT: 14 U/L — ABNORMAL LOW (ref 17–63)
AST: 16 U/L (ref 15–41)
Albumin: 3.6 g/dL (ref 3.5–5.0)
Alkaline Phosphatase: 75 U/L (ref 38–126)
Anion gap: 12 (ref 5–15)
BUN: 19 mg/dL (ref 6–20)
CO2: 22 mmol/L (ref 22–32)
Calcium: 9.1 mg/dL (ref 8.9–10.3)
Chloride: 100 mmol/L — ABNORMAL LOW (ref 101–111)
Creatinine, Ser: 1.98 mg/dL — ABNORMAL HIGH (ref 0.61–1.24)
GFR calc Af Amer: 41 mL/min — ABNORMAL LOW (ref >60)
GFR calc non Af Amer: 36 mL/min — ABNORMAL LOW (ref >60)
Glucose, Bld: 165 mg/dL — ABNORMAL HIGH (ref 65–99)
Potassium: 4.7 mmol/L (ref 3.5–5.1)
Sodium: 134 mmol/L — ABNORMAL LOW (ref 135–145)
Total Bilirubin: 1.2 mg/dL (ref 0.3–1.2)
Total Protein: 7.7 g/dL (ref 6.5–8.1)

## 2017-04-26 LAB — CBC
HCT: 41.3 % (ref 39.0–52.0)
Hemoglobin: 13.6 g/dL (ref 13.0–17.0)
MCH: 28.5 pg (ref 26.0–34.0)
MCHC: 32.9 g/dL (ref 30.0–36.0)
MCV: 86.6 fL (ref 78.0–100.0)
Platelets: 266 10*3/uL (ref 150–400)
RBC: 4.77 MIL/uL (ref 4.22–5.81)
RDW: 14.3 % (ref 11.5–15.5)
WBC: 12.2 10*3/uL — ABNORMAL HIGH (ref 4.0–10.5)

## 2017-04-26 LAB — URINALYSIS, MICROSCOPIC (REFLEX)

## 2017-04-26 LAB — LIPASE, BLOOD: Lipase: 19 U/L (ref 11–51)

## 2017-04-26 SURGERY — REPAIR, HERNIA, VENTRAL
Anesthesia: General | Site: Abdomen

## 2017-04-26 MED ORDER — MEPERIDINE HCL 25 MG/ML IJ SOLN: 6.2500 mg | INTRAMUSCULAR | Status: DC | PRN

## 2017-04-26 MED ORDER — SUGAMMADEX SODIUM 500 MG/5ML IV SOLN
INTRAVENOUS | Status: AC
  Filled 2017-04-26: qty 5

## 2017-04-26 MED ORDER — INSULIN ASPART 100 UNIT/ML ~~LOC~~ SOLN
0.0000 [IU] | SUBCUTANEOUS | Status: DC
  Administered 2017-04-26 – 2017-04-29 (×8): 3 [IU] via SUBCUTANEOUS

## 2017-04-26 MED ORDER — FENTANYL CITRATE (PF) 100 MCG/2ML IJ SOLN
INTRAMUSCULAR | Status: DC | PRN
  Administered 2017-04-26: 50 ug via INTRAVENOUS
  Administered 2017-04-26: 25 ug via INTRAVENOUS
  Administered 2017-04-26 (×2): 50 ug via INTRAVENOUS
  Administered 2017-04-26: 25 ug via INTRAVENOUS

## 2017-04-26 MED ORDER — CHLORHEXIDINE GLUCONATE CLOTH 2 % EX PADS: 6.0000 | MEDICATED_PAD | Freq: Once | CUTANEOUS | Status: DC

## 2017-04-26 MED ORDER — SUGAMMADEX SODIUM 200 MG/2ML IV SOLN
INTRAVENOUS | Status: DC | PRN
  Administered 2017-04-26: 500 mg via INTRAVENOUS

## 2017-04-26 MED ORDER — DEXAMETHASONE SODIUM PHOSPHATE 10 MG/ML IJ SOLN
INTRAMUSCULAR | Status: DC | PRN
  Administered 2017-04-26: 10 mg via INTRAVENOUS

## 2017-04-26 MED ORDER — ONDANSETRON HCL 4 MG/2ML IJ SOLN
4.0000 mg | Freq: Once | INTRAMUSCULAR | Status: AC
  Administered 2017-04-26: 4 mg via INTRAVENOUS
  Filled 2017-04-26: qty 2

## 2017-04-26 MED ORDER — 0.9 % SODIUM CHLORIDE (POUR BTL) OPTIME
TOPICAL | Status: DC | PRN
  Administered 2017-04-26: 1000 mL

## 2017-04-26 MED ORDER — LACTATED RINGERS IV SOLN: INTRAVENOUS | Status: DC

## 2017-04-26 MED ORDER — PROMETHAZINE HCL 25 MG/ML IJ SOLN: 6.2500 mg | INTRAMUSCULAR | Status: DC | PRN

## 2017-04-26 MED ORDER — SUCCINYLCHOLINE CHLORIDE 200 MG/10ML IV SOSY
PREFILLED_SYRINGE | INTRAVENOUS | Status: AC
  Filled 2017-04-26: qty 10

## 2017-04-26 MED ORDER — HYDROMORPHONE HCL 1 MG/ML IJ SOLN: 0.2500 mg | INTRAMUSCULAR | Status: DC | PRN

## 2017-04-26 MED ORDER — ONDANSETRON 4 MG PO TBDP
4.0000 mg | ORAL_TABLET | Freq: Once | ORAL | Status: AC
  Administered 2017-04-26: 4 mg via ORAL
  Filled 2017-04-26: qty 1

## 2017-04-26 MED ORDER — CEFOTETAN DISODIUM-DEXTROSE 2-2.08 GM-%(50ML) IV SOLR
INTRAVENOUS | Status: AC
  Filled 2017-04-26: qty 50

## 2017-04-26 MED ORDER — DEXAMETHASONE SODIUM PHOSPHATE 10 MG/ML IJ SOLN
INTRAMUSCULAR | Status: AC
  Filled 2017-04-26: qty 1

## 2017-04-26 MED ORDER — CEFOTETAN DISODIUM-DEXTROSE 2-2.08 GM-%(50ML) IV SOLR
2.0000 g | INTRAVENOUS | Status: AC
  Administered 2017-04-26: 2 g via INTRAVENOUS

## 2017-04-26 MED ORDER — FUROSEMIDE 10 MG/ML IJ SOLN
20.0000 mg | Freq: Every day | INTRAMUSCULAR | Status: DC
  Administered 2017-04-27 – 2017-04-29 (×3): 20 mg via INTRAVENOUS
  Filled 2017-04-26 (×3): qty 2

## 2017-04-26 MED ORDER — LIDOCAINE 2% (20 MG/ML) 5 ML SYRINGE
INTRAMUSCULAR | Status: AC
  Filled 2017-04-26: qty 5

## 2017-04-26 MED ORDER — HYDRALAZINE HCL 20 MG/ML IJ SOLN: 10.0000 mg | INTRAMUSCULAR | Status: DC | PRN

## 2017-04-26 MED ORDER — ONDANSETRON HCL 4 MG/2ML IJ SOLN
4.0000 mg | Freq: Four times a day (QID) | INTRAMUSCULAR | Status: DC | PRN
  Administered 2017-04-26 – 2017-04-27 (×2): 4 mg via INTRAVENOUS
  Filled 2017-04-26 (×2): qty 2

## 2017-04-26 MED ORDER — PANTOPRAZOLE SODIUM 40 MG IV SOLR
40.0000 mg | Freq: Every day | INTRAVENOUS | Status: DC
  Administered 2017-04-26 – 2017-04-28 (×3): 40 mg via INTRAVENOUS
  Filled 2017-04-26 (×3): qty 40

## 2017-04-26 MED ORDER — PROPOFOL 10 MG/ML IV BOLUS
INTRAVENOUS | Status: AC
  Filled 2017-04-26: qty 20

## 2017-04-26 MED ORDER — ONDANSETRON HCL 4 MG/2ML IJ SOLN
INTRAMUSCULAR | Status: DC | PRN
  Administered 2017-04-26: 4 mg via INTRAVENOUS

## 2017-04-26 MED ORDER — ENOXAPARIN SODIUM 40 MG/0.4ML ~~LOC~~ SOLN
40.0000 mg | SUBCUTANEOUS | Status: DC
  Administered 2017-04-27 – 2017-04-28 (×2): 40 mg via SUBCUTANEOUS
  Filled 2017-04-26 (×2): qty 0.4

## 2017-04-26 MED ORDER — SODIUM CHLORIDE 0.9 % IV BOLUS (SEPSIS)
1000.0000 mL | Freq: Once | INTRAVENOUS | Status: AC
  Administered 2017-04-26: 1000 mL via INTRAVENOUS

## 2017-04-26 MED ORDER — DIPHENHYDRAMINE HCL 50 MG/ML IJ SOLN: 25.0000 mg | Freq: Four times a day (QID) | INTRAMUSCULAR | Status: DC | PRN

## 2017-04-26 MED ORDER — ROCURONIUM BROMIDE 10 MG/ML (PF) SYRINGE
PREFILLED_SYRINGE | INTRAVENOUS | Status: DC | PRN
  Administered 2017-04-26: 20 mg via INTRAVENOUS
  Administered 2017-04-26: 40 mg via INTRAVENOUS
  Administered 2017-04-26: 10 mg via INTRAVENOUS

## 2017-04-26 MED ORDER — FENTANYL CITRATE (PF) 250 MCG/5ML IJ SOLN
INTRAMUSCULAR | Status: AC
  Filled 2017-04-26: qty 5

## 2017-04-26 MED ORDER — POTASSIUM CHLORIDE IN NACL 20-0.9 MEQ/L-% IV SOLN
INTRAVENOUS | Status: DC
  Administered 2017-04-26: via INTRAVENOUS
  Administered 2017-04-26: 1 mL via INTRAVENOUS
  Filled 2017-04-26 (×2): qty 1000

## 2017-04-26 MED ORDER — PROPOFOL 10 MG/ML IV BOLUS
INTRAVENOUS | Status: DC | PRN
  Administered 2017-04-26: 170 mg via INTRAVENOUS
  Administered 2017-04-26: 30 mg via INTRAVENOUS

## 2017-04-26 MED ORDER — HYDROMORPHONE HCL 1 MG/ML IJ SOLN
1.0000 mg | Freq: Once | INTRAMUSCULAR | Status: AC
  Administered 2017-04-26: 1 mg via INTRAVENOUS
  Filled 2017-04-26: qty 1

## 2017-04-26 MED ORDER — FENTANYL CITRATE (PF) 100 MCG/2ML IJ SOLN
50.0000 ug | Freq: Once | INTRAMUSCULAR | Status: AC
  Administered 2017-04-26: 50 ug via INTRAVENOUS
  Filled 2017-04-26: qty 2

## 2017-04-26 MED ORDER — SUCCINYLCHOLINE CHLORIDE 20 MG/ML IJ SOLN
INTRAMUSCULAR | Status: DC | PRN
  Administered 2017-04-26: 140 mg via INTRAVENOUS

## 2017-04-26 MED ORDER — IOPAMIDOL (ISOVUE-300) INJECTION 61%
INTRAVENOUS | Status: AC
  Administered 2017-04-26: 75 mL
  Filled 2017-04-26: qty 75

## 2017-04-26 MED ORDER — DIPHENHYDRAMINE HCL 25 MG PO CAPS: 25.0000 mg | ORAL_CAPSULE | Freq: Four times a day (QID) | ORAL | Status: DC | PRN

## 2017-04-26 MED ORDER — LACTATED RINGERS IV SOLN
INTRAVENOUS | Status: DC | PRN
  Administered 2017-04-26: 14:00:00 via INTRAVENOUS

## 2017-04-26 MED ORDER — DEXMEDETOMIDINE HCL IN NACL 200 MCG/50ML IV SOLN
INTRAVENOUS | Status: AC
  Filled 2017-04-26: qty 50

## 2017-04-26 MED ORDER — MIDAZOLAM HCL 2 MG/2ML IJ SOLN
INTRAMUSCULAR | Status: AC
  Filled 2017-04-26: qty 2

## 2017-04-26 MED ORDER — HYDROMORPHONE HCL 1 MG/ML IJ SOLN
0.5000 mg | INTRAMUSCULAR | Status: DC | PRN
  Administered 2017-04-26 – 2017-04-27 (×6): 1 mg via INTRAVENOUS
  Filled 2017-04-26 (×6): qty 1

## 2017-04-26 MED ORDER — METHOCARBAMOL 1000 MG/10ML IJ SOLN: 1000.0000 mg | Freq: Three times a day (TID) | INTRAVENOUS | Status: DC | PRN

## 2017-04-26 MED ORDER — LIDOCAINE 2% (20 MG/ML) 5 ML SYRINGE
INTRAMUSCULAR | Status: DC | PRN
  Administered 2017-04-26: 60 mg via INTRAVENOUS

## 2017-04-26 MED ORDER — MIDAZOLAM HCL 2 MG/2ML IJ SOLN
INTRAMUSCULAR | Status: DC | PRN
  Administered 2017-04-26: 1 mg via INTRAVENOUS

## 2017-04-26 MED ORDER — BUPIVACAINE-EPINEPHRINE (PF) 0.25% -1:200000 IJ SOLN
INTRAMUSCULAR | Status: AC
  Filled 2017-04-26: qty 30

## 2017-04-26 MED ORDER — ONDANSETRON 4 MG PO TBDP: 4.0000 mg | ORAL_TABLET | Freq: Four times a day (QID) | ORAL | Status: DC | PRN

## 2017-04-26 MED ORDER — METOCLOPRAMIDE HCL 5 MG/ML IJ SOLN
10.0000 mg | Freq: Once | INTRAMUSCULAR | Status: AC
Start: 2017-04-26 — End: 2017-04-26
  Administered 2017-04-26: 10 mg via INTRAVENOUS
  Filled 2017-04-26: qty 2

## 2017-04-26 MED ORDER — ROCURONIUM BROMIDE 10 MG/ML (PF) SYRINGE
PREFILLED_SYRINGE | INTRAVENOUS | Status: AC
  Filled 2017-04-26: qty 5

## 2017-04-26 MED ORDER — DEXMEDETOMIDINE HCL IN NACL 200 MCG/50ML IV SOLN
INTRAVENOUS | Status: DC | PRN
  Administered 2017-04-26 (×2): 8 ug via INTRAVENOUS

## 2017-04-26 MED ORDER — ONDANSETRON HCL 4 MG/2ML IJ SOLN
INTRAMUSCULAR | Status: AC
  Filled 2017-04-26: qty 2

## 2017-04-26 SURGICAL SUPPLY — 48 items
BINDER ABDOMINAL 12 ML 46-62 (SOFTGOODS) ×2 IMPLANT
BLADE CLIPPER SURG (BLADE) IMPLANT
CANISTER SUCT 3000ML PPV (MISCELLANEOUS) ×2 IMPLANT
CHLORAPREP W/TINT 26ML (MISCELLANEOUS) ×2 IMPLANT
COVER SURGICAL LIGHT HANDLE (MISCELLANEOUS) ×2 IMPLANT
DERMABOND ADVANCED (GAUZE/BANDAGES/DRESSINGS) ×1
DERMABOND ADVANCED .7 DNX12 (GAUZE/BANDAGES/DRESSINGS) ×1 IMPLANT
DRAPE LAPAROSCOPIC ABDOMINAL (DRAPES) ×2 IMPLANT
DRAPE UTILITY XL STRL (DRAPES) ×4 IMPLANT
ELECT CAUTERY BLADE 6.4 (BLADE) ×2 IMPLANT
ELECT REM PT RETURN 9FT ADLT (ELECTROSURGICAL) ×2
ELECTRODE REM PT RTRN 9FT ADLT (ELECTROSURGICAL) ×1 IMPLANT
GAUZE SPONGE 4X4 12PLY STRL (GAUZE/BANDAGES/DRESSINGS) IMPLANT
GAUZE SPONGE 4X4 12PLY STRL LF (GAUZE/BANDAGES/DRESSINGS) ×2 IMPLANT
GLOVE BIO SURGEON STRL SZ8 (GLOVE) ×4 IMPLANT
GLOVE BIOGEL PI IND STRL 6.5 (GLOVE) ×3 IMPLANT
GLOVE BIOGEL PI IND STRL 7.0 (GLOVE) ×2 IMPLANT
GLOVE BIOGEL PI IND STRL 8 (GLOVE) ×1 IMPLANT
GLOVE BIOGEL PI INDICATOR 6.5 (GLOVE) ×3
GLOVE BIOGEL PI INDICATOR 7.0 (GLOVE) ×2
GLOVE BIOGEL PI INDICATOR 8 (GLOVE) ×1
GLOVE SKINSENSE NS SZ6.5 (GLOVE) ×1
GLOVE SKINSENSE NS SZ7.0 (GLOVE) ×2
GLOVE SKINSENSE STRL SZ6.5 (GLOVE) ×1 IMPLANT
GLOVE SKINSENSE STRL SZ7.0 (GLOVE) ×2 IMPLANT
GOWN STRL REUS W/ TWL LRG LVL3 (GOWN DISPOSABLE) ×3 IMPLANT
GOWN STRL REUS W/ TWL XL LVL3 (GOWN DISPOSABLE) ×1 IMPLANT
GOWN STRL REUS W/TWL LRG LVL3 (GOWN DISPOSABLE) ×3
GOWN STRL REUS W/TWL XL LVL3 (GOWN DISPOSABLE) ×1
KIT BASIN OR (CUSTOM PROCEDURE TRAY) ×2 IMPLANT
KIT ROOM TURNOVER OR (KITS) ×2 IMPLANT
NEEDLE 22X1 1/2 (OR ONLY) (NEEDLE) ×2 IMPLANT
NS IRRIG 1000ML POUR BTL (IV SOLUTION) ×2 IMPLANT
PACK GENERAL/GYN (CUSTOM PROCEDURE TRAY) ×2 IMPLANT
PAD ABD 8X10 STRL (GAUZE/BANDAGES/DRESSINGS) ×2 IMPLANT
PAD ARMBOARD 7.5X6 YLW CONV (MISCELLANEOUS) ×4 IMPLANT
STAPLER VISISTAT 35W (STAPLE) IMPLANT
SUT MNCRL AB 4-0 PS2 18 (SUTURE) ×2 IMPLANT
SUT NOVA 1 T20/GS 25DT (SUTURE) ×4 IMPLANT
SUT PROLENE 0 CT 2 (SUTURE) ×2 IMPLANT
SUT VIC AB 3-0 SH 27 (SUTURE) ×1
SUT VIC AB 3-0 SH 27X BRD (SUTURE) ×1 IMPLANT
SYR CONTROL 10ML LL (SYRINGE) ×2 IMPLANT
TAPE CLOTH SURG 6X10 WHT LF (GAUZE/BANDAGES/DRESSINGS) ×2 IMPLANT
TOWEL OR 17X24 6PK STRL BLUE (TOWEL DISPOSABLE) IMPLANT
TOWEL OR 17X26 10 PK STRL BLUE (TOWEL DISPOSABLE) ×2 IMPLANT
TRAY FOLEY W/METER SILVER 14FR (SET/KITS/TRAYS/PACK) IMPLANT
WATER STERILE IRR 1000ML POUR (IV SOLUTION) ×2 IMPLANT

## 2017-04-26 NOTE — Anesthesia Preprocedure Evaluation (Addendum)
Anesthesia Evaluation  Patient identified by MRN, date of birth, ID band Patient awake    Reviewed: Allergy & Precautions, NPO status , Patient's Chart, lab work & pertinent test results  Airway Mallampati: III  TM Distance: >3 FB Neck ROM: Full    Dental  (+) Teeth Intact, Dental Advisory Given, Chipped,    Pulmonary sleep apnea ,    Pulmonary exam normal        Cardiovascular + Peripheral Vascular Disease  Normal cardiovascular exam     Neuro/Psych negative neurological ROS     GI/Hepatic negative GI ROS, Neg liver ROS,   Endo/Other  negative endocrine ROSdiabetes, Type 2, Oral Hypoglycemic Agents  Renal/GU negative Renal ROS     Musculoskeletal negative musculoskeletal ROS (+)   Abdominal   Peds  Hematology negative hematology ROS (+)   Anesthesia Other Findings Day of surgery medications reviewed with the patient.  Reproductive/Obstetrics                           Lab Results  Component Value Date   WBC 12.2 (H) 04/26/2017   HGB 13.6 04/26/2017   HCT 41.3 04/26/2017   MCV 86.6 04/26/2017   PLT 266 04/26/2017   Lab Results  Component Value Date   CREATININE 1.98 (H) 04/26/2017   BUN 19 04/26/2017   NA 134 (L) 04/26/2017   K 4.7 04/26/2017   CL 100 (L) 04/26/2017   CO2 22 04/26/2017   Lab Results  Component Value Date   INR 1.15 06/16/2015   INR 1.17 12/14/2013   INR 1.20 05/07/2012   EKG: sinus tachycardia.  Anesthesia Physical Anesthesia Plan  ASA: III  Anesthesia Plan: General   Post-op Pain Management:    Induction: Intravenous, Rapid sequence and Cricoid pressure planned  PONV Risk Score and Plan: 4 or greater and Ondansetron, Dexamethasone and Midazolam  Airway Management Planned: Oral ETT and Video Laryngoscope Planned  Additional Equipment:   Intra-op Plan:   Post-operative Plan: Possible Post-op intubation/ventilation  Informed Consent: I have  reviewed the patients History and Physical, chart, labs and discussed the procedure including the risks, benefits and alternatives for the proposed anesthesia with the patient or authorized representative who has indicated his/her understanding and acceptance.   Dental advisory given  Plan Discussed with: CRNA  Anesthesia Plan Comments:        Anesthesia Quick Evaluation

## 2017-04-26 NOTE — Transfer of Care (Signed)
Immediate Anesthesia Transfer of Care Note  [name]  Procedure(s) Performed: INCARCERATED HERNIA REPAIR VENTRAL ADULT (N/A Abdomen)  Patient Location: PACU  Anesthesia Type:General  Level of Consciousness: awake, alert , oriented and patient cooperative  Airway & Oxygen Therapy: Patient Spontanous Breathing and Patient connected to face mask oxygen  Post-op Assessment: Report given to RN  Post vital signs: Reviewed and stable  Last Vitals:  Vitals:   04/26/17 1300 04/26/17 1512  BP: (!) 144/85 128/87  Pulse: 98 100  Resp:  19  Temp:    SpO2: 94% 97%    Last Pain:  Vitals:   04/26/17 1201  TempSrc:   PainSc: 0-No pain         Complications: No apparent anesthesia complications

## 2017-04-26 NOTE — ED Provider Notes (Signed)
[facility] EMERGENCY DEPARTMENT Provider Note  CSN: [id number] Arrival date & time: 04/26/17 0355  Chief Complaint(s) Abdominal Pain  HPI Evan Leonard is a 56 y.o. male   The history is provided by the patient.  Abdominal Pain   This is a new problem. The current episode started yesterday. The problem occurs constantly. Progression since onset: fluctuating. The pain is associated with an unknown factor. The pain is located in the periumbilical region and epigastric region. The quality of the pain is dull, sharp and colicky. Pain severity now: mild to severe. Associated symptoms include nausea and vomiting. Pertinent negatives include fever, belching, diarrhea, hematochezia, melena, constipation, dysuria and frequency. Nothing aggravates the symptoms. Nothing relieves the symptoms.    Past Medical History Past Medical History:  Diagnosis Date  . Diabetes mellitus, type II ([place]) 02/2013  . DVT (deep venous thrombosis) ([place]) 05/04/2012   LLE  . Exertional dyspnea 05/04/2012   "isolated episode" (05/05/2012)  . Hepatic steatosis   . History of chickenpox   . Obesity   . OSA on CPAP   . Pulmonary embolism ([place]) 05/04/2012   bilaterally  . Small bowel obstruction ([place]) 09/06/2015  . Varicose veins    Patient Active Problem List   Diagnosis Date Noted  . Bowel obstruction ([place]) 07/16/2016  . Chronic venous insufficiency 01/25/2015  . Diabetes mellitus with hyperglycemia ([place]) 03/31/2013  . Hyponatremia 03/23/2013  . Pulmonary embolism ([place]) 05/05/2012  . Morbid obesity with body mass index of 40.0-44.9 in adult [facility]) 05/05/2012  . Obstructive sleep apnea 05/05/2012  . DVT of left distal popliteal vein 05/05/2012   Home Medication(s) Prior to Admission medications   Medication Sig Start Date End Date Taking? Authorizing Provider  ELIQUIS 5 MG TABS tablet TAKE 1 TABLET TWICE A DAY 10/28/16   [name], MD  furosemide (LASIX) 40 MG tablet Take 1 tablet (40  mg total) by mouth daily. 06/23/15   Laurey Morale, MD  metFORMIN (GLUCOPHAGE) 500 MG tablet TAKE 2 TABLETS (1,000 MG TOTAL) BY MOUTH 2 (TWO) TIMES DAILY WITH A MEAL. 01/28/17   [name], MD  Multiple Vitamin (MULTIVITAMIN WITH MINERALS) TABS tablet Take 1 tablet by mouth daily.     [provider]  ondansetron (ZOFRAN ODT) 4 MG disintegrating tablet Take 1 tablet (4 mg total) by mouth every 8 (eight) hours as needed for nausea or vomiting. 07/19/16   Rai, Vernelle Emerald, MD  OVER THE COUNTER MEDICATION Apply 1 application topically daily. Medication: Back Balm. Patient states he apply to his legs every day.    [provider]  senna-docusate (SENOKOT S) 8.6-50 MG tablet Take 1 tablet by mouth at bedtime as needed for mild constipation. Also available OTC 07/19/16   Rai, Vernelle Emerald, MD  Past Surgical History Past Surgical History:  Procedure Laterality Date  . HERNIA REPAIR     2015  . LAPAROTOMY N/A 12/13/2013   Procedure: EXPLORATORY LAPAROTOMY Repair ventral hernia, without mesh, partial omentectomy;  Surgeon: Gwenyth Ober, MD;  Location: [facility] OR;  Service: General;  Laterality: N/A;   Family History Family History  Problem Relation Age of Onset  . Diabetes Mother        died age 62- also bleeding ulcer  . Hypertension Mother   . Heart failure Father        in his 81s (patient was 43). day before surgery planned  . Other Maternal Grandmother        states natural causes all grandparents    Social History Social History   Tobacco Use  . Smoking status: Never Smoker  . Smokeless tobacco: Never Used  Substance Use Topics  . Alcohol use: No    Alcohol/week: 0.0 oz  . Drug use: No   Allergies Patient has no known allergies.  Review of Systems Review of Systems  Constitutional: Negative for fever.  Gastrointestinal: Positive for  abdominal pain, nausea and vomiting. Negative for constipation, diarrhea, hematochezia and melena.  Genitourinary: Negative for dysuria and frequency.   All other systems are reviewed and are negative for acute change except as noted in the HPI  Physical Exam Vital Signs  I have reviewed the triage vital signs BP (!) 143/80 (BP Location: Right Arm)   Pulse 80   Temp 98.4 F (36.9 C) (Oral)   Resp 20   SpO2 95%   Physical Exam  Constitutional: He is oriented to person, place, and time. He appears well-developed and well-nourished. No distress.  HENT:  Head: Normocephalic and atraumatic.  Nose: Nose normal.  Eyes: Conjunctivae and EOM are normal. Pupils are equal, round, and reactive to light. Right eye exhibits no discharge. Left eye exhibits no discharge. No scleral icterus.  Neck: Normal range of motion. Neck supple.  Cardiovascular: Normal rate and regular rhythm. Exam reveals no gallop and no friction rub.  No murmur heard. Pulmonary/Chest: Effort normal and breath sounds normal. No stridor. No respiratory distress. He has no rales.  Abdominal: Soft. He exhibits no distension. There is tenderness in the epigastric area and periumbilical area. There is no rigidity, no rebound, no guarding and no CVA tenderness. A hernia is present. Hernia confirmed positive in the ventral area (possible supraumbilical).    obese  Musculoskeletal: He exhibits no edema or tenderness.  Neurological: He is alert and oriented to person, place, and time.  Skin: Skin is warm and dry. No rash noted. He is not diaphoretic. No erythema.  Psychiatric: He has a normal mood and affect.  Vitals reviewed.   ED Results and Treatments Labs (all labs ordered are listed, but only abnormal results are displayed) Labs Reviewed  COMPREHENSIVE METABOLIC PANEL - Abnormal; Notable for the following components:      Result Value   Sodium 134 (*)    Chloride 100 (*)    Glucose, Bld 165 (*)    Creatinine, Ser 1.98  (*)    ALT 14 (*)    GFR calc non Af Amer 36 (*)    GFR calc Af Amer 41 (*)    All other components within normal limits  CBC - Abnormal; Notable for the following components:   WBC 12.2 (*)    All other components within normal limits  URINALYSIS, ROUTINE W REFLEX MICROSCOPIC - Abnormal; Notable for the  following components:   APPearance CLOUDY (*)    Ketones, ur 15 (*)    Nitrite POSITIVE (*)    All other components within normal limits  URINALYSIS, MICROSCOPIC (REFLEX) - Abnormal; Notable for the following components:   Bacteria, UA FEW (*)    Squamous Epithelial / LPF 0-5 (*)    All other components within normal limits  LIPASE, BLOOD                                                                                                                         EKG  EKG Interpretation  Date/Time:    Ventricular Rate:    PR Interval:    QRS Duration:   QT Interval:    QTC Calculation:   R Axis:     Text Interpretation:        Radiology Ct Abdomen Pelvis W Contrast  Result Date: 04/26/2017 CLINICAL DATA:  Centralized abdominal pain EXAM: CT ABDOMEN AND PELVIS WITH CONTRAST TECHNIQUE: Multidetector CT imaging of the abdomen and pelvis was performed using the standard protocol following bolus administration of intravenous contrast. CONTRAST:  81mL ISOVUE-300 IOPAMIDOL (ISOVUE-300) INJECTION 61% COMPARISON:  07/15/2016 FINDINGS: Lower chest: Lung bases are clear. No effusions. Heart is normal size. Hepatobiliary: Increased echotexture compatible with fatty infiltration. No focal abnormality or biliary ductal dilatation. Pancreas: No focal abnormality or ductal dilatation. Spleen: No focal abnormality.  Normal size. Adrenals/Urinary Tract: Mild right hydronephrosis due to an 8 mm proximal right ureteral stone. Multiple nonobstructing right renal stones, the largest in the midpole measuring 11 mm. Small nonobstructing punctate stone in the lower pole of the left kidney. Small exophytic cyst  off the lower pole of the right kidney, stable. Adrenal glands and urinary bladder unremarkable. Stomach/Bowel: There is a supraumbilical hernia containing small bowel loops. Small bowel loops proximal to the hernia are dilated compatible with small bowel obstruction. Distal small bowel loops are decompressed. Scattered sigmoid diverticula. No active diverticulitis. Vascular/Lymphatic: No evidence of aneurysm or adenopathy. Reproductive: No visible focal abnormality. Other: No free fluid or free air. Musculoskeletal: No acute bony abnormality. IMPRESSION: Supraumbilical midline ventral hernia containing small bowel loops with associated moderate small bowel obstruction. 8 mm proximal right ureteral stone with mild right hydronephrosis and delayed excretion of contrast from the right kidney. Fatty infiltration of the liver. Electronically Signed   By: Rolm Baptise M.D.   On: 04/26/2017 10:25   Pertinent labs & imaging results that were available during my care of the patient were reviewed by me and considered in my medical decision making (see chart for details).  Medications Ordered in ED Medications  HYDROmorphone (DILAUDID) injection 1 mg (not administered)  ondansetron (ZOFRAN-ODT) disintegrating tablet 4 mg (4 mg Oral Given 04/26/17 0559)  ondansetron (ZOFRAN) injection 4 mg (4 mg Intravenous Given 04/26/17 0931)  sodium chloride 0.9 % bolus 1,000 mL (1,000 mLs Intravenous New Bag/Given 04/26/17 0930)  fentaNYL (SUBLIMAZE) injection 50 mcg (50 mcg Intravenous Given 04/26/17 0931)  iopamidol (  ISOVUE-300) 61 % injection (75 mLs  Contrast Given 04/26/17 0955)                                                                                                                                    Procedures Procedures  (including critical care time)  Medical Decision Making / ED Course I have reviewed the nursing notes for this encounter and the patient's prior records (if available in EHR or on provided  paperwork).    Workup consistent with ventral hernia resulting in small bowel obstruction.  Patient provided with IV fluids, antiemetics and IV pain medicine.  Will discuss case with general surgery for further management.  Final Clinical Impression(s) / ED Diagnoses Final diagnoses:  Ventral hernia with bowel obstruction      This chart was dictated using voice recognition software.  Despite best efforts to proofread,  errors can occur which can change the documentation meaning.   Fatima Blank, MD 04/26/17 1049

## 2017-04-26 NOTE — ED Notes (Signed)
Pt noted to have SPO2 of 91% on room air, pt placed on 2L via nasal cannula with improvement to 95%.

## 2017-04-26 NOTE — Progress Notes (Signed)
Pt does not want to wear CPAP tonight due to NG tube in place. RT will continue to monitor

## 2017-04-26 NOTE — ED Notes (Signed)
Pt valuable locked with security per request from [place], RN at short stay. Pt valuable sheet sent to short stay at tube station #[phone]

## 2017-04-26 NOTE — Consult Note (Signed)
Reason for Consult: Incarcerated ventral hernia Referring Physician: [place] MD  Evan Leonard is an 56 y.o. male.  HPI: Asked to see patient at the request of Dr. Leonette Monarch for incarcerated ventral hernia.  The patient has multiple medical problems.  Has a history of repair of an incarcerated umbilical hernia by Dr. Hulen Skains in 2017 which was done without mesh.  He developed a recurrent hernia recently and yesterday developed redness and swelling above the umbilicus.  He now has nausea and vomiting.  CT scan shows small bowel obstruction with an incarcerated recurrent ventral hernia.  He is on Eliquis for history of DVT.  He also has a history of pulmonary embolus as well.  He skipped his Eliquis yesterday.  The redness and pain increased yesterday around his umbilicus.  Past Medical History:  Diagnosis Date  . Diabetes mellitus, type II ([place]) 02/2013  . DVT (deep venous thrombosis) ([place]) 05/04/2012   LLE  . Exertional dyspnea 05/04/2012   "isolated episode" (05/05/2012)  . Hepatic steatosis   . History of chickenpox   . Obesity   . OSA on CPAP   . Pulmonary embolism ([place]) 05/04/2012   bilaterally  . Small bowel obstruction ([place]) 09/06/2015  . Varicose veins     Past Surgical History:  Procedure Laterality Date  . HERNIA REPAIR     2015  . LAPAROTOMY N/A 12/13/2013   Procedure: EXPLORATORY LAPAROTOMY Repair ventral hernia, without mesh, partial omentectomy;  Surgeon: Gwenyth Ober, MD;  Location: [facility] OR;  Service: General;  Laterality: N/A;    Family History  Problem Relation Age of Onset  . Diabetes Mother        died age 71- also bleeding ulcer  . Hypertension Mother   . Heart failure Father        in his 82s (patient was 7). day before surgery planned  . Other Maternal Grandmother        states natural causes all grandparents    Social History:  reports that  has never smoked. he has never used smokeless tobacco. He reports that he does not drink alcohol or use drugs.  Allergies:  No Known Allergies  Medications: I have reviewed the patient's current medications.  Results for orders placed or performed during the hospital encounter of 04/26/17 (from the past 48 hour(s))  Lipase, blood     Status: None   Collection Time: 04/26/17  4:25 AM  Result Value Ref Range   Lipase 19 11 - 51 U/L  Comprehensive metabolic panel     Status: Abnormal   Collection Time: 04/26/17  4:25 AM  Result Value Ref Range   Sodium 134 (L) 135 - 145 mmol/L   Potassium 4.7 3.5 - 5.1 mmol/L   Chloride 100 (L) 101 - 111 mmol/L   CO2 22 22 - 32 mmol/L   Glucose, Bld 165 (H) 65 - 99 mg/dL   BUN 19 6 - 20 mg/dL   Creatinine, Ser 1.98 (H) 0.61 - 1.24 mg/dL   Calcium 9.1 8.9 - 10.3 mg/dL   Total Protein 7.7 6.5 - 8.1 g/dL   Albumin 3.6 3.5 - 5.0 g/dL   AST 16 15 - 41 U/L   ALT 14 (L) 17 - 63 U/L   Alkaline Phosphatase 75 38 - 126 U/L   Total Bilirubin 1.2 0.3 - 1.2 mg/dL   GFR calc non Af Amer 36 (L) >60 mL/min   GFR calc Af Amer 41 (L) >60 mL/min    Comment: (NOTE) The  eGFR has been calculated using the CKD EPI equation. This calculation has not been validated in all clinical situations. eGFR's persistently <60 mL/min signify possible Chronic Kidney Disease.    Anion gap 12 5 - 15  CBC     Status: Abnormal   Collection Time: 04/26/17  4:25 AM  Result Value Ref Range   WBC 12.2 (H) 4.0 - 10.5 K/uL   RBC 4.77 4.22 - 5.81 MIL/uL   Hemoglobin 13.6 13.0 - 17.0 g/dL   HCT 41.3 39.0 - 52.0 %   MCV 86.6 78.0 - 100.0 fL   MCH 28.5 26.0 - 34.0 pg   MCHC 32.9 30.0 - 36.0 g/dL   RDW 14.3 11.5 - 15.5 %   Platelets 266 150 - 400 K/uL  Urinalysis, Routine w reflex microscopic     Status: Abnormal   Collection Time: 04/26/17  4:28 AM  Result Value Ref Range   Color, Urine YELLOW YELLOW   APPearance CLOUDY (A) CLEAR   Specific Gravity, Urine 1.025 1.005 - 1.030   pH 5.5 5.0 - 8.0   Glucose, UA NEGATIVE NEGATIVE mg/dL   Hgb urine dipstick NEGATIVE NEGATIVE   Bilirubin Urine NEGATIVE  NEGATIVE   Ketones, ur 15 (A) NEGATIVE mg/dL   Protein, ur NEGATIVE NEGATIVE mg/dL   Nitrite POSITIVE (A) NEGATIVE   Leukocytes, UA NEGATIVE NEGATIVE  Urinalysis, Microscopic (reflex)     Status: Abnormal   Collection Time: 04/26/17  4:28 AM  Result Value Ref Range   RBC / HPF 0-5 0 - 5 RBC/hpf   WBC, UA 0-5 0 - 5 WBC/hpf   Bacteria, UA FEW (A) NONE SEEN   Squamous Epithelial / LPF 0-5 (A) NONE SEEN    Ct Abdomen Pelvis W Contrast  Result Date: 04/26/2017 CLINICAL DATA:  Centralized abdominal pain EXAM: CT ABDOMEN AND PELVIS WITH CONTRAST TECHNIQUE: Multidetector CT imaging of the abdomen and pelvis was performed using the standard protocol following bolus administration of intravenous contrast. CONTRAST:  64m ISOVUE-300 IOPAMIDOL (ISOVUE-300) INJECTION 61% COMPARISON:  07/15/2016 FINDINGS: Lower chest: Lung bases are clear. No effusions. Heart is normal size. Hepatobiliary: Increased echotexture compatible with fatty infiltration. No focal abnormality or biliary ductal dilatation. Pancreas: No focal abnormality or ductal dilatation. Spleen: No focal abnormality.  Normal size. Adrenals/Urinary Tract: Mild right hydronephrosis due to an 8 mm proximal right ureteral stone. Multiple nonobstructing right renal stones, the largest in the midpole measuring 11 mm. Small nonobstructing punctate stone in the lower pole of the left kidney. Small exophytic cyst off the lower pole of the right kidney, stable. Adrenal glands and urinary bladder unremarkable. Stomach/Bowel: There is a supraumbilical hernia containing small bowel loops. Small bowel loops proximal to the hernia are dilated compatible with small bowel obstruction. Distal small bowel loops are decompressed. Scattered sigmoid diverticula. No active diverticulitis. Vascular/Lymphatic: No evidence of aneurysm or adenopathy. Reproductive: No visible focal abnormality. Other: No free fluid or free air. Musculoskeletal: No acute bony abnormality.  IMPRESSION: Supraumbilical midline ventral hernia containing small bowel loops with associated moderate small bowel obstruction. 8 mm proximal right ureteral stone with mild right hydronephrosis and delayed excretion of contrast from the right kidney. Fatty infiltration of the liver. Electronically Signed   By: KRolm BaptiseM.D.   On: 04/26/2017 10:25    Review of Systems  Constitutional: Positive for malaise/fatigue. Negative for chills and fever.  HENT: Negative for hearing loss and tinnitus.   Eyes: Negative for blurred vision.  Respiratory: Negative for shortness of breath.  Cardiovascular: Negative for chest pain.  Gastrointestinal: Positive for abdominal pain, nausea and vomiting.  Genitourinary: Positive for dysuria. Negative for urgency.  Musculoskeletal: Negative for myalgias and neck pain.  Skin: Positive for rash. Negative for itching.  Neurological: Positive for weakness. Negative for dizziness.  Endo/Heme/Allergies: Bruises/bleeds easily.  Psychiatric/Behavioral: Negative for depression.   Blood pressure (!) 145/86, pulse 88, temperature 98.4 F (36.9 C), temperature source Oral, resp. rate 16, SpO2 97 %. Physical Exam  Constitutional: He is oriented to person, place, and time. He appears well-developed.  HENT:  Head: Normocephalic.  Eyes: EOM are normal. Pupils are equal, round, and reactive to light.  Neck: Normal range of motion. Neck supple.  Cardiovascular: Normal rate and regular rhythm.  Respiratory: Effort normal and breath sounds normal.  GI: Soft. He exhibits distension. There is tenderness.    Neurological: He is alert and oriented to person, place, and time.  Skin: Skin is warm and dry. He is not diaphoretic.  Psychiatric: He has a normal mood and affect. His behavior is normal. Thought content normal.    Assessment/Plan: Incarcerated recurrent ventral hernia  Discussed case with Dr. Grandville Silos who is on call.  Recommend laparotomy and repair of hernia.   Patient is aware Dr. Grandville Silos will see him once he is out of the operating room.  Medicine is admitting due to multiple comorbidity.  Hold anticoagulation for now.  High risk of recurrence despite repair given BMI.The risk of hernia repair include bleeding,  Infection,   Recurrence of the hernia,  Mesh use, chronic pain,  Organ injury,  Bowel injury,  Bladder injury,   nerve injury with numbness around the incision,  Death,  and worsening of preexisting  medical problems.  The alternatives to surgery have been discussed as well..  Long term expectations of both operative and non operative treatments have been discussed.   The patient agrees to proceed.  Celestia Duva A Henery Betzold 04/26/2017, 11:54 AM

## 2017-04-26 NOTE — ED Triage Notes (Signed)
C/o stabbing pain just above umbilicus since 2pm yesterday with nausea and vomiting (x 3-4).

## 2017-04-26 NOTE — Op Note (Signed)
04/26/2017  2:58 PM  PATIENT:  Evan Leonard  56 y.o. male  PRE-OPERATIVE DIAGNOSIS:  incarcerated recurrent ventral hernia  POST-OPERATIVE DIAGNOSIS:  incarcerated recurrent ventral hernia  PROCEDURE:  Procedure(s): REPAIR INCARCERATED HERNIA REPAIR VENTRAL ADULT  SURGEON:  Surgeon(s): Georganna Skeans, MD  ASSISTANTS: none   ANESTHESIA:   general  EBL:  Total I/O In: 69 [IV Piggyback:999] Out: 8 [Blood:50]  BLOOD ADMINISTERED:none  DRAINS: none   SPECIMEN:  No Specimen  DISPOSITION OF SPECIMEN:  N/A  COUNTS:  YES  DICTATION: .Dragon Dictation Findings: Multiple hernia defects, bowel viable  Procedure in detail: Evan Leonard is brought for emergent repair of incarcerated recurrent ventral hernia.  He received intravenous antibiotics.  Informed consent was obtained.  He was brought to the operating room and general endotracheal anesthesia was administered by the anesthesia staff.  His abdomen was prepped and draped in sterile fashion.  A limited midline incision was made extending out from his umbilicus.  Subtenons tissues were dissected down revealing the hernia sac.  This was circumferentially dissected and then it was entered.  It contains some loops of small bowel.  These were all viable and reduced.  The hernia sac was excised.  There were a couple of other defects nearby and the fascia opening was enlarged to encompass these.  The bowel was rechecked and there was good viability.  Hemo stasis was ensured.  The fascial defect was then closed with interrupted #1 Novafil sutures.  Subcutaneous tissues were irrigated and the skin was closed with staples.  He tolerated the procedure well without apparent complication.  All the counts were correct and he was taken to recovery in stable condition. PATIENT DISPOSITION:  PACU - hemodynamically stable.   Delay start of Pharmacological VTE agent (>24hrs) due to surgical blood loss or risk of bleeding:  yes  Georganna Skeans, MD, MPH,  FACS Pager: [phone]  12/1/20182:58 PM

## 2017-04-26 NOTE — Anesthesia Procedure Notes (Signed)
Procedure Name: Intubation Date/Time: 04/26/2017 1:50 PM Performed by: Barrington Ellison, CRNA Pre-anesthesia Checklist: Patient identified, Emergency Drugs available, Suction available and Patient being monitored Patient Re-evaluated:Patient Re-evaluated prior to induction Oxygen Delivery Method: Circle System Utilized Preoxygenation: Pre-oxygenation with 100% oxygen Induction Type: IV induction, Rapid sequence and Cricoid Pressure applied Ventilation: Mask ventilation without difficulty Laryngoscope Size: Glidescope Grade View: Grade I Tube type: Oral Number of attempts: 1 Airway Equipment and Method: Stylet and Oral airway Placement Confirmation: ETT inserted through vocal cords under direct vision,  positive ETCO2 and breath sounds checked- equal and bilateral Secured at: 23 cm Tube secured with: Tape Dental Injury: Teeth and Oropharynx as per pre-operative assessment  Difficulty Due To: Difficult Airway- due to large tongue Future Recommendations: Recommend- induction with short-acting agent, and alternative techniques readily available Comments: Elective Glidescope due to large tongue and body habitus

## 2017-04-26 NOTE — Progress Notes (Signed)
Pt new admit from PACU s/p repair of recurrent ventral hernia, alert and oriented with left NGT tube connected to low intermittent suction, with surgical wound lower abdoment, dressing dry and intact with abdominal binder, no complain of pain at this time.

## 2017-04-26 NOTE — Progress Notes (Signed)
Patient examined. I agree with Dr. Brantley Stage. Incarcerated recurrent ventral hernia. Will proceed with emergent repair. I again reviewed the procedure, risks, and benefits with him . He agrees.  Georganna Skeans, MD, MPH, FACS Trauma: [phone] General Surgery: [phone]

## 2017-04-26 NOTE — Anesthesia Postprocedure Evaluation (Signed)
Anesthesia Post Note  [name]  Procedure(s) Performed: INCARCERATED HERNIA REPAIR VENTRAL ADULT (N/A Abdomen)     Patient location during evaluation: PACU Anesthesia Type: General Level of consciousness: awake and alert Pain management: pain level controlled Vital Signs Assessment: post-procedure vital signs reviewed and stable Respiratory status: spontaneous breathing, nonlabored ventilation, respiratory function stable and patient connected to nasal cannula oxygen Cardiovascular status: blood pressure returned to baseline and stable Postop Assessment: no apparent nausea or vomiting Anesthetic complications: no    Last Vitals:  Vitals:   04/26/17 1542 04/26/17 1603  BP: (!) 146/89 (!) 146/93  Pulse: 96 95  Resp: (!) 23 (!) 22  Temp: 36.8 C 37.3 C  SpO2: 94% 94%    Last Pain:  Vitals:   04/26/17 1603  TempSrc: Oral  PainSc:                  [name]

## 2017-04-27 ENCOUNTER — Encounter (HOSPITAL_COMMUNITY): Payer: Self-pay | Admitting: General Surgery

## 2017-04-27 LAB — GLUCOSE, CAPILLARY
Glucose-Capillary: 121 mg/dL — ABNORMAL HIGH (ref 65–99)
Glucose-Capillary: 114 mg/dL — ABNORMAL HIGH (ref 65–99)
Glucose-Capillary: 126 mg/dL — ABNORMAL HIGH (ref 65–99)
Glucose-Capillary: 95 mg/dL (ref 65–99)
Glucose-Capillary: 90 mg/dL (ref 65–99)

## 2017-04-27 LAB — BASIC METABOLIC PANEL
Anion gap: 8 (ref 5–15)
BUN: 22 mg/dL — ABNORMAL HIGH (ref 6–20)
CO2: 24 mmol/L (ref 22–32)
Calcium: 8.7 mg/dL — ABNORMAL LOW (ref 8.9–10.3)
Chloride: 104 mmol/L (ref 101–111)
Creatinine, Ser: 1.75 mg/dL — ABNORMAL HIGH (ref 0.61–1.24)
GFR calc Af Amer: 48 mL/min — ABNORMAL LOW (ref >60)
GFR calc non Af Amer: 41 mL/min — ABNORMAL LOW (ref >60)
Glucose, Bld: 119 mg/dL — ABNORMAL HIGH (ref 65–99)
Potassium: 4.5 mmol/L (ref 3.5–5.1)
Sodium: 136 mmol/L (ref 135–145)

## 2017-04-27 LAB — CBC
HCT: 38.5 % — ABNORMAL LOW (ref 39.0–52.0)
Hemoglobin: 12.6 g/dL — ABNORMAL LOW (ref 13.0–17.0)
MCH: 28.5 pg (ref 26.0–34.0)
MCHC: 32.7 g/dL (ref 30.0–36.0)
MCV: 87.1 fL (ref 78.0–100.0)
Platelets: 232 10*3/uL (ref 150–400)
RBC: 4.42 MIL/uL (ref 4.22–5.81)
RDW: 14.4 % (ref 11.5–15.5)
WBC: 9.4 10*3/uL (ref 4.0–10.5)

## 2017-04-27 MED ORDER — INFLUENZA VAC SPLIT QUAD 0.5 ML IM SUSY
0.5000 mL | PREFILLED_SYRINGE | INTRAMUSCULAR | Status: AC
  Administered 2017-04-29: 0.5 mL via INTRAMUSCULAR
  Filled 2017-04-27: qty 0.5

## 2017-04-27 MED ORDER — PNEUMOCOCCAL VAC POLYVALENT 25 MCG/0.5ML IJ INJ: 0.5000 mL | INJECTION | INTRAMUSCULAR | Status: DC

## 2017-04-27 MED ORDER — KCL IN DEXTROSE-NACL 20-5-0.45 MEQ/L-%-% IV SOLN
INTRAVENOUS | Status: DC
  Administered 2017-04-27 – 2017-04-28 (×2): via INTRAVENOUS
  Filled 2017-04-27 (×2): qty 1000

## 2017-04-27 NOTE — Progress Notes (Signed)
1 Day Post-Op   Subjective/Chief Complaint: Pain controlled, having flatus and bms, voiding fine   Objective: Vital signs in last 24 hours: Temp:  [97.5 F (36.4 C)-99.1 F (37.3 C)] 98.1 F (36.7 C) (12/02 0517) Pulse Rate:  [76-101] 76 (12/02 0517) Resp:  [16-24] 20 (12/02 0517) BP: (122-151)/(69-96) 122/72 (12/02 0517) SpO2:  [92 %-99 %] 92 % (12/02 0517) FiO2 (%):  [28 %] 28 % (12/01 1528) Last BM Date: 04/26/17  Intake/Output from previous day: 12/01 0701 - 12/02 0700 In: 2609.4 [I.V.:1610.4; IV Piggyback:999] Out: 300 [Emesis/NG output:250; Blood:50] Intake/Output this shift: No intake/output data recorded.  Resp: clear to auscultation bilaterally Cardio: regular rate and rhythm GI: soft approp tender dressing dry bs present  Lab Results:  Recent Labs    04/26/17 0425 04/27/17 0429  WBC 12.2* 9.4  HGB 13.6 12.6*  HCT 41.3 38.5*  PLT 266 232   BMET Recent Labs    04/26/17 0425 04/27/17 0429  NA 134* 136  K 4.7 4.5  CL 100* 104  CO2 22 24  GLUCOSE 165* 119*  BUN 19 22*  CREATININE 1.98* 1.75*  CALCIUM 9.1 8.7*   PT/INR No results for input(s): LABPROT, INR in the last 72 hours. ABG No results for input(s): PHART, HCO3 in the last 72 hours.  Invalid input(s): PCO2, PO2  Studies/Results: Ct Abdomen Pelvis W Contrast  Result Date: 04/26/2017 CLINICAL DATA:  Centralized abdominal pain EXAM: CT ABDOMEN AND PELVIS WITH CONTRAST TECHNIQUE: Multidetector CT imaging of the abdomen and pelvis was performed using the standard protocol following bolus administration of intravenous contrast. CONTRAST:  55mL ISOVUE-300 IOPAMIDOL (ISOVUE-300) INJECTION 61% COMPARISON:  07/15/2016 FINDINGS: Lower chest: Lung bases are clear. No effusions. Heart is normal size. Hepatobiliary: Increased echotexture compatible with fatty infiltration. No focal abnormality or biliary ductal dilatation. Pancreas: No focal abnormality or ductal dilatation. Spleen: No focal abnormality.   Normal size. Adrenals/Urinary Tract: Mild right hydronephrosis due to an 8 mm proximal right ureteral stone. Multiple nonobstructing right renal stones, the largest in the midpole measuring 11 mm. Small nonobstructing punctate stone in the lower pole of the left kidney. Small exophytic cyst off the lower pole of the right kidney, stable. Adrenal glands and urinary bladder unremarkable. Stomach/Bowel: There is a supraumbilical hernia containing small bowel loops. Small bowel loops proximal to the hernia are dilated compatible with small bowel obstruction. Distal small bowel loops are decompressed. Scattered sigmoid diverticula. No active diverticulitis. Vascular/Lymphatic: No evidence of aneurysm or adenopathy. Reproductive: No visible focal abnormality. Other: No free fluid or free air. Musculoskeletal: No acute bony abnormality. IMPRESSION: Supraumbilical midline ventral hernia containing small bowel loops with associated moderate small bowel obstruction. 8 mm proximal right ureteral stone with mild right hydronephrosis and delayed excretion of contrast from the right kidney. Fatty infiltration of the liver. Electronically Signed   By: Rolm Baptise M.D.   On: 04/26/2017 10:25    Anti-infectives: Anti-infectives (From admission, onward)   Start     Dose/Rate Route Frequency Ordered Stop   04/26/17 1400  cefoTEtan in Dextrose 5% (CEFOTAN) IVPB 2 g     2 g Intravenous To ShortStay Surgical 04/26/17 1253 04/26/17 1351   04/26/17 1254  cefoTEtan in Dextrose 5% (CEFOTAN) 2-2.08 GM-%(50ML) IVPB    Comments:  Henrine Screws   : cabinet override      04/26/17 1254 04/26/17 1351      Assessment/Plan: POD 1 primary repair incarcerated vh- thompson  -cont iv pain meds until taking po -pulm  toilet -will dc ng and give clears today -continue ssi for now -recheck cr in am, was increased but improving and voiding well -lovenox scds   Rolm Bookbinder 04/27/2017

## 2017-04-28 LAB — GLUCOSE, CAPILLARY
Glucose-Capillary: 106 mg/dL — ABNORMAL HIGH (ref 65–99)
Glucose-Capillary: 108 mg/dL — ABNORMAL HIGH (ref 65–99)
Glucose-Capillary: 117 mg/dL — ABNORMAL HIGH (ref 65–99)
Glucose-Capillary: 132 mg/dL — ABNORMAL HIGH (ref 65–99)
Glucose-Capillary: 138 mg/dL — ABNORMAL HIGH (ref 65–99)
Glucose-Capillary: 81 mg/dL (ref 65–99)

## 2017-04-28 LAB — CBC
HCT: 37.3 % — ABNORMAL LOW (ref 39.0–52.0)
Hemoglobin: 12 g/dL — ABNORMAL LOW (ref 13.0–17.0)
MCH: 28.2 pg (ref 26.0–34.0)
MCHC: 32.2 g/dL (ref 30.0–36.0)
MCV: 87.6 fL (ref 78.0–100.0)
Platelets: 225 10*3/uL (ref 150–400)
RBC: 4.26 MIL/uL (ref 4.22–5.81)
RDW: 14.6 % (ref 11.5–15.5)
WBC: 10.5 10*3/uL (ref 4.0–10.5)

## 2017-04-28 LAB — BASIC METABOLIC PANEL
Anion gap: 8 (ref 5–15)
BUN: 22 mg/dL — ABNORMAL HIGH (ref 6–20)
CO2: 26 mmol/L (ref 22–32)
Calcium: 8.6 mg/dL — ABNORMAL LOW (ref 8.9–10.3)
Chloride: 100 mmol/L — ABNORMAL LOW (ref 101–111)
Creatinine, Ser: 1.92 mg/dL — ABNORMAL HIGH (ref 0.61–1.24)
GFR calc Af Amer: 43 mL/min — ABNORMAL LOW (ref >60)
GFR calc non Af Amer: 37 mL/min — ABNORMAL LOW (ref >60)
Glucose, Bld: 132 mg/dL — ABNORMAL HIGH (ref 65–99)
Potassium: 4 mmol/L (ref 3.5–5.1)
Sodium: 134 mmol/L — ABNORMAL LOW (ref 135–145)

## 2017-04-28 MED ORDER — SODIUM CHLORIDE 0.9 % IV SOLN
INTRAVENOUS | Status: DC
  Administered 2017-04-28 – 2017-04-29 (×3): via INTRAVENOUS

## 2017-04-28 NOTE — Progress Notes (Signed)
CPAP at bedside. Patient states he will place self on and off when ready. Rt filled water chamber with sterile water.

## 2017-04-28 NOTE — Progress Notes (Signed)
[facility]/Trauma Progress Note  2 Days Post-Op   Assessment/Plan Hx of PE 2013 OSA on CPAP Obesity DM type II  Incarcerated recurrent ventral hernia - S/P repair incarcerated hernia repair ventral, Dr. Grandville Silos, 12/01 AKI - appears baseline is around 0.87-1.00 - creatinine up to 1.92 from 1.75 yesterday (12/2)  FEN: fulls VTE: SCD's, lovenox ID: none currently Follow up: Dr. Grandville Silos 2 weeks  DISPO: advance to fulls. Changed IVF to NS and Increased rate. Am labs to check creatinine     LOS: 2 days    Subjective:  CC: "does not feel good"  Slightly nauseated. States he just doesn't feel good. He states he felt better yesterday. Having lots of flatus. No vomiting. Tolerating clears. Mild abdominal pain, well controlled.   Objective: Vital signs in last 24 hours: Temp:  [98.2 F (36.8 C)-99.2 F (37.3 C)] 99.1 F (37.3 C) (12/03 0440) Pulse Rate:  [80-88] 88 (12/03 0440) Resp:  [16-20] 18 (12/03 0440) BP: (119-135)/(69-90) 119/70 (12/03 0440) SpO2:  [93 %-98 %] 93 % (12/03 0440) Last BM Date: 04/27/17  Intake/Output from previous day: 12/02 0701 - 12/03 0700 In: 2461.3 [I.V.:2461.3] Out: 450 [Urine:450] Intake/Output this shift: No intake/output data recorded.  PE: Gen:  Alert, NAD, pleasant, cooperative Card:  RRR, no M/G/R heard Pulm:  Rate and effort normal Abd: Soft, obese, not distended, +BS, incisions C/D/I, very mild TTP around incision, no guarding Skin: no rashes noted, warm and dry   Anti-infectives: Anti-infectives (From admission, onward)   Start     Dose/Rate Route Frequency Ordered Stop   04/26/17 1400  cefoTEtan in Dextrose 5% (CEFOTAN) IVPB 2 g     2 g Intravenous To ShortStay Surgical 04/26/17 1253 04/26/17 1351   04/26/17 1254  cefoTEtan in Dextrose 5% (CEFOTAN) 2-2.08 GM-%(50ML) IVPB    Comments:  Henrine Screws   : cabinet override      04/26/17 1254 04/26/17 1351      Lab Results:  Recent Labs    04/27/17 0429  04/28/17 0405  WBC 9.4 10.5  HGB 12.6* 12.0*  HCT 38.5* 37.3*  PLT 232 225   BMET Recent Labs    04/27/17 0429 04/28/17 0405  NA 136 134*  K 4.5 4.0  CL 104 100*  CO2 24 26  GLUCOSE 119* 132*  BUN 22* 22*  CREATININE 1.75* 1.92*  CALCIUM 8.7* 8.6*   PT/INR No results for input(s): LABPROT, INR in the last 72 hours. CMP     Component Value Date/Time   NA 134 (L) 04/28/2017 0405   K 4.0 04/28/2017 0405   CL 100 (L) 04/28/2017 0405   CO2 26 04/28/2017 0405   GLUCOSE 132 (H) 04/28/2017 0405   BUN 22 (H) 04/28/2017 0405   CREATININE 1.92 (H) 04/28/2017 0405   CALCIUM 8.6 (L) 04/28/2017 0405   PROT 7.7 04/26/2017 0425   ALBUMIN 3.6 04/26/2017 0425   AST 16 04/26/2017 0425   ALT 14 (L) 04/26/2017 0425   ALKPHOS 75 04/26/2017 0425   BILITOT 1.2 04/26/2017 0425   GFRNONAA 37 (L) 04/28/2017 0405   GFRAA 43 (L) 04/28/2017 0405   Lipase     Component Value Date/Time   LIPASE 19 04/26/2017 0425    Studies/Results: Ct Abdomen Pelvis W Contrast  Result Date: 04/26/2017 CLINICAL DATA:  Centralized abdominal pain EXAM: CT ABDOMEN AND PELVIS WITH CONTRAST TECHNIQUE: Multidetector CT imaging of the abdomen and pelvis was performed using the standard protocol following bolus administration of intravenous contrast. CONTRAST:  38mL ISOVUE-300 IOPAMIDOL (ISOVUE-300) INJECTION 61% COMPARISON:  07/15/2016 FINDINGS: Lower chest: Lung bases are clear. No effusions. Heart is normal size. Hepatobiliary: Increased echotexture compatible with fatty infiltration. No focal abnormality or biliary ductal dilatation. Pancreas: No focal abnormality or ductal dilatation. Spleen: No focal abnormality.  Normal size. Adrenals/Urinary Tract: Mild right hydronephrosis due to an 8 mm proximal right ureteral stone. Multiple nonobstructing right renal stones, the largest in the midpole measuring 11 mm. Small nonobstructing punctate stone in the lower pole of the left kidney. Small exophytic cyst off the lower  pole of the right kidney, stable. Adrenal glands and urinary bladder unremarkable. Stomach/Bowel: There is a supraumbilical hernia containing small bowel loops. Small bowel loops proximal to the hernia are dilated compatible with small bowel obstruction. Distal small bowel loops are decompressed. Scattered sigmoid diverticula. No active diverticulitis. Vascular/Lymphatic: No evidence of aneurysm or adenopathy. Reproductive: No visible focal abnormality. Other: No free fluid or free air. Musculoskeletal: No acute bony abnormality. IMPRESSION: Supraumbilical midline ventral hernia containing small bowel loops with associated moderate small bowel obstruction. 8 mm proximal right ureteral stone with mild right hydronephrosis and delayed excretion of contrast from the right kidney. Fatty infiltration of the liver. Electronically Signed   By: Rolm Baptise M.D.   On: 04/26/2017 10:25      Kalman Drape , [facility] 04/28/2017, 8:41 AM Pager: [phone] Consults: [phone] Mon-Fri 7:00 am-4:30 pm Sat-Sun 7:00 am-11:30 am

## 2017-04-29 LAB — CBC
HCT: 35.1 % — ABNORMAL LOW (ref 39.0–52.0)
Hemoglobin: 11.4 g/dL — ABNORMAL LOW (ref 13.0–17.0)
MCH: 27.9 pg (ref 26.0–34.0)
MCHC: 32.5 g/dL (ref 30.0–36.0)
MCV: 85.8 fL (ref 78.0–100.0)
Platelets: 232 10*3/uL (ref 150–400)
RBC: 4.09 MIL/uL — ABNORMAL LOW (ref 4.22–5.81)
RDW: 13.9 % (ref 11.5–15.5)
WBC: 9.5 10*3/uL (ref 4.0–10.5)

## 2017-04-29 LAB — BASIC METABOLIC PANEL
Anion gap: 10 (ref 5–15)
BUN: 16 mg/dL (ref 6–20)
CO2: 24 mmol/L (ref 22–32)
Calcium: 8.5 mg/dL — ABNORMAL LOW (ref 8.9–10.3)
Chloride: 101 mmol/L (ref 101–111)
Creatinine, Ser: 1.42 mg/dL — ABNORMAL HIGH (ref 0.61–1.24)
GFR calc Af Amer: >60 mL/min (ref >60)
GFR calc non Af Amer: 53 mL/min — ABNORMAL LOW (ref >60)
Glucose, Bld: 120 mg/dL — ABNORMAL HIGH (ref 65–99)
Potassium: 3.6 mmol/L (ref 3.5–5.1)
Sodium: 135 mmol/L (ref 135–145)

## 2017-04-29 LAB — GLUCOSE, CAPILLARY
Glucose-Capillary: 125 mg/dL — ABNORMAL HIGH (ref 65–99)
Glucose-Capillary: 111 mg/dL — ABNORMAL HIGH (ref 65–99)

## 2017-04-29 MED ORDER — OXYCODONE HCL 5 MG PO TABS: 5.0000 mg | ORAL_TABLET | ORAL | 0 refills | Status: DC | PRN

## 2017-04-29 MED ORDER — OXYCODONE HCL 5 MG PO TABS: 5.0000 mg | ORAL_TABLET | ORAL | Status: DC | PRN

## 2017-04-29 MED ORDER — APIXABAN 5 MG PO TABS
5.0000 mg | ORAL_TABLET | Freq: Two times a day (BID) | ORAL | Status: DC
  Administered 2017-04-29: 5 mg via ORAL
  Filled 2017-04-29: qty 1

## 2017-04-29 NOTE — Progress Notes (Signed)
Discharge instructions (including medications) discussed with and copy provided to patient/caregiver along with doctor's note for work and prescription for pain med.

## 2017-04-29 NOTE — Discharge Summary (Signed)
[facility]/Trauma Discharge Summary   Patient ID: Evan Leonard MRN: [id number] DOB/AGE: June 06, 1960 56 y.o.  Admit date: 04/26/2017 Discharge date: 04/29/2017  Admitting Diagnosis: Incarcerated ventral hernia  Discharge Diagnosis Patient Active Problem List   Diagnosis Date Noted  . S/P repair of recurrent ventral hernia 04/26/2017  . Bowel obstruction ([place]) 07/16/2016  . Chronic venous insufficiency 01/25/2015  . Diabetes mellitus with hyperglycemia ([place]) 03/31/2013  . Hyponatremia 03/23/2013  . Pulmonary embolism ([place]) 05/05/2012  . Morbid obesity with body mass index of 40.0-44.9 in adult [facility]) 05/05/2012  . Obstructive sleep apnea 05/05/2012  . DVT of left distal popliteal vein 05/05/2012    Consultants none  Imaging: No results found.  Procedures Dr. Grandville Silos (04/26/17) - Repair incarcerated ventral hernia  HPI: Asked to see patient at the request of Dr. Leonette Monarch for incarcerated ventral hernia.  The patient has multiple medical problems.  Has a history of repair of an incarcerated umbilical hernia by Dr. Hulen Skains in 2017 which was done without mesh.  He developed a recurrent hernia recently and yesterday developed redness and swelling above the umbilicus.  He now has nausea and vomiting.  CT scan shows small bowel obstruction with an incarcerated recurrent ventral hernia.  He is on Eliquis for history of DVT.  He also has a history of pulmonary embolus as well.  He skipped his Eliquis yesterday.  The redness and pain increased yesterday around his umbilicus.  Hospital Course:  Patient was admitted and underwent procedure listed above.  Tolerated procedure well and was transferred to the floor.  Diet was advanced as tolerated.  Pt appeared to have an AKI with an elevated  Creatinine. Pt was on IVF. On POD#3, the patient was voiding well, tolerating diet, ambulating well, pain well controlled, vital signs stable, incisions c/d/i, creatinine trending down and felt  stable for discharge home. Pt was restarted on his Eliquis prior to discharge. Patient will follow up in our office in 2 weeks and knows to call with questions or concerns.  He will call to confirm appointment date/time.    Patient was discharged in good condition.  The [place] Substance controlled database was reviewed prior to prescribing narcotic pain medication to this patient.  Physical Exam: Gen:  Alert, NAD, pleasant, cooperative Card:  RRR, no M/G/R heard Pulm:  CTA b/l, no wheezes or rales, rate and effort normal Abd: Soft, obese, not distended, +BS, incision with staples intact and no surrounding erythema or drainage, very mild TTP of left side of abdomen, no guarding Skin: no rashes noted, warm and dry   Allergies as of 04/29/2017   No Known Allergies     Medication List    TAKE these medications   ELIQUIS 5 MG Tabs tablet Generic drug:  apixaban TAKE 1 TABLET TWICE A DAY   furosemide 40 MG tablet Commonly known as:  LASIX Take 1 tablet (40 mg total) by mouth daily.   metFORMIN 500 MG tablet Commonly known as:  GLUCOPHAGE TAKE 2 TABLETS (1,000 MG TOTAL) BY MOUTH 2 (TWO) TIMES DAILY WITH A MEAL.   multivitamin with minerals Tabs tablet Take 1 tablet by mouth daily.   ondansetron 4 MG disintegrating tablet Commonly known as:  ZOFRAN ODT Take 1 tablet (4 mg total) by mouth every 8 (eight) hours as needed for nausea or vomiting.   OVER THE COUNTER MEDICATION Apply 1 application topically daily. Medication: Back Balm. Patient states he apply to his legs every day.   oxyCODONE 5 MG immediate  release tablet Commonly known as:  Oxy IR/ROXICODONE Take 1 tablet (5 mg total) by mouth every 4 (four) hours as needed for moderate pain or severe pain (5mg  for moderate pain, 10mg  for severe pain).   senna-docusate 8.6-50 MG tablet Commonly known as:  SENOKOT S Take 1 tablet by mouth at bedtime as needed for mild constipation. Also available OTC       Follow-up  [facility], [place]. Call.   Specialty:  General Surgery Why:  call our office to see when your follow up appoitnment is. Please arrive 65min prior to complete paperwork Contact information: [address] [place] [phone]           Signed: [facility] 04/29/2017, 9:01 AM Pager: [phone] Consults: [phone] Mon-Fri 7:00 am-4:30 pm Sat-Sun 7:00 am-11:30 am

## 2017-04-29 NOTE — Progress Notes (Signed)
Pt tolerated soft diet well, consumed 100% of his meal.

## 2017-04-29 NOTE — Discharge Instructions (Signed)
[facility] _______[facility]  UMBILICAL OR INGUINAL HERNIA REPAIR: POST OP INSTRUCTIONS  Always review your discharge instruction sheet given to you by the facility where your surgery was performed. IF YOU HAVE DISABILITY OR FAMILY LEAVE FORMS, YOU MUST BRING THEM TO THE OFFICE FOR PROCESSING.   DO NOT GIVE THEM TO YOUR DOCTOR.  1. A  prescription for pain medication may be given to you upon discharge.  Take your pain medication as prescribed, if needed.  If narcotic pain medicine is not needed, then you may take acetaminophen (Tylenol) or ibuprofen (Advil) as needed. 2. Take your usually prescribed medications unless otherwise directed. If you need a refill on your pain medication, please contact your pharmacy.  They will contact our office to request authorization. Prescriptions will not be filled after 5 pm or on week-ends. 3. You should follow a light diet the first 24 hours after arrival home, such as soup and crackers, etc.  Be sure to include lots of fluids daily.  Resume your normal diet the day after surgery. 4.Most patients will experience some swelling and bruising around the umbilicus or in the groin and scrotum.  Ice packs and reclining will help.  Swelling and bruising can take several days to resolve.  6. It is common to experience some constipation if taking pain medication after surgery.  Increasing fluid intake and taking a stool softener (such as Colace) will usually help or prevent this problem from occurring.  A mild laxative (Milk of Magnesia or Miralax) should be taken according to package directions if there are no bowel movements after 48 hours. 7. Unless discharge instructions indicate otherwise, you may remove your bandages 24-48 hours after surgery, and you may shower at that time.  You may have steri-strips (small skin tapes) in place directly over the incision.  These strips should be left on the skin for 7-10 days.  If your surgeon used skin glue on the  incision, you may shower in 24 hours.  The glue will flake off over the next 2-3 weeks.  Any sutures or staples will be removed at the office during your follow-up visit. 8. ACTIVITIES:  You may resume regular (light) daily activities beginning the next day--such as daily self-care, walking, climbing stairs--gradually increasing activities as tolerated.  You may have sexual intercourse when it is comfortable.  Refrain from any heavy lifting or straining until approved by your doctor.  a.You may drive when you are no longer taking prescription pain medication, you can comfortably wear a seatbelt, and you can safely maneuver your car and apply brakes. b.RETURN TO WORK:   2 weeks after surgery with the restrictions of no lifting >20lbs for 6-8 weeks  9.You should see your doctor in the office for a follow-up appointment approximately 2-3 weeks after your surgery.  Make sure that you call for this appointment within a day or two after you arrive home to insure a convenient appointment time. 10.OTHER INSTRUCTIONS: _________________________    _____________________________________  WHEN TO CALL YOUR DOCTOR: 1. Fever over 101.0 2. Inability to urinate 3. Nausea and/or vomiting 4. Extreme swelling or bruising 5. Continued bleeding from incision. 6. Increased pain, redness, or drainage from the incision  The clinic staff is available to answer your questions during regular business hours.  Please dont hesitate to call and ask to speak to one of the nurses for clinical concerns.  If you have a medical emergency, go to the nearest emergency room or call 911.  A surgeon  from [facility] is always on call at the hospital   [address] [place], [place], [place]  [address] ?  [address], [place], [place]   [address] [phone] ? [phone] ? FAX [phone] Web site: [email or web address]  Soft-Food Meal Plan Follow for 3-4 days after discharge A soft-food meal  plan includes foods that are safe and easy to swallow. This meal plan typically is used:  If you are having trouble chewing or swallowing foods.  As a transition meal plan after only having had liquid meals for a long period.  What do I need to know about the soft-food meal plan? A soft-food meal plan includes tender foods that are soft and easy to chew and swallow. In most cases, bite-sized pieces of food are easier to swallow. A bite-sized piece is about  inch or smaller. Foods in this plan do not need to be ground or pureed. Foods that are very hard, crunchy, or sticky should be avoided. Also, breads, cereals, yogurts, and desserts with nuts, seeds, or fruits should be avoided. What foods can I eat? Grains Rice and wild rice. Moist bread, dressing, pasta, and noodles. Well-moistened dry or cooked cereals, such as farina (cooked wheat cereal), oatmeal, or grits. Biscuits, breads, muffins, pancakes, and waffles that have been well moistened. Vegetables Shredded lettuce. Cooked, tender vegetables, including potatoes without skins. Vegetable juices. Broths or creamed soups made with vegetables that are not stringy or chewy. Strained tomatoes (without seeds). Fruits Canned or well-cooked fruits. Soft (ripe), peeled fresh fruits, such as peaches, nectarines, kiwi, cantaloupe, honeydew melon, and watermelon (without seeds). Soft berries with small seeds, such as strawberries. Fruit juices (without pulp). Meats and Other Protein Sources Moist, tender, lean beef. Mutton. Lamb. Veal. Chicken. [place]. Liver. Ham. Fish without bones. Eggs. Dairy Milk, milk drinks, and cream. Plain cream cheese and cottage cheese. Plain yogurt. Sweets/Desserts Flavored gelatin desserts. Custard. Plain ice cream, frozen yogurt, sherbet, milk shakes, and malts. Plain cakes and cookies. Plain hard candy. Other Butter, margarine (without trans fat), and cooking oils. Mayonnaise. Cream sauces. Mild spices, salt, and sugar.  Syrup, molasses, honey, and jelly. The items listed above may not be a complete list of recommended foods or beverages. Contact your dietitian for more options. What foods are not recommended? Grains Dry bread, toast, crackers that have not been moistened. Coarse or dry cereals, such as bran, granola, and shredded wheat. Tough or chewy crusty breads, such as [place] bread or baguettes. Vegetables Corn. Raw vegetables except shredded lettuce. Cooked vegetables that are tough or stringy. Tough, crisp, fried potatoes and potato skins. Fruits Fresh fruits with skins or seeds or both, such as apples, pears, or grapes. Stringy, high-pulp fruits, such as papaya, pineapple, coconut, or mango. Fruit leather, fruit roll-ups, and all dried fruits. Meats and Other Protein Sources Sausages and hot dogs. Meats with gristle. Fish with bones. Nuts, seeds, and chunky peanut or other nut butters. Sweets/Desserts Cakes or cookies that are very dry or chewy. The items listed above may not be a complete list of foods and beverages to avoid. Contact your dietitian for more information. This information is not intended to replace advice given to you by your health care provider. Make sure you discuss any questions you have with your health care provider. Document Released: 08/20/2007 Document Revised: 10/19/2015 Document Reviewed: 04/09/2013 Elsevier Interactive Patient Education  2017 [place] on my medicine - ELIQUIS (apixaban)  Why was Eliquis prescribed for you? Eliquis was  prescribed for you to reduce the risk of forming blood clots due to your history of PE and DVT.  What do You need to know about Eliquis ? Take your Eliquis TWICE DAILY - one tablet in the morning and one tablet in the evening with or without food.  It would be best to take the doses about the same time each day.  If you have difficulty swallowing the tablet whole please discuss with your pharmacist how to take the  medication safely.  Take Eliquis exactly as prescribed by your doctor and DO NOT stop taking Eliquis without talking to the doctor who prescribed the medication.  Stopping may increase your risk of developing a new clot or stroke.  Refill your prescription before you run out.  After discharge, you should have regular check-up appointments with your healthcare provider that is prescribing your Eliquis.  In the future your dose may need to be changed if your kidney function or weight changes by a significant amount or as you get older.  What do you do if you miss a dose? If you miss a dose, take it as soon as you remember on the same day and resume taking twice daily.  Do not take more than one dose of ELIQUIS at the same time.  Important Safety Information A possible side effect of Eliquis is bleeding. You should call your healthcare provider right away if you experience any of the following: ? Bleeding from an injury or your nose that does not stop. ? Unusual colored urine (red or dark brown) or unusual colored stools (red or black). ? Unusual bruising for unknown reasons. ? A serious fall or if you hit your head (even if there is no bleeding).  Some medicines may interact with Eliquis and might increase your risk of bleeding or clotting while on Eliquis. To help avoid this, consult your healthcare provider or pharmacist prior to using any new prescription or non-prescription medications, including herbals, vitamins, non-steroidal anti-inflammatory drugs (NSAIDs) and supplements.  This website has more information on Eliquis (apixaban): [email or web address].no.

## 2017-05-12 ENCOUNTER — Other Ambulatory Visit: Payer: Self-pay

## 2017-05-12 ENCOUNTER — Ambulatory Visit (INDEPENDENT_AMBULATORY_CARE_PROVIDER_SITE_OTHER)
Admission: EM | Admit: 2017-05-12 | Discharge: 2017-05-12 | Disposition: A | Discharge status: Home / Self Care | Payer: PRIVATE HEALTH INSURANCE | Source: Home / Self Care | Attending: Family Medicine | Admitting: Family Medicine

## 2017-05-12 ENCOUNTER — Emergency Department (HOSPITAL_COMMUNITY): Payer: PRIVATE HEALTH INSURANCE | Admitting: Anesthesiology

## 2017-05-12 ENCOUNTER — Inpatient Hospital Stay (HOSPITAL_COMMUNITY)
Admission: EM | Admit: 2017-05-12 | Discharge: 2017-05-15 | DRG: 854 | Disposition: A | Discharge status: Home / Self Care | Payer: PRIVATE HEALTH INSURANCE | Attending: Family Medicine | Admitting: Family Medicine

## 2017-05-12 ENCOUNTER — Other Ambulatory Visit (HOSPITAL_COMMUNITY)
Admission: RE | Admit: 2017-05-12 | Discharge: 2017-05-12 | Disposition: A | Discharge status: Home / Self Care | Payer: PRIVATE HEALTH INSURANCE | Source: Ambulatory Visit | Attending: Family Medicine | Admitting: Family Medicine

## 2017-05-12 ENCOUNTER — Encounter (HOSPITAL_COMMUNITY): Payer: Self-pay | Admitting: Emergency Medicine

## 2017-05-12 ENCOUNTER — Emergency Department (HOSPITAL_COMMUNITY): Payer: PRIVATE HEALTH INSURANCE

## 2017-05-12 ENCOUNTER — Encounter (HOSPITAL_COMMUNITY)
Admission: EM | Disposition: A | Discharge status: Home / Self Care | Payer: Self-pay | Source: Home / Self Care | Attending: Family Medicine

## 2017-05-12 ENCOUNTER — Ambulatory Visit (HOSPITAL_COMMUNITY): Payer: PRIVATE HEALTH INSURANCE

## 2017-05-12 DIAGNOSIS — N10 Acute tubulo-interstitial nephritis: Secondary | ICD-10-CM

## 2017-05-12 DIAGNOSIS — R000 Tachycardia, unspecified: Secondary | ICD-10-CM

## 2017-05-12 DIAGNOSIS — E1151 Type 2 diabetes mellitus with diabetic peripheral angiopathy without gangrene: Secondary | ICD-10-CM | POA: Diagnosis present

## 2017-05-12 DIAGNOSIS — R110 Nausea: Secondary | ICD-10-CM

## 2017-05-12 DIAGNOSIS — Z419 Encounter for procedure for purposes other than remedying health state, unspecified: Secondary | ICD-10-CM

## 2017-05-12 DIAGNOSIS — Z7901 Long term (current) use of anticoagulants: Secondary | ICD-10-CM | POA: Diagnosis not present

## 2017-05-12 DIAGNOSIS — G4733 Obstructive sleep apnea (adult) (pediatric): Secondary | ICD-10-CM | POA: Diagnosis present

## 2017-05-12 DIAGNOSIS — K439 Ventral hernia without obstruction or gangrene: Secondary | ICD-10-CM | POA: Diagnosis not present

## 2017-05-12 DIAGNOSIS — R509 Fever, unspecified: Secondary | ICD-10-CM

## 2017-05-12 DIAGNOSIS — N1330 Unspecified hydronephrosis: Secondary | ICD-10-CM | POA: Diagnosis not present

## 2017-05-12 DIAGNOSIS — N12 Tubulo-interstitial nephritis, not specified as acute or chronic: Secondary | ICD-10-CM

## 2017-05-12 DIAGNOSIS — Z87442 Personal history of urinary calculi: Secondary | ICD-10-CM

## 2017-05-12 DIAGNOSIS — K760 Fatty (change of) liver, not elsewhere classified: Secondary | ICD-10-CM | POA: Diagnosis present

## 2017-05-12 DIAGNOSIS — Z7984 Long term (current) use of oral hypoglycemic drugs: Secondary | ICD-10-CM

## 2017-05-12 DIAGNOSIS — Z6841 Body Mass Index (BMI) 40.0 and over, adult: Secondary | ICD-10-CM | POA: Diagnosis not present

## 2017-05-12 DIAGNOSIS — Z86711 Personal history of pulmonary embolism: Secondary | ICD-10-CM | POA: Diagnosis not present

## 2017-05-12 DIAGNOSIS — Z833 Family history of diabetes mellitus: Secondary | ICD-10-CM

## 2017-05-12 DIAGNOSIS — A419 Sepsis, unspecified organism: Principal | ICD-10-CM | POA: Diagnosis present

## 2017-05-12 DIAGNOSIS — Z79899 Other long term (current) drug therapy: Secondary | ICD-10-CM

## 2017-05-12 DIAGNOSIS — N35819 Other urethral stricture, male, unspecified site: Secondary | ICD-10-CM | POA: Diagnosis present

## 2017-05-12 DIAGNOSIS — Z86718 Personal history of other venous thrombosis and embolism: Secondary | ICD-10-CM

## 2017-05-12 DIAGNOSIS — N136 Pyonephrosis: Secondary | ICD-10-CM | POA: Diagnosis present

## 2017-05-12 DIAGNOSIS — Z8249 Family history of ischemic heart disease and other diseases of the circulatory system: Secondary | ICD-10-CM

## 2017-05-12 DIAGNOSIS — N200 Calculus of kidney: Secondary | ICD-10-CM | POA: Diagnosis not present

## 2017-05-12 DIAGNOSIS — R0682 Tachypnea, not elsewhere classified: Secondary | ICD-10-CM

## 2017-05-12 HISTORY — DX: Tubulo-interstitial nephritis, not specified as acute or chronic: N12

## 2017-05-12 HISTORY — PX: CYSTOSCOPY W/ URETERAL STENT PLACEMENT: SHX1429

## 2017-05-12 LAB — COMPREHENSIVE METABOLIC PANEL
ALT: 12 U/L — ABNORMAL LOW (ref 17–63)
AST: 15 U/L (ref 15–41)
Albumin: 2.8 g/dL — ABNORMAL LOW (ref 3.5–5.0)
Alkaline Phosphatase: 84 U/L (ref 38–126)
Anion gap: 10 (ref 5–15)
BUN: 13 mg/dL (ref 6–20)
CO2: 22 mmol/L (ref 22–32)
Calcium: 9 mg/dL (ref 8.9–10.3)
Chloride: 102 mmol/L (ref 101–111)
Creatinine, Ser: 1.4 mg/dL — ABNORMAL HIGH (ref 0.61–1.24)
GFR calc Af Amer: >60 mL/min (ref >60)
GFR calc non Af Amer: 54 mL/min — ABNORMAL LOW (ref >60)
Glucose, Bld: 146 mg/dL — ABNORMAL HIGH (ref 65–99)
Potassium: 4.1 mmol/L (ref 3.5–5.1)
Sodium: 134 mmol/L — ABNORMAL LOW (ref 135–145)
Total Bilirubin: 0.7 mg/dL (ref 0.3–1.2)
Total Protein: 7.5 g/dL (ref 6.5–8.1)

## 2017-05-12 LAB — CBC WITH DIFFERENTIAL/PLATELET
Basophils Absolute: 0 10*3/uL (ref 0.0–0.1)
Basophils Relative: 0 %
Eosinophils Absolute: 0 10*3/uL (ref 0.0–0.7)
Eosinophils Relative: 0 %
HCT: 39.5 % (ref 39.0–52.0)
Hemoglobin: 13 g/dL (ref 13.0–17.0)
Lymphocytes Relative: 8 %
Lymphs Abs: 1.4 10*3/uL (ref 0.7–4.0)
MCH: 28.3 pg (ref 26.0–34.0)
MCHC: 32.9 g/dL (ref 30.0–36.0)
MCV: 86.1 fL (ref 78.0–100.0)
Monocytes Absolute: 1.1 10*3/uL — ABNORMAL HIGH (ref 0.1–1.0)
Monocytes Relative: 6 %
Neutro Abs: 15.3 10*3/uL — ABNORMAL HIGH (ref 1.7–7.7)
Neutrophils Relative %: 86 %
Platelets: 378 10*3/uL (ref 150–400)
RBC: 4.59 MIL/uL (ref 4.22–5.81)
RDW: 13.8 % (ref 11.5–15.5)
WBC: 17.8 10*3/uL — ABNORMAL HIGH (ref 4.0–10.5)

## 2017-05-12 LAB — POCT URINALYSIS DIP (DEVICE)
Bilirubin Urine: NEGATIVE
Glucose, UA: NEGATIVE mg/dL
Ketones, ur: NEGATIVE mg/dL
Nitrite: POSITIVE — AB
Protein, ur: NEGATIVE mg/dL
Specific Gravity, Urine: 1.02 (ref 1.005–1.030)
Urobilinogen, UA: 1 mg/dL (ref 0.0–1.0)
pH: 5.5 (ref 5.0–8.0)

## 2017-05-12 LAB — I-STAT CG4 LACTIC ACID, ED
Lactic Acid, Venous: 1.85 mmol/L (ref 0.5–1.9)
Lactic Acid, Venous: 0.88 mmol/L (ref 0.5–1.9)

## 2017-05-12 LAB — GLUCOSE, CAPILLARY: Glucose-Capillary: 97 mg/dL (ref 65–99)

## 2017-05-12 SURGERY — CYSTOSCOPY, WITH RETROGRADE PYELOGRAM AND URETERAL STENT INSERTION
Anesthesia: General | Site: Bladder | Laterality: Right

## 2017-05-12 SURGERY — CYSTOSCOPY, WITH STENT INSERTION
Anesthesia: Choice | Laterality: Right

## 2017-05-12 MED ORDER — DEXTROSE 5 % IV SOLN
2.0000 g | Freq: Once | INTRAVENOUS | Status: AC
  Administered 2017-05-12: 2 g via INTRAVENOUS
  Filled 2017-05-12: qty 2

## 2017-05-12 MED ORDER — LIDOCAINE HCL 2 % EX GEL
CUTANEOUS | Status: AC
  Filled 2017-05-12: qty 20

## 2017-05-12 MED ORDER — ACETAMINOPHEN 650 MG RE SUPP: 650.0000 mg | Freq: Four times a day (QID) | RECTAL | Status: DC | PRN

## 2017-05-12 MED ORDER — MIDAZOLAM HCL 2 MG/2ML IJ SOLN
INTRAMUSCULAR | Status: AC
  Filled 2017-05-12: qty 2

## 2017-05-12 MED ORDER — FENTANYL CITRATE (PF) 250 MCG/5ML IJ SOLN
INTRAMUSCULAR | Status: AC
  Filled 2017-05-12: qty 5

## 2017-05-12 MED ORDER — LIDOCAINE 2% (20 MG/ML) 5 ML SYRINGE
INTRAMUSCULAR | Status: AC
  Filled 2017-05-12: qty 5

## 2017-05-12 MED ORDER — PROPOFOL 10 MG/ML IV BOLUS
INTRAVENOUS | Status: AC
  Filled 2017-05-12: qty 20

## 2017-05-12 MED ORDER — OXYCODONE HCL 5 MG PO TABS: 5.0000 mg | ORAL_TABLET | ORAL | Status: DC | PRN

## 2017-05-12 MED ORDER — IOPAMIDOL (ISOVUE-300) INJECTION 61%
INTRAVENOUS | Status: DC | PRN
  Administered 2017-05-12: 50 mL via INTRAVENOUS

## 2017-05-12 MED ORDER — ONDANSETRON HCL 4 MG/2ML IJ SOLN
4.0000 mg | Freq: Four times a day (QID) | INTRAMUSCULAR | Status: DC | PRN
Start: 2017-05-12 — End: 2017-05-15

## 2017-05-12 MED ORDER — LIDOCAINE HCL (CARDIAC) 20 MG/ML IV SOLN
INTRAVENOUS | Status: DC | PRN
  Administered 2017-05-12: 100 mg via INTRAVENOUS

## 2017-05-12 MED ORDER — ONDANSETRON 4 MG PO TBDP
4.0000 mg | ORAL_TABLET | Freq: Once | ORAL | Status: DC | PRN
  Filled 2017-05-12: qty 1

## 2017-05-12 MED ORDER — ONDANSETRON HCL 4 MG/2ML IJ SOLN
INTRAMUSCULAR | Status: DC | PRN
Start: 2017-05-12 — End: 2017-05-12
  Administered 2017-05-12: 4 mg via INTRAVENOUS

## 2017-05-12 MED ORDER — ONDANSETRON 4 MG PO TBDP
ORAL_TABLET | ORAL | Status: AC
  Filled 2017-05-12: qty 1

## 2017-05-12 MED ORDER — SCOPOLAMINE 1 MG/3DAYS TD PT72
MEDICATED_PATCH | TRANSDERMAL | Status: AC
  Filled 2017-05-12: qty 1

## 2017-05-12 MED ORDER — ONDANSETRON HCL 4 MG PO TABS: 4.0000 mg | ORAL_TABLET | Freq: Four times a day (QID) | ORAL | Status: DC | PRN

## 2017-05-12 MED ORDER — ONDANSETRON HCL 4 MG/2ML IJ SOLN
INTRAMUSCULAR | Status: AC
  Filled 2017-05-12: qty 2

## 2017-05-12 MED ORDER — HYDROMORPHONE HCL 1 MG/ML IJ SOLN: 0.2500 mg | INTRAMUSCULAR | Status: DC | PRN

## 2017-05-12 MED ORDER — PROMETHAZINE HCL 25 MG/ML IJ SOLN: 6.2500 mg | INTRAMUSCULAR | Status: DC | PRN

## 2017-05-12 MED ORDER — APIXABAN 5 MG PO TABS
5.0000 mg | ORAL_TABLET | Freq: Two times a day (BID) | ORAL | Status: DC
  Administered 2017-05-13 – 2017-05-15 (×5): 5 mg via ORAL
  Filled 2017-05-12 (×5): qty 1

## 2017-05-12 MED ORDER — SODIUM CHLORIDE 0.9 % IV SOLN
INTRAVENOUS | Status: DC
  Administered 2017-05-13 – 2017-05-14 (×3): via INTRAVENOUS

## 2017-05-12 MED ORDER — SENNOSIDES-DOCUSATE SODIUM 8.6-50 MG PO TABS: 1.0000 | ORAL_TABLET | Freq: Every evening | ORAL | Status: DC | PRN

## 2017-05-12 MED ORDER — SODIUM CHLORIDE 0.9 % IV BOLUS (SEPSIS)
1000.0000 mL | Freq: Once | INTRAVENOUS | Status: AC
  Administered 2017-05-12: 1000 mL via INTRAVENOUS

## 2017-05-12 MED ORDER — ACETAMINOPHEN 325 MG PO TABS: 650.0000 mg | ORAL_TABLET | Freq: Four times a day (QID) | ORAL | Status: DC | PRN

## 2017-05-12 MED ORDER — IOPAMIDOL (ISOVUE-300) INJECTION 61%
INTRAVENOUS | Status: AC
  Filled 2017-05-12: qty 50

## 2017-05-12 MED ORDER — ONDANSETRON 4 MG PO TBDP
4.0000 mg | ORAL_TABLET | Freq: Once | ORAL | Status: AC
  Administered 2017-05-12: 4 mg via ORAL

## 2017-05-12 MED ORDER — ACETAMINOPHEN 325 MG PO TABS
325.0000 mg | ORAL_TABLET | Freq: Once | ORAL | Status: AC
  Administered 2017-05-12: 325 mg via ORAL
  Filled 2017-05-12: qty 1

## 2017-05-12 MED ORDER — DEXTROSE 5 % IV SOLN
1.0000 g | INTRAVENOUS | Status: DC
  Administered 2017-05-13: 1 g via INTRAVENOUS
  Filled 2017-05-12 (×4): qty 10

## 2017-05-12 MED ORDER — PROPOFOL 10 MG/ML IV BOLUS
INTRAVENOUS | Status: DC | PRN
  Administered 2017-05-12: 200 mg via INTRAVENOUS

## 2017-05-12 MED ORDER — SODIUM CHLORIDE 0.9 % IV SOLN
INTRAVENOUS | Status: DC | PRN
  Administered 2017-05-12 (×2): via INTRAVENOUS

## 2017-05-12 MED ORDER — FENTANYL CITRATE (PF) 100 MCG/2ML IJ SOLN
INTRAMUSCULAR | Status: DC | PRN
  Administered 2017-05-12 (×3): 50 ug via INTRAVENOUS

## 2017-05-12 SURGICAL SUPPLY — 32 items
ADAPTER CATH URET PLST 4-6FR (CATHETERS) IMPLANT
BAG URINE DRAINAGE (UROLOGICAL SUPPLIES) ×2 IMPLANT
BAG URO CATCHER STRL LF (MISCELLANEOUS) ×2 IMPLANT
BENZOIN TINCTURE PRP APPL 2/3 (GAUZE/BANDAGES/DRESSINGS) IMPLANT
BLADE 10 SAFETY STRL DISP (BLADE) ×2 IMPLANT
BUCKET BIOHAZARD WASTE 5 GAL (MISCELLANEOUS) IMPLANT
CATH FOLEY 2WAY SLVR  5CC 16FR (CATHETERS)
CATH FOLEY 2WAY SLVR 5CC 16FR (CATHETERS) IMPLANT
CATH INTERMIT  6FR 70CM (CATHETERS) IMPLANT
CATH URET 5FR 28IN CONE TIP (BALLOONS)
CATH URET 5FR 70CM CONE TIP (BALLOONS) IMPLANT
COVER SURGICAL LIGHT HANDLE (MISCELLANEOUS) ×2 IMPLANT
DRAPE CAMERA CLOSED 9X96 (DRAPES) ×2 IMPLANT
GLOVE BIO SURGEON STRL SZ7.5 (GLOVE) ×2 IMPLANT
GOWN STRL REUS W/ TWL XL LVL3 (GOWN DISPOSABLE) ×2 IMPLANT
GOWN STRL REUS W/TWL XL LVL3 (GOWN DISPOSABLE) ×2
GUIDEWIRE ANG ZIPWIRE 038X150 (WIRE) IMPLANT
GUIDEWIRE COOK  .035 (WIRE) IMPLANT
GUIDEWIRE STR DUAL SENSOR (WIRE) ×2 IMPLANT
KIT ROOM TURNOVER OR (KITS) ×2 IMPLANT
MANIFOLD NEPTUNE II (INSTRUMENTS) IMPLANT
NS IRRIG 1000ML POUR BTL (IV SOLUTION) ×2 IMPLANT
PACK CYSTO (CUSTOM PROCEDURE TRAY) ×2 IMPLANT
PAD ARMBOARD 7.5X6 YLW CONV (MISCELLANEOUS) ×4 IMPLANT
PLUG CATH AND CAP STER (CATHETERS) IMPLANT
STENT URET 6FRX24 CONTOUR (STENTS) IMPLANT
STENT URET 6FRX26 CONTOUR (STENTS) ×2 IMPLANT
SYRINGE CONTROL L 12CC (SYRINGE) ×2 IMPLANT
SYRINGE TOOMEY DISP (SYRINGE) IMPLANT
UNDERPAD 30X30 (UNDERPADS AND DIAPERS) ×2 IMPLANT
WATER STERILE IRR 1000ML POUR (IV SOLUTION) ×2 IMPLANT
WIRE COONS/BENSON .038X145CM (WIRE) IMPLANT

## 2017-05-12 NOTE — ED Notes (Signed)
Wheeled pt back to room. Pt undressing at this time. Pt stated he is unable to void at this time due to using restroom in waiting area.

## 2017-05-12 NOTE — ED Notes (Signed)
Pt told to let nursing staff know when he could give a UA

## 2017-05-12 NOTE — ED Provider Notes (Signed)
[facility] Provider Note   CSN: [id number] Arrival date & time: 05/12/17  1201     History   Chief Complaint Chief Complaint  Patient presents with  . Fever  . Flank Pain    HPI Evan Leonard is a 56 y.o. male.  The history is provided by the patient and medical records.  Flank Pain  This is a new problem. The current episode started more than 2 days ago. The problem occurs constantly. The problem has not changed since onset.Pertinent negatives include no chest pain, no abdominal pain, no headaches and no shortness of breath. Associated symptoms comments: F/C, nausea, decreased urine. Nothing aggravates the symptoms. Nothing relieves the symptoms.    Past Medical History:  Diagnosis Date  . Diabetes mellitus, type II ([place]) 02/2013  . DVT (deep venous thrombosis) ([place]) 05/04/2012   LLE  . Exertional dyspnea 05/04/2012   "isolated episode" (05/05/2012)  . Hepatic steatosis   . History of chickenpox   . Obesity   . OSA on CPAP   . Pulmonary embolism ([place]) 05/04/2012   bilaterally  . Small bowel obstruction ([place]) 09/06/2015  . Varicose veins     Patient Active Problem List   Diagnosis Date Noted  . S/P repair of recurrent ventral hernia 04/26/2017  . Bowel obstruction ([place]) 07/16/2016  . Chronic venous insufficiency 01/25/2015  . Diabetes mellitus with hyperglycemia ([place]) 03/31/2013  . Hyponatremia 03/23/2013  . Pulmonary embolism ([place]) 05/05/2012  . Morbid obesity with body mass index of 40.0-44.9 in adult [facility]) 05/05/2012  . Obstructive sleep apnea 05/05/2012  . DVT of left distal popliteal vein 05/05/2012    Past Surgical History:  Procedure Laterality Date  . HERNIA REPAIR     2015  . LAPAROTOMY N/A 12/13/2013   Procedure: EXPLORATORY LAPAROTOMY Repair ventral hernia, without mesh, partial omentectomy;  Surgeon: Gwenyth Ober, MD;  Location: [facility];  Service: General;  Laterality: N/A;  . VENTRAL HERNIA REPAIR N/A 04/26/2017   Procedure:  INCARCERATED HERNIA REPAIR VENTRAL ADULT;  Surgeon: Georganna Skeans, MD;  Location: [facility];  Service: General;  Laterality: N/A;       Home Medications    Prior to Admission medications   Medication Sig Start Date End Date Taking? Authorizing Provider  ELIQUIS 5 MG TABS tablet TAKE 1 TABLET TWICE A DAY 10/28/16  Yes [name], MD  furosemide (LASIX) 40 MG tablet Take 1 tablet (40 mg total) by mouth daily. 06/23/15  Yes Laurey Morale, MD  ibuprofen (ADVIL,MOTRIN) 200 MG tablet Take 600 mg by mouth every 6 (six) hours as needed for mild pain.   Yes [provider]  metFORMIN (GLUCOPHAGE) 500 MG tablet TAKE 2 TABLETS (1,000 MG TOTAL) BY MOUTH 2 (TWO) TIMES DAILY WITH A MEAL. 01/28/17  Yes [name], MD  Multiple Vitamin (MULTIVITAMIN WITH MINERALS) TABS tablet Take 1 tablet by mouth daily.    Yes [provider]  ondansetron (ZOFRAN ODT) 4 MG disintegrating tablet Take 1 tablet (4 mg total) by mouth every 8 (eight) hours as needed for nausea or vomiting. 07/19/16  Yes Rai, Ripudeep K, MD  OVER THE COUNTER MEDICATION Apply 1 application topically daily. Medication: Bag Balm. Patient states he apply to his legs every day for itching.   Yes [provider]  oxyCODONE (OXY IR/ROXICODONE) 5 MG immediate release tablet Take 1 tablet (5 mg total) by mouth every 4 (four) hours as needed for moderate pain or severe pain (5mg  for moderate  pain, 10mg  for severe pain). 04/29/17  Yes Focht, Jessica L, PA  senna-docusate (SENOKOT S) 8.6-50 MG tablet Take 1 tablet by mouth at bedtime as needed for mild constipation. Also available OTC 07/19/16  Yes Rai, Vernelle Emerald, MD    Family History Family History  Problem Relation Age of Onset  . Diabetes Mother        died age 49- also bleeding ulcer  . Hypertension Mother   . Heart failure Father        in his 48s (patient was 18). day before surgery planned  . Other Maternal Grandmother        states natural causes all  grandparents    Social History Social History   Tobacco Use  . Smoking status: Never Smoker  . Smokeless tobacco: Never Used  Substance Use Topics  . Alcohol use: No    Alcohol/week: 0.0 oz  . Drug use: No     Allergies   Patient has no known allergies.   Review of Systems Review of Systems  Constitutional: Positive for appetite change. Negative for chills and fever.  Respiratory: Negative for shortness of breath.   Cardiovascular: Negative for chest pain.  Gastrointestinal: Positive for nausea. Negative for abdominal pain and vomiting.  Genitourinary: Positive for decreased urine volume and flank pain.  Musculoskeletal: Negative for neck pain.  Skin: Positive for wound (recent surgery).  Allergic/Immunologic: Negative for immunocompromised state.  Neurological: Negative for headaches.  Psychiatric/Behavioral: Negative for confusion.     Physical Exam Updated Vital Signs BP 116/75   Pulse 89   Temp 100 F (37.8 C) (Oral)   Resp 20   SpO2 95%   Physical Exam  Constitutional: He appears well-developed and well-nourished.  HENT:  Head: Normocephalic and atraumatic.  Eyes: Conjunctivae are normal.  Neck: Neck supple.  Cardiovascular: Regular rhythm.  No murmur heard. Tachycardic  Pulmonary/Chest: Effort normal and breath sounds normal. No respiratory distress.  Abdominal: Soft. There is no tenderness.  Well-healing midline abdominal incision w/o signs of surrounding infection  Musculoskeletal: He exhibits no edema.  No CVAT  Neurological: He is alert.  Skin: Skin is warm and dry.  Psychiatric: He has a normal mood and affect.  Nursing note and vitals reviewed.    ED Treatments / Results  Labs (all labs ordered are listed, but only abnormal results are displayed) Labs Reviewed  COMPREHENSIVE METABOLIC PANEL - Abnormal; Notable for the following components:      Result Value   Sodium 134 (*)    Glucose, Bld 146 (*)    Creatinine, Ser 1.40 (*)     Albumin 2.8 (*)    ALT 12 (*)    GFR calc non Af Amer 54 (*)    All other components within normal limits  CBC WITH DIFFERENTIAL/PLATELET - Abnormal; Notable for the following components:   WBC 17.8 (*)    Neutro Abs 15.3 (*)    Monocytes Absolute 1.1 (*)    All other components within normal limits  URINE CULTURE  URINALYSIS, ROUTINE W REFLEX MICROSCOPIC  I-STAT CG4 LACTIC ACID, ED  I-STAT CG4 LACTIC ACID, ED    EKG  EKG Interpretation None       Radiology Dg Chest 2 View  Result Date: 05/12/2017 CLINICAL DATA:  Fever and tachypnea EXAM: CHEST  2 VIEW COMPARISON:  06/15/2015 FINDINGS: Minor atelectasis at the left base. Lung volumes are low. Normal heart size and mediastinal contours. There is no edema, consolidation, effusion, or pneumothorax. IMPRESSION: Minimal  atelectasis at the left base.  No visible pneumonia. Electronically Signed   By: Monte Fantasia M.D.   On: 05/12/2017 11:04   Ct Renal Stone Study  Result Date: 05/12/2017 CLINICAL DATA:  Fevers, chills, and nausea. Right flank pain. Recent hernia repair. EXAM: CT ABDOMEN AND PELVIS WITHOUT CONTRAST TECHNIQUE: Multidetector CT imaging of the abdomen and pelvis was performed following the standard protocol without IV contrast. COMPARISON:  CT abdomen pelvis dated April 26, 2017. FINDINGS: Lower chest: No acute abnormality. Hepatobiliary: Hepatic steatosis. The gallbladder is unremarkable. No biliary dilatation. Pancreas: Unremarkable. No pancreatic ductal dilatation or surrounding inflammatory changes. Spleen: Normal in size without focal abnormality. Adrenals/Urinary Tract: The adrenal glands are unremarkable. The previously seen 14 mm calculus in the midpole of the right kidney is now located at the UPJ, with resultant moderate right hydronephrosis and prominent surrounding perinephric fat stranding. The previously seen 11 mm calculus in the proximal right ureter is now located in the distal right ureter. Additional  nonobstructive right renal calculi are unchanged. Punctate nonobstructive calculi in the left kidney are unchanged. No left hydronephrosis. The bladder is unremarkable. Stomach/Bowel: Stomach is within normal limits. Appendix appears normal. No evidence of bowel wall thickening, distention, or inflammatory changes. Vascular/Lymphatic: No significant vascular findings. Mildly enlarged right external iliac and inguinal lymph nodes appear slightly increased in size when compared to prior study, measuring up to 1.6 cm in short axis. Reproductive: The prostate is borderline enlarged, similar to prior study. Other: Recurrent supraumbilical hernia containing nondilated loops of small bowel and fat. There is a 2.8 x 3.9 x 4.9 cm fluid collection in the anterior abdominal wall at the site of recent incision. No pneumoperitoneum or free fluid. Musculoskeletal: No acute or significant osseous findings. IMPRESSION: 1. Previously seen 14 mm calculus in the midpole of the right kidney is now located at the right UPJ, resulting in moderate right hydronephrosis. Prominent right perinephric fat stranding could reflect pyelonephritis given clinical history, although evaluation is limited without intravenous contrast. 2. The previously seen 11 mm calculus in the proximal right ureter is now located in the distal right ureter. There is moderate right ureteral dilatation. 3. Recurrent supraumbilical hernia containing nondilated loops of small bowel. No evidence of bowel obstruction. 4. 2.8 x 3.9 x 4.9 cm fluid collection in the anterior abdominal wall at the site of recent incision. This may reflect a small seroma, however, an abscess could have a similar appearance. 5. Mildly enlarged right external iliac and inguinal lymph nodes appear slightly increased in size when compared to prior study, measuring up to 1.6 cm in short axis. These are nonspecific, and further evaluation with follow-up CT in 3 months is recommended. 6. Hepatic  steatosis, unchanged. Electronically Signed   By: Titus Dubin M.D.   On: 05/12/2017 18:24    Procedures Procedures (including critical care time)  Medications Ordered in ED Medications  ondansetron (ZOFRAN-ODT) disintegrating tablet 4 mg ( Oral MAR Hold 05/12/17 2104)  scopolamine (TRANSDERM-SCOP) 1 MG/3DAYS (not administered)  sodium chloride 0.9 % bolus 1,000 mL (1,000 mLs Intravenous New Bag/Given 05/12/17 1757)  acetaminophen (TYLENOL) tablet 325 mg (325 mg Oral Given 05/12/17 1756)  cefTRIAXone (ROCEPHIN) 2 g in dextrose 5 % 50 mL IVPB (0 g Intravenous Stopped 05/12/17 1914)     Initial Impression / Assessment and Plan / ED Course  I have reviewed the triage vital signs and the nursing notes.  Pertinent labs & imaging results that were available during my care of the patient  were reviewed by me and considered in my medical decision making (see chart for details).     Pt presents from UC w/concern for early sepsis 2/2 pyelonephritis. Recently had a ventral hernia repair w/suture removal on Friday; says he felt fine at the appointment, but started developing nausea later that day. Also endorses subjective F/C, nausea, anorexia, left flank pain, and decreased urinary output.  VS & exam as above. UA performed earlier today positive for nitrites & leukocytes; labs remarkable for WBC 17.8, Crt 1.40, LA 1.85. NS bolus, tylenol, Rocephin given in the ED.   Concern for pyelonephritis vs infected stone so CT ordered & showed movement of two previously noted stones w/accompanying moderate hydroureteronephrosis.  Urology consulted by phone and will take the Pt to the OR for cytoscopy & stent placement.  Final Clinical Impressions(s) / ED Diagnoses   Final diagnoses:  Pyelonephritis  Kidney stone    ED Discharge Orders    None       Jenny Reichmann, MD 05/12/17 2132    Pattricia Boss, MD 05/13/17 [phone]

## 2017-05-12 NOTE — ED Provider Notes (Signed)
56 year old man with history of kidney stones presents today with right flank pain, fever, and urinary tract infection.  He is noted to have obstructing kidney stone on the right.  Urology is consulted and will take the patient to the operating room.  He is hemodynamically stable here and is given IV Rocephin 2 g. I saw and evaluated the patient, reviewed the resident's note and I agree with the findings and plan.   EKG Interpretation None         Pattricia Boss, MD 05/13/17 [phone]

## 2017-05-12 NOTE — ED Triage Notes (Signed)
Pt states he had a hernia repair last Saturday then had stitches taken out this past Friday. Pt complains of fevers, chills, nausea. Pt states he wakes up in sweats. Pt also complains of right flank pain.

## 2017-05-12 NOTE — Op Note (Signed)
PATIENT:  Evan Leonard  Preoperative diagnosis:  1. Right obstructing ureteral stone   2. Fossa navicularis stricture  Postoperative diagnosis:  1. Same  Procedure:  1. Cystoscopy 2. Fossa navicularis stricture dilation 3. RIght ureteral stent placement (60fr x 26cm JJ)  4. Right retrograde pyelography with interpretation   Surgeon: [name], M.D., Dayle Points, MD  Anesthesia: General  Complications: None  EBL: Minimal  Specimens: None  Indication: Evan Leonard is a 55 y.o. male with fever, tachycardia and two right obstructing stones. After reviewing the management options for treatment, they have elected to proceed with the above surgical procedure(s). We have discussed the potential benefits and risks of the procedure, side effects of the proposed treatment, the likelihood of the patient achieving the goals of the procedure, and any potential problems that might occur during the procedure or recuperation. Informed consent has been obtained.  Findings:  - Right RPG revealed two opacifications in the ureter, one distal and one in the proximal portion. Mid ureter distended. Minimal contrast opacified the renal pelvis.  - Delicate fossa navicularis stricture dilated with scope - Small stone fragments and hydronephrotic efflux noted on sensor wire passage  - Uncomplicated placement of 83fr x 26 cm JJ ureteral stent   Description of procedure:   The patient was taken to the operating room and general anesthesia was induced.  The patient was placed in the dorsal lithotomy position, prepped and draped in the usual sterile fashion, and preoperative antibiotics were administered. A preoperative time-out was performed.   Cystourethroscopy was performed. There was a delicate narrowing at the fossa navicularis which was dilated with gentle pressure from the scop.e  The patient's urethra was examined and was normal. The bladder was then systematically examined in its entirety. There was no evidence  for any bladder tumors, stones, or other mucosal pathology.    Attention then turned to the right ureteral orifice and a ureteral catheter was used to intubate the ureteral orifice.  Omnipaque contrast was injected through the ureteral catheter and a retrograde pyelogram which revealed the above findings.  A Sensor guidewire was then advanced up the right ureter into the renal pelvis under fluoroscopic guidance.  The wire was then backloaded through the cystoscope and a ureteral stent was advance over the wire using Seldinger technique.  The stent was positioned appropriately under fluoroscopic and cystoscopic guidance.  The wire was then removed with an adequate stent curl noted in the renal pelvis as well as in the bladder.  The bladder was then emptied and the procedure ended. A 30fr foley catheter was placed with 10cc in the balloon The patient appeared to tolerate the procedure well and without complications.  The patient was able to be awakened and transferred to the recovery unit in satisfactory condition.    Plan post op: - Spoke to [facility] hospitalist who will admit post op, we appreciate their assistance  - OK for foley DC tomorrow morning  - Urology will coordinate definitive stone treatment in near future  - Tailor abx to culture data

## 2017-05-12 NOTE — ED Triage Notes (Addendum)
Pt states "I had surgery two weeks ago hernia repair they took the stitches out Friday. I just dont feel good, they gave me something for nausea." Pt c/o fever, chills x3 days. Also c/o R flank pain.

## 2017-05-12 NOTE — Consult Note (Signed)
Urology Consult Note   Requesting Attending Physician:  Pattricia Boss, MD Service Providing Consult: Urology  Consulting Attending: [name]   Reason for Consult:  Obstructing stone, fever  HPI: Evan Leonard is seen in consultation for reasons noted above at the request of Pattricia Boss, MD for evaluation of obstructing stone with fever.  This is a 56 y.o. male who presents with worsening right flank pain and subjective fever for 2-3 days.   On 12/1 found to have an incarcerated ventral hernia. CT scan at that time noted a 96mm right proximal ureteral stone with hydronephrosis. Urine nitrite positive and few bacteria seen. Underwent ventral hernia repair.   Over the past few days, has had worsening right flank pain, nausea and vomiting. Subjective fever and chills at home.   Presented to [facility] ED, tachy to 110s and low grade fever to 100.2. WBC 17, U/A notable for + nitrites, small LE.  CT today reveals two stones in the right ureter, a 57mm stone at the right UPJ and a 65mm stone in the distl ureter. Of note. A small 2.8 x 3.9 x 4.9 cm fluid collection is seen in the area of the anterior abdm wall, likely a seroma from prior surgery,.   On exam, the patient is diaphoretic but his pain is controlled.    Past Medical History: Past Medical History:  Diagnosis Date  . Diabetes mellitus, type II ([place]) 02/2013  . DVT (deep venous thrombosis) ([place]) 05/04/2012   LLE  . Exertional dyspnea 05/04/2012   "isolated episode" (05/05/2012)  . Hepatic steatosis   . History of chickenpox   . Obesity   . OSA on CPAP   . Pulmonary embolism ([place]) 05/04/2012   bilaterally  . Small bowel obstruction ([place]) 09/06/2015  . Varicose veins     Past Surgical History:  Past Surgical History:  Procedure Laterality Date  . HERNIA REPAIR     2015  . LAPAROTOMY N/A 12/13/2013   Procedure: EXPLORATORY LAPAROTOMY Repair ventral hernia, without mesh, partial omentectomy;  Surgeon: Gwenyth Ober, MD;  Location: [place];   Service: General;  Laterality: N/A;  . VENTRAL HERNIA REPAIR N/A 04/26/2017   Procedure: INCARCERATED HERNIA REPAIR VENTRAL ADULT;  Surgeon: Georganna Skeans, MD;  Location: [place];  Service: General;  Laterality: N/A;    Medication: Current Facility-Administered Medications  Medication Dose Route Frequency Provider Last Rate Last Dose  . ondansetron (ZOFRAN-ODT) disintegrating tablet 4 mg  4 mg Oral Once PRN Pattricia Boss, MD       Current Outpatient Medications  Medication Sig Dispense Refill  . ELIQUIS 5 MG TABS tablet TAKE 1 TABLET TWICE A DAY 60 tablet 5  . furosemide (LASIX) 40 MG tablet Take 1 tablet (40 mg total) by mouth daily. 30 tablet 5  . ibuprofen (ADVIL,MOTRIN) 200 MG tablet Take 600 mg by mouth every 6 (six) hours as needed for mild pain.    . metFORMIN (GLUCOPHAGE) 500 MG tablet TAKE 2 TABLETS (1,000 MG TOTAL) BY MOUTH 2 (TWO) TIMES DAILY WITH A MEAL. 120 tablet 5  . Multiple Vitamin (MULTIVITAMIN WITH MINERALS) TABS tablet Take 1 tablet by mouth daily.     . ondansetron (ZOFRAN ODT) 4 MG disintegrating tablet Take 1 tablet (4 mg total) by mouth every 8 (eight) hours as needed for nausea or vomiting. 20 tablet 0  . OVER THE COUNTER MEDICATION Apply 1 application topically daily. Medication: Bag Balm. Patient states he apply to his legs every day for itching.    Marland Kitchen  oxyCODONE (OXY IR/ROXICODONE) 5 MG immediate release tablet Take 1 tablet (5 mg total) by mouth every 4 (four) hours as needed for moderate pain or severe pain (5mg  for moderate pain, 10mg  for severe pain). 20 tablet 0  . senna-docusate (SENOKOT S) 8.6-50 MG tablet Take 1 tablet by mouth at bedtime as needed for mild constipation. Also available OTC 30 tablet 0    Allergies: No Known Allergies  Social History: Social History   Tobacco Use  . Smoking status: Never Smoker  . Smokeless tobacco: Never Used  Substance Use Topics  . Alcohol use: No    Alcohol/week: 0.0 oz  . Drug use: No    Family  History Family History  Problem Relation Age of Onset  . Diabetes Mother        died age 28- also bleeding ulcer  . Hypertension Mother   . Heart failure Father        in his 13s (patient was 19). day before surgery planned  . Other Maternal Grandmother        states natural causes all grandparents    Review of Systems 10 systems were reviewed and are negative except as noted specifically in the HPI.  Objective   Vital signs in last 24 hours: BP 116/75   Pulse 89   Temp 100 F (37.8 C) (Oral)   Resp 20   SpO2 95%   Physical Exam General: NAD, A&O, resting, appropriate HEENT: [place]/AT, EOMI, MMM Pulmonary: Normal work of breathing Cardiovascular: HDS, adequate peripheral perfusion Abdomen: Soft, NTTP, nondistended. GU: right CVA tenderness Extremities: warm and well perfused Neuro: Appropriate, no focal neurological deficits  Most Recent Labs: Lab Results  Component Value Date   WBC 17.8 (H) 05/12/2017   HGB 13.0 05/12/2017   HCT 39.5 05/12/2017   PLT 378 05/12/2017    Lab Results  Component Value Date   NA 134 (L) 05/12/2017   K 4.1 05/12/2017   CL 102 05/12/2017   CO2 22 05/12/2017   BUN 13 05/12/2017   CREATININE 1.40 (H) 05/12/2017   CALCIUM 9.0 05/12/2017   MG 2.1 06/20/2015   PHOS 2.9 06/16/2015    Lab Results  Component Value Date   INR 1.15 06/16/2015   APTT 91 (H) 07/17/2016     IMAGING: Dg Chest 2 View  Result Date: 05/12/2017 CLINICAL DATA:  Fever and tachypnea EXAM: CHEST  2 VIEW COMPARISON:  06/15/2015 FINDINGS: Minor atelectasis at the left base. Lung volumes are low. Normal heart size and mediastinal contours. There is no edema, consolidation, effusion, or pneumothorax. IMPRESSION: Minimal atelectasis at the left base.  No visible pneumonia. Electronically Signed   By: Monte Fantasia M.D.   On: 05/12/2017 11:04   Ct Renal Stone Study  Result Date: 05/12/2017 CLINICAL DATA:  Fevers, chills, and nausea. Right flank pain. Recent hernia  repair. EXAM: CT ABDOMEN AND PELVIS WITHOUT CONTRAST TECHNIQUE: Multidetector CT imaging of the abdomen and pelvis was performed following the standard protocol without IV contrast. COMPARISON:  CT abdomen pelvis dated April 26, 2017. FINDINGS: Lower chest: No acute abnormality. Hepatobiliary: Hepatic steatosis. The gallbladder is unremarkable. No biliary dilatation. Pancreas: Unremarkable. No pancreatic ductal dilatation or surrounding inflammatory changes. Spleen: Normal in size without focal abnormality. Adrenals/Urinary Tract: The adrenal glands are unremarkable. The previously seen 14 mm calculus in the midpole of the right kidney is now located at the UPJ, with resultant moderate right hydronephrosis and prominent surrounding perinephric fat stranding. The previously seen 11 mm calculus  in the proximal right ureter is now located in the distal right ureter. Additional nonobstructive right renal calculi are unchanged. Punctate nonobstructive calculi in the left kidney are unchanged. No left hydronephrosis. The bladder is unremarkable. Stomach/Bowel: Stomach is within normal limits. Appendix appears normal. No evidence of bowel wall thickening, distention, or inflammatory changes. Vascular/Lymphatic: No significant vascular findings. Mildly enlarged right external iliac and inguinal lymph nodes appear slightly increased in size when compared to prior study, measuring up to 1.6 cm in short axis. Reproductive: The prostate is borderline enlarged, similar to prior study. Other: Recurrent supraumbilical hernia containing nondilated loops of small bowel and fat. There is a 2.8 x 3.9 x 4.9 cm fluid collection in the anterior abdominal wall at the site of recent incision. No pneumoperitoneum or free fluid. Musculoskeletal: No acute or significant osseous findings. IMPRESSION: 1. Previously seen 14 mm calculus in the midpole of the right kidney is now located at the right UPJ, resulting in moderate right  hydronephrosis. Prominent right perinephric fat stranding could reflect pyelonephritis given clinical history, although evaluation is limited without intravenous contrast. 2. The previously seen 11 mm calculus in the proximal right ureter is now located in the distal right ureter. There is moderate right ureteral dilatation. 3. Recurrent supraumbilical hernia containing nondilated loops of small bowel. No evidence of bowel obstruction. 4. 2.8 x 3.9 x 4.9 cm fluid collection in the anterior abdominal wall at the site of recent incision. This may reflect a small seroma, however, an abscess could have a similar appearance. 5. Mildly enlarged right external iliac and inguinal lymph nodes appear slightly increased in size when compared to prior study, measuring up to 1.6 cm in short axis. These are nonspecific, and further evaluation with follow-up CT in 3 months is recommended. 6. Hepatic steatosis, unchanged. Electronically Signed   By: Titus Dubin M.D.   On: 05/12/2017 18:24    ------  Assessment:  56 y.o. male with two right obstructing stones, fever, WBC 18 and urinalysis consistent with infection.  He is on Eliquis for DVT and that precludes nephrostomy drainage.     Plan for emergent right ureteral stent placement. Discussed risk, benefits and alternatives with patient in detail.    Recommendations: - NPO  - IV Rocephin - Right ureteral stent placement, emergent.  - Admit to urology, [name] post op    Thank you for this consult. Please contact the urology consult pager with any further questions/concerns.

## 2017-05-12 NOTE — Progress Notes (Signed)
Pharmacy Antibiotic Note  Evan Leonard is a 56 y.o. male admitted on 05/12/2017 with UTI.  Pharmacy has been consulted for ceftriaxone dosing.  Plan: Ceftriaxone 1g IV q 24 hrs. Pharmacy will sign-off, please contact if questions.     Temp (24hrs), Avg:99.6 F (37.6 C), Min:98.4 F (36.9 C), Max:100.2 F (37.9 C)  Recent Labs  Lab 05/12/17 1228 05/12/17 1236 05/12/17 1955  WBC 17.8*  --   --   CREATININE 1.40*  --   --   LATICACIDVEN  --  1.85 0.88    CrCl cannot be calculated (Unknown ideal weight.).    No Known Allergies  Antimicrobials this admission: Ceftriaxone 12/17 >> *  Dose adjustments this admission:   Microbiology results: 12/17 BCx:  12/17 UCx:     Thank you for allowing pharmacy to be a part of this patient's care.  Uvaldo Rising, BCPS  Clinical Pharmacist Pager [phone]  05/12/2017 10:37 PM

## 2017-05-12 NOTE — Transfer of Care (Signed)
Immediate Anesthesia Transfer of Care Note  [name]  Procedure(s) Performed: CYSTOSCOPY WITH RETROGRADE PYELOGRAM/URETERAL STENT PLACEMENT (Right Bladder)  Patient Location: PACU  Anesthesia Type:General  Level of Consciousness: awake, alert  and oriented  Airway & Oxygen Therapy: Patient Spontanous Breathing  Post-op Assessment: Report given to RN and Post -op Vital signs reviewed and stable  Post vital signs: Reviewed and stable  Last Vitals:  Vitals:   05/12/17 1938 05/12/17 2213  BP: 116/75   Pulse: 89 (P) 90  Resp: 20 (!) (P) 23  Temp:  (P) 36.9 C  SpO2: 95% (P) 100%    Last Pain:  Vitals:   05/12/17 1914  TempSrc:   PainSc: 1          Complications: No apparent anesthesia complications

## 2017-05-12 NOTE — ED Provider Notes (Signed)
Evan Leonard   [id number] 05/12/17 Arrival Time: 0347  ASSESSMENT & PLAN:  1. Fever and chills   2. Tachypnea   3. Tachycardia   4. Nausea without vomiting   5. Acute pyelonephritis     Meds ordered this encounter  Medications  . ondansetron (ZOFRAN-ODT) disintegrating tablet 4 mg   Concern for early sepsis with temperature close to 100.4, tachypnea, and tachycardia. Unable to obtain CBC promptly. I am sending him to the Emergency Department for further evaluation. Discussed.  Reviewed expectations re: course of current medical issues. Questions answered. Outlined signs and symptoms indicating need for more acute intervention. Patient verbalized understanding. After Visit Summary given.   SUBJECTIVE:  Evan Leonard is a 56 y.o. male who presents with complaint of "not feeling well". Onset gradual, approximately 3 days ago. Surgery two weeks ago for incarcerated hernia repair; sutures removed this past Friday by surgeon. He reports feeling well at that time. Later that evening fatigue and mild nausea started. No emesis. Overall fatigued. Decreased PO intake. Mild non-radiating R flank discomfort without urinary frequency, dysuria, or hematuria. Normal bowel habits. No CP/SOB but "getting winded easily" with ambulation. Mild dry cough. No abdominal pain reported. No change in abdominal surgical wound. No OTC treatment. No specific aggravating or alleviating factors reported for current symptoms.  ROS: As per HPI. All other systems negative.   OBJECTIVE:  Vitals:   05/12/17 1028  BP: (!) 161/95  Pulse: (!) 111  Resp: (!) 26  Temp: 100.2 F (37.9 C)  SpO2: 100%    General appearance: alert; appears fatigued; sweaty Eyes: PERRLA; EOMI; conjunctiva normal HENT: normocephalic Neck: supple Lungs: clear to auscultation bilaterally; tachypneic Heart: tachycardic with regular rhythm Abdomen: soft, non-tender; bowel sounds normal; no masses or organomegaly; no  guarding or rebound tenderness; midline scar of lower abdomen appears to be healing well; no sign of infection Back: no CVA tenderness Extremities: no cyanosis or edema; symmetrical with no gross deformities Skin: warm and dry Neurologic: normal gait; normal symmetric reflexes Psychological: alert and cooperative; normal mood and affect  Labs: Results for orders placed or performed during the hospital encounter of 05/12/17  POCT urinalysis dip (device)  Result Value Ref Range   Glucose, UA NEGATIVE NEGATIVE mg/dL   Bilirubin Urine NEGATIVE NEGATIVE   Ketones, ur NEGATIVE NEGATIVE mg/dL   Specific Gravity, Urine 1.020 1.005 - 1.030   Hgb urine dipstick MODERATE (A) NEGATIVE   pH 5.5 5.0 - 8.0   Protein, ur NEGATIVE NEGATIVE mg/dL   Urobilinogen, UA 1.0 0.0 - 1.0 mg/dL   Nitrite POSITIVE (A) NEGATIVE   Leukocytes, UA SMALL (A) NEGATIVE   Labs Reviewed  POCT URINALYSIS DIP (DEVICE) - Abnormal; Notable for the following components:      Result Value   Hgb urine dipstick MODERATE (*)    Nitrite POSITIVE (*)    Leukocytes, UA SMALL (*)    All other components within normal limits    Imaging: Dg Chest 2 View  Result Date: 05/12/2017 CLINICAL DATA:  Fever and tachypnea EXAM: CHEST  2 VIEW COMPARISON:  06/15/2015 FINDINGS: Minor atelectasis at the left base. Lung volumes are low. Normal heart size and mediastinal contours. There is no edema, consolidation, effusion, or pneumothorax. IMPRESSION: Minimal atelectasis at the left base.  No visible pneumonia. Electronically Signed   By: Monte Fantasia M.D.   On: 05/12/2017 11:04   No Known Allergies  Past Medical History:  Diagnosis Date  . Diabetes mellitus, type II ([place]) 02/2013  .  DVT (deep venous thrombosis) ([place]) 05/04/2012   LLE  . Exertional dyspnea 05/04/2012   "isolated episode" (05/05/2012)  . Hepatic steatosis   . History of chickenpox   . Obesity   . OSA on CPAP   . Pulmonary embolism ([place]) 05/04/2012   bilaterally  .  Small bowel obstruction ([place]) 09/06/2015  . Varicose veins    Social History   Socioeconomic History  . Marital status: Single    Spouse name: Not on file  . Number of children: Not on file  . Years of education: Not on file  . Highest education level: Not on file  Social Needs  . Financial resource strain: Not on file  . Food insecurity - worry: Not on file  . Food insecurity - inability: Not on file  . Transportation needs - medical: Not on file  . Transportation needs - non-medical: Not on file  Occupational History  . 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 Smoking status: Never Smoker  . Smokeless tobacco: Never Used  Substance and Sexual Activity  . Alcohol use: No    Alcohol/week: 0.0 oz  . Drug use: No  . Sexual activity: Not Currently  Other Topics Concern  . Not on file  Social History Narrative   Single. Lives alone. Friends that live close. No pets.       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       Hobbies: golf, basketball, walking   Tired from work right now   Family History  Problem Relation Age of Onset  . Diabetes Mother        died age 34- also bleeding ulcer  . Hypertension Mother   . Heart failure Father        in his 33s (patient was 20). day before surgery planned  . Other Maternal Grandmother        states natural causes all grandparents   Past Surgical History:  Procedure Laterality Date  . HERNIA REPAIR     2015  . LAPAROTOMY N/A 12/13/2013   Procedure: EXPLORATORY LAPAROTOMY Repair ventral hernia, without mesh, partial omentectomy;  Surgeon: Gwenyth Ober, MD;  Location: [place];  Service: General;  Laterality: N/A;  . VENTRAL HERNIA REPAIR N/A 04/26/2017   Procedure: INCARCERATED HERNIA REPAIR VENTRAL ADULT;  Surgeon: Georganna Skeans, MD;  Location: [place];  Service: General;  Laterality: Ginette Pitman, MD 05/12/17 1123

## 2017-05-12 NOTE — ED Notes (Signed)
Patient transported to CT 

## 2017-05-12 NOTE — Anesthesia Procedure Notes (Signed)
Procedure Name: LMA Insertion Date/Time: 05/12/2017 9:36 PM Performed by: Babs Bertin, CRNA Pre-anesthesia Checklist: Patient identified, Emergency Drugs available, Suction available and Patient being monitored Patient Re-evaluated:Patient Re-evaluated prior to induction Oxygen Delivery Method: Circle System Utilized Preoxygenation: Pre-oxygenation with 100% oxygen Induction Type: IV induction Ventilation: Mask ventilation without difficulty LMA: LMA with gastric port inserted LMA Size: 5.0 Number of attempts: 1 Placement Confirmation: positive ETCO2 Tube secured with: Tape Dental Injury: Teeth and Oropharynx as per pre-operative assessment

## 2017-05-12 NOTE — Anesthesia Preprocedure Evaluation (Addendum)
Anesthesia Evaluation  Patient identified by MRN, date of birth, ID band Patient awake    Reviewed: Allergy & Precautions, NPO status , Patient's Chart, lab work & pertinent test results  History of Anesthesia Complications Negative for: history of anesthetic complications  Airway Mallampati: III  TM Distance: >3 FB Neck ROM: Full    Dental  (+) Teeth Intact, Dental Advisory Given, Chipped,    Pulmonary sleep apnea ,    Pulmonary exam normal        Cardiovascular + Peripheral Vascular Disease  Normal cardiovascular exam     Neuro/Psych negative neurological ROS     GI/Hepatic negative GI ROS, Neg liver ROS,   Endo/Other  diabetes, Type 2, Oral Hypoglycemic AgentsMorbid obesity  Renal/GU negative Renal ROS     Musculoskeletal negative musculoskeletal ROS (+)   Abdominal   Peds  Hematology negative hematology ROS (+)   Anesthesia Other Findings Day of surgery medications reviewed with the patient.  Reproductive/Obstetrics                             Lab Results  Component Value Date   WBC 17.8 (H) 05/12/2017   HGB 13.0 05/12/2017   HCT 39.5 05/12/2017   MCV 86.1 05/12/2017   PLT 378 05/12/2017   Lab Results  Component Value Date   CREATININE 1.40 (H) 05/12/2017   BUN 13 05/12/2017   NA 134 (L) 05/12/2017   K 4.1 05/12/2017   CL 102 05/12/2017   CO2 22 05/12/2017   Lab Results  Component Value Date   INR 1.15 06/16/2015   INR 1.17 12/14/2013   INR 1.20 05/07/2012   EKG: sinus tachycardia.  Anesthesia Physical  Anesthesia Plan  ASA: III and emergent  Anesthesia Plan: General   Post-op Pain Management:    Induction: Intravenous, Rapid sequence and Cricoid pressure planned  PONV Risk Score and Plan: 4 or greater and Ondansetron, Dexamethasone, Treatment may vary due to age or medical condition and Scopolamine patch - Pre-op  Airway Management Planned: Oral  ETT  Additional Equipment:   Intra-op Plan:   Post-operative Plan: Extubation in OR  Informed Consent: I have reviewed the patients History and Physical, chart, labs and discussed the procedure including the risks, benefits and alternatives for the proposed anesthesia with the patient or authorized representative who has indicated his/her understanding and acceptance.   Dental advisory given  Plan Discussed with: CRNA and Anesthesiologist  Anesthesia Plan Comments:         Anesthesia Quick Evaluation

## 2017-05-12 NOTE — Anesthesia Postprocedure Evaluation (Signed)
Anesthesia Post Note  [name]  Procedure(s) Performed: CYSTOSCOPY WITH RETROGRADE PYELOGRAM/URETERAL STENT PLACEMENT (Right Bladder)     Patient location during evaluation: PACU Anesthesia Type: General Level of consciousness: sedated Pain management: pain level controlled Vital Signs Assessment: post-procedure vital signs reviewed and stable Respiratory status: spontaneous breathing and respiratory function stable Cardiovascular status: stable Postop Assessment: no apparent nausea or vomiting Anesthetic complications: no    Last Vitals:  Vitals:   05/12/17 1938 05/12/17 2213  BP: 116/75 129/80  Pulse: 89 90  Resp: 20 (!) 23  Temp:  36.9 C  SpO2: 95% 100%    Last Pain:  Vitals:   05/12/17 1914  TempSrc:   PainSc: [place]

## 2017-05-13 ENCOUNTER — Encounter (HOSPITAL_COMMUNITY): Payer: Self-pay | Admitting: Urology

## 2017-05-13 DIAGNOSIS — N200 Calculus of kidney: Secondary | ICD-10-CM

## 2017-05-13 DIAGNOSIS — N10 Acute tubulo-interstitial nephritis: Secondary | ICD-10-CM | POA: Diagnosis present

## 2017-05-13 DIAGNOSIS — K439 Ventral hernia without obstruction or gangrene: Secondary | ICD-10-CM

## 2017-05-13 DIAGNOSIS — N1330 Unspecified hydronephrosis: Secondary | ICD-10-CM | POA: Diagnosis present

## 2017-05-13 DIAGNOSIS — Z86711 Personal history of pulmonary embolism: Secondary | ICD-10-CM

## 2017-05-13 DIAGNOSIS — N12 Tubulo-interstitial nephritis, not specified as acute or chronic: Secondary | ICD-10-CM

## 2017-05-13 LAB — BASIC METABOLIC PANEL
Anion gap: 10 (ref 5–15)
BUN: 14 mg/dL (ref 6–20)
CO2: 23 mmol/L (ref 22–32)
Calcium: 8.6 mg/dL — ABNORMAL LOW (ref 8.9–10.3)
Chloride: 104 mmol/L (ref 101–111)
Creatinine, Ser: 1.35 mg/dL — ABNORMAL HIGH (ref 0.61–1.24)
GFR calc Af Amer: >60 mL/min (ref >60)
GFR calc non Af Amer: 56 mL/min — ABNORMAL LOW (ref >60)
Glucose, Bld: 150 mg/dL — ABNORMAL HIGH (ref 65–99)
Potassium: 3.6 mmol/L (ref 3.5–5.1)
Sodium: 137 mmol/L (ref 135–145)

## 2017-05-13 LAB — CBC
HCT: 34.8 % — ABNORMAL LOW (ref 39.0–52.0)
Hemoglobin: 11.2 g/dL — ABNORMAL LOW (ref 13.0–17.0)
MCH: 27.8 pg (ref 26.0–34.0)
MCHC: 32.2 g/dL (ref 30.0–36.0)
MCV: 86.4 fL (ref 78.0–100.0)
Platelets: 304 10*3/uL (ref 150–400)
RBC: 4.03 MIL/uL — ABNORMAL LOW (ref 4.22–5.81)
RDW: 13.9 % (ref 11.5–15.5)
WBC: 14.1 10*3/uL — ABNORMAL HIGH (ref 4.0–10.5)

## 2017-05-13 LAB — URINALYSIS, ROUTINE W REFLEX MICROSCOPIC
Bilirubin Urine: NEGATIVE
Glucose, UA: NEGATIVE mg/dL
Ketones, ur: NEGATIVE mg/dL
Nitrite: NEGATIVE
Protein, ur: NEGATIVE mg/dL
Specific Gravity, Urine: 1.009 (ref 1.005–1.030)
pH: 6 (ref 5.0–8.0)

## 2017-05-13 LAB — GLUCOSE, CAPILLARY
Glucose-Capillary: 143 mg/dL — ABNORMAL HIGH (ref 65–99)
Glucose-Capillary: 130 mg/dL — ABNORMAL HIGH (ref 65–99)
Glucose-Capillary: 88 mg/dL (ref 65–99)
Glucose-Capillary: 123 mg/dL — ABNORMAL HIGH (ref 65–99)
Glucose-Capillary: 109 mg/dL — ABNORMAL HIGH (ref 65–99)

## 2017-05-13 MED ORDER — INSULIN ASPART 100 UNIT/ML ~~LOC~~ SOLN
0.0000 [IU] | Freq: Three times a day (TID) | SUBCUTANEOUS | Status: DC
  Administered 2017-05-13 (×2): 1 [IU] via SUBCUTANEOUS

## 2017-05-13 MED ORDER — SCOPOLAMINE 1 MG/3DAYS TD PT72
1.0000 | MEDICATED_PATCH | TRANSDERMAL | Status: DC
  Filled 2017-05-13: qty 1

## 2017-05-13 NOTE — Progress Notes (Signed)
PROGRESS NOTE    Evan Leonard  XIP:[id number] DOB: [birth year] DOA: 05/12/2017 PCP: [name], MD   Specialists:  Dr. [name] oncology Dr. Braulio Conte sleep   Brief Narrative:  56 year old male Diabetes mellitus type 2 Pulmonary emboli initially diagnosed 05/07/2012 on chronic anticoagulation with Eliquis also coincidental--left distal popliteal DVT--was previously followed by Dr. [name] of oncology-was supposed to be on aspirin 182  after 1 year from initial diagnosis Nephrolithiasis  history of small bowel obstruction 07/19/16 Morbid obesity BMI >40 OSA on CPAP? Prior recurrent lower extremity cellulitis Prior exploratory laparotomy strangulated umbilical hernia 6/73/4193 without mesh Prior SBO 08/2015 without operation  12/1 had incarcerated ventral hernia CT scan showed 8 mm right proximal ureteric stone with hydronephrosis and underwent ventral repair  Admitted with worsening right flank pain subjective fever 2-3 days prior to admission 12/17-tachycardia 110, low-grade fever 100.2, WBC 17, UA + nitrates CT =2 stones in right ureter 14 mm stone right UPJ, 11 mm stone distal ureter +2.8 X3.9X 4.9?  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 Roni Bread 12/17 Follow blood culture X2 from 12/17-continue ceftriaxone every 24 Defer further management to urologist outpatient Continue oxycodone 5-10 mg every 4 as needed pain Keep saline 100 cc/h for now  Prior PE/DVT in 2013 Continue for now Eliquis 5 mg twice daily previously was on Xarelto Outpatient discussion  with his oncologist Dr. [name] Resume anticoagulation as per urologist  Recent ventral hernia surgery 12/1 Needs outpatient follow-up with general surgery as has seroma Can wear binder for 10 days and go forward from there with OP follow-up  OSA on CPAP Morbid obesity Previously was thought to be a good candidate for gastric bypass surgery Continue CPAP/BiPAP nightly as needed Lasix 40 prior to admission has been held  Diabetes mellitus type 2 Sugars ranging between 97 and 130, holding Glucophage 1 g twice daily for now Continue sliding scale coverage sensitive  Constipation continue senna 1 tab for mild constipation     DVT prophylaxis: on Apixaban Code Status: Full Family Communication:  None bedside Disposition Plan: inpatient   Consultants:   none  Procedures:   None   Antimicrobials:   Ceftriaxone    Subjective:  Well alert oriented in nad No pain tol diet No cp No fever no chills   Objective: Vitals:   05/13/17 0030 05/13/17 0044 05/13/17 0117 05/13/17 0531  BP: 123/66 123/66 129/75 121/72  Pulse: 84 91 89 78  Resp: (!) 24 19 20 20   Temp:  98.3 F (36.8 C) 100.1 F (37.8 C) 98.1 F (36.7 C)  TempSrc:   Oral Axillary  SpO2: 92% 92% 97% 95%  Weight:   (!) 142.7 kg (314 lb 9.5 oz)   Height:   6\' 2"  (1.88 m)     Intake/Output Summary (Last 24 hours) at 05/13/2017 0830 Last data filed at 05/13/2017 0533 Gross per 24 hour  Intake 1490 ml  Output 1876 ml  Net -386 ml   Filed Weights   05/13/17 0117  Weight: (!) 142.7 kg (314 lb 9.5 oz)    Examination:  eomi ncat pleasant in  nad cta b No added sound abd soft nd nt No gaurding, sutures and area in abdomen looks clean CN 2-12 intact  Data Reviewed: I have personally reviewed following labs and imaging studies  CBC: Recent Labs  Lab 05/12/17 1228 05/13/17 0321  WBC 17.8* 14.1*  NEUTROABS 15.3*  --   HGB 13.0 11.2*  HCT 39.5 34.8*  MCV 86.1 86.4  PLT 378 427   Basic  Metabolic Panel: Recent Labs  Lab 05/12/17 1228 05/13/17 0321  NA 134* 137  K 4.1 3.6  CL 102 104  CO2 22 23  GLUCOSE 146* 150*  BUN 13 14  CREATININE 1.40* 1.35*  CALCIUM 9.0 8.6*   GFR: Estimated Creatinine Clearance: 89.8 mL/min (A) (by C-G formula based on SCr of 1.35 mg/dL (H)). Liver Function Tests: Recent Labs  Lab 05/12/17 1228  AST 15  ALT 12*  ALKPHOS 84  BILITOT 0.7  PROT 7.5  ALBUMIN 2.8*   No results for input(s): LIPASE, AMYLASE in the last 168 hours. No results for input(s): AMMONIA in the last 168 hours. Coagulation Profile: No results for input(s): INR, PROTIME in the last 168 hours. Cardiac Enzymes: No results for input(s): CKTOTAL, CKMB, CKMBINDEX, TROPONINI in the last 168 hours. BNP (last 3 results) No results for input(s): PROBNP in the last 8760 hours. HbA1C: No results for input(s): HGBA1C in the last 72 hours. CBG: Recent Labs  Lab 05/12/17 2221 05/13/17 0230 05/13/17 0741  GLUCAP 97 143* 130*   Lipid Profile: No results for input(s): CHOL, HDL, LDLCALC, TRIG, CHOLHDL, LDLDIRECT in the last 72 hours. Thyroid Function Tests: No results for input(s): TSH, T4TOTAL, FREET4, T3FREE, THYROIDAB in the last 72 hours. Anemia Panel: No results for input(s): VITAMINB12, FOLATE, FERRITIN, TIBC, IRON, RETICCTPCT in the last 72 hours. Urine analysis:    Component Value Date/Time   COLORURINE YELLOW 04/26/2017 0428   APPEARANCEUR CLOUDY (A) 04/26/2017 0428   LABSPEC 1.020 05/12/2017 1047   PHURINE 5.5 05/12/2017 1047   GLUCOSEU NEGATIVE 05/12/2017 1047   HGBUR MODERATE (A) 05/12/2017 1047   BILIRUBINUR NEGATIVE 05/12/2017 1047   KETONESUR NEGATIVE 05/12/2017 1047   PROTEINUR NEGATIVE 05/12/2017 1047   UROBILINOGEN 1.0 05/12/2017 1047   NITRITE POSITIVE (A) 05/12/2017 1047   LEUKOCYTESUR SMALL (A) 05/12/2017 1047     Radiology Studies: Reviewed images personally in health database    Scheduled Meds: . apixaban  5 mg Oral BID  .  insulin aspart  0-9 Units Subcutaneous TID WC  . scopolamine  1 patch Transdermal Q72H  . scopolamine       Continuous Infusions: . sodium chloride    . cefTRIAXone (ROCEPHIN)  IV Stopped (05/13/17 0110)     LOS: 1 day    Time spent: [place], MD [facility]   If 7PM-7AM, please contact night-coverage [email or web address] Password [email or web address] 05/13/2017, 8:30 AM

## 2017-05-13 NOTE — H&P (Addendum)
History and Physical    Evan Leonard RSW:[id number] DOB: 1961/01/17 DOA: 05/12/2017  Referring MD/NP/PA: Dr. [name] PCP: [name], MD  Patient coming from: Home  Chief Complaint: Right-sided kidney pain  I have personally briefly reviewed patient's old medical records in [place]   HPI: Evan Leonard is a 56 y.o. male with medical history significant of DM type II, PE on chronic anticoagulation, nephrolithiasis; who presents with complaints of right-sided kidney pain.  Patient reports having intermittent sharp stabbing pain on his right flank for the last 3 days.  Associated symptoms include low-grade fevers 99 F, decreased urine output, and nausea.  Denies having chest pain, vomiting, diarrhea, or dysuria.  Patient was just recently hospitalized for incarcerated ventral hernia for which he underwent ventral hernia repair and was hospitalized from 12/1- 12/4.  He reports incision otherwise had been healing well.   ED Course: Upon admission to the emergency department patient was seen to be febrile up to 100.2 F, heart rate 89-123, respirations 16-26, blood pressures 116/75-161/95, and O2 saturations maintained.  Labs revealed WBC 17.8, creatinine 1.4, BUN 13, TSH 0.88.  Urinalysis revealed small leukocytes with positive nitrites.  CT scan of the abdomen revealed a 14 mm calculus at the UPJ junction with moderate right hydronephrosis with perinephric stranding, 11 mm calculus calculus, recurrent supraumbilical hernia containing nondilated loops of bowel without obstruction, and 2.8 x 3 0.9 x 4 x 9 cm fluid collection in the anterior abdominal wall site concerning for possible seroma/possible abscess. Patient was given 2 g of Rocephin IV and a liter of IV fluids. Dr. [name] urology was consulted and took the patient to the operating room for placement of renal stent placement.  Review of Systems  Constitutional: Positive for fever. Negative for malaise/fatigue and weight loss.   HENT: Negative for ear pain and nosebleeds.   Eyes: Negative for photophobia and pain.  Respiratory: Negative for cough and hemoptysis.   Cardiovascular: Negative for chest pain and leg swelling.  Gastrointestinal: Positive for nausea. Negative for abdominal pain and vomiting.  Genitourinary: Positive for flank pain. Negative for dysuria and frequency.  Musculoskeletal: Negative for falls and myalgias.  Skin: Negative for itching and rash.  Neurological: Negative for speech change and focal weakness.  Psychiatric/Behavioral: Negative for substance abuse and suicidal ideas.     Past Medical History:  Diagnosis Date  . Diabetes mellitus, type II ([place]) 02/2013  . DVT (deep venous thrombosis) ([place]) 05/04/2012   LLE  . Exertional dyspnea 05/04/2012   "isolated episode" (05/05/2012)  . Hepatic steatosis   . History of chickenpox   . Obesity   . OSA on CPAP   . Pulmonary embolism ([place]) 05/04/2012   bilaterally  . Small bowel obstruction ([place]) 09/06/2015  . Varicose veins     Past Surgical History:  Procedure Laterality Date  . HERNIA REPAIR     2015  . LAPAROTOMY N/A 12/13/2013   Procedure: EXPLORATORY LAPAROTOMY Repair ventral hernia, without mesh, partial omentectomy;  Surgeon: Gwenyth Ober, MD;  Location: [facility];  Service: General;  Laterality: N/A;  . VENTRAL HERNIA REPAIR N/A 04/26/2017   Procedure: INCARCERATED HERNIA REPAIR VENTRAL ADULT;  Surgeon: Georganna Skeans, MD;  Location: [facility];  Service: General;  Laterality: N/A;     reports that  has never smoked. he has never used smokeless tobacco. He reports that he does not drink alcohol or use drugs.  No Known Allergies  Family History  Problem Relation Age of Onset  .  Diabetes Mother        died age 78- also bleeding ulcer  . Hypertension Mother   . Heart failure Father        in his 68s (patient was 7). day before surgery planned  . Other Maternal Grandmother        states natural causes all grandparents    Prior  to Admission medications   Medication Sig Start Date End Date Taking? Authorizing Provider  ELIQUIS 5 MG TABS tablet TAKE 1 TABLET TWICE A DAY 10/28/16  Yes [name], MD  furosemide (LASIX) 40 MG tablet Take 1 tablet (40 mg total) by mouth daily. 06/23/15  Yes Laurey Morale, MD  ibuprofen (ADVIL,MOTRIN) 200 MG tablet Take 600 mg by mouth every 6 (six) hours as needed for mild pain.   Yes [provider]  metFORMIN (GLUCOPHAGE) 500 MG tablet TAKE 2 TABLETS (1,000 MG TOTAL) BY MOUTH 2 (TWO) TIMES DAILY WITH A MEAL. 01/28/17  Yes [name], MD  Multiple Vitamin (MULTIVITAMIN WITH MINERALS) TABS tablet Take 1 tablet by mouth daily.    Yes [provider]  ondansetron (ZOFRAN ODT) 4 MG disintegrating tablet Take 1 tablet (4 mg total) by mouth every 8 (eight) hours as needed for nausea or vomiting. 07/19/16  Yes Rai, Ripudeep K, MD  OVER THE COUNTER MEDICATION Apply 1 application topically daily. Medication: Bag Balm. Patient states he apply to his legs every day for itching.   Yes [provider]  oxyCODONE (OXY IR/ROXICODONE) 5 MG immediate release tablet Take 1 tablet (5 mg total) by mouth every 4 (four) hours as needed for moderate pain or severe pain (5mg  for moderate pain, 10mg  for severe pain). 04/29/17  Yes Focht, Jessica L, PA  senna-docusate (SENOKOT S) 8.6-50 MG tablet Take 1 tablet by mouth at bedtime as needed for mild constipation. Also available OTC 07/19/16  Yes Rai, Vernelle Emerald, MD    Physical Exam:  Constitutional: Obese male who appears to be in no acute distress Vitals:   05/12/17 2315 05/12/17 2330 05/12/17 2345 05/13/17 0000  BP: 124/79 124/80 128/70 131/77  Pulse: 86 81 81 83  Resp: (!) 24 (!) 22 (!) 23 (!) 25  Temp:    98.1 F (36.7 C)  TempSrc:      SpO2: 92% 92% 93% 95%   Eyes: PERRL, lids and conjunctivae normal ENMT: Mucous membranes are moist. Posterior pharynx clear of any exudate or lesions.  Neck: normal, supple, no masses, no  thyromegaly Respiratory: Mildly tachypneic.  No significant wheezes appreciated at this time. Cardiovascular: Regular rate and rhythm, no murmurs / rubs / gallops. No extremity edema. 2+ pedal pulses. No carotid bruits.  Abdomen: Abdominal binder present with positive bowel sounds. Musculoskeletal: no clubbing / cyanosis. No joint deformity upper and lower extremities. Good ROM, no contractures. Normal muscle tone.  Skin: Staples present of the abdomen from previous incision with only minimal erythema noted. Neurologic: CN 2-12 grossly intact. Sensation intact, DTR normal. Strength 5/5 in all 4.  Psychiatric: Normal judgment and insight. Alert and oriented x 3. Normal mood.     Labs on Admission: I have personally reviewed following labs and imaging studies  CBC: Recent Labs  Lab 05/12/17 1228  WBC 17.8*  NEUTROABS 15.3*  HGB 13.0  HCT 39.5  MCV 86.1  PLT 093   Basic Metabolic Panel: Recent Labs  Lab 05/12/17 1228  NA 134*  K 4.1  CL 102  CO2 22  GLUCOSE 146*  BUN 13  CREATININE 1.40*  CALCIUM 9.0   GFR: CrCl cannot be calculated (Unknown ideal weight.). Liver Function Tests: Recent Labs  Lab 05/12/17 1228  AST 15  ALT 12*  ALKPHOS 84  BILITOT 0.7  PROT 7.5  ALBUMIN 2.8*   No results for input(s): LIPASE, AMYLASE in the last 168 hours. No results for input(s): AMMONIA in the last 168 hours. Coagulation Profile: No results for input(s): INR, PROTIME in the last 168 hours. Cardiac Enzymes: No results for input(s): CKTOTAL, CKMB, CKMBINDEX, TROPONINI in the last 168 hours. BNP (last 3 results) No results for input(s): PROBNP in the last 8760 hours. HbA1C: No results for input(s): HGBA1C in the last 72 hours. CBG: Recent Labs  Lab 05/12/17 2221  GLUCAP 97   Lipid Profile: No results for input(s): CHOL, HDL, LDLCALC, TRIG, CHOLHDL, LDLDIRECT in the last 72 hours. Thyroid Function Tests: No results for input(s): TSH, T4TOTAL, FREET4, T3FREE, THYROIDAB in  the last 72 hours. Anemia Panel: No results for input(s): VITAMINB12, FOLATE, FERRITIN, TIBC, IRON, RETICCTPCT in the last 72 hours. Urine analysis:    Component Value Date/Time   COLORURINE YELLOW 04/26/2017 0428   APPEARANCEUR CLOUDY (A) 04/26/2017 0428   LABSPEC 1.020 05/12/2017 1047   PHURINE 5.5 05/12/2017 1047   GLUCOSEU NEGATIVE 05/12/2017 1047   HGBUR MODERATE (A) 05/12/2017 1047   BILIRUBINUR NEGATIVE 05/12/2017 1047   KETONESUR NEGATIVE 05/12/2017 1047   PROTEINUR NEGATIVE 05/12/2017 1047   UROBILINOGEN 1.0 05/12/2017 1047   NITRITE POSITIVE (A) 05/12/2017 1047   LEUKOCYTESUR SMALL (A) 05/12/2017 1047   Sepsis Labs: No results found for this or any previous visit (from the past 240 hour(s)).   Radiological Exams on Admission: Dg Chest 2 View  Result Date: 05/12/2017 CLINICAL DATA:  Fever and tachypnea EXAM: CHEST  2 VIEW COMPARISON:  06/15/2015 FINDINGS: Minor atelectasis at the left base. Lung volumes are low. Normal heart size and mediastinal contours. There is no edema, consolidation, effusion, or pneumothorax. IMPRESSION: Minimal atelectasis at the left base.  No visible pneumonia. Electronically Signed   By: Monte Fantasia M.D.   On: 05/12/2017 11:04   Ct Renal Stone Study  Result Date: 05/12/2017 CLINICAL DATA:  Fevers, chills, and nausea. Right flank pain. Recent hernia repair. EXAM: CT ABDOMEN AND PELVIS WITHOUT CONTRAST TECHNIQUE: Multidetector CT imaging of the abdomen and pelvis was performed following the standard protocol without IV contrast. COMPARISON:  CT abdomen pelvis dated April 26, 2017. FINDINGS: Lower chest: No acute abnormality. Hepatobiliary: Hepatic steatosis. The gallbladder is unremarkable. No biliary dilatation. Pancreas: Unremarkable. No pancreatic ductal dilatation or surrounding inflammatory changes. Spleen: Normal in size without focal abnormality. Adrenals/Urinary Tract: The adrenal glands are unremarkable. The previously seen 14 mm  calculus in the midpole of the right kidney is now located at the UPJ, with resultant moderate right hydronephrosis and prominent surrounding perinephric fat stranding. The previously seen 11 mm calculus in the proximal right ureter is now located in the distal right ureter. Additional nonobstructive right renal calculi are unchanged. Punctate nonobstructive calculi in the left kidney are unchanged. No left hydronephrosis. The bladder is unremarkable. Stomach/Bowel: Stomach is within normal limits. Appendix appears normal. No evidence of bowel wall thickening, distention, or inflammatory changes. Vascular/Lymphatic: No significant vascular findings. Mildly enlarged right external iliac and inguinal lymph nodes appear slightly increased in size when compared to prior study, measuring up to 1.6 cm in short axis. Reproductive: The prostate is borderline enlarged, similar to prior study. Other: Recurrent supraumbilical hernia  containing nondilated loops of small bowel and fat. There is a 2.8 x 3.9 x 4.9 cm fluid collection in the anterior abdominal wall at the site of recent incision. No pneumoperitoneum or free fluid. Musculoskeletal: No acute or significant osseous findings. IMPRESSION: 1. Previously seen 14 mm calculus in the midpole of the right kidney is now located at the right UPJ, resulting in moderate right hydronephrosis. Prominent right perinephric fat stranding could reflect pyelonephritis given clinical history, although evaluation is limited without intravenous contrast. 2. The previously seen 11 mm calculus in the proximal right ureter is now located in the distal right ureter. There is moderate right ureteral dilatation. 3. Recurrent supraumbilical hernia containing nondilated loops of small bowel. No evidence of bowel obstruction. 4. 2.8 x 3.9 x 4.9 cm fluid collection in the anterior abdominal wall at the site of recent incision. This may reflect a small seroma, however, an abscess could have a similar  appearance. 5. Mildly enlarged right external iliac and inguinal lymph nodes appear slightly increased in size when compared to prior study, measuring up to 1.6 cm in short axis. These are nonspecific, and further evaluation with follow-up CT in 3 months is recommended. 6. Hepatic steatosis, unchanged. Electronically Signed   By: Titus Dubin M.D.   On: 05/12/2017 18:24    EKG: Independently reviewed.   Assessment/Plan Sepsis 2/2 pyelonephritis with obstructing renal calculus and right hydronephrosis: Acute.  Patient presented with right-sided flank pain.  CT scan shows acute obstructing right sided calculi. Dr. [name] of urology consulted and took patient to OR for with stent placement. - Admit to a MedSurg bed - Sepsis protocol initiated - Check blood cultures and urine - Continue IV antibiotics of Rocephin - Appreciate urology consultative services will follow-up for further recommendations  Recurrent supraumbilical hernia with fluid collection: As seen on CT scan of abdomen, but no signs of obstruction at this time.  Question the noted fluid collection. - Notify general surgery in a.m.  Diabetes mellitus type 2 - Hypoglycemic protocol - Hold metformin  - CBGs with sensitive sliding scale insulin as needed   History of DVT/PE - Continue Eliquis DVT prophylaxis: Continue Eliquis Code Status: full Family Communication: Family present at bedside Disposition Plan: tbd  Consults called: urology Admission status: inpatient  Norval Morton MD [facility] Pager [phone]   If 7PM-7AM, please contact night-coverage [email or web address] Password [email or web address]  05/13/2017, 12:21 AM

## 2017-05-13 NOTE — Progress Notes (Signed)
Pt has hx of sleep apnea on call physician Baltazar Najjar has been notified to put an order for C-PAP,  Bill from respiratory informed

## 2017-05-13 NOTE — Plan of Care (Signed)
Progressing no new complain  

## 2017-05-13 NOTE — Progress Notes (Signed)
1 Day Post-Op  Subjective: Mr. Dickerman is POD 1 from placement of a right ureteral stent for proximal and distal ureteral stones with AKI and fever.  He is afebrile this morning with reduced pain.  He had some urethral bleeding post op from mild stricture disease but the urine is clear today.  ROS:  Review of Systems  Constitutional: Negative for fever.  Gastrointestinal: Negative for nausea.  Genitourinary: Positive for flank pain (improved).    Anti-infectives: Anti-infectives (From admission, onward)   Start     Dose/Rate Route Frequency Ordered Stop   05/13/17 1700  cefTRIAXone (ROCEPHIN) 1 g in dextrose 5 % 50 mL IVPB     1 g 100 mL/hr over 30 Minutes Intravenous Every 24 hours 05/12/17 2234     05/12/17 1715  cefTRIAXone (ROCEPHIN) 2 g in dextrose 5 % 50 mL IVPB     2 g 100 mL/hr over 30 Minutes Intravenous  Once 05/12/17 1710 05/12/17 1914      Current Facility-Administered Medications  Medication Dose Route Frequency Provider Last Rate Last Dose  . 0.9 %  sodium chloride infusion   Intravenous Continuous Smith, Rondell A, MD      . acetaminophen (TYLENOL) tablet 650 mg  650 mg Oral Q6H PRN Norval Morton, MD       Or  . acetaminophen (TYLENOL) suppository 650 mg  650 mg Rectal Q6H PRN Fuller Plan A, MD      . apixaban (ELIQUIS) tablet 5 mg  5 mg Oral BID Smith, Rondell A, MD      . cefTRIAXone (ROCEPHIN) 1 g in dextrose 5 % 50 mL IVPB  1 g Intravenous Q24H Pat Patrick, RPH   Stopped at 05/13/17 0110  . insulin aspart (novoLOG) injection 0-9 Units  0-9 Units Subcutaneous TID WC Smith, Rondell A, MD      . ondansetron (ZOFRAN) tablet 4 mg  4 mg Oral Q6H PRN Fuller Plan A, MD       Or  . ondansetron (ZOFRAN) injection 4 mg  4 mg Intravenous Q6H PRN Smith, Rondell A, MD      . ondansetron (ZOFRAN-ODT) disintegrating tablet 4 mg  4 mg Oral Once PRN Pattricia Boss, MD      . oxyCODONE (Oxy IR/ROXICODONE) immediate release tablet 5-10 mg  5-10 mg Oral Q4H PRN Smith,  Rondell A, MD      . scopolamine (TRANSDERM-SCOP) 1 MG/3DAYS 1.5 mg  1 patch Transdermal Q72H Duane Boston, MD      . scopolamine (TRANSDERM-SCOP) 1 MG/3DAYS           . senna-docusate (Senokot-S) tablet 1 tablet  1 tablet Oral QHS PRN Fuller Plan A, MD         Objective: Vital signs in last 24 hours: Temp:  [98.1 F (36.7 C)-100.2 F (37.9 C)] 98.1 F (36.7 C) (12/18 0531) Pulse Rate:  [78-123] 78 (12/18 0531) Resp:  [16-26] 20 (12/18 0531) BP: (116-161)/(62-95) 121/72 (12/18 0531) SpO2:  [91 %-100 %] 95 % (12/18 0531) Weight:  [142.7 kg (314 lb 9.5 oz)] 142.7 kg (314 lb 9.5 oz) (12/18 0117)  Intake/Output from previous day: 12/17 0701 - 12/18 0700 In: 1490 [P.O.:240; I.V.:1200; IV Piggyback:50] Out: 0962 [EZMOQ:9476; Blood:1] Intake/Output this shift: No intake/output data recorded.   Physical Exam  Constitutional: He is oriented to person, place, and time.  Obese, WD in NAD  Genitourinary:  Genitourinary Comments: Foley draining clear urine.   Neurological: He is alert and oriented to person,  place, and time.  Vitals reviewed.   Lab Results:  Recent Labs    05/12/17 1228 05/13/17 0321  WBC 17.8* 14.1*  HGB 13.0 11.2*  HCT 39.5 34.8*  PLT 378 304   BMET Recent Labs    05/12/17 1228 05/13/17 0321  NA 134* 137  K 4.1 3.6  CL 102 104  CO2 22 23  GLUCOSE 146* 150*  BUN 13 14  CREATININE 1.40* 1.35*  CALCIUM 9.0 8.6*   PT/INR No results for input(s): LABPROT, INR in the last 72 hours. ABG No results for input(s): PHART, HCO3 in the last 72 hours.  Invalid input(s): PCO2, PO2  Studies/Results: Dg Chest 2 View  Result Date: 05/12/2017 CLINICAL DATA:  Fever and tachypnea EXAM: CHEST  2 VIEW COMPARISON:  06/15/2015 FINDINGS: Minor atelectasis at the left base. Lung volumes are low. Normal heart size and mediastinal contours. There is no edema, consolidation, effusion, or pneumothorax. IMPRESSION: Minimal atelectasis at the left base.  No visible  pneumonia. Electronically Signed   By: Monte Fantasia M.D.   On: 05/12/2017 11:04   Ct Renal Stone Study  Result Date: 05/12/2017 CLINICAL DATA:  Fevers, chills, and nausea. Right flank pain. Recent hernia repair. EXAM: CT ABDOMEN AND PELVIS WITHOUT CONTRAST TECHNIQUE: Multidetector CT imaging of the abdomen and pelvis was performed following the standard protocol without IV contrast. COMPARISON:  CT abdomen pelvis dated April 26, 2017. FINDINGS: Lower chest: No acute abnormality. Hepatobiliary: Hepatic steatosis. The gallbladder is unremarkable. No biliary dilatation. Pancreas: Unremarkable. No pancreatic ductal dilatation or surrounding inflammatory changes. Spleen: Normal in size without focal abnormality. Adrenals/Urinary Tract: The adrenal glands are unremarkable. The previously seen 14 mm calculus in the midpole of the right kidney is now located at the UPJ, with resultant moderate right hydronephrosis and prominent surrounding perinephric fat stranding. The previously seen 11 mm calculus in the proximal right ureter is now located in the distal right ureter. Additional nonobstructive right renal calculi are unchanged. Punctate nonobstructive calculi in the left kidney are unchanged. No left hydronephrosis. The bladder is unremarkable. Stomach/Bowel: Stomach is within normal limits. Appendix appears normal. No evidence of bowel wall thickening, distention, or inflammatory changes. Vascular/Lymphatic: No significant vascular findings. Mildly enlarged right external iliac and inguinal lymph nodes appear slightly increased in size when compared to prior study, measuring up to 1.6 cm in short axis. Reproductive: The prostate is borderline enlarged, similar to prior study. Other: Recurrent supraumbilical hernia containing nondilated loops of small bowel and fat. There is a 2.8 x 3.9 x 4.9 cm fluid collection in the anterior abdominal wall at the site of recent incision. No pneumoperitoneum or free fluid.  Musculoskeletal: No acute or significant osseous findings. IMPRESSION: 1. Previously seen 14 mm calculus in the midpole of the right kidney is now located at the right UPJ, resulting in moderate right hydronephrosis. Prominent right perinephric fat stranding could reflect pyelonephritis given clinical history, although evaluation is limited without intravenous contrast. 2. The previously seen 11 mm calculus in the proximal right ureter is now located in the distal right ureter. There is moderate right ureteral dilatation. 3. Recurrent supraumbilical hernia containing nondilated loops of small bowel. No evidence of bowel obstruction. 4. 2.8 x 3.9 x 4.9 cm fluid collection in the anterior abdominal wall at the site of recent incision. This may reflect a small seroma, however, an abscess could have a similar appearance. 5. Mildly enlarged right external iliac and inguinal lymph nodes appear slightly increased in size when compared to  prior study, measuring up to 1.6 cm in short axis. These are nonspecific, and further evaluation with follow-up CT in 3 months is recommended. 6. Hepatic steatosis, unchanged. Electronically Signed   By: Titus Dubin M.D.   On: 05/12/2017 18:24     Assessment and Plan: Right ureteral stones with infection doing well post stenting.   He will need ureteroscopy with laser in 1-2 weeks for definitive stone management.  I will have my office schedule.       LOS: 1 day    [name] 05/13/2017 [phone] ID: [name], male   DOB: March 03, 1961, 56 y.o.   MRN: [id number]

## 2017-05-13 NOTE — Progress Notes (Signed)
Pr received from PACU  Alert and oriented on arrival to the floor, vital signs stable, has scopolamine patch behind the rt ear, has blt lower leg edema, cellulitis at the rt leg,skin is dry and flaky,  Incision at the mid abdomen and has abdominal binder in place for his hernia repair in Dec 2018,sterile strip at the lower of incision site, all meds given at pacu pt provided with snack, will continue to monitor pt

## 2017-05-13 NOTE — Progress Notes (Signed)
Foley removed per verbal order from Dr. [name]. Tolerated well.

## 2017-05-14 ENCOUNTER — Other Ambulatory Visit: Payer: Self-pay

## 2017-05-14 ENCOUNTER — Encounter (HOSPITAL_COMMUNITY): Payer: Self-pay | Admitting: General Practice

## 2017-05-14 LAB — GLUCOSE, CAPILLARY
Glucose-Capillary: 121 mg/dL — ABNORMAL HIGH (ref 65–99)
Glucose-Capillary: 103 mg/dL — ABNORMAL HIGH (ref 65–99)
Glucose-Capillary: 108 mg/dL — ABNORMAL HIGH (ref 65–99)
Glucose-Capillary: 112 mg/dL — ABNORMAL HIGH (ref 65–99)

## 2017-05-14 LAB — BASIC METABOLIC PANEL
Anion gap: 9 (ref 5–15)
BUN: 16 mg/dL (ref 6–20)
CO2: 24 mmol/L (ref 22–32)
Calcium: 8.6 mg/dL — ABNORMAL LOW (ref 8.9–10.3)
Chloride: 106 mmol/L (ref 101–111)
Creatinine, Ser: 1.26 mg/dL — ABNORMAL HIGH (ref 0.61–1.24)
GFR calc Af Amer: >60 mL/min (ref >60)
GFR calc non Af Amer: >60 mL/min (ref >60)
Glucose, Bld: 141 mg/dL — ABNORMAL HIGH (ref 65–99)
Potassium: 3.9 mmol/L (ref 3.5–5.1)
Sodium: 139 mmol/L (ref 135–145)

## 2017-05-14 LAB — CBC WITH DIFFERENTIAL/PLATELET
Basophils Absolute: 0 10*3/uL (ref 0.0–0.1)
Basophils Relative: 0 %
Eosinophils Absolute: 0.2 10*3/uL (ref 0.0–0.7)
Eosinophils Relative: 2 %
HCT: 32.7 % — ABNORMAL LOW (ref 39.0–52.0)
Hemoglobin: 10.3 g/dL — ABNORMAL LOW (ref 13.0–17.0)
Lymphocytes Relative: 25 %
Lymphs Abs: 2.7 10*3/uL (ref 0.7–4.0)
MCH: 27.3 pg (ref 26.0–34.0)
MCHC: 31.5 g/dL (ref 30.0–36.0)
MCV: 86.7 fL (ref 78.0–100.0)
Monocytes Absolute: 1 10*3/uL (ref 0.1–1.0)
Monocytes Relative: 9 %
Neutro Abs: 6.7 10*3/uL (ref 1.7–7.7)
Neutrophils Relative %: 64 %
Platelets: 317 10*3/uL (ref 150–400)
RBC: 3.77 MIL/uL — ABNORMAL LOW (ref 4.22–5.81)
RDW: 14.1 % (ref 11.5–15.5)
WBC: 10.7 10*3/uL — ABNORMAL HIGH (ref 4.0–10.5)

## 2017-05-14 LAB — URINE CULTURE: Culture: NO GROWTH

## 2017-05-14 NOTE — Progress Notes (Signed)
Surgery was asked to see pt at the request of Internal medicine after incidental finding on CT scan of fluid collection in the anterior abdominal wall at the site of recent incision. Examination of abdominal wall showed no areas of fluctuance, redness or increased warmth to suggest abscess or infectious process. Midline incision appears well healing with area of firmness at superior aspect of wound likely 2/2 scar tissue. Pt's abdomen is nontender and he is tolerating his diet and having regular bowel movements. It is difficult to fully assess the pt's abdomen due to his body habitus but I do not suspect this is an abscess. Nothing to do at this time.   Jackson Latino, [facility] Surgery Pager [phone]

## 2017-05-14 NOTE — Progress Notes (Signed)
PROGRESS NOTE    Evan Leonard  LGX:[id number] DOB: 06-Feb-1961 DOA: 05/12/2017 PCP: [name], MD   Specialists:  Dr. [name] oncology Dr. Gwenette Greet sleep   Brief Narrative:  56 year old male Diabetes mellitus type 2 Pulmonary emboli initially diagnosed 05/07/2012 on chronic anticoagulation with Eliquis also coincidental--left distal popliteal DVT--was previously followed by Dr. [name] of oncology-was supposed to be on aspirin 182  after 1 year from initial diagnosis Nephrolithiasis  history of small bowel obstruction 07/19/16 Morbid obesity BMI >40 OSA on CPAP? Prior recurrent lower extremity cellulitis Prior exploratory laparotomy strangulated umbilical hernia 01/07/4817 without mesh Prior SBO 08/2015 without operation  12/1 had incarcerated ventral hernia CT scan showed 8 mm right proximal ureteric stone with hydronephrosis and underwent ventral repair  Admitted with worsening right flank pain subjective fever 2-3 days prior to admission 12/17-tachycardia 110, low-grade fever 100.2, WBC 17, UA + nitrates CT =2 stones in right ureter 14 mm stone right UPJ, 11 mm stone distal ureter +2.8 X3.9X 4.9?  Seroma from prior surgery  Underwent emergent ureteric stent placement on the right with cystoscopy, fossa navicularis stricture dilatation from the right ureteric stent placement 6 French X 26 cm double-J stent  Assessment & Plan:   Active Problems:   History of pulmonary embolus (PE)   Renal stone   Acute pyelonephritis   Hydronephrosis of right kidney   pyelonephritis with obstructing calculi Status post cystoscopy stent placement Dr. Roni Bread 12/17 Await BC x 2 12/17-continue ceftriaxone every 24, await urology further input Continue oxycodone 5-10 mg every 4 as needed pain Keep saline 100 cc/h for now  Prior PE/DVT in 2013 Continue for now Eliquis 5 mg twice daily previously was on Xarelto Outpatient discussion with his oncologist Dr. [name] Resume anticoagulation as  per urologist  Recent ventral hernia surgery 12/1 Seroma 2.8 X 3.9X 4.9 cm Gen surgery did follow patient on 12/19--no role for any work-up of Seroma-do not feel infected etc OP suregry follow up as prn  OSA on CPAP Morbid obesity Previously was thought to be a good candidate for gastric bypass surgery Continue CPAP/BiPAP nightly as needed Lasix 40 prior to admission has been held  Diabetes mellitus type 2 CBG 103-141, holding Glucophage 1 g twice daily for now Continue sliding scale coverage sensitive  Constipation continue senna 1 tab for mild constipation     DVT prophylaxis: on Apixaban Code Status: Full Family Communication:  None bedside Disposition Plan: inpatient   Consultants:   none  Procedures:   None   Antimicrobials:   Ceftriaxone    Subjective:  No pain Doing well No issues-no fever  tol diet Ambulatory to some exent Cult not back just yet   Objective: Vitals:   05/13/17 0531 05/13/17 1457 05/13/17 2123 05/14/17 0553  BP: 121/72 116/67 114/67 126/70  Pulse: 78 90 78 64  Resp: 20 20 18 18   Temp: 98.1 F (36.7 C) 98.5 F (36.9 C) 98.6 F (37 C) 97.8 F (36.6 C)  TempSrc: Axillary Oral Oral Oral  SpO2: 95% 96% 96% 94%  Weight:      Height:        Intake/Output Summary (Last 24 hours) at 05/14/2017 1332 Last data filed at 05/14/2017 1321 Gross per 24 hour  Intake 1640 ml  Output 2300 ml  Net -660 ml   Filed Weights   05/13/17 0117  Weight: (!) 142.7 kg (314 lb 9.5 oz)    Examination:  eomi ncat pleasant in nad cta b No added sound abd  soft nd nt No gaurding, abd soft-sutures clean--no tenderness no rebound CN 2-12 intact  Data Reviewed: I have personally reviewed following labs and imaging studies  CBC: Recent Labs  Lab 05/12/17 1228 05/13/17 0321 05/14/17 0253  WBC 17.8* 14.1* 10.7*  NEUTROABS 15.3*  --  6.7  HGB 13.0 11.2* 10.3*  HCT 39.5 34.8* 32.7*  MCV 86.1 86.4 86.7  PLT 378 304 235   Basic  Metabolic Panel: Recent Labs  Lab 05/12/17 1228 05/13/17 0321 05/14/17 0253  NA 134* 137 139  K 4.1 3.6 3.9  CL 102 104 106  CO2 22 23 24   GLUCOSE 146* 150* 141*  Leonard 13 14 16   CREATININE 1.40* 1.35* 1.26*  CALCIUM 9.0 8.6* 8.6*   GFR: Estimated Creatinine Clearance: 96.2 mL/min (A) (by C-G formula based on SCr of 1.26 mg/dL (H)). Liver Function Tests: Recent Labs  Lab 05/12/17 1228  AST 15  ALT 12*  ALKPHOS 84  BILITOT 0.7  PROT 7.5  ALBUMIN 2.8*   No results for input(s): LIPASE, AMYLASE in the last 168 hours. No results for input(s): AMMONIA in the last 168 hours. Coagulation Profile: No results for input(s): INR, PROTIME in the last 168 hours. Cardiac Enzymes: No results for input(s): CKTOTAL, CKMB, CKMBINDEX, TROPONINI in the last 168 hours. BNP (last 3 results) No results for input(s): PROBNP in the last 8760 hours. HbA1C: No results for input(s): HGBA1C in the last 72 hours. CBG: Recent Labs  Lab 05/13/17 1210 05/13/17 1736 05/13/17 2119 05/14/17 0818 05/14/17 1157  GLUCAP 88 123* 109* 121* 103*   Lipid Profile: No results for input(s): CHOL, HDL, LDLCALC, TRIG, CHOLHDL, LDLDIRECT in the last 72 hours. Thyroid Function Tests: No results for input(s): TSH, T4TOTAL, FREET4, T3FREE, THYROIDAB in the last 72 hours. Anemia Panel: No results for input(s): VITAMINB12, FOLATE, FERRITIN, TIBC, IRON, RETICCTPCT in the last 72 hours. Urine analysis:    Component Value Date/Time   COLORURINE YELLOW 05/12/2017 1646   APPEARANCEUR HAZY (A) 05/12/2017 1646   LABSPEC 1.009 05/12/2017 1646   PHURINE 6.0 05/12/2017 1646   GLUCOSEU NEGATIVE 05/12/2017 1646   HGBUR MODERATE (A) 05/12/2017 1646   BILIRUBINUR NEGATIVE 05/12/2017 1646   KETONESUR NEGATIVE 05/12/2017 1646   PROTEINUR NEGATIVE 05/12/2017 1646   UROBILINOGEN 1.0 05/12/2017 1047   NITRITE NEGATIVE 05/12/2017 1646   LEUKOCYTESUR LARGE (A) 05/12/2017 1646     Radiology Studies: Reviewed images  personally in health database    Scheduled Meds: . apixaban  5 mg Oral BID  . insulin aspart  0-9 Units Subcutaneous TID WC  . scopolamine  1 patch Transdermal Q72H   Continuous Infusions: . sodium chloride 100 mL/hr at 05/14/17 0545  . cefTRIAXone (ROCEPHIN)  IV 1 g (05/13/17 1811)     LOS: 2 days    Time spent: [place], MD [facility]   If 7PM-7AM, please contact night-coverage [email or web address] Password [email or web address] 05/14/2017, 1:32 PM

## 2017-05-14 NOTE — Plan of Care (Signed)
No new complain, peeing with no difficulty

## 2017-05-14 NOTE — Evaluation (Signed)
Physical Therapy Evaluation Patient Details Name: Evan Leonard MRN: [id number] DOB: March 15, 1961 Today's Date: 05/14/2017   History of Present Illness  Evan Leonard is a 56 y.o. male with medical history significant of DM type II, PE on chronic anticoagulation, nephrolithiasis; who presents with complaints of right-sided kidney pain.  Work up includes sepsis secondary to pyelonephritis with obstructing calculi.  Status post cystoscopy stent placement Dr. Wilson Singer 12/17  Clinical Impression  Pt is at or close to baseline functioning and should be safe at home. There are no further acute PT needs.  Will sign off at this time.     Follow Up Recommendations No PT follow up    Equipment Recommendations  None recommended by PT    Recommendations for Other Services       Precautions / Restrictions Precautions Precautions: None Required Braces or Orthoses: (abdominal binder for hernia) Restrictions Weight Bearing Restrictions: No      Mobility  Bed Mobility               General bed mobility comments: NT already in the recliner  Transfers Overall transfer level: Independent                  Ambulation/Gait Ambulation/Gait assistance: Independent Ambulation Distance (Feet): 250 Feet Assistive device: None Gait Pattern/deviations: Step-through pattern   Gait velocity interpretation: at or above normal speed for age/gender General Gait Details: Steady with good age-appropriate speeds, wider BOS  Stairs            Wheelchair Mobility    Modified Rankin (Stroke Patients Only)       Balance Overall balance assessment: Independent                                           Pertinent Vitals/Pain Pain Assessment: No/denies pain    Home Living Family/patient expects to be discharged to:: Private residence Living Arrangements: Alone Available Help at Discharge: Family;Friend(s);Available PRN/intermittently Type of Home: Apartment Home  Access: Level entry     Home Layout: One level Home Equipment: Walker - 2 wheels;Bedside commode;Shower seat      Prior Function Level of Independence: Independent               Hand Dominance        Extremity/Trunk Assessment   Upper Extremity Assessment Upper Extremity Assessment: Overall WFL for tasks assessed    Lower Extremity Assessment Lower Extremity Assessment: Overall WFL for tasks assessed       Communication   Communication: No difficulties  Cognition Arousal/Alertness: Awake/alert Behavior During Therapy: WFL for tasks assessed/performed Overall Cognitive Status: Within Functional Limits for tasks assessed                                        General Comments      Exercises     Assessment/Plan    PT Assessment Patent does not need any further PT services  PT Problem List         PT Treatment Interventions      PT Goals (Current goals can be found in the Care Plan section)  Acute Rehab PT Goals PT Goal Formulation: All assessment and education complete, DC therapy    Frequency     Barriers to discharge  Co-evaluation               AM-PAC PT "6 Clicks" Daily Activity  Outcome Measure Difficulty turning over in bed (including adjusting bedclothes, sheets and blankets)?: None Difficulty moving from lying on back to sitting on the side of the bed? : None Difficulty sitting down on and standing up from a chair with arms (e.g., wheelchair, bedside commode, etc,.)?: None Help needed moving to and from a bed to chair (including a wheelchair)?: None Help needed walking in hospital room?: None Help needed climbing 3-5 steps with a railing? : None 6 Click Score: 24    End of Session   Activity Tolerance: Patient tolerated treatment well Patient left: in chair;with call bell/phone within reach Nurse Communication: Mobility status PT Visit Diagnosis: Other abnormalities of gait and mobility (R26.89)     Time: 1610-9604 PT Time Calculation (min) (ACUTE ONLY): 15 min   Charges:   PT Evaluation $PT Eval Low Complexity: 1 Low     PT G Codes:        May 17, 2017  McLean Bing, PT [phone]  (pager)  Evan Leonard 05/17/17, 12:03 PM

## 2017-05-14 NOTE — Progress Notes (Signed)
Pt appears to have passed 2 stones tonight in urine.

## 2017-05-15 ENCOUNTER — Encounter: Payer: Self-pay | Admitting: Family Medicine

## 2017-05-15 LAB — GLUCOSE, CAPILLARY
Glucose-Capillary: 133 mg/dL — ABNORMAL HIGH (ref 65–99)
Glucose-Capillary: 106 mg/dL — ABNORMAL HIGH (ref 65–99)

## 2017-05-15 MED ORDER — NITROFURANTOIN MACROCRYSTAL 100 MG PO CAPS: 100.0000 mg | ORAL_CAPSULE | Freq: Two times a day (BID) | ORAL | 0 refills | Status: AC

## 2017-05-15 NOTE — Discharge Summary (Signed)
Physician Discharge Summary  Evan Leonard AJO:[id number] DOB: 1961-05-26 DOA: 05/12/2017  PCP: [name], MD  Admit date: 05/12/2017 Discharge date: 05/15/2017  Time spent: 35 minutes  Recommendations for Outpatient Follow-up:  1. Complete Macrodantin 01/28/69 for complicated Urinary infection 2. Needs OP follow-up Dr. [name] for consideration of further cystoscopy and/or other planning 3. Need cbc + bmet 2 weeks 4. Note-have Dc buprofen this admit--may require re-initiation for the same on follow up with PCP 5. Would consider re-referral to Hematology regarding use of NOAC--has had a PE and DVT in the remote past but wanted to continue the same 6. Consider OP Gastric restrictive surgery ?  Discharge Diagnoses:  Active Problems:   History of pulmonary embolus (PE)   Renal stone   Acute pyelonephritis   Hydronephrosis of right kidney   Discharge Condition: improved  Diet recommendation: [facility]  Filed Weights   05/13/17 0117  Weight: (!) 142.7 kg (314 lb 9.5 oz)    History of present illness:  56 year old maleDiabetes mellitus type 2 Pulmonary emboli initially diagnosed 05/07/2012 on chronic anticoagulation with Eliquis also coincidental--left distal popliteal DVT--was previously followed by Dr. [name] of oncology-was supposed to be on aspirin 182  after 1 year from initial diagnosis Nephrolithiasis history of small bowel obstruction 07/19/16 Morbid obesity BMI >40 OSA on CPAP?Prior recurrent lower extremity cellulitis Prior exploratory laparotomy strangulated umbilical hernia 9/62/8366 without mesh Prior SBO 08/2015 without operation  12/1 had incarcerated ventral hernia CT scan showed 8 mm right proximal ureteric stone with hydronephrosis and underwent ventral repair  Admitted with worsening right flank pain subjective fever 2-3 days prior to admission 12/17-tachycardia 110, low-grade fever 100.2, WBC 17, UA + nitrates CT =2 stones in right ureter 14 mm stone right  UPJ, 11 mm stone distal ureter +2.8 X3.9X 4.9?  Seroma from prior surgery  Underwent emergent ureteric stent placement on the right with cystoscopy, fossa navicularis stricture dilatation from the right ureteric stent placement 6 French X 26 cm double-J stent    Hospital Course:  pyelonephritis with obstructing calculi Status post cystoscopy stent placement Dr. Roni Bread 12/17 Await BC x 2 12/17 and UC from admit was neg-received Ceftriaxone x 48 hour-narrowed to Macrodantin PO ending on 05/27/17-passed 2 small stones evening 12/20 OP Urology follow up needed  Prior PE/DVT in 2013 Continue for now Eliquis 5 mg twice daily previously was on Xarelto Outpatient discussion with his oncologist Dr. [name]  Recent ventral hernia surgery 12/1 Seroma 2.8 X 3.9X 4.9 cm Gen surgery did follow patient on 12/19--no role for any work-up of Seroma-do not feel infected etc--keep abd binder for ~ 10 days then stop  OSA on CPAP Morbid obesity Previously was thought to be a good candidate for gastric bypass surgery Continue CPAP/BiPAP nightly as needed Lasix 40 prior to admission has been held  Diabetes mellitus type 2 CBG 112-133, controlled on ssi in hosp-resumed Glucophage 1 g twice on d/c  Constipation continue senna 1 tab for mild constipation     Procedures:  Cystoscopy and JJ stent placed as above  Consultations:  Urology  Discharge Exam: Vitals:   05/14/17 2149 05/15/17 0457  BP: 133/74 127/72  Pulse: 75 69  Resp: 18 18  Temp: 98.5 F (36.9 C) (!) 97.4 F (36.3 C)  SpO2: 98% 97%    General: awake alert in nad Cardiovascular: s1 s2 no m/r/g Respiratory: clear no added sound  Abd soft nt nd no rebound no guard--wound is well healing-binger is on Chr RLE swelling  Discharge Instructions  Follow-up Information    [name], MD Follow up.   Specialty:  Urology Contact information: [address] [place] [address] [phone]           Allergies as of  05/15/2017   No Known Allergies     Medication List    STOP taking these medications   ibuprofen 200 MG tablet Commonly known as:  ADVIL,MOTRIN     TAKE these medications   ELIQUIS 5 MG Tabs tablet Generic drug:  apixaban TAKE 1 TABLET TWICE A DAY   furosemide 40 MG tablet Commonly known as:  LASIX Take 1 tablet (40 mg total) by mouth daily.   metFORMIN 500 MG tablet Commonly known as:  GLUCOPHAGE TAKE 2 TABLETS (1,000 MG TOTAL) BY MOUTH 2 (TWO) TIMES DAILY WITH A MEAL.   multivitamin with minerals Tabs tablet Take 1 tablet by mouth daily.   nitrofurantoin 100 MG capsule Commonly known as:  MACRODANTIN Take 1 capsule (100 mg total) by mouth 2 (two) times daily for 12 days.   ondansetron 4 MG disintegrating tablet Commonly known as:  ZOFRAN ODT Take 1 tablet (4 mg total) by mouth every 8 (eight) hours as needed for nausea or vomiting.   OVER THE COUNTER MEDICATION Apply 1 application topically daily. Medication: Bag Balm. Patient states he apply to his legs every day for itching.   oxyCODONE 5 MG immediate release tablet Commonly known as:  Oxy IR/ROXICODONE Take 1 tablet (5 mg total) by mouth every 4 (four) hours as needed for moderate pain or severe pain (5mg  for moderate pain, 10mg  for severe pain).   senna-docusate 8.6-50 MG tablet Commonly known as:  SENOKOT S Take 1 tablet by mouth at bedtime as needed for mild constipation. Also available OTC      No Known Allergies    The results of significant diagnostics from this hospitalization (including imaging, microbiology, ancillary and laboratory) are listed below for reference.    Significant Diagnostic Studies: Dg Chest 2 View  Result Date: 05/12/2017 CLINICAL DATA:  Fever and tachypnea EXAM: CHEST  2 VIEW COMPARISON:  06/15/2015 FINDINGS: Minor atelectasis at the left base. Lung volumes are low. Normal heart size and mediastinal contours. There is no edema, consolidation, effusion, or pneumothorax.  IMPRESSION: Minimal atelectasis at the left base.  No visible pneumonia. Electronically Signed   By: Monte Fantasia M.D.   On: 05/12/2017 11:04   Ct Abdomen Pelvis W Contrast  Result Date: 04/26/2017 CLINICAL DATA:  Centralized abdominal pain EXAM: CT ABDOMEN AND PELVIS WITH CONTRAST TECHNIQUE: Multidetector CT imaging of the abdomen and pelvis was performed using the standard protocol following bolus administration of intravenous contrast. CONTRAST:  63mL ISOVUE-300 IOPAMIDOL (ISOVUE-300) INJECTION 61% COMPARISON:  07/15/2016 FINDINGS: Lower chest: Lung bases are clear. No effusions. Heart is normal size. Hepatobiliary: Increased echotexture compatible with fatty infiltration. No focal abnormality or biliary ductal dilatation. Pancreas: No focal abnormality or ductal dilatation. Spleen: No focal abnormality.  Normal size. Adrenals/Urinary Tract: Mild right hydronephrosis due to an 8 mm proximal right ureteral stone. Multiple nonobstructing right renal stones, the largest in the midpole measuring 11 mm. Small nonobstructing punctate stone in the lower pole of the left kidney. Small exophytic cyst off the lower pole of the right kidney, stable. Adrenal glands and urinary bladder unremarkable. Stomach/Bowel: There is a supraumbilical hernia containing small bowel loops. Small bowel loops proximal to the hernia are dilated compatible with small bowel obstruction. Distal small bowel loops are decompressed. Scattered  sigmoid diverticula. No active diverticulitis. Vascular/Lymphatic: No evidence of aneurysm or adenopathy. Reproductive: No visible focal abnormality. Other: No free fluid or free air. Musculoskeletal: No acute bony abnormality. IMPRESSION: Supraumbilical midline ventral hernia containing small bowel loops with associated moderate small bowel obstruction. 8 mm proximal right ureteral stone with mild right hydronephrosis and delayed excretion of contrast from the right kidney. Fatty infiltration of the  liver. Electronically Signed   By: Rolm Baptise M.D.   On: 04/26/2017 10:25   Dg Cystogram  Result Date: 05/13/2017 CLINICAL DATA:  Right ureteral stent placement. EXAM: Intraoperative spot film. COMPARISON:  CT 05/12/2017 . TECHNIQUE: Spot film obtained following stent placement. FLUOROSCOPY TIME:  Fluoroscopy Time:  0 minutes 21 seconds Number of Acquired Spot Images: 1 CONTRAST:  None. FINDINGS: Double-J ureteral stent noted with its proximal tip in the right kidney. Lower portion not imaged. IMPRESSION: Right double-J ureteral stent noted as above. Electronically Signed   By: Marcello Moores  Register   On: 05/13/2017 08:08   Ct Renal Stone Study  Result Date: 05/12/2017 CLINICAL DATA:  Fevers, chills, and nausea. Right flank pain. Recent hernia repair. EXAM: CT ABDOMEN AND PELVIS WITHOUT CONTRAST TECHNIQUE: Multidetector CT imaging of the abdomen and pelvis was performed following the standard protocol without IV contrast. COMPARISON:  CT abdomen pelvis dated April 26, 2017. FINDINGS: Lower chest: No acute abnormality. Hepatobiliary: Hepatic steatosis. The gallbladder is unremarkable. No biliary dilatation. Pancreas: Unremarkable. No pancreatic ductal dilatation or surrounding inflammatory changes. Spleen: Normal in size without focal abnormality. Adrenals/Urinary Tract: The adrenal glands are unremarkable. The previously seen 14 mm calculus in the midpole of the right kidney is now located at the UPJ, with resultant moderate right hydronephrosis and prominent surrounding perinephric fat stranding. The previously seen 11 mm calculus in the proximal right ureter is now located in the distal right ureter. Additional nonobstructive right renal calculi are unchanged. Punctate nonobstructive calculi in the left kidney are unchanged. No left hydronephrosis. The bladder is unremarkable. Stomach/Bowel: Stomach is within normal limits. Appendix appears normal. No evidence of bowel wall thickening, distention, or  inflammatory changes. Vascular/Lymphatic: No significant vascular findings. Mildly enlarged right external iliac and inguinal lymph nodes appear slightly increased in size when compared to prior study, measuring up to 1.6 cm in short axis. Reproductive: The prostate is borderline enlarged, similar to prior study. Other: Recurrent supraumbilical hernia containing nondilated loops of small bowel and fat. There is a 2.8 x 3.9 x 4.9 cm fluid collection in the anterior abdominal wall at the site of recent incision. No pneumoperitoneum or free fluid. Musculoskeletal: No acute or significant osseous findings. IMPRESSION: 1. Previously seen 14 mm calculus in the midpole of the right kidney is now located at the right UPJ, resulting in moderate right hydronephrosis. Prominent right perinephric fat stranding could reflect pyelonephritis given clinical history, although evaluation is limited without intravenous contrast. 2. The previously seen 11 mm calculus in the proximal right ureter is now located in the distal right ureter. There is moderate right ureteral dilatation. 3. Recurrent supraumbilical hernia containing nondilated loops of small bowel. No evidence of bowel obstruction. 4. 2.8 x 3.9 x 4.9 cm fluid collection in the anterior abdominal wall at the site of recent incision. This may reflect a small seroma, however, an abscess could have a similar appearance. 5. Mildly enlarged right external iliac and inguinal lymph nodes appear slightly increased in size when compared to prior study, measuring up to 1.6 cm in short axis. These are nonspecific, and further  evaluation with follow-up CT in 3 months is recommended. 6. Hepatic steatosis, unchanged. Electronically Signed   By: Titus Dubin M.D.   On: 05/12/2017 18:24    Microbiology: Recent Results (from the past 240 hour(s))  Culture, blood (x 2)     Status: None (Preliminary result)   Collection Time: 05/12/17 10:49 PM  Result Value Ref Range Status    Specimen Description BLOOD LEFT HAND  Final   Special Requests   Final    BOTTLES DRAWN AEROBIC AND ANAEROBIC Blood Culture adequate volume   Culture NO GROWTH 1 DAY  Final   Report Status PENDING  Incomplete  Culture, blood (x 2)     Status: None (Preliminary result)   Collection Time: 05/12/17 10:49 PM  Result Value Ref Range Status   Specimen Description BLOOD RIGHT ANTECUBITAL  Final   Special Requests   Final    BOTTLES DRAWN AEROBIC AND ANAEROBIC Blood Culture adequate volume   Culture NO GROWTH 1 DAY  Final   Report Status PENDING  Incomplete  Urine culture     Status: None   Collection Time: 05/13/17  4:40 PM  Result Value Ref Range Status   Specimen Description URINE, CLEAN CATCH  Final   Special Requests NONE  Final   Culture NO GROWTH  Final   Report Status 05/14/2017 FINAL  Final     Labs: Basic Metabolic Panel: Recent Labs  Lab 05/12/17 1228 05/13/17 0321 05/14/17 0253  NA 134* 137 139  K 4.1 3.6 3.9  CL 102 104 106  CO2 22 23 24   GLUCOSE 146* 150* 141*  BUN 13 14 16   CREATININE 1.40* 1.35* 1.26*  CALCIUM 9.0 8.6* 8.6*   Liver Function Tests: Recent Labs  Lab 05/12/17 1228  AST 15  ALT 12*  ALKPHOS 84  BILITOT 0.7  PROT 7.5  ALBUMIN 2.8*   No results for input(s): LIPASE, AMYLASE in the last 168 hours. No results for input(s): AMMONIA in the last 168 hours. CBC: Recent Labs  Lab 05/12/17 1228 05/13/17 0321 05/14/17 0253  WBC 17.8* 14.1* 10.7*  NEUTROABS 15.3*  --  6.7  HGB 13.0 11.2* 10.3*  HCT 39.5 34.8* 32.7*  MCV 86.1 86.4 86.7  PLT 378 304 317   Cardiac Enzymes: No results for input(s): CKTOTAL, CKMB, CKMBINDEX, TROPONINI in the last 168 hours. BNP: BNP (last 3 results) No results for input(s): BNP in the last 8760 hours.  ProBNP (last 3 results) No results for input(s): PROBNP in the last 8760 hours.  CBG: Recent Labs  Lab 05/14/17 0818 05/14/17 1157 05/14/17 1645 05/14/17 2145 05/15/17 0743  GLUCAP 121* 103* 108* 112*  133*       Signed:  Nita Sells MD   [facility] 05/15/2017, 10:07 AM

## 2017-05-15 NOTE — Progress Notes (Signed)
Pt given discharge instructions, prescriptions, and care notes. Pt verbalized understanding AEB no further questions or concerns at this time. IV was discontinued, no redness, pain, or swelling noted at this time. Pt left the floor via wheelchair with staff in stable condition. 

## 2017-05-15 NOTE — Discharge Instructions (Signed)
Information on my medicine - ELIQUIS (apixaban)  This medication education was reviewed with me or my healthcare representative as part of my discharge preparation.     Why was Eliquis prescribed for you?--- You were already prescribed/taking Eliquis (apixaban) 5mg  twice daily prior to this hospital admission. Eliquis continued.   Eliquis was prescribed to treat blood clots that may have been found in the veins of your legs (deep vein thrombosis) or in your lungs (pulmonary embolism) and to reduce the risk of them occurring again.  What do You need to know about Eliquis ? Continue Eliquis (apixaba)  ONE 5 mg tablet taken TWICE daily.  Eliquis may be taken with or without food.   Try to take the dose about the same time in the morning and in the evening. If you have difficulty swallowing the tablet whole please discuss with your pharmacist how to take the medication safely.  Take Eliquis exactly as prescribed and DO NOT stop taking Eliquis without talking to the doctor who prescribed the medication.  Stopping may increase your risk of developing a new blood clot.  Refill your prescription before you run out.  After discharge, you should have regular check-up appointments with your healthcare provider that is prescribing your Eliquis.    What do you do if you miss a dose? If a dose of ELIQUIS is not taken at the scheduled time, take it as soon as possible on the same day and twice-daily administration should be resumed. The dose should not be doubled to make up for a missed dose.  Important Safety Information A possible side effect of Eliquis is bleeding. You should call your healthcare provider right away if you experience any of the following: ? Bleeding from an injury or your nose that does not stop. ? Unusual colored urine (red or dark brown) or unusual colored stools (red or black). ? Unusual bruising for unknown reasons. ? A serious fall or if you hit your head (even if there  is no bleeding).  Some medicines may interact with Eliquis and might increase your risk of bleeding or clotting while on Eliquis. To help avoid this, consult your healthcare provider or pharmacist prior to using any new prescription or non-prescription medications, including herbals, vitamins, non-steroidal anti-inflammatory drugs (NSAIDs) and supplements.  This website has more information on Eliquis (apixaban): http://www.eliquis.com/eliquis/home

## 2017-05-18 LAB — CULTURE, BLOOD (ROUTINE X 2)
Culture: NO GROWTH
Culture: NO GROWTH
Special Requests: ADEQUATE
Special Requests: ADEQUATE

## 2017-05-29 ENCOUNTER — Inpatient Hospital Stay: Payer: PRIVATE HEALTH INSURANCE | Admitting: Family Medicine

## 2017-06-10 ENCOUNTER — Inpatient Hospital Stay: Payer: PRIVATE HEALTH INSURANCE | Admitting: Family Medicine

## 2017-06-18 ENCOUNTER — Ambulatory Visit (INDEPENDENT_AMBULATORY_CARE_PROVIDER_SITE_OTHER): Payer: PRIVATE HEALTH INSURANCE | Admitting: Family Medicine

## 2017-06-18 ENCOUNTER — Encounter: Payer: Self-pay | Admitting: Family Medicine

## 2017-06-18 VITALS — BP 128/82 | HR 82 | Temp 98.4°F | Ht 74.0 in | Wt 329.8 lb

## 2017-06-18 DIAGNOSIS — Z1159 Encounter for screening for other viral diseases: Secondary | ICD-10-CM

## 2017-06-18 DIAGNOSIS — Z6841 Body Mass Index (BMI) 40.0 and over, adult: Secondary | ICD-10-CM | POA: Diagnosis not present

## 2017-06-18 DIAGNOSIS — Z8719 Personal history of other diseases of the digestive system: Secondary | ICD-10-CM | POA: Diagnosis not present

## 2017-06-18 DIAGNOSIS — E1165 Type 2 diabetes mellitus with hyperglycemia: Secondary | ICD-10-CM

## 2017-06-18 DIAGNOSIS — Z1211 Encounter for screening for malignant neoplasm of colon: Secondary | ICD-10-CM

## 2017-06-18 DIAGNOSIS — I2699 Other pulmonary embolism without acute cor pulmonale: Secondary | ICD-10-CM | POA: Diagnosis not present

## 2017-06-18 DIAGNOSIS — Z9889 Other specified postprocedural states: Secondary | ICD-10-CM | POA: Diagnosis not present

## 2017-06-18 LAB — CBC
HCT: 38.5 % — ABNORMAL LOW (ref 39.0–52.0)
Hemoglobin: 12.4 g/dL — ABNORMAL LOW (ref 13.0–17.0)
MCHC: 32.3 g/dL (ref 30.0–36.0)
MCV: 85.4 fl (ref 78.0–100.0)
Platelets: 257 10*3/uL (ref 150.0–400.0)
RBC: 4.51 Mil/uL (ref 4.22–5.81)
RDW: 16.2 % — ABNORMAL HIGH (ref 11.5–15.5)
WBC: 7.2 10*3/uL (ref 4.0–10.5)

## 2017-06-18 LAB — HEMOGLOBIN A1C: Hgb A1c MFr Bld: 6.7 % — ABNORMAL HIGH (ref 4.6–6.5)

## 2017-06-18 MED ORDER — TRIAMCINOLONE ACETONIDE 0.1 % EX CREA: 1.0000 "application " | TOPICAL_CREAM | Freq: Two times a day (BID) | CUTANEOUS | 0 refills | Status: DC

## 2017-06-18 NOTE — Assessment & Plan Note (Signed)
The patient reports peak weight of nearly 370.  He is always down to 329.  He is trying to eat better and exercise as able.  Patient is making good progress and I encouraged him to continue.

## 2017-06-18 NOTE — Assessment & Plan Note (Signed)
Remains on Eliquis 5 mg twice daily.  History of DVT and pulmonary embolism.  Follow-up with hematology for long-term planning

## 2017-06-18 NOTE — Assessment & Plan Note (Signed)
S: Unknown control at present. On metformin1 g BID- down to 500mg  twice a day- was having nausea, felt sluggish. He has not followed up for last 10 months for a1c recheck.CBGs- last few AM checks around 113 to 120 Exercise and diet-  Peak weight 370 at home- now down to 329.  Lab Results  Component Value Date   HGBA1C 7.2 (H) 07/25/2016   HGBA1C 10.2 04/16/2016   HGBA1C 7.4 (H) 09/07/2015   A/P: Update hemoglobin A1c today

## 2017-06-18 NOTE — Patient Instructions (Addendum)
Please stop by lab before you go  We will call you within a week or two about your referral to GI. If you do not hear within 3 weeks, give [place] a call.   Not sure what is causing there rash- lets try a cream for a week. Stop it if it makes the area worse and let me know  Great job with weight loss! Hopeful a1c is at goal under 7 and we can keep you on the lower dose metformin

## 2017-06-18 NOTE — Progress Notes (Signed)
Subjective:  Evan Leonard is a 57 y.o. year old very pleasant male patient who presents for/with See problem oriented charting ROS-no hypoglycemia.  Slight rash on abdomen.  No fever or chills.  Intermittent back pain but reports only mild since the ureteral stent was placed  Past Medical History-  Patient Active Problem List   Diagnosis Date Noted  . Diabetes mellitus with hyperglycemia ([place]) 03/31/2013    Priority: High  . Pulmonary embolism ([place]) 05/05/2012    Priority: High  . Morbid obesity with body mass index of 40.0-44.9 in adult [facility]) 05/05/2012    Priority: High  . DVT of left distal popliteal vein 05/05/2012    Priority: High  . S/P repair of recurrent ventral hernia 04/26/2017    Priority: Medium  . Chronic venous insufficiency 01/25/2015    Priority: Medium  . Obstructive sleep apnea 05/05/2012    Priority: Medium  . Hyponatremia 03/23/2013    Priority: Low  . Renal stone 05/12/2017    Medications- reviewed and updated Current Outpatient Medications  Medication Sig Dispense Refill  . ELIQUIS 5 MG TABS tablet TAKE 1 TABLET TWICE A DAY 60 tablet 5  . furosemide (LASIX) 40 MG tablet Take 1 tablet (40 mg total) by mouth daily. 30 tablet 5  . metFORMIN (GLUCOPHAGE) 500 MG tablet TAKE 2 TABLETS (1,000 MG TOTAL) BY MOUTH 2 (TWO) TIMES DAILY WITH A MEAL. 120 tablet 5  . Multiple Vitamin (MULTIVITAMIN WITH MINERALS) TABS tablet Take 1 tablet by mouth daily.     Marland Kitchen OVER THE COUNTER MEDICATION Apply 1 application topically daily. Medication: Bag Balm. Patient states he apply to his legs every day for itching.    . senna-docusate (SENOKOT S) 8.6-50 MG tablet Take 1 tablet by mouth at bedtime as needed for mild constipation. Also available OTC 30 tablet 0   No current facility-administered medications for this visit.     Objective: BP 128/82 (BP Location: Left Arm, Patient Position: Sitting, Cuff Size: Large)   Pulse 82   Temp 98.4 F (36.9 C) (Oral)   Ht 6\' 2"  (1.88 m)    Wt (!) 329 lb 12.8 oz (149.6 kg)   SpO2 97%   BMI 42.34 kg/m  Gen: NAD, resting comfortably CV: RRR no murmurs rubs or gallops Lungs: CTAB no crackles, wheeze, rhonchi Abdomen: soft/nontender/nondistended/normal bowel sounds.  Morbidly obese Well-healing midline scar from hernia repair.  To right of  right umbilicus, patient with 5 x 5 cm area of erythema with some scale and excoriation. Ext: Trace edema under compression stockings Skin: warm, dry  Diabetic Foot Exam - Simple   Simple Foot Form Diabetic Foot exam was performed with the following findings:  Yes 06/18/2017  1:07 PM  Visual Inspection No deformities, no ulcerations, no other skin breakdown bilaterally:  Yes Sensation Testing Intact to touch and monofilament testing bilaterally:  Yes Pulse Check Posterior Tibialis and Dorsalis pulse intact bilaterally:  Yes Comments Some thickening and yellowing of left great toenail    Assessment/Plan:  Health Maintenance Due  Topic Date Due  . Hepatitis C Screening - today 02-21-1961  . COLONOSCOPY -referral again today 05/06/2009  . URINE MICROALBUMIN - today (has had blood in urine so if high would get repeat later) 04/06/2014  . OPHTHALMOLOGY EXAM -get records from [facility] 04/26/2014  . HEMOGLOBIN A1C -today 01/25/2017  . FOOT EXAM -today 05/09/2017    Diabetes mellitus with hyperglycemia ([place]) S: Unknown control at present. On metformin1 g BID- down to 500mg  twice a  day- was having nausea, felt sluggish. He has not followed up for last 10 months for a1c recheck.CBGs- last few AM checks around 113 to 120 Exercise and diet-  Peak weight 370 at home- now down to 329.  Lab Results  Component Value Date   HGBA1C 7.2 (H) 07/25/2016   HGBA1C 10.2 04/16/2016   HGBA1C 7.4 (H) 09/07/2015   A/P: Update hemoglobin A1c today  Pulmonary embolism (HCC) Remains on Eliquis 5 mg twice daily.  History of DVT and pulmonary embolism.  Follow-up with hematology for long-term  planning  Morbid obesity with body mass index of 40.0-44.9 in adult [facility]) The patient reports peak weight of nearly 370.  He is always down to 329.  He is trying to eat better and exercise as able.  Patient is making good progress and I encouraged him to continue.    S/P repair of recurrent ventral hernia S:  Patient with hernia repair in early December for ventral hernia .   Then on 05/12/17 hospitalized for pyelonephritis with 8 mm proximal ureteric stone with hydronephrosis- had emergent ureteric stent placement on the right with cystoscopy, fossa navicularis striture dilation as well. had seroma during more recent hospitalization and was advised 10 day abdominal binder.  Dr. [name]- sees him soon- pateint has to call to schedule. Since surgery, 2-5 PM feels exhausted and then feels better.  He is compliant with his CPAP at night. Rash to right of ubilicus-started since surgery A/P: Patient healing well from the hernia surgery and is already back to work.  Rash that started sometime after surgery to right and we will treat with triamcinolone.  He still has a stent in place for his kidney stone and has follow-up with urology.  He is feeling well overall other than the afternoon fatigue-we will get CBC and CMP today as well.   Future Appointments  Date Time Provider [place]  12/22/2017 10:00 AM [name], MD [facility]   Return in about 6 months (around 12/16/2017) for physical.  Lab/Order associations: Type 2 diabetes mellitus with hyperglycemia, without long-term current use of insulin (HCC) - Plan: CBC, Comprehensive metabolic panel, Hemoglobin A1c, Microalbumin / creatinine urine ratio  Encounter for hepatitis C screening test for low risk patient - Plan: Hepatitis C antibody  Screen for colon cancer - Plan: Ambulatory referral to Gastroenterology  Other pulmonary embolism without acute cor pulmonale, unspecified chronicity ([place])  Meds ordered this encounter   Medications  . triamcinolone cream (KENALOG) 0.1 %    Sig: Apply 1 application topically 2 (two) times daily. For 7-10 days maximum    Dispense:  80 g    Refill:  0    Return precautions advised.  Garret Reddish, MD

## 2017-06-18 NOTE — Assessment & Plan Note (Addendum)
S:  Patient with hernia repair in early December for ventral hernia .   Then on 05/12/17 hospitalized for pyelonephritis with 8 mm proximal ureteric stone with hydronephrosis- had emergent ureteric stent placement on the right with cystoscopy, fossa navicularis striture dilation as well. had seroma during more recent hospitalization and was advised 10 day abdominal binder.  Dr. [name]- sees him soon- pateint has to call to schedule. Since surgery, 2-5 PM feels exhausted and then feels better.  He is compliant with his CPAP at night. Rash to right of ubilicus-started since surgery A/P: Patient healing well from the hernia surgery and is already back to work.  Rash that started sometime after surgery to right and we will treat with triamcinolone.  He still has a stent in place for his kidney stone and has follow-up with urology.  He is feeling well overall other than the afternoon fatigue-we will get CBC and CMP today as well.

## 2017-06-19 LAB — HEPATITIS C ANTIBODY
Hepatitis C Ab: NONREACTIVE
SIGNAL TO CUT-OFF: 0.01 (ref <1.00)

## 2017-06-19 LAB — MICROALBUMIN / CREATININE URINE RATIO
Creatinine,U: 79.5 mg/dL
Microalb Creat Ratio: 36.7 mg/g — ABNORMAL HIGH (ref 0.0–30.0)
Microalb, Ur: 29.2 mg/dL — ABNORMAL HIGH (ref 0.0–1.9)

## 2017-06-19 LAB — COMPREHENSIVE METABOLIC PANEL
ALT: 8 U/L (ref 0–53)
AST: 9 U/L (ref 0–37)
Albumin: 3.6 g/dL (ref 3.5–5.2)
Alkaline Phosphatase: 75 U/L (ref 39–117)
BUN: 14 mg/dL (ref 6–23)
CO2: 27 mEq/L (ref 19–32)
Calcium: 9 mg/dL (ref 8.4–10.5)
Chloride: 104 mEq/L (ref 96–112)
Creatinine, Ser: 0.96 mg/dL (ref 0.40–1.50)
GFR: 85.47 mL/min (ref >60.00)
Glucose, Bld: 135 mg/dL — ABNORMAL HIGH (ref 70–99)
Potassium: 4.5 mEq/L (ref 3.5–5.1)
Sodium: 138 mEq/L (ref 135–145)
Total Bilirubin: 0.4 mg/dL (ref 0.2–1.2)
Total Protein: 7.3 g/dL (ref 6.0–8.3)

## 2017-06-20 ENCOUNTER — Telehealth: Payer: Self-pay | Admitting: Family Medicine

## 2017-06-20 ENCOUNTER — Other Ambulatory Visit: Payer: Self-pay

## 2017-06-20 MED ORDER — LISINOPRIL 2.5 MG PO TABS: 2.5000 mg | ORAL_TABLET | Freq: Every day | ORAL | 3 refills | Status: DC

## 2017-06-20 NOTE — Telephone Encounter (Signed)
Left voicemail requesting call back.  

## 2017-06-20 NOTE — Telephone Encounter (Signed)
Will await info from urology

## 2017-06-20 NOTE — Telephone Encounter (Signed)
Copied from [place] [id number]. Topic: Quick Communication - See Telephone Encounter >> Jun 20, 2017 10:18 AM Synthia Innocent wrote: CRM for notification. See Telephone encounter for:  Dr [name] w/ [facility] calling needing surgical clearance for kidney stone 06/20/17.

## 2017-06-20 NOTE — Telephone Encounter (Signed)
[facility] and spoke to Burke. She states that typically a surgical clearance document is sent. She will send this message as high priority to Dr [name].

## 2017-06-20 NOTE — Telephone Encounter (Signed)
Does Dr. [name] consider this low or high risk for bleeding? I am assuming he wants [place] to stop the eliquis.   Also need to confirm patient that he can climb stairs or walk up steep incline without chest pain or significant shortness of breath.

## 2017-07-11 ENCOUNTER — Other Ambulatory Visit: Payer: Self-pay | Admitting: Urology

## 2017-07-17 ENCOUNTER — Other Ambulatory Visit: Payer: Self-pay

## 2017-07-17 MED ORDER — APIXABAN 5 MG PO TABS: 5.0000 mg | ORAL_TABLET | Freq: Two times a day (BID) | ORAL | 5 refills | Status: DC

## 2017-07-21 ENCOUNTER — Encounter: Payer: Self-pay | Admitting: Family Medicine

## 2017-07-23 NOTE — Patient Instructions (Addendum)
Joah Patlan  07/23/2017   Your procedure is scheduled on: Tuesday 07/29/2017  Report to [facility] Main  Entrance   Report to admitting at   0700 AM     Call this number if you have problems the morning of surgery [phone]             Please bring your CPAP mask and tubing with you to hospital morning of surgery!  How to Manage Your Diabetes Before and After Surgery  Why is it important to control my blood sugar before and after surgery? . Improving blood sugar levels before and after surgery helps healing and can limit problems. . A way of improving blood sugar control is eating a healthy diet by: o  Eating less sugar and carbohydrates o  Increasing activity/exercise o  Talking with your doctor about reaching your blood sugar goals . High blood sugars (greater than 180 mg/dL) can raise your risk of infections and slow your recovery, so you will need to focus on controlling your diabetes during the weeks before surgery. . Make sure that the doctor who takes care of your diabetes knows about your planned surgery including the date and location.  How do I manage my blood sugar before surgery? . Check your blood sugar at least 4 times a day, starting 2 days before surgery, to make sure that the level is not too high or low. o Check your blood sugar the morning of your surgery when you wake up and every 2 hours until you get to the Short Stay unit. . If your blood sugar is less than 70 mg/dL, you will need to treat for low blood sugar: o Do not take insulin. o Treat a low blood sugar (less than 70 mg/dL) with  cup of clear juice (cranberry or apple), 4 glucose tablets, OR glucose gel. o Recheck blood sugar in 15 minutes after treatment (to make sure it is greater than 70 mg/dL). If your blood sugar is not greater than 70 mg/dL on recheck, call [phone] for further instructions. . Report your blood sugar to the short stay nurse when you get to Short  Stay.  . If you are admitted to the hospital after surgery: o Your blood sugar will be checked by the staff and you will probably be given insulin after surgery (instead of oral diabetes medicines) to make sure you have good blood sugar levels. o The goal for blood sugar control after surgery is 80-180 mg/dL.   WHAT DO I DO ABOUT MY DIABETES MEDICATION?         Take your Metformin as usual the day before surgery!          . Do not take oral diabetes medicines (pills) the morning of surgery.       Remember: Do not eat food or drink liquids :After Midnight.     Take these medicines the morning of surgery with A SIP OF WATER: none   DO NOT TAKE ANY DIABETIC MEDICATIONS DAY OF YOUR SURGERY                               You may not have any metal on your body including hair pins and              piercings  Do not wear jewelry, make-up, lotions, powders or perfumes,  deodorant             Do not wear nail polish.  Do not shave  48 hours prior to surgery.              Men may shave face and neck.   Do not bring valuables to the hospital. [place].  Contacts, dentures or bridgework may not be worn into surgery.  Leave suitcase in the car. After surgery it may be brought to your room.     Patients discharged the day of surgery will not be allowed to drive home.  Name and phone number of your driver:  Special Instructions: N/A              Please read over the following fact sheets you were given: _____________________________________________________________________             [facility] - Preparing for Surgery Before surgery, you can play an important role.  Because skin is not sterile, your skin needs to be as free of germs as possible.  You can reduce the number of germs on your skin by washing with CHG (chlorahexidine gluconate) soap before surgery.  CHG is an antiseptic cleaner which kills germs and bonds with the skin to  continue killing germs even after washing. Please DO NOT use if you have an allergy to CHG or antibacterial soaps.  If your skin becomes reddened/irritated stop using the CHG and inform your nurse when you arrive at Short Stay. Do not shave (including legs and underarms) for at least 48 hours prior to the first CHG shower.  You may shave your face/neck. Please follow these instructions carefully:  1.  Shower with CHG Soap the night before surgery and the  morning of Surgery.  2.  If you choose to wash your hair, wash your hair first as usual with your  normal  shampoo.  3.  After you shampoo, rinse your hair and body thoroughly to remove the  shampoo.                           4.  Use CHG as you would any other liquid soap.  You can apply chg directly  to the skin and wash                       Gently with a scrungie or clean washcloth.  5.  Apply the CHG Soap to your body ONLY FROM THE NECK DOWN.   Do not use on face/ open                           Wound or open sores. Avoid contact with eyes, ears mouth and genitals (private parts).                       Wash face,  Genitals (private parts) with your normal soap.             6.  Wash thoroughly, paying special attention to the area where your surgery  will be performed.  7.  Thoroughly rinse your body with warm water from the neck down.  8.  DO NOT shower/wash with your normal soap after using and rinsing off  the CHG Soap.  9.  Pat yourself dry with a clean towel.            10.  Wear clean pajamas.            11.  Place clean sheets on your bed the night of your first shower and do not  sleep with pets. Day of Surgery : Do not apply any lotions/deodorants the morning of surgery.  Please wear clean clothes to the hospital/surgery center.  FAILURE TO FOLLOW THESE INSTRUCTIONS MAY RESULT IN THE CANCELLATION OF YOUR SURGERY PATIENT SIGNATURE_________________________________  NURSE  SIGNATURE__________________________________  ________________________________________________________________________

## 2017-07-24 ENCOUNTER — Encounter (HOSPITAL_COMMUNITY): Payer: Self-pay

## 2017-07-24 ENCOUNTER — Other Ambulatory Visit: Payer: Self-pay

## 2017-07-24 ENCOUNTER — Encounter (HOSPITAL_COMMUNITY)
Admission: RE | Admit: 2017-07-24 | Discharge: 2017-07-24 | Disposition: A | Discharge status: Home / Self Care | Payer: PRIVATE HEALTH INSURANCE | Source: Ambulatory Visit | Attending: Urology | Admitting: Urology

## 2017-07-24 DIAGNOSIS — N201 Calculus of ureter: Secondary | ICD-10-CM | POA: Diagnosis not present

## 2017-07-24 DIAGNOSIS — Z01810 Encounter for preprocedural cardiovascular examination: Secondary | ICD-10-CM | POA: Insufficient documentation

## 2017-07-24 DIAGNOSIS — Z01812 Encounter for preprocedural laboratory examination: Secondary | ICD-10-CM | POA: Diagnosis not present

## 2017-07-24 DIAGNOSIS — E119 Type 2 diabetes mellitus without complications: Secondary | ICD-10-CM | POA: Insufficient documentation

## 2017-07-24 HISTORY — DX: Peripheral vascular disease, unspecified: I73.9

## 2017-07-24 LAB — BASIC METABOLIC PANEL
Anion gap: 7 (ref 5–15)
BUN: 22 mg/dL — ABNORMAL HIGH (ref 6–20)
CO2: 24 mmol/L (ref 22–32)
Calcium: 9 mg/dL (ref 8.9–10.3)
Chloride: 106 mmol/L (ref 101–111)
Creatinine, Ser: 1.54 mg/dL — ABNORMAL HIGH (ref 0.61–1.24)
GFR calc Af Amer: 56 mL/min — ABNORMAL LOW (ref >60)
GFR calc non Af Amer: 48 mL/min — ABNORMAL LOW (ref >60)
Glucose, Bld: 118 mg/dL — ABNORMAL HIGH (ref 65–99)
Potassium: 4.6 mmol/L (ref 3.5–5.1)
Sodium: 137 mmol/L (ref 135–145)

## 2017-07-24 LAB — CBC
HCT: 33.7 % — ABNORMAL LOW (ref 39.0–52.0)
Hemoglobin: 10.6 g/dL — ABNORMAL LOW (ref 13.0–17.0)
MCH: 27.5 pg (ref 26.0–34.0)
MCHC: 31.5 g/dL (ref 30.0–36.0)
MCV: 87.5 fL (ref 78.0–100.0)
Platelets: 341 10*3/uL (ref 150–400)
RBC: 3.85 MIL/uL — ABNORMAL LOW (ref 4.22–5.81)
RDW: 15.5 % (ref 11.5–15.5)
WBC: 10 10*3/uL (ref 4.0–10.5)

## 2017-07-24 LAB — GLUCOSE, CAPILLARY: Glucose-Capillary: 111 mg/dL — ABNORMAL HIGH (ref 65–99)

## 2017-07-24 NOTE — Progress Notes (Signed)
Patient temperature orally 99.1 at pre-op appointment. When asked patient had he had any signs of cough, runny nose, he replied that he just had not felt good for the past 2 days with fatigue and  night sweats. Instructed patient to follow up with his Primary care physician. Patient verbalized understanding.

## 2017-07-28 MED ORDER — DEXTROSE 5 % IV SOLN
3.0000 g | INTRAVENOUS | Status: AC
  Administered 2017-07-29: 3 g via INTRAVENOUS
  Filled 2017-07-28: qty 3

## 2017-07-28 NOTE — H&P (Signed)
CC: New Patient - Acute Kidney Stone  HPI: Evan Leonard is a 57 year-old male established patient who is here for further eval and management of kidney stones.  Evaluated on 12/17 for right flank pain and fever for 2-3 days in the emergency department. He had an incarcerated ventral hernia treated on 12/1. At the time of that procedure a CT scan was obtained noting an 8 her right proximal ureteral pain with hydronephrosis. He proceeded with a ventral hernia repair. After developing the right flank pain and subjective fever, he presented to the emergency department with tachycardia and low-grade fever to do. He had an elevated white count at 17. Urinalysis was noted for positive nitrates and small amount of leukocytes. Further CT imaging indicated 2 stones in the right ureter with a calculi at the right UPJ and 11 mm stone in the distal right ureter. Given his presentation, he underwent emergent stenting on the right. 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 He is tentatively scheduled for right ureteroscopy and stent exchange on 3/5. He is here today for preoperative appointment. He's done well tolerating the stent with no significant right-sided pain or discomfort. He has had some increase in urgency with voiding but no problems getting his urine stream. Nocturia stable 0-1. He has had intermittent gross hematuria but no dysuria. Denies any fevers. He underwent umbilical hernia repair approximately 2 years ago and he states in the postoperative period passing a kidney stone without any significant issue. This was his first stone event. Denies a past urological history, denies history of kidney stones. Prior to recent hospitalization he denies any bothersome symptoms with voiding or past history of urinary tract infection.   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   The patient presents today for follow-up after being seen for renal colic  on [facility] at [facility]. The pain is on the right side. The patient relates initially having nausea, flank pain, and voiding symptoms. This is not his first kidney stone. He has had 1 stones prior to getting this one. He has not caught a stone in his urine strainer since his symptoms began.   He has had Ureteral Stent for treatment of his stones in the past.     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   Tobacco Use Assessment Completed: Used Tobacco in last 30 days? Has never drank.  Drinks 1 caffeinated drink per day. 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    REVIEW OF SYSTEMS:    GU Review Male:   Patient reports hard to postpone urination and get up at night to urinate. Patient denies frequent urination, burning/ pain with urination, leakage of urine, stream starts and stops, trouble starting your stream, have to strain to urinate , erection problems, and penile pain.  Gastrointestinal (Upper):   Patient reports nausea. Patient denies vomiting and indigestion/ heartburn.  Gastrointestinal (Lower):   Patient denies diarrhea and constipation.  Constitutional:   Patient reports fatigue and night sweats. Patient denies weight loss and fever.  Skin:   Patient denies skin rash/ lesion and itching.  Eyes:   Patient denies blurred vision and double vision.  Ears/ Nose/ Throat:   Patient denies sore throat and sinus problems.  Hematologic/Lymphatic:   Patient  denies swollen glands and easy bruising.  Cardiovascular:   Patient reports leg swelling. Patient denies chest pains.  Respiratory:   Patient denies cough and shortness of breath.  Endocrine:   Patient denies excessive thirst.  Musculoskeletal:   Patient reports back pain. Patient denies joint pain.  Neurological:   Patient denies headaches and dizziness.  Psychologic:   Patient denies depression and anxiety.   VITAL SIGNS:      07/24/2017 10:24 AM  Weight 328 lb / 148.78 kg  Pulse 83 /min  Temperature 98.6 F / 37 C   MULTI-SYSTEM PHYSICAL EXAMINATION:    Constitutional: Well-nourished. No physical deformities. Normally developed. Good grooming.  Neck: Neck symmetrical, not swollen. Normal tracheal position.  Respiratory: No labored breathing, no use of accessory muscles. Normal breath sounds.  Cardiovascular: Regular rate and rhythm. No murmur, no gallop. Normal temperature, normal extremity pulses, no swelling, no varicosities.  Skin: No paleness, no jaundice, no cyanosis. No lesion, no ulcer, no rash.  Neurologic / Psychiatric: Oriented to time, oriented to place, oriented to person. No depression, no anxiety, no agitation.  Gastrointestinal: Obese abdomen. Pfannenstiel scar. Well healed incision from ventral hernia repair. No mass, no tenderness, no rigidity. No CVAT.  Musculoskeletal: Normal gait and station of head and neck.     PAST DATA REVIEWED:  Source Of History:  Patient  Records Review:   Previous Hospital Records, Previous Patient Records  Urine Test Review:   Urinalysis, Urine Culture   07/24/17  Urinalysis  Urine Appearance Turbid   Urine Color Brown   Urine Glucose Neg   Urine Bilirubin Neg   Urine Ketones Trace   Urine Specific Gravity 1.025   Urine Blood 3+   Urine pH 5.5   Urine Protein 3+   Urine Urobilinogen 0.2   Urine Nitrites Neg   Urine Leukocyte Esterase 2+   Urine WBC/hpf 0 - 5/hpf   Urine RBC/hpf >60/hpf   Urine Epithelial Cells NS (Not Seen)    Urine Bacteria NS (Not Seen)   Urine Mucous Not Present   Urine Yeast NS (Not Seen)   Urine Trichomonas Not Present   Urine Cystals NS (Not Seen)   Urine Casts NS (Not Seen)   Urine Sperm Not Present    PROCEDURES:         KUB - [id number]  A single view of the abdomen is obtained.  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  Fecal Stasis:  Bowel gas and stool pattern appears within normal limits  Calculi:  Proximal right ureter calculi. 13 mm calculi adjacent to ureteral stent. I cannot identify any right renal or left renal calculi.   Ureteral Stent:  In position ureteral stent. Right ureteral stent.               Urinalysis w/Scope Dipstick Dipstick Cont'd Micro  Color: Brown Bilirubin: Neg WBC/hpf: 0 - 5/hpf  Appearance: Turbid Ketones: Trace RBC/hpf: >60/hpf  Specific Gravity: 1.025 Blood: 3+ Bacteria: NS (Not Seen)  pH: 5.5 Protein: 3+ Cystals: NS (Not Seen)  Glucose: Neg Urobilinogen: 0.2 Casts: NS (Not Seen)  Nitrites: Neg Trichomonas: Not Present    Leukocyte Esterase: 2+ Mucous: Not Present      Epithelial Cells: NS (Not Seen)      Yeast: NS (Not Seen)      Sperm: Not Present    Notes: MICROSCOPIC PERFORMED ON UNCONCENTRATED URINE    ASSESSMENT:      ICD-10 Details  1 GU:   Renal and ureteral calculus - N20.2 Right   PLAN:           Orders Labs Urine Culture          Schedule Return Visit/Planned Activity: Keep Scheduled Appointment - Schedule Surgery          Document Letter(s):  Created for Patient: Clinical Summary         Notes:   He's done well managing his stent and is eager to proceed with planned procedure next week. I'll send urine sample for culture. We briefly discussed the procedure itself. I answered all questions to the best of my ability with understanding expressed by the patient. Instructed to telephone the office if he has any additional questions or concerns about new symptoms. He understands to d/c his  blood thinner 3 days before procedure.        Next Appointment:      Next Appointment: 07/29/2017 09:00 AM    Appointment Type: Surgery     Location: [facility] [phone]    Provider: [name], M.D.    Reason for Visit: OP WL CYSTO STENT EXCHANGE URETEROSCOPY W/ LASER

## 2017-07-29 ENCOUNTER — Other Ambulatory Visit: Payer: Self-pay

## 2017-07-29 ENCOUNTER — Ambulatory Visit (HOSPITAL_COMMUNITY)
Admission: RE | Admit: 2017-07-29 | Discharge: 2017-07-29 | Disposition: A | Discharge status: Home / Self Care | Payer: PRIVATE HEALTH INSURANCE | Source: Ambulatory Visit | Attending: Urology | Admitting: Urology

## 2017-07-29 ENCOUNTER — Encounter (HOSPITAL_COMMUNITY): Payer: Self-pay | Admitting: *Deleted

## 2017-07-29 ENCOUNTER — Encounter (HOSPITAL_COMMUNITY)
Admission: RE | Disposition: A | Discharge status: Home / Self Care | Payer: Self-pay | Source: Ambulatory Visit | Attending: Urology

## 2017-07-29 ENCOUNTER — Ambulatory Visit (HOSPITAL_COMMUNITY): Payer: PRIVATE HEALTH INSURANCE | Admitting: Anesthesiology

## 2017-07-29 ENCOUNTER — Ambulatory Visit (HOSPITAL_COMMUNITY): Payer: PRIVATE HEALTH INSURANCE

## 2017-07-29 DIAGNOSIS — N132 Hydronephrosis with renal and ureteral calculous obstruction: Secondary | ICD-10-CM | POA: Insufficient documentation

## 2017-07-29 DIAGNOSIS — Z79899 Other long term (current) drug therapy: Secondary | ICD-10-CM | POA: Diagnosis not present

## 2017-07-29 DIAGNOSIS — Z7901 Long term (current) use of anticoagulants: Secondary | ICD-10-CM | POA: Diagnosis not present

## 2017-07-29 DIAGNOSIS — I739 Peripheral vascular disease, unspecified: Secondary | ICD-10-CM | POA: Insufficient documentation

## 2017-07-29 DIAGNOSIS — G4730 Sleep apnea, unspecified: Secondary | ICD-10-CM | POA: Diagnosis not present

## 2017-07-29 DIAGNOSIS — E119 Type 2 diabetes mellitus without complications: Secondary | ICD-10-CM | POA: Diagnosis not present

## 2017-07-29 DIAGNOSIS — Z86718 Personal history of other venous thrombosis and embolism: Secondary | ICD-10-CM | POA: Diagnosis not present

## 2017-07-29 DIAGNOSIS — R000 Tachycardia, unspecified: Secondary | ICD-10-CM | POA: Diagnosis not present

## 2017-07-29 DIAGNOSIS — N202 Calculus of kidney with calculus of ureter: Secondary | ICD-10-CM | POA: Diagnosis present

## 2017-07-29 DIAGNOSIS — Z6841 Body Mass Index (BMI) 40.0 and over, adult: Secondary | ICD-10-CM | POA: Insufficient documentation

## 2017-07-29 DIAGNOSIS — Z833 Family history of diabetes mellitus: Secondary | ICD-10-CM | POA: Diagnosis not present

## 2017-07-29 HISTORY — PX: CYSTOSCOPY/URETEROSCOPY/HOLMIUM LASER/STENT PLACEMENT: SHX6546

## 2017-07-29 LAB — GLUCOSE, CAPILLARY
Glucose-Capillary: 125 mg/dL — ABNORMAL HIGH (ref 65–99)
Glucose-Capillary: 126 mg/dL — ABNORMAL HIGH (ref 65–99)

## 2017-07-29 SURGERY — CYSTOSCOPY/URETEROSCOPY/HOLMIUM LASER/STENT PLACEMENT
Anesthesia: General | Laterality: Right

## 2017-07-29 MED ORDER — SODIUM CHLORIDE 0.9% FLUSH: 3.0000 mL | Freq: Two times a day (BID) | INTRAVENOUS | Status: DC

## 2017-07-29 MED ORDER — PROMETHAZINE HCL 25 MG/ML IJ SOLN: 6.2500 mg | INTRAMUSCULAR | Status: DC | PRN

## 2017-07-29 MED ORDER — SODIUM CHLORIDE 0.9 % IV SOLN: 250.0000 mL | INTRAVENOUS | Status: DC | PRN

## 2017-07-29 MED ORDER — DEXAMETHASONE SODIUM PHOSPHATE 10 MG/ML IJ SOLN
INTRAMUSCULAR | Status: DC | PRN
  Administered 2017-07-29: 8 mg via INTRAVENOUS

## 2017-07-29 MED ORDER — PROPOFOL 10 MG/ML IV BOLUS
INTRAVENOUS | Status: AC
  Filled 2017-07-29: qty 20

## 2017-07-29 MED ORDER — LIDOCAINE 2% (20 MG/ML) 5 ML SYRINGE
INTRAMUSCULAR | Status: DC | PRN
  Administered 2017-07-29: 100 mg via INTRAVENOUS

## 2017-07-29 MED ORDER — FENTANYL CITRATE (PF) 100 MCG/2ML IJ SOLN
INTRAMUSCULAR | Status: DC | PRN
  Administered 2017-07-29 (×2): 25 ug via INTRAVENOUS

## 2017-07-29 MED ORDER — FENTANYL CITRATE (PF) 100 MCG/2ML IJ SOLN
INTRAMUSCULAR | Status: AC
  Filled 2017-07-29: qty 2

## 2017-07-29 MED ORDER — SODIUM CHLORIDE 0.9 % IR SOLN
Status: DC | PRN
  Administered 2017-07-29: 1000 mL

## 2017-07-29 MED ORDER — PROPOFOL 10 MG/ML IV BOLUS
INTRAVENOUS | Status: DC | PRN
  Administered 2017-07-29: 250 mg via INTRAVENOUS

## 2017-07-29 MED ORDER — OXYCODONE HCL 5 MG PO TABS
5.0000 mg | ORAL_TABLET | ORAL | Status: DC | PRN
  Administered 2017-07-29: 10 mg via ORAL

## 2017-07-29 MED ORDER — MIDAZOLAM HCL 5 MG/5ML IJ SOLN
INTRAMUSCULAR | Status: DC | PRN
  Administered 2017-07-29: 2 mg via INTRAVENOUS

## 2017-07-29 MED ORDER — LACTATED RINGERS IV SOLN
INTRAVENOUS | Status: DC
  Administered 2017-07-29: 08:00:00 via INTRAVENOUS

## 2017-07-29 MED ORDER — FENTANYL CITRATE (PF) 100 MCG/2ML IJ SOLN: 25.0000 ug | INTRAMUSCULAR | Status: DC | PRN

## 2017-07-29 MED ORDER — ACETAMINOPHEN 325 MG PO TABS: 650.0000 mg | ORAL_TABLET | ORAL | Status: DC | PRN

## 2017-07-29 MED ORDER — SODIUM CHLORIDE 0.9 % IR SOLN
Status: DC | PRN
  Administered 2017-07-29: 6000 mL

## 2017-07-29 MED ORDER — OXYCODONE HCL 5 MG PO TABS
ORAL_TABLET | ORAL | Status: AC
  Filled 2017-07-29: qty 2

## 2017-07-29 MED ORDER — OXYCODONE HCL 5 MG PO TABS: 5.0000 mg | ORAL_TABLET | ORAL | 0 refills | Status: DC | PRN

## 2017-07-29 MED ORDER — ACETAMINOPHEN 650 MG RE SUPP
650.0000 mg | RECTAL | Status: DC | PRN
  Filled 2017-07-29: qty 1

## 2017-07-29 MED ORDER — ONDANSETRON HCL 4 MG/2ML IJ SOLN
INTRAMUSCULAR | Status: AC
  Filled 2017-07-29: qty 2

## 2017-07-29 MED ORDER — PHENYLEPHRINE 40 MCG/ML (10ML) SYRINGE FOR IV PUSH (FOR BLOOD PRESSURE SUPPORT)
PREFILLED_SYRINGE | INTRAVENOUS | Status: AC
  Filled 2017-07-29: qty 10

## 2017-07-29 MED ORDER — MORPHINE SULFATE (PF) 4 MG/ML IV SOLN: 2.0000 mg | INTRAVENOUS | Status: DC | PRN

## 2017-07-29 MED ORDER — MIDAZOLAM HCL 2 MG/2ML IJ SOLN
INTRAMUSCULAR | Status: AC
  Filled 2017-07-29: qty 2

## 2017-07-29 MED ORDER — ONDANSETRON HCL 4 MG/2ML IJ SOLN
INTRAMUSCULAR | Status: DC | PRN
  Administered 2017-07-29: 4 mg via INTRAVENOUS

## 2017-07-29 MED ORDER — PHENYLEPHRINE 40 MCG/ML (10ML) SYRINGE FOR IV PUSH (FOR BLOOD PRESSURE SUPPORT)
PREFILLED_SYRINGE | INTRAVENOUS | Status: DC | PRN
  Administered 2017-07-29 (×3): 80 ug via INTRAVENOUS

## 2017-07-29 MED ORDER — SODIUM CHLORIDE 0.9% FLUSH: 3.0000 mL | INTRAVENOUS | Status: DC | PRN

## 2017-07-29 SURGICAL SUPPLY — 26 items
BAG URO CATCHER STRL LF (MISCELLANEOUS) ×2 IMPLANT
BASKET DAKOTA 1.9FR 11X120 (BASKET) ×2 IMPLANT
BASKET STONE NCOMPASS (UROLOGICAL SUPPLIES) IMPLANT
CATH URET 5FR 28IN OPEN ENDED (CATHETERS) ×2 IMPLANT
CATH URET DUAL LUMEN 6-10FR 50 (CATHETERS) ×2 IMPLANT
CLOTH BEACON ORANGE TIMEOUT ST (SAFETY) ×2 IMPLANT
COVER FOOTSWITCH UNIV (MISCELLANEOUS) ×2 IMPLANT
COVER SURGICAL LIGHT HANDLE (MISCELLANEOUS) ×2 IMPLANT
EXTRACTOR STONE NITINOL NGAGE (UROLOGICAL SUPPLIES) ×2 IMPLANT
FIBER LASER FLEXIVA 1000 (UROLOGICAL SUPPLIES) IMPLANT
FIBER LASER FLEXIVA 365 (UROLOGICAL SUPPLIES) IMPLANT
FIBER LASER FLEXIVA 550 (UROLOGICAL SUPPLIES) IMPLANT
FIBER LASER TRAC TIP (UROLOGICAL SUPPLIES) ×2 IMPLANT
GLOVE SURG SS PI 8.0 STRL IVOR (GLOVE) IMPLANT
GOWN STRL REUS W/TWL XL LVL3 (GOWN DISPOSABLE) ×2 IMPLANT
GUIDEWIRE STR DUAL SENSOR (WIRE) ×2 IMPLANT
IV NS 1000ML (IV SOLUTION)
IV NS 1000ML BAXH (IV SOLUTION) IMPLANT
IV NS IRRIG 3000ML ARTHROMATIC (IV SOLUTION) IMPLANT
MANIFOLD NEPTUNE II (INSTRUMENTS) ×2 IMPLANT
PACK CYSTO (CUSTOM PROCEDURE TRAY) ×2 IMPLANT
SHEATH ACCESS URETERAL 54CM (SHEATH) ×2 IMPLANT
SHEATH URETERAL 12FRX35CM (MISCELLANEOUS) IMPLANT
STENT CONTOUR 6FRX26X.038 (STENTS) ×2 IMPLANT
TUBING CONNECTING 10 (TUBING) ×2 IMPLANT
TUBING UROLOGY SET (TUBING) ×2 IMPLANT

## 2017-07-29 NOTE — Anesthesia Procedure Notes (Signed)
Procedure Name: LMA Insertion Date/Time: 07/29/2017 9:15 AM Performed by: Victoriano Lain, CRNA Pre-anesthesia Checklist: Patient identified, Emergency Drugs available, Suction available, Patient being monitored and Timeout performed Patient Re-evaluated:Patient Re-evaluated prior to induction Oxygen Delivery Method: Circle system utilized Preoxygenation: Pre-oxygenation with 100% oxygen Induction Type: IV induction LMA: LMA with gastric port inserted LMA Size: 5.0 Number of attempts: 1 Placement Confirmation: positive ETCO2 and breath sounds checked- equal and bilateral Tube secured with: Tape Dental Injury: Teeth and Oropharynx as per pre-operative assessment

## 2017-07-29 NOTE — Op Note (Signed)
Procedure: 1.  Cystoscopy with removal of right double-J stent. 2.  Right ureteroscopy with holmium laser, stone extraction and insertion of right double-J stent.  Preop diagnosis: 13 mm right renal stone.  Postop diagnosis: Same.  Surgeon: Dr. [name].  Anesthesia: General.  Specimen: Stone fragments.  Drain: 6 [place] by 26 cm right contour double-J stent with tether.  EBL: Minimal.  Complications: None.  Indications: Evan Leonard was originally seen in December with a 13 mm obstructing stone on the right with associated infection.  A stent was placed.  He returns now for definitive stone management.  Procedure: He was started on oral antibiotics for staph epidermidis yesterday and was given 3 g of Ancef today.  He was taken operating room where general anesthetic was induced.  He was placed in lithotomy position and fitted with PAS hose.  His perineum and genitalia were prepped with Betadine solution and draped in usual sterile fashion.  Cystoscopy was performed using the 23 [place] scope and 30 degree lens.  Examination revealed a normal urethra.  The external sphincter was intact.  The prostatic urethra was short with mild obstruction.  Examination of bladder revealed mild trabeculation.  There were no mucosal lesions.  The left ureteral orifice was unremarkable.  There was a stent at the right ureteral orifice with mild encrustation and some peri-stent edema.  The stent was grasped with a grasping forceps and pulled to the urethral meatus.  An attempt was made to pass a sensor wire through the stent, but this was unsuccessful due to encrustation proximally.  The stent was removed and the cystoscope was reinserted.  The sensor guidewire was then passed directly to the kidney.  The cystoscope was then removed and a 55 cm digital access sheath was was inserted.  There was no resistance.  The inner core and guidewire were removed.  The dual-lumen digital flexible ureteroscope was then  passed to the kidney.  Inspection demonstrated the stone in the lower pole calyx and no other stones.  The stone was easily seen on fluoroscopy.  The stone was engaged with a 200 m tract tipped laser fiber with the laser set on 1 W and 10 Hz.  The stone was broken into manageable fragments and dust.  After the fragmentation was complete, a [place] basket was used to remove all significant fragments leaving only dust and grit.  The collecting system was thoroughly inspected to ensure that no significant fragments remained.  Final fluoroscopy revealed no retained fragments.  Ureteroscope was removed and a guidewire was reinserted to the kidney through the access sheath and the access sheath was removed.  A 6 French 26 cm contour double-J stent with tether was inserted over the wire to the kidney under fluoroscopic guidance.  The wire was removed leaving a good coil in the kidney and a good coil in the bladder.  The ureteroscope was then used to inspect the bladder to confirm the distal stent was in good position.  The ureteroscope was removed and the  stent string was secured to the patient's penis with tape.  He was taken down from lithotomy position, his anesthetic was reversed and he was moved to recovery in stable condition.  There were no complications.

## 2017-07-29 NOTE — Anesthesia Preprocedure Evaluation (Signed)
Anesthesia Evaluation  Patient identified by MRN, date of birth, ID band Patient awake    Reviewed: Allergy & Precautions, NPO status , Patient's Chart, lab work & pertinent test results  History of Anesthesia Complications Negative for: history of anesthetic complications  Airway Mallampati: III  TM Distance: >3 FB Neck ROM: Full    Dental  (+) Teeth Intact, Dental Advisory Given, Chipped,    Pulmonary sleep apnea ,    Pulmonary exam normal        Cardiovascular + Peripheral Vascular Disease  Normal cardiovascular exam     Neuro/Psych negative neurological ROS     GI/Hepatic negative GI ROS, Neg liver ROS,   Endo/Other  diabetes, Type 2, Oral Hypoglycemic AgentsMorbid obesity  Renal/GU Renal InsufficiencyRenal disease     Musculoskeletal negative musculoskeletal ROS (+)   Abdominal   Peds  Hematology  (+) anemia ,   Anesthesia Other Findings Day of surgery medications reviewed with the patient.  Reproductive/Obstetrics                             Lab Results  Component Value Date   WBC 10.0 07/24/2017   HGB 10.6 (L) 07/24/2017   HCT 33.7 (L) 07/24/2017   MCV 87.5 07/24/2017   PLT 341 07/24/2017   Lab Results  Component Value Date   CREATININE 1.54 (H) 07/24/2017   BUN 22 (H) 07/24/2017   NA 137 07/24/2017   K 4.6 07/24/2017   CL 106 07/24/2017   CO2 24 07/24/2017   Lab Results  Component Value Date   INR 1.15 06/16/2015   INR 1.17 12/14/2013   INR 1.20 05/07/2012   EKG: sinus tachycardia.  Anesthesia Physical  Anesthesia Plan  ASA: III  Anesthesia Plan: General   Post-op Pain Management:    Induction: Intravenous  PONV Risk Score and Plan: 3 and Ondansetron, Dexamethasone and Treatment may vary due to age or medical condition  Airway Management Planned: LMA  Additional Equipment:   Intra-op Plan:   Post-operative Plan: Extubation in OR  Informed  Consent: I have reviewed the patients History and Physical, chart, labs and discussed the procedure including the risks, benefits and alternatives for the proposed anesthesia with the patient or authorized representative who has indicated his/her understanding and acceptance.   Dental advisory given  Plan Discussed with: CRNA and Anesthesiologist  Anesthesia Plan Comments:         Anesthesia Quick Evaluation

## 2017-07-29 NOTE — Discharge Instructions (Signed)
Ureteral Stent Implantation, Care After Refer to this sheet in the next few weeks. These instructions provide you with information about caring for yourself after your procedure. Your health care provider may also give you more specific instructions. Your treatment has been planned according to current medical practices, but problems sometimes occur. Call your health care provider if you have any problems or questions after your procedure. What can I expect after the procedure? After the procedure, it is common to have:  Nausea.  Mild pain when you urinate. You may feel this pain in your lower back or lower abdomen. Pain should stop within a few minutes after you urinate. This may last for up to 1 week.  A small amount of blood in your urine for several days.  Follow these instructions at home:  Medicines  Take over-the-counter and prescription medicines only as told by your health care provider.  If you were prescribed an antibiotic medicine, take it as told by your health care provider. Do not stop taking the antibiotic even if you start to feel better.  Do not drive for 24 hours if you received a sedative.  Do not drive or operate heavy machinery while taking prescription pain medicines. Activity  Return to your normal activities as told by your health care provider. Ask your health care provider what activities are safe for you.  Do not lift anything that is heavier than 10 lb (4.5 kg). Follow this limit for 1 week after your procedure, or for as long as told by your health care provider. General instructions  Watch for any blood in your urine. Call your health care provider if the amount of blood in your urine increases.  If you have a catheter: ? Follow instructions from your health care provider about taking care of your catheter and collection bag. ? Do not take baths, swim, or use a hot tub until your health care provider approves.  Drink enough fluid to keep your urine  clear or pale yellow.  Keep all follow-up visits as told by your health care provider. This is important. Contact a health care provider if:  You have pain that gets worse or does not get better with medicine, especially pain when you urinate.  You have difficulty urinating.  You feel nauseous or you vomit repeatedly during a period of more than 2 days after the procedure. Get help right away if:  Your urine is dark red or has blood clots in it.  You are leaking urine (have incontinence).  The end of the stent comes out of your urethra.  You cannot urinate.  You have sudden, sharp, or severe pain in your abdomen or lower back.  You have a fever.  You may resume the Eliquis tomorrow.  You may remove the stent by pulling the attached string on Friday morning 08/01/17.  If you don't feel comfortable pulling this then call the office to come have it removed.    Continue the antibiotic you were sent on 07/28/17.  This information is not intended to replace advice given to you by your health care provider. Make sure you discuss any questions you have with your health care provider. Document Released: 01/13/2013 Document Revised: 10/19/2015 Document Reviewed: 11/25/2014 Elsevier Interactive Patient Education  [employer].

## 2017-07-29 NOTE — Interval H&P Note (Signed)
History and Physical Interval Note:  He was started on antibiotics on 3/4 for staph epi in the UCx.   07/29/2017 9:02 AM  [name]  has presented today for surgery, with the diagnosis of RIGHT URETERAL STONE  The various methods of treatment have been discussed with the patient and family. After consideration of risks, benefits and other options for treatment, the patient has consented to  Procedure(s): CYSTOSCOPY RIGHT URETEROSCOPY HOLMIUM LASER STENT EXCHANGE (Right) as a surgical intervention .  The patient's history has been reviewed, patient examined, no change in status, stable for surgery.  I have reviewed the patient's chart and labs.  Questions were answered to the patient's satisfaction.     Evan Leonard

## 2017-07-29 NOTE — Anesthesia Postprocedure Evaluation (Signed)
Anesthesia Post Note  Patient: [name]  Procedure(s) Performed: CYSTOSCOPY RIGHT URETEROSCOPY HOLMIUM LASER STENT EXCHANGE (Right )     Patient location during evaluation: PACU Anesthesia Type: General Level of consciousness: awake and alert Pain management: pain level controlled Vital Signs Assessment: post-procedure vital signs reviewed and stable Respiratory status: spontaneous breathing, nonlabored ventilation, respiratory function stable and patient connected to nasal cannula oxygen Cardiovascular status: blood pressure returned to baseline and stable Postop Assessment: no apparent nausea or vomiting Anesthetic complications: no    Last Vitals:  Vitals:   07/29/17 1115 07/29/17 1130  BP: 117/73 122/77  Pulse: 72   Resp: (!) 21   Temp:  37.2 C  SpO2: 96%     Last Pain:  Vitals:   07/29/17 0752  TempSrc: Oral                 Tiajuana Amass

## 2017-07-29 NOTE — Transfer of Care (Signed)
Immediate Anesthesia Transfer of Care Note  [name]  Procedure(s) Performed: CYSTOSCOPY RIGHT URETEROSCOPY HOLMIUM LASER STENT EXCHANGE (Right )  Patient Location: PACU  Anesthesia Type:General  Level of Consciousness: awake, alert , oriented and patient cooperative  Airway & Oxygen Therapy: Patient Spontanous Breathing and Patient connected to face mask oxygen  Post-op Assessment: Report given to RN, Post -op Vital signs reviewed and stable and Patient moving all extremities  Post vital signs: Reviewed and stable  Last Vitals:  Vitals:   07/29/17 0752 07/29/17 1048  BP: 135/81 (P) 120/81  Pulse: 93 (P) 80  Resp: 18   Temp: 36.9 C (P) 36.7 C  SpO2: 100% (P) 95%    Last Pain:  Vitals:   07/29/17 0752  TempSrc: Oral      Patients Stated Pain Goal: 3 (86/38/17 7116)  Complications: No apparent anesthesia complications

## 2017-08-26 ENCOUNTER — Other Ambulatory Visit: Payer: Self-pay

## 2017-08-26 MED ORDER — METFORMIN HCL 500 MG PO TABS: 1000.0000 mg | ORAL_TABLET | Freq: Two times a day (BID) | ORAL | 5 refills | Status: DC

## 2017-12-22 ENCOUNTER — Encounter: Payer: PRIVATE HEALTH INSURANCE | Admitting: Family Medicine

## 2018-01-05 ENCOUNTER — Other Ambulatory Visit: Payer: Self-pay

## 2018-01-05 ENCOUNTER — Ambulatory Visit (INDEPENDENT_AMBULATORY_CARE_PROVIDER_SITE_OTHER): Payer: PRIVATE HEALTH INSURANCE

## 2018-01-05 ENCOUNTER — Encounter (HOSPITAL_COMMUNITY): Payer: Self-pay | Admitting: Emergency Medicine

## 2018-01-05 ENCOUNTER — Ambulatory Visit (HOSPITAL_COMMUNITY)
Admission: EM | Admit: 2018-01-05 | Discharge: 2018-01-05 | Disposition: A | Discharge status: Home / Self Care | Payer: PRIVATE HEALTH INSURANCE | Attending: Physician Assistant | Admitting: Physician Assistant

## 2018-01-05 DIAGNOSIS — R0602 Shortness of breath: Secondary | ICD-10-CM

## 2018-01-05 NOTE — ED Triage Notes (Signed)
Pt reports SOB especially in the morning when he gets up and starts moving around.  He reports a "twing" in his right chest at times when he is SOB.    Pt has a history of blood clots and a PE.  Pt reports being light-headed this morning.  Pt does not report any URI symptoms.

## 2018-01-05 NOTE — Discharge Instructions (Signed)
See your Physician as scheduled for recheck.  Go to the Emergency department if symptoms worsen or change.

## 2018-01-05 NOTE — ED Provider Notes (Signed)
[facility]    CSN: [id number] Arrival date & time: 01/05/18  1423     History   Chief Complaint Chief Complaint  Patient presents with  . Shortness of Breath    HPI Evan Leonard is a 57 y.o. male.   The history is provided by the patient. No language interpreter was used.  Shortness of Breath  Severity:  Moderate Onset quality:  Gradual Duration:  2 weeks Timing:  Constant Progression:  Worsening Chronicity:  Recurrent Relieved by:  Nothing Worsened by:  Nothing Ineffective treatments:  None tried Associated symptoms: no abdominal pain, no fever, no hemoptysis, no sputum production and no wheezing   Risk factors: no recent alcohol use and no tobacco use   Pt reports he feels a twinge in his chest with exertion.   Pt reports he is currently pain free.  Pt reports some shortness of breath with exertion on and off.  Pt has had a PE in the past.  Pt is on eliquis.  He has not missed any medication.  Pt reports this does not feel like when he had a PE  Past Medical History:  Diagnosis Date  . Diabetes mellitus, type II ([place]) 02/2013  . DVT (deep venous thrombosis) ([place]) 05/04/2012   LLE  . Exertional dyspnea 05/04/2012   "isolated episode" (05/05/2012)  . Hepatic steatosis   . History of chickenpox   . Obesity   . OSA on CPAP   . Peripheral vascular disease (HCC)    varicose veins  . Pulmonary embolism ([place]) 05/04/2012   bilaterally  . Pyelonephritis 04/2017  . Small bowel obstruction ([place]) 09/06/2015  . Varicose veins     Patient Active Problem List   Diagnosis Date Noted  . Renal stone 05/12/2017  . S/P repair of recurrent ventral hernia 04/26/2017  . Chronic venous insufficiency 01/25/2015  . Diabetes mellitus with hyperglycemia ([place]) 03/31/2013  . Hyponatremia 03/23/2013  . Pulmonary embolism ([place]) 05/05/2012  . Morbid obesity with body mass index of 40.0-44.9 in adult [facility]) 05/05/2012  . Obstructive sleep apnea 05/05/2012  . DVT of left distal  popliteal vein 05/05/2012    Past Surgical History:  Procedure Laterality Date  . CYSTOSCOPY W/ URETERAL STENT PLACEMENT Right 05/12/2017   Procedure: CYSTOSCOPY WITH RETROGRADE PYELOGRAM/URETERAL STENT PLACEMENT;  Surgeon: [name], MD;  Location: [facility];  Service: Urology;  Laterality: Right;  . CYSTOSCOPY/URETEROSCOPY/HOLMIUM LASER/STENT PLACEMENT Right 07/29/2017   Procedure: CYSTOSCOPY RIGHT URETEROSCOPY HOLMIUM LASER STENT EXCHANGE;  Surgeon: [name], MD;  Location[facility];  Service: Urology;  Laterality: Right;  . HERNIA REPAIR     2015  . LAPAROTOMY N/A 12/13/2013   Procedure: EXPLORATORY LAPAROTOMY Repair ventral hernia, without mesh, partial omentectomy;  Surgeon: Gwenyth Ober, MD;  Location: [facility];  Service: General;  Laterality: N/A;  . VENTRAL HERNIA REPAIR N/A 04/26/2017   Procedure: INCARCERATED HERNIA REPAIR VENTRAL ADULT;  Surgeon: Georganna Skeans, MD;  Location: [facility];  Service: General;  Laterality: N/A;       Home Medications    Prior to Admission medications   Medication Sig Start Date End Date Taking? Authorizing Provider  apixaban (ELIQUIS) 5 MG TABS tablet Take 1 tablet (5 mg total) by mouth 2 (two) times daily. 07/17/17  Yes [name], MD  lisinopril (PRINIVIL,ZESTRIL) 2.5 MG tablet Take 1 tablet (2.5 mg total) by mouth daily. 06/20/17  Yes [name], MD  metFORMIN (GLUCOPHAGE) 500 MG tablet Take 2 tablets (1,000 mg total) by  mouth 2 (two) times daily with a meal. 08/26/17  Yes [name], MD  Multiple Vitamin (MULTIVITAMIN WITH MINERALS) TABS tablet Take 1 tablet by mouth daily.    Yes [provider]  acetaminophen (TYLENOL) 500 MG tablet Take 1,000 mg by mouth every 8 (eight) hours as needed for mild pain or moderate pain.    [provider]  furosemide (LASIX) 40 MG tablet Take 1 tablet (40 mg total) by mouth daily. Patient taking differently: Take 40 mg by mouth daily as needed for fluid or edema.  06/23/15   Laurey Morale, MD  OVER THE COUNTER MEDICATION Apply 1 application topically daily. Medication: Bag Balm. Patient states he apply to his legs every day for itching.    [provider]  oxyCODONE (ROXICODONE) 5 MG immediate release tablet Take 1 tablet (5 mg total) by mouth every 4 (four) hours as needed for severe pain. 07/29/17   [name], MD  triamcinolone cream (KENALOG) 0.1 % Apply 1 application topically 2 (two) times daily. For 7-10 days maximum Patient taking differently: Apply 1 application topically 2 (two) times daily as needed (rash). For 7-10 days maximum 06/18/17   [name], MD    Family History Family History  Problem Relation Age of Onset  . Diabetes Mother        died age 62- also bleeding ulcer  . Hypertension Mother   . Heart failure Father        in his 44s (patient was 36). day before surgery planned  . Other Maternal Grandmother        states natural causes all grandparents    Social History Social History   Tobacco Use  . Smoking status: Never Smoker  . Smokeless tobacco: Never Used  Substance Use Topics  . Alcohol use: No    Alcohol/week: 0.0 standard drinks  . Drug use: No     Allergies   Patient has no known allergies.   Review of Systems Review of Systems  Constitutional: Negative for fever.  Respiratory: Positive for shortness of breath. Negative for hemoptysis, sputum production and wheezing.   Gastrointestinal: Negative for abdominal pain.  All other systems reviewed and are negative.    Physical Exam Triage Vital Signs ED Triage Vitals [01/05/18 1433]  Enc Vitals Group     BP 123/81     Pulse Rate 80     Resp      Temp 98.6 F (37 C)     Temp Source Oral     SpO2 97 %     Weight      Height      Head Circumference      Peak Flow      Pain Score 0     Pain Loc      Pain Edu?      Excl. in [place]?    No data found.  Updated Vital Signs BP 123/81 (BP Location: Left Arm)   Pulse 80   Temp 98.6 F (37 C) (Oral)    SpO2 97%   Visual Acuity Right Eye Distance:   Left Eye Distance:   Bilateral Distance:    Right Eye Near:   Left Eye Near:    Bilateral Near:     Physical Exam  Constitutional: He is oriented to person, place, and time. He appears well-developed and well-nourished.  HENT:  Head: Normocephalic.  Eyes: EOM are normal.  Neck: Normal range of motion.  Cardiovascular: Normal rate, regular  rhythm and normal heart sounds.  Pulmonary/Chest: Effort normal and breath sounds normal. No respiratory distress.  Abdominal: Soft. He exhibits no distension.  Musculoskeletal: Normal range of motion.       Right lower leg: Normal.       Left lower leg: Normal.  Neurological: He is alert and oriented to person, place, and time.  Skin: Skin is warm.  Psychiatric: He has a normal mood and affect.  Nursing note and vitals reviewed.    UC Treatments / Results  Labs (all labs ordered are listed, but only abnormal results are displayed) Labs Reviewed - No data to display  EKG None  Radiology Dg Chest 2 View  Result Date: 01/05/2018 CLINICAL DATA:  Shortness of breath. EXAM: CHEST - 2 VIEW COMPARISON:  None. FINDINGS: The heart size and mediastinal contours are within normal limits. Both lungs are clear. No pneumothorax or pleural effusion is noted. The visualized skeletal structures are unremarkable. IMPRESSION: No active cardiopulmonary disease. Electronically Signed   By: Marijo Conception, M.D.   On: 01/05/2018 15:36    Procedures Procedures (including critical care time)  Medications Ordered in UC Medications - No data to display  Initial Impression / Assessment and Plan / UC Course  I have reviewed the triage vital signs and the nursing notes.  Pertinent labs & imaging results that were available during my care of the patient were reviewed by me and considered in my medical decision making (see chart for details).     EKg normal sinus 76, normal ekg pr is 164 qrs 90  No st  changes Chest xray is normal.    Pt counseled on results.  Pt is advised he needs further evaluation.  Pt advised if further shortness of breath, he should go to ED.  Pt is advised he may need to have a stress test.    Final Clinical Impressions(s) / UC Diagnoses   Final diagnoses:  SOB (shortness of breath)     Discharge Instructions     See your Physician as scheduled for recheck.  Go to the Emergency department if symptoms worsen or change.    ED Prescriptions    None     Controlled Substance Prescriptions [place] Controlled Substance Registry consulted? Not Applicable  An After Visit Summary was printed and given to the patient.    Fransico Meadow, [place] 01/05/18 1812

## 2018-02-02 ENCOUNTER — Ambulatory Visit (HOSPITAL_COMMUNITY)
Admission: EM | Admit: 2018-02-02 | Discharge: 2018-02-02 | Disposition: A | Discharge status: Home / Self Care | Payer: PRIVATE HEALTH INSURANCE | Attending: Urgent Care | Admitting: Urgent Care

## 2018-02-02 ENCOUNTER — Encounter (HOSPITAL_COMMUNITY): Payer: Self-pay

## 2018-02-02 ENCOUNTER — Ambulatory Visit (INDEPENDENT_AMBULATORY_CARE_PROVIDER_SITE_OTHER): Payer: PRIVATE HEALTH INSURANCE

## 2018-02-02 ENCOUNTER — Other Ambulatory Visit: Payer: Self-pay

## 2018-02-02 DIAGNOSIS — L03115 Cellulitis of right lower limb: Secondary | ICD-10-CM | POA: Diagnosis not present

## 2018-02-02 DIAGNOSIS — I739 Peripheral vascular disease, unspecified: Secondary | ICD-10-CM

## 2018-02-02 DIAGNOSIS — Z6841 Body Mass Index (BMI) 40.0 and over, adult: Secondary | ICD-10-CM | POA: Insufficient documentation

## 2018-02-02 DIAGNOSIS — Z86711 Personal history of pulmonary embolism: Secondary | ICD-10-CM

## 2018-02-02 DIAGNOSIS — E1151 Type 2 diabetes mellitus with diabetic peripheral angiopathy without gangrene: Secondary | ICD-10-CM | POA: Insufficient documentation

## 2018-02-02 DIAGNOSIS — Z86718 Personal history of other venous thrombosis and embolism: Secondary | ICD-10-CM

## 2018-02-02 DIAGNOSIS — Z7984 Long term (current) use of oral hypoglycemic drugs: Secondary | ICD-10-CM | POA: Diagnosis not present

## 2018-02-02 DIAGNOSIS — Z79899 Other long term (current) drug therapy: Secondary | ICD-10-CM | POA: Insufficient documentation

## 2018-02-02 DIAGNOSIS — E669 Obesity, unspecified: Secondary | ICD-10-CM | POA: Insufficient documentation

## 2018-02-02 DIAGNOSIS — G4733 Obstructive sleep apnea (adult) (pediatric): Secondary | ICD-10-CM | POA: Diagnosis not present

## 2018-02-02 DIAGNOSIS — Z7901 Long term (current) use of anticoagulants: Secondary | ICD-10-CM | POA: Insufficient documentation

## 2018-02-02 DIAGNOSIS — M79661 Pain in right lower leg: Secondary | ICD-10-CM

## 2018-02-02 DIAGNOSIS — R2241 Localized swelling, mass and lump, right lower limb: Secondary | ICD-10-CM | POA: Diagnosis not present

## 2018-02-02 DIAGNOSIS — M7989 Other specified soft tissue disorders: Secondary | ICD-10-CM

## 2018-02-02 LAB — CBC WITH DIFFERENTIAL/PLATELET
Abs Immature Granulocytes: 0.1 10*3/uL (ref 0.0–0.1)
Basophils Absolute: 0.1 10*3/uL (ref 0.0–0.1)
Basophils Relative: 1 %
Eosinophils Absolute: 0.5 10*3/uL (ref 0.0–0.7)
Eosinophils Relative: 6 %
HCT: 42.9 % (ref 39.0–52.0)
Hemoglobin: 13.4 g/dL (ref 13.0–17.0)
Immature Granulocytes: 1 %
Lymphocytes Relative: 29 %
Lymphs Abs: 2.5 10*3/uL (ref 0.7–4.0)
MCH: 27.3 pg (ref 26.0–34.0)
MCHC: 31.2 g/dL (ref 30.0–36.0)
MCV: 87.4 fL (ref 78.0–100.0)
Monocytes Absolute: 0.7 10*3/uL (ref 0.1–1.0)
Monocytes Relative: 8 %
Neutro Abs: 4.8 10*3/uL (ref 1.7–7.7)
Neutrophils Relative %: 55 %
Platelets: 260 10*3/uL (ref 150–400)
RBC: 4.91 MIL/uL (ref 4.22–5.81)
RDW: 15.6 % — ABNORMAL HIGH (ref 11.5–15.5)
WBC: 8.7 10*3/uL (ref 4.0–10.5)

## 2018-02-02 MED ORDER — CEPHALEXIN 500 MG PO CAPS: 500.0000 mg | ORAL_CAPSULE | Freq: Three times a day (TID) | ORAL | 0 refills | Status: DC

## 2018-02-02 NOTE — ED Triage Notes (Signed)
Pt states he may have an infection in right leg swelling and painful. X 4 days

## 2018-02-02 NOTE — ED Provider Notes (Signed)
MRN: [id number] DOB: 06/14/1960  Subjective:   Evan Leonard is a 57 y.o. male presenting for 4 day history of right lower leg swelling, intermittent sharp pain. Has a history of DVT of left popliteal vein, chest PE. He is currently on Eliquis for the past 2 years. Has also taken Xarelto in the past, aspirin.  Denies fever, chest pain, shortness of breath, heart racing.  Has a history of peripheral vascular disease but does not have a vascular surgeon/vascular specialist.  No current facility-administered medications for this encounter.   Current Outpatient Medications:  .  acetaminophen (TYLENOL) 500 MG tablet, Take 1,000 mg by mouth every 8 (eight) hours as needed for mild pain or moderate pain., Disp: , Rfl:  .  apixaban (ELIQUIS) 5 MG TABS tablet, Take 1 tablet (5 mg total) by mouth 2 (two) times daily., Disp: 60 tablet, Rfl: 5 .  furosemide (LASIX) 40 MG tablet, Take 1 tablet (40 mg total) by mouth daily. (Patient taking differently: Take 40 mg by mouth daily as needed for fluid or edema. ), Disp: 30 tablet, Rfl: 5 .  lisinopril (PRINIVIL,ZESTRIL) 2.5 MG tablet, Take 1 tablet (2.5 mg total) by mouth daily., Disp: 90 tablet, Rfl: 3 .  metFORMIN (GLUCOPHAGE) 500 MG tablet, Take 2 tablets (1,000 mg total) by mouth 2 (two) times daily with a meal., Disp: 120 tablet, Rfl: 5 .  Multiple Vitamin (MULTIVITAMIN WITH MINERALS) TABS tablet, Take 1 tablet by mouth daily. , Disp: , Rfl:  .  OVER THE COUNTER MEDICATION, Apply 1 application topically daily. Medication: Bag Balm. Patient states he apply to his legs every day for itching., Disp: , Rfl:  .  oxyCODONE (ROXICODONE) 5 MG immediate release tablet, Take 1 tablet (5 mg total) by mouth every 4 (four) hours as needed for severe pain., Disp: 15 tablet, Rfl: 0 .  triamcinolone cream (KENALOG) 0.1 %, Apply 1 application topically 2 (two) times daily. For 7-10 days maximum (Patient taking differently: Apply 1 application topically 2 (two) times daily as  needed (rash). For 7-10 days maximum), Disp: 80 g, Rfl: 0   No Known Allergies  Past Medical History:  Diagnosis Date  . Diabetes mellitus, type II ([place]) 02/2013  . DVT (deep venous thrombosis) ([place]) 05/04/2012   LLE  . Exertional dyspnea 05/04/2012   "isolated episode" (05/05/2012)  . Hepatic steatosis   . History of chickenpox   . Obesity   . OSA on CPAP   . Peripheral vascular disease (HCC)    varicose veins  . Pulmonary embolism ([place]) 05/04/2012   bilaterally  . Pyelonephritis 04/2017  . Small bowel obstruction ([place]) 09/06/2015  . Varicose veins      Past Surgical History:  Procedure Laterality Date  . CYSTOSCOPY W/ URETERAL STENT PLACEMENT Right 05/12/2017   Procedure: CYSTOSCOPY WITH RETROGRADE PYELOGRAM/URETERAL STENT PLACEMENT;  Surgeon: [name], MD;  Location: [facility];  Service: Urology;  Laterality: Right;  . CYSTOSCOPY/URETEROSCOPY/HOLMIUM LASER/STENT PLACEMENT Right 07/29/2017   Procedure: CYSTOSCOPY RIGHT URETEROSCOPY HOLMIUM LASER STENT EXCHANGE;  Surgeon: [name], MD;  [facility];  Service: Urology;  Laterality: Right;  . HERNIA REPAIR     2015  . LAPAROTOMY N/A 12/13/2013   Procedure: EXPLORATORY LAPAROTOMY Repair ventral hernia, without mesh, partial omentectomy;  Surgeon: Gwenyth Ober, MD;  Location: [facility];  Service: General;  Laterality: N/A;  . VENTRAL HERNIA REPAIR N/A 04/26/2017   Procedure: INCARCERATED HERNIA REPAIR VENTRAL ADULT;  Surgeon: Georganna Skeans, MD;  Location: [facility];  Service:  General;  Laterality: N/A;    Objective:   Vitals: BP (!) 147/73   Pulse 81   Temp 98.3 F (36.8 C)   Resp 20   Wt (!) 357 lb (161.9 kg)   SpO2 98%   BMI 45.84 kg/m   Physical Exam  Constitutional: He is oriented to person, place, and time. He appears well-developed and well-nourished.  Cardiovascular: Normal rate.  Pulmonary/Chest: Effort normal.  Musculoskeletal:       Right lower leg: He exhibits tenderness (mild-moderate) and swelling.        Legs: Neurological: He is alert and oriented to person, place, and time.   Dg Ankle Complete Right  Result Date: 02/02/2018 CLINICAL DATA:  Right lower leg cellulitis. EXAM: RIGHT ANKLE - COMPLETE 3+ VIEW COMPARISON:  None. FINDINGS: Diffuse soft tissue swelling is identified compatible with cellulitis. No underlying acute bone abnormality. No focal bone erosions identified. Plantar heel spur noted. IMPRESSION: 1. No focal bone erosions to suggest osteomyelitis. 2. Diffuse soft tissue swelling. Electronically Signed   By: Kerby Moors M.D.   On: 02/02/2018 17:21   Results for orders placed or performed during the hospital encounter of 02/02/18 (from the past 24 hour(s))  CBC with Differential     Status: Abnormal   Collection Time: 02/02/18  5:03 PM  Result Value Ref Range   WBC 8.7 4.0 - 10.5 K/uL   RBC 4.91 4.22 - 5.81 MIL/uL   Hemoglobin 13.4 13.0 - 17.0 g/dL   HCT 42.9 39.0 - 52.0 %   MCV 87.4 78.0 - 100.0 fL   MCH 27.3 26.0 - 34.0 pg   MCHC 31.2 30.0 - 36.0 g/dL   RDW 15.6 (H) 11.5 - 15.5 %   Platelets 260 150 - 400 K/uL   Neutrophils Relative % 55 %   Neutro Abs 4.8 1.7 - 7.7 K/uL   Lymphocytes Relative 29 %   Lymphs Abs 2.5 0.7 - 4.0 K/uL   Monocytes Relative 8 %   Monocytes Absolute 0.7 0.1 - 1.0 K/uL   Eosinophils Relative 6 %   Eosinophils Absolute 0.5 0.0 - 0.7 K/uL   Basophils Relative 1 %   Basophils Absolute 0.1 0.0 - 0.1 K/uL   Immature Granulocytes 1 %   Abs Immature Granulocytes 0.1 0.0 - 0.1 K/uL    Assessment and Plan :   Cellulitis of right lower extremity  Pain and swelling of right lower leg  History of DVT (deep vein thrombosis)  History of pulmonary embolism  Peripheral vascular disease (HCC)  We will start Keflex 3 times daily to address cellulitis.  Patient is to come back in 2 days for recheck.  ER return to clinic precautions reviewed.  CBC is very reassuring.   Jaynee Eagles, PA-C 02/02/18 1725

## 2018-02-09 ENCOUNTER — Ambulatory Visit (INDEPENDENT_AMBULATORY_CARE_PROVIDER_SITE_OTHER): Payer: PRIVATE HEALTH INSURANCE | Admitting: Family Medicine

## 2018-02-09 ENCOUNTER — Ambulatory Visit: Payer: PRIVATE HEALTH INSURANCE | Admitting: Family Medicine

## 2018-02-09 ENCOUNTER — Encounter: Payer: Self-pay | Admitting: Family Medicine

## 2018-02-09 VITALS — BP 128/72 | HR 88 | Temp 98.5°F | Ht 74.0 in | Wt 355.4 lb

## 2018-02-09 DIAGNOSIS — Z6841 Body Mass Index (BMI) 40.0 and over, adult: Secondary | ICD-10-CM

## 2018-02-09 DIAGNOSIS — E1165 Type 2 diabetes mellitus with hyperglycemia: Secondary | ICD-10-CM | POA: Diagnosis not present

## 2018-02-09 DIAGNOSIS — R5383 Other fatigue: Secondary | ICD-10-CM | POA: Diagnosis not present

## 2018-02-09 DIAGNOSIS — Z23 Encounter for immunization: Secondary | ICD-10-CM

## 2018-02-09 DIAGNOSIS — L03115 Cellulitis of right lower limb: Secondary | ICD-10-CM | POA: Diagnosis not present

## 2018-02-09 DIAGNOSIS — R0789 Other chest pain: Secondary | ICD-10-CM | POA: Diagnosis not present

## 2018-02-09 DIAGNOSIS — I82432 Acute embolism and thrombosis of left popliteal vein: Secondary | ICD-10-CM

## 2018-02-09 LAB — COMPREHENSIVE METABOLIC PANEL
ALT: 16 U/L (ref 0–53)
AST: 16 U/L (ref 0–37)
Albumin: 3.9 g/dL (ref 3.5–5.2)
Alkaline Phosphatase: 63 U/L (ref 39–117)
BUN: 24 mg/dL — ABNORMAL HIGH (ref 6–23)
CO2: 29 mEq/L (ref 19–32)
Calcium: 9.6 mg/dL (ref 8.4–10.5)
Chloride: 105 mEq/L (ref 96–112)
Creatinine, Ser: 1.09 mg/dL (ref 0.40–1.50)
GFR: 73.65 mL/min (ref >60.00)
Glucose, Bld: 107 mg/dL — ABNORMAL HIGH (ref 70–99)
Potassium: 4.1 mEq/L (ref 3.5–5.1)
Sodium: 140 mEq/L (ref 135–145)
Total Bilirubin: 0.3 mg/dL (ref 0.2–1.2)
Total Protein: 7.9 g/dL (ref 6.0–8.3)

## 2018-02-09 LAB — CBC
HCT: 40 % (ref 39.0–52.0)
Hemoglobin: 13.3 g/dL (ref 13.0–17.0)
MCHC: 33.2 g/dL (ref 30.0–36.0)
MCV: 83.6 fl (ref 78.0–100.0)
Platelets: 250 10*3/uL (ref 150.0–400.0)
RBC: 4.78 Mil/uL (ref 4.22–5.81)
RDW: 16.1 % — ABNORMAL HIGH (ref 11.5–15.5)
WBC: 10 10*3/uL (ref 4.0–10.5)

## 2018-02-09 LAB — TSH: TSH: 0.69 u[IU]/mL (ref 0.35–4.50)

## 2018-02-09 LAB — POCT GLYCOSYLATED HEMOGLOBIN (HGB A1C): Hemoglobin A1C: 6.4 % — AB (ref 4.0–5.6)

## 2018-02-09 MED ORDER — FUROSEMIDE 40 MG PO TABS: 40.0000 mg | ORAL_TABLET | Freq: Every day | ORAL | 2 refills | Status: DC | PRN

## 2018-02-09 NOTE — Patient Instructions (Addendum)
Health Maintenance Due  Topic Date Due  . COLONOSCOPY - referred last visit but hasnt followed through. Encouraged him to call #- please insert 05/06/2009  . OPHTHALMOLOGY EXAM - team please get records from [facility] 04/26/2014  . HEMOGLOBIN A1C -Please stop by lab before you go 12/16/2017  . INFLUENZA VACCINE - team please offer this 12/25/2017   . Team Please offer- Shingrix #1 today. Repeat injection in 2-3 months. Schedule this before you leave.       Depression screen [facility] 2/9 06/18/2017 08/05/2013  Decreased Interest 0 0  Down, Depressed, Hopeless 0 0  PHQ - 2 Score 0 0

## 2018-02-09 NOTE — Progress Notes (Signed)
Subjective:  Stepan Verrette is a 57 y.o. year old very pleasant male patient who presents for/with See problem oriented charting ROS- no fever or chills. Has had some shortness of breath at times. Also brief chest pain at times. Edema right leg increased from prior   Past Medical History-  Patient Active Problem List   Diagnosis Date Noted  . Atypical chest pain 02/09/2018    Priority: High  . Diabetes mellitus with hyperglycemia ([place]) 03/31/2013    Priority: High  . Pulmonary embolism ([place]) 05/05/2012    Priority: High  . Morbid obesity with body mass index of 40.0-44.9 in adult [facility]) 05/05/2012    Priority: High  . DVT of left distal popliteal vein 05/05/2012    Priority: High  . S/P repair of recurrent ventral hernia 04/26/2017    Priority: Medium  . Chronic venous insufficiency 01/25/2015    Priority: Medium  . Obstructive sleep apnea 05/05/2012    Priority: Medium  . Hyponatremia 03/23/2013    Priority: Low  . Renal stone 05/12/2017    Medications- reviewed and updated Current Outpatient Medications  Medication Sig Dispense Refill  . acetaminophen (TYLENOL) 500 MG tablet Take 1,000 mg by mouth every 8 (eight) hours as needed for mild pain or moderate pain.    Marland Kitchen apixaban (ELIQUIS) 5 MG TABS tablet Take 1 tablet (5 mg total) by mouth 2 (two) times daily. 60 tablet 5  . cephALEXin (KEFLEX) 500 MG capsule Take 1 capsule (500 mg total) by mouth 3 (three) times daily. 30 capsule 0  . furosemide (LASIX) 40 MG tablet Take 1 tablet (40 mg total) by mouth daily as needed for fluid or edema. 30 tablet 2  . lisinopril (PRINIVIL,ZESTRIL) 2.5 MG tablet Take 1 tablet (2.5 mg total) by mouth daily. 90 tablet 3  . metFORMIN (GLUCOPHAGE) 500 MG tablet Take 2 tablets (1,000 mg total) by mouth 2 (two) times daily with a meal. 120 tablet 5  . Multiple Vitamin (MULTIVITAMIN WITH MINERALS) TABS tablet Take 1 tablet by mouth daily.     Marland Kitchen OVER THE COUNTER MEDICATION Apply 1 application topically  daily. Medication: Bag Balm. Patient states he apply to his legs every day for itching.    Marland Kitchen oxyCODONE (ROXICODONE) 5 MG immediate release tablet Take 1 tablet (5 mg total) by mouth every 4 (four) hours as needed for severe pain. 15 tablet 0  . triamcinolone cream (KENALOG) 0.1 % Apply 1 application topically 2 (two) times daily. For 7-10 days maximum (Patient taking differently: Apply 1 application topically 2 (two) times daily as needed (rash). For 7-10 days maximum) 80 g 0   No current facility-administered medications for this visit.     Objective: BP 128/72 (BP Location: Left Arm, Patient Position: Sitting, Cuff Size: Large)   Pulse 88   Temp 98.5 F (36.9 C) (Oral)   Ht 6\' 2"  (1.88 m)   Wt (!) 355 lb 6.4 oz (161.2 kg)   SpO2 96%   BMI 45.63 kg/m  Gen: NAD, resting comfortably CV: RRR no murmurs rubs or gallops Lungs: CTAB no crackles, wheeze, rhonchi Abdomen: soft/nontender/distended from obesity Ext: 2+ nonpitting edema on right leg, 1+ on left under compression stockings Skin: warm, dry, warmth and erythema noted in area seen in photograph below on lower right leg. appears to have healing excoriation on shin. Minimal tenderness. Band aids removed and no active weeping noted.      Assessment/Plan:  Other notes: 1. Feeling down for a few months. No deaths  or emergencies. Not feeling like going to work  Like he used to. Doesn't feel sad or bad about himself. No SI. We discussed counseling and he would like to pursue that and follow up with me in October to consider meds if not improving or sooner if worsening 2. Discussed importance of weight loss with current symptoms and medical issues- he is working to lose and will continue  Right leg edema/cellulitis follow up (though cellulitis new to [place]) S:  Patient complains of right leg swelling with long term history of right leg swelling even though DVTs were in left (History of DVT on left in 2013 and again in 2017. Also  history of PE- on eliquis long term). He reports seeing vascular within 5 years he thinks and being told not much that can be done for the edema/swelling.   Over a week ago area got really read, tender, warm- saw urgent care and has improved on keflex for several days (10 day course). Area of redness has receeded significantly but still some redness and swelling and warmth. Tenderness much better. Getting some weeping of fluid with known venous insufficiency- wearing compression stockings  May have had small cut on anterior shin as point of entry A/P: Cellulitis improving - finish 3 more days of keflex.  - we are going to try lasix 40mg  for 3-4 days to see if we can aid in reduction in swelling while he continues antibiotic. I want him to see me back later this week if redness worsens or doesn't continue to improve. Has used lasix in past for worsening edema and helpful.  - hold off on repeat vascular evaluation  Diabetes mellitus with hyperglycemia (HCC) S:  controlled on metformin 500mg  BID CBGs- rare lightheadedness after metformin  (encouraged him to check CBGs) Exercise and diet- poor habits but trying to improve in recent weeks Lab Results  Component Value Date   HGBA1C 6.4 (A) 02/09/2018   HGBA1C 6.7 (H) 06/18/2017   HGBA1C 7.2 (H) 07/25/2016   A/P: continue current rx, at least q6 month follow up advisable    Atypical chest pain S: chest pain once a month - slight shooting pain then goes away. Feels like cant get a good deep breath in the morning when gets up but doesn't go along with chest pain. No exertional chest pain but does get winded with exercise. Weight has gone up recently and issues have been worse with weight increase.  A/P:  Pain does not sound cardiac but Patient with diabetes, age, morbid obesity, sedentary activity as risk factors- will refer to cardiology for their opinion. Also with worsening shortness of breath with weight gain- Suspect shortness of breath would  improve with weight loss - but cant rule out cardiac cause. With fatigue- will get labs to make sure no obvious cause there of SOB and fatigue   Future Appointments  Date Time Provider [place]  03/30/2018  2:15 PM [name], MD [facility]   Lab/Order associations: Need for prophylactic vaccination and inoculation against influenza - Plan: Flu Vaccine QUAD 36+ mos IM  Cellulitis of right leg  Atypical chest pain - Plan: Ambulatory referral to Cardiology  Type 2 diabetes mellitus with hyperglycemia, without long-term current use of insulin ([place]) - Plan: POCT glycosylated hemoglobin (Hb A1C)  Deep vein thrombosis (DVT) of popliteal vein of left lower extremity, unspecified chronicity (HCC)  Fatigue, unspecified type - Plan: CBC, Comprehensive metabolic panel, TSH  Meds ordered this encounter  Medications  .  furosemide (LASIX) 40 MG tablet    Sig: Take 1 tablet (40 mg total) by mouth daily as needed for fluid or edema.    Dispense:  30 tablet    Refill:  2    Return precautions advised.  Garret Reddish, MD

## 2018-02-09 NOTE — Patient Instructions (Addendum)
Health Maintenance Due  Topic Date Due  . COLONOSCOPY - Roselyn Reef please get him # to be able to call   05/06/2009  . OPHTHALMOLOGY EXAM -please call and schedule 04/26/2014  . HEMOGLOBIN A1C -completed today 12/16/2017  . INFLUENZA VACCINE -completed today 12/25/2017   we are going to try lasix 40mg  for 3-4 days to see if we can aid in reduction in swelling while he continues antibiotic. I want him to see me back later this week if redness worsens or doesn't continue to improve.   Finish keflex full 10 days  Please stop by lab before you go- just the 3 labs I ordered right now cbc, cmp, tsh. Can do other labs at physical time.   See [place] back if worsening feelings in regards to your mood- consider counseling  We will call you within two weeks about your referral to cardiology. If you do not hear within 3 weeks, give [place] a call.

## 2018-02-09 NOTE — Assessment & Plan Note (Signed)
S: chest pain once a month - slight shooting pain then goes away. Feels like cant get a good deep breath in the morning when gets up but doesn't go along with chest pain. No exertional chest pain but does get winded with exercise. Weight has gone up recently and issues have been worse with weight increase.  A/P:  Pain does not sound cardiac but Patient with diabetes, age, morbid obesity, sedentary activity as risk factors- will refer to cardiology for their opinion. Also with worsening shortness of breath with weight gain- Suspect shortness of breath would improve with weight loss - but cant rule out cardiac cause. With fatigue- will get labs to make sure no obvious cause there of SOB and fatigue

## 2018-02-09 NOTE — Assessment & Plan Note (Signed)
S:  controlled on metformin 500mg  BID CBGs- rare lightheadedness after metformin  (encouraged him to check CBGs) Exercise and diet- poor habits but trying to improve in recent weeks Lab Results  Component Value Date   HGBA1C 6.4 (A) 02/09/2018   HGBA1C 6.7 (H) 06/18/2017   HGBA1C 7.2 (H) 07/25/2016   A/P: continue current rx, at least q6 month follow up advisable

## 2018-02-09 NOTE — Progress Notes (Deleted)
Phone: [phone]  Subjective:  Patient presents today for their annual physical. Chief complaint-noted.   See problem oriented charting- ROS- full  review of systems was completed and negative except for: ***  The following were reviewed and entered/updated in epic: Past Medical History:  Diagnosis Date  . Diabetes mellitus, type II ([place]) 02/2013  . DVT (deep venous thrombosis) ([place]) 05/04/2012   LLE  . Exertional dyspnea 05/04/2012   "isolated episode" (05/05/2012)  . Hepatic steatosis   . History of chickenpox   . Obesity   . OSA on CPAP   . Peripheral vascular disease (HCC)    varicose veins  . Pulmonary embolism ([place]) 05/04/2012   bilaterally  . Pyelonephritis 04/2017  . Small bowel obstruction ([place]) 09/06/2015  . Varicose veins    Patient Active Problem List   Diagnosis Date Noted  . Diabetes mellitus with hyperglycemia ([place]) 03/31/2013    Priority: High  . Pulmonary embolism ([place]) 05/05/2012    Priority: High  . Morbid obesity with body mass index of 40.0-44.9 in adult [facility]) 05/05/2012    Priority: High  . DVT of left distal popliteal vein 05/05/2012    Priority: High  . S/P repair of recurrent ventral hernia 04/26/2017    Priority: Medium  . Chronic venous insufficiency 01/25/2015    Priority: Medium  . Obstructive sleep apnea 05/05/2012    Priority: Medium  . Hyponatremia 03/23/2013    Priority: Low  . Renal stone 05/12/2017   Past Surgical History:  Procedure Laterality Date  . CYSTOSCOPY W/ URETERAL STENT PLACEMENT Right 05/12/2017   Procedure: CYSTOSCOPY WITH RETROGRADE PYELOGRAM/URETERAL STENT PLACEMENT;  Surgeon: [name], MD;  Location: [facility];  Service: Urology;  Laterality: Right;  . CYSTOSCOPY/URETEROSCOPY/HOLMIUM LASER/STENT PLACEMENT Right 07/29/2017   Procedure: CYSTOSCOPY RIGHT URETEROSCOPY HOLMIUM LASER STENT EXCHANGE;  Surgeon: [name], MD;  Location: [facility];  Service: Urology;  Laterality: Right;  . HERNIA REPAIR     2015  .  LAPAROTOMY N/A 12/13/2013   Procedure: EXPLORATORY LAPAROTOMY Repair ventral hernia, without mesh, partial omentectomy;  Surgeon: Gwenyth Ober, MD;  Location: [facility];  Service: General;  Laterality: N/A;  . VENTRAL HERNIA REPAIR N/A 04/26/2017   Procedure: INCARCERATED HERNIA REPAIR VENTRAL ADULT;  Surgeon: Georganna Skeans, MD;  Location: [facility];  Service: General;  Laterality: N/A;    Family History  Problem Relation Age of Onset  . Diabetes Mother        died age 25- also bleeding ulcer  . Hypertension Mother   . Heart failure Father        in his 74s (patient was 20). day before surgery planned  . Other Maternal Grandmother        states natural causes all grandparents    Medications- reviewed and updated Current Outpatient Medications  Medication Sig Dispense Refill  . acetaminophen (TYLENOL) 500 MG tablet Take 1,000 mg by mouth every 8 (eight) hours as needed for mild pain or moderate pain.    Marland Kitchen apixaban (ELIQUIS) 5 MG TABS tablet Take 1 tablet (5 mg total) by mouth 2 (two) times daily. 60 tablet 5  . cephALEXin (KEFLEX) 500 MG capsule Take 1 capsule (500 mg total) by mouth 3 (three) times daily. 30 capsule 0  . furosemide (LASIX) 40 MG tablet Take 1 tablet (40 mg total) by mouth daily. (Patient taking differently: Take 40 mg by mouth daily as needed for fluid or edema. ) 30 tablet 5  . lisinopril (PRINIVIL,ZESTRIL) 2.5 MG tablet Take  1 tablet (2.5 mg total) by mouth daily. 90 tablet 3  . metFORMIN (GLUCOPHAGE) 500 MG tablet Take 2 tablets (1,000 mg total) by mouth 2 (two) times daily with a meal. 120 tablet 5  . Multiple Vitamin (MULTIVITAMIN WITH MINERALS) TABS tablet Take 1 tablet by mouth daily.     Marland Kitchen OVER THE COUNTER MEDICATION Apply 1 application topically daily. Medication: Bag Balm. Patient states he apply to his legs every day for itching.    Marland Kitchen oxyCODONE (ROXICODONE) 5 MG immediate release tablet Take 1 tablet (5 mg total) by mouth every 4 (four) hours as needed for severe  pain. 15 tablet 0  . triamcinolone cream (KENALOG) 0.1 % Apply 1 application topically 2 (two) times daily. For 7-10 days maximum (Patient taking differently: Apply 1 application topically 2 (two) times daily as needed (rash). For 7-10 days maximum) 80 g 0   No current facility-administered medications for this visit.     Allergies-reviewed and updated No Known Allergies  Social History   Social History Narrative   Single. Lives alone. Friends that live close. No pets.       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       Hobbies: golf, basketball, walking   Tired from work right now    Objective: There were no vitals taken for this visit. Gen: NAD, resting comfortably HEENT: Mucous membranes are moist. Oropharynx normal Neck: no thyromegaly CV: RRR no murmurs rubs or gallops Lungs: CTAB no crackles, wheeze, rhonchi Abdomen: soft/nontender/nondistended/normal bowel sounds. No rebound or guarding.  Ext: no edema Skin: warm, dry Neuro: grossly normal, moves all extremities, PERRLA ***  Assessment/Plan:  57 y.o. male presenting for annual physical.  Health Maintenance counseling: 1. Anticipatory guidance: Patient counseled regarding regular dental exams ***q6 months, eye exams ***yearly, wearing seatbelts.  2. Risk factor reduction:  Advised patient of need for regular exercise and diet rich and fruits and vegetables to reduce risk of heart attack and stroke. Exercise- ***. Diet-***. Peak weight 370.  Wt Readings from Last 3 Encounters:  02/02/18 (!) 357 lb (161.9 kg)  07/29/17 (!) 328 lb (148.8 kg)  07/24/17 (!) 328 lb 6.4 oz (149 kg)  3. Immunizations/screenings/ancillary studies- also offered shingrix *** Immunization History  Administered Date(s) Administered  . Influenza Split 05/27/2012  . Influenza,inj,Quad PF,6+ Mos 02/15/2013, 03/01/2014, 06/19/2015, 04/16/2016, 04/29/2017  . Pneumococcal Polysaccharide-23 03/31/2013, 06/19/2015  . Tdap 05/09/2016    Health Maintenance Due  Topic Date Due  . COLONOSCOPY - referred last visit but hasnt followed through. Encouraged him to call # 05/06/2009  . OPHTHALMOLOGY EXAM - team please get records from [place] 04/26/2014  . HEMOGLOBIN A1C -team please order with labs for diabetes along with cbc, cmp, lipid panel 12/16/2017  . INFLUENZA VACCINE -  12/25/2017  4. Prostate cancer screening- ***  Advised PSA per usptf recommendations from 41-9 years old. Rectal exam *** 5. Colon cancer screening - see above 6. Skin cancer screening- ***advised regular sunscreen use. Denies worrisome, changing, or new skin lesions.  7. 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 On metformin 500mg  BID- sluggish and and nausea on higher doses. Is working on weight loss Lab Results  Component Value Date   HGBA1C 6.7 (H) 06/18/2017   PE and history of dvt- eliquis 5mg  BID lifelong planned as seems has had 2 DVTs.  Hematology visit  for long term planning mentioned in last note but I dont see that he has seen them.   Morbid obesity- see lifestyle discussions above  Groin rash after surgery- ***  SOB seen in urgent care 01/05/18- he was advised to go to ED to consider stress test- I dont see that he went.   A week ago seen in ED for cellulitis and treated in ED with TID dosing No problem-specific Assessment & Plan notes found for this encounter.   Future Appointments  Date Time Provider [place]  02/09/2018  8:30 AM [name], MD [facility]   No follow-ups on file.  Lab/Order associations: No diagnosis found.  No orders of the defined types were placed in this encounter.   Return precautions advised.  Garret Reddish, MD

## 2018-03-05 ENCOUNTER — Ambulatory Visit (HOSPITAL_COMMUNITY)
Admission: EM | Admit: 2018-03-05 | Discharge: 2018-03-05 | Disposition: A | Discharge status: Home / Self Care | Payer: PRIVATE HEALTH INSURANCE | Attending: Family Medicine | Admitting: Family Medicine

## 2018-03-05 ENCOUNTER — Other Ambulatory Visit: Payer: Self-pay

## 2018-03-05 ENCOUNTER — Encounter (HOSPITAL_COMMUNITY): Payer: Self-pay | Admitting: Emergency Medicine

## 2018-03-05 DIAGNOSIS — Z7901 Long term (current) use of anticoagulants: Secondary | ICD-10-CM | POA: Insufficient documentation

## 2018-03-05 DIAGNOSIS — R110 Nausea: Secondary | ICD-10-CM | POA: Diagnosis not present

## 2018-03-05 DIAGNOSIS — R3915 Urgency of urination: Secondary | ICD-10-CM

## 2018-03-05 DIAGNOSIS — Z79891 Long term (current) use of opiate analgesic: Secondary | ICD-10-CM | POA: Insufficient documentation

## 2018-03-05 DIAGNOSIS — Z7984 Long term (current) use of oral hypoglycemic drugs: Secondary | ICD-10-CM | POA: Insufficient documentation

## 2018-03-05 DIAGNOSIS — N390 Urinary tract infection, site not specified: Secondary | ICD-10-CM | POA: Insufficient documentation

## 2018-03-05 DIAGNOSIS — Z79899 Other long term (current) drug therapy: Secondary | ICD-10-CM | POA: Diagnosis not present

## 2018-03-05 DIAGNOSIS — R531 Weakness: Secondary | ICD-10-CM | POA: Diagnosis not present

## 2018-03-05 LAB — POCT I-STAT, CHEM 8
BUN: 22 mg/dL — ABNORMAL HIGH (ref 6–20)
Calcium, Ion: 1.17 mmol/L (ref 1.15–1.40)
Chloride: 104 mmol/L (ref 98–111)
Creatinine, Ser: 1.2 mg/dL (ref 0.61–1.24)
Glucose, Bld: 131 mg/dL — ABNORMAL HIGH (ref 70–99)
HCT: 42 % (ref 39.0–52.0)
Hemoglobin: 14.3 g/dL (ref 13.0–17.0)
Potassium: 4.4 mmol/L (ref 3.5–5.1)
Sodium: 138 mmol/L (ref 135–145)
TCO2: 24 mmol/L (ref 22–32)

## 2018-03-05 LAB — POCT URINALYSIS DIP (DEVICE)
Bilirubin Urine: NEGATIVE
Glucose, UA: NEGATIVE mg/dL
Hgb urine dipstick: NEGATIVE
Ketones, ur: NEGATIVE mg/dL
Nitrite: POSITIVE — AB
Protein, ur: 30 mg/dL — AB
Specific Gravity, Urine: 1.025 (ref 1.005–1.030)
Urobilinogen, UA: 0.2 mg/dL (ref 0.0–1.0)
pH: 5.5 (ref 5.0–8.0)

## 2018-03-05 MED ORDER — CIPROFLOXACIN HCL 500 MG PO TABS: 500.0000 mg | ORAL_TABLET | Freq: Two times a day (BID) | ORAL | 0 refills | Status: DC

## 2018-03-05 NOTE — ED Triage Notes (Signed)
Pt reports low urine output, nausea, chills and weakness x2 days.

## 2018-03-05 NOTE — ED Provider Notes (Addendum)
[place]    CSN: [id number] Arrival date & time: 03/05/18  1401     History   Chief Complaint Chief Complaint  Patient presents with  . low urine output  . Nausea  . Weakness    HPI Evan Leonard is a 57 y.o. male.   Pt reports low urine output, nausea, chills and weakness x 2 days.  Patient states that he has urgency and slight dysuria.  The nausea is very mild and has had no vomiting.  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     Past Medical History:  Diagnosis Date  . Diabetes mellitus, type II ([place]) 02/2013  . DVT (deep venous thrombosis) ([place]) 05/04/2012   LLE  . Exertional dyspnea 05/04/2012   "isolated episode" (05/05/2012)  . Hepatic steatosis   . History of chickenpox   . Obesity   . OSA on CPAP   . Peripheral vascular disease (HCC)    varicose veins  . Pulmonary embolism ([place]) 05/04/2012   bilaterally  . Pyelonephritis 04/2017  . Small bowel obstruction ([place]) 09/06/2015  . Varicose veins     Patient Active Problem List   Diagnosis Date Noted  . Atypical chest pain 02/09/2018  . Renal stone 05/12/2017  . S/P repair of recurrent ventral hernia 04/26/2017  . Chronic venous insufficiency 01/25/2015  . Diabetes mellitus with hyperglycemia ([place]) 03/31/2013  . Hyponatremia 03/23/2013  . Pulmonary embolism ([place]) 05/05/2012  . Morbid obesity with body mass index of 40.0-44.9 in adult [facility]) 05/05/2012  . Obstructive sleep apnea 05/05/2012  . DVT of left distal popliteal vein 05/05/2012    Past Surgical History:  Procedure Laterality Date  . CYSTOSCOPY W/ URETERAL STENT PLACEMENT Right 05/12/2017   Procedure: CYSTOSCOPY WITH RETROGRADE PYELOGRAM/URETERAL STENT PLACEMENT;  Surgeon: [name], MD;  Location: [place];  Service: Urology;  Laterality: Right;  . CYSTOSCOPY/URETEROSCOPY/HOLMIUM LASER/STENT PLACEMENT Right 07/29/2017   Procedure: CYSTOSCOPY RIGHT URETEROSCOPY HOLMIUM LASER STENT EXCHANGE;  Surgeon: [name], MD;  [facility];   Service: Urology;  Laterality: Right;  . HERNIA REPAIR     2015  . LAPAROTOMY N/A 12/13/2013   Procedure: EXPLORATORY LAPAROTOMY Repair ventral hernia, without mesh, partial omentectomy;  Surgeon: Gwenyth Ober, MD;  Location: [place];  Service: General;  Laterality: N/A;  . VENTRAL HERNIA REPAIR N/A 04/26/2017   Procedure: INCARCERATED HERNIA REPAIR VENTRAL ADULT;  Surgeon: Georganna Skeans, MD;  Location: [place];  Service: General;  Laterality: N/A;       Home Medications    Prior to Admission medications   Medication Sig Start Date End Date Taking? Authorizing Provider  apixaban (ELIQUIS) 5 MG TABS tablet Take 1 tablet (5 mg total) by mouth 2 (two) times daily. 07/17/17  Yes [name], MD  furosemide (LASIX) 40 MG tablet Take 1 tablet (40 mg total) by mouth daily as needed for fluid or edema. 02/09/18  Yes [name], MD  lisinopril (PRINIVIL,ZESTRIL) 2.5 MG tablet Take 1 tablet (2.5 mg total) by mouth daily. 06/20/17  Yes [name], MD  metFORMIN (GLUCOPHAGE) 500 MG tablet Take 2 tablets (1,000 mg total) by mouth 2 (two) times daily with a meal. 08/26/17  Yes [name], MD  acetaminophen (TYLENOL) 500 MG tablet Take 1,000 mg by mouth every 8 (eight) hours as needed for mild pain or moderate pain.    [provider]  cephALEXin (KEFLEX) 500 MG capsule Take 1 capsule (500 mg total) by mouth 3 (three) times daily.  02/02/18   Jaynee Eagles, PA-C  ciprofloxacin (CIPRO) 500 MG tablet Take 1 tablet (500 mg total) by mouth 2 (two) times daily. 03/05/18   Robyn Haber, MD  Multiple Vitamin (MULTIVITAMIN WITH MINERALS) TABS tablet Take 1 tablet by mouth daily.     [provider]  OVER THE COUNTER MEDICATION Apply 1 application topically daily. Medication: Bag Balm. Patient states he apply to his legs every day for itching.    [provider]  oxyCODONE (ROXICODONE) 5 MG immediate release tablet Take 1 tablet (5 mg total) by mouth every 4 (four) hours  as needed for severe pain. 07/29/17   [name], MD  triamcinolone cream (KENALOG) 0.1 % Apply 1 application topically 2 (two) times daily. For 7-10 days maximum Patient taking differently: Apply 1 application topically 2 (two) times daily as needed (rash). For 7-10 days maximum 06/18/17   [name], MD    Family History Family History  Problem Relation Age of Onset  . Diabetes Mother        died age 68- also bleeding ulcer  . Hypertension Mother   . Heart failure Father        in his 61s (patient was 89). day before surgery planned  . Other Maternal Grandmother        states natural causes all grandparents    Social History Social History   Tobacco Use  . Smoking status: Never Smoker  . Smokeless tobacco: Never Used  Substance Use Topics  . Alcohol use: No    Alcohol/week: 0.0 standard drinks  . Drug use: No     Allergies   Patient has no known allergies.   Review of Systems Review of Systems  Constitutional: Positive for chills.  HENT: Negative.   Eyes: Negative.   Respiratory: Negative.   Cardiovascular: Negative.   Gastrointestinal: Positive for nausea. Negative for vomiting.  Genitourinary: Positive for dysuria and urgency.  Neurological: Negative.      Physical Exam Triage Vital Signs ED Triage Vitals  Enc Vitals Group     BP 03/05/18 1506 135/68     Pulse Rate 03/05/18 1506 91     Resp --      Temp 03/05/18 1506 99.2 F (37.3 C)     Temp Source 03/05/18 1506 Oral     SpO2 03/05/18 1506 99 %     Weight --      Height --      Head Circumference --      Peak Flow --      Pain Score 03/05/18 1507 0     Pain Loc --      Pain Edu? --      Excl. in [place]? --    No data found.  Updated Vital Signs BP 135/68 (BP Location: Left Arm)   Pulse 91   Temp 99.2 F (37.3 C) (Oral)   SpO2 99%    Physical Exam  Constitutional: He is oriented to person, place, and time. He appears well-developed and well-nourished.  HENT:  Right Ear: External ear  normal.  Left Ear: External ear normal.  Mouth/Throat: Oropharynx is clear and moist.  Eyes: Conjunctivae are normal.  Neck: Normal range of motion. Neck supple.  Cardiovascular: Normal rate, regular rhythm and normal heart sounds.  Pulmonary/Chest: Effort normal and breath sounds normal.  Abdominal: Soft. There is no tenderness.  Exam hindered by morbid obesity  Musculoskeletal: Normal range of motion.  Neurological: He is alert and oriented to person, place,  and time.  Skin: Skin is warm and dry.  Nursing note and vitals reviewed.    UC Treatments / Results  Labs (all labs ordered are listed, but only abnormal results are displayed) Labs Reviewed  POCT URINALYSIS DIP (DEVICE) - Abnormal; Notable for the following components:      Result Value   Protein, ur 30 (*)    Nitrite POSITIVE (*)    Leukocytes, UA SMALL (*)    All other components within normal limits  POCT I-STAT, CHEM 8 - Abnormal; Notable for the following components:   BUN 22 (*)    Glucose, Bld 131 (*)    All other components within normal limits  URINE CULTURE    EKG None  Radiology No results found.  Procedures Procedures (including critical care time)  Medications Ordered in UC Medications - No data to display  Initial Impression / Assessment and Plan / UC Course  I have reviewed the triage vital signs and the nursing notes.  Pertinent labs & imaging results that were available during my care of the patient were reviewed by me and considered in my medical decision making (see chart for details).    Final Clinical Impressions(s) / UC Diagnoses   Final diagnoses:  Lower urinary tract infectious disease     Discharge Instructions     You have evidence in your urine for significant urinary tract infection.  Therefore I am prescribing an antibiotic that needs to be taken twice a day for a week.  You should follow-up with your doctor or this clinic in 1 week to make sure the infection is  completely eradicated.    ED Prescriptions    Medication Sig Dispense Auth. Provider   ciprofloxacin (CIPRO) 500 MG tablet Take 1 tablet (500 mg total) by mouth 2 (two) times daily. 14 tablet Robyn Haber, MD     Controlled Substance Prescriptions [place] Controlled Substance Registry consulted? Not Applicable   Robyn Haber, MD 03/05/18 1612    Robyn Haber, MD 03/05/18 [phone]

## 2018-03-05 NOTE — Discharge Instructions (Signed)
You have evidence in your urine for significant urinary tract infection.  Therefore I am prescribing an antibiotic that needs to be taken twice a day for a week.  You should follow-up with your doctor or this clinic in 1 week to make sure the infection is completely eradicated.

## 2018-03-07 LAB — URINE CULTURE: Culture: >=100000 — AB

## 2018-03-21 ENCOUNTER — Encounter (HOSPITAL_COMMUNITY): Payer: PRIVATE HEALTH INSURANCE

## 2018-03-21 ENCOUNTER — Encounter (HOSPITAL_COMMUNITY): Payer: Self-pay | Admitting: Emergency Medicine

## 2018-03-21 ENCOUNTER — Emergency Department (HOSPITAL_COMMUNITY): Payer: PRIVATE HEALTH INSURANCE

## 2018-03-21 ENCOUNTER — Inpatient Hospital Stay (HOSPITAL_COMMUNITY)
Admission: EM | Admit: 2018-03-21 | Discharge: 2018-03-24 | DRG: 638 | Disposition: A | Discharge status: Home Health | Payer: PRIVATE HEALTH INSURANCE | Attending: Family Medicine | Admitting: Family Medicine

## 2018-03-21 ENCOUNTER — Other Ambulatory Visit: Payer: Self-pay

## 2018-03-21 DIAGNOSIS — Z8631 Personal history of diabetic foot ulcer: Secondary | ICD-10-CM | POA: Diagnosis present

## 2018-03-21 DIAGNOSIS — Z79899 Other long term (current) drug therapy: Secondary | ICD-10-CM

## 2018-03-21 DIAGNOSIS — K760 Fatty (change of) liver, not elsewhere classified: Secondary | ICD-10-CM | POA: Diagnosis present

## 2018-03-21 DIAGNOSIS — Z8249 Family history of ischemic heart disease and other diseases of the circulatory system: Secondary | ICD-10-CM | POA: Diagnosis not present

## 2018-03-21 DIAGNOSIS — Z7984 Long term (current) use of oral hypoglycemic drugs: Secondary | ICD-10-CM

## 2018-03-21 DIAGNOSIS — Z6841 Body Mass Index (BMI) 40.0 and over, adult: Secondary | ICD-10-CM

## 2018-03-21 DIAGNOSIS — L97519 Non-pressure chronic ulcer of other part of right foot with unspecified severity: Secondary | ICD-10-CM | POA: Diagnosis present

## 2018-03-21 DIAGNOSIS — Z86718 Personal history of other venous thrombosis and embolism: Secondary | ICD-10-CM | POA: Diagnosis not present

## 2018-03-21 DIAGNOSIS — L03115 Cellulitis of right lower limb: Secondary | ICD-10-CM | POA: Diagnosis present

## 2018-03-21 DIAGNOSIS — E11621 Type 2 diabetes mellitus with foot ulcer: Secondary | ICD-10-CM | POA: Diagnosis present

## 2018-03-21 DIAGNOSIS — L0390 Cellulitis, unspecified: Secondary | ICD-10-CM | POA: Diagnosis not present

## 2018-03-21 DIAGNOSIS — E1151 Type 2 diabetes mellitus with diabetic peripheral angiopathy without gangrene: Secondary | ICD-10-CM | POA: Diagnosis present

## 2018-03-21 DIAGNOSIS — E1165 Type 2 diabetes mellitus with hyperglycemia: Secondary | ICD-10-CM

## 2018-03-21 DIAGNOSIS — Z86711 Personal history of pulmonary embolism: Secondary | ICD-10-CM | POA: Diagnosis not present

## 2018-03-21 DIAGNOSIS — Z7901 Long term (current) use of anticoagulants: Secondary | ICD-10-CM | POA: Diagnosis not present

## 2018-03-21 DIAGNOSIS — M25571 Pain in right ankle and joints of right foot: Secondary | ICD-10-CM | POA: Diagnosis present

## 2018-03-21 DIAGNOSIS — Z833 Family history of diabetes mellitus: Secondary | ICD-10-CM

## 2018-03-21 DIAGNOSIS — G4733 Obstructive sleep apnea (adult) (pediatric): Secondary | ICD-10-CM | POA: Diagnosis present

## 2018-03-21 DIAGNOSIS — M609 Myositis, unspecified: Secondary | ICD-10-CM | POA: Diagnosis present

## 2018-03-21 DIAGNOSIS — I739 Peripheral vascular disease, unspecified: Secondary | ICD-10-CM | POA: Diagnosis not present

## 2018-03-21 DIAGNOSIS — I10 Essential (primary) hypertension: Secondary | ICD-10-CM | POA: Diagnosis present

## 2018-03-21 DIAGNOSIS — E66813 Obesity, class 3: Secondary | ICD-10-CM

## 2018-03-21 HISTORY — DX: Type 2 diabetes mellitus with foot ulcer: E11.621

## 2018-03-21 HISTORY — DX: Type 2 diabetes mellitus with foot ulcer: L97.519

## 2018-03-21 HISTORY — DX: Non-pressure chronic ulcer of other part of right foot with unspecified severity: L97.519

## 2018-03-21 HISTORY — DX: Essential (primary) hypertension: I10

## 2018-03-21 HISTORY — DX: Obesity, class 3: E66.813

## 2018-03-21 HISTORY — DX: Morbid (severe) obesity due to excess calories: E66.01

## 2018-03-21 LAB — COMPREHENSIVE METABOLIC PANEL
ALT: 17 U/L (ref 0–44)
AST: 19 U/L (ref 15–41)
Albumin: 3.4 g/dL — ABNORMAL LOW (ref 3.5–5.0)
Alkaline Phosphatase: 52 U/L (ref 38–126)
Anion gap: 9 (ref 5–15)
BUN: 18 mg/dL (ref 6–20)
CO2: 23 mmol/L (ref 22–32)
Calcium: 9.1 mg/dL (ref 8.9–10.3)
Chloride: 107 mmol/L (ref 98–111)
Creatinine, Ser: 1.24 mg/dL (ref 0.61–1.24)
GFR calc Af Amer: >60 mL/min (ref >60)
GFR calc non Af Amer: >60 mL/min (ref >60)
Glucose, Bld: 199 mg/dL — ABNORMAL HIGH (ref 70–99)
Potassium: 4 mmol/L (ref 3.5–5.1)
Sodium: 139 mmol/L (ref 135–145)
Total Bilirubin: 0.6 mg/dL (ref 0.3–1.2)
Total Protein: 7 g/dL (ref 6.5–8.1)

## 2018-03-21 LAB — URINALYSIS, ROUTINE W REFLEX MICROSCOPIC
Bilirubin Urine: NEGATIVE
Glucose, UA: NEGATIVE mg/dL
Hgb urine dipstick: NEGATIVE
Ketones, ur: NEGATIVE mg/dL
Leukocytes, UA: NEGATIVE
Nitrite: NEGATIVE
Protein, ur: NEGATIVE mg/dL
Specific Gravity, Urine: 1.018 (ref 1.005–1.030)
pH: 5 (ref 5.0–8.0)

## 2018-03-21 LAB — CBC WITH DIFFERENTIAL/PLATELET
Abs Immature Granulocytes: 0.06 10*3/uL (ref 0.00–0.07)
Basophils Absolute: 0.1 10*3/uL (ref 0.0–0.1)
Basophils Relative: 1 %
Eosinophils Absolute: 1 10*3/uL — ABNORMAL HIGH (ref 0.0–0.5)
Eosinophils Relative: 8 %
HCT: 42.2 % (ref 39.0–52.0)
Hemoglobin: 12.9 g/dL — ABNORMAL LOW (ref 13.0–17.0)
Immature Granulocytes: 1 %
Lymphocytes Relative: 22 %
Lymphs Abs: 2.9 10*3/uL (ref 0.7–4.0)
MCH: 26.9 pg (ref 26.0–34.0)
MCHC: 30.6 g/dL (ref 30.0–36.0)
MCV: 88.1 fL (ref 80.0–100.0)
Monocytes Absolute: 1 10*3/uL (ref 0.1–1.0)
Monocytes Relative: 8 %
Neutro Abs: 8.2 10*3/uL — ABNORMAL HIGH (ref 1.7–7.7)
Neutrophils Relative %: 60 %
Platelets: 261 10*3/uL (ref 150–400)
RBC: 4.79 MIL/uL (ref 4.22–5.81)
RDW: 15.4 % (ref 11.5–15.5)
WBC: 13.3 10*3/uL — ABNORMAL HIGH (ref 4.0–10.5)
nRBC: 0 % (ref 0.0–0.2)

## 2018-03-21 LAB — I-STAT CG4 LACTIC ACID, ED
Lactic Acid, Venous: 1.91 mmol/L — ABNORMAL HIGH (ref 0.5–1.9)
Lactic Acid, Venous: 2.1 mmol/L (ref 0.5–1.9)

## 2018-03-21 LAB — C-REACTIVE PROTEIN: CRP: <0.8 mg/dL (ref <1.0)

## 2018-03-21 LAB — HEMOGLOBIN A1C
Hgb A1c MFr Bld: 7 % — ABNORMAL HIGH (ref 4.8–5.6)
Mean Plasma Glucose: 154.2 mg/dL

## 2018-03-21 LAB — PREALBUMIN: Prealbumin: 15.8 mg/dL — ABNORMAL LOW (ref 18–38)

## 2018-03-21 LAB — GLUCOSE, CAPILLARY
Glucose-Capillary: 125 mg/dL — ABNORMAL HIGH (ref 70–99)
Glucose-Capillary: 118 mg/dL — ABNORMAL HIGH (ref 70–99)

## 2018-03-21 LAB — SEDIMENTATION RATE: Sed Rate: 28 mm/hr — ABNORMAL HIGH (ref 0–16)

## 2018-03-21 LAB — LACTIC ACID, PLASMA: Lactic Acid, Venous: 1.3 mmol/L (ref 0.5–1.9)

## 2018-03-21 LAB — MRSA PCR SCREENING: MRSA by PCR: POSITIVE — AB

## 2018-03-21 LAB — CBG MONITORING, ED: Glucose-Capillary: 119 mg/dL — ABNORMAL HIGH (ref 70–99)

## 2018-03-21 MED ORDER — FUROSEMIDE 40 MG PO TABS: 40.0000 mg | ORAL_TABLET | Freq: Every day | ORAL | Status: DC | PRN

## 2018-03-21 MED ORDER — BACITRACIN ZINC 500 UNIT/GM EX OINT: TOPICAL_OINTMENT | Freq: Two times a day (BID) | CUTANEOUS | Status: DC

## 2018-03-21 MED ORDER — ADULT MULTIVITAMIN W/MINERALS CH
1.0000 | ORAL_TABLET | Freq: Every day | ORAL | Status: DC
  Administered 2018-03-21 – 2018-03-24 (×4): 1 via ORAL
  Filled 2018-03-21 (×4): qty 1

## 2018-03-21 MED ORDER — VANCOMYCIN HCL IN DEXTROSE 1-5 GM/200ML-% IV SOLN: 1000.0000 mg | Freq: Once | INTRAVENOUS | Status: DC

## 2018-03-21 MED ORDER — SODIUM CHLORIDE 0.9 % IV BOLUS
1000.0000 mL | Freq: Once | INTRAVENOUS | Status: AC
  Administered 2018-03-21: 1000 mL via INTRAVENOUS

## 2018-03-21 MED ORDER — LISINOPRIL 5 MG PO TABS
2.5000 mg | ORAL_TABLET | Freq: Every day | ORAL | Status: DC
  Administered 2018-03-21 – 2018-03-24 (×4): 2.5 mg via ORAL
  Filled 2018-03-21 (×4): qty 1

## 2018-03-21 MED ORDER — IBUPROFEN 600 MG PO TABS: 600.0000 mg | ORAL_TABLET | Freq: Four times a day (QID) | ORAL | Status: DC | PRN

## 2018-03-21 MED ORDER — METRONIDAZOLE 500 MG PO TABS
500.0000 mg | ORAL_TABLET | Freq: Three times a day (TID) | ORAL | Status: DC
  Administered 2018-03-21 – 2018-03-22 (×3): 500 mg via ORAL
  Filled 2018-03-21 (×3): qty 1

## 2018-03-21 MED ORDER — INSULIN ASPART 100 UNIT/ML ~~LOC~~ SOLN
0.0000 [IU] | Freq: Three times a day (TID) | SUBCUTANEOUS | Status: DC
  Administered 2018-03-21 – 2018-03-22 (×4): 3 [IU] via SUBCUTANEOUS
  Administered 2018-03-23: 4 [IU] via SUBCUTANEOUS
  Administered 2018-03-23: 3 [IU] via SUBCUTANEOUS
  Administered 2018-03-24: 4 [IU] via SUBCUTANEOUS

## 2018-03-21 MED ORDER — SODIUM CHLORIDE 0.9 % IV SOLN
2.0000 g | INTRAVENOUS | Status: DC
  Administered 2018-03-21 – 2018-03-23 (×3): 2 g via INTRAVENOUS
  Filled 2018-03-21 (×3): qty 20

## 2018-03-21 MED ORDER — OXYCODONE HCL 5 MG PO TABS
5.0000 mg | ORAL_TABLET | ORAL | Status: DC | PRN
  Administered 2018-03-21 – 2018-03-24 (×7): 5 mg via ORAL
  Filled 2018-03-21 (×7): qty 1

## 2018-03-21 MED ORDER — VANCOMYCIN HCL 10 G IV SOLR
2000.0000 mg | Freq: Once | INTRAVENOUS | Status: AC
  Administered 2018-03-21: 2000 mg via INTRAVENOUS
  Filled 2018-03-21: qty 2000

## 2018-03-21 MED ORDER — APIXABAN 5 MG PO TABS
5.0000 mg | ORAL_TABLET | Freq: Two times a day (BID) | ORAL | Status: DC
  Administered 2018-03-21 – 2018-03-24 (×6): 5 mg via ORAL
  Filled 2018-03-21 (×6): qty 1

## 2018-03-21 MED ORDER — VANCOMYCIN HCL IN DEXTROSE 1-5 GM/200ML-% IV SOLN
1000.0000 mg | Freq: Three times a day (TID) | INTRAVENOUS | Status: DC
  Administered 2018-03-21 – 2018-03-23 (×6): 1000 mg via INTRAVENOUS
  Filled 2018-03-21 (×6): qty 200

## 2018-03-21 MED ORDER — MORPHINE SULFATE (PF) 4 MG/ML IV SOLN
4.0000 mg | Freq: Once | INTRAVENOUS | Status: AC
  Administered 2018-03-21: 4 mg via INTRAVENOUS
  Filled 2018-03-21: qty 1

## 2018-03-21 MED ORDER — OXYCODONE HCL 5 MG PO TABS: 5.0000 mg | ORAL_TABLET | ORAL | Status: DC | PRN

## 2018-03-21 MED ORDER — PANTOPRAZOLE SODIUM 40 MG PO TBEC
40.0000 mg | DELAYED_RELEASE_TABLET | Freq: Every day | ORAL | Status: DC
  Administered 2018-03-21 – 2018-03-24 (×4): 40 mg via ORAL
  Filled 2018-03-21 (×4): qty 1

## 2018-03-21 MED ORDER — HEPARIN SODIUM (PORCINE) 5000 UNIT/ML IJ SOLN: 5000.0000 [IU] | Freq: Three times a day (TID) | INTRAMUSCULAR | Status: DC

## 2018-03-21 NOTE — ED Notes (Signed)
RN called and advised pt's bariatric bed is ready. Admitting MD in w/pt.

## 2018-03-21 NOTE — ED Notes (Signed)
Pt arrived to F8 via stretcher. Pt noted to be wearing personal clothing and his eyeglasses. Right foot/ankle/lower leg noted w/swelling and weeping. Pt given remote for tv.

## 2018-03-21 NOTE — H&P (Signed)
History and Physical    Evan Leonard DVV:[id number] DOB: 07-15-1960 DOA: 03/21/2018  PCP: [name], MD   Patient coming from: Home.  I have personally briefly reviewed patient's old medical records in [place]  Chief Complaint: Right foot infection.  HPI: Evan Leonard is a 57 y.o. male with medical history significant of type 2 diabetes, chronic right foot ulcer, history of DVT, history of PE, history of herpes zoster, hypertension, obesity, OSA on CPAP, peripheral vascular disease, history of pyelonephritis, history of small bowel obstruction, history of varicose veins who is coming to the emergency department with complaints of right ankle edema, tenderness, erythema associated with serous purulent discharge.  He denies fever, chills, but feels tired.  No headache, sore throat, rhinorrhea, wheezing or hemoptysis.  He gets dyspneic easily on exertion, but denies chest pain, palpitations, dizziness or diaphoresis.  No abdominal pain, nausea, emesis, diarrhea or constipation.  No melena or hematochezia.  Denies dysuria, frequency or hematuria.  Denies polyuria, polydipsia, polyphagia or blurred vision.  However states that his capillary blood glucose has been elevated in the high 200s and low 300s since he developed symptoms on his ankle.  ED Course:  Initial vital signs were temperature 98.5 F, pulse 113, respiration 20, blood pressure 120/81 mmHg and O2 sat 98% on room air.  He received IV fluids, vancomycin, ceftriaxone and Flagyl.  His urinalysis was normal.  White count was 13.3, hemoglobin 12.9 g/dL and platelets 261.  Lactic acid was 1.91 and then 2.10.  His CMP showed a glucose of 199 and an albumin of 3.4 g/dL, but was otherwise normal.  Right ankle x-ray shows soft tissue swelling, but no radiographic evidence of hold furosemide for now osteomyelitis.  Review of Systems: As per HPI otherwise 10 point review of systems negative.   Past Medical History:  Diagnosis Date   . Diabetes mellitus, type II ([place]) 02/2013  . Diabetic ulcer of right foot due to type 2 diabetes mellitus ([place]) 03/21/2018  . DVT (deep venous thrombosis) ([place]) 05/04/2012   LLE  . Exertional dyspnea 05/04/2012   "isolated episode" (05/05/2012)  . Hepatic steatosis   . History of chickenpox   . Hypertension 03/21/2018  . Obesity   . Obesity, Class III, BMI 40-49.9 (morbid obesity) ([place]) 03/21/2018  . OSA on CPAP   . Peripheral vascular disease (HCC)    varicose veins  . Pulmonary embolism ([place]) 05/04/2012   bilaterally  . Pyelonephritis 04/2017  . Small bowel obstruction ([place]) 09/06/2015  . Varicose veins     Past Surgical History:  Procedure Laterality Date  . CYSTOSCOPY W/ URETERAL STENT PLACEMENT Right 05/12/2017   Procedure: CYSTOSCOPY WITH RETROGRADE PYELOGRAM/URETERAL STENT PLACEMENT;  Surgeon: [name], MD;  Location: [place];  Service: Urology;  Laterality: Right;  . CYSTOSCOPY/URETEROSCOPY/HOLMIUM LASER/STENT PLACEMENT Right 07/29/2017   Procedure: CYSTOSCOPY RIGHT URETEROSCOPY HOLMIUM LASER STENT EXCHANGE;  Surgeon: [name], MD;  [facility];  Service: Urology;  Laterality: Right;  . HERNIA REPAIR     2015  . LAPAROTOMY N/A 12/13/2013   Procedure: EXPLORATORY LAPAROTOMY Repair ventral hernia, without mesh, partial omentectomy;  Surgeon: Gwenyth Ober, MD;  Location: [place];  Service: General;  Laterality: N/A;  . VENTRAL HERNIA REPAIR N/A 04/26/2017   Procedure: INCARCERATED HERNIA REPAIR VENTRAL ADULT;  Surgeon: Georganna Skeans, MD;  Location: [place];  Service: General;  Laterality: N/A;     reports that he has never smoked. He has never used smokeless tobacco. He reports  that he does not drink alcohol or use drugs.  No Known Allergies  Family History  Problem Relation Age of Onset  . Diabetes Mother        died age 49- also bleeding ulcer  . Hypertension Mother   . Heart failure Father        in his 49s (patient was 58). day before surgery planned  . Other  Maternal Grandmother        states natural causes all grandparents    Prior to Admission medications   Medication Sig Start Date End Date Taking? Authorizing Provider  apixaban (ELIQUIS) 5 MG TABS tablet Take 1 tablet (5 mg total) by mouth 2 (two) times daily. 07/17/17  Yes [name], MD  furosemide (LASIX) 40 MG tablet Take 1 tablet (40 mg total) by mouth daily as needed for fluid or edema. 02/09/18  Yes [name], MD  ibuprofen (ADVIL,MOTRIN) 200 MG tablet Take 600 mg by mouth every 6 (six) hours as needed for moderate pain.   Yes [provider]  lisinopril (PRINIVIL,ZESTRIL) 2.5 MG tablet Take 1 tablet (2.5 mg total) by mouth daily. 06/20/17  Yes [name], MD  metFORMIN (GLUCOPHAGE) 500 MG tablet Take 2 tablets (1,000 mg total) by mouth 2 (two) times daily with a meal. 08/26/17  Yes [name], MD  Multiple Vitamin (MULTIVITAMIN WITH MINERALS) TABS tablet Take 1 tablet by mouth daily.    Yes [provider]  cephALEXin (KEFLEX) 500 MG capsule Take 1 capsule (500 mg total) by mouth 3 (three) times daily. Patient not taking: Reported on 03/21/2018 02/02/18   Jaynee Eagles, PA-C  ciprofloxacin (CIPRO) 500 MG tablet Take 1 tablet (500 mg total) by mouth 2 (two) times daily. Patient not taking: Reported on 03/21/2018 03/05/18   Robyn Haber, MD  oxyCODONE (ROXICODONE) 5 MG immediate release tablet Take 1 tablet (5 mg total) by mouth every 4 (four) hours as needed for severe pain. Patient not taking: Reported on 03/21/2018 07/29/17   [name], MD  triamcinolone cream (KENALOG) 0.1 % Apply 1 application topically 2 (two) times daily. For 7-10 days maximum Patient not taking: Reported on 03/21/2018 06/18/17   [name], MD    Physical Exam: Vitals:   03/21/18 0318 03/21/18 0738 03/21/18 1124  BP: 120/81 (!) 142/89 135/71  Pulse: (!) 113 90 76  Resp: '20 18 18  '$ Temp: 98.5 F (36.9 C)    TempSrc: Oral    SpO2: 98% 97% 95%  Weight: (!)  158.8 kg    Height: 6' 2.5" (1.892 m)      Constitutional: NAD, calm, comfortable Eyes: PERRL, lids and conjunctivae normal ENMT: Mucous membranes are moist. Posterior pharynx clear of any exudate or lesions. Neck: Normal, supple, no masses, no thyromegaly Respiratory: Clear to auscultation bilaterally, no wheezing, no crackles. Normal respiratory effort. No accessory muscle use.  Cardiovascular: Regular rate and rhythm, no murmurs / rubs / gallops.  Significant edema and lymphedema trace LLE pitting edema.  2+ pedal pulses. No carotid bruits.  Abdomen: Obese, soft, no tenderness, no masses palpated. No hepatosplenomegaly. Bowel sounds positive.  Musculoskeletal: no clubbing / cyanosis. Good ROM, no contractures. Normal muscle tone.  Skin: Extensive erythema, edema, calor and tenderness of the right foot and lower right leg.  There is serous purulent discharge from area.  Please see picture below. Neurologic: CN 2-12 grossly intact. Sensation intact, DTR normal. Strength 5/5 in all 4.  Psychiatric: Normal judgment and insight. Alert  and oriented x 3. Normal mood.       Labs on Admission: I have personally reviewed following labs and imaging studies  CBC: Recent Labs  Lab 03/21/18 0348  WBC 13.3*  NEUTROABS 8.2*  HGB 12.9*  HCT 42.2  MCV 88.1  PLT 295   Basic Metabolic Panel: Recent Labs  Lab 03/21/18 0348  NA 139  K 4.0  CL 107  CO2 23  GLUCOSE 199*  BUN 18  CREATININE 1.24  CALCIUM 9.1   GFR: Estimated Creatinine Clearance: 104.3 mL/min (by C-G formula based on SCr of 1.24 mg/dL). Liver Function Tests: Recent Labs  Lab 03/21/18 0348  AST 19  ALT 17  ALKPHOS 52  BILITOT 0.6  PROT 7.0  ALBUMIN 3.4*   No results for input(s): LIPASE, AMYLASE in the last 168 hours. No results for input(s): AMMONIA in the last 168 hours. Coagulation Profile: No results for input(s): INR, PROTIME in the last 168 hours. Cardiac Enzymes: No results for input(s): CKTOTAL, CKMB,  CKMBINDEX, TROPONINI in the last 168 hours. BNP (last 3 results) No results for input(s): PROBNP in the last 8760 hours. HbA1C: No results for input(s): HGBA1C in the last 72 hours. CBG: Recent Labs  Lab 03/21/18 1200  GLUCAP 119*   Lipid Profile: No results for input(s): CHOL, HDL, LDLCALC, TRIG, CHOLHDL, LDLDIRECT in the last 72 hours. Thyroid Function Tests: No results for input(s): TSH, T4TOTAL, FREET4, T3FREE, THYROIDAB in the last 72 hours. Anemia Panel: No results for input(s): VITAMINB12, FOLATE, FERRITIN, TIBC, IRON, RETICCTPCT in the last 72 hours. Urine analysis:    Component Value Date/Time   COLORURINE YELLOW 03/21/2018 0346   APPEARANCEUR CLEAR 03/21/2018 0346   LABSPEC 1.018 03/21/2018 0346   PHURINE 5.0 03/21/2018 0346   GLUCOSEU NEGATIVE 03/21/2018 0346   HGBUR NEGATIVE 03/21/2018 0346   BILIRUBINUR NEGATIVE 03/21/2018 0346   KETONESUR NEGATIVE 03/21/2018 0346   PROTEINUR NEGATIVE 03/21/2018 0346   UROBILINOGEN 0.2 03/05/2018 1539   NITRITE NEGATIVE 03/21/2018 0346   LEUKOCYTESUR NEGATIVE 03/21/2018 0346    Radiological Exams on Admission: Dg Ankle Complete Right  Result Date: 03/21/2018 CLINICAL DATA:  57 year old male with right and ankle swelling and redness. Evaluate for cellulitis. EXAM: RIGHT ANKLE - COMPLETE 3+ VIEW COMPARISON:  Radiograph dated 02/02/2018 FINDINGS: There is no acute fracture or dislocation. There is a 1 cm bone spurring of the plantar calcaneus. There is diffuse subcutaneous edema. IMPRESSION: 1. No acute fracture or dislocation. 2. Diffuse subcutaneous edema, possibly cellulitis. Clinical correlation is recommended. Electronically Signed   By: Anner Crete M.D.   On: 03/21/2018 03:57    EKG: Independently reviewed.  Vent. rate 79 BPM PR interval 164 ms QRS duration 88 ms QT/QTc 360/412 ms P-R-T axes 48 36 41 Normal sinus rhythm Normal ECG  Assessment/Plan Principal Problem:   Diabetic ulcer of right foot due to type 2  diabetes mellitus ([place]) Admit to MedSurg/inpatient. Continue supplemental oxygen. Continue IV fluids. Analgesics as needed. Continue vancomycin, ceftriaxone and metronidazole. Check CRP and ESR. Check prealbumin and hemoglobin A1c. Check lower extremities arterial Doppler. Check MRI of foot.  Active Problems:   Obstructive sleep apnea Not on CPAP. Continue supplemental oxygen.    History of pulmonary embolus (PE) Continue apixaban.    Hypertension Continue lisinopril 2.5 mg p.o. daily. Hold furosemide for now. Monitor blood pressure, renal function electrolytes.    Obesity, Class III, BMI 40-49.9 (morbid obesity) (HCC) Needs significant lifestyle modifications.    DVT prophylaxis: Heparin SQ. Code Status:  Full code. Family Communication: Disposition Plan: Admit for IV antibiotic therapy and further work-up. Consults called: Admission status: Inpatient/MedSurg.   Reubin Milan MD [facility] Pager [phone].  If 7PM-7AM, please contact night-coverage [email or web address] Password [facility]  03/21/2018, 12:24 PM

## 2018-03-21 NOTE — ED Provider Notes (Addendum)
[facility] EMERGENCY DEPARTMENT Provider Note   CSN: [id number] Arrival date & time: 03/21/18  0302     History   Chief Complaint Chief Complaint  Patient presents with  . Ankle Pain    r/o cellulitis    HPI Evan Leonard is a 57 y.o. male with history of pulmonary embolism and DVT anticoagulated on Eliquis, diabetes, PVD who presents with a 3 to 4-day history of right ankle pain and redness.  He has had drainage and weeping from the area.  He denies any fevers.  He reports having cellulitis on his right leg before, however never this bad.  He was treated in September with Keflex which cleared it up mostly, however it returned a few days ago.  Patient has been taking ibuprofen at home with some relief of the pain.  He reports his sock has been completely soaked from the weeping.  He denies any chest pain, shortness of breath, abdominal pain, nausea, vomiting. Peripheral history obtained from medical record.  HPI  Past Medical History:  Diagnosis Date  . Diabetes mellitus, type II ([place]) 02/2013  . DVT (deep venous thrombosis) ([place]) 05/04/2012   LLE  . Exertional dyspnea 05/04/2012   "isolated episode" (05/05/2012)  . Hepatic steatosis   . History of chickenpox   . Obesity   . OSA on CPAP   . Peripheral vascular disease ([facility])    varicose veins  . Pulmonary embolism ([place]) 05/04/2012   bilaterally  . Pyelonephritis 04/2017  . Small bowel obstruction ([place]) 09/06/2015  . Varicose veins     Patient Active Problem List   Diagnosis Date Noted  . Diabetic ulcer of right foot due to type 2 diabetes mellitus ([place]) 03/21/2018  . Atypical chest pain 02/09/2018  . Renal stone 05/12/2017  . S/P repair of recurrent ventral hernia 04/26/2017  . Chronic venous insufficiency 01/25/2015  . Diabetes mellitus with hyperglycemia ([place]) 03/31/2013  . Hyponatremia 03/23/2013  . Pulmonary embolism ([place]) 05/05/2012  . Morbid obesity with body mass index of 40.0-44.9 in adult  [facility]) 05/05/2012  . Obstructive sleep apnea 05/05/2012  . DVT of left distal popliteal vein 05/05/2012    Past Surgical History:  Procedure Laterality Date  . CYSTOSCOPY W/ URETERAL STENT PLACEMENT Right 05/12/2017   Procedure: CYSTOSCOPY WITH RETROGRADE PYELOGRAM/URETERAL STENT PLACEMENT;  Surgeon: [name], MD;  Location: [facility];  Service: Urology;  Laterality: Right;  . CYSTOSCOPY/URETEROSCOPY/HOLMIUM LASER/STENT PLACEMENT Right 07/29/2017   Procedure: CYSTOSCOPY RIGHT URETEROSCOPY HOLMIUM LASER STENT EXCHANGE;  Surgeon: [name], MD;  Location: [facility];  Service: Urology;  Laterality: Right;  . HERNIA REPAIR     2015  . LAPAROTOMY N/A 12/13/2013   Procedure: EXPLORATORY LAPAROTOMY Repair ventral hernia, without mesh, partial omentectomy;  Surgeon: Gwenyth Ober, MD;  Location: [facility];  Service: General;  Laterality: N/A;  . VENTRAL HERNIA REPAIR N/A 04/26/2017   Procedure: INCARCERATED HERNIA REPAIR VENTRAL ADULT;  Surgeon: Georganna Skeans, MD;  Location: [facility];  Service: General;  Laterality: N/A;        Home Medications    Prior to Admission medications   Medication Sig Start Date End Date Taking? Authorizing Provider  apixaban (ELIQUIS) 5 MG TABS tablet Take 1 tablet (5 mg total) by mouth 2 (two) times daily. 07/17/17  Yes [name], MD  furosemide (LASIX) 40 MG tablet Take 1 tablet (40 mg total) by mouth daily as needed for fluid or edema. 02/09/18  Yes [name], MD  ibuprofen (  ADVIL,MOTRIN) 200 MG tablet Take 600 mg by mouth every 6 (six) hours as needed for moderate pain.   Yes [provider]  lisinopril (PRINIVIL,ZESTRIL) 2.5 MG tablet Take 1 tablet (2.5 mg total) by mouth daily. 06/20/17  Yes [name], MD  metFORMIN (GLUCOPHAGE) 500 MG tablet Take 2 tablets (1,000 mg total) by mouth 2 (two) times daily with a meal. 08/26/17  Yes [name], MD  Multiple Vitamin (MULTIVITAMIN WITH MINERALS) TABS tablet Take 1 tablet by mouth daily.    Yes  [provider]  cephALEXin (KEFLEX) 500 MG capsule Take 1 capsule (500 mg total) by mouth 3 (three) times daily. Patient not taking: Reported on 03/21/2018 02/02/18   Jaynee Eagles, PA-C  ciprofloxacin (CIPRO) 500 MG tablet Take 1 tablet (500 mg total) by mouth 2 (two) times daily. Patient not taking: Reported on 03/21/2018 03/05/18   Robyn Haber, MD  oxyCODONE (ROXICODONE) 5 MG immediate release tablet Take 1 tablet (5 mg total) by mouth every 4 (four) hours as needed for severe pain. Patient not taking: Reported on 03/21/2018 07/29/17   [name], MD  triamcinolone cream (KENALOG) 0.1 % Apply 1 application topically 2 (two) times daily. For 7-10 days maximum Patient not taking: Reported on 03/21/2018 06/18/17   [name], MD    Family History Family History  Problem Relation Age of Onset  . Diabetes Mother        died age 52- also bleeding ulcer  . Hypertension Mother   . Heart failure Father        in his 53s (patient was 29). day before surgery planned  . Other Maternal Grandmother        states natural causes all grandparents    Social History Social History   Tobacco Use  . Smoking status: Never Smoker  . Smokeless tobacco: Never Used  Substance Use Topics  . Alcohol use: No    Alcohol/week: 0.0 standard drinks  . Drug use: No     Allergies   Patient has no known allergies.   Review of Systems Review of Systems  Constitutional: Negative for chills and fever.  HENT: Negative for facial swelling and sore throat.   Respiratory: Negative for shortness of breath.   Cardiovascular: Negative for chest pain.  Gastrointestinal: Negative for abdominal pain, nausea and vomiting.  Genitourinary: Negative for dysuria.  Musculoskeletal: Negative for back pain.  Skin: Positive for color change and wound. Negative for rash.  Neurological: Negative for headaches.  Psychiatric/Behavioral: The patient is not nervous/anxious.      Physical Exam Updated  Vital Signs BP (!) 142/89 (BP Location: Left Arm)   Pulse 90   Temp 98.5 F (36.9 C) (Oral)   Resp 18   Ht 6' 2.5" (1.892 m)   Wt (!) 158.8 kg   SpO2 97%   BMI 44.34 kg/m   Physical Exam  Constitutional: He appears well-developed and well-nourished. No distress.  Obese  HENT:  Head: Normocephalic and atraumatic.  Mouth/Throat: Oropharynx is clear and moist. No oropharyngeal exudate.  Eyes: Pupils are equal, round, and reactive to light. Conjunctivae are normal. Right eye exhibits no discharge. Left eye exhibits no discharge. No scleral icterus.  Neck: Normal range of motion. Neck supple. No thyromegaly present.  Cardiovascular: Normal rate, regular rhythm, normal heart sounds and intact distal pulses. Exam reveals no gallop and no friction rub.  No murmur heard. Pulmonary/Chest: Effort normal and breath sounds normal. No stridor. No respiratory distress. He has  no wheezes. He has no rales.  Abdominal: Soft. Bowel sounds are normal. He exhibits no distension. There is no tenderness. There is no rebound and no guarding.  Musculoskeletal: He exhibits no edema.  Lymphadenopathy:    He has no cervical adenopathy.  Neurological: He is alert. Coordination normal.  Skin: Skin is warm and dry. No rash noted. He is not diaphoretic. No pallor.  Significant erythema with skin breakdown to the right ankle to mid calf, circumferential, purulent, weeping drainage; see photo  Psychiatric: He has a normal mood and affect.  Nursing note and vitals reviewed.      ED Treatments / Results  Labs (all labs ordered are listed, but only abnormal results are displayed) Labs Reviewed  COMPREHENSIVE METABOLIC PANEL - Abnormal; Notable for the following components:      Result Value   Glucose, Bld 199 (*)    Albumin 3.4 (*)    All other components within normal limits  CBC WITH DIFFERENTIAL/PLATELET - Abnormal; Notable for the following components:   WBC 13.3 (*)    Hemoglobin 12.9 (*)    Neutro  Abs 8.2 (*)    Eosinophils Absolute 1.0 (*)    All other components within normal limits  I-STAT CG4 LACTIC ACID, ED - Abnormal; Notable for the following components:   Lactic Acid, Venous 1.91 (*)    All other components within normal limits  I-STAT CG4 LACTIC ACID, ED - Abnormal; Notable for the following components:   Lactic Acid, Venous 2.10 (*)    All other components within normal limits  CULTURE, BLOOD (ROUTINE X 2)  CULTURE, BLOOD (ROUTINE X 2)  AEROBIC CULTURE (SUPERFICIAL SPECIMEN)  URINALYSIS, ROUTINE W REFLEX MICROSCOPIC    EKG None  Radiology Dg Ankle Complete Right  Result Date: 03/21/2018 CLINICAL DATA:  57 year old male with right and ankle swelling and redness. Evaluate for cellulitis. EXAM: RIGHT ANKLE - COMPLETE 3+ VIEW COMPARISON:  Radiograph dated 02/02/2018 FINDINGS: There is no acute fracture or dislocation. There is a 1 cm bone spurring of the plantar calcaneus. There is diffuse subcutaneous edema. IMPRESSION: 1. No acute fracture or dislocation. 2. Diffuse subcutaneous edema, possibly cellulitis. Clinical correlation is recommended. Electronically Signed   By: Anner Crete M.D.   On: 03/21/2018 03:57    Procedures Procedures (including critical care time)  Medications Ordered in ED Medications  cefTRIAXone (ROCEPHIN) 2 g in sodium chloride 0.9 % 100 mL IVPB (2 g Intravenous New Bag/Given 03/21/18 0853)  vancomycin (VANCOCIN) 2,000 mg in sodium chloride 0.9 % 500 mL IVPB (has no administration in time range)  vancomycin (VANCOCIN) IVPB 1000 mg/200 mL premix (has no administration in time range)  morphine 4 MG/ML injection 4 mg (has no administration in time range)  sodium chloride 0.9 % bolus 1,000 mL (1,000 mLs Intravenous New Bag/Given 03/21/18 4081)     Initial Impression / Assessment and Plan / ED Course  I have reviewed the triage vital signs and the nursing notes.  Pertinent labs & imaging results that were available during my care of the  patient were reviewed by me and considered in my medical decision making (see chart for details).     Patient presenting with cellulitis to right ankle.  Patient with diabetes and PVD. Peripheral history obtained from medical record. X-ray shows diffuse subcutaneous edema, possibly cellulitis, no bony abnormalities.  WBC 13.3, hemoglobin 12.9.  CMP glucose 199, albumin 3.4.  Initial lactic 1.91, repeated before fluids, 2.10.  Blood culture sent.  Patient with stable  vitals.  Code sepsis not called, however 1 L IV fluids and antibiotics initiated in the ED.  Wound culture sent.  I spoke with [facility] Olevia Bowens with [facility] who accepts patient for admission.  Patient also evaluated by my attending, Dr. Sabra Heck, who guided the patient's management and agrees with plan.  Final Clinical Impressions(s) / ED Diagnoses   Final diagnoses:  Cellulitis of right lower extremity    ED Discharge Orders    None         Frederica Kuster, PA-C 03/21/18 1123    Noemi Chapel, MD 03/23/18 1234

## 2018-03-21 NOTE — Consult Note (Signed)
[facility] Nurse wound consult note Reason for Consult: Patient with Diabetes and previous history of DVTs and cellulitis. Right LE cellulitis with partial thickness wound on medial heel. Heel and lower LE are weeping light yellow serous exudate. LE is erythematous, edematous and warm to the touch as compared to LLE. Wound type: Venous insufficiency, infection Pressure Injury POA: N/A Measurement: Affected area measures 30cm x 24cm Wound bed: partial thickness open areas on medial heel and posterior LE Drainage (amount, consistency, odor) As described above. Decreasing since patient in bed with LE elevated Periwound: White callus and maceration surrounds the heel injury (See photo taken by PA in ED today) Dressing procedure/placement/frequency: I will implement a conservative POC using xeroform gauze as an astringent and antimicrobial dressing and ask Nursing to use a pressure redistribution heel boot for pressure injury prevention. Compression is not indicated until we determine if there is a thrombus present. I have asked nursing to outline the area of erythema with a skin marking pen.  Patient is in a bariatric bed with therapeutic mattress and low air loss feature.  [facility] nursing team will not follow, but will remain available to this patient, the nursing and medical teams.  Please re-consult if needed. Thanks, Maudie Flakes, MSN, RN, [place], Arther Abbott  Pager# [phone]

## 2018-03-21 NOTE — ED Triage Notes (Signed)
Pt arrives with R ankle swelling and pain, redness and weeping. Hx diabetes.

## 2018-03-21 NOTE — ED Provider Notes (Signed)
Medical screening examination/treatment/procedure(s) were conducted as a shared visit with non-physician practitioner(s) and myself.  I personally evaluated the patient during the encounter.  Clinical Impression:   Final diagnoses:  Cellulitis of right lower extremity    The pt has had worsening redness and swelling of the R leg from the foot to the ankle and up to the knee - has some increased weakness, no vomiting, no fevers, no systemic sx - but as a diabetic he is at risk for worsening infection - has ttp and weepign of the foot - no fever but has leukocytosis - lactic acid just over 2 - fluids and antibiotics.  Imaging to look for osteo iniitally n eg -   Anticipate admit for abx therapy.  Pt agreeable.  ED ECG REPORT  I personally interpreted this EKG   Date: 03/23/2018   Rate: 79  Rhythm: normal sinus rhythm  QRS Axis: normal  Intervals: normal  ST/T Wave abnormalities: normal  Conduction Disutrbances:none  Narrative Interpretation:   Old EKG Reviewed: none available    Noemi Chapel, MD 03/23/18 1233

## 2018-03-21 NOTE — ED Notes (Signed)
Attempted to call report - was advised RN will be assigned and return call.

## 2018-03-21 NOTE — Progress Notes (Signed)
Pharmacy Antibiotic Note  Nickie Deren is a 57 y.o. male admitted on 03/21/2018 with RLE cellulitis.  Pharmacy has been consulted for Vancomycin  dosing.  Plan: Vancomycin 2 g IV now, then 1 g IV q8h  Height: 6' 2.5" (189.2 cm) Weight: (!) 350 lb (158.8 kg) IBW/kg (Calculated) : 83.35  Temp (24hrs), Avg:98.5 F (36.9 C), Min:98.5 F (36.9 C), Max:98.5 F (36.9 C)  Recent Labs  Lab 03/21/18 0348 03/21/18 0357  WBC 13.3*  --   CREATININE 1.24  --   LATICACIDVEN  --  1.91*    Estimated Creatinine Clearance: 104.3 mL/min (by C-G formula based on SCr of 1.24 mg/dL).    No Known Allergies  Caryl Pina 03/21/2018 7:50 AM

## 2018-03-21 NOTE — Progress Notes (Signed)
Patient arrived to 6n16 A&Ox4, VSS, IV intact.  Self ambulated to bed.  No family at bedside.  Oriented to room and equipment.  Will continue to monitor.

## 2018-03-21 NOTE — ED Notes (Signed)
Pt aware Heart Healthy diet has been ordered so no longer NPO.

## 2018-03-22 ENCOUNTER — Inpatient Hospital Stay (HOSPITAL_COMMUNITY): Payer: PRIVATE HEALTH INSURANCE

## 2018-03-22 DIAGNOSIS — L0390 Cellulitis, unspecified: Secondary | ICD-10-CM

## 2018-03-22 DIAGNOSIS — I739 Peripheral vascular disease, unspecified: Secondary | ICD-10-CM

## 2018-03-22 LAB — CBC WITH DIFFERENTIAL/PLATELET
Abs Immature Granulocytes: 0.03 10*3/uL (ref 0.00–0.07)
Basophils Absolute: 0.1 10*3/uL (ref 0.0–0.1)
Basophils Relative: 1 %
Eosinophils Absolute: 0.8 10*3/uL — ABNORMAL HIGH (ref 0.0–0.5)
Eosinophils Relative: 7 %
HCT: 39.8 % (ref 39.0–52.0)
Hemoglobin: 12.1 g/dL — ABNORMAL LOW (ref 13.0–17.0)
Immature Granulocytes: 0 %
Lymphocytes Relative: 21 %
Lymphs Abs: 2.3 10*3/uL (ref 0.7–4.0)
MCH: 26.7 pg (ref 26.0–34.0)
MCHC: 30.4 g/dL (ref 30.0–36.0)
MCV: 87.9 fL (ref 80.0–100.0)
Monocytes Absolute: 0.9 10*3/uL (ref 0.1–1.0)
Monocytes Relative: 9 %
Neutro Abs: 6.8 10*3/uL (ref 1.7–7.7)
Neutrophils Relative %: 62 %
Platelets: 229 10*3/uL (ref 150–400)
RBC: 4.53 MIL/uL (ref 4.22–5.81)
RDW: 15.7 % — ABNORMAL HIGH (ref 11.5–15.5)
WBC: 10.9 10*3/uL — ABNORMAL HIGH (ref 4.0–10.5)
nRBC: 0 % (ref 0.0–0.2)

## 2018-03-22 LAB — GLUCOSE, CAPILLARY
Glucose-Capillary: 139 mg/dL — ABNORMAL HIGH (ref 70–99)
Glucose-Capillary: 131 mg/dL — ABNORMAL HIGH (ref 70–99)
Glucose-Capillary: 144 mg/dL — ABNORMAL HIGH (ref 70–99)
Glucose-Capillary: 94 mg/dL (ref 70–99)

## 2018-03-22 LAB — BASIC METABOLIC PANEL
Anion gap: 7 (ref 5–15)
BUN: 15 mg/dL (ref 6–20)
CO2: 22 mmol/L (ref 22–32)
Calcium: 8.6 mg/dL — ABNORMAL LOW (ref 8.9–10.3)
Chloride: 107 mmol/L (ref 98–111)
Creatinine, Ser: 1.22 mg/dL (ref 0.61–1.24)
GFR calc Af Amer: >60 mL/min (ref >60)
GFR calc non Af Amer: >60 mL/min (ref >60)
Glucose, Bld: 140 mg/dL — ABNORMAL HIGH (ref 70–99)
Potassium: 4.1 mmol/L (ref 3.5–5.1)
Sodium: 136 mmol/L (ref 135–145)

## 2018-03-22 LAB — HIV ANTIBODY (ROUTINE TESTING W REFLEX): HIV Screen 4th Generation wRfx: NONREACTIVE

## 2018-03-22 MED ORDER — HYDRALAZINE HCL 20 MG/ML IJ SOLN: 10.0000 mg | Freq: Four times a day (QID) | INTRAMUSCULAR | Status: DC | PRN

## 2018-03-22 NOTE — Progress Notes (Signed)
Patient Demographics:    Evan Leonard, is a 57 y.o. male, DOB - 02-Apr-1961, ZOX:[id number]  Admit date - 03/21/2018   Admitting Physician Reubin Milan, MD  Outpatient Primary MD for the patient is [name], MD  LOS - 1   Chief Complaint  Patient presents with  . Ankle Pain    r/o cellulitis        Subjective:    Evan Leonard today has no fevers, no emesis,  No chest pain, no diarrhea right lower extremity pain and swelling persist  Assessment  & Plan :    Principal Problem:   Diabetic ulcer of right foot due to type 2 diabetes mellitus ([place]) Active Problems:   Obstructive sleep apnea   History of pulmonary embolus (PE)   Hypertension   Obesity, Class III, BMI 40-49.9 (morbid obesity) ([place])  MRI Rt Foot IMPRESSION: 1. Open wound along the medial aspect of the ankle but no underlying abscess is identified. 2. Mild myositis involving the abductor hallucis muscle. 3. No MR findings worrisome for septic arthritis or osteomyelitis.  Plan:- 1)RLE open infected diabetic wound and cellulitis/Abscess--- MRI without septic arthritis or osteomyelitis, however suggest mild myositis of the abductor hallucis muscle , wound care specialist recommends conservative POC using xeroform gauze as an astringent and antimicrobial dressing, patient will get lower extremity ultrasound with ABI White count is down to 10.9 from 13.3, CRP is not elevated, lactic acid normalized after IV fluids, MRSA swab positive according to patient describe was actually obtained from the right lower extremity wound and not from the nasopharyngeal area, wound cultures negative so far... Continue IV Rocephin and vancomycin, plan will be to discharge home on possibly Bactrim and Keflex or doxycycline in order to inxclude some MRSA coverage  2)DM2-A1c 7.0 , reflecting overall good control, metformin on hold for now Use  Novolog/Humalog Sliding scale insulin with Accu-Cheks/Fingersticks as ordered   3)HTN--- stable, should Bactrim be initiated for #1 above patient will need to hold lisinopril to avoid AKI with hyperkalemia with that combination,   may use IV Hydralazine 10 mg  Every 4 hours Prn for systolic blood pressure over 160 mmhg  4)H/o DVT/PE--stable, continue Eliquis  5)Morbid Obesity/OSA----CPAP nightly advised, lifestyle and Diet modifications advised  Disposition/Need for in-Hospital Stay- patient unable to be discharged at this time due to presumed MRSA Rt Foot  Abscess/cellulitis requiring IV antibiotics and pending vascular studies of the right lower extremity  Code Status : Full   Disposition Plan  : home  Consults  :  Wound care   DVT Prophylaxis  : Eliquis  Lab Results  Component Value Date   PLT 229 03/22/2018    Inpatient Medications  Scheduled Meds: . apixaban  5 mg Oral BID  . insulin aspart  0-20 Units Subcutaneous TID WC  . lisinopril  2.5 mg Oral Daily  . multivitamin with minerals  1 tablet Oral Daily  . pantoprazole  40 mg Oral Daily   Continuous Infusions: . cefTRIAXone (ROCEPHIN)  IV 2 g (03/22/18 1150)  . vancomycin 1,000 mg (03/22/18 1304)   PRN Meds:.oxyCODONE    Anti-infectives (From admission, onward)   Start     Dose/Rate Route Frequency Ordered Stop   03/21/18 1400  vancomycin (  VANCOCIN) IVPB 1000 mg/200 mL premix     1,000 mg 200 mL/hr over 60 Minutes Intravenous Every 8 hours 03/21/18 0753     03/21/18 1400  metroNIDAZOLE (FLAGYL) tablet 500 mg  Status:  Discontinued     500 mg Oral Every 8 hours 03/21/18 1205 03/22/18 0824   03/21/18 0800  vancomycin (VANCOCIN) IVPB 1000 mg/200 mL premix  Status:  Discontinued     1,000 mg 200 mL/hr over 60 Minutes Intravenous  Once 03/21/18 0749 03/21/18 0750   03/21/18 0800  cefTRIAXone (ROCEPHIN) 2 g in sodium chloride 0.9 % 100 mL IVPB     2 g 200 mL/hr over 30 Minutes Intravenous Every 24 hours 03/21/18  0749     03/21/18 0800  vancomycin (VANCOCIN) 2,000 mg in sodium chloride 0.9 % 500 mL IVPB     2,000 mg 250 mL/hr over 120 Minutes Intravenous  Once 03/21/18 0750 03/21/18 1411        Objective:   Vitals:   03/21/18 1606 03/21/18 1646 03/21/18 2323 03/22/18 0629  BP: 135/72 (!) 154/65 117/72 (!) 103/59  Pulse: 77 79 83 80  Resp: 18 18 16 16   Temp: 98.2 F (36.8 C) 98.2 F (36.8 C) 98.4 F (36.9 C) 98.1 F (36.7 C)  TempSrc: Oral Oral Oral Oral  SpO2: 100% 100% 97% 94%  Weight:      Height:  6' 2.5" (1.892 m)      Wt Readings from Last 3 Encounters:  03/21/18 (!) 158.8 kg  02/09/18 (!) 161.2 kg  02/02/18 (!) 161.9 kg     Intake/Output Summary (Last 24 hours) at 03/22/2018 1351 Last data filed at 03/22/2018 1100 Gross per 24 hour  Intake 2049.63 ml  Output 1825 ml  Net 224.63 ml     Physical Exam Patient is examined daily including today on 03/22/18 , exams remain the same as of yesterday except that has changed   Gen:- Awake Alert, morbidly obese, in no acute distress HEENT:- [place].AT, No sclera icterus Neck-Supple Neck,No JVD,.  Lungs-  CTAB , good air movement CV- S1, S2 normal, regular Abd-  +ve B.Sounds, Abd Soft, No tenderness,    Extremity-good pulses Psych-affect is appropriate, oriented x3 Neuro-no new focal deficits, no tremors Right LE Foot and Rt LE skin Exam- Affected area measures 30cm x 24cm, Wound bed: partial thickness open areas on medial heel and posterior LEcellulitis with partial thickness wound on medial heel. Heel and lower LE are weeping light yellow serous exudate. LE is erythematous, edematous and warm to the touch as compared to LLE. White callus and maceration surrounds the heel injury    Data Review:   Micro Results Recent Results (from the past 240 hour(s))  Wound or Superficial Culture     Status: None (Preliminary result)   Collection Time: 03/21/18  7:48 AM  Result Value Ref Range Status   Specimen Description ANKLE RIGHT   Final   Special Requests Immunocompromised  Final   Gram Stain   Final    NO WBC SEEN NO ORGANISMS SEEN Performed at [facility], [address] [place], [place] [address]    Culture PENDING  Incomplete   Report Status PENDING  Incomplete  MRSA PCR Screening     Status: Abnormal   Collection Time: 03/21/18 12:05 PM  Result Value Ref Range Status   MRSA by PCR POSITIVE (A) NEGATIVE Final    Comment:        The GeneXpert MRSA Assay (FDA approved  for NASAL specimens only), is one component of a comprehensive MRSA colonization surveillance program. It is not intended to diagnose MRSA infection nor to guide or monitor treatment for MRSA infections. RESULT CALLED TO, READ BACK BY AND VERIFIED WITH: Bing Neighbors RN [phone]     Radiology Reports Dg Ankle Complete Right  Result Date: 03/21/2018 CLINICAL DATA:  57 year old male with right and ankle swelling and redness. Evaluate for cellulitis. EXAM: RIGHT ANKLE - COMPLETE 3+ VIEW COMPARISON:  Radiograph dated 02/02/2018 FINDINGS: There is no acute fracture or dislocation. There is a 1 cm bone spurring of the plantar calcaneus. There is diffuse subcutaneous edema. IMPRESSION: 1. No acute fracture or dislocation. 2. Diffuse subcutaneous edema, possibly cellulitis. Clinical correlation is recommended. Electronically Signed   By: Anner Crete M.D.   On: 03/21/2018 03:57   Mr Foot Right Wo Contrast  Result Date: 03/22/2018 CLINICAL DATA:  Chronic right foot ulcer with pain and swelling. EXAM: MRI OF THE RIGHT FOREFOOT WITHOUT CONTRAST TECHNIQUE: Multiplanar, multisequence MR imaging of the right ankle/hindfoot was performed. No intravenous contrast was administered. COMPARISON:  Radiographs 03/21/2018 FINDINGS: There is an open wound along the medial aspect of the ankle but no underlying abscess is identified. There is subcutaneous soft tissue swelling/edema/fluid suggesting local/focal cellulitis. No discrete subcutaneous  abscess. There is also mild edema involving the abductor hallucis muscle consistent with focal myositis. No MR findings suspicious for septic arthritis or osteomyelitis. Moderate tibiotalar and subtalar joint degenerative changes with small joint effusions. The medial and lateral ankle ligaments and tendons appear intact. The Achilles tendon and anterior ankle tendons are intact. The plantar fascia is intact. IMPRESSION: 1. Open wound along the medial aspect of the ankle but no underlying abscess is identified. 2. Mild myositis involving the abductor hallucis muscle. 3. No MR findings worrisome for septic arthritis or osteomyelitis. Electronically Signed   By: Marijo Sanes M.D.   On: 03/22/2018 11:41     CBC Recent Labs  Lab 03/21/18 0348 03/22/18 0259  WBC 13.3* 10.9*  HGB 12.9* 12.1*  HCT 42.2 39.8  PLT 261 229  MCV 88.1 87.9  MCH 26.9 26.7  MCHC 30.6 30.4  RDW 15.4 15.7*  LYMPHSABS 2.9 2.3  MONOABS 1.0 0.9  EOSABS 1.0* 0.8*  BASOSABS 0.1 0.1    Chemistries  Recent Labs  Lab 03/21/18 0348 03/22/18 0259  NA 139 136  K 4.0 4.1  CL 107 107  CO2 23 22  GLUCOSE 199* 140*  BUN 18 15  CREATININE 1.24 1.22  CALCIUM 9.1 8.6*  AST 19  --   ALT 17  --   ALKPHOS 52  --   BILITOT 0.6  --    ------------------------------------------------------------------------------------------------------------------ No results for input(s): CHOL, HDL, LDLCALC, TRIG, CHOLHDL, LDLDIRECT in the last 72 hours.  Lab Results  Component Value Date   HGBA1C 7.0 (H) 03/21/2018   ------------------------------------------------------------------------------------------------------------------ No results for input(s): TSH, T4TOTAL, T3FREE, THYROIDAB in the last 72 hours.  Invalid input(s): FREET3 ------------------------------------------------------------------------------------------------------------------ No results for input(s): VITAMINB12, FOLATE, FERRITIN, TIBC, IRON, RETICCTPCT in the last  72 hours.  Coagulation profile No results for input(s): INR, PROTIME in the last 168 hours.  No results for input(s): DDIMER in the last 72 hours.  Cardiac Enzymes No results for input(s): CKMB, TROPONINI, MYOGLOBIN in the last 168 hours.  Invalid input(s): CK ------------------------------------------------------------------------------------------------------------------ No results found for: BNP   Roxan Hockey M.D on 03/22/2018 at 1:51 PM  Pager---[phone] Go to [email or web address] - password [email or web address] for contact  info  [facility] - Office  [phone]

## 2018-03-22 NOTE — Progress Notes (Signed)
VASCULAR LAB PRELIMINARY  ARTERIAL  ABI completed:    RIGHT    LEFT    PRESSURE WAVEFORM  PRESSURE WAVEFORM  BRACHIAL 122 Triphasic BRACHIAL 129 Triphasic  DP 133 Triphasic DP 125 Triphasic  PT 148 Triphasic PT 143 Triphasic    RIGHT LEFT  ABI 1.15 1.11   BIS and Doppler waveforms were normal bilaterally at rest.  Makhiya Coburn, RVS 03/22/2018, 1:55 PM

## 2018-03-23 ENCOUNTER — Encounter (HOSPITAL_COMMUNITY): Payer: Self-pay | Admitting: General Practice

## 2018-03-23 LAB — AEROBIC CULTURE W GRAM STAIN (SUPERFICIAL SPECIMEN): Gram Stain: NONE SEEN

## 2018-03-23 LAB — CBC WITH DIFFERENTIAL/PLATELET
Abs Immature Granulocytes: 0.04 10*3/uL (ref 0.00–0.07)
Basophils Absolute: 0.1 10*3/uL (ref 0.0–0.1)
Basophils Relative: 1 %
Eosinophils Absolute: 0.6 10*3/uL — ABNORMAL HIGH (ref 0.0–0.5)
Eosinophils Relative: 5 %
HCT: 38.2 % — ABNORMAL LOW (ref 39.0–52.0)
Hemoglobin: 11.6 g/dL — ABNORMAL LOW (ref 13.0–17.0)
Immature Granulocytes: 0 %
Lymphocytes Relative: 21 %
Lymphs Abs: 2.2 10*3/uL (ref 0.7–4.0)
MCH: 26.5 pg (ref 26.0–34.0)
MCHC: 30.4 g/dL (ref 30.0–36.0)
MCV: 87.4 fL (ref 80.0–100.0)
Monocytes Absolute: 1 10*3/uL (ref 0.1–1.0)
Monocytes Relative: 9 %
Neutro Abs: 6.9 10*3/uL (ref 1.7–7.7)
Neutrophils Relative %: 64 %
Platelets: 223 10*3/uL (ref 150–400)
RBC: 4.37 MIL/uL (ref 4.22–5.81)
RDW: 15.3 % (ref 11.5–15.5)
WBC: 10.8 10*3/uL — ABNORMAL HIGH (ref 4.0–10.5)
nRBC: 0 % (ref 0.0–0.2)

## 2018-03-23 LAB — AEROBIC CULTURE  (SUPERFICIAL SPECIMEN)

## 2018-03-23 LAB — BASIC METABOLIC PANEL
Anion gap: 6 (ref 5–15)
BUN: 16 mg/dL (ref 6–20)
CO2: 24 mmol/L (ref 22–32)
Calcium: 8.5 mg/dL — ABNORMAL LOW (ref 8.9–10.3)
Chloride: 104 mmol/L (ref 98–111)
Creatinine, Ser: 1.19 mg/dL (ref 0.61–1.24)
GFR calc Af Amer: >60 mL/min (ref >60)
GFR calc non Af Amer: >60 mL/min (ref >60)
Glucose, Bld: 141 mg/dL — ABNORMAL HIGH (ref 70–99)
Potassium: 4.1 mmol/L (ref 3.5–5.1)
Sodium: 134 mmol/L — ABNORMAL LOW (ref 135–145)

## 2018-03-23 LAB — GLUCOSE, CAPILLARY
Glucose-Capillary: 152 mg/dL — ABNORMAL HIGH (ref 70–99)
Glucose-Capillary: 130 mg/dL — ABNORMAL HIGH (ref 70–99)
Glucose-Capillary: 128 mg/dL — ABNORMAL HIGH (ref 70–99)
Glucose-Capillary: 129 mg/dL — ABNORMAL HIGH (ref 70–99)

## 2018-03-23 MED ORDER — MUPIROCIN 2 % EX OINT
1.0000 "application " | TOPICAL_OINTMENT | Freq: Two times a day (BID) | CUTANEOUS | Status: DC
  Administered 2018-03-23 – 2018-03-24 (×3): 1 via NASAL
  Filled 2018-03-23 (×2): qty 22

## 2018-03-23 MED ORDER — CHLORHEXIDINE GLUCONATE CLOTH 2 % EX PADS
6.0000 | MEDICATED_PAD | Freq: Every day | CUTANEOUS | Status: DC
  Administered 2018-03-23 – 2018-03-24 (×2): 6 via TOPICAL

## 2018-03-23 MED ORDER — SODIUM CHLORIDE 0.9 % IV SOLN
INTRAVENOUS | Status: DC | PRN
  Administered 2018-03-23: 250 mL via INTRAVENOUS

## 2018-03-23 MED ORDER — VANCOMYCIN HCL 10 G IV SOLR
1250.0000 mg | Freq: Two times a day (BID) | INTRAVENOUS | Status: DC
  Filled 2018-03-23: qty 1250

## 2018-03-23 MED ORDER — VANCOMYCIN HCL 10 G IV SOLR
1250.0000 mg | Freq: Two times a day (BID) | INTRAVENOUS | Status: DC
  Administered 2018-03-23 – 2018-03-24 (×2): 1250 mg via INTRAVENOUS
  Filled 2018-03-23 (×3): qty 1250

## 2018-03-23 NOTE — Care Management Note (Addendum)
Case Management Note  Patient Details  Name: Calhoun Reichardt MRN: [id number] Date of Birth: [birth year]  Subjective/Objective:                    Action/Plan:   Confirmed face sheet information. Patient lives alone has had home health before but cannot remember name of agency. Patient aware he will be taught how to change dressing because home health nurse will not be there every day. Patient has sister Butch Penny [phone] who can assist at home and will provide transportation home. Will need MD signed home health orders and face to face. [facility] unable to take patient.Checking on other home health agencies in network with [employer]. Patient has no preference.   [facility] has accepted referral. Expected Discharge Date:                  Expected Discharge Plan:  Home/Self Care  In-House Referral:     Discharge planning Services  CM Consult  Post Acute Care Choice:  Home Health Choice offered to:     DME Arranged:    DME Agency:     HH Arranged:   RN,PT [place] Agency:   [facility]  Status of Service:  In process, will continue to follow  If discussed at Long Length of Stay Meetings, dates discussed:    Additional Comments:  Marilu Favre, RN 03/23/2018, 12:02 PM

## 2018-03-23 NOTE — Progress Notes (Signed)
Pt places self on/off cpap when ready.  RT will monitor as needed.

## 2018-03-23 NOTE — Progress Notes (Signed)
Pharmacy Antibiotic Note  Evan Leonard is a 57 y.o. male admitted on 03/21/2018 with RLE cellulitis.  Pharmacy has been consulted for Vancomycin  dosing.  On abx for DFI. MRI showed no underlying abscess or osteo. SCr 1.19, normalized CrCl ~32ml/min  Plan: Change vancomycin to 1,250mg  IV Q12h Continue CTX 2gm q24 Monitor clinical picture, renal function, VT prn F/U C&S, abx deescalation / LOT  Height: 6' 2.5" (189.2 cm) Weight: (!) 350 lb (158.8 kg) IBW/kg (Calculated) : 83.35  Temp (24hrs), Avg:99.5 F (37.5 C), Min:98.4 F (36.9 C), Max:100.2 F (37.9 C)  Recent Labs  Lab 03/21/18 0348 03/21/18 0357 03/21/18 0816 03/21/18 1306 03/22/18 0259 03/23/18 0240  WBC 13.3*  --   --   --  10.9* 10.8*  CREATININE 1.24  --   --   --  1.22 1.19  LATICACIDVEN  --  1.91* 2.10* 1.3  --   --     Estimated Creatinine Clearance: 108.7 mL/min (by C-G formula based on SCr of 1.19 mg/dL).    No Known Allergies  Reginia Naas 03/23/2018 10:43 AM

## 2018-03-23 NOTE — Discharge Instructions (Addendum)
1) right lower extremity/leg dressing changes once to twice a day as advised may use Xeroform gauze as an astringent and antimicrobial (bactroban ointment) dressing.... May use heel boot for pressure injury prevention. Home health nurse may apply Louretta Parma boots/compression dressings once to twice a week    2) follow-up with your primary care doctor on Friday, 03/27/2018 for wound recheck  3) keep wound elevated, avoid salt in your diet  4) take doxycycline antibiotic as prescribed for MRSA infection of your right leg  5) you are already taking apixaban/blood thinner so Avoid ibuprofen/Advil/Aleve/Motrin/Goody Powders/Naproxen/BC powders/Meloxicam/Diclofenac/Indomethacin and other Nonsteroidal anti-inflammatory medications as these will make you more likely to bleed and can cause stomach ulcers, can also cause Kidney problems.    Information on my medicine - ELIQUIS (apixaban)  Why was Eliquis prescribed for you? Eliquis was prescribed for you due to a history of blood clots  What do You need to know about Eliquis ? Take your Eliquis TWICE DAILY - one tablet in the morning and one tablet in the evening with or without food.  It would be best to take the doses about the same time each day.  If you have difficulty swallowing the tablet whole please discuss with your pharmacist how to take the medication safely.  Take Eliquis exactly as prescribed by your doctor and DO NOT stop taking Eliquis without talking to the doctor who prescribed the medication.  Stopping may increase your risk of developing a new clot or stroke.  Refill your prescription before you run out.  After discharge, you should have regular check-up appointments with your healthcare provider that is prescribing your Eliquis.  In the future your dose may need to be changed if your kidney function or weight changes by a significant amount or as you get older.  What do you do if you miss a dose? If you miss a dose, take it as  soon as you remember on the same day and resume taking twice daily.  Do not take more than one dose of ELIQUIS at the same time.  Important Safety Information A possible side effect of Eliquis is bleeding. You should call your healthcare provider right away if you experience any of the following: ? Bleeding from an injury or your nose that does not stop. ? Unusual colored urine (red or dark brown) or unusual colored stools (red or black). ? Unusual bruising for unknown reasons. ? A serious fall or if you hit your head (even if there is no bleeding).  Some medicines may interact with Eliquis and might increase your risk of bleeding or clotting while on Eliquis. To help avoid this, consult your healthcare provider or pharmacist prior to using any new prescription or non-prescription medications, including herbals, vitamins, non-steroidal anti-inflammatory drugs (NSAIDs) and supplements.  This website has more information on Eliquis (apixaban): [email or web address].   1) right lower extremity/leg dressing changes once to twice a day as advised may use Xeroform gauze as an astringent and antimicrobial (bactroban ointment) dressing.... May use heel boot for pressure injury prevention. Home health nurse may apply Louretta Parma boots/compression dressings once to twice a week    2) follow-up with your primary care doctor on Friday, 03/27/2018 for wound recheck  3) keep wound elevated, avoid salt in your diet  4) take doxycycline antibiotic as prescribed for MRSA infection of your right leg  5) you are already taking apixaban/blood thinner so Avoid ibuprofen/Advil/Aleve/Motrin/Goody Powders/Naproxen/BC powders/Meloxicam/Diclofenac/Indomethacin and other Nonsteroidal anti-inflammatory medications as these will make you more likely  to bleed and can cause stomach ulcers, can also cause Kidney problems.

## 2018-03-23 NOTE — Social Work (Signed)
CSW acknowledging consult for access to medications at discharge.  For medication access please consult RN Case Management.   CSW signing off. Please consult if any additional needs arise.  Hasheem Voland H Jolyne Laye, LCSWA [place] Clinical Social Work [phone]   

## 2018-03-23 NOTE — Progress Notes (Signed)
Initial Nutrition Assessment  DOCUMENTATION CODES:   Morbid obesity  INTERVENTION:   - Add ProStat TID (each provides 100 kcal and 15 g protein) - Continue MVI with minerals   NUTRITION DIAGNOSIS:   Increased nutrient needs related to wound healing as evidenced by estimated needs.   GOAL:   Patient will meet greater than or equal to 90% of their needs   MONITOR:   PO intake, Supplement acceptance, Skin  REASON FOR ASSESSMENT:   Consult Wound healing  ASSESSMENT:   57 yo male, admitted with diabetic ulcer on R foot. PMH significant for T2DM, chronic R foot ulcer, DVT, HTN, obesity, OSA on CPAP, peripheral vascular disease, pyelonephritis, small bowel obstruction.  Labs: sodium 134, glucose 141, Hgb 11.6, Hct 38.2 Meds: novolog, MVI with minerals, protonix  Pt resting at time of visit. States his appetite is good and normal. Typically eats 2 meals/day and snacks if hungry. Reports watching portion sizes, but does not follow any special diet at home. Takes MVI (Centrum 50+).  UBW 350# with no recent wt loss.   Denies nausea or vomiting, diarrhea or constipation, or difficulty chewing or swallowing.  Encouraged pt to include protein foods with all meals and snacks to promote wound healing. Pt amenable to trying ProStat TID.  NUTRITION - FOCUSED PHYSICAL EXAM:   Most Recent Value  Orbital Region  No depletion  Upper Arm Region  No depletion  Thoracic and Lumbar Region  No depletion  Buccal Region  No depletion  Temple Region  No depletion  Clavicle Bone Region  No depletion  Clavicle and Acromion Bone Region  No depletion  Scapular Bone Region  No depletion  Dorsal Hand  No depletion  Patellar Region  No depletion  Anterior Thigh Region  No depletion  Posterior Calf Region  No depletion  Edema (RD Assessment)  None  Hair  Reviewed  Eyes  Reviewed  Mouth  Reviewed  Skin  Reviewed  Nails  Reviewed      Diet Order:  100% intake of last 2 meals documented,  per nsg Diet Order            Diet heart healthy/carb modified Room service appropriate? Yes; Fluid consistency: Thin  Diet effective now              EDUCATION NEEDS:  No education needs have been identified at this time  Skin:  Skin Assessment: Skin Integrity Issues: Skin Integrity Issues:: Diabetic Ulcer Diabetic Ulcer: R foot  Last BM:  PTA  Height:  Ht Readings from Last 1 Encounters:  03/21/18 6' 2.5" (1.892 m)    Weight:  Wt Readings from Last 1 Encounters:  03/21/18 (!) 158.8 kg    Ideal Body Weight:  86.4 kg  BMI:  Body mass index is 44.34 kg/m.  Estimated Nutritional Needs:   Kcal:  6333-5456 kcal/day(11-14 kcal/kg ABW)  Protein:  128 g protein/day(2.5 g/kg IBW )  Fluid:  1 mL/kcal or per MD discretion  Althea Grimmer, MS, RDN, LDN On-call pager: [phone]

## 2018-03-23 NOTE — Progress Notes (Signed)
Inpatient Diabetes Program Recommendations  AACE/ADA: New Consensus Statement on Inpatient Glycemic Control (2019)  Target Ranges:  Prepandial:   less than 140 mg/dL      Peak postprandial:   less than 180 mg/dL (1-2 hours)      Critically ill patients:  140 - 180 mg/dL   Results for Evan Leonard, Evan Leonard (MRN [id number]) as of 03/23/2018 07:53  Ref. Range 03/21/2018 12:00 03/21/2018 16:33 03/21/2018 23:18 03/22/2018 08:26 03/22/2018 11:57 03/22/2018 17:04 03/22/2018 21:00  Glucose-Capillary Latest Ref Range: 70 - 99 mg/dL 119 (H) 125 (H) 118 (H) 139 (H) 131 (H) 144 (H) 94   Results for Evan Leonard, Evan Leonard (MRN [id number]) as of 03/23/2018 07:53  Ref. Range 03/21/2018 03:48  Hemoglobin A1C Latest Ref Range: 4.8 - 5.6 % 7.0 (H)   Review of Glycemic Control  Diabetes history: DM2 Outpatient Diabetes medications: Metformin 1000 mg BID Current orders for Inpatient glycemic control: Novolog 0-20 units TID with meals  Inpatient Diabetes Program Recommendations: HgbA1C: A1C 7% on 03/21/18 indicating an average glucose of 154 mg/dl over the past 2-3 months.  NOTE: Noted consult for Diabetes Coordinator per Diabetic Foot Ulcer order set. Chart reviewed. CBGs trending well and A1C 7%. Agree with current inpatient DM orders. Will follow.  Thanks, Barnie Alderman, RN, MSN, CDE Diabetes Coordinator Inpatient Diabetes Program [phone] (Team Pager from 8am to 5pm)

## 2018-03-23 NOTE — Progress Notes (Signed)
Patient Demographics:    Evan Leonard, is a 57 y.o. male, DOB - 11/04/1960, ENI:[id number]  Admit date - 03/21/2018   Admitting Physician Reubin Milan, MD  Outpatient Primary MD for the patient is [name], MD  LOS - 2   Chief Complaint  Patient presents with  . Ankle Pain    r/o cellulitis        Subjective:    Evan Leonard today has no fevers, no emesis,  No chest pain, no diarrhea , right lower extremity weeping/drainage persist, patient trying to elevate it,  Assessment  & Plan :    Principal Problem:   Diabetic ulcer of right foot due to type 2 diabetes mellitus ([place]) Active Problems:   Obstructive sleep apnea   History of pulmonary embolus (PE)   Hypertension   Obesity, Class III, BMI 40-49.9 (morbid obesity) ([place])  MRI Rt Foot IMPRESSION: 1. Open wound along the medial aspect of the ankle but no underlying abscess is identified. 2. Mild myositis involving the abductor hallucis muscle. 3. No MR findings worrisome for septic arthritis or osteomyelitis.  Plan:- 1)RLE open infected diabetic wound and cellulitis/Abscess--- MRI without septic arthritis or osteomyelitis, however suggest mild myositis of the abductor hallucis muscle , wound care specialist recommends conservative POC using xeroform gauze as an astringent and antimicrobial dressing, Bil lower extremity ultrasound with ABI without significant abnormality, WBC is 10.8  down  from 13.3, CRP is not elevated, lactic acid normalized after IV fluids, wound culture from 03/21/2018 with MRSA , .. Continue IV Vancomycin, stop IV Rocephin,  plan will be to discharge home on possibly Bactrim and Keflex or doxycycline monotherapy in order to include   MRSA coverage  2)DM2-A1c 7.0 , reflecting overall good control, metformin on hold for now Use Novolog/Humalog Sliding scale insulin with Accu-Cheks/Fingersticks as ordered     3)HTN--- stable, should Bactrim be initiated for #1 above patient will need to hold lisinopril to avoid AKI with hyperkalemia with that combination,   may use IV Hydralazine 10 mg  Every 4 hours Prn for systolic blood pressure over 160 mmhg  4)H/o DVT/PE--stable, continue Eliquis  5)Morbid Obesity/OSA----CPAP nightly advised, lifestyle and Diet modifications advised  Disposition/Need for in-Hospital Stay- patient unable to be discharged at this time due to confirmed MRSA Rt Foot  Abscess/cellulitis requiring IV antibiotics   Code Status : Full  Disposition Plan  : home  Consults  :  Wound care   DVT Prophylaxis  : Eliquis  Lab Results  Component Value Date   PLT 223 03/23/2018    Inpatient Medications  Scheduled Meds: . apixaban  5 mg Oral BID  . Chlorhexidine Gluconate Cloth  6 each Topical Q0600  . insulin aspart  0-20 Units Subcutaneous TID WC  . lisinopril  2.5 mg Oral Daily  . multivitamin with minerals  1 tablet Oral Daily  . mupirocin ointment  1 application Nasal BID  . pantoprazole  40 mg Oral Daily   Continuous Infusions: . sodium chloride 10 mL/hr at 03/23/18 0634  . cefTRIAXone (ROCEPHIN)  IV 2 g (03/23/18 0846)  . vancomycin     PRN Meds:.sodium chloride, hydrALAZINE, oxyCODONE    Anti-infectives (From admission, onward)   Start  Dose/Rate Route Frequency Ordered Stop   03/23/18 2100  vancomycin (VANCOCIN) 1,250 mg in sodium chloride 0.9 % 250 mL IVPB     1,250 mg 166.7 mL/hr over 90 Minutes Intravenous Every 12 hours 03/23/18 1327     03/23/18 1200  vancomycin (VANCOCIN) 1,250 mg in sodium chloride 0.9 % 250 mL IVPB  Status:  Discontinued     1,250 mg 166.7 mL/hr over 90 Minutes Intravenous Every 12 hours 03/23/18 1042 03/23/18 1327   03/21/18 1400  vancomycin (VANCOCIN) IVPB 1000 mg/200 mL premix  Status:  Discontinued     1,000 mg 200 mL/hr over 60 Minutes Intravenous Every 8 hours 03/21/18 0753 03/23/18 1042   03/21/18 1400  metroNIDAZOLE  (FLAGYL) tablet 500 mg  Status:  Discontinued     500 mg Oral Every 8 hours 03/21/18 1205 03/22/18 0824   03/21/18 0800  vancomycin (VANCOCIN) IVPB 1000 mg/200 mL premix  Status:  Discontinued     1,000 mg 200 mL/hr over 60 Minutes Intravenous  Once 03/21/18 0749 03/21/18 0750   03/21/18 0800  cefTRIAXone (ROCEPHIN) 2 g in sodium chloride 0.9 % 100 mL IVPB     2 g 200 mL/hr over 30 Minutes Intravenous Every 24 hours 03/21/18 0749     03/21/18 0800  vancomycin (VANCOCIN) 2,000 mg in sodium chloride 0.9 % 500 mL IVPB     2,000 mg 250 mL/hr over 120 Minutes Intravenous  Once 03/21/18 0750 03/21/18 1411        Objective:   Vitals:   03/22/18 1407 03/22/18 2102 03/22/18 2323 03/23/18 0425  BP: 117/69 118/89  125/66  Pulse: 83 87 85 87  Resp: 16 16 18 18   Temp: 98.4 F (36.9 C) 100.2 F (37.9 C)  99.8 F (37.7 C)  TempSrc: Oral Oral  Oral  SpO2: 95% 98% 97% 98%  Weight:      Height:        Wt Readings from Last 3 Encounters:  03/21/18 (!) 158.8 kg  02/09/18 (!) 161.2 kg  02/02/18 (!) 161.9 kg     Intake/Output Summary (Last 24 hours) at 03/23/2018 1414 Last data filed at 03/23/2018 1256 Gross per 24 hour  Intake 1211.08 ml  Output 2200 ml  Net -988.92 ml     Physical Exam Patient is examined daily including today on 03/23/18 , exams remain the same as of yesterday except that has changed   Gen:- Awake Alert, morbidly obese, in no acute distress HEENT:- [place].AT, No sclera icterus Neck-Supple Neck,No JVD,.  Lungs-  CTAB , good air movement CV- S1, S2 normal, regular Abd-  +ve B.Sounds, Abd Soft, No tenderness,    Extremity-good pulses Psych-affect is appropriate, oriented x3 Neuro-no new focal deficits, no tremors Right LE Foot and Rt LE skin Exam- Affected area measures 30cm x 24cm, Wound bed: partial thickness open areas on medial heel and posterior LEcellulitis with partial thickness wound on medial heel. Heel and lower LE are weeping light yellow serous exudate.  LE is erythematous, edematous and warm to the touch as compared to LLE. White callus and maceration surrounds the heel injury    Data Review:   Micro Results Recent Results (from the past 240 hour(s))  Wound or Superficial Culture     Status: None   Collection Time: 03/21/18  7:48 AM  Result Value Ref Range Status   Specimen Description ANKLE RIGHT  Final   Special Requests Immunocompromised  Final   Gram Stain   Final    NO  WBC SEEN NO ORGANISMS SEEN Performed at [facility], [place] [address], [place], [place] [address]    Culture   Final    MODERATE METHICILLIN RESISTANT STAPHYLOCOCCUS AUREUS   Report Status 03/23/2018 FINAL  Final   Organism ID, Bacteria METHICILLIN RESISTANT STAPHYLOCOCCUS AUREUS  Final      Susceptibility   Methicillin resistant staphylococcus aureus - MIC*    CIPROFLOXACIN <=0.5 SENSITIVE Sensitive     ERYTHROMYCIN >=8 RESISTANT Resistant     GENTAMICIN <=0.5 SENSITIVE Sensitive     OXACILLIN >=4 RESISTANT Resistant     TETRACYCLINE <=1 SENSITIVE Sensitive     VANCOMYCIN <=0.5 SENSITIVE Sensitive     TRIMETH/SULFA <=10 SENSITIVE Sensitive     CLINDAMYCIN <=0.25 SENSITIVE Sensitive     RIFAMPIN <=0.5 SENSITIVE Sensitive     Inducible Clindamycin NEGATIVE Sensitive     * MODERATE METHICILLIN RESISTANT STAPHYLOCOCCUS AUREUS  Blood Culture (routine x 2)     Status: None (Preliminary result)   Collection Time: 03/21/18  8:29 AM  Result Value Ref Range Status   Specimen Description BLOOD RIGHT ARM  Final   Special Requests   Final    BOTTLES DRAWN AEROBIC AND ANAEROBIC Blood Culture adequate volume   Culture   Final    NO GROWTH 1 DAY Performed at [facility], [address], [place], [place] [address]    Report Status PENDING  Incomplete  Blood Culture (routine x 2)     Status: None (Preliminary result)   Collection Time: 03/21/18  8:31 AM  Result Value Ref Range Status   Specimen Description BLOOD LEFT HAND  Final   Special Requests    Final    BOTTLES DRAWN AEROBIC AND ANAEROBIC Blood Culture results may not be optimal due to an inadequate volume of blood received in culture bottles   Culture   Final    NO GROWTH 1 DAY Performed at [facility], [place] [address], [place], [place] [address]    Report Status PENDING  Incomplete  MRSA PCR Screening     Status: Abnormal   Collection Time: 03/21/18 12:05 PM  Result Value Ref Range Status   MRSA by PCR POSITIVE (A) NEGATIVE Final    Comment:        The GeneXpert MRSA Assay (FDA approved for NASAL specimens only), is one component of a comprehensive MRSA colonization surveillance program. It is not intended to diagnose MRSA infection nor to guide or monitor treatment for MRSA infections. RESULT CALLED TO, READ BACK BY AND VERIFIED WITH: Bing Neighbors RN [phone]     Radiology Reports Dg Ankle Complete Right  Result Date: 03/21/2018 CLINICAL DATA:  57 year old male with right and ankle swelling and redness. Evaluate for cellulitis. EXAM: RIGHT ANKLE - COMPLETE 3+ VIEW COMPARISON:  Radiograph dated 02/02/2018 FINDINGS: There is no acute fracture or dislocation. There is a 1 cm bone spurring of the plantar calcaneus. There is diffuse subcutaneous edema. IMPRESSION: 1. No acute fracture or dislocation. 2. Diffuse subcutaneous edema, possibly cellulitis. Clinical correlation is recommended. Electronically Signed   By: Anner Crete M.D.   On: 03/21/2018 03:57   Mr Foot Right Wo Contrast  Result Date: 03/22/2018 CLINICAL DATA:  Chronic right foot ulcer with pain and swelling. EXAM: MRI OF THE RIGHT FOREFOOT WITHOUT CONTRAST TECHNIQUE: Multiplanar, multisequence MR imaging of the right ankle/hindfoot was performed. No intravenous contrast was administered. COMPARISON:  Radiographs 03/21/2018 FINDINGS: There is an open wound along the medial  aspect of the ankle but no underlying abscess is identified. There is subcutaneous soft tissue swelling/edema/fluid suggesting  local/focal cellulitis. No discrete subcutaneous abscess. There is also mild edema involving the abductor hallucis muscle consistent with focal myositis. No MR findings suspicious for septic arthritis or osteomyelitis. Moderate tibiotalar and subtalar joint degenerative changes with small joint effusions. The medial and lateral ankle ligaments and tendons appear intact. The Achilles tendon and anterior ankle tendons are intact. The plantar fascia is intact. IMPRESSION: 1. Open wound along the medial aspect of the ankle but no underlying abscess is identified. 2. Mild myositis involving the abductor hallucis muscle. 3. No MR findings worrisome for septic arthritis or osteomyelitis. Electronically Signed   By: Marijo Sanes M.D.   On: 03/22/2018 11:41     CBC Recent Labs  Lab 03/21/18 0348 03/22/18 0259 03/23/18 0240  WBC 13.3* 10.9* 10.8*  HGB 12.9* 12.1* 11.6*  HCT 42.2 39.8 38.2*  PLT 261 229 223  MCV 88.1 87.9 87.4  MCH 26.9 26.7 26.5  MCHC 30.6 30.4 30.4  RDW 15.4 15.7* 15.3  LYMPHSABS 2.9 2.3 2.2  MONOABS 1.0 0.9 1.0  EOSABS 1.0* 0.8* 0.6*  BASOSABS 0.1 0.1 0.1    Chemistries  Recent Labs  Lab 03/21/18 0348 03/22/18 0259 03/23/18 0240  NA 139 136 134*  K 4.0 4.1 4.1  CL 107 107 104  CO2 23 22 24   GLUCOSE 199* 140* 141*  BUN 18 15 16   CREATININE 1.24 1.22 1.19  CALCIUM 9.1 8.6* 8.5*  AST 19  --   --   ALT 17  --   --   ALKPHOS 52  --   --   BILITOT 0.6  --   --    ------------------------------------------------------------------------------------------------------------------ No results for input(s): CHOL, HDL, LDLCALC, TRIG, CHOLHDL, LDLDIRECT in the last 72 hours.  Lab Results  Component Value Date   HGBA1C 7.0 (H) 03/21/2018   ------------------------------------------------------------------------------------------------------------------ No results for input(s): TSH, T4TOTAL, T3FREE, THYROIDAB in the last 72 hours.  Invalid input(s):  FREET3 ------------------------------------------------------------------------------------------------------------------ No results for input(s): VITAMINB12, FOLATE, FERRITIN, TIBC, IRON, RETICCTPCT in the last 72 hours.  Coagulation profile No results for input(s): INR, PROTIME in the last 168 hours.  No results for input(s): DDIMER in the last 72 hours.  Cardiac Enzymes No results for input(s): CKMB, TROPONINI, MYOGLOBIN in the last 168 hours.  Invalid input(s): CK ------------------------------------------------------------------------------------------------------------------ No results found for: BNP   Roxan Hockey M.D on 03/23/2018 at 2:14 PM  Pager---[phone] Go to [email or web address] - password [email or web address] for contact info  [facility] - Office  [phone]

## 2018-03-24 LAB — CBC WITH DIFFERENTIAL/PLATELET
Abs Immature Granulocytes: 0.05 10*3/uL (ref 0.00–0.07)
Basophils Absolute: 0.1 10*3/uL (ref 0.0–0.1)
Basophils Relative: 0 %
Eosinophils Absolute: 0.3 10*3/uL (ref 0.0–0.5)
Eosinophils Relative: 3 %
HCT: 39 % (ref 39.0–52.0)
Hemoglobin: 12.1 g/dL — ABNORMAL LOW (ref 13.0–17.0)
Immature Granulocytes: 0 %
Lymphocytes Relative: 21 %
Lymphs Abs: 2.5 10*3/uL (ref 0.7–4.0)
MCH: 26.8 pg (ref 26.0–34.0)
MCHC: 31 g/dL (ref 30.0–36.0)
MCV: 86.5 fL (ref 80.0–100.0)
Monocytes Absolute: 1.4 10*3/uL — ABNORMAL HIGH (ref 0.1–1.0)
Monocytes Relative: 12 %
Neutro Abs: 7.3 10*3/uL (ref 1.7–7.7)
Neutrophils Relative %: 64 %
Platelets: 247 10*3/uL (ref 150–400)
RBC: 4.51 MIL/uL (ref 4.22–5.81)
RDW: 15.3 % (ref 11.5–15.5)
WBC: 11.6 10*3/uL — ABNORMAL HIGH (ref 4.0–10.5)
nRBC: 0 % (ref 0.0–0.2)

## 2018-03-24 LAB — BASIC METABOLIC PANEL
Anion gap: 9 (ref 5–15)
BUN: 17 mg/dL (ref 6–20)
CO2: 22 mmol/L (ref 22–32)
Calcium: 8.6 mg/dL — ABNORMAL LOW (ref 8.9–10.3)
Chloride: 103 mmol/L (ref 98–111)
Creatinine, Ser: 1.21 mg/dL (ref 0.61–1.24)
GFR calc Af Amer: >60 mL/min (ref >60)
GFR calc non Af Amer: >60 mL/min (ref >60)
Glucose, Bld: 130 mg/dL — ABNORMAL HIGH (ref 70–99)
Potassium: 4 mmol/L (ref 3.5–5.1)
Sodium: 134 mmol/L — ABNORMAL LOW (ref 135–145)

## 2018-03-24 LAB — GLUCOSE, CAPILLARY
Glucose-Capillary: 147 mg/dL — ABNORMAL HIGH (ref 70–99)
Glucose-Capillary: 116 mg/dL — ABNORMAL HIGH (ref 70–99)
Glucose-Capillary: 135 mg/dL — ABNORMAL HIGH (ref 70–99)

## 2018-03-24 MED ORDER — MUPIROCIN 2 % EX OINT: 1.0000 "application " | TOPICAL_OINTMENT | Freq: Two times a day (BID) | CUTANEOUS | 0 refills | Status: DC

## 2018-03-24 MED ORDER — DOXYCYCLINE HYCLATE 100 MG PO TABS: 100.0000 mg | ORAL_TABLET | Freq: Two times a day (BID) | ORAL | 0 refills | Status: AC

## 2018-03-24 MED ORDER — DOXYCYCLINE HYCLATE 100 MG PO TABS
100.0000 mg | ORAL_TABLET | Freq: Once | ORAL | Status: AC
  Administered 2018-03-24: 100 mg via ORAL
  Filled 2018-03-24: qty 1

## 2018-03-24 MED ORDER — OXYCODONE HCL 5 MG PO TABS: 5.0000 mg | ORAL_TABLET | ORAL | 0 refills | Status: DC | PRN

## 2018-03-24 MED ORDER — DOXYCYCLINE HYCLATE 100 MG PO TABS: 100.0000 mg | ORAL_TABLET | Freq: Two times a day (BID) | ORAL | 0 refills | Status: DC

## 2018-03-24 MED FILL — DOXYCYCLINE HYCLATE 100 MG: 100 | 10 days supply | Qty: 20 | Fill #0

## 2018-03-24 NOTE — Discharge Summary (Signed)
Evan Leonard, is a 57 y.o. male  DOB 1960-11-20  MRN [id number].  Admission date:  03/21/2018  Admitting Physician  Reubin Milan, MD  Discharge Date:  03/24/2018   Primary MD  [name], MD  Recommendations for primary care physician for things to follow:   1) right lower extremity/leg dressing changes once to twice a day as advised may use Xeroform gauze as an astringent and antimicrobial (bactroban ointment) dressing.... May use heel boot for pressure injury prevention. Home health nurse may apply Louretta Parma boots/compression dressings once to twice a week    2) follow-up with your primary care doctor on Friday, 03/27/2018 for wound recheck  3) keep wound elevated, avoid salt in your diet  4) take doxycycline antibiotic as prescribed for MRSA infection of your right leg  5) you are already taking apixaban/blood thinner so Avoid ibuprofen/Advil/Aleve/Motrin/Goody Powders/Naproxen/BC powders/Meloxicam/Diclofenac/Indomethacin and other Nonsteroidal anti-inflammatory medications as these will make you more likely to bleed and can cause stomach ulcers, can also cause Kidney problems.    Admission Diagnosis  Cellulitis of right lower extremity [L03.115]   Discharge Diagnosis  Cellulitis of right lower extremity [L03.115]    Principal Problem:   Diabetic ulcer of right foot due to type 2 diabetes mellitus ([facility]) Active Problems:   Obstructive sleep apnea   History of pulmonary embolus (PE)   Hypertension   Obesity, Class III, BMI 40-49.9 (morbid obesity) ([place])      Past Medical History:  Diagnosis Date  . Diabetes mellitus, type II ([facility]) 02/2013  . Diabetic ulcer of right foot due to type 2 diabetes mellitus ([facility]) 03/21/2018  . DVT (deep venous thrombosis) ([facility]) 05/04/2012   LLE  . Exertional dyspnea 05/04/2012   "isolated episode" (05/05/2012)  . Hepatic steatosis   . History of  chickenpox   . Hypertension 03/21/2018  . Obesity   . Obesity, Class III, BMI 40-49.9 (morbid obesity) ([facility]) 03/21/2018  . OSA on CPAP   . Peripheral vascular disease ([facility])    varicose veins  . Pulmonary embolism ([facility]) 05/04/2012   bilaterally  . Pyelonephritis 04/2017  . Small bowel obstruction ([facility]) 09/06/2015  . Varicose veins     Past Surgical History:  Procedure Laterality Date  . CYSTOSCOPY W/ URETERAL STENT PLACEMENT Right 05/12/2017   Procedure: CYSTOSCOPY WITH RETROGRADE PYELOGRAM/URETERAL STENT PLACEMENT;  Surgeon: [name], MD;  Location: [facility];  Service: Urology;  Laterality: Right;  . CYSTOSCOPY/URETEROSCOPY/HOLMIUM LASER/STENT PLACEMENT Right 07/29/2017   Procedure: CYSTOSCOPY RIGHT URETEROSCOPY HOLMIUM LASER STENT EXCHANGE;  Surgeon: [name], MD;  Location: [facility];  Service: Urology;  Laterality: Right;  . HERNIA REPAIR     2015  . LAPAROTOMY N/A 12/13/2013   Procedure: EXPLORATORY LAPAROTOMY Repair ventral hernia, without mesh, partial omentectomy;  Surgeon: Gwenyth Ober, MD;  Location: [facility];  Service: General;  Laterality: N/A;  . VENTRAL HERNIA REPAIR N/A 04/26/2017   Procedure: INCARCERATED HERNIA REPAIR VENTRAL ADULT;  Surgeon: Georganna Skeans, MD;  Location: [facility];  Service: General;  Laterality:  N/A;       HPI  from the history and physical done on the day of admission:   Chief Complaint: Right foot infection.  HPI: Evan Leonard is a 57 y.o. male with medical history significant of type 2 diabetes, chronic right foot ulcer, history of DVT, history of PE, history of herpes zoster, hypertension, obesity, OSA on CPAP, peripheral vascular disease, history of pyelonephritis, history of small bowel obstruction, history of varicose veins who is coming to the emergency department with complaints of right ankle edema, tenderness, erythema associated with serous purulent discharge.  He denies fever, chills, but feels tired.  No headache, sore throat, rhinorrhea,  wheezing or hemoptysis.  He gets dyspneic easily on exertion, but denies chest pain, palpitations, dizziness or diaphoresis.  No abdominal pain, nausea, emesis, diarrhea or constipation.  No melena or hematochezia.  Denies dysuria, frequency or hematuria.  Denies polyuria, polydipsia, polyphagia or blurred vision.  However states that his capillary blood glucose has been elevated in the high 200s and low 300s since he developed symptoms on his ankle.  ED Course:  Initial vital signs were temperature 98.5 F, pulse 113, respiration 20, blood pressure 120/81 mmHg and O2 sat 98% on room air.  He received IV fluids, vancomycin, ceftriaxone and Flagyl.  His urinalysis was normal.  White count was 13.3, hemoglobin 12.9 g/dL and platelets 261.  Lactic acid was 1.91 and then 2.10.  His CMP showed a glucose of 199 and an albumin of 3.4 g/dL, but was otherwise normal.  Right ankle x-ray shows soft tissue swelling, but no radiographic evidence of hold furosemide for now osteomyelitis.     Hospital Course:    MRI Rt Foot IMPRESSION: 1. Open wound along the medial aspect of the ankle but no underlying abscess is identified. 2. Mild myositis involving the abductor hallucis muscle. 3. No MR findings worrisome for septic arthritis or osteomyelitis.  Plan:- 1)RLE open infected diabetic wound and cellulitis/Abscess--- MRI without septic arthritis or osteomyelitis, however suggest mild myositis of the abductor hallucis muscle , wound care specialist recommends conservative POC using xeroform gauze as an astringent and antimicrobial dressing, Bil lower extremity ultrasound with ABI without significant abnormality, WBC is 11 down  from 13.3, CRP is not elevated, lactic acid normalized after IV fluids, wound culture from 03/21/2018 with MRSA , .. Treated with IV Vancomycin and  IV Rocephin,   discharge home on doxycycline in order to include  MRSA coverage per sensitivity report  2)DM2-A1c 7.0 , reflecting overall  good control, okay to restart metformin   3)HTN--- stable, continue lisinopril    4)H/o DVT/PE--stable, continue Eliquis  5)Morbid Obesity/OSA----CPAP nightly advised, lifestyle and Diet modifications advised  Code Status : Full  Disposition Plan  : home  Consults  :  Wound care  DVT Prophylaxis  : Eliquis  Discharge Condition: stable  Follow UP  Follow-up Information    [facility]   Specialty:  Home Health Services Why:  Provide home health Contact information: [address] [place] [address] [phone]           Diet and Activity recommendation:  As advised  Discharge Instructions    Discharge Instructions    Call MD for:  difficulty breathing, headache or visual disturbances   Complete by:  As directed    Call MD for:  extreme fatigue   Complete by:  As directed    Call MD for:  persistant dizziness or light-headedness  Complete by:  As directed    Call MD for:  persistant nausea and vomiting   Complete by:  As directed    Call MD for:  redness, tenderness, or signs of infection (pain, swelling, redness, odor or green/yellow discharge around incision site)   Complete by:  As directed    Call MD for:  severe uncontrolled pain   Complete by:  As directed    Call MD for:  temperature >100.4   Complete by:  As directed    Diet - low sodium heart healthy   Complete by:  As directed    Diet Carb Modified   Complete by:  As directed    Discharge instructions   Complete by:  As directed    1) right lower extremity/leg dressing changes once to twice a day as advised may use Xeroform gauze as an astringent and antimicrobial (bactroban ointment) dressing.... May use heel boot for pressure injury prevention. Home health nurse may apply Louretta Parma boots/compression dressings once to twice a week    2) follow-up with your primary care doctor on Friday, 03/27/2018 for wound recheck  3) keep wound elevated, avoid salt in  your diet  4) take doxycycline antibiotic as prescribed for MRSA infection of your right leg  5) you are already taking apixaban/blood thinner so Avoid ibuprofen/Advil/Aleve/Motrin/Goody Powders/Naproxen/BC powders/Meloxicam/Diclofenac/Indomethacin and other Nonsteroidal anti-inflammatory medications as these will make you more likely to bleed and can cause stomach ulcers, can also cause Kidney problems.   Increase activity slowly   Complete by:  As directed        Discharge Medications     Allergies as of 03/24/2018   No Known Allergies     Medication List    STOP taking these medications   cephALEXin 500 MG capsule Commonly known as:  KEFLEX   ciprofloxacin 500 MG tablet Commonly known as:  CIPRO   ibuprofen 200 MG tablet Commonly known as:  ADVIL,MOTRIN     TAKE these medications   apixaban 5 MG Tabs tablet Commonly known as:  ELIQUIS Take 1 tablet (5 mg total) by mouth 2 (two) times daily.   doxycycline 100 MG tablet Commonly known as:  VIBRA-TABS Take 1 tablet (100 mg total) by mouth 2 (two) times daily for 10 days. For Rt leg MRSA infection   furosemide 40 MG tablet Commonly known as:  LASIX Take 1 tablet (40 mg total) by mouth daily as needed for fluid or edema.   lisinopril 2.5 MG tablet Commonly known as:  PRINIVIL,ZESTRIL Take 1 tablet (2.5 mg total) by mouth daily.   metFORMIN 500 MG tablet Commonly known as:  GLUCOPHAGE Take 2 tablets (1,000 mg total) by mouth 2 (two) times daily with a meal.   multivitamin with minerals Tabs tablet Take 1 tablet by mouth daily.   mupirocin ointment 2 % Commonly known as:  BACTROBAN Place 1 application into the nose 2 (two) times daily. May also apply to open areas right leg wound   oxyCODONE 5 MG immediate release tablet Commonly known as:  Oxy IR/ROXICODONE Take 1 tablet (5 mg total) by mouth every 4 (four) hours as needed for severe pain.   triamcinolone cream 0.1 % Commonly known as:  KENALOG Apply 1  application topically 2 (two) times daily. For 7-10 days maximum       Major procedures and Radiology Reports - PLEASE review detailed and final reports for all details, in brief -    Dg Ankle Complete Right  Result Date:  03/21/2018 CLINICAL DATA:  57 year old male with right and ankle swelling and redness. Evaluate for cellulitis. EXAM: RIGHT ANKLE - COMPLETE 3+ VIEW COMPARISON:  Radiograph dated 02/02/2018 FINDINGS: There is no acute fracture or dislocation. There is a 1 cm bone spurring of the plantar calcaneus. There is diffuse subcutaneous edema. IMPRESSION: 1. No acute fracture or dislocation. 2. Diffuse subcutaneous edema, possibly cellulitis. Clinical correlation is recommended. Electronically Signed   By: Anner Crete M.D.   On: 03/21/2018 03:57   Mr Foot Right Wo Contrast  Result Date: 03/22/2018 CLINICAL DATA:  Chronic right foot ulcer with pain and swelling. EXAM: MRI OF THE RIGHT FOREFOOT WITHOUT CONTRAST TECHNIQUE: Multiplanar, multisequence MR imaging of the right ankle/hindfoot was performed. No intravenous contrast was administered. COMPARISON:  Radiographs 03/21/2018 FINDINGS: There is an open wound along the medial aspect of the ankle but no underlying abscess is identified. There is subcutaneous soft tissue swelling/edema/fluid suggesting local/focal cellulitis. No discrete subcutaneous abscess. There is also mild edema involving the abductor hallucis muscle consistent with focal myositis. No MR findings suspicious for septic arthritis or osteomyelitis. Moderate tibiotalar and subtalar joint degenerative changes with small joint effusions. The medial and lateral ankle ligaments and tendons appear intact. The Achilles tendon and anterior ankle tendons are intact. The plantar fascia is intact. IMPRESSION: 1. Open wound along the medial aspect of the ankle but no underlying abscess is identified. 2. Mild myositis involving the abductor hallucis muscle. 3. No MR findings worrisome  for septic arthritis or osteomyelitis. Electronically Signed   By: Marijo Sanes M.D.   On: 03/22/2018 11:41   Vas [place] Burnard Bunting With/wo Tbi  Result Date: 03/22/2018 LOWER EXTREMITY DOPPLER STUDY Indications: Peripheral artery disease. Cellulitis right lower leg  Performing Technologist: Birdena Crandall, [place] [facility]  Examination Guidelines: A complete evaluation includes at minimum, Doppler waveform signals and systolic blood pressure reading at the level of bilateral brachial, anterior tibial, and posterior tibial arteries, when vessel segments are accessible. Bilateral testing is considered an integral part of a complete examination. Photoelectric Plethysmograph (PPG) waveforms and toe systolic pressure readings are included as required and additional duplex testing as needed. Limited examinations for reoccurring indications may be performed as noted.  ABI Findings: +--------+------------------+-----+---------+--------+ Right   Rt Pressure (mmHg)IndexWaveform Comment  +--------+------------------+-----+---------+--------+ [id number]                    triphasic         +--------+------------------+-----+---------+--------+ PTA     148               1.15 triphasic         +--------+------------------+-----+---------+--------+ DP      133               1.03 triphasic         +--------+------------------+-----+---------+--------+ +--------+------------------+-----+---------+-------+ Left    Lt Pressure (mmHg)IndexWaveform Comment +--------+------------------+-----+---------+-------+ [id number]                    triphasic        +--------+------------------+-----+---------+-------+ PTA     143               1.11 triphasic        +--------+------------------+-----+---------+-------+ DP      125               0.97 triphasic        +--------+------------------+-----+---------+-------+ +-------+-----------+-----------+------------+------------+ ABI/TBIToday's ABIToday's  TBIPrevious ABIPrevious TBI +-------+-----------+-----------+------------+------------+ Right  1.15                                           +-------+-----------+-----------+------------+------------+  Left   1.11                                           +-------+-----------+-----------+------------+------------+  Summary: Right: Resting right ankle-brachial index is within normal range. No evidence of significant right lower extremity arterial disease. Left: Resting left ankle-brachial index is within normal range. No evidence of significant left lower extremity arterial disease.  *See table(s) above for measurements and observations.  Electronically signed by Monica Martinez MD on 03/22/2018 at 3:58:48 PM.   Final     Micro Results    Recent Results (from the past 240 hour(s))  Wound or Superficial Culture     Status: None   Collection Time: 03/21/18  7:48 AM  Result Value Ref Range Status   Specimen Description ANKLE RIGHT  Final   Special Requests Immunocompromised  Final   Gram Stain   Final    NO WBC SEEN NO ORGANISMS SEEN Performed at [facility], [address], [place], [place] [address]    Culture   Final    MODERATE METHICILLIN RESISTANT STAPHYLOCOCCUS AUREUS   Report Status 03/23/2018 FINAL  Final   Organism ID, Bacteria METHICILLIN RESISTANT STAPHYLOCOCCUS AUREUS  Final      Susceptibility   Methicillin resistant staphylococcus aureus - MIC*    CIPROFLOXACIN <=0.5 SENSITIVE Sensitive     ERYTHROMYCIN >=8 RESISTANT Resistant     GENTAMICIN <=0.5 SENSITIVE Sensitive     OXACILLIN >=4 RESISTANT Resistant     TETRACYCLINE <=1 SENSITIVE Sensitive     VANCOMYCIN <=0.5 SENSITIVE Sensitive     TRIMETH/SULFA <=10 SENSITIVE Sensitive     CLINDAMYCIN <=0.25 SENSITIVE Sensitive     RIFAMPIN <=0.5 SENSITIVE Sensitive     Inducible Clindamycin NEGATIVE Sensitive     * MODERATE METHICILLIN RESISTANT STAPHYLOCOCCUS AUREUS  Blood Culture (routine x 2)      Status: None (Preliminary result)   Collection Time: 03/21/18  8:29 AM  Result Value Ref Range Status   Specimen Description BLOOD RIGHT ARM  Final   Special Requests   Final    BOTTLES DRAWN AEROBIC AND ANAEROBIC Blood Culture adequate volume   Culture   Final    NO GROWTH 3 DAYS Performed at [facility], [address], [place], [place] [address]    Report Status PENDING  Incomplete  Blood Culture (routine x 2)     Status: None (Preliminary result)   Collection Time: 03/21/18  8:31 AM  Result Value Ref Range Status   Specimen Description BLOOD LEFT HAND  Final   Special Requests   Final    BOTTLES DRAWN AEROBIC AND ANAEROBIC Blood Culture results may not be optimal due to an inadequate volume of blood received in culture bottles   Culture   Final    NO GROWTH 3 DAYS Performed at [facility], [place] [address] [place], [place] [address]    Report Status PENDING  Incomplete  MRSA PCR Screening     Status: Abnormal   Collection Time: 03/21/18 12:05 PM  Result Value Ref Range Status   MRSA by PCR POSITIVE (A) NEGATIVE Final    Comment:        The GeneXpert MRSA Assay (FDA approved for NASAL specimens only), is one component of a comprehensive MRSA colonization surveillance program. It is not intended to diagnose MRSA infection nor to guide or  monitor treatment for MRSA infections. RESULT CALLED TO, READ BACK BY AND VERIFIED WITH: A BARD RN [phone] GF        Today   Subjective    Evan Leonard today has no concerns, eating and drinking well, no fevers no chills, no palpitations no chest pains          Patient has been seen and examined prior to discharge   Objective   Blood pressure 110/66, pulse 94, temperature 98.8 F (37.1 C), temperature source Oral, resp. rate 18, height 6' 2.5" (1.892 m), weight (!) 158.8 kg, SpO2 95 %.   Intake/Output Summary (Last 24 hours) at 03/24/2018 1610 Last data filed at 03/24/2018 1514 Gross per 24 hour  Intake  1024.67 ml  Output 1150 ml  Net -125.33 ml    Exam Patient is examined daily including today on 03/24/18 , exams remain the same as of yesterday except that has changed   Gen:- Awake Alert, morbidly obese, in no acute distress HEENT:- [place].AT, No sclera icterus Neck-Supple Neck,No JVD,.  Lungs-  CTAB , good air movement CV- S1, S2 normal, regular Abd-  +ve B.Sounds, Abd Soft, No tenderness,    Extremity-good pulses Psych-affect is appropriate, oriented x3 Neuro-no new focal deficits, no tremors Right LE Foot and Rt LE skin Exam- Affected area measures 30cm x 24cm, Wound [id number] thickness open areas on medial heel and posterior LEcellulitis with partial thickness wound on medial heel. Heel and lower LE are weeping light yellow serous exudate. LE is erythematous, edematous and warm to the touch as compared to LLE. White callus and maceration surrounds the heel injury    Data Review   CBC w Diff:  Lab Results  Component Value Date   WBC 11.6 (H) 03/24/2018   HGB 12.1 (L) 03/24/2018   HGB 14.0 08/05/2013   HCT 39.0 03/24/2018   HCT 42.6 08/05/2013   PLT 247 03/24/2018   PLT 194 08/05/2013   LYMPHOPCT 21 03/24/2018   LYMPHOPCT 31.1 08/05/2013   MONOPCT 12 03/24/2018   MONOPCT 8.3 08/05/2013   EOSPCT 3 03/24/2018   EOSPCT 3.8 08/05/2013   BASOPCT 0 03/24/2018   BASOPCT 0.4 08/05/2013   CMP:  Lab Results  Component Value Date   NA 134 (L) 03/24/2018   K 4.0 03/24/2018   CL 103 03/24/2018   CO2 22 03/24/2018   BUN 17 03/24/2018   CREATININE 1.21 03/24/2018   PROT 7.0 03/21/2018   ALBUMIN 3.4 (L) 03/21/2018   BILITOT 0.6 03/21/2018   ALKPHOS 52 03/21/2018   AST 19 03/21/2018   ALT 17 03/21/2018    Total Discharge time is about 33 minutes  Roxan Hockey M.D on 03/24/2018 at 4:10 PM  Pager---[phone]  Go to [email or web address] - password [email or web address] for contact info  [facility] - Office  [phone]

## 2018-03-26 ENCOUNTER — Telehealth: Payer: Self-pay | Admitting: *Deleted

## 2018-03-26 LAB — CULTURE, BLOOD (ROUTINE X 2)
Culture: NO GROWTH
Culture: NO GROWTH
Special Requests: ADEQUATE

## 2018-03-26 NOTE — Telephone Encounter (Signed)
Per chart review: Admission date:  03/21/2018  Admitting Physician  Reubin Milan, MD  Discharge Date:  03/24/2018   Primary MD  [name], MD  Recommendations for primary care physician for things to follow:   1) right lower extremity/leg dressing changes once to twice a day as advised may use Xeroform gauze as an astringent and antimicrobial (bactroban ointment) dressing.... May use heel boot for pressure injury prevention. Home health nurse may apply Louretta Parma boots/compression dressings once to twice a week    2) follow-up with your primary care doctor on Friday, 03/27/2018 for wound recheck  3) keep wound elevated, avoid salt in your diet  4) take doxycycline antibiotic as prescribed for MRSA infection of your right leg  5) you are already taking apixaban/blood thinner so Avoid ibuprofen/Advil/Aleve/Motrin/Goody Powders/Naproxen/BC powders/Meloxicam/Diclofenac/Indomethacin and other Nonsteroidal anti-inflammatory medications as these will make you more likely to bleed and can cause stomach ulcers, can also cause Kidney problems.   ___________________________________________________________ Transition Care Management Follow-up Telephone Call   Date discharged? 03/24/18   How have you been since you were released from the hospital? "good"   Do you understand why you were in the hospital? yes   Do you understand the discharge instructions? yes   Where were you discharged to? Home   Items Reviewed:  Medications reviewed: yes  Allergies reviewed: yes  Dietary changes reviewed: yes  Referrals reviewed: yes   Functional Questionnaire:   Activities of Daily Living (ADLs):   He states they are independent in the following: ambulation, bathing and hygiene, feeding, continence, grooming, toileting and dressing States they require assistance with the following: none. home health nurse coming tomorrow to complete dressing changes.   Any transportation  issues/concerns?: no   Any patient concerns? No. Patient states he has some swelling to wound but he is taking his antibiotics and doing dressing changes. Home health nurse comes tomorrow to assess. Explained to patient to keep leg elevated and call office if any worsening or new symptoms start. Patient stated understanding.   Confirmed importance and date/time of follow-up visits scheduled yes  Provider Appointment booked with Dr Yong Channel 03/30/18 2:15. Was originally scheduled as CPE. Explained to patient that he will need to reschedule CPE as he will be seen as hospital follow up on Monday.   Confirmed with patient if condition begins to worsen call PCP or go to the ER.  Patient was given the office number and encouraged to call back with question or concerns.  : yes

## 2018-03-26 NOTE — Telephone Encounter (Signed)
Left voicemail requesting call back.  

## 2018-03-26 NOTE — Telephone Encounter (Signed)
noted thanks  

## 2018-03-27 ENCOUNTER — Inpatient Hospital Stay: Payer: PRIVATE HEALTH INSURANCE | Admitting: Family Medicine

## 2018-03-30 ENCOUNTER — Ambulatory Visit: Payer: PRIVATE HEALTH INSURANCE | Admitting: Family Medicine

## 2018-03-30 ENCOUNTER — Encounter: Payer: Self-pay | Admitting: Family Medicine

## 2018-03-30 VITALS — BP 108/64 | HR 92 | Temp 98.7°F | Ht 74.5 in | Wt 344.6 lb

## 2018-03-30 DIAGNOSIS — L03115 Cellulitis of right lower limb: Secondary | ICD-10-CM

## 2018-03-30 DIAGNOSIS — E11621 Type 2 diabetes mellitus with foot ulcer: Secondary | ICD-10-CM | POA: Diagnosis not present

## 2018-03-30 DIAGNOSIS — L97519 Non-pressure chronic ulcer of other part of right foot with unspecified severity: Secondary | ICD-10-CM

## 2018-03-30 DIAGNOSIS — G4733 Obstructive sleep apnea (adult) (pediatric): Secondary | ICD-10-CM

## 2018-03-30 DIAGNOSIS — E1165 Type 2 diabetes mellitus with hyperglycemia: Secondary | ICD-10-CM | POA: Diagnosis not present

## 2018-03-30 DIAGNOSIS — I82432 Acute embolism and thrombosis of left popliteal vein: Secondary | ICD-10-CM

## 2018-03-30 DIAGNOSIS — I2699 Other pulmonary embolism without acute cor pulmonale: Secondary | ICD-10-CM | POA: Diagnosis not present

## 2018-03-30 DIAGNOSIS — I872 Venous insufficiency (chronic) (peripheral): Secondary | ICD-10-CM

## 2018-03-30 MED ORDER — HYDROCODONE-ACETAMINOPHEN 5-325 MG PO TABS: 0.5000 | ORAL_TABLET | Freq: Three times a day (TID) | ORAL | 0 refills | Status: DC | PRN

## 2018-03-30 NOTE — Patient Instructions (Signed)
Go ahead and schedule a physical for 3-6 months from now  See me next Monday 04/06/18 at 2 45- please schedule this at the desk before you leave  I gave you some hydrocodone to use for pain- hoping you will need less and less. The plan after this prescription is to go to tylenol  Depending on how things look next week- may refer to Dr. Sharol Given for his opinion (orthopedic surgeon but honestly also an amazing diabetic wound care specialist as well).   I think one more unna boot session would be great considering how you did with this round

## 2018-03-30 NOTE — Assessment & Plan Note (Addendum)
With associated cellulitis Area of callous largely debrided on medial portion of right foot. Above this there is an area of "veins" per patient- bluish discoloration that is tender and patient states that is the area that was draining previously- I cannot fully describe this area as there is still some scaling over it and too painful for me to remove today- but this may be an ulceration.   Unnaboot therapy through home health has been helpful- will askthem to reapply tomorrow .   I am strongly still considering a referral to Dr. Sharol Given for his opinion given recurrence of issues. Patient has compression stockings already but may benefit form more advanced system.   For pain control- did give short course of hydrocodone to use sparingly.   I wrote him out of work for 1 more week so he can continue elevation. We will reassess next Monday and decide on referral at that time.

## 2018-03-30 NOTE — Progress Notes (Signed)
Subjective:  Evan Leonard is a 57 y.o. year old very pleasant male patient who presents for transitional care management and hospital follow up for cellulitis of right lower leg. Patient was hospitalized from 03/21/2018 to 03/24/2018. A TCM phone call was completed on 03/26/2018. Medical complexity high  Evan Leonard is a 57 year old male with a history of type 2 diabetes, history right foot ulcer, history of DVT, history of PE, hypertension, obesity, OSA on CPAP, peripheral vascular disease who presented to the emergency room with right ankle edema and tenderness as well as erythema and purulent discharge found to have right lower extremity cellulitis.  In the emergency room he had stable vital signs with the exception of some mild tachycardia at 113.  He received IV fluids, vancomycin, ceftriaxone and Flagyl.  Urinalysis was normal.  He did have a leukocytosis with 13.3K WBC.  He had mild anemia at 12.9 g/dL.  Lactic acid levels were around 2 initially.  X-ray of the right ankle was obtained and did not show any obvious osteomyelitis-just soft tissue swelling.  I independently reviewed this x-ray from 03/21/18 and saw no evidence of fracture or dislocation. Some soft tissue edema near skin at left lateral lower foot. Bone spurring noted on plantar portion of calcaneus. He later required MRI of his foot which showed open wound on the medial aspect of the ankle but no underlying abscess, mild myositis of the abductor muscles.  No signs of septic arthritis or osteomyelitis.  Patient was continuing to have pain at discharge and they gave him #10 oxycodone of which he has one day left. Uses it only if pain is really bothering him. He has 4 days of doxycycline left. He asks for additional assistance with pain control ongoing. We were having trouble getting into [facility] at this time so unfortunately were unable to log in- with that being said thought it was in best interest of patient to go ahead and fill though stay  within 5 days of acute pain treatment limits.   ABIs were done in the hospital which showed no significant abnormality.  White blood cell count trended down.  CRP was not elevated lactic acid improved antibiotics and IV fluids.  Wound culture on 03/21/2018 showed MRSA.  He was discharged home on doxycycline so he would have MRSA coverage. unnaboot has been used by home health and seemed to be helpful  His baseline diabetes remained controlled with sliding scale insulin.  He was restarted on hold metformin on discharge.  They referenced his good control with A1c of 7.0.  Hypertension was stable on lisinopril.  History of DVT and PE-continue to hold Eliquis.  His morbid obesity and sleep apnea-continued on CPAP and lifestyle modifications advised.  See problem oriented charting as well ROS- has some right medial foot pain as well as some right lateral ankle pain. Denies increased edema. Does not report chest pain or shortness of breath.   Past Medical History-  Patient Active Problem List   Diagnosis Date Noted  . Atypical chest pain 02/09/2018    Priority: High  . Diabetes mellitus with hyperglycemia ([place]) 03/31/2013    Priority: High  . Pulmonary embolism ([place]) 05/05/2012    Priority: High  . Morbid obesity with body mass index of 40.0-44.9 in adult [facility]) 05/05/2012    Priority: High  . DVT of left distal popliteal vein 05/05/2012    Priority: High  . S/P repair of recurrent ventral hernia 04/26/2017    Priority: Medium  . Chronic venous  insufficiency 01/25/2015    Priority: Medium  . Obstructive sleep apnea 05/05/2012    Priority: Medium  . Renal stone 05/12/2017    Priority: Low  . Hyponatremia 03/23/2013    Priority: Low  . Diabetic ulcer of right foot due to type 2 diabetes mellitus ([place]) 03/21/2018    Medications- reviewed and updated  A medical reconciliation was performed comparing current medicines to hospital discharge medications. Current Outpatient Medications   Medication Sig Dispense Refill  . apixaban (ELIQUIS) 5 MG TABS tablet Take 1 tablet (5 mg total) by mouth 2 (two) times daily. 60 tablet 5  . doxycycline (VIBRA-TABS) 100 MG tablet Take 1 tablet (100 mg total) by mouth 2 (two) times daily for 10 days. For Rt leg MRSA infection 20 tablet 0  . furosemide (LASIX) 40 MG tablet Take 1 tablet (40 mg total) by mouth daily as needed for fluid or edema. 30 tablet 2  . lisinopril (PRINIVIL,ZESTRIL) 2.5 MG tablet Take 1 tablet (2.5 mg total) by mouth daily. 90 tablet 3  . metFORMIN (GLUCOPHAGE) 500 MG tablet Take 2 tablets (1,000 mg total) by mouth 2 (two) times daily with a meal. 120 tablet 5  . Multiple Vitamin (MULTIVITAMIN WITH MINERALS) TABS tablet Take 1 tablet by mouth daily.     . mupirocin ointment (BACTROBAN) 2 % Place 1 application into the nose 2 (two) times daily. May also apply to open areas right leg wound 22 g 0  . oxyCODONE (ROXICODONE) 5 MG immediate release tablet Take 1 tablet (5 mg total) by mouth every 4 (four) hours as needed for severe pain. 10 tablet 0  . HYDROcodone-acetaminophen (NORCO/VICODIN) 5-325 MG tablet Take 0.5-1 tablets by mouth every 8 (eight) hours as needed for moderate pain or severe pain. 12 tablet 0   No current facility-administered medications for this visit.     Objective: BP 108/64   Pulse 92   Temp 98.7 F (37.1 C) (Oral)   Ht 6' 2.5" (1.892 m)   Wt (!) 344 lb 9.6 oz (156.3 kg)   SpO2 95%   BMI 43.65 kg/m  Gen: NAD, resting comfortably CV: RRR no murmurs rubs or gallops Lungs: CTAB no crackles, wheeze, rhonchi Abdomen: soft/nontender/obese Ext: 1+ edema bilaterally On right lower leg there is mild erythema below still marked line from cellulitis but not nearly as intense as prior examinations per patient. No warmth noted. Large amount of scaling over shins- able to remove some of this. Some callous at right medial foot- able to pull some of this away without underlying infection noted- above callous  area- some scale covering area with bluish discoloration that is tender to touch and scale not able to be removed. Right lateral ankle with some tenderness to palpation (patient states had odd position in MRI and has hurt since then but no fall or injury) Skin: warm, dry  Assessment/Plan:  Diabetic ulcer of right foot due to type 2 diabetes mellitus ([place]) With associated cellulitis Area of callous largely debrided on medial portion of right foot. Above this there is an area of "veins" per patient- bluish discoloration that is tender and patient states that is the area that was draining previously- I cannot fully describe this area as there is still some scaling over it and too painful for me to remove today- but this may be an ulceration.   Unnaboot therapy through home health has been helpful- will askthem to reapply tomorrow .   I am strongly still considering a referral to  Dr. Sharol Given for his opinion given recurrence of issues. Patient has compression stockings already but may benefit form more advanced system.   For pain control- did give short course of hydrocodone to use sparingly.   I wrote him out of work for 1 more week so he can continue elevation. We will reassess next Monday and decide on referral at that time.   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 Needing sliding scale in hospital now back to just metformin   OSA on cpap- remains on cpap at home  Venous insufficiency- will need to get back in compression stockings after next visit likely. He uses lasix 40mg  daily to help with edema as needed.   No true hypertension- lasix for venous insufficiency and lisinopril for renal protection though BP low today- will hold lisinopril until next week or if BP goes up to 140/90   Future Appointments   Date Time Provider [place]  04/06/2018  2:45 PM [name], MD [facility]   Lab/Order associations: Type 2 diabetes mellitus with hyperglycemia, without long-term current use of insulin (HCC)  Diabetic ulcer of right foot due to type 2 diabetes mellitus ([place])  Meds ordered this encounter  Medications  . HYDROcodone-acetaminophen (NORCO/VICODIN) 5-325 MG tablet    Sig: Take 0.5-1 tablets by mouth every 8 (eight) hours as needed for moderate pain or severe pain.    Dispense:  12 tablet    Refill:  0   Return precautions advised.  Garret Reddish, MD

## 2018-04-06 ENCOUNTER — Ambulatory Visit: Payer: PRIVATE HEALTH INSURANCE | Admitting: Family Medicine

## 2018-04-06 ENCOUNTER — Encounter: Payer: Self-pay | Admitting: Family Medicine

## 2018-04-06 VITALS — BP 118/72 | HR 110 | Temp 98.5°F | Ht 74.5 in | Wt 341.2 lb

## 2018-04-06 DIAGNOSIS — L97519 Non-pressure chronic ulcer of other part of right foot with unspecified severity: Secondary | ICD-10-CM | POA: Diagnosis not present

## 2018-04-06 DIAGNOSIS — Z6841 Body Mass Index (BMI) 40.0 and over, adult: Secondary | ICD-10-CM

## 2018-04-06 DIAGNOSIS — Z9981 Dependence on supplemental oxygen: Secondary | ICD-10-CM

## 2018-04-06 DIAGNOSIS — G4733 Obstructive sleep apnea (adult) (pediatric): Secondary | ICD-10-CM

## 2018-04-06 DIAGNOSIS — I10 Essential (primary) hypertension: Secondary | ICD-10-CM

## 2018-04-06 DIAGNOSIS — E11621 Type 2 diabetes mellitus with foot ulcer: Secondary | ICD-10-CM | POA: Diagnosis not present

## 2018-04-06 DIAGNOSIS — Z86711 Personal history of pulmonary embolism: Secondary | ICD-10-CM

## 2018-04-06 DIAGNOSIS — Z7984 Long term (current) use of oral hypoglycemic drugs: Secondary | ICD-10-CM

## 2018-04-06 DIAGNOSIS — L03115 Cellulitis of right lower limb: Secondary | ICD-10-CM | POA: Diagnosis not present

## 2018-04-06 DIAGNOSIS — K760 Fatty (change of) liver, not elsewhere classified: Secondary | ICD-10-CM

## 2018-04-06 DIAGNOSIS — Z86718 Personal history of other venous thrombosis and embolism: Secondary | ICD-10-CM

## 2018-04-06 DIAGNOSIS — L97311 Non-pressure chronic ulcer of right ankle limited to breakdown of skin: Secondary | ICD-10-CM

## 2018-04-06 DIAGNOSIS — E1151 Type 2 diabetes mellitus with diabetic peripheral angiopathy without gangrene: Secondary | ICD-10-CM

## 2018-04-06 DIAGNOSIS — Z7901 Long term (current) use of anticoagulants: Secondary | ICD-10-CM

## 2018-04-06 NOTE — Assessment & Plan Note (Signed)
S: patient working hard on weight loss. He is down another 3 lbs this visit in addition to the 6 lbs he lost by last visit.  A/P: making great progress. Encouraged need for healthy eating, regular exercise, continued weight loss. Particularly pertinent with venous insufficiency and associated diabetic wounds

## 2018-04-06 NOTE — Patient Instructions (Signed)
Health Maintenance Due  Topic Date Due  . COLONOSCOPY-need to get this done when you are feeling better 05/06/2009  . OPHTHALMOLOGY EXAM-Sign release of information at the check out desk for this if you've had it done or get it set up if you have not 04/26/2014   Lets get she set up with at least one more Unna boot.  I did a referral to Dr. due to of orthopedics-hoping to get scan within the next week or 2.  They should call you.  If you do not hear by early next week please give [place] a call.  If you have worsening pain or symptoms please let us know immediately.

## 2018-04-06 NOTE — Assessment & Plan Note (Signed)
S: cellulitis appears to have resolved. Still has ulceration at right medial ankle which has potential to cause recurrence of cellulitis.Patient's pain remarkably improved- states only needed 1.5 of the new hydrocodone and hasnt needed in several days. No aches or pains walking on this- only when he presses on areas.   He was placed in another unnaboot last viist. When we unwrapped wound- there was some weeping from right lower leg extending to right medial ankle.   A/P: cellulitis resolved but will refer to Dr. Sharol Given for his expert management of diabetic foot wounds. Will use unnaboot changes through home health until that time.   I do think he can return to work if on light duty. See letter written

## 2018-04-06 NOTE — Progress Notes (Signed)
Subjective:  Evan Leonard is a 57 y.o. year old very pleasant male patient who presents for/with See problem oriented charting ROS-no fever, chills, nausea, vomiting  Past Medical History-  Patient Active Problem List   Diagnosis Date Noted  . Atypical chest pain 02/09/2018    Priority: High  . Diabetes mellitus with hyperglycemia ([place]) 03/31/2013    Priority: High  . Pulmonary embolism ([place]) 05/05/2012    Priority: High  . Morbid obesity with body mass index of 40.0-44.9 in adult [facility]) 05/05/2012    Priority: High  . DVT of left distal popliteal vein 05/05/2012    Priority: High  . S/P repair of recurrent ventral hernia 04/26/2017    Priority: Medium  . Chronic venous insufficiency 01/25/2015    Priority: Medium  . Obstructive sleep apnea 05/05/2012    Priority: Medium  . Renal stone 05/12/2017    Priority: Low  . Hyponatremia 03/23/2013    Priority: Low  . Diabetic ulcer of right foot due to type 2 diabetes mellitus ([place]) 03/21/2018    Medications- reviewed and updated Current Outpatient Medications  Medication Sig Dispense Refill  . apixaban (ELIQUIS) 5 MG TABS tablet Take 1 tablet (5 mg total) by mouth 2 (two) times daily. 60 tablet 5  . furosemide (LASIX) 40 MG tablet Take 1 tablet (40 mg total) by mouth daily as needed for fluid or edema. 30 tablet 2  . HYDROcodone-acetaminophen (NORCO/VICODIN) 5-325 MG tablet Take 0.5-1 tablets by mouth every 8 (eight) hours as needed for moderate pain or severe pain. 12 tablet 0  . lisinopril (PRINIVIL,ZESTRIL) 2.5 MG tablet Take 1 tablet (2.5 mg total) by mouth daily. 90 tablet 3  . metFORMIN (GLUCOPHAGE) 500 MG tablet Take 2 tablets (1,000 mg total) by mouth 2 (two) times daily with a meal. 120 tablet 5  . Multiple Vitamin (MULTIVITAMIN WITH MINERALS) TABS tablet Take 1 tablet by mouth daily.     . mupirocin ointment (BACTROBAN) 2 % Place 1 application into the nose 2 (two) times daily. May also apply to open areas right leg wound  (Patient not taking: Reported on 04/06/2018) 22 g 0   Objective: BP 118/72 (BP Location: Left Arm, Patient Position: Sitting, Cuff Size: Large)   Pulse (!) 110   Temp 98.5 F (36.9 C) (Oral)   Ht 6' 2.5" (1.892 m)   Wt (!) 341 lb 3.2 oz (154.8 kg)   SpO2 95%   BMI 43.22 kg/m  Gen: NAD, resting comfortably CV: RRR no murmurs rubs or gallops Lungs: CTAB no crackles, wheeze, rhonchi Abdomen: soft/nontender/nondistended/normal bowel sounds. No rebound or guarding.  Ext: 1+ edema bilaterally Skin: warm, dry, on right lower leg erythema continues to receed. Scaling is much improved from last leg on right lower leg. Below medial malleolus there is a 2x2 patch of erythematous skin that is tender to touch- some opening of epidermis noted- slight oozing. About 8 cm up from this there is another area of slight ulceration that is less tender. Overall areas have improved and are less tender.  Neuro: grossly normal, moves all extremities  Assessment/Plan:  Diabetic ulcer of right foot due to type 2 diabetes mellitus ([place]) S: cellulitis appears to have resolved. Still has ulceration at right medial ankle which has potential to cause recurrence of cellulitis.Patient's pain remarkably improved- states only needed 1.5 of the new hydrocodone and hasnt needed in several days. No aches or pains walking on this- only when he presses on areas.   He was placed in  another unnaboot last viist. When we unwrapped wound- there was some weeping from right lower leg extending to right medial ankle.   A/P: cellulitis resolved but will refer to Dr. Sharol Given for his expert management of diabetic foot wounds. Will use unnaboot changes through home health until that time.   I do think he can return to work if on light duty. See letter written  Morbid obesity with body mass index of 40.0-44.9 in adult [facility]) S: patient working hard on weight loss. He is down another 3 lbs this visit in addition to the 6 lbs he lost by last  visit.  A/P: making great progress. Encouraged need for healthy eating, regular exercise, continued weight loss. Particularly pertinent with venous insufficiency and associated diabetic wounds   Future Appointments  Date Time Provider [place]  04/13/2018  8:00 AM Newt Minion, MD [facility] None  07/06/2018  2:20 PM [name], MD [facility]   Lab/Order associations: Diabetic ulcer of right foot due to type 2 diabetes mellitus ([place]) - Plan: Ambulatory referral to Orthopedics  Return precautions advised.  Garret Reddish, MD

## 2018-04-13 ENCOUNTER — Ambulatory Visit (INDEPENDENT_AMBULATORY_CARE_PROVIDER_SITE_OTHER): Payer: PRIVATE HEALTH INSURANCE | Admitting: Physician Assistant

## 2018-04-13 ENCOUNTER — Encounter (INDEPENDENT_AMBULATORY_CARE_PROVIDER_SITE_OTHER): Payer: Self-pay | Admitting: Orthopedic Surgery

## 2018-04-13 VITALS — Ht 74.5 in | Wt 341.2 lb

## 2018-04-13 DIAGNOSIS — I2699 Other pulmonary embolism without acute cor pulmonale: Secondary | ICD-10-CM | POA: Diagnosis not present

## 2018-04-13 DIAGNOSIS — I82432 Acute embolism and thrombosis of left popliteal vein: Secondary | ICD-10-CM | POA: Diagnosis not present

## 2018-04-13 DIAGNOSIS — E1142 Type 2 diabetes mellitus with diabetic polyneuropathy: Secondary | ICD-10-CM

## 2018-04-13 DIAGNOSIS — L97919 Non-pressure chronic ulcer of unspecified part of right lower leg with unspecified severity: Secondary | ICD-10-CM

## 2018-04-13 DIAGNOSIS — I87331 Chronic venous hypertension (idiopathic) with ulcer and inflammation of right lower extremity: Secondary | ICD-10-CM | POA: Diagnosis not present

## 2018-04-13 NOTE — Progress Notes (Signed)
Office Visit Note   [name]           Date of Birth: [birth year]           MRN: [id number] Visit Date: 04/13/2018              Requested by: [name], MD [place], [place] [address] PCP: [name], MD  Chief Complaint  Patient presents with  . Right Leg - Wound Check    Np- ulcerated wound around ankle and lateral leg      HPI: The patient is a 57 year old male with a history of type 2 diabetes, and venous insufficiency ulcers of his right calf who is seen for evaluation of his right calf ulcers.  He also has a history of DVT/PE and wears compression to his left lower extremity chronically.  He is also on Eliquis.  He reports that about 2 weeks ago he noticed some weeping over his right lower extremity and then he developed some ulceration with apparent cellulitis.  He was admitted to the hospital for intravenous antibiotic therapy and sent home with home health nursing doing Unna boot application every 3 to 4 days.  He completed his course of oral antibiotics following discharge from the hospital.  He reports continued swelling of the leg and continued drainage from the ulceration.  He did have cultures of the wounds while hospitalized and reports that this grew MRSA.  He had bilateral lower extremity ultrasound with ABI and this did not show any significant evidence for arterial disease.  He also had MRI scanning of the right lower extremity which showed open wounds along the medial aspect of the ankle but no underlying abscess.  There was some mild myositis involving the abductor hallucis muscle.  No findings worrisome for septic arthritis or osteomyelitis.  Assessment & Plan: Visit Diagnoses:  1. Chronic venous hypertension (idiopathic) with ulcer and inflammation of right lower extremity (HCC)   2. Type 2 diabetes mellitus with diabetic polyneuropathy, without long-term current use of insulin ([place])   3. Deep vein thrombosis (DVT) of  popliteal vein of left lower extremity, unspecified chronicity (HCC)   4. Other pulmonary embolism without acute cor pulmonale, unspecified chronicity ([place])     Plan: The patient will begin wearing we have a compression stockings and given a prescription for such.  He is instructed to wear the sock at all times except for showering.  During the daytime he will put on the view of a sleeve on top of the sock while he is open wound.  Patient was instructed to follow-up in 2 weeks or sooner should he have difficulties in the interim.  Follow-Up Instructions: Return in about 2 weeks (around 04/27/2018).   Ortho Exam  Patient is alert, oriented, no adenopathy, well-dressed, normal affect, normal respiratory effort. The right lower extremity has a superficial ulceration over the medial ankle and calf.  There are are venous stasis changes with hemosiderin staining, callus and dry skin.  There are no current signs of cellulitis.  He has good pedal pulses in the right lower extremity.  He is wearing a compression stocking on the left lower extremity.  Imaging: No results found.   Labs: Lab Results  Component Value Date   HGBA1C 7.0 (H) 03/21/2018   HGBA1C 6.4 (A) 02/09/2018   HGBA1C 6.7 (H) 06/18/2017   ESRSEDRATE 28 (H) 03/21/2018   CRP <0.8 03/21/2018   REPTSTATUS 03/26/2018 FINAL 03/21/2018   GRAMSTAIN  03/21/2018    NO WBC SEEN NO ORGANISMS SEEN Performed at [facility], [place] [address] [place], [place] [address]    CULT  03/21/2018    NO GROWTH 5 DAYS Performed at [facility] [address] [place], [place] [address]    LABORGA METHICILLIN RESISTANT STAPHYLOCOCCUS AUREUS 03/21/2018     Lab Results  Component Value Date   ALBUMIN 3.4 (L) 03/21/2018   ALBUMIN 3.9 02/09/2018   ALBUMIN 3.6 06/18/2017   PREALBUMIN 15.8 (L) 03/21/2018    Body mass index is 43.22 kg/m.  Orders:  No orders of the defined types were placed in this encounter.  No orders of the  defined types were placed in this encounter.    Procedures: No procedures performed  Clinical Data: No additional findings.  ROS:  All other systems negative, except as noted in the HPI. Review of Systems  Objective: Vital Signs: Ht 6' 2.5" (1.892 m)   Wt (!) 341 lb 3.2 oz (154.8 kg)   BMI 43.22 kg/m   Specialty Comments:  No specialty comments available.  PMFS History: Patient Active Problem List   Diagnosis Date Noted  . Diabetic ulcer of right foot due to type 2 diabetes mellitus ([place]) 03/21/2018  . Atypical chest pain 02/09/2018  . Renal stone 05/12/2017  . S/P repair of recurrent ventral hernia 04/26/2017  . Chronic venous insufficiency 01/25/2015  . Diabetes mellitus with hyperglycemia ([place]) 03/31/2013  . Hyponatremia 03/23/2013  . Pulmonary embolism ([place]) 05/05/2012  . Morbid obesity with body mass index of 40.0-44.9 in adult [facility]) 05/05/2012  . Obstructive sleep apnea 05/05/2012  . DVT of left distal popliteal vein 05/05/2012   Past Medical History:  Diagnosis Date  . Diabetes mellitus, type II ([place]) 02/2013  . Diabetic ulcer of right foot due to type 2 diabetes mellitus ([place]) 03/21/2018  . DVT (deep venous thrombosis) ([place]) 05/04/2012   LLE  . Exertional dyspnea 05/04/2012   "isolated episode" (05/05/2012)  . Hepatic steatosis   . History of chickenpox   . Hypertension 03/21/2018  . Obesity   . Obesity, Class III, BMI 40-49.9 (morbid obesity) ([place]) 03/21/2018  . OSA on CPAP   . Peripheral vascular disease (HCC)    varicose veins  . Pulmonary embolism ([place]) 05/04/2012   bilaterally  . Pyelonephritis 04/2017  . Small bowel obstruction ([place]) 09/06/2015  . Varicose veins     Family History  Problem Relation Age of Onset  . Diabetes Mother        died age 54- also bleeding ulcer  . Hypertension Mother   . Heart failure Father        in his 5s (patient was 73). day before surgery planned  . Other Maternal Grandmother        states natural causes all  grandparents    Past Surgical History:  Procedure Laterality Date  . CYSTOSCOPY W/ URETERAL STENT PLACEMENT Right 05/12/2017   Procedure: CYSTOSCOPY WITH RETROGRADE PYELOGRAM/URETERAL STENT PLACEMENT;  Surgeon: [name], MD;  Location: [facility];  Service: Urology;  Laterality: Right;  . CYSTOSCOPY/URETEROSCOPY/HOLMIUM LASER/STENT PLACEMENT Right 07/29/2017   Procedure: CYSTOSCOPY RIGHT URETEROSCOPY HOLMIUM LASER STENT EXCHANGE;  Surgeon: [name], MD;  Location: [facility];  Service: Urology;  Laterality: Right;  . HERNIA REPAIR     2015  . LAPAROTOMY N/A 12/13/2013   Procedure: EXPLORATORY LAPAROTOMY Repair ventral hernia, without mesh, partial omentectomy;  Surgeon: Gwenyth Ober, MD;  Location: [facility];  Service: General;  Laterality: N/A;  . VENTRAL HERNIA REPAIR N/A 04/26/2017   Procedure: INCARCERATED HERNIA REPAIR VENTRAL ADULT;  Surgeon: Georganna Skeans, MD;  Location: [facility];  Service: General;  Laterality: N/A;   Social History   Occupational History  . 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 Smoking status: Never Smoker  . Smokeless tobacco: Never Used  Substance and Sexual Activity  . Alcohol use: No    Alcohol/week: 0.0 standard drinks  . Drug use: No  . Sexual activity: Not Currently

## 2018-04-14 NOTE — Progress Notes (Signed)
Erroneous encounter-patient no showed visit.  Pre-charting had been completed

## 2018-04-27 ENCOUNTER — Ambulatory Visit (INDEPENDENT_AMBULATORY_CARE_PROVIDER_SITE_OTHER): Payer: PRIVATE HEALTH INSURANCE | Admitting: Physician Assistant

## 2018-04-27 ENCOUNTER — Encounter (INDEPENDENT_AMBULATORY_CARE_PROVIDER_SITE_OTHER): Payer: Self-pay | Admitting: Orthopedic Surgery

## 2018-04-27 VITALS — Ht 74.5 in | Wt 341.2 lb

## 2018-04-27 DIAGNOSIS — I87331 Chronic venous hypertension (idiopathic) with ulcer and inflammation of right lower extremity: Secondary | ICD-10-CM

## 2018-04-27 DIAGNOSIS — L97919 Non-pressure chronic ulcer of unspecified part of right lower leg with unspecified severity: Secondary | ICD-10-CM | POA: Diagnosis not present

## 2018-04-27 DIAGNOSIS — E1142 Type 2 diabetes mellitus with diabetic polyneuropathy: Secondary | ICD-10-CM | POA: Diagnosis not present

## 2018-04-27 NOTE — Progress Notes (Signed)
Office Visit Note   [name]           Date of Birth: 1960-12-21           MRN: [id number] Visit Date: 04/27/2018              Requested by: [name], MD [place], [place] [address] PCP: [name], MD  Chief Complaint  Patient presents with  . 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 He has been wearing silver compression socks and using the compression sleeve during the daytime on top of the sock. His edema is improved and his ulcers have dried up and closed. His leg is extremely dry now.   Assessment & Plan: Visit Diagnoses:  1. Chronic venous hypertension (idiopathic) with ulcer and inflammation of right lower extremity (HCC)   2. Type 2 diabetes mellitus with diabetic polyneuropathy, without long-term current use of insulin (HCC)     Plan: We discussed using some Aquaphor ointment to the right leg following showering with dial soap and water and then continuing to use the silver compression sock except for showers and the compression sleeve during the daytime as before. He will follow up in 4 weeks or sooner if problems in the interim.   Follow-Up Instructions: Return in about 4 weeks (around 05/25/2018).   Ortho Exam  Patient is alert, oriented, no adenopathy, well-dressed, normal affect, normal respiratory effort. The right lower leg erythema and edema are improving. The scattered weeping open skin has closed and he has severe xerosis of the skin. No signs of infection or cellulitis. Hemosiderin staining without change. Palpable pedal pulses.   Imaging: No results found. No images are attached to the encounter.  Labs: Lab Results  Component Value Date   HGBA1C 7.0 (H) 03/21/2018   HGBA1C 6.4 (A) 02/09/2018   HGBA1C 6.7 (H) 06/18/2017   ESRSEDRATE 28 (H) 03/21/2018   CRP <0.8  03/21/2018   REPTSTATUS 03/26/2018 FINAL 03/21/2018   GRAMSTAIN  03/21/2018    NO WBC SEEN NO ORGANISMS SEEN Performed at [facility], [place] [address], [place], [place] [address]    CULT  03/21/2018    NO GROWTH 5 DAYS Performed at [facility] [address], [place], [place] [address]    LABORGA METHICILLIN RESISTANT STAPHYLOCOCCUS AUREUS 03/21/2018     Lab Results  Component Value Date   ALBUMIN 3.4 (L) 03/21/2018   ALBUMIN 3.9 02/09/2018   ALBUMIN 3.6 06/18/2017   PREALBUMIN 15.8 (L) 03/21/2018    Body mass index is 43.22 kg/m.  Orders:  No orders of the defined types were placed in this encounter.  No orders of the defined types were placed in this encounter.    Procedures: No procedures performed  Clinical Data: No additional findings.  ROS:  All other systems negative, except as noted in the HPI. Review of Systems  Objective: Vital Signs: Ht 6' 2.5" (1.892 m)   Wt (!) 341 lb 3.2 oz (154.8 kg)   BMI 43.22 kg/m   Specialty Comments:  No specialty comments available.  PMFS History: Patient Active Problem List   Diagnosis Date Noted  . Diabetic ulcer of right foot due to type 2 diabetes mellitus ([place]) 03/21/2018  . Atypical chest pain 02/09/2018  . Renal stone 05/12/2017  .  S/P repair of recurrent ventral hernia 04/26/2017  . Chronic venous insufficiency 01/25/2015  . Diabetes mellitus with hyperglycemia ([place]) 03/31/2013  . Hyponatremia 03/23/2013  . Pulmonary embolism ([place]) 05/05/2012  . Morbid obesity with body mass index of 40.0-44.9 in adult [facility]) 05/05/2012  . Obstructive sleep apnea 05/05/2012  . DVT of left distal popliteal vein 05/05/2012   Past Medical History:  Diagnosis Date  . Diabetes mellitus, type II ([place]) 02/2013  . Diabetic ulcer of right foot due to type 2 diabetes mellitus ([place]) 03/21/2018  . DVT (deep venous thrombosis) ([place]) 05/04/2012   LLE  . Exertional dyspnea 05/04/2012   "isolated episode" (05/05/2012)    . Hepatic steatosis   . History of chickenpox   . Hypertension 03/21/2018  . Obesity   . Obesity, Class III, BMI 40-49.9 (morbid obesity) ([place]) 03/21/2018  . OSA on CPAP   . Peripheral vascular disease (HCC)    varicose veins  . Pulmonary embolism ([place]) 05/04/2012   bilaterally  . Pyelonephritis 04/2017  . Small bowel obstruction ([place]) 09/06/2015  . Varicose veins     Family History  Problem Relation Age of Onset  . Diabetes Mother        died age 93- also bleeding ulcer  . Hypertension Mother   . Heart failure Father        in his 60s (patient was 51). day before surgery planned  . Other Maternal Grandmother        states natural causes all grandparents    Past Surgical History:  Procedure Laterality Date  . CYSTOSCOPY W/ URETERAL STENT PLACEMENT Right 05/12/2017   Procedure: CYSTOSCOPY WITH RETROGRADE PYELOGRAM/URETERAL STENT PLACEMENT;  Surgeon: [name], MD;  Location: [facility];  Service: Urology;  Laterality: Right;  . CYSTOSCOPY/URETEROSCOPY/HOLMIUM LASER/STENT PLACEMENT Right 07/29/2017   Procedure: CYSTOSCOPY RIGHT URETEROSCOPY HOLMIUM LASER STENT EXCHANGE;  Surgeon: [name], MD;  [facility];  Service: Urology;  Laterality: Right;  . HERNIA REPAIR     2015  . LAPAROTOMY N/A 12/13/2013   Procedure: EXPLORATORY LAPAROTOMY Repair ventral hernia, without mesh, partial omentectomy;  Surgeon: Gwenyth Ober, MD;  Location: [facility];  Service: General;  Laterality: N/A;  . VENTRAL HERNIA REPAIR N/A 04/26/2017   Procedure: INCARCERATED HERNIA REPAIR VENTRAL ADULT;  Surgeon: Georganna Skeans, MD;  Location: [facility];  Service: General;  Laterality: N/A;   Social History   Occupational History  . 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 Smoking status: Never Smoker  . Smokeless tobacco: Never Used  Substance and Sexual Activity  . Alcohol use: No    Alcohol/week: 0.0 standard drinks  . Drug use: No  . Sexual  activity: Not Currently

## 2018-05-25 ENCOUNTER — Encounter (INDEPENDENT_AMBULATORY_CARE_PROVIDER_SITE_OTHER): Payer: Self-pay | Admitting: Physician Assistant

## 2018-05-25 ENCOUNTER — Ambulatory Visit (INDEPENDENT_AMBULATORY_CARE_PROVIDER_SITE_OTHER): Payer: PRIVATE HEALTH INSURANCE | Admitting: Physician Assistant

## 2018-05-25 VITALS — Ht 74.5 in | Wt 341.2 lb

## 2018-05-25 DIAGNOSIS — E1142 Type 2 diabetes mellitus with diabetic polyneuropathy: Secondary | ICD-10-CM

## 2018-05-25 DIAGNOSIS — I87323 Chronic venous hypertension (idiopathic) with inflammation of bilateral lower extremity: Secondary | ICD-10-CM

## 2018-05-25 DIAGNOSIS — I2699 Other pulmonary embolism without acute cor pulmonale: Secondary | ICD-10-CM

## 2018-05-25 DIAGNOSIS — I82432 Acute embolism and thrombosis of left popliteal vein: Secondary | ICD-10-CM

## 2018-05-25 NOTE — Progress Notes (Signed)
Office Visit Note   [name]           Date of Birth: July 15, 1960           MRN: [id number] Visit Date: 05/25/2018              Requested by: [name], MD [address] [place], [place] [address] PCP: [name], MD  Chief Complaint  Patient presents with  . Right Leg - Follow-up      HPI: The patient is a 57 yo gentleman who is seen for follow up of bilateral lower leg chronic venous insufficiency and ulceration of the right lower leg. The ulcer of the right leg has healed. He is using Vive compression sock except for showering and using a sleeve during the daytime for edema control. He is using Aquaphor for hydration and this has improved the dryness significantly.   Assessment & Plan: Visit Diagnoses:  1. Chronic venous hypertension (idiopathic) with inflammation of bilateral lower extremity   2. Type 2 diabetes mellitus with diabetic polyneuropathy, without long-term current use of insulin ([place])   3. Deep vein thrombosis (DVT) of popliteal vein of left lower extremity, unspecified chronicity (HCC)   4. Other pulmonary embolism without acute cor pulmonale, unspecified chronicity (HCC)     Plan: Continue the Vive compression sock with sleeve during the daytime. Aquaphor for hydration. Discussed he needs to continue daily observation of bilateral feet and legs to monitor for ulcers, calluses or other signs of breakdown. He should follow up at any signs of breakdown. He will otherwise follow up prn for any issues.   Follow-Up Instructions: Return if symptoms worsen or fail to improve.   Ortho Exam  Patient is alert, oriented, no adenopathy, well-dressed, normal affect, normal respiratory effort. The right lower leg edema remains markedly improved. There is hemosiderin staining circumferentially over the mid and lower calf. No ulcers and improved xerosis of skin over the calf and heel. Good pedal pulses.  Using compression on the left lower leg as  well and reports no problems with this leg today.  Imaging: No results found. No images are attached to the encounter.  Labs: Lab Results  Component Value Date   HGBA1C 7.0 (H) 03/21/2018   HGBA1C 6.4 (A) 02/09/2018   HGBA1C 6.7 (H) 06/18/2017   ESRSEDRATE 28 (H) 03/21/2018   CRP <0.8 03/21/2018   REPTSTATUS 03/26/2018 FINAL 03/21/2018   GRAMSTAIN  03/21/2018    NO WBC SEEN NO ORGANISMS SEEN Performed at [facility], [place] [address], [place], [place] [address]    CULT  03/21/2018    NO GROWTH 5 DAYS Performed at [facility] [address] [place], [place] [address]    LABORGA METHICILLIN RESISTANT STAPHYLOCOCCUS AUREUS 03/21/2018     Lab Results  Component Value Date   ALBUMIN 3.4 (L) 03/21/2018   ALBUMIN 3.9 02/09/2018   ALBUMIN 3.6 06/18/2017   PREALBUMIN 15.8 (L) 03/21/2018    Body mass index is 43.22 kg/m.  Orders:  No orders of the defined types were placed in this encounter.  No orders of the defined types were placed in this encounter.    Procedures: No procedures performed  Clinical Data: No additional findings.  ROS:  All other systems negative, except as noted in the HPI. Review of Systems  Objective: Vital Signs: Ht 6' 2.5" (1.892 m)   Wt (!) 341 lb 3.2 oz (154.8 kg)   BMI 43.22 kg/m  Specialty Comments:  No specialty comments available.  PMFS History: Patient Active Problem List   Diagnosis Date Noted  . Diabetic ulcer of right foot due to type 2 diabetes mellitus ([place]) 03/21/2018  . Atypical chest pain 02/09/2018  . Renal stone 05/12/2017  . S/P repair of recurrent ventral hernia 04/26/2017  . Chronic venous insufficiency 01/25/2015  . Diabetes mellitus with hyperglycemia ([place]) 03/31/2013  . Hyponatremia 03/23/2013  . Pulmonary embolism ([place]) 05/05/2012  . Morbid obesity with body mass index of 40.0-44.9 in adult [facility]) 05/05/2012  . Obstructive sleep apnea 05/05/2012  . DVT of left distal popliteal vein  05/05/2012   Past Medical History:  Diagnosis Date  . Diabetes mellitus, type II ([place]) 02/2013  . Diabetic ulcer of right foot due to type 2 diabetes mellitus ([place]) 03/21/2018  . DVT (deep venous thrombosis) ([place]) 05/04/2012   LLE  . Exertional dyspnea 05/04/2012   "isolated episode" (05/05/2012)  . Hepatic steatosis   . History of chickenpox   . Hypertension 03/21/2018  . Obesity   . Obesity, Class III, BMI 40-49.9 (morbid obesity) ([place]) 03/21/2018  . OSA on CPAP   . Peripheral vascular disease (HCC)    varicose veins  . Pulmonary embolism ([place]) 05/04/2012   bilaterally  . Pyelonephritis 04/2017  . Small bowel obstruction ([place]) 09/06/2015  . Varicose veins     Family History  Problem Relation Age of Onset  . Diabetes Mother        died age 40- also bleeding ulcer  . Hypertension Mother   . Heart failure Father        in his 85s (patient was 51). day before surgery planned  . Other Maternal Grandmother        states natural causes all grandparents    Past Surgical History:  Procedure Laterality Date  . CYSTOSCOPY W/ URETERAL STENT PLACEMENT Right 05/12/2017   Procedure: CYSTOSCOPY WITH RETROGRADE PYELOGRAM/URETERAL STENT PLACEMENT;  Surgeon: [name], MD;  Location: [facility];  Service: Urology;  Laterality: Right;  . CYSTOSCOPY/URETEROSCOPY/HOLMIUM LASER/STENT PLACEMENT Right 07/29/2017   Procedure: CYSTOSCOPY RIGHT URETEROSCOPY HOLMIUM LASER STENT EXCHANGE;  Surgeon: [name], MD;  [facility];  Service: Urology;  Laterality: Right;  . HERNIA REPAIR     2015  . LAPAROTOMY N/A 12/13/2013   Procedure: EXPLORATORY LAPAROTOMY Repair ventral hernia, without mesh, partial omentectomy;  Surgeon: Gwenyth Ober, MD;  Location: [facility];  Service: General;  Laterality: N/A;  . VENTRAL HERNIA REPAIR N/A 04/26/2017   Procedure: INCARCERATED HERNIA REPAIR VENTRAL ADULT;  Surgeon: Georganna Skeans, MD;  Location: [facility];  Service: General;  Laterality: N/A;   Social History    Occupational History  . 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 Smoking status: Never Smoker  . Smokeless tobacco: Never Used  Substance and Sexual Activity  . Alcohol use: No    Alcohol/week: 0.0 standard drinks  . Drug use: No  . Sexual activity: Not Currently

## 2018-06-23 ENCOUNTER — Other Ambulatory Visit: Payer: Self-pay | Admitting: Family Medicine

## 2018-07-06 ENCOUNTER — Encounter: Payer: PRIVATE HEALTH INSURANCE | Admitting: Family Medicine

## 2018-08-19 ENCOUNTER — Inpatient Hospital Stay (HOSPITAL_COMMUNITY)
Admission: EM | Admit: 2018-08-19 | Discharge: 2018-08-22 | DRG: 394 | Disposition: A | Discharge status: Home / Self Care | Payer: PRIVATE HEALTH INSURANCE | Attending: Surgery | Admitting: Surgery

## 2018-08-19 ENCOUNTER — Encounter (HOSPITAL_COMMUNITY): Payer: Self-pay | Admitting: Emergency Medicine

## 2018-08-19 DIAGNOSIS — I10 Essential (primary) hypertension: Secondary | ICD-10-CM | POA: Diagnosis present

## 2018-08-19 DIAGNOSIS — K56609 Unspecified intestinal obstruction, unspecified as to partial versus complete obstruction: Secondary | ICD-10-CM

## 2018-08-19 DIAGNOSIS — K430 Incisional hernia with obstruction, without gangrene: Secondary | ICD-10-CM | POA: Diagnosis not present

## 2018-08-19 DIAGNOSIS — K760 Fatty (change of) liver, not elsewhere classified: Secondary | ICD-10-CM | POA: Diagnosis present

## 2018-08-19 DIAGNOSIS — K460 Unspecified abdominal hernia with obstruction, without gangrene: Secondary | ICD-10-CM

## 2018-08-19 DIAGNOSIS — Z7984 Long term (current) use of oral hypoglycemic drugs: Secondary | ICD-10-CM

## 2018-08-19 DIAGNOSIS — I2510 Atherosclerotic heart disease of native coronary artery without angina pectoris: Secondary | ICD-10-CM | POA: Diagnosis present

## 2018-08-19 DIAGNOSIS — Z6841 Body Mass Index (BMI) 40.0 and over, adult: Secondary | ICD-10-CM

## 2018-08-19 DIAGNOSIS — Z86711 Personal history of pulmonary embolism: Secondary | ICD-10-CM

## 2018-08-19 DIAGNOSIS — Z8719 Personal history of other diseases of the digestive system: Secondary | ICD-10-CM | POA: Diagnosis present

## 2018-08-19 DIAGNOSIS — Z86718 Personal history of other venous thrombosis and embolism: Secondary | ICD-10-CM

## 2018-08-19 DIAGNOSIS — G4733 Obstructive sleep apnea (adult) (pediatric): Secondary | ICD-10-CM | POA: Diagnosis present

## 2018-08-19 DIAGNOSIS — E119 Type 2 diabetes mellitus without complications: Secondary | ICD-10-CM | POA: Diagnosis present

## 2018-08-19 DIAGNOSIS — Z7901 Long term (current) use of anticoagulants: Secondary | ICD-10-CM

## 2018-08-19 DIAGNOSIS — R1033 Periumbilical pain: Secondary | ICD-10-CM | POA: Diagnosis not present

## 2018-08-19 DIAGNOSIS — Z23 Encounter for immunization: Secondary | ICD-10-CM

## 2018-08-19 MED ORDER — FENTANYL CITRATE (PF) 100 MCG/2ML IJ SOLN
100.0000 ug | Freq: Once | INTRAMUSCULAR | Status: AC
  Administered 2018-08-19: 100 ug via INTRAVENOUS
  Filled 2018-08-19: qty 2

## 2018-08-19 MED ORDER — ONDANSETRON HCL 4 MG/2ML IJ SOLN
4.0000 mg | Freq: Once | INTRAMUSCULAR | Status: AC
  Administered 2018-08-19: 4 mg via INTRAVENOUS
  Filled 2018-08-19: qty 2

## 2018-08-19 NOTE — ED Triage Notes (Signed)
  Patient comes in with abdominal pain that has been going on for 2 days.  Patient states the pain is in his lower abdomen and comes up to the middle of his abdomen.  Patient has hx of hernia repair and states his dog was jumping on him yesterday and it started to feel weird.  Patient has been nauseous but no vomiting.  Pain 2/10 currently.  Patient A&O x4.

## 2018-08-20 ENCOUNTER — Other Ambulatory Visit: Payer: Self-pay

## 2018-08-20 ENCOUNTER — Emergency Department (HOSPITAL_COMMUNITY): Payer: PRIVATE HEALTH INSURANCE

## 2018-08-20 ENCOUNTER — Inpatient Hospital Stay (HOSPITAL_COMMUNITY): Payer: PRIVATE HEALTH INSURANCE

## 2018-08-20 ENCOUNTER — Encounter (HOSPITAL_COMMUNITY): Payer: Self-pay | Admitting: Radiology

## 2018-08-20 DIAGNOSIS — K56609 Unspecified intestinal obstruction, unspecified as to partial versus complete obstruction: Secondary | ICD-10-CM | POA: Diagnosis present

## 2018-08-20 DIAGNOSIS — R1033 Periumbilical pain: Secondary | ICD-10-CM | POA: Diagnosis present

## 2018-08-20 DIAGNOSIS — Z86718 Personal history of other venous thrombosis and embolism: Secondary | ICD-10-CM | POA: Diagnosis not present

## 2018-08-20 DIAGNOSIS — K430 Incisional hernia with obstruction, without gangrene: Secondary | ICD-10-CM | POA: Diagnosis present

## 2018-08-20 DIAGNOSIS — Z8719 Personal history of other diseases of the digestive system: Secondary | ICD-10-CM | POA: Diagnosis present

## 2018-08-20 DIAGNOSIS — I2510 Atherosclerotic heart disease of native coronary artery without angina pectoris: Secondary | ICD-10-CM | POA: Diagnosis present

## 2018-08-20 DIAGNOSIS — Z7984 Long term (current) use of oral hypoglycemic drugs: Secondary | ICD-10-CM | POA: Diagnosis not present

## 2018-08-20 DIAGNOSIS — G4733 Obstructive sleep apnea (adult) (pediatric): Secondary | ICD-10-CM | POA: Diagnosis present

## 2018-08-20 DIAGNOSIS — E119 Type 2 diabetes mellitus without complications: Secondary | ICD-10-CM | POA: Diagnosis present

## 2018-08-20 DIAGNOSIS — Z7901 Long term (current) use of anticoagulants: Secondary | ICD-10-CM | POA: Diagnosis not present

## 2018-08-20 DIAGNOSIS — Z86711 Personal history of pulmonary embolism: Secondary | ICD-10-CM | POA: Diagnosis not present

## 2018-08-20 DIAGNOSIS — I10 Essential (primary) hypertension: Secondary | ICD-10-CM | POA: Diagnosis present

## 2018-08-20 DIAGNOSIS — Z23 Encounter for immunization: Secondary | ICD-10-CM | POA: Diagnosis not present

## 2018-08-20 DIAGNOSIS — K760 Fatty (change of) liver, not elsewhere classified: Secondary | ICD-10-CM | POA: Diagnosis present

## 2018-08-20 DIAGNOSIS — Z6841 Body Mass Index (BMI) 40.0 and over, adult: Secondary | ICD-10-CM | POA: Diagnosis not present

## 2018-08-20 LAB — COMPREHENSIVE METABOLIC PANEL
ALT: 20 U/L (ref 0–44)
AST: 21 U/L (ref 15–41)
Albumin: 4 g/dL (ref 3.5–5.0)
Alkaline Phosphatase: 68 U/L (ref 38–126)
Anion gap: 13 (ref 5–15)
BUN: 18 mg/dL (ref 6–20)
CO2: 22 mmol/L (ref 22–32)
Calcium: 9.5 mg/dL (ref 8.9–10.3)
Chloride: 104 mmol/L (ref 98–111)
Creatinine, Ser: 1.43 mg/dL — ABNORMAL HIGH (ref 0.61–1.24)
GFR calc Af Amer: >60 mL/min (ref >60)
GFR calc non Af Amer: 53 mL/min — ABNORMAL LOW (ref >60)
Glucose, Bld: 175 mg/dL — ABNORMAL HIGH (ref 70–99)
Potassium: 3.9 mmol/L (ref 3.5–5.1)
Sodium: 139 mmol/L (ref 135–145)
Total Bilirubin: 1 mg/dL (ref 0.3–1.2)
Total Protein: 8.8 g/dL — ABNORMAL HIGH (ref 6.5–8.1)

## 2018-08-20 LAB — BASIC METABOLIC PANEL
Anion gap: 5 (ref 5–15)
BUN: 18 mg/dL (ref 6–20)
CO2: 24 mmol/L (ref 22–32)
Calcium: 8.7 mg/dL — ABNORMAL LOW (ref 8.9–10.3)
Chloride: 109 mmol/L (ref 98–111)
Creatinine, Ser: 1.27 mg/dL — ABNORMAL HIGH (ref 0.61–1.24)
GFR calc Af Amer: >60 mL/min (ref >60)
GFR calc non Af Amer: >60 mL/min (ref >60)
Glucose, Bld: 150 mg/dL — ABNORMAL HIGH (ref 70–99)
Potassium: 4.4 mmol/L (ref 3.5–5.1)
Sodium: 138 mmol/L (ref 135–145)

## 2018-08-20 LAB — CBC WITH DIFFERENTIAL/PLATELET
Abs Immature Granulocytes: 0.06 10*3/uL (ref 0.00–0.07)
Basophils Absolute: 0.1 10*3/uL (ref 0.0–0.1)
Basophils Relative: 0 %
Eosinophils Absolute: 0.2 10*3/uL (ref 0.0–0.5)
Eosinophils Relative: 1 %
HCT: 46.7 % (ref 39.0–52.0)
Hemoglobin: 14.7 g/dL (ref 13.0–17.0)
Immature Granulocytes: 0 %
Lymphocytes Relative: 12 %
Lymphs Abs: 1.9 10*3/uL (ref 0.7–4.0)
MCH: 27.3 pg (ref 26.0–34.0)
MCHC: 31.5 g/dL (ref 30.0–36.0)
MCV: 86.6 fL (ref 80.0–100.0)
Monocytes Absolute: 1.3 10*3/uL — ABNORMAL HIGH (ref 0.1–1.0)
Monocytes Relative: 8 %
Neutro Abs: 12.9 10*3/uL — ABNORMAL HIGH (ref 1.7–7.7)
Neutrophils Relative %: 79 %
Platelets: 242 10*3/uL (ref 150–400)
RBC: 5.39 MIL/uL (ref 4.22–5.81)
RDW: 15 % (ref 11.5–15.5)
WBC: 16.4 10*3/uL — ABNORMAL HIGH (ref 4.0–10.5)
nRBC: 0 % (ref 0.0–0.2)

## 2018-08-20 LAB — GLUCOSE, CAPILLARY
Glucose-Capillary: 109 mg/dL — ABNORMAL HIGH (ref 70–99)
Glucose-Capillary: 137 mg/dL — ABNORMAL HIGH (ref 70–99)
Glucose-Capillary: 168 mg/dL — ABNORMAL HIGH (ref 70–99)
Glucose-Capillary: 90 mg/dL (ref 70–99)
Glucose-Capillary: 98 mg/dL (ref 70–99)
Glucose-Capillary: 98 mg/dL (ref 70–99)

## 2018-08-20 LAB — CBC
HCT: 40.4 % (ref 39.0–52.0)
Hemoglobin: 12.6 g/dL — ABNORMAL LOW (ref 13.0–17.0)
MCH: 27.3 pg (ref 26.0–34.0)
MCHC: 31.2 g/dL (ref 30.0–36.0)
MCV: 87.4 fL (ref 80.0–100.0)
Platelets: 204 10*3/uL (ref 150–400)
RBC: 4.62 MIL/uL (ref 4.22–5.81)
RDW: 15.3 % (ref 11.5–15.5)
WBC: 9.8 10*3/uL (ref 4.0–10.5)
nRBC: 0 % (ref 0.0–0.2)

## 2018-08-20 LAB — HEPARIN LEVEL (UNFRACTIONATED)
Heparin Unfractionated: <0.1 IU/mL — ABNORMAL LOW (ref 0.30–0.70)
Heparin Unfractionated: 0.29 IU/mL — ABNORMAL LOW (ref 0.30–0.70)

## 2018-08-20 LAB — LIPASE, BLOOD: Lipase: 21 U/L (ref 11–51)

## 2018-08-20 LAB — MRSA PCR SCREENING: MRSA by PCR: NEGATIVE

## 2018-08-20 LAB — APTT: aPTT: 29 seconds (ref 24–36)

## 2018-08-20 MED ORDER — HYDROMORPHONE HCL 1 MG/ML IJ SOLN: 0.5000 mg | INTRAMUSCULAR | Status: DC | PRN

## 2018-08-20 MED ORDER — METOPROLOL TARTRATE 5 MG/5ML IV SOLN: 5.0000 mg | Freq: Four times a day (QID) | INTRAVENOUS | Status: DC | PRN

## 2018-08-20 MED ORDER — HEPARIN (PORCINE) 25000 UT/250ML-% IV SOLN
2200.0000 [IU]/h | INTRAVENOUS | Status: DC
  Administered 2018-08-20: 2200 [IU]/h via INTRAVENOUS
  Administered 2018-08-20: 2100 [IU]/h via INTRAVENOUS
  Administered 2018-08-21 (×2): 2200 [IU]/h via INTRAVENOUS
  Filled 2018-08-20 (×6): qty 250

## 2018-08-20 MED ORDER — IOHEXOL 300 MG/ML  SOLN
100.0000 mL | Freq: Once | INTRAMUSCULAR | Status: AC | PRN
  Administered 2018-08-20: 100 mL via INTRAVENOUS

## 2018-08-20 MED ORDER — INFLUENZA VAC SPLIT QUAD 0.5 ML IM SUSY
0.5000 mL | PREFILLED_SYRINGE | INTRAMUSCULAR | Status: AC
  Administered 2018-08-21: 0.5 mL via INTRAMUSCULAR
  Filled 2018-08-20: qty 0.5

## 2018-08-20 MED ORDER — ONDANSETRON 4 MG PO TBDP: 4.0000 mg | ORAL_TABLET | Freq: Four times a day (QID) | ORAL | Status: DC | PRN

## 2018-08-20 MED ORDER — SODIUM CHLORIDE 0.9 % IV BOLUS (SEPSIS)
500.0000 mL | Freq: Once | INTRAVENOUS | Status: AC
  Administered 2018-08-20: 500 mL via INTRAVENOUS

## 2018-08-20 MED ORDER — HYDRALAZINE HCL 20 MG/ML IJ SOLN: 10.0000 mg | INTRAMUSCULAR | Status: DC | PRN

## 2018-08-20 MED ORDER — ONDANSETRON HCL 4 MG/2ML IJ SOLN: 4.0000 mg | Freq: Four times a day (QID) | INTRAMUSCULAR | Status: DC | PRN

## 2018-08-20 MED ORDER — INSULIN ASPART 100 UNIT/ML ~~LOC~~ SOLN
0.0000 [IU] | SUBCUTANEOUS | Status: DC
  Administered 2018-08-20: 3 [IU] via SUBCUTANEOUS
  Administered 2018-08-20: 4 [IU] via SUBCUTANEOUS
  Administered 2018-08-21: 3 [IU] via SUBCUTANEOUS

## 2018-08-20 MED ORDER — SODIUM CHLORIDE 0.9 % IV SOLN
INTRAVENOUS | Status: DC
  Administered 2018-08-20 – 2018-08-21 (×6): via INTRAVENOUS

## 2018-08-20 NOTE — H&P (Signed)
Surgical H&P  CC: abdominal pain  HPI: 58 year old man with history of diabetes, DVT/ PE (2013) on Eliquis, hypertension, obesity, obstructive sleep apnea on CPAP, who presents to the [facility] emergency department earlier this evening with worsening periumbilical abdominal pain. The pain began on Sunday evening and he noticed that after he was playing with his 45 pound puppy and the dog stepped on his abdomen. The pain somewhat abated after that but he continued to have intermittent spells of pain. He states that he continued to have normal bowel movements up until most recently 11:00 this morning. This afternoon he started to develop nausea and his pain crescendoed worsen if it ever been and therefore he came to the emergency department. Currently he is feeling better. CT scan as listed below demonstrates likely small bowel obstruction originating with a chronically incarcerated recurrent ventral hernia.  His surgical history is notable for a exploratory laparotomy in 2015 by Dr. Wyatt for incarcerated umbilical hernia, which contained fat and nonischemic bowel and was primarily repaired with Novafil sutures. Subsequently the hernia recurred/ incarcerated and he underwent a second, also emergent repair in 2018 by Dr. Thompson, again primarily with interrupted Novafil sutures.  No Known Allergies  Past Medical History:  Diagnosis Date  . Diabetes mellitus, type II (HCC) 02/2013  . Diabetic ulcer of right foot due to type 2 diabetes mellitus (HCC) 03/21/2018  . DVT (deep venous thrombosis) (HCC) 05/04/2012   LLE  . Exertional dyspnea 05/04/2012   "isolated episode" (05/05/2012)  . Hepatic steatosis   . History of chickenpox   . Hypertension 03/21/2018  . Obesity   . Obesity, Class III, BMI 40-49.9 (morbid obesity) (HCC) 03/21/2018  . OSA on CPAP   . Peripheral vascular disease (HCC)    varicose veins  . Pulmonary embolism (HCC) 05/04/2012   bilaterally  . Pyelonephritis 04/2017  . Small  bowel obstruction (HCC) 09/06/2015  . Varicose veins     Past Surgical History:  Procedure Laterality Date  . CYSTOSCOPY W/ URETERAL STENT PLACEMENT Right 05/12/2017   Procedure: CYSTOSCOPY WITH RETROGRADE PYELOGRAM/URETERAL STENT PLACEMENT;  Surgeon: Wrenn, John, MD;  [facility];  Service: Urology;  Laterality: Right;  . CYSTOSCOPY/URETEROSCOPY/HOLMIUM LASER/STENT PLACEMENT Right 07/29/2017   Procedure: CYSTOSCOPY RIGHT URETEROSCOPY HOLMIUM LASER STENT EXCHANGE;  Surgeon: Wrenn, John, MD;  [facility];  Service: Urology;  Laterality: Right;  . HERNIA REPAIR     20 15  . LAPAROTOMY N/A 12/13/2013   Procedure: EXPLORATORY LAPAROTOMY Repair ventral hernia, without mesh, partial omentectomy;  Surgeon: Gwenyth Ober, MD;  Location: [facility];  Service: General;  Laterality: N/A;  . VENTRAL HERNIA REPAIR N/A 04/26/2017   Procedure: INCARCERATED HERNIA REPAIR VENTRAL ADULT;  Surgeon: Georganna Skeans, MD;  Location: [facility];  Service: General;  Laterality: N/A;    Family History  Problem Relation Age of Onset  . Diabetes Mother        died age 41- also bleeding ulcer  . Hypertension Mother   . Heart failure Father        in his 40s (patient was 49). day before surgery planned  . Other Maternal Grandmother        states natural causes all grandparents    Social History   Socioeconomic History  . Marital status: Single    Spouse name: Not on file  . Number of children: Not on file  . Years of education: Not on file  . Highest education level: Not on file  Occupational History  .  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 Financial resource strain: Not on file  . Food insecurity:    Worry: Not on file    Inability: Not on file  . Transportation needs:    Medical: Not on file    Non-medical: Not on file  Tobacco Use  . Smoking status: Never Smoker  . Smokeless tobacco: Never Used  Substance and Sexual Activity  . Alcohol  use: No    Alcohol/week: 0.0 standard drinks  . Drug use: No  . Sexual activity: Not Currently  Lifestyle  . Physical activity:    Days per week: Not on file    Minutes per session: Not on file  . Stress: Not on file  Relationships  . Social connections:    Talks on phone: Not on file    Gets together: Not on file    Attends religious service: Not on file    Active member of club or organization: Not on file    Attends meetings of clubs or organizations: Not on file    Relationship status: Not on file  Other Topics Concern  . Not on file  Social History Narrative   Single. Lives alone. Friends that live close. No pets.       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       Hobbies: golf, basketball, walking   Tired from work right now    No current facility-administered medications on file prior to encounter.    Current Outpatient Medications on File Prior to Encounter  Medication Sig Dispense Refill  . ELIQUIS 5 MG TABS tablet TAKE 1 TABLET BY MOUTH TWICE A DAY 180 tablet 1  . furosemide (LASIX) 40 MG tablet Take 1 tablet (40 mg total) by mouth daily as needed for fluid or edema. 30 tablet 2  . HYDROcodone-acetaminophen (NORCO/VICODIN) 5-325 MG tablet Take 0.5-1 tablets by mouth every 8 (eight) hours as needed for moderate pain or severe pain. 12 tablet 0  . lisinopril (PRINIVIL,ZESTRIL) 2.5 MG tablet TAKE 1 TABLET BY MOUTH EVERY DAY 90 tablet 3  . metFORMIN (GLUCOPHAGE) 500 MG tablet Take 2 tablets (1,000 mg total) by mouth 2 (two) times daily with a meal. 120 tablet 5  . Multiple Vitamin (MULTIVITAMIN WITH MINERALS) TABS tablet Take 1 tablet by mouth daily.     . mupirocin ointment (BACTROBAN) 2 % Place 1 application into the nose 2 (two) times daily. May also apply to open areas right leg wound 22 g 0    Review of Systems: a complete, 10pt review of systems was completed with pertinent positives and negatives as documented in the HPI  Physical Exam: Vitals:    08/20/18 0045 08/20/18 0145  BP: 138/80 (!) 116/98  Pulse: 99 (!) 101  Resp: 19   Temp:    SpO2: 92% 94%   Gen: A&Ox3, no distress  Head: normocephalic, atraumatic Eyes: extraocular motions intact, anicteric.  Neck: supple without mass or thyromegaly Chest: unlabored respirations, symmetrical air entry, clear bilaterally   Cardiovascular: RRR with palpable distal pulses, no pedal edema Abdomen: obese, soft, nontender. Bowel sounds audible with palpation over the mid-abdomen but this is only mildly tender. No mass or organomegaly.  Extremities: warm, without edema, no deformities  Neuro: grossly intact Psych: appropriate mood and affect, normal insight  Skin: warm and dry   CBC Latest Ref Rng & Units 08/19/2018 03/24/2018 03/23/2018  WBC 4.0 - 10.5  K/uL 16.4(H) 11.6(H) 10.8(H)  Hemoglobin 13.0 - 17.0 g/dL 14.7 12.1(L) 11.6(L)  Hematocrit 39.0 - 52.0 % 46.7 39.0 38.2(L)  Platelets 150 - 400 K/uL 242 247 223    CMP Latest Ref Rng & Units 08/19/2018 03/24/2018 03/23/2018  Glucose 70 - 99 mg/dL 175(H) 130(H) 141(H)  BUN 6 - 20 mg/dL 18 17 16   Creatinine 0.61 - 1.24 mg/dL 1.43(H) 1.21 1.19  Sodium 135 - 145 mmol/L 139 134(L) 134(L)  Potassium 3.5 - 5.1 mmol/L 3.9 4.0 4.1  Chloride 98 - 111 mmol/L 104 103 104  CO2 22 - 32 mmol/L 22 22 24   Calcium 8.9 - 10.3 mg/dL 9.5 8.6(L) 8.5(L)  Total Protein 6.5 - 8.1 g/dL 8.8(H) - -  Total Bilirubin 0.3 - 1.2 mg/dL 1.0 - -  Alkaline Phos 38 - 126 U/L 68 - -  AST 15 - 41 U/L 21 - -  ALT 0 - 44 U/L 20 - -    Lab Results  Component Value Date   INR 1.15 06/16/2015   INR 1.17 12/14/2013   INR 1.20 05/07/2012    Imaging: Ct Abdomen Pelvis W Contrast  Result Date: 08/20/2018 CLINICAL DATA:  58 year old male with lower abdominal pain and nausea. EXAM: CT ABDOMEN AND PELVIS WITH CONTRAST TECHNIQUE: Multidetector CT imaging of the abdomen and pelvis was performed using the standard protocol following bolus administration of intravenous  contrast. CONTRAST:  133mL OMNIPAQUE IOHEXOL 300 MG/ML  SOLN COMPARISON:  CT Abdomen and Pelvis 05/12/2017. FINDINGS: Lower chest: Negative lung bases. No pericardial or pleural effusion. Calcified coronary artery atherosclerosis is evident on series 5, image 1. Hepatobiliary: Evidence of hepatic steatosis. Negative gallbladder. Pancreas: Atrophied, otherwise negative. Spleen: Negative. Adrenals/Urinary Tract: Normal adrenal glands. Left greater than right nephrolithiasis. No hydronephrosis or hydroureter. Symmetric renal enhancement and contrast excretion. Unremarkable urinary bladder. Stomach/Bowel: Sigmoid redundancy and diverticulosis. A portion of the sigmoid colon is contained within the complex ventral abdominal hernia (on series 3, image 84), however, the herniated sigmoid does not appear incarcerated and there is no upstream large bowel dilatation. Diverticulosis at the hepatic flexure. No large bowel inflammation. Negative appendix. Decompressed and negative terminal ileum. Complex, lobulated, 8 x 18 x 13 centimeter ventral abdominal hernia) (AP by transverse by CC) containing mesentery and mostly small bowel. A dilated loop of small bowel enters the right caudal aspect of the hernia on series 3, image 74 and appears incarcerated and mildly inflamed within the hernia sac. Small bowel distal to this is decompressed. Other nondilated small bowel loops are also contained within the hernia sac, mostly to the left of midline. There are borderline dilated fluid and air containing small bowel loops along the cephalad aspect of the hernia. The stomach is mildly dilated. The duodenum and proximal jejunum are decompressed. No free air. No free fluid. Vascular/Lymphatic: Major arterial structures appear patent. Mild Aortoiliac calcified atherosclerosis. Patent portal venous system. No lymphadenopathy. Reproductive: Negative. Other: No pelvic free fluid. Musculoskeletal: No acute osseous abnormality identified.  IMPRESSION: 1. Small-bowel obstruction secondary to incarcerated loops within a complex and lobulated ventral abdominal hernia encompassing 8 x 18 x 13 cm. There are also non-incarcerated loops of small bowel and a small portion of the sigmoid colon within the hernia. 2. No free air or free fluid. 3. Hepatic steatosis.  Calcified coronary artery atherosclerosis. Electronically Signed   By: Genevie Ann M.D.   On: 08/20/2018 01:37    A/P: Incarcerated re-recurrent ventral hernia with partial SBO.  -Admit for bowel rest, fluid  resuscitation, serial abdominal exams -If nausea returns or pain worsens will need NG tube -Hold eliquis for now. Discussed if symptoms persist may need surgical intervention which entails risk of bowel injury or resection, fistula, ileus, and nearly certain recurrence of this hernia given habitus.   Romana Juniper, MD [facility] Surgery, [place] Pager [phone]

## 2018-08-20 NOTE — Plan of Care (Signed)

## 2018-08-20 NOTE — Progress Notes (Addendum)
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 Admitted for bowel rest and possible surgery. Pharmacy consulted to hold apixaban and start heparin. Only on SCDs currently inpatient. Hg 12.6, plt wnl. No active bleed issues documented. Will monitor aPTT while apixaban expected to influence heparin level.  Goal of Therapy:  Heparin level 0.3-0.7 units/ml aPTT 66-102 seconds Monitor platelets by anticoagulation protocol: Yes   Plan:  Hold apixaban. Check baseline aPTT/heparin level No bolus with recent apixaban. Start heparin at 2100 units/hr - ok to start now as has been >12hrs since last apixaban dose 6h aPTT Monitor daily aPTT/heparin level/CBC, s/sx bleeding F/u Surgery plans  Elicia Lamp, PharmD, BCPS Clinical Pharmacist 08/20/2018 12:12 PM   ADDENDUM:  Baseline aPTT 29 and baseline heparin level resulted as undetectable - would expect this to be elevated if patient compliant with apixaban PTA. Last fill date in [place] documented was 04/27/2018 for 30-day supply. Will d/c APTT and monitor heparin levels only.  Elicia Lamp, PharmD, BCPS Please check AMION for all [address] contact numbers Clinical Pharmacist 08/20/2018 3:04 PM

## 2018-08-20 NOTE — ED Notes (Signed)
Gave pt urinal for urine sample

## 2018-08-20 NOTE — ED Notes (Signed)
ED TO INPATIENT HANDOFF REPORT  ED Nurse Name and Phone #:  Lenice Pressman [phone]  S Name/Age/Gender Evan Leonard 58 y.o. male Room/Bed: 020C/020C  Code Status   Code Status: Full Code  Home/SNF/Other Home Patient oriented to: self, place, time and situation Is this baseline? Yes   Triage Complete: Triage complete  Chief Complaint Stomach pain, CP  Triage Note  Patient comes in with abdominal pain that has been going on for 2 days.  Patient states the pain is in his lower abdomen and comes up to the middle of his abdomen.  Patient has hx of hernia repair and states his dog was jumping on him yesterday and it started to feel weird.  Patient has been nauseous but no vomiting.  Pain 2/10 currently.  Patient A&O x4.   Allergies No Known Allergies  Level of Care/Admitting Diagnosis ED Disposition    ED Disposition Condition Comment   Admit  Hospital Area: [facility] [[id number]]  Level of Care: Med-Surg [16]  Diagnosis: SBO (small bowel obstruction) [facility]) [[id number]]  Admitting Physician: CCS, [place]  Attending Physician: CCS, MD [[id number]]  Estimated length of stay: past midnight tomorrow  Certification:: I certify this patient will need inpatient services for at least 2 midnights  Bed request comments: 6N  PT Class (Do Not Modify): Inpatient [101]  PT Acc Code (Do Not Modify): Private [1]       B Medical/Surgery History Past Medical History:  Diagnosis Date  . Diabetes mellitus, type II ([place]) 02/2013  . Diabetic ulcer of right foot due to type 2 diabetes mellitus ([place]) 03/21/2018  . DVT (deep venous thrombosis) ([place]) 05/04/2012   LLE  . Exertional dyspnea 05/04/2012   "isolated episode" (05/05/2012)  . Hepatic steatosis   . History of chickenpox   . Hypertension 03/21/2018  . Obesity   . Obesity, Class III, BMI 40-49.9 (morbid obesity) ([place]) 03/21/2018  . OSA on CPAP   . Peripheral vascular disease (HCC)    varicose veins  . Pulmonary embolism ([place])  05/04/2012   bilaterally  . Pyelonephritis 04/2017  . Small bowel obstruction ([place]) 09/06/2015  . Varicose veins    Past Surgical History:  Procedure Laterality Date  . CYSTOSCOPY W/ URETERAL STENT PLACEMENT Right 05/12/2017   Procedure: CYSTOSCOPY WITH RETROGRADE PYELOGRAM/URETERAL STENT PLACEMENT;  Surgeon: [name], MD;  Location: [facility];  Service: Urology;  Laterality: Right;  . CYSTOSCOPY/URETEROSCOPY/HOLMIUM LASER/STENT PLACEMENT Right 07/29/2017   Procedure: CYSTOSCOPY RIGHT URETEROSCOPY HOLMIUM LASER STENT EXCHANGE;  Surgeon: [name], MD;  [facility];  Service: Urology;  Laterality: Right;  . HERNIA REPAIR     2015  . LAPAROTOMY N/A 12/13/2013   Procedure: EXPLORATORY LAPAROTOMY Repair ventral hernia, without mesh, partial omentectomy;  Surgeon: Gwenyth Ober, MD;  Location: [facility];  Service: General;  Laterality: N/A;  . VENTRAL HERNIA REPAIR N/A 04/26/2017   Procedure: INCARCERATED HERNIA REPAIR VENTRAL ADULT;  Surgeon: Georganna Skeans, MD;  Location: [facility];  Service: General;  Laterality: N/A;     A IV Location/Drains/Wounds Patient Lines/Drains/Airways Status   Active Line/Drains/Airways    Name:   Placement date:   Placement time:   Site:   Days:   Peripheral IV 08/19/18 Left Antecubital   08/19/18    2309    Antecubital   1   Ureteral Drain/Stent Right ureter 6 Fr.   07/29/17    1013    Right ureter   387   Incision (Closed) 04/26/17 Abdomen Other (  Comment)   04/26/17    1416     481   Incision (Closed) 05/12/17 Perineum Other (Comment)   05/12/17    2200     465   Wound / Incision (Open or Dehisced) 03/21/18 Non-pressure wound Leg Right weeping & erythematous from lower leg down to ankle, marked with marking pen   03/21/18    2030    Leg   152          Intake/Output Last 24 hours No intake or output data in the 24 hours ending 08/20/18 0303  Labs/Imaging Results for orders placed or performed during the hospital encounter of 08/19/18 (from the past 48  hour(s))  CBC with Differential/Platelet     Status: Abnormal   Collection Time: 08/19/18 11:34 PM  Result Value Ref Range   WBC 16.4 (H) 4.0 - 10.5 K/uL   RBC 5.39 4.22 - 5.81 MIL/uL   Hemoglobin 14.7 13.0 - 17.0 g/dL   HCT 46.7 39.0 - 52.0 %   MCV 86.6 80.0 - 100.0 fL   MCH 27.3 26.0 - 34.0 pg   MCHC 31.5 30.0 - 36.0 g/dL   RDW 15.0 11.5 - 15.5 %   Platelets 242 150 - 400 K/uL   nRBC 0.0 0.0 - 0.2 %   Neutrophils Relative % 79 %   Neutro Abs 12.9 (H) 1.7 - 7.7 K/uL   Lymphocytes Relative 12 %   Lymphs Abs 1.9 0.7 - 4.0 K/uL   Monocytes Relative 8 %   Monocytes Absolute 1.3 (H) 0.1 - 1.0 K/uL   Eosinophils Relative 1 %   Eosinophils Absolute 0.2 0.0 - 0.5 K/uL   Basophils Relative 0 %   Basophils Absolute 0.1 0.0 - 0.1 K/uL   Immature Granulocytes 0 %   Abs Immature Granulocytes 0.06 0.00 - 0.07 K/uL    Comment: Performed at [facility], [address] [place], [place] [address]  Comprehensive metabolic panel     Status: Abnormal   Collection Time: 08/19/18 11:34 PM  Result Value Ref Range   Sodium 139 135 - 145 mmol/L   Potassium 3.9 3.5 - 5.1 mmol/L   Chloride 104 98 - 111 mmol/L   CO2 22 22 - 32 mmol/L   Glucose, Bld 175 (H) 70 - 99 mg/dL   BUN 18 6 - 20 mg/dL   Creatinine, Ser 1.43 (H) 0.61 - 1.24 mg/dL   Calcium 9.5 8.9 - 10.3 mg/dL   Total Protein 8.8 (H) 6.5 - 8.1 g/dL   Albumin 4.0 3.5 - 5.0 g/dL   AST 21 15 - 41 U/L   ALT 20 0 - 44 U/L   Alkaline Phosphatase 68 38 - 126 U/L   Total Bilirubin 1.0 0.3 - 1.2 mg/dL   GFR calc non Af Amer 53 (L) >60 mL/min   GFR calc Af Amer >60 >60 mL/min   Anion gap 13 5 - 15    Comment: Performed at [facility] [address], [place], [place] [address]  Lipase, blood     Status: None   Collection Time: 08/19/18 11:34 PM  Result Value Ref Range   Lipase 21 11 - 51 U/L    Comment: Performed at [facility] [address], [place], [place] [address]   Ct Abdomen Pelvis W Contrast  Result Date:  08/20/2018 CLINICAL DATA:  58 year old male with lower abdominal pain and nausea. EXAM: CT ABDOMEN AND PELVIS WITH CONTRAST TECHNIQUE: Multidetector CT imaging of  the abdomen and pelvis was performed using the standard protocol following bolus administration of intravenous contrast. CONTRAST:  163mL OMNIPAQUE IOHEXOL 300 MG/ML  SOLN COMPARISON:  CT Abdomen and Pelvis 05/12/2017. FINDINGS: Lower chest: Negative lung bases. No pericardial or pleural effusion. Calcified coronary artery atherosclerosis is evident on series 5, image 1. Hepatobiliary: Evidence of hepatic steatosis. Negative gallbladder. Pancreas: Atrophied, otherwise negative. Spleen: Negative. Adrenals/Urinary Tract: Normal adrenal glands. Left greater than right nephrolithiasis. No hydronephrosis or hydroureter. Symmetric renal enhancement and contrast excretion. Unremarkable urinary bladder. Stomach/Bowel: Sigmoid redundancy and diverticulosis. A portion of the sigmoid colon is contained within the complex ventral abdominal hernia (on series 3, image 84), however, the herniated sigmoid does not appear incarcerated and there is no upstream large bowel dilatation. Diverticulosis at the hepatic flexure. No large bowel inflammation. Negative appendix. Decompressed and negative terminal ileum. Complex, lobulated, 8 x 18 x 13 centimeter ventral abdominal hernia) (AP by transverse by CC) containing mesentery and mostly small bowel. A dilated loop of small bowel enters the right caudal aspect of the hernia on series 3, image 74 and appears incarcerated and mildly inflamed within the hernia sac. Small bowel distal to this is decompressed. Other nondilated small bowel loops are also contained within the hernia sac, mostly to the left of midline. There are borderline dilated fluid and air containing small bowel loops along the cephalad aspect of the hernia. The stomach is mildly dilated. The duodenum and proximal jejunum are decompressed. No free air. No free  fluid. Vascular/Lymphatic: Major arterial structures appear patent. Mild Aortoiliac calcified atherosclerosis. Patent portal venous system. No lymphadenopathy. Reproductive: Negative. Other: No pelvic free fluid. Musculoskeletal: No acute osseous abnormality identified. IMPRESSION: 1. Small-bowel obstruction secondary to incarcerated loops within a complex and lobulated ventral abdominal hernia encompassing 8 x 18 x 13 cm. There are also non-incarcerated loops of small bowel and a small portion of the sigmoid colon within the hernia. 2. No free air or free fluid. 3. Hepatic steatosis.  Calcified coronary artery atherosclerosis. Electronically Signed   By: Genevie Ann M.D.   On: 08/20/2018 01:37    Pending Labs Unresulted Labs (From admission, onward)    Start     Ordered   08/20/18 1761  Basic metabolic panel  Tomorrow morning,   R     08/20/18 0233   08/20/18 0500  CBC  Tomorrow morning,   R     08/20/18 0233          Vitals/Pain Today's Vitals   08/20/18 0045 08/20/18 0145 08/20/18 0200 08/20/18 0230  BP: 138/80 (!) 116/98 128/85 136/83  Pulse: 99 (!) 101 97 91  Resp: 19     Temp:      TempSrc:      SpO2: 92% 94% 95% 96%  Weight:      Height:      PainSc:        Isolation Precautions No active isolations  Medications Medications  sodium chloride 0.9 % bolus 500 mL (has no administration in time range)  0.9 %  sodium chloride infusion (has no administration in time range)  HYDROmorphone (DILAUDID) injection 0.5 mg (has no administration in time range)  ondansetron (ZOFRAN-ODT) disintegrating tablet 4 mg (has no administration in time range)    Or  ondansetron (ZOFRAN) injection 4 mg (has no administration in time range)  metoprolol tartrate (LOPRESSOR) injection 5 mg (has no administration in time range)  hydrALAZINE (APRESOLINE) injection 10 mg (has no administration in time range)  insulin  aspart (novoLOG) injection 0-20 Units (has no administration in time range)   ondansetron (ZOFRAN) injection 4 mg (4 mg Intravenous Given 08/19/18 2343)  fentaNYL (SUBLIMAZE) injection 100 mcg (100 mcg Intravenous Given 08/19/18 2343)  iohexol (OMNIPAQUE) 300 MG/ML solution 100 mL (100 mLs Intravenous Contrast Given 08/20/18 0115)    Mobility walks Low fall risk   Focused Assessments    R Recommendations: See Admitting Provider Note  Report given to:   Additional Notes:

## 2018-08-20 NOTE — ED Notes (Signed)
Pt says he doesn't have urge to pee

## 2018-08-20 NOTE — ED Provider Notes (Signed)
[facility] EMERGENCY DEPARTMENT Provider Note   CSN: [id number] Arrival date & time: 08/19/18  2251    History   Chief Complaint Chief Complaint  Patient presents with  . Abdominal Pain  . Nausea    HPI Evan Leonard is a 58 y.o. male.     The history is provided by the patient.  Abdominal Pain  Pain location:  Periumbilical Pain quality: sharp   Pain radiates to:  Epigastric region Pain severity:  Severe Onset quality:  Gradual Duration:  2 days Timing:  Constant Progression:  Worsening Chronicity:  New Context comment:  Playing with his puppy Relieved by:  Nothing Worsened by:  Movement and palpation Associated symptoms: nausea and vomiting   Associated symptoms: no chest pain, no constipation, no cough, no dysuria, no fever, no hematemesis, no hematochezia and no melena   Associated symptoms comment:  Heartburn   PT with History of diabetes, DVT on Eliquis, hypertension, obesity presents with abdominal pain for the past 2 days.  He reports he was playing with his puppy about 2 days ago and may have injured his abdomen in the process.  Since that time is been having significant pain in his periumbilical region radiating to his epigastric region.  He said associated nausea vomiting. He is having Normal bowel movements. He has previous history of hernia repair in that same area Past Medical History:  Diagnosis Date  . Diabetes mellitus, type II ([place]) 02/2013  . Diabetic ulcer of right foot due to type 2 diabetes mellitus ([place]) 03/21/2018  . DVT (deep venous thrombosis) ([place]) 05/04/2012   LLE  . Exertional dyspnea 05/04/2012   "isolated episode" (05/05/2012)  . Hepatic steatosis   . History of chickenpox   . Hypertension 03/21/2018  . Obesity   . Obesity, Class III, BMI 40-49.9 (morbid obesity) ([place]) 03/21/2018  . OSA on CPAP   . Peripheral vascular disease (HCC)    varicose veins  . Pulmonary embolism ([place]) 05/04/2012   bilaterally  .  Pyelonephritis 04/2017  . Small bowel obstruction ([place]) 09/06/2015  . Varicose veins     Patient Active Problem List   Diagnosis Date Noted  . Diabetic ulcer of right foot due to type 2 diabetes mellitus ([place]) 03/21/2018  . Atypical chest pain 02/09/2018  . Renal stone 05/12/2017  . S/P repair of recurrent ventral hernia 04/26/2017  . Chronic venous insufficiency 01/25/2015  . Diabetes mellitus with hyperglycemia ([place]) 03/31/2013  . Hyponatremia 03/23/2013  . Pulmonary embolism ([place]) 05/05/2012  . Morbid obesity with body mass index of 40.0-44.9 in adult [facility]) 05/05/2012  . Obstructive sleep apnea 05/05/2012  . DVT of left distal popliteal vein 05/05/2012    Past Surgical History:  Procedure Laterality Date  . CYSTOSCOPY W/ URETERAL STENT PLACEMENT Right 05/12/2017   Procedure: CYSTOSCOPY WITH RETROGRADE PYELOGRAM/URETERAL STENT PLACEMENT;  Surgeon: [name], MD;  Location: [facility];  Service: Urology;  Laterality: Right;  . CYSTOSCOPY/URETEROSCOPY/HOLMIUM LASER/STENT PLACEMENT Right 07/29/2017   Procedure: CYSTOSCOPY RIGHT URETEROSCOPY HOLMIUM LASER STENT EXCHANGE;  Surgeon: [name], MD;  [facility];  Service: Urology;  Laterality: Right;  . HERNIA REPAIR     2015  . LAPAROTOMY N/A 12/13/2013   Procedure: EXPLORATORY LAPAROTOMY Repair ventral hernia, without mesh, partial omentectomy;  Surgeon: Gwenyth Ober, MD;  Location: [facility];  Service: General;  Laterality: N/A;  . VENTRAL HERNIA REPAIR N/A 04/26/2017   Procedure: INCARCERATED HERNIA REPAIR VENTRAL ADULT;  Surgeon: Georganna Skeans, MD;  [facility];  Service: General;  Laterality: N/A;        Home Medications    Prior to Admission medications   Medication Sig Start Date End Date Taking? Authorizing Provider  ELIQUIS 5 MG TABS tablet TAKE 1 TABLET BY MOUTH TWICE A DAY 06/23/18   [name], MD  furosemide (LASIX) 40 MG tablet Take 1 tablet (40 mg total) by mouth daily as needed for fluid or edema. 02/09/18    [name], MD  HYDROcodone-acetaminophen (NORCO/VICODIN) 5-325 MG tablet Take 0.5-1 tablets by mouth every 8 (eight) hours as needed for moderate pain or severe pain. 03/30/18   [name], MD  lisinopril (PRINIVIL,ZESTRIL) 2.5 MG tablet TAKE 1 TABLET BY MOUTH EVERY DAY 06/23/18   [name], MD  metFORMIN (GLUCOPHAGE) 500 MG tablet Take 2 tablets (1,000 mg total) by mouth 2 (two) times daily with a meal. 08/26/17   [name], MD  Multiple Vitamin (MULTIVITAMIN WITH MINERALS) TABS tablet Take 1 tablet by mouth daily.     [provider]  mupirocin ointment (BACTROBAN) 2 % Place 1 application into the nose 2 (two) times daily. May also apply to open areas right leg wound 03/24/18   Roxan Hockey, MD    Family History Family History  Problem Relation Age of Onset  . Diabetes Mother        died age 80- also bleeding ulcer  . Hypertension Mother   . Heart failure Father        in his 67s (patient was 83). day before surgery planned  . Other Maternal Grandmother        states natural causes all grandparents    Social History Social History   Tobacco Use  . Smoking status: Never Smoker  . Smokeless tobacco: Never Used  Substance Use Topics  . Alcohol use: No    Alcohol/week: 0.0 standard drinks  . Drug use: No     Allergies   Patient has no known allergies.   Review of Systems Review of Systems  Constitutional: Negative for fever.  Respiratory: Negative for cough.   Cardiovascular: Negative for chest pain.  Gastrointestinal: Positive for abdominal pain, nausea and vomiting. Negative for blood in stool, constipation, hematemesis, hematochezia and melena.  Genitourinary: Negative for dysuria.  All other systems reviewed and are negative.    Physical Exam Updated Vital Signs BP 131/83   Pulse 96   Temp 98.7 F (37.1 C) (Oral)   Resp 16   Ht 1.905 m (6\' 3" )   Wt (!) 170.1 kg   SpO2 93%   BMI 46.87 kg/m   Physical Exam  CONSTITUTIONAL: Well developed/well nourished HEAD: Normocephalic/atraumatic EYES: EOMI ENMT: Mucous membranes moist NECK: supple no meningeal signs CV: S1/S2 noted, no murmurs/rubs/gallops noted LUNGS: Lungs are clear to auscultation bilaterally, no apparent distress ABDOMEN: soft, obese, moderate tenderness in the periumbilical region, no obvious hernias but exam is limited due to body size, no abscess or cellulitis GU:no cva tenderness NEURO: Pt is awake/alert/appropriate, moves all extremitiesx4.  No facial droop.   EXTREMITIES: pulses normal/equal, full ROM SKIN: warm, color normal PSYCH: no abnormalities of mood noted, alert and oriented to situation   ED Treatments / Results  Labs (all labs ordered are listed, but only abnormal results are displayed) Labs Reviewed  CBC WITH DIFFERENTIAL/PLATELET - Abnormal; Notable for the following components:      Result Value   WBC 16.4 (*)    Neutro Abs 12.9 (*)  Monocytes Absolute 1.3 (*)    All other components within normal limits  COMPREHENSIVE METABOLIC PANEL - Abnormal; Notable for the following components:   Glucose, Bld 175 (*)    Creatinine, Ser 1.43 (*)    Total Protein 8.8 (*)    GFR calc non Af Amer 53 (*)    All other components within normal limits  LIPASE, BLOOD    EKG EKG Interpretation  Date/Time:  Wednesday August 19 2018 23:05:15 EDT Ventricular Rate:  106 PR Interval:    QRS Duration: 97 QT Interval:  337 QTC Calculation: 448 R Axis:   73 Text Interpretation:  Sinus tachycardia Confirmed by Ripley Fraise [phone]) on 08/20/2018 12:45:34 AM   Radiology Ct Abdomen Pelvis W Contrast  Result Date: 08/20/2018 CLINICAL DATA:  58 year old male with lower abdominal pain and nausea. EXAM: CT ABDOMEN AND PELVIS WITH CONTRAST TECHNIQUE: Multidetector CT imaging of the abdomen and pelvis was performed using the standard protocol following bolus administration of intravenous contrast. CONTRAST:  157mL OMNIPAQUE  IOHEXOL 300 MG/ML  SOLN COMPARISON:  CT Abdomen and Pelvis 05/12/2017. FINDINGS: Lower chest: Negative lung bases. No pericardial or pleural effusion. Calcified coronary artery atherosclerosis is evident on series 5, image 1. Hepatobiliary: Evidence of hepatic steatosis. Negative gallbladder. Pancreas: Atrophied, otherwise negative. Spleen: Negative. Adrenals/Urinary Tract: Normal adrenal glands. Left greater than right nephrolithiasis. No hydronephrosis or hydroureter. Symmetric renal enhancement and contrast excretion. Unremarkable urinary bladder. Stomach/Bowel: Sigmoid redundancy and diverticulosis. A portion of the sigmoid colon is contained within the complex ventral abdominal hernia (on series 3, image 84), however, the herniated sigmoid does not appear incarcerated and there is no upstream large bowel dilatation. Diverticulosis at the hepatic flexure. No large bowel inflammation. Negative appendix. Decompressed and negative terminal ileum. Complex, lobulated, 8 x 18 x 13 centimeter ventral abdominal hernia) (AP by transverse by CC) containing mesentery and mostly small bowel. A dilated loop of small bowel enters the right caudal aspect of the hernia on series 3, image 74 and appears incarcerated and mildly inflamed within the hernia sac. Small bowel distal to this is decompressed. Other nondilated small bowel loops are also contained within the hernia sac, mostly to the left of midline. There are borderline dilated fluid and air containing small bowel loops along the cephalad aspect of the hernia. The stomach is mildly dilated. The duodenum and proximal jejunum are decompressed. No free air. No free fluid. Vascular/Lymphatic: Major arterial structures appear patent. Mild Aortoiliac calcified atherosclerosis. Patent portal venous system. No lymphadenopathy. Reproductive: Negative. Other: No pelvic free fluid. Musculoskeletal: No acute osseous abnormality identified. IMPRESSION: 1. Small-bowel obstruction  secondary to incarcerated loops within a complex and lobulated ventral abdominal hernia encompassing 8 x 18 x 13 cm. There are also non-incarcerated loops of small bowel and a small portion of the sigmoid colon within the hernia. 2. No free air or free fluid. 3. Hepatic steatosis.  Calcified coronary artery atherosclerosis. Electronically Signed   By: Genevie Ann M.D.   On: 08/20/2018 01:37    Procedures Procedures  Medications Ordered in ED Medications  sodium chloride 0.9 % bolus 500 mL (has no administration in time range)  ondansetron (ZOFRAN) injection 4 mg (4 mg Intravenous Given 08/19/18 2343)  fentaNYL (SUBLIMAZE) injection 100 mcg (100 mcg Intravenous Given 08/19/18 2343)  iohexol (OMNIPAQUE) 300 MG/ML solution 100 mL (100 mLs Intravenous Contrast Given 08/20/18 0115)     Initial Impression / Assessment and Plan / ED Course  I have reviewed the triage vital signs and  the nursing notes.  Pertinent labs results that were available during my care of the patient were reviewed by me and considered in my medical decision making (see chart for details).        1:59 AM Patient presented with abdominal pain and nausea vomiting.  Physical Exam was limited, CT scan was ordered. CT scan reveals incarcerated hernia, resulting in small bowel obstruction.  Patient reports his pain is improved.  Discussed with Dr. Windle Guard with general surgery who will evaluate the patient.  Of note, patient is on Eliquis  Final Clinical Impressions(s) / ED Diagnoses   Final diagnoses:  Incarcerated hernia  Small bowel obstruction [facility])    ED Discharge Orders    None       Ripley Fraise, MD 08/20/18 0200

## 2018-08-20 NOTE — Progress Notes (Signed)
ANTICOAGULATION CONSULT NOTE - Follow Up Consult  Pharmacy Consult for Heparin Indication: h/o DVT/PE  No Known Allergies  Patient Measurements: Height: 6' 2.5" (189.2 cm) Weight: (!) 347 lb 9.6 oz (157.7 kg) IBW/kg (Calculated) : 83.35 Heparin Dosing Weight: 120.2  Vital Signs: Temp: 98.4 F (36.9 C) (03/26 1957) Temp Source: Oral (03/26 1957) BP: 127/79 (03/26 1957) Pulse Rate: 76 (03/26 1957)  Labs: Recent Labs    08/19/18 2334 08/20/18 0527 08/20/18 1300 08/20/18 1939  HGB 14.7 12.6*  --   --   HCT 46.7 40.4  --   --   PLT 242 204  --   --   APTT  --   --  29  --   HEPARINUNFRC  --   --  <0.10* 0.29*  CREATININE 1.43* 1.27*  --   --     Estimated Creatinine Clearance: 100.2 mL/min (A) (by C-G formula based on SCr of 1.27 mg/dL (H)).  Assessment:  Anticoag: apix pta for dvt/pe hx on hold >> heparin. Cbc ok. Heparin level 0.29 slightly low. - Hold apixaban - although baseline HL<0.1, appears ?compliance. LF in [place] 04/27/18 (30-day supply)  Goal of Therapy:  Heparin level 0.3-0.7 units/ml Monitor platelets by anticoagulation protocol: Yes   Plan:  Increase IV heparin to 2200 units/hr Monitor daily heparin level/CBC, s/sx bleeding  Milus Fritze S. Alford Highland, PharmD, [place] Clinical Staff Pharmacist Eilene Ghazi Stillinger 08/20/2018,8:05 PM

## 2018-08-20 NOTE — Progress Notes (Signed)
Subjective No acute events. Continues to feel better. Denies any n/v. Denies any abdominal pain. No flatus/BM yet  Objective: Vital signs in last 24 hours: Temp:  [97.7 F (36.5 C)-98.7 F (37.1 C)] 97.7 F (36.5 C) (03/26 0338) Pulse Rate:  [85-108] 85 (03/26 0338) Resp:  [16-20] 18 (03/26 0338) BP: (116-156)/(80-98) 131/91 (03/26 0338) SpO2:  [92 %-99 %] 99 % (03/26 0338) Weight:  [157.7 kg-170.1 kg] 157.7 kg (03/26 0338) Last BM Date: 08/19/18  Intake/Output from previous day: 03/25 0701 - 03/26 0700 In: 500 [IV Piggyback:500] Out: -  Intake/Output this shift: No intake/output data recorded.  Gen: NAD, comfortable CV: RRR Pulm: Normal work of breathing Abd: Obese, soft, NT/ND; ventral hernias are all soft without skin changes and freely reducible; no evidence of incarcerated loops of intestine at this time. No tenderness to palpation of the hernias either. Ext: SCDs in place  Lab Results: CBC  Recent Labs    08/19/18 2334 08/20/18 0527  WBC 16.4* 9.8  HGB 14.7 12.6*  HCT 46.7 40.4  PLT 242 204   BMET Recent Labs    08/19/18 2334 08/20/18 0527  NA 139 138  K 3.9 4.4  CL 104 109  CO2 22 24  GLUCOSE 175* 150*  BUN 18 18  CREATININE 1.43* 1.27*  CALCIUM 9.5 8.7*   PT/INR No results for input(s): LABPROT, INR in the last 72 hours. ABG No results for input(s): PHART, HCO3 in the last 72 hours.  Invalid input(s): PCO2, PO2  Studies/Results:  Anti-infectives: Anti-infectives (From admission, onward)   None       Assessment/Plan: Patient Active Problem List   Diagnosis Date Noted  . SBO (small bowel obstruction) ([place]) 08/20/2018  . Diabetic ulcer of right foot due to type 2 diabetes mellitus ([place]) 03/21/2018  . Atypical chest pain 02/09/2018  . Renal stone 05/12/2017  . S/P repair of recurrent ventral hernia 04/26/2017  . Chronic venous insufficiency 01/25/2015  . Diabetes mellitus with hyperglycemia ([place]) 03/31/2013  . Hyponatremia 03/23/2013   . Pulmonary embolism ([place]) 05/05/2012  . Morbid obesity with body mass index of 40.0-44.9 in adult [facility]) 05/05/2012  . Obstructive sleep apnea 05/05/2012  . DVT of left distal popliteal vein 05/05/2012   -No evidence of incarcerated bowel - all hernias are freely reducible and soft, nontender. He has no n/v. -Will continue to monitor - SBO protocol underway - will follow and monitor XRs -NPO, MIVF for time being   LOS: 0 days   Sharon Mt. Dema Severin, M.D. [facility]

## 2018-08-20 NOTE — TOC Initial Note (Signed)
Transition of Care [facility]) - Initial/Assessment Note    Patient Details  Name: Evan Leonard MRN: [id number] Date of Birth: 19-Feb-1961  [facility]) CM/SW Contact:    Marilu Favre, RN Phone Number: 08/20/2018, 10:26 AM  Clinical Narrative:                 Patient from home alone. He has family and friends close by if he needs assistance at discharge.   Patient on Eliquis prior to admission. He has used 30 day free card and co pay card. His Eliquis co pay now is $70/month. He has been able to afford co pay , but wondering if he can use co pay again.  Called Eliquis assistance program. [phone]. Spoke with [employer]. Patient can re enroll in the co pay card program. Patient just needs to call same number and answer some questions.  Provided patient with above information and a new Eliquis  Co pay card with phone number. Due to HIPPA, patient has to call himself.   Patient voiced understanding and stated he will call.  Expected Discharge Plan: Home/Self Care Barriers to Discharge: Continued Medical Work up   Patient Goals and CMS Choice Patient states their goals for this hospitalization and ongoing recovery are:: to go home       Expected Discharge Plan and Services Expected Discharge Plan: Home/Self Care   Discharge Planning Services: CM Consult Post Acute Care Choice: NA Living arrangements for the past 2 months: Single Family Home Expected Discharge Date: 08/23/18               DME Arranged: N/A DME Agency: NA HH Arranged: NA [place] Agency: NA  Prior Living Arrangements/Services Living arrangements for the past 2 months: Single Family Home Lives with:: Self Patient language and need for interpreter reviewed:: No Do you feel safe going back to the place where you live?: Yes      Need for Family Participation in Patient Care: No (Comment)     Criminal Activity/Legal Involvement Pertinent to Current Situation/Hospitalization: No - Comment as  needed  Activities of Daily Living Home Assistive Devices/Equipment: CBG Meter, Eyeglasses ADL Screening (condition at time of admission) Patient's cognitive ability adequate to safely complete daily activities?: Yes Is the patient deaf or have difficulty hearing?: No Does the patient have difficulty seeing, even when wearing glasses/contacts?: No Does the patient have difficulty concentrating, remembering, or making decisions?: No Patient able to express need for assistance with ADLs?: Yes Does the patient have difficulty dressing or bathing?: No Independently performs ADLs?: Yes (appropriate for developmental age) Does the patient have difficulty walking or climbing stairs?: No Weakness of Legs: None Weakness of Arms/Hands: None  Permission Sought/Granted Permission sought to share information with : Case Manager Permission granted to share information with : No              Emotional Assessment   Attitude/Demeanor/Rapport: Engaged Affect (typically observed): Accepting Orientation: : Oriented to  Time, Oriented to Place, Oriented to Self   Psych Involvement: No (comment)  Admission diagnosis:  Small bowel obstruction ([place]) [K56.609] SBO (small bowel obstruction) ([place]) [K56.609] Incarcerated hernia [K46.0] Patient Active Problem List   Diagnosis Date Noted  . SBO (small bowel obstruction) ([place]) 08/20/2018  . Diabetic ulcer of right foot due to type 2 diabetes mellitus ([place]) 03/21/2018  . Atypical chest pain 02/09/2018  . Renal stone 05/12/2017  . S/P repair of recurrent ventral hernia 04/26/2017  . Chronic venous insufficiency  01/25/2015  . Diabetes mellitus with hyperglycemia ([place]) 03/31/2013  . Hyponatremia 03/23/2013  . Pulmonary embolism ([place]) 05/05/2012  . Morbid obesity with body mass index of 40.0-44.9 in adult [facility]) 05/05/2012  . Obstructive sleep apnea 05/05/2012  . DVT of left distal popliteal vein 05/05/2012   PCP:  [name], MD Pharmacy:    [id number] - [place], [place] [address] Phone: [phone] Fax: [phone]  [facility] [place] [place] - [address] [place] [address] Phone: [phone] Fax: [phone]     Social Determinants of Health (SDOH) Interventions    Readmission Risk Interventions No flowsheet data found.

## 2018-08-21 ENCOUNTER — Inpatient Hospital Stay (HOSPITAL_COMMUNITY): Payer: PRIVATE HEALTH INSURANCE

## 2018-08-21 LAB — CBC
HCT: 39 % (ref 39.0–52.0)
Hemoglobin: 12.5 g/dL — ABNORMAL LOW (ref 13.0–17.0)
MCH: 28.3 pg (ref 26.0–34.0)
MCHC: 32.1 g/dL (ref 30.0–36.0)
MCV: 88.2 fL (ref 80.0–100.0)
Platelets: 175 10*3/uL (ref 150–400)
RBC: 4.42 MIL/uL (ref 4.22–5.81)
RDW: 15.2 % (ref 11.5–15.5)
WBC: 7.4 10*3/uL (ref 4.0–10.5)
nRBC: 0 % (ref 0.0–0.2)

## 2018-08-21 LAB — GLUCOSE, CAPILLARY
Glucose-Capillary: 102 mg/dL — ABNORMAL HIGH (ref 70–99)
Glucose-Capillary: 104 mg/dL — ABNORMAL HIGH (ref 70–99)
Glucose-Capillary: 105 mg/dL — ABNORMAL HIGH (ref 70–99)
Glucose-Capillary: 105 mg/dL — ABNORMAL HIGH (ref 70–99)
Glucose-Capillary: 109 mg/dL — ABNORMAL HIGH (ref 70–99)
Glucose-Capillary: 138 mg/dL — ABNORMAL HIGH (ref 70–99)

## 2018-08-21 LAB — HEPARIN LEVEL (UNFRACTIONATED): Heparin Unfractionated: 0.48 IU/mL (ref 0.30–0.70)

## 2018-08-21 MED ORDER — HYDROMORPHONE HCL 1 MG/ML IJ SOLN: 0.5000 mg | INTRAMUSCULAR | Status: DC | PRN

## 2018-08-21 MED ORDER — OXYCODONE HCL 5 MG PO TABS: 5.0000 mg | ORAL_TABLET | ORAL | Status: DC | PRN

## 2018-08-21 MED ORDER — POLYETHYLENE GLYCOL 3350 17 G PO PACK
17.0000 g | PACK | Freq: Every day | ORAL | Status: DC
  Filled 2018-08-21 (×2): qty 1

## 2018-08-21 MED ORDER — LISINOPRIL 5 MG PO TABS
2.5000 mg | ORAL_TABLET | Freq: Every day | ORAL | Status: DC
  Administered 2018-08-21 – 2018-08-22 (×2): 2.5 mg via ORAL
  Filled 2018-08-21 (×2): qty 1

## 2018-08-21 MED ORDER — DOCUSATE SODIUM 100 MG PO CAPS
100.0000 mg | ORAL_CAPSULE | Freq: Two times a day (BID) | ORAL | Status: DC
  Administered 2018-08-22: 100 mg via ORAL
  Filled 2018-08-21: qty 1

## 2018-08-21 MED ORDER — ORAL CARE MOUTH RINSE: 15.0000 mL | Freq: Two times a day (BID) | OROMUCOSAL | Status: DC

## 2018-08-21 MED ORDER — ACETAMINOPHEN 325 MG PO TABS: 650.0000 mg | ORAL_TABLET | Freq: Four times a day (QID) | ORAL | Status: DC | PRN

## 2018-08-21 MED ORDER — BISACODYL 10 MG RE SUPP
10.0000 mg | Freq: Once | RECTAL | Status: DC
  Filled 2018-08-21: qty 1

## 2018-08-21 NOTE — Progress Notes (Signed)
[facility] Progress Note     Subjective: CC-  No complaints this morning. Denies abdominal pain. Denies n/v. Passing flatus, no BM. Ambulated about 7 laps in the hall yesterday. Xray today shows normalizing bowel gas pattern with colon containing gas and stool.  Objective: Vital signs in last 24 hours: Temp:  [97.6 F (36.4 C)-99 F (37.2 C)] 97.7 F (36.5 C) (03/27 0148) Pulse Rate:  [71-89] 82 (03/27 0429) Resp:  [16-18] 16 (03/27 0429) BP: (127-148)/(79-88) 127/80 (03/27 0429) SpO2:  [95 %-99 %] 99 % (03/27 0429) Last BM Date: 08/19/18  Intake/Output from previous day: 03/26 0701 - 03/27 0700 In: 3022.7 [I.V.:3022.7] Out: -  Intake/Output this shift: No intake/output data recorded.  PE: Gen:  Alert, NAD, pleasant HEENT: EOM's intact, pupils equal and round Card:  RRR Pulm:  CTAB, no W/R/R, effort normal Abd: Obese, soft, NT/ND, +BS; ventral hernias are all soft, nontender, and no overlying skin changes Ext:  Calves soft and nontender Psych: A&Ox3  Skin: no rashes noted, warm and dry  Lab Results:  Recent Labs    08/20/18 0527 08/21/18 0546  WBC 9.8 7.4  HGB 12.6* 12.5*  HCT 40.4 39.0  PLT 204 175   BMET Recent Labs    08/19/18 2334 08/20/18 0527  NA 139 138  K 3.9 4.4  CL 104 109  CO2 22 24  GLUCOSE 175* 150*  BUN 18 18  CREATININE 1.43* 1.27*  CALCIUM 9.5 8.7*   PT/INR No results for input(s): LABPROT, INR in the last 72 hours. CMP     Component Value Date/Time   NA 138 08/20/2018 0527   K 4.4 08/20/2018 0527   CL 109 08/20/2018 0527   CO2 24 08/20/2018 0527   GLUCOSE 150 (H) 08/20/2018 0527   BUN 18 08/20/2018 0527   CREATININE 1.27 (H) 08/20/2018 0527   CALCIUM 8.7 (L) 08/20/2018 0527   PROT 8.8 (H) 08/19/2018 2334   ALBUMIN 4.0 08/19/2018 2334   AST 21 08/19/2018 2334   ALT 20 08/19/2018 2334   ALKPHOS 68 08/19/2018 2334   BILITOT 1.0 08/19/2018 2334   GFRNONAA >60 08/20/2018 0527   GFRAA >60 08/20/2018 0527    Lipase     Component Value Date/Time   LIPASE 21 08/19/2018 2334       Studies/Results: Dg Abd 1 View  Result Date: 08/20/2018 CLINICAL DATA:  Small-bowel obstruction EXAM: ABDOMEN - 1 VIEW COMPARISON:  CT 08/20/2018 FINDINGS: Mild small bowel dilatation. Gas in nondilated colon. Stool in the colon. Gas in the rectum. Lumbar degenerative change IMPRESSION: Mild small bowel dilatation similar to recent CT. Electronically Signed   By: Franchot Gallo M.D.   On: 08/20/2018 08:30   Ct Abdomen Pelvis W Contrast  Result Date: 08/20/2018 CLINICAL DATA:  58 year old male with lower abdominal pain and nausea. EXAM: CT ABDOMEN AND PELVIS WITH CONTRAST TECHNIQUE: Multidetector CT imaging of the abdomen and pelvis was performed using the standard protocol following bolus administration of intravenous contrast. CONTRAST:  137mL OMNIPAQUE IOHEXOL 300 MG/ML  SOLN COMPARISON:  CT Abdomen and Pelvis 05/12/2017. FINDINGS: Lower chest: Negative lung bases. No pericardial or pleural effusion. Calcified coronary artery atherosclerosis is evident on series 5, image 1. Hepatobiliary: Evidence of hepatic steatosis. Negative gallbladder. Pancreas: Atrophied, otherwise negative. Spleen: Negative. Adrenals/Urinary Tract: Normal adrenal glands. Left greater than right nephrolithiasis. No hydronephrosis or hydroureter. Symmetric renal enhancement and contrast excretion. Unremarkable urinary bladder. Stomach/Bowel: Sigmoid redundancy and diverticulosis. A portion of the sigmoid colon is contained  within the complex ventral abdominal hernia (on series 3, image 84), however, the herniated sigmoid does not appear incarcerated and there is no upstream large bowel dilatation. Diverticulosis at the hepatic flexure. No large bowel inflammation. Negative appendix. Decompressed and negative terminal ileum. Complex, lobulated, 8 x 18 x 13 centimeter ventral abdominal hernia) (AP by transverse by CC) containing mesentery and mostly small  bowel. A dilated loop of small bowel enters the right caudal aspect of the hernia on series 3, image 74 and appears incarcerated and mildly inflamed within the hernia sac. Small bowel distal to this is decompressed. Other nondilated small bowel loops are also contained within the hernia sac, mostly to the left of midline. There are borderline dilated fluid and air containing small bowel loops along the cephalad aspect of the hernia. The stomach is mildly dilated. The duodenum and proximal jejunum are decompressed. No free air. No free fluid. Vascular/Lymphatic: Major arterial structures appear patent. Mild Aortoiliac calcified atherosclerosis. Patent portal venous system. No lymphadenopathy. Reproductive: Negative. Other: No pelvic free fluid. Musculoskeletal: No acute osseous abnormality identified. IMPRESSION: 1. Small-bowel obstruction secondary to incarcerated loops within a complex and lobulated ventral abdominal hernia encompassing 8 x 18 x 13 cm. There are also non-incarcerated loops of small bowel and a small portion of the sigmoid colon within the hernia. 2. No free air or free fluid. 3. Hepatic steatosis.  Calcified coronary artery atherosclerosis. Electronically Signed   By: Genevie Ann M.D.   On: 08/20/2018 01:37   Dg Abd Portable 1v  Result Date: 08/21/2018 CLINICAL DATA:  Small bowel obstruction EXAM: PORTABLE ABDOMEN - 1 VIEW COMPARISON:  Yesterday FINDINGS: There are 2 mildly dilated small bowel loops. Bowel gas pattern has overall improved from prior with less numerous and distended loops. Colon still contains gas and stool. Clear lung bases. No concerning mass effect or gas collection as permitted by body habitus IMPRESSION: Normalizing bowel gas pattern. Electronically Signed   By: Monte Fantasia M.D.   On: 08/21/2018 07:29    Anti-infectives: Anti-infectives (From admission, onward)   None       Assessment/Plan H/o DVT/PT - IV heparin during admission, ok to restart eliquis at  discharge Obesity HTN - home med DM - SSI  Re-recurrent ventral hernia with partial SBO - Xray today shows normalizing bowel gas pattern with colon containing gas and stool. Patient clinically improving as well. Passing flatus and nontender on exam. Advance to clear liquids, ADAT to soft. Add miralax/colace, give dulcolax suppository. Continue ambulating.   ID - none FEN - decrease IVF, CLD advance as tol to soft VTE - SCDs, IV heparin Foley - none Follow up - TBD   LOS: 1 day    Wellington Hampshire , [facility] 08/21/2018, 8:47 AM Pager: [phone] Mon-Thurs 7:00 am-4:30 pm Fri 7:00 am -11:30 AM Sat-Sun 7:00 am-11:30 am

## 2018-08-21 NOTE — Progress Notes (Signed)
[place] for heparin Indication: Hx PE/DVT on apixaban PTA  Heparin Dosing Weight: 120.2 kg  Labs: Recent Labs    08/19/18 2334 08/20/18 0527 08/20/18 1300 08/20/18 1939 08/21/18 0546  HGB 14.7 12.6*  --   --  12.5*  HCT 46.7 40.4  --   --  39.0  PLT 242 204  --   --  175  APTT  --   --  29  --   --   HEPARINUNFRC  --   --  <0.10* 0.29* 0.48  CREATININE 1.43* 1.27*  --   --   --     Assessment: 53 yom on apixaban PTA (last dose 3/25 at 1000 per med rec) for hx of DVT/PE presenting with abdominal pain, incarcerated recurrent ventral hernia with partial SBO. Admitted for bowel rest and possible surgery. Pharmacy consulted to hold apixaban and start heparin.   Heparin level therapeutic at 0.48. CBC stable. No active bleed issues documented. Monitoring heparin levels only as patient states he has not taken his apixaban in several days PTA, baseline heparin level undetectable.  Goal of Therapy:  Heparin level 0.3-0.7 units/ml Monitor platelets by anticoagulation protocol: Yes   Plan:  Continue heparin at 2200 units/hr Monitor daily heparin level/CBC, s/sx bleeding Surgery plans to resume apixaban on discharge  Elicia Lamp, PharmD, BCPS Clinical Pharmacist 08/21/2018 11:06 AM

## 2018-08-22 LAB — CBC
HCT: 38.7 % — ABNORMAL LOW (ref 39.0–52.0)
Hemoglobin: 12 g/dL — ABNORMAL LOW (ref 13.0–17.0)
MCH: 27.2 pg (ref 26.0–34.0)
MCHC: 31 g/dL (ref 30.0–36.0)
MCV: 87.8 fL (ref 80.0–100.0)
Platelets: 175 10*3/uL (ref 150–400)
RBC: 4.41 MIL/uL (ref 4.22–5.81)
RDW: 14.9 % (ref 11.5–15.5)
WBC: 7.7 10*3/uL (ref 4.0–10.5)
nRBC: 0 % (ref 0.0–0.2)

## 2018-08-22 LAB — GLUCOSE, CAPILLARY
Glucose-Capillary: 114 mg/dL — ABNORMAL HIGH (ref 70–99)
Glucose-Capillary: 111 mg/dL — ABNORMAL HIGH (ref 70–99)

## 2018-08-22 LAB — HEPARIN LEVEL (UNFRACTIONATED): Heparin Unfractionated: 0.47 IU/mL (ref 0.30–0.70)

## 2018-08-22 MED ORDER — POLYETHYLENE GLYCOL 3350 17 G PO PACK: 17.0000 g | PACK | Freq: Every day | ORAL | 0 refills | Status: DC

## 2018-08-22 MED ORDER — ACETAMINOPHEN 325 MG PO TABS: 650.0000 mg | ORAL_TABLET | Freq: Four times a day (QID) | ORAL | Status: DC | PRN

## 2018-08-22 NOTE — Discharge Summary (Signed)
Evan Leonard   Patient ID: Evan Leonard MRN: [id number] DOB/AGE: 1960-06-19 58 y.o.  Admit date: 08/19/2018 Discharge date: 08/22/2018  Admitting Diagnosis: Incarcerated re-recurrent ventral hernia with partial SBO  Discharge Diagnosis Patient Active Problem List   Diagnosis Date Noted  . SBO (small bowel obstruction) ([name]) 08/20/2018  . Diabetic ulcer of right foot due to type 2 diabetes mellitus ([place]) 03/21/2018  . Atypical chest pain 02/09/2018  . Renal stone 05/12/2017  . S/P repair of recurrent ventral hernia 04/26/2017  . Chronic venous insufficiency 01/25/2015  . Diabetes mellitus with hyperglycemia ([place]) 03/31/2013  . Hyponatremia 03/23/2013  . Pulmonary embolism ([place]) 05/05/2012  . Morbid obesity with body mass index of 40.0-44.9 in adult [facility]) 05/05/2012  . Obstructive sleep apnea 05/05/2012  . DVT of left distal popliteal vein 05/05/2012    Consultants None  Imaging: Dg Abd 1 View  Result Date: 08/20/2018 CLINICAL DATA:  Small-bowel obstruction EXAM: ABDOMEN - 1 VIEW COMPARISON:  CT 08/20/2018 FINDINGS: Mild small bowel dilatation. Gas in nondilated colon. Stool in the colon. Gas in the rectum. Lumbar degenerative change IMPRESSION: Mild small bowel dilatation similar to recent CT. Electronically Signed   By: Franchot Gallo M.D.   On: 08/20/2018 08:30   Dg Abd Portable 1v  Result Date: 08/21/2018 CLINICAL DATA:  Small bowel obstruction EXAM: PORTABLE ABDOMEN - 1 VIEW COMPARISON:  Yesterday FINDINGS: There are 2 mildly dilated small bowel loops. Bowel gas pattern has overall improved from prior with less numerous and distended loops. Colon still contains gas and stool. Clear lung bases. No concerning mass effect or gas collection as permitted by body habitus IMPRESSION: Normalizing bowel gas pattern. Electronically Signed   By: Monte Fantasia M.D.   On: 08/21/2018 07:29    Procedures None  Hospital Course:  Geroge Leonard is a 58yo  male PMH h/o DVT/PT on eliquis, and h/o ex lap 2015 for incarcerated umbilical hernia and a second repair in 2018 when the hernia recurred, who presented to [facility] 3/26 with worsening periumbilical abdominal pain.  CT scan demonstrates likely small bowel obstruction originating with a chronically incarcerated recurrent ventral hernia. Patient was admit for bowel rest, fluid resuscitation, serial abdominal exams. His hernias remained freely reducible and soft. Follow up ray showed air and stool in colon. Abdominal pain resolved and diet was advanced as tolerated. On 3/28 the patient was voiding well, tolerating diet, having bowel function, ambulating well, pain well controlled, vital signs stable and felt stable for discharge home.  Patient will follow up as below and knows to call with questions or concerns.     Physical Exam: Gen:  Alert, NAD, pleasant HEENT: EOM's intact, pupils equal and round Card:  RRR Pulm:  CTAB, no W/R/R, effort normal Abd: Obese, soft, NT/ND, +BS; ventral hernias are all soft, nontender, and no overlying skin changes Ext:  Calves soft and nontender Psych: A&Ox3  Skin: no rashes noted, warm and dry  Allergies as of 08/22/2018   No Known Allergies     Medication List    STOP taking these medications   HYDROcodone-acetaminophen 5-325 MG tablet Commonly known as:  NORCO/VICODIN   mupirocin ointment 2 % Commonly known as:  BACTROBAN     TAKE these medications   acetaminophen 325 MG tablet Commonly known as:  TYLENOL Take 2 tablets (650 mg total) by mouth every 6 (six) hours as needed for mild pain.   Eliquis 5 MG Tabs tablet Generic drug:  apixaban TAKE 1 TABLET BY  MOUTH TWICE A DAY   furosemide 40 MG tablet Commonly known as:  Lasix Take 1 tablet (40 mg total) by mouth daily as needed for fluid or edema.   ibuprofen 200 MG tablet Commonly known as:  ADVIL,MOTRIN Take 400 mg by mouth every 6 (six) hours as needed for moderate pain.   lisinopril 2.5 MG  tablet Commonly known as:  PRINIVIL,ZESTRIL TAKE 1 TABLET BY MOUTH EVERY DAY   metFORMIN 500 MG tablet Commonly known as:  GLUCOPHAGE Take 2 tablets (1,000 mg total) by mouth 2 (two) times daily with a meal.   multivitamin with minerals Tabs tablet Take 1 tablet by mouth daily.   polyethylene glycol packet Commonly known as:  MIRALAX / GLYCOLAX Take 17 g by mouth daily.        Follow-up Information    [name], MD. Call.   Specialty:  Family Medicine Why:  call to arrange post-hospitalization follow up appointment Contact information: [place] [address] [phone]        [facility] [place]. Call.   Specialty:  General Surgery Why:  as needed, you do not have to schedule an appointment Contact information: [address] [place] [phone]          Signed: Wellington Hampshire, [facility] 08/22/2018, 7:47 AM Pager: [phone] Mon-Thurs 7:00 am-4:30 pm Fri 7:00 am -11:30 AM Sat-Sun 7:00 am-11:30 am

## 2018-08-22 NOTE — Discharge Instructions (Signed)
Ventral Hernia    A ventral hernia is a bulge of tissue from inside the abdomen that pushes through a weak area of the muscles that form the front wall of the abdomen. The tissues inside the abdomen are inside a sac (peritoneum). These tissues include the small intestine, large intestine, and the fatty tissue that covers the intestines (omentum). Sometimes, the bulge that forms a hernia contains intestines. Other hernias contain only fat. Ventral hernias do not go away without surgical treatment.  There are several types of ventral hernias. You may have:   A hernia at an incision site from previous abdominal surgery (incisional hernia).   A hernia just above the belly button (epigastric hernia), or at the belly button (umbilical hernia). These types of hernias can develop from heavy lifting or straining.   A hernia that comes and goes (reducible hernia). It may be visible only when you lift or strain. This type of hernia can be pushed back into the abdomen (reduced).   A hernia that traps abdominal tissue inside the hernia (incarcerated hernia). This type of hernia does not reduce.   A hernia that cuts off blood flow to the tissues inside the hernia (strangulated hernia). The tissues can start to die if this happens. This is a very painful bulge that cannot be reduced. A strangulated hernia is a medical emergency.  What are the causes?  This condition is caused by abdominal tissue putting pressure on an area of weakness in the abdominal muscles.  What increases the risk?  The following factors may make you more likely to develop this condition:   Being male.   Being 60 or older.   Being overweight or obese.   Having had previous abdominal surgery, especially if there was an infection after surgery.   Having had an injury to the abdominal wall.   Having had several pregnancies.   Having a buildup of fluid inside the abdomen (ascites).  What are the signs or symptoms?  The only symptom of a ventral hernia  may be a painless bulge in the abdomen. A reducible hernia may be visible only when you strain, cough, or lift. Other symptoms may include:   Dull pain.   A feeling of pressure.  Signs and symptoms of a strangulated hernia may include:   Increasing pain.   Nausea and vomiting.   Pain when pressing on the hernia.   The skin over the hernia turning red or purple.   Constipation.   Blood in the stool (feces).  How is this diagnosed?  This condition may be diagnosed based on:   Your symptoms.   Your medical history.   A physical exam. You may be asked to cough or strain while standing. These actions increase the pressure inside your abdomen and force the hernia through the opening in your muscles. Your health care provider may try to reduce the hernia by pressing on it.   Imaging studies, such as an ultrasound or CT scan.  How is this treated?  This condition is treated with surgery. If you have a strangulated hernia, surgery is done as soon as possible. If your hernia is small and not incarcerated, you may be asked to lose some weight before surgery.  Follow these instructions at home:   Follow instructions from your health care provider about eating or drinking restrictions.   If you are overweight, your health care provider may recommend that you increase your activity level and eat a healthier diet.     Do not lift anything that is heavier than 10 lb (4.5 kg).   Return to your normal activities as told by your health care provider. Ask your health care provider what activities are safe for you. You may need to avoid activities that increase pressure on your hernia.   Take over-the-counter and prescription medicines only as told by your health care provider.   Keep all follow-up visits as told by your health care provider. This is important.  Contact a health care provider if:   Your hernia gets larger.   Your hernia becomes painful.  Get help right away if:   Your hernia becomes increasingly  painful.   You have pain along with any of the following:  ? Changes in skin color in the area of the hernia.  ? Nausea.  ? Vomiting.  ? Fever.  Summary   A ventral hernia is a bulge of tissue from inside the abdomen that pushes through a weak area of the muscles that form the front wall of the abdomen.   This condition is treated with surgery, which may be urgent depending on your hernia.   Do not lift anything that is heavier than 10 lb (4.5 kg), and follow activity instructions from your health care provider.  This information is not intended to replace advice given to you by your health care provider. Make sure you discuss any questions you have with your health care provider.  Document Released: 04/29/2012 Document Revised: 06/25/2017 Document Reviewed: 12/02/2016  Elsevier Interactive Patient Education  2019 [employer]

## 2018-08-22 NOTE — Progress Notes (Signed)
Evan Leonard to be D/C'd  per MD order. Discussed with the patient and all questions fully answered.  VSS, Skin clean, dry and intact without evidence of skin break down, no evidence of skin tears noted.  IV catheter discontinued intact. Site without signs and symptoms of complications. Dressing and pressure applied.  An After Visit Summary was printed and given to the patient. Patient received prescription.  D/c education completed with patient/family including follow up instructions, medication list, d/c activities limitations if indicated, with other d/c instructions as indicated by MD - patient able to verbalize understanding, all questions fully answered.   Patient instructed to return to ED, call 911, or call MD for any changes in condition.   Patient to be escorted via [place], and D/C home via private auto.

## 2018-08-24 ENCOUNTER — Telehealth: Payer: Self-pay

## 2018-08-24 NOTE — Telephone Encounter (Signed)
LM for patient to return call for TCM call.

## 2018-10-05 ENCOUNTER — Telehealth: Payer: Self-pay | Admitting: Family Medicine

## 2018-10-05 NOTE — Telephone Encounter (Signed)
Copied from [place] [id number]. Topic: Quick Communication - See Telephone Encounter >> Oct 05, 2018  3:24 PM Sheran Luz wrote: CRM for notification. See Telephone encounter for: 10/05/18.  Patient called with complaints of burning with urination and cloudy urine with an odor. Patient denies fever. Patient inquired if a medication could be sent in without office visit, as he states he has a history of bladder infections. Please advise.

## 2018-10-05 NOTE — Telephone Encounter (Signed)
Evan Leonard, schedule mychart video visit. Patient has not been seen since Nov 2019

## 2018-10-05 NOTE — Telephone Encounter (Signed)
Patient has been schedule with Dr. Jerline Pain for 10/06/18 since Dr. Yong Channel was booked in the morning.

## 2018-10-06 ENCOUNTER — Telehealth (INDEPENDENT_AMBULATORY_CARE_PROVIDER_SITE_OTHER): Payer: PRIVATE HEALTH INSURANCE | Admitting: Family Medicine

## 2018-10-06 ENCOUNTER — Encounter: Payer: Self-pay | Admitting: Family Medicine

## 2018-10-06 DIAGNOSIS — R300 Dysuria: Secondary | ICD-10-CM | POA: Diagnosis not present

## 2018-10-06 MED ORDER — CEPHALEXIN 500 MG PO CAPS: 500.0000 mg | ORAL_CAPSULE | Freq: Two times a day (BID) | ORAL | 0 refills | Status: AC

## 2018-10-06 NOTE — Progress Notes (Signed)
    Chief Complaint:  Evan Leonard is a 58 y.o. male who presents today for a virtual office visit with a chief complaint of dysuria.   Assessment/Plan:  Dysuria Consistent with prior UTIs. Will start empiric keflex 500mg  bid x 7 days. Will check urine culture. Encouraged good oral hydration. Discussed warning signs and reasons to return to care and seek emergent care.     Subjective:  HPI:  Dysuria  Started 2 days ago. Associated with cloudy and foul smelling urine. Feels similar to prior UTIs. No fevers or chills. No back pain. No reported nausea, abdominal pain, or vomiting. No specific treatments tried. No other obvious alleviating or aggravating factors.   ROS: Per HPI  PMH: He reports that he has never smoked. He has never used smokeless tobacco. He reports that he does not drink alcohol or use drugs.      Objective/Observations  Physical Exam: Gen: NAD, resting comfortably  Virtual Visit via Video   I connected with Evan Leonard on 10/06/18 at  8:40 AM EDT by a video enabled telemedicine application and verified that I am speaking with the correct person using two identifiers. I discussed the limitations of evaluation and management by telemedicine and the availability of in person appointments. The patient expressed understanding and agreed to proceed. Due to technical difficulties, encounter was completed using audio only.   Patient location: Home Provider location: [place] participating in the virtual visit: Myself and Patient     Algis Greenhouse. Jerline Pain, MD 10/06/2018 10:11 AM

## 2018-11-09 ENCOUNTER — Ambulatory Visit (INDEPENDENT_AMBULATORY_CARE_PROVIDER_SITE_OTHER): Payer: PRIVATE HEALTH INSURANCE | Admitting: Family Medicine

## 2018-11-09 VITALS — BP 132/78 | HR 78 | Temp 98.0°F | Ht 74.5 in | Wt 354.8 lb

## 2018-11-09 DIAGNOSIS — R8290 Unspecified abnormal findings in urine: Secondary | ICD-10-CM

## 2018-11-09 DIAGNOSIS — R300 Dysuria: Secondary | ICD-10-CM

## 2018-11-09 LAB — POC URINALSYSI DIPSTICK (AUTOMATED)
Bilirubin, UA: NEGATIVE
Glucose, UA: NEGATIVE
Ketones, UA: NEGATIVE
Nitrite, UA: POSITIVE
Protein, UA: POSITIVE — AB
Spec Grav, UA: 1.025 (ref 1.010–1.025)
Urobilinogen, UA: 0.2 E.U./dL
pH, UA: 6 (ref 5.0–8.0)

## 2018-11-09 MED ORDER — NITROFURANTOIN MONOHYD MACRO 100 MG PO CAPS: 100.0000 mg | ORAL_CAPSULE | Freq: Two times a day (BID) | ORAL | 0 refills | Status: DC

## 2018-11-09 NOTE — Patient Instructions (Signed)
It was nice to see you!  You have a urinary tract infection. Please start the antibiotic.  We will check a urine culture to make sure you do not have a resistant bacteria. We will call you if we need to change your medications.   Please make sure you are drinking plenty of fluids over the next few days.  If your symptoms do not improve over the next 5-7 days, or if they worsen, please let us know. Please also let us know if you have worsening back pain, fevers, chills, or body aches.   Take care, Dr Chanoch Mccleery  

## 2018-11-09 NOTE — Progress Notes (Signed)
   Chief Complaint:  Evan Leonard is a 58 y.o. male who presents for same day appointment with a chief complaint of malodorous urine.   Assessment/Plan:  Dysuria History and UA consistent with UTI.  No signs of systemic infection or prostatitis.  Will start Macrobid as he recently completed a course of Keflex.  Encouraged good oral hydration.  Check urine culture.  Discussed reasons to return to care and seek emergent care.  Follow-up as needed.     Subjective:  HPI:  Malodorous urine, acute problem Started 3 days ago. Associated with dysuria, urgency, and hesitancy.  No back pain.  No nausea or vomiting.  No fevers or chills.  No malaise.  No perineal pain.  He completed a course of Keflex about a month ago for UTI however symptoms went away with treatment.  Has not tried anything since then.  No other obvious alleviating or aggravating factors.   ROS: Per HPI  PMH: He reports that he has never smoked. He has never used smokeless tobacco. He reports that he does not drink alcohol or use drugs.      Objective:  Physical Exam: BP 132/78 (BP Location: Left Arm, Patient Position: Sitting, Cuff Size: Large)   Pulse 78   Temp 98 F (36.7 C) (Oral)   Ht 6' 2.5" (1.892 m)   Wt (!) 354 lb 12 oz (160.9 kg)   SpO2 99%   BMI 44.94 kg/m   Gen: NAD, resting comfortably CV: Regular rate and rhythm with no murmurs appreciated Pulm: Normal work of breathing, clear to auscultation bilaterally with no crackles, wheezes, or rhonchi  Results for orders placed or performed in visit on 11/09/18 (from the past 24 hour(s))  POCT Urinalysis Dipstick (Automated)     Status: Abnormal   Collection Time: 11/09/18  2:55 PM  Result Value Ref Range   Color, UA Yellow    Clarity, UA Cloudy    Glucose, UA Negative Negative   Bilirubin, UA Negative    Ketones, UA Negative    Spec Grav, UA 1.025 1.010 - 1.025   Blood, UA 1+    pH, UA 6.0 5.0 - 8.0   Protein, UA Positive (A) Negative   Urobilinogen, UA  0.2 0.2 or 1.0 E.U./dL   Nitrite, UA Positive    Leukocytes, UA Large (3+) (A) Negative        [name] M. Jerline Pain, MD 11/09/2018 3:09 PM

## 2018-11-11 LAB — URINE CULTURE
MICRO NUMBER:: 569759
SPECIMEN QUALITY:: ADEQUATE

## 2018-11-12 NOTE — Progress Notes (Signed)
Please inform patient of the following:  Urine culture confirms UTI. Antibiotic he is on should treat it. Would like for him to let us know if symptoms are not improving.   Evan Leonard. Jerline Pain, MD 11/12/2018 11:34 AM

## 2019-03-10 IMAGING — CR DG ANKLE COMPLETE 3+V*R*
3 series · 3 of 3 positions shown · non-contrast
Comparison: Radiograph dated 02/02/2018

CLINICAL DATA: 56-year-old male with right and ankle swelling and
redness. Evaluate for cellulitis.

EXAM:
RIGHT ANKLE - COMPLETE 3+ VIEW

[ankle ap]
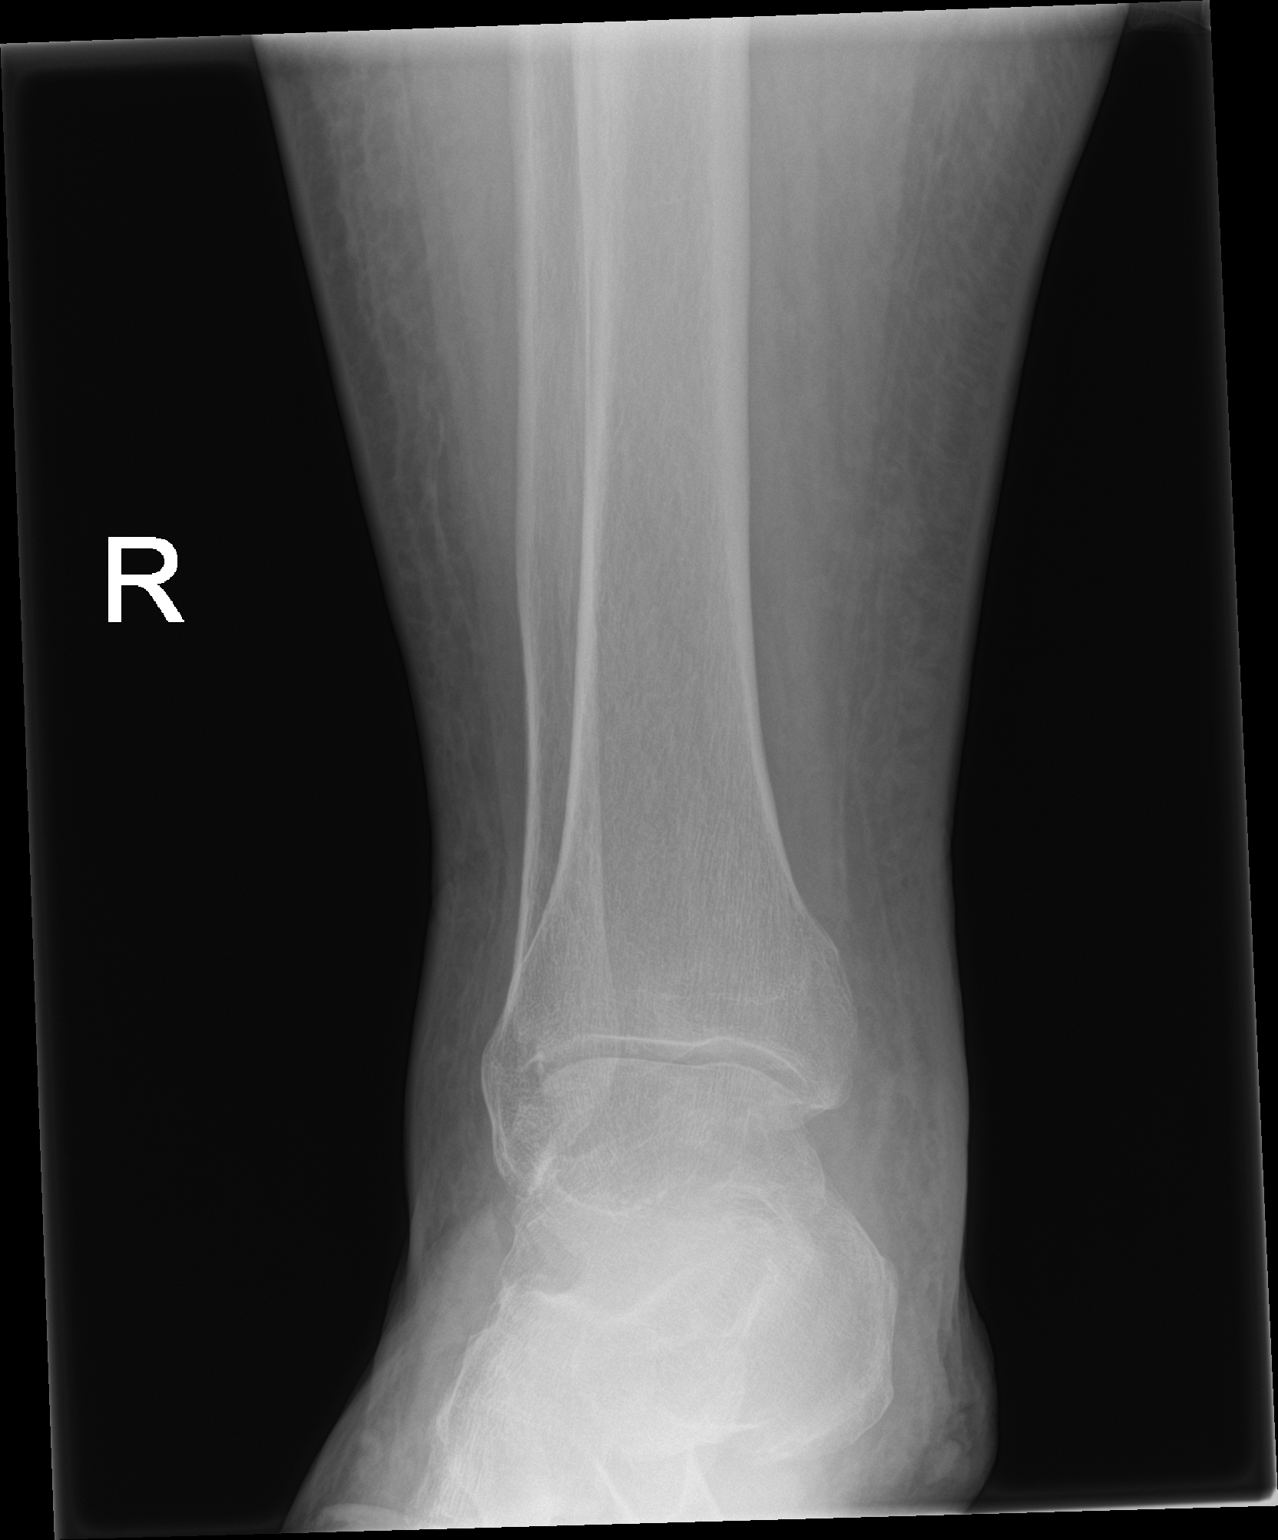

[ankle obl]
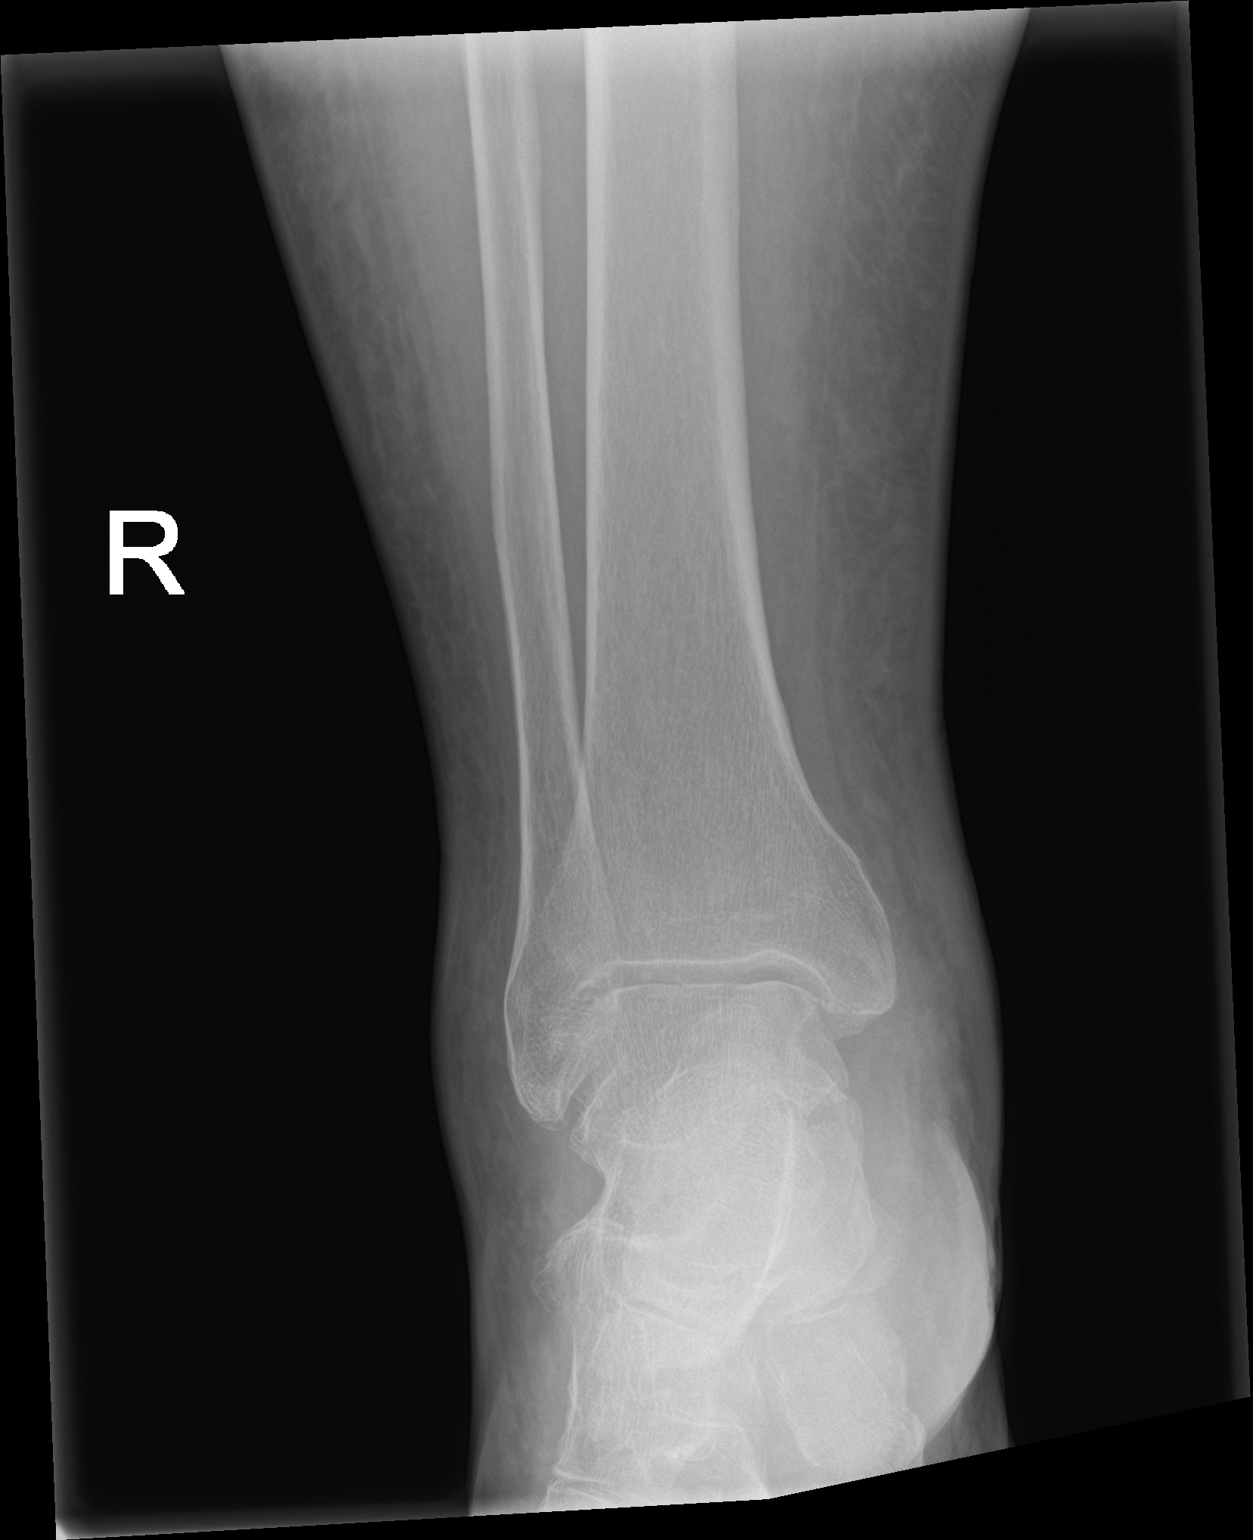

[ankle lat]
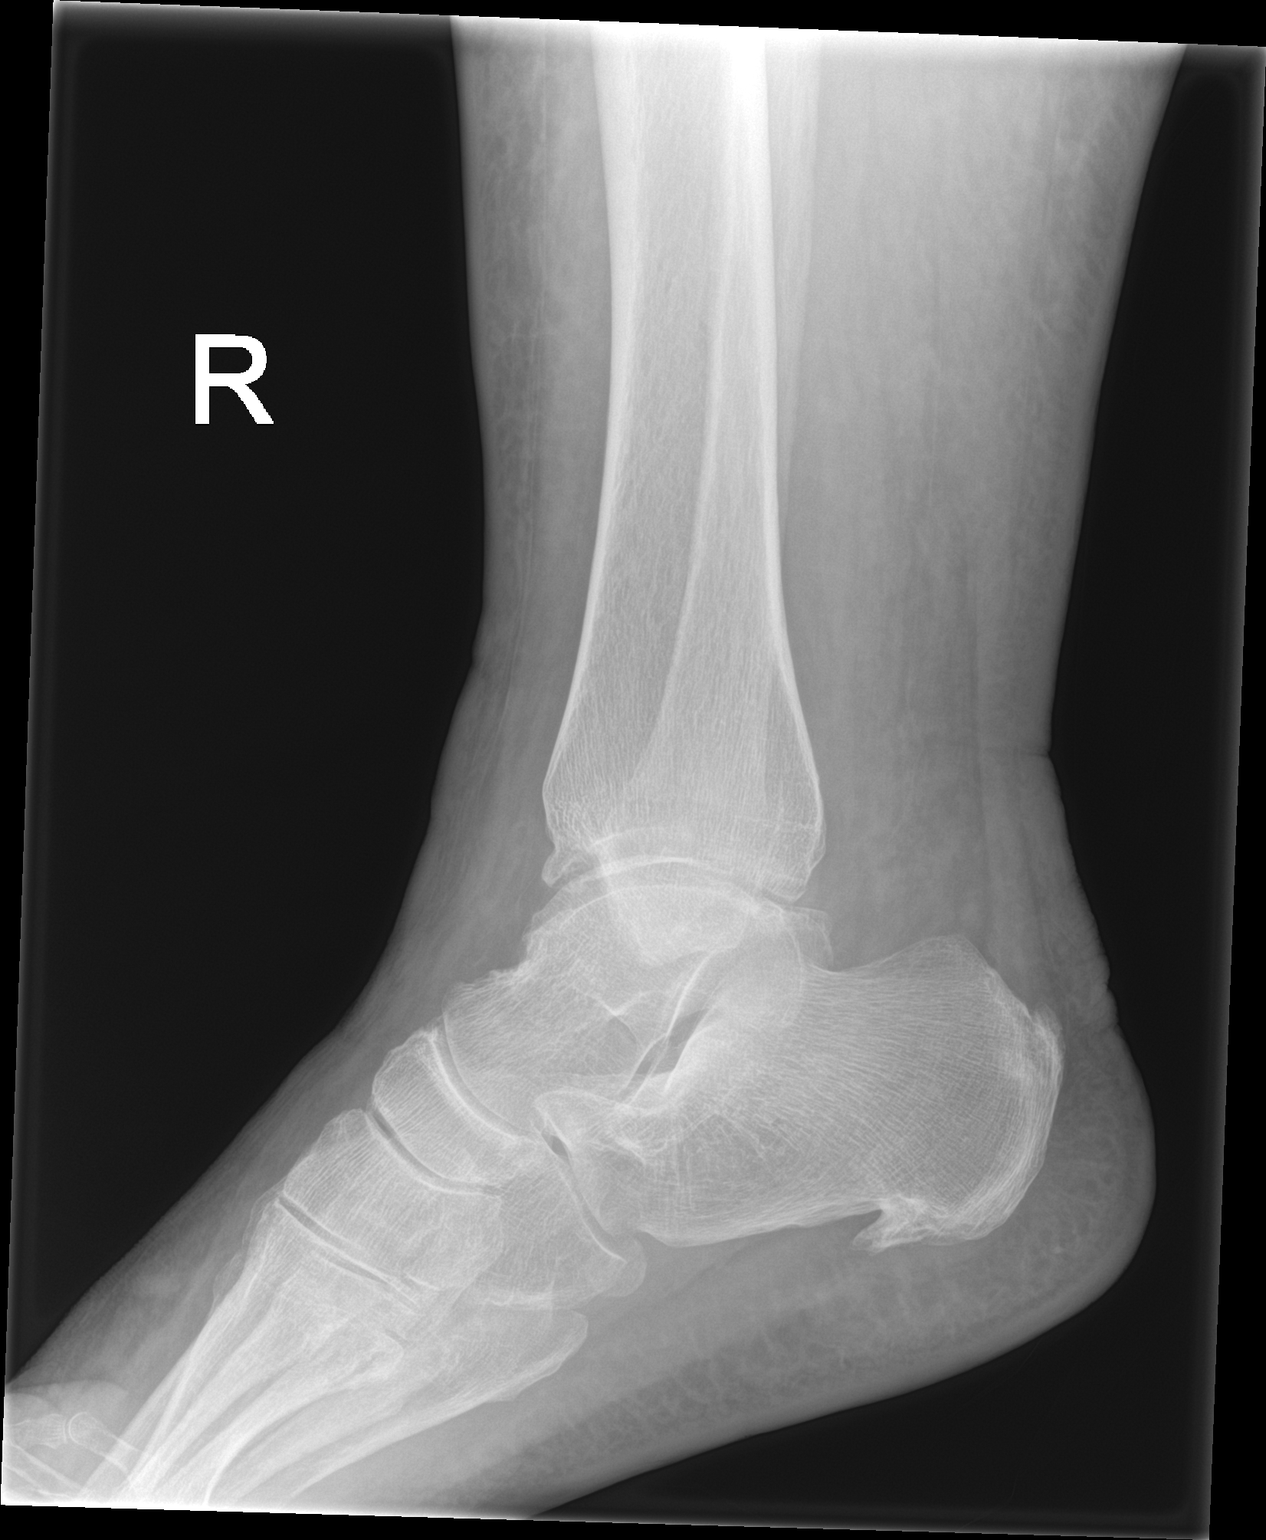

[3 of 3 positions shown; findings below may reference images not displayed]

FINDINGS: There is no acute fracture or dislocation. There is a 1 cm bone
spurring of the plantar calcaneus. There is diffuse subcutaneous
edema.
IMPRESSION: 1. No acute fracture or dislocation.
2. Diffuse subcutaneous edema, possibly cellulitis. Clinical
correlation is recommended.

## 2019-05-01 ENCOUNTER — Other Ambulatory Visit: Payer: Self-pay | Admitting: Family Medicine

## 2019-08-18 ENCOUNTER — Encounter: Payer: Self-pay | Admitting: Family Medicine

## 2019-08-18 ENCOUNTER — Ambulatory Visit: Payer: PRIVATE HEALTH INSURANCE | Admitting: Family Medicine

## 2019-08-18 ENCOUNTER — Other Ambulatory Visit: Payer: Self-pay

## 2019-08-18 VITALS — BP 130/84 | HR 84 | Temp 97.2°F | Ht 74.5 in | Wt 355.4 lb

## 2019-08-18 DIAGNOSIS — R300 Dysuria: Secondary | ICD-10-CM | POA: Diagnosis not present

## 2019-08-18 LAB — POCT URINALYSIS DIPSTICK
Bilirubin, UA: NEGATIVE
Blood, UA: POSITIVE
Glucose, UA: POSITIVE — AB
Ketones, UA: NEGATIVE
Nitrite, UA: NEGATIVE
Protein, UA: POSITIVE — AB
Spec Grav, UA: 1.025 (ref 1.010–1.025)
Urobilinogen, UA: 0.2 E.U./dL
pH, UA: 6 (ref 5.0–8.0)

## 2019-08-18 MED ORDER — NITROFURANTOIN MONOHYD MACRO 100 MG PO CAPS: 100.0000 mg | ORAL_CAPSULE | Freq: Two times a day (BID) | ORAL | 0 refills | Status: DC

## 2019-08-18 NOTE — Patient Instructions (Signed)
It was nice to see you!  You have a urinary tract infection. Please start the antibiotic.  We will check a urine culture to make sure you do not have a resistant bacteria. We will call you if we need to change your medications.   Please make sure you are drinking plenty of fluids over the next few days.  If your symptoms do not improve over the next 5-7 days, or if they worsen, please let us know. Please also let us know if you have worsening back pain, fevers, chills, or body aches.   Take care, Dr Ayanna Gheen  

## 2019-08-18 NOTE — Progress Notes (Signed)
   Evan Leonard is a 59 y.o. male who presents today for an office visit.  Assessment/Plan:  UTI Symptoms and UA consistent with UTI.  No signs of systemic infection.  Will check urine culture.  Will start Macrobid that this worked well for him a year ago.  Encourage good oral hydration.  Discussed reasons return to care.  Patient has not seen PCP in over a year.  Strongly advised him to follow-up with PCP ASAP for management of his chronic conditions.     Subjective:  HPI:  Symptoms started about 3 days ago. Worsening over that time. Symptoms include dysuria and cloudy urine. Also with a strong smell. No treatments tried. No fevers. Some abdominal pain. No back pain.  Similar to prior UTIs.       Objective:  Physical Exam: BP 130/84 (BP Location: Left Arm, Patient Position: Sitting, Cuff Size: Large)   Pulse 84   Temp (!) 97.2 F (36.2 C) (Temporal)   Ht 6' 2.5" (1.892 m)   Wt (!) 355 lb 6.4 oz (161.2 kg)   SpO2 96%   BMI 45.02 kg/m   Gen: No acute distress, resting comfortably CV: Regular rate and rhythm with no murmurs appreciated Pulm: Normal work of breathing, clear to auscultation bilaterally with no crackles, wheezes, or rhonchi Neuro: Grossly normal, moves all extremities Psych: Normal affect and thought content      Carmela Piechowski M. Jerline Pain, MD 08/18/2019 11:04 AM

## 2019-08-20 ENCOUNTER — Encounter (HOSPITAL_COMMUNITY): Payer: Self-pay | Admitting: Emergency Medicine

## 2019-08-20 ENCOUNTER — Inpatient Hospital Stay (HOSPITAL_COMMUNITY)
Admission: EM | Admit: 2019-08-20 | Discharge: 2019-08-23 | DRG: 690 | Disposition: A | Discharge status: Home / Self Care | Payer: PRIVATE HEALTH INSURANCE | Attending: Internal Medicine | Admitting: Internal Medicine

## 2019-08-20 DIAGNOSIS — K56609 Unspecified intestinal obstruction, unspecified as to partial versus complete obstruction: Secondary | ICD-10-CM

## 2019-08-20 DIAGNOSIS — N3000 Acute cystitis without hematuria: Secondary | ICD-10-CM

## 2019-08-20 DIAGNOSIS — Z833 Family history of diabetes mellitus: Secondary | ICD-10-CM

## 2019-08-20 DIAGNOSIS — G4733 Obstructive sleep apnea (adult) (pediatric): Secondary | ICD-10-CM | POA: Diagnosis present

## 2019-08-20 DIAGNOSIS — Z86711 Personal history of pulmonary embolism: Secondary | ICD-10-CM

## 2019-08-20 DIAGNOSIS — Z20822 Contact with and (suspected) exposure to covid-19: Secondary | ICD-10-CM | POA: Diagnosis present

## 2019-08-20 DIAGNOSIS — Z87440 Personal history of urinary (tract) infections: Secondary | ICD-10-CM

## 2019-08-20 DIAGNOSIS — E860 Dehydration: Secondary | ICD-10-CM | POA: Diagnosis present

## 2019-08-20 DIAGNOSIS — R109 Unspecified abdominal pain: Secondary | ICD-10-CM | POA: Diagnosis not present

## 2019-08-20 DIAGNOSIS — K432 Incisional hernia without obstruction or gangrene: Secondary | ICD-10-CM | POA: Diagnosis present

## 2019-08-20 DIAGNOSIS — B9620 Unspecified Escherichia coli [E. coli] as the cause of diseases classified elsewhere: Secondary | ICD-10-CM | POA: Diagnosis present

## 2019-08-20 DIAGNOSIS — Z6841 Body Mass Index (BMI) 40.0 and over, adult: Secondary | ICD-10-CM

## 2019-08-20 DIAGNOSIS — K460 Unspecified abdominal hernia with obstruction, without gangrene: Secondary | ICD-10-CM

## 2019-08-20 DIAGNOSIS — Z8719 Personal history of other diseases of the digestive system: Secondary | ICD-10-CM | POA: Diagnosis present

## 2019-08-20 DIAGNOSIS — E1165 Type 2 diabetes mellitus with hyperglycemia: Secondary | ICD-10-CM | POA: Diagnosis present

## 2019-08-20 DIAGNOSIS — E1151 Type 2 diabetes mellitus with diabetic peripheral angiopathy without gangrene: Secondary | ICD-10-CM | POA: Diagnosis present

## 2019-08-20 DIAGNOSIS — N390 Urinary tract infection, site not specified: Principal | ICD-10-CM | POA: Diagnosis present

## 2019-08-20 DIAGNOSIS — K760 Fatty (change of) liver, not elsewhere classified: Secondary | ICD-10-CM | POA: Diagnosis present

## 2019-08-20 DIAGNOSIS — Z7901 Long term (current) use of anticoagulants: Secondary | ICD-10-CM

## 2019-08-20 DIAGNOSIS — I10 Essential (primary) hypertension: Secondary | ICD-10-CM | POA: Diagnosis present

## 2019-08-20 DIAGNOSIS — Z86718 Personal history of other venous thrombosis and embolism: Secondary | ICD-10-CM

## 2019-08-20 LAB — URINE CULTURE
MICRO NUMBER:: 10288395
SPECIMEN QUALITY:: ADEQUATE

## 2019-08-20 MED ORDER — SODIUM CHLORIDE 0.9% FLUSH: 3.0000 mL | Freq: Once | INTRAVENOUS | Status: DC

## 2019-08-20 NOTE — ED Triage Notes (Signed)
Pt present with abd pain with known UTI, on abtx x 2 days. +nausea, denies fever. Sharp pain across mid abdomen.

## 2019-08-21 ENCOUNTER — Encounter (HOSPITAL_COMMUNITY): Payer: Self-pay | Admitting: General Surgery

## 2019-08-21 ENCOUNTER — Inpatient Hospital Stay (HOSPITAL_COMMUNITY): Payer: PRIVATE HEALTH INSURANCE

## 2019-08-21 ENCOUNTER — Emergency Department (HOSPITAL_COMMUNITY): Payer: PRIVATE HEALTH INSURANCE

## 2019-08-21 DIAGNOSIS — E1151 Type 2 diabetes mellitus with diabetic peripheral angiopathy without gangrene: Secondary | ICD-10-CM | POA: Diagnosis present

## 2019-08-21 DIAGNOSIS — Z87440 Personal history of urinary (tract) infections: Secondary | ICD-10-CM | POA: Diagnosis not present

## 2019-08-21 DIAGNOSIS — K56609 Unspecified intestinal obstruction, unspecified as to partial versus complete obstruction: Secondary | ICD-10-CM | POA: Diagnosis not present

## 2019-08-21 DIAGNOSIS — Z86711 Personal history of pulmonary embolism: Secondary | ICD-10-CM | POA: Diagnosis not present

## 2019-08-21 DIAGNOSIS — Z833 Family history of diabetes mellitus: Secondary | ICD-10-CM | POA: Diagnosis not present

## 2019-08-21 DIAGNOSIS — G4733 Obstructive sleep apnea (adult) (pediatric): Secondary | ICD-10-CM | POA: Diagnosis present

## 2019-08-21 DIAGNOSIS — K432 Incisional hernia without obstruction or gangrene: Secondary | ICD-10-CM | POA: Diagnosis present

## 2019-08-21 DIAGNOSIS — I10 Essential (primary) hypertension: Secondary | ICD-10-CM | POA: Diagnosis present

## 2019-08-21 DIAGNOSIS — Z20822 Contact with and (suspected) exposure to covid-19: Secondary | ICD-10-CM | POA: Diagnosis present

## 2019-08-21 DIAGNOSIS — E1165 Type 2 diabetes mellitus with hyperglycemia: Secondary | ICD-10-CM | POA: Diagnosis present

## 2019-08-21 DIAGNOSIS — B9620 Unspecified Escherichia coli [E. coli] as the cause of diseases classified elsewhere: Secondary | ICD-10-CM | POA: Diagnosis present

## 2019-08-21 DIAGNOSIS — K760 Fatty (change of) liver, not elsewhere classified: Secondary | ICD-10-CM | POA: Diagnosis present

## 2019-08-21 DIAGNOSIS — Z86718 Personal history of other venous thrombosis and embolism: Secondary | ICD-10-CM | POA: Diagnosis not present

## 2019-08-21 DIAGNOSIS — R109 Unspecified abdominal pain: Secondary | ICD-10-CM | POA: Diagnosis present

## 2019-08-21 DIAGNOSIS — E860 Dehydration: Secondary | ICD-10-CM | POA: Diagnosis present

## 2019-08-21 DIAGNOSIS — Z7901 Long term (current) use of anticoagulants: Secondary | ICD-10-CM | POA: Diagnosis not present

## 2019-08-21 DIAGNOSIS — N390 Urinary tract infection, site not specified: Secondary | ICD-10-CM | POA: Diagnosis present

## 2019-08-21 DIAGNOSIS — Z6841 Body Mass Index (BMI) 40.0 and over, adult: Secondary | ICD-10-CM | POA: Diagnosis not present

## 2019-08-21 LAB — CBG MONITORING, ED: Glucose-Capillary: 130 mg/dL — ABNORMAL HIGH (ref 70–99)

## 2019-08-21 LAB — CBC
HCT: 45.1 % (ref 39.0–52.0)
Hemoglobin: 14.6 g/dL (ref 13.0–17.0)
MCH: 29.3 pg (ref 26.0–34.0)
MCHC: 32.4 g/dL (ref 30.0–36.0)
MCV: 90.4 fL (ref 80.0–100.0)
Platelets: 240 10*3/uL (ref 150–400)
RBC: 4.99 MIL/uL (ref 4.22–5.81)
RDW: 13.9 % (ref 11.5–15.5)
WBC: 9.2 10*3/uL (ref 4.0–10.5)
nRBC: 0 % (ref 0.0–0.2)

## 2019-08-21 LAB — URINALYSIS, ROUTINE W REFLEX MICROSCOPIC
Bilirubin Urine: NEGATIVE
Glucose, UA: NEGATIVE mg/dL
Ketones, ur: NEGATIVE mg/dL
Nitrite: POSITIVE — AB
Protein, ur: NEGATIVE mg/dL
Specific Gravity, Urine: 1.018 (ref 1.005–1.030)
WBC, UA: >50 WBC/hpf — ABNORMAL HIGH (ref 0–5)
pH: 5 (ref 5.0–8.0)

## 2019-08-21 LAB — LIPASE, BLOOD: Lipase: 19 U/L (ref 11–51)

## 2019-08-21 LAB — COMPREHENSIVE METABOLIC PANEL
ALT: 20 U/L (ref 0–44)
AST: 18 U/L (ref 15–41)
Albumin: 3.7 g/dL (ref 3.5–5.0)
Alkaline Phosphatase: 63 U/L (ref 38–126)
Anion gap: 15 (ref 5–15)
BUN: 19 mg/dL (ref 6–20)
CO2: 21 mmol/L — ABNORMAL LOW (ref 22–32)
Calcium: 9.1 mg/dL (ref 8.9–10.3)
Chloride: 102 mmol/L (ref 98–111)
Creatinine, Ser: 1.16 mg/dL (ref 0.61–1.24)
GFR calc Af Amer: >60 mL/min (ref >60)
GFR calc non Af Amer: >60 mL/min (ref >60)
Glucose, Bld: 191 mg/dL — ABNORMAL HIGH (ref 70–99)
Potassium: 3.7 mmol/L (ref 3.5–5.1)
Sodium: 138 mmol/L (ref 135–145)
Total Bilirubin: 1.1 mg/dL (ref 0.3–1.2)
Total Protein: 7.6 g/dL (ref 6.5–8.1)

## 2019-08-21 LAB — HIV ANTIBODY (ROUTINE TESTING W REFLEX): HIV Screen 4th Generation wRfx: NONREACTIVE

## 2019-08-21 LAB — RESPIRATORY PANEL BY RT PCR (FLU A&B, COVID)
Influenza A by PCR: NEGATIVE
Influenza B by PCR: NEGATIVE
SARS Coronavirus 2 by RT PCR: NEGATIVE

## 2019-08-21 LAB — GLUCOSE, CAPILLARY: Glucose-Capillary: 100 mg/dL — ABNORMAL HIGH (ref 70–99)

## 2019-08-21 MED ORDER — ONDANSETRON 4 MG PO TBDP
4.0000 mg | ORAL_TABLET | Freq: Four times a day (QID) | ORAL | Status: DC | PRN
  Administered 2019-08-23: 4 mg via ORAL
  Filled 2019-08-21: qty 1

## 2019-08-21 MED ORDER — SODIUM CHLORIDE 0.9 % IV SOLN: INTRAVENOUS | Status: DC

## 2019-08-21 MED ORDER — DIATRIZOATE MEGLUMINE & SODIUM 66-10 % PO SOLN
90.0000 mL | Freq: Once | ORAL | Status: AC
  Administered 2019-08-21: 90 mL via NASOGASTRIC
  Filled 2019-08-21: qty 90

## 2019-08-21 MED ORDER — SODIUM CHLORIDE 0.9 % IV BOLUS (SEPSIS)
1000.0000 mL | Freq: Once | INTRAVENOUS | Status: AC
  Administered 2019-08-21: 1000 mL via INTRAVENOUS

## 2019-08-21 MED ORDER — MORPHINE SULFATE (PF) 2 MG/ML IV SOLN
2.0000 mg | INTRAVENOUS | Status: DC | PRN
  Administered 2019-08-21: 2 mg via INTRAVENOUS
  Filled 2019-08-21: qty 1

## 2019-08-21 MED ORDER — ONDANSETRON HCL 4 MG/2ML IJ SOLN
4.0000 mg | Freq: Once | INTRAMUSCULAR | Status: AC
  Administered 2019-08-21: 4 mg via INTRAVENOUS
  Filled 2019-08-21: qty 2

## 2019-08-21 MED ORDER — SODIUM CHLORIDE 0.9 % IV SOLN
1.0000 g | INTRAVENOUS | Status: DC
  Administered 2019-08-22 – 2019-08-23 (×2): 1 g via INTRAVENOUS
  Filled 2019-08-21: qty 10
  Filled 2019-08-21: qty 1

## 2019-08-21 MED ORDER — ONDANSETRON HCL 4 MG/2ML IJ SOLN
4.0000 mg | Freq: Four times a day (QID) | INTRAMUSCULAR | Status: DC | PRN
  Administered 2019-08-21: 4 mg via INTRAVENOUS
  Filled 2019-08-21: qty 2

## 2019-08-21 MED ORDER — MORPHINE SULFATE (PF) 4 MG/ML IV SOLN
4.0000 mg | Freq: Once | INTRAVENOUS | Status: AC
  Administered 2019-08-21: 4 mg via INTRAVENOUS
  Filled 2019-08-21: qty 1

## 2019-08-21 MED ORDER — INSULIN ASPART 100 UNIT/ML ~~LOC~~ SOLN: 0.0000 [IU] | Freq: Every day | SUBCUTANEOUS | Status: DC

## 2019-08-21 MED ORDER — ACETAMINOPHEN 325 MG PO TABS: 650.0000 mg | ORAL_TABLET | Freq: Four times a day (QID) | ORAL | Status: DC | PRN

## 2019-08-21 MED ORDER — ACETAMINOPHEN 650 MG RE SUPP: 650.0000 mg | Freq: Four times a day (QID) | RECTAL | Status: DC | PRN

## 2019-08-21 MED ORDER — SODIUM CHLORIDE 0.9 % IV SOLN
1.0000 g | Freq: Once | INTRAVENOUS | Status: AC
  Administered 2019-08-21: 1 g via INTRAVENOUS
  Filled 2019-08-21: qty 10

## 2019-08-21 MED ORDER — IOHEXOL 300 MG/ML  SOLN
100.0000 mL | Freq: Once | INTRAMUSCULAR | Status: AC | PRN
  Administered 2019-08-21: 100 mL via INTRAVENOUS

## 2019-08-21 MED ORDER — INSULIN ASPART 100 UNIT/ML ~~LOC~~ SOLN
0.0000 [IU] | Freq: Three times a day (TID) | SUBCUTANEOUS | Status: DC
  Administered 2019-08-21 – 2019-08-23 (×4): 2 [IU] via SUBCUTANEOUS

## 2019-08-21 NOTE — ED Notes (Signed)
PIV initiated, 18G to [place]. IV flushes with 10 cc NS without s/s of infiltration. Positive blood return noted. Secured with tape and tegaderm. Meds given per MAR. Name/DOB verified with pt

## 2019-08-21 NOTE — ED Notes (Signed)
Pt back from CT without incidence 

## 2019-08-21 NOTE — Consult Note (Signed)
Evan Leonard Aug 20, 1960  [id number].    Requesting MD: Dr. 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  The patient was noted to have urinary symptoms several days ago and was evaluated by his primary care physician.  He was found to have urinary tract infection and cultures revealed E. coli.  He was placed on antibiotic therapy for this just 2 days ago.  Yesterday he was at work and had some intermittent nausea with some intermittent abdominal pain.  It would come in away create nausea and then dissipate.  He has continued to pass gas and had a bowel movement yesterday.  He has not had any emesis.  He denies any fevers, chills, chest pain, shortness of breath.  He presented to the emergency department for evaluation where he has a CT scan that is suggesting a possible bowel obstruction secondary to this ventral incisional hernia.  His labs are normal with a normal white blood cell count.  He is afebrile.  He states that he is having no further nausea and his pain is only minimal.  He states this does not feel like his prior small bowel obstructions.  We have been asked to evaluate the patient for further evaluation and recommendations.  ROS: ROS: Please see HPI, otherwise all other systems have been reviewed and are negative.  Family History  Problem Relation Age of Onset  . Diabetes Mother        died age 44- also bleeding ulcer  . Hypertension Mother   . Heart failure Father        in his 32s (patient was 76). day before surgery planned  . Other Maternal Grandmother        states natural causes all grandparents    Past Medical History:  Diagnosis Date  . Diabetes mellitus, type II  ([facility]) 02/2013  . Diabetic ulcer of right foot due to type 2 diabetes mellitus ([facility]) 03/21/2018  . DVT (deep venous thrombosis) ([facility]) 05/04/2012   LLE  . Exertional dyspnea 05/04/2012   "isolated episode" (05/05/2012)  . Hepatic steatosis   . History of chickenpox   . Hypertension 03/21/2018  . Obesity   . Obesity, Class III, BMI 40-49.9 (morbid obesity) ([facility]) 03/21/2018  . OSA on CPAP   . Peripheral vascular disease ([facility])    varicose veins  . Pulmonary embolism ([facility]) 05/04/2012   bilaterally  . Pyelonephritis 04/2017  . Small bowel obstruction ([facility]) 09/06/2015  . Varicose veins     Past Surgical History:  Procedure Laterality Date  . CYSTOSCOPY W/ URETERAL STENT PLACEMENT Right 05/12/2017   Procedure: CYSTOSCOPY WITH RETROGRADE PYELOGRAM/URETERAL STENT PLACEMENT;  Surgeon: [name], MD;  Location: [facility];  Service: Urology;  Laterality: Right;  . CYSTOSCOPY/URETEROSCOPY/HOLMIUM LASER/STENT PLACEMENT Right 07/29/2017   Procedure: CYSTOSCOPY RIGHT URETEROSCOPY HOLMIUM LASER STENT EXCHANGE;  Surgeon: [name], MD;  Location: [facility];  Service: Urology;  Laterality: Right;  . HERNIA REPAIR     2015  . LAPAROTOMY N/A 12/13/2013   Procedure: EXPLORATORY LAPAROTOMY Repair ventral hernia, without mesh, partial omentectomy;  Surgeon: Gwenyth Ober, MD;  Location: [facility];  Service: General;  Laterality: N/A;  . VENTRAL HERNIA REPAIR N/A 04/26/2017   Procedure: [place]  VENTRAL ADULT;  Surgeon: Georganna Skeans, MD;  Location: [facility];  Service: General;  Laterality: N/A;    Social History:  reports that he has never smoked. He has never used smokeless tobacco. He reports that he does not drink alcohol or use drugs.  Allergies: No Known Allergies  (Not in a hospital admission)    Physical Exam: Blood pressure (!) 147/84, pulse 80, temperature 98.7 F (37.1 C), temperature source Oral, resp. rate 16, SpO2 95 %. General: pleasant, morbidly obese white male who is laying in  bed in NAD HEENT: head is normocephalic, atraumatic.  Sclera are noninjected.  PERRL.  Ears and nose without any masses or lesions.  NG tube has already been placed with only clear output from water or ice chips.  No bile present in his NG tube.  Mouth is pink and moist Heart: regular, rate, and rhythm.  Normal s1,s2. No obvious murmurs, gallops, or rubs noted.  Palpable radial and pedal pulses bilaterally Lungs: CTAB, no wheezes, rhonchi, or rales noted.  Respiratory effort nonlabored Abd: soft, NT, ND, but morbidly obese, +BS, no masses.  Multiple scars on his abdomen from prior surgery.  What is thought to be his hernia is palpated but given his obesity this is difficult to really feel. MS: all 4 extremities are symmetrical with no cyanosis, clubbing.  He does have trace edema to his bilateral lower extremities. Skin: warm and dry with no masses, lesions, or rashes, but does have chronic venous stasis changes specifically to his right lower extremity. Neuro: Cranial nerves 2-12 grossly intact, sensation is normal throughout Psych: A&Ox3 with an appropriate affect.   Results for orders placed or performed during the hospital encounter of 08/20/19 (from the past 48 hour(s))  Urinalysis, Routine w reflex microscopic     Status: Abnormal   Collection Time: 08/20/19 11:13 PM  Result Value Ref Range   Color, Urine AMBER (A) YELLOW    Comment: BIOCHEMICALS MAY BE AFFECTED BY COLOR   APPearance HAZY (A) CLEAR   Specific Gravity, Urine 1.018 1.005 - 1.030   pH 5.0 5.0 - 8.0   Glucose, UA NEGATIVE NEGATIVE mg/dL   Hgb urine dipstick SMALL (A) NEGATIVE   Bilirubin Urine NEGATIVE NEGATIVE   Ketones, ur NEGATIVE NEGATIVE mg/dL   Protein, ur NEGATIVE NEGATIVE mg/dL   Nitrite POSITIVE (A) NEGATIVE   Leukocytes,Ua MODERATE (A) NEGATIVE   RBC / HPF 0-5 0 - 5 RBC/hpf   WBC, UA >50 (H) 0 - 5 WBC/hpf   Bacteria, UA MANY (A) NONE SEEN   Squamous Epithelial / LPF 0-5 0 - 5   WBC Clumps PRESENT    Mucus  PRESENT    Hyaline Casts, UA PRESENT     Comment: Performed at [facility], [address] [place], [place] [address]  Lipase, blood     Status: None   Collection Time: 08/20/19 11:26 PM  Result Value Ref Range   Lipase 19 11 - 51 U/L    Comment: Performed at [facility], [place] [address] [place], [place] [address]  Comprehensive metabolic panel     Status: Abnormal   Collection Time: 08/20/19 11:26 PM  Result Value Ref Range   Sodium 138 135 - 145 mmol/L   Potassium 3.7 3.5 - 5.1 mmol/L   Chloride 102 98 - 111 mmol/L   CO2 21 (L) 22 - 32 mmol/L   Glucose, Bld 191 (H) 70 - 99 mg/dL    Comment: Glucose reference range  applies only to samples taken after fasting for at least 8 hours.   BUN 19 6 - 20 mg/dL   Creatinine, Ser 1.16 0.61 - 1.24 mg/dL   Calcium 9.1 8.9 - 10.3 mg/dL   Total Protein 7.6 6.5 - 8.1 g/dL   Albumin 3.7 3.5 - 5.0 g/dL   AST 18 15 - 41 U/L   ALT 20 0 - 44 U/L   Alkaline Phosphatase 63 38 - 126 U/L   Total Bilirubin 1.1 0.3 - 1.2 mg/dL   GFR calc non Af Amer >60 >60 mL/min   GFR calc Af Amer >60 >60 mL/min   Anion gap 15 5 - 15    Comment: Performed at [facility], [place] [address] [place] [address]  CBC     Status: None   Collection Time: 08/20/19 11:26 PM  Result Value Ref Range   WBC 9.2 4.0 - 10.5 K/uL   RBC 4.99 4.22 - 5.81 MIL/uL   Hemoglobin 14.6 13.0 - 17.0 g/dL   HCT 45.1 39.0 - 52.0 %   MCV 90.4 80.0 - 100.0 fL   MCH 29.3 26.0 - 34.0 pg   MCHC 32.4 30.0 - 36.0 g/dL   RDW 13.9 11.5 - 15.5 %   Platelets 240 150 - 400 K/uL   nRBC 0.0 0.0 - 0.2 %    Comment: Performed at [facility], [place] [address] [place], [place] [address]   CT ABDOMEN PELVIS W CONTRAST  Result Date: 08/21/2019 CLINICAL DATA:  Initial evaluation for acute mid abdominal pain. EXAM: CT ABDOMEN AND PELVIS WITH CONTRAST TECHNIQUE: Multidetector CT imaging of the abdomen and pelvis was performed using the standard protocol following bolus  administration of intravenous contrast. CONTRAST:  138mL OMNIPAQUE IOHEXOL 300 MG/ML  SOLN COMPARISON:  Prior CT from 08/20/2018. FINDINGS: Lower chest: Visualized lung bases are clear. Hepatobiliary: Diffuse hypoattenuation of the liver consistent with steatosis. Gallbladder within normal limits. No biliary dilatation. Pancreas: Diffuse fatty infiltration the pancreas noted. Pancreas otherwise unremarkable. Spleen: Spleen within normal limits. Adrenals/Urinary Tract: Adrenal glands are normal. Kidneys equal in size with symmetric enhancement. 15 mm simple exophytic cyst extends from the posterior right kidney. No focal enhancing renal mass. Multiple scattered bilateral nonobstructive nephrolithiasis measuring up to 4-5 mm bilaterally. No ureterolithiasis or evidence for obstructive uropathy. Partially distended bladder within normal limits. Stomach/Bowel: Stomach partially distended without acute abnormality. There is a complex ventral hernia containing fat and several loops of both small and large bowel. Several dilated loops of small bowel with internal air-fluid level seen proximal to the hernia, consistent with associated obstruction. Additionally, mucosal enhancement with inflammatory stranding and small volume free fluid seen about 1 of the herniated loops of bowel in the right paramedian abdomen, concerning for possible incarceration or strangulation (series 3, image 86). No pneumatosis or portal venous gas. The herniated colon is noninflamed. Scattered colonic diverticulosis without evidence for acute diverticulitis. Normal appendix. Vascular/Lymphatic: Normal intravascular enhancement seen throughout the intra-abdominal aorta. No aneurysm. Mesenteric vessels patent proximally. No adenopathy. Reproductive: Prostate normal. Other: No free intraperitoneal air. Small volume free fluid within the hernia sac as above. No other free fluid within the abdomen and pelvis. Musculoskeletal: No acute osseous  abnormality. No discrete or worrisome osseous lesions. Multilevel degenerative spondylosis noted throughout the visualized thoracolumbar spine. IMPRESSION: 1. Complex ventral hernia containing several loops of small and large bowel with associated small bowel obstruction. Mucosal enhancement with inflammatory stranding and small volume free fluid about a  portion of the herniated small bowel concerning for possible incarceration and/or strangulation. Surgical consultation recommended. 2. Colonic diverticulosis without evidence for acute diverticulitis. 3. Bilateral nonobstructive nephrolithiasis. 4. Hepatic steatosis. Electronically Signed   By: Jeannine Boga M.D.   On: 08/21/2019 06:27   DG Abd Portable 1 View  Result Date: 08/21/2019 CLINICAL DATA:  NG tube placed EXAM: PORTABLE ABDOMEN - 1 VIEW COMPARISON:  08/21/2018 FINDINGS: Nasogastric tube with the tip projecting over the stomach. There is no bowel dilatation to suggest obstruction. There is no evidence of pneumoperitoneum, portal venous gas or pneumatosis. There are no pathologic calcifications along the expected course of the ureters. The osseous structures are unremarkable. IMPRESSION: Nasogastric tube with the tip projecting over the stomach. Electronically Signed   By: Kathreen Devoid   On: 08/21/2019 07:47      Assessment/Plan DM HTN Morbid obesity History of DVT/PE -on Eliquis at home.  This will need to be held in case he needs any type of operative intervention.  Also would consider discussion if Eliquis is truly needed 5 years after a history of only 1 DVT.  Will defer to medicine. OSA Recurrent UTIs -currently with E. coli UTI.  Will defer to medical team for further treatment of this.  Complex incisional ventral hernia The patient has a complex incisional ventral hernia that has been repaired multiple times for prior small bowel obstructions.  The patient story is not very consistent with a recurrent small bowel obstruction  as he was only having intermittent nausea that has since dissipated and continues to pass flatus and had a bowel movement yesterday.  His CT scan suggest that he may have an obstruction secondary to this hernia however he really has minimal dilatation of small bowel proximal to this site.  His stomach is not distended at all on his CT scan.  The patient has already had an NG tube placed in the emergency department.  This is not putting out anything bilious consistent with an obstruction.  However, since this is in place, we will proceed with the small bowel obstruction protocol and give contrast and follow films.  If this contrast progresses then his NG tube can be removed at that time and his diet advanced as tolerated.  It is possible that his intermittent nausea and other symptoms may have been related to antibiotic therapy or something else entirely.  However, if the patient does not resolve and his protocol reveals a blockage, then we have discussed that he may require surgical intervention.  He understands all of this and is agreeable to proceed with this plan.  We will continue to follow this patient.   FEN -n.p.o./NG tube/IV fluids VTE -hold Eliquis ID -defer to medical team for UTI, but does not need for his hernia   Henreitta Cea, [facility] Surgery 08/21/2019, 8:01 AM Please see Amion for pager number during day hours 7:00am-4:30pm or 7:00am -11:30am on weekends

## 2019-08-21 NOTE — ED Notes (Signed)
X-ray at bedside

## 2019-08-21 NOTE — ED Notes (Signed)
Pt taken to CT scan in NAD 

## 2019-08-21 NOTE — ED Notes (Signed)
Care endorsed to [facility], RN

## 2019-08-21 NOTE — ED Notes (Addendum)
Pt to ED Rm 28 from [place]. Pt is A&Ox4, VSS on monitors. Speaking in full sentences. Breathing easy, non-labored. Pt reports he was started on abx on Tuesday for a UTI after having "dark urine." Pt denies hematuria/urinary frequency/dysuria. Pt states he felt nauseated and fatigued all day at work with new onset mid abdominal pain. Denies flank pain/suprabupic pain. Denies CP/SOB/V/D

## 2019-08-21 NOTE — H&P (Signed)
History and Physical    Evan Leonard [id number] DOB: 03-01-1961 DOA: 08/20/2019  PCP: [name], MD Consultants:  [name] - urology Patient coming from:  Home - lives alone; NOK: Rae Roam, [phone]  Chief Complaint: Abdominal pain with nausea  HPI: Evan Leonard is a 60 y.o. male with medical history significant of SBO (2017); PE (2013, on Eliquis); PVD; OSA on CPAP; morbid obesity (BMI 54); HTN; h/o incarcerated hernia; fatty liver; and DM with h/o R foot ulcer presenting with recent diagnosis of UTI and current abdominal pain with nausea.  He reports that he has had a UTI but also abdominal pain across his stomach.  He went to his PCP office and was given antibiotics.  It got worse last night at work with severe nausea, ?dehydration.  Nothing seemed to make it worse.  The nausea improved with medication.  He had hernia surgery several years ago.  Last BM was yesterday am and normal.  +flatus.  His urine was cloudy with a strong odor and got darker.  He was having small voids but no dysuria.  He reports 1 prior episode of VTE. He had a PE and US showed a DVT.  He was initially on [facility] but had transitioned to 81 mg ASA daily.  During his last hospitalization for SBO, he was started back on Eliquis.  He does not think he had a clot at that time and is uncertain why the Eliquis was started.   ED Course:  Concern for incarcerated hernia, placed NG tube.  Surgery has seen but does not anticipate surgery.  ?does he still need Eliquis.  Also with UTI, treated with antibiotics.  Review of Systems: As per HPI; otherwise review of systems reviewed and negative.   Ambulatory Status:  Ambulates without assistance  Past Medical History:  Diagnosis Date  . Diabetes mellitus, type II ([place]) 02/2013  . Diabetic ulcer of right foot due to type 2 diabetes mellitus ([place]) 03/21/2018  . DVT (deep venous thrombosis) ([place]) 05/04/2012   LLE  . Exertional dyspnea 05/04/2012   "isolated  episode" (05/05/2012)  . Hepatic steatosis   . History of chickenpox   . Hypertension 03/21/2018  . Obesity   . Obesity, Class III, BMI 40-49.9 (morbid obesity) ([place]) 03/21/2018  . OSA on CPAP   . Peripheral vascular disease (HCC)    varicose veins  . Pulmonary embolism ([place]) 05/04/2012   bilaterally  . Pyelonephritis 04/2017  . Small bowel obstruction ([place]) 09/06/2015  . Varicose veins     Past Surgical History:  Procedure Laterality Date  . CYSTOSCOPY W/ URETERAL STENT PLACEMENT Right 05/12/2017   Procedure: CYSTOSCOPY WITH RETROGRADE PYELOGRAM/URETERAL STENT PLACEMENT;  Surgeon: [name], MD;  Location: [place];  Service: Urology;  Laterality: Right;  . CYSTOSCOPY/URETEROSCOPY/HOLMIUM LASER/STENT PLACEMENT Right 07/29/2017   Procedure: CYSTOSCOPY RIGHT URETEROSCOPY HOLMIUM LASER STENT EXCHANGE;  Surgeon: [name], MD;  Location: [facility];  Service: Urology;  Laterality: Right;  . HERNIA REPAIR     2015  . LAPAROTOMY N/A 12/13/2013   Procedure: EXPLORATORY LAPAROTOMY Repair ventral hernia, without mesh, partial omentectomy;  Surgeon: Gwenyth Ober, MD;  Location: [place];  Service: General;  Laterality: N/A;  . VENTRAL HERNIA REPAIR N/A 04/26/2017   Procedure: INCARCERATED HERNIA REPAIR VENTRAL ADULT;  Surgeon: Georganna Skeans, MD;  Location: [place];  Service: General;  Laterality: N/A;    Social History   Socioeconomic History  . Marital status: Single    Spouse name: Not on  file  . Number of children: Not on file  . Years of education: Not on file  . Highest education level: Not on file  Occupational History  . 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 Smoking status: Never Smoker  . Smokeless tobacco: Never Used  Substance and Sexual Activity  . Alcohol use: No    Alcohol/week: 0.0 standard drinks  . Drug use: No  . Sexual activity: Not Currently  Other Topics Concern  . Not on file  Social History Narrative    Single. Lives alone. Friends that live close. No pets.       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       Hobbies: golf, basketball, walking   Tired from work right now   Investment banker, operational of Radio broadcast assistant Strain:   . Difficulty of Paying Living Expenses:   Food Insecurity:   . Worried About Charity fundraiser in the Last Year:   . Arboriculturist in the Last Year:   Transportation Needs:   . Film/video editor (Medical):   Marland Kitchen Lack of Transportation (Non-Medical):   Physical Activity:   . Days of Exercise per Week:   . Minutes of Exercise per Session:   Stress:   . Feeling of Stress :   Social Connections:   . Frequency of Communication with Friends and Family:   . Frequency of Social Gatherings with Friends and Family:   . Attends Religious Services:   . Active Member of Clubs or Organizations:   . Attends Archivist Meetings:   Marland Kitchen Marital Status:   Intimate Partner Violence:   . Fear of Current or Ex-Partner:   . Emotionally Abused:   Marland Kitchen Physically Abused:   . Sexually Abused:     No Known Allergies  Family History  Problem Relation Age of Onset  . Diabetes Mother        died age 23- also bleeding ulcer  . Hypertension Mother   . Heart failure Father        in his 35s (patient was 51). day before surgery planned  . Other Maternal Grandmother        states natural causes all grandparents    Prior to Admission medications   Medication Sig Start Date End Date Taking? Authorizing Provider  ELIQUIS 5 MG TABS tablet TAKE 1 TABLET BY MOUTH TWICE A DAY 05/03/19  Yes [name], MD  furosemide (LASIX) 40 MG tablet Take 1 tablet (40 mg total) by mouth daily as needed for fluid or edema. 02/09/18  Yes [name], MD  ibuprofen (ADVIL,MOTRIN) 200 MG tablet Take 400 mg by mouth every 6 (six) hours as needed for moderate pain.   Yes [provider]  metFORMIN (GLUCOPHAGE) 500 MG tablet Take 2 tablets (1,000 mg  total) by mouth 2 (two) times daily with a meal. 08/26/17  Yes [name], MD  Multiple Vitamin (MULTIVITAMIN WITH MINERALS) TABS tablet Take 1 tablet by mouth daily.    Yes [provider]  nitrofurantoin, macrocrystal-monohydrate, (MACROBID) 100 MG capsule Take 1 capsule (100 mg total) by mouth 2 (two) times daily. 08/18/19  Yes Vivi Barrack, MD    Physical Exam: Vitals:   08/21/19 0500 08/21/19 0515 08/21/19 0530 08/21/19 0715  BP: 129/80 131/79 125/71 (!) 147/84  Pulse: 82 84 79 80  Resp:    16  Temp:      TempSrc:      SpO2: 97% 94% 91% 95%     . General:  Appears calm and comfortable and is NAD . Eyes:  PERRL, EOMI, normal lids, iris . ENT:  grossly normal hearing, lips & tongue, mmm; appropriate dentition . Neck:  no LAD, masses or thyromegaly; no carotid bruits . Cardiovascular:  RRR, no m/r/g. No LE edema.  Marland Kitchen Respiratory:   CTA bilaterally with no wheezes/rales/rhonchi.  Normal respiratory effort. . Abdomen:  soft, NT, ND, NABS . Back:   normal alignment, no CVAT . Skin:  no rash or induration seen on limited exam . Musculoskeletal:  grossly normal tone BUE/BLE, good ROM, no bony abnormality . Lower extremity:  No LE edema.  Limited foot exam with no ulcerations.  2+ distal pulses. Marland Kitchen Psychiatric:  grossly normal mood and affect, speech fluent and appropriate, AOx3 . Neurologic:  CN 2-12 grossly intact, moves all extremities in coordinated fashion, sensation intact    Radiological Exams on Admission: CT ABDOMEN PELVIS W CONTRAST  Result Date: 08/21/2019 CLINICAL DATA:  Initial evaluation for acute mid abdominal pain. EXAM: CT ABDOMEN AND PELVIS WITH CONTRAST TECHNIQUE: Multidetector CT imaging of the abdomen and pelvis was performed using the standard protocol following bolus administration of intravenous contrast. CONTRAST:  130mL OMNIPAQUE IOHEXOL 300 MG/ML  SOLN COMPARISON:  Prior CT from 08/20/2018. FINDINGS: Lower chest: Visualized lung bases are  clear. Hepatobiliary: Diffuse hypoattenuation of the liver consistent with steatosis. Gallbladder within normal limits. No biliary dilatation. Pancreas: Diffuse fatty infiltration the pancreas noted. Pancreas otherwise unremarkable. Spleen: Spleen within normal limits. Adrenals/Urinary Tract: Adrenal glands are normal. Kidneys equal in size with symmetric enhancement. 15 mm simple exophytic cyst extends from the posterior right kidney. No focal enhancing renal mass. Multiple scattered bilateral nonobstructive nephrolithiasis measuring up to 4-5 mm bilaterally. No ureterolithiasis or evidence for obstructive uropathy. Partially distended bladder within normal limits. Stomach/Bowel: Stomach partially distended without acute abnormality. There is a complex ventral hernia containing fat and several loops of both small and large bowel. Several dilated loops of small bowel with internal air-fluid level seen proximal to the hernia, consistent with associated obstruction. Additionally, mucosal enhancement with inflammatory stranding and small volume free fluid seen about 1 of the herniated loops of bowel in the right paramedian abdomen, concerning for possible incarceration or strangulation (series 3, image 86). No pneumatosis or portal venous gas. The herniated colon is noninflamed. Scattered colonic diverticulosis without evidence for acute diverticulitis. Normal appendix. Vascular/Lymphatic: Normal intravascular enhancement seen throughout the intra-abdominal aorta. No aneurysm. Mesenteric vessels patent proximally. No adenopathy. Reproductive: Prostate normal. Other: No free intraperitoneal air. Small volume free fluid within the hernia sac as above. No other free fluid within the abdomen and pelvis. Musculoskeletal: No acute osseous abnormality. No discrete or worrisome osseous lesions. Multilevel degenerative spondylosis noted throughout the visualized thoracolumbar spine. IMPRESSION: 1. Complex ventral hernia  containing several loops of small and large bowel with associated small bowel obstruction. Mucosal enhancement with inflammatory stranding and small volume free fluid about a portion of the herniated small bowel concerning for possible incarceration and/or strangulation. Surgical consultation recommended. 2. Colonic diverticulosis without evidence for acute diverticulitis. 3. Bilateral nonobstructive nephrolithiasis. 4. Hepatic steatosis. Electronically Signed   By: Jeannine Boga M.D.   On: 08/21/2019 06:27   DG Abd Portable 1 View  Result Date: 08/21/2019 CLINICAL DATA:  NG tube placed EXAM: PORTABLE ABDOMEN - 1 VIEW COMPARISON:  08/21/2018 FINDINGS: Nasogastric tube with the  tip projecting over the stomach. There is no bowel dilatation to suggest obstruction. There is no evidence of pneumoperitoneum, portal venous gas or pneumatosis. There are no pathologic calcifications along the expected course of the ureters. The osseous structures are unremarkable. IMPRESSION: Nasogastric tube with the tip projecting over the stomach. Electronically Signed   By: Kathreen Devoid   On: 08/21/2019 07:47    EKG: not done   Labs on Admission: I have personally reviewed the available labs and imaging studies at the time of the admission.  Pertinent labs:   CO2 21 Glucose 191 Otherwise normal CMP Normal CBC UA: small Hgb, moderate LE, + nitrite, many bacteria, >50 WBC Urine culture from 3/25 + for E coli, pansensitive   Assessment/Plan Active Problems:   * No active hospital problems. *   SBO -Patient with prior admission in 11/2013 for repair of ventral/umbilical hernia with incarceration; he was found to have a necrotic omentum requiring partial omentectomy, as well -He returned with SBO in 08/2015 and 06/2016 and did not require surgical intervention on either occasion -He was again admission in 04/2017 and then required repair of an incarcerated ventral hernia -His most recent admission was in  07/2018 with an incarcerated re-recurrent ventral hernia with partial SBO that resolved with conservative measures -He presented overnight with acute onset of abdominal pain with nausea -CT showed a complex ventral hernia containing several loops of small and large bowel with associated small bowel obstruction. Mucosal enhancement with inflammatory stranding and small volume free fluid about a portion of the herniated small bowel concerning for possible incarceration and/or strangulation.  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  DVT prophylaxis:  SCDs Code Status:  Full - confirmed with patient Family Communication: None present; he did not request that I speak with his family at the time of admission. Disposition Plan: He is anticipated to d/c to home without [facility] services once his surgery issues have been resolved. Consults called: Surgery  Admission status: Admit -  It is my clinical opinion that admission to INPATIENT is reasonable and necessary because of the expectation that this patient will require hospital care that crosses at least 2 midnights to treat this condition based on the medical complexity of the problems presented.  Given the aforementioned information, the predictability of an adverse outcome is felt to be significant.    Karmen Bongo MD [facility]   How to contact the [facility] Attending or Consulting provider 7A -  7P or covering provider during after hours [place], for this patient?  1. Check the care team in [facility] and look for a) attending/consulting [facility] provider listed and b) the [facility] team listed 2. Log into [email or web address] and use [place]'s universal password to access. If you do not have the password, please contact the hospital operator. 3. Locate the [facility] provider you are looking for under [facility] and page to a number that you can be directly reached. 4. If you still have difficulty reaching the provider, please page the [facility] (Director on Call) for the Hospitalists listed on amion for assistance.   08/21/2019, 8:10 AM

## 2019-08-21 NOTE — ED Provider Notes (Signed)
TIME SEEN: 4:30 AM  CHIEF COMPLAINT: Abdominal pain, nausea  HPI: Patient is a 59 year old male with history of hypertension, diabetes, PE and DVT on Eliquis, UTI, obesity who presents emergency department with dark, cloudy, foul-smelling urine since Tuesday the 16th.  Was diagnosed with a UTI by his PCP and started on Macrobid by Dr. Jerline Pain with [facility] on 08/18/19.  Urine culture has grown greater than 100,000 colonies of of pansensitive E. coli.  Bacteria sensitive to Macrobid.  Macrobid has helped his symptoms in the past.  Reports yesterday starting having central abdominal pain which is abnormal for him when he has UTI.  No lower abdominal pain.  He has had nausea without vomiting or diarrhea.  No fever.  No flank pain.  History of kidney stones but states this feels different.  Has had previous ventral hernia repair years ago but no other abdominal surgery.  He was unsure if his pain was related to his hernia but states he is concerned it is "acting up again".  Denies dysuria, urinary frequency or urgency, hematuria.  No penile discharge.  No recent urologic procedures or urinary catheterization.  She is not sexually active.  No history of gonorrhea or chlamydia.  ROS: See HPI Constitutional: no fever  Eyes: no drainage  ENT: no runny nose   Cardiovascular:  no chest pain  Resp: no SOB  GI: no vomiting GU: no dysuria Integumentary: no rash  Allergy: no hives  Musculoskeletal: no leg swelling  Neurological: no slurred speech ROS otherwise negative  PAST MEDICAL HISTORY/PAST SURGICAL HISTORY:  Past Medical History:  Diagnosis Date  . Diabetes mellitus, type II ([place]) 02/2013  . Diabetic ulcer of right foot due to type 2 diabetes mellitus ([place]) 03/21/2018  . DVT (deep venous thrombosis) ([place]) 05/04/2012   LLE  . Exertional dyspnea 05/04/2012   "isolated episode" (05/05/2012)  . Hepatic steatosis   . History of chickenpox   . Hypertension 03/21/2018  . Obesity   . Obesity, Class  III, BMI 40-49.9 (morbid obesity) ([place]) 03/21/2018  . OSA on CPAP   . Peripheral vascular disease (HCC)    varicose veins  . Pulmonary embolism ([place]) 05/04/2012   bilaterally  . Pyelonephritis 04/2017  . Small bowel obstruction ([place]) 09/06/2015  . Varicose veins     MEDICATIONS:  Prior to Admission medications   Medication Sig Start Date End Date Taking? Authorizing Provider  acetaminophen (TYLENOL) 325 MG tablet Take 2 tablets (650 mg total) by mouth every 6 (six) hours as needed for mild pain. Patient not taking: Reported on 08/18/2019 08/22/18   Wellington Hampshire, PA-C  ELIQUIS 5 MG TABS tablet TAKE 1 TABLET BY MOUTH TWICE A DAY 05/03/19   [name], MD  furosemide (LASIX) 40 MG tablet Take 1 tablet (40 mg total) by mouth daily as needed for fluid or edema. 02/09/18   [name], MD  ibuprofen (ADVIL,MOTRIN) 200 MG tablet Take 400 mg by mouth every 6 (six) hours as needed for moderate pain.    [provider]  lisinopril (PRINIVIL,ZESTRIL) 2.5 MG tablet TAKE 1 TABLET BY MOUTH EVERY DAY 06/23/18   [name], MD  metFORMIN (GLUCOPHAGE) 500 MG tablet Take 2 tablets (1,000 mg total) by mouth 2 (two) times daily with a meal. 08/26/17   [name], MD  Multiple Vitamin (MULTIVITAMIN WITH MINERALS) TABS tablet Take 1 tablet by mouth daily.     [provider]  nitrofurantoin, macrocrystal-monohydrate, (MACROBID) 100 MG capsule Take  1 capsule (100 mg total) by mouth 2 (two) times daily. 08/18/19   Vivi Barrack, MD  polyethylene glycol [facility] Floria Raveling) packet Take 17 g by mouth daily. Patient not taking: Reported on 08/18/2019 08/22/18   Wellington Hampshire, PA-C    ALLERGIES:  No Known Allergies  SOCIAL HISTORY:  Social History   Tobacco Use  . Smoking status: Never Smoker  . Smokeless tobacco: Never Used  Substance Use Topics  . Alcohol use: No    Alcohol/week: 0.0 standard drinks    FAMILY HISTORY: Family History  Problem Relation Age of  Onset  . Diabetes Mother        died age 56- also bleeding ulcer  . Hypertension Mother   . Heart failure Father        in his 75s (patient was 68). day before surgery planned  . Other Maternal Grandmother        states natural causes all grandparents    EXAM: BP (!) 139/98 (BP Location: Left Wrist)   Pulse 77   Temp 98.7 F (37.1 C) (Oral)   Resp 18   SpO2 97%  CONSTITUTIONAL: Alert and oriented and responds appropriately to questions. Well-appearing; well-nourished, obese, in no distress HEAD: Normocephalic EYES: Conjunctivae clear, pupils appear equal, EOM appear intact ENT: normal nose; moist mucous membranes NECK: Supple, normal ROM CARD: RRR; S1 and S2 appreciated; no murmurs, no clicks, no rubs, no gallops RESP: Normal chest excursion without splinting or tachypnea; breath sounds clear and equal bilaterally; no wheezes, no rhonchi, no rales, no hypoxia or respiratory distress, speaking full sentences ABD/GI: Normal bowel sounds; non-distended; soft, tender in the mid abdomen, no suprapubic tenderness, no rebound, no guarding, no peritoneal signs, no hepatosplenomegaly, no overlying redness or warmth of the abdomen, no ecchymosis or necrosis, unable to appreciate any hernia but exam limited due to body habitus BACK:  The back appears normal EXT: Normal ROM in all joints; no deformity noted, no edema; no cyanosis SKIN: Normal color for age and race; warm; no rash on exposed skin NEURO: Moves all extremities equally PSYCH: The patient's mood and manner are appropriate.   MEDICAL DECISION MAKING: Patient here with abdominal pain and persistent UTI despite Macrobid.  Urine here appears infected.  Will give dose of IV Rocephin here.  He denies being sexually active and no penile discharge or history of STDs.  States he has not followed up for urology for these recurrent UTIs.  He does have history of kidney stones.  Will obtain CT of the abdomen pelvis to evaluate for further causes  of pain including recurrent hernia, SBO, kidney stone, pyelonephritis, appendicitis, colitis, diverticulitis.  Declines pain medicine at this time.  Will give IV fluids, Zofran for symptomatic relief.  Labs here unremarkable.  ED PROGRESS: Patient CT scan does show a complex ventral hernia containing several loops of small and large bowel with associated small bowel obstruction.  There is inflammatory stranding and small volume free fluid about a portion of the herniated small bowel that is concerning for possible incarceration and/or strangulation.  Will consult general surgery.  Will place NG tube.    He has diverticulosis without diverticulitis.  Bilateral nonobstructing nephrolithiasis without ureterolithiasis today.  Kidneys otherwise normal-appearing.  It appears he was admitted for similar symptoms in December 2018 and underwent surgery by Dr. Georganna Skeans.  Patient continues to decline pain medication.  6:50 AM  Spoke with Dr. Redmond Pulling with general surgery.  Patient will be seen by the  oncoming surgical team and they will determine if patient should be admitted to surgical service versus medicine.  It appears in 2018 he was admitted to the surgical service.  His PCP is Dr. Garret Reddish with [facility].  I reviewed all nursing notes and pertinent previous records as available.  I have interpreted any EKGs, lab and urine results, imaging (as available).  Evan Leonard was evaluated in Emergency Department on 08/21/2019 for the symptoms described in the history of present illness. He was evaluated in the context of the global COVID-19 pandemic, which necessitated consideration that the patient might be at risk for infection with the SARS-CoV-2 virus that causes COVID-19. Institutional protocols and algorithms that pertain to the evaluation of patients at risk for COVID-19 are in a state of rapid change based on information released by regulatory bodies including the CDC and federal and state  organizations. These policies and algorithms were followed during the patient's care in the ED.  Patient was seen wearing N95, face shield, gloves.    Sehaj Kolden, Delice Bison, DO 08/21/19 [phone]

## 2019-08-21 NOTE — ED Notes (Signed)
Size 72F NG tube inserted through R nare. Pt tolerated well. 200 cc of output noted upon initial insertion

## 2019-08-21 NOTE — ED Notes (Signed)
General Surgery at bedside.

## 2019-08-22 ENCOUNTER — Other Ambulatory Visit: Payer: Self-pay

## 2019-08-22 DIAGNOSIS — K56609 Unspecified intestinal obstruction, unspecified as to partial versus complete obstruction: Secondary | ICD-10-CM

## 2019-08-22 LAB — CBC
HCT: 41.2 % (ref 39.0–52.0)
Hemoglobin: 13.4 g/dL (ref 13.0–17.0)
MCH: 29.7 pg (ref 26.0–34.0)
MCHC: 32.5 g/dL (ref 30.0–36.0)
MCV: 91.4 fL (ref 80.0–100.0)
Platelets: 209 10*3/uL (ref 150–400)
RBC: 4.51 MIL/uL (ref 4.22–5.81)
RDW: 14.2 % (ref 11.5–15.5)
WBC: 7.7 10*3/uL (ref 4.0–10.5)
nRBC: 0 % (ref 0.0–0.2)

## 2019-08-22 LAB — GLUCOSE, CAPILLARY
Glucose-Capillary: 147 mg/dL — ABNORMAL HIGH (ref 70–99)
Glucose-Capillary: 135 mg/dL — ABNORMAL HIGH (ref 70–99)
Glucose-Capillary: 116 mg/dL — ABNORMAL HIGH (ref 70–99)
Glucose-Capillary: 115 mg/dL — ABNORMAL HIGH (ref 70–99)

## 2019-08-22 LAB — BASIC METABOLIC PANEL
Anion gap: 8 (ref 5–15)
BUN: 15 mg/dL (ref 6–20)
CO2: 25 mmol/L (ref 22–32)
Calcium: 8.7 mg/dL — ABNORMAL LOW (ref 8.9–10.3)
Chloride: 108 mmol/L (ref 98–111)
Creatinine, Ser: 0.95 mg/dL (ref 0.61–1.24)
GFR calc Af Amer: >60 mL/min (ref >60)
GFR calc non Af Amer: >60 mL/min (ref >60)
Glucose, Bld: 127 mg/dL — ABNORMAL HIGH (ref 70–99)
Potassium: 3.6 mmol/L (ref 3.5–5.1)
Sodium: 141 mmol/L (ref 135–145)

## 2019-08-22 LAB — HEMOGLOBIN A1C
Hgb A1c MFr Bld: 8.9 % — ABNORMAL HIGH (ref 4.8–5.6)
Mean Plasma Glucose: 208.73 mg/dL

## 2019-08-22 MED ORDER — CHLORHEXIDINE GLUCONATE CLOTH 2 % EX PADS
6.0000 | MEDICATED_PAD | Freq: Every day | CUTANEOUS | Status: DC
  Administered 2019-08-22: 6 via TOPICAL

## 2019-08-22 MED ORDER — SODIUM CHLORIDE 0.9% FLUSH: 10.0000 mL | INTRAVENOUS | Status: DC | PRN

## 2019-08-22 NOTE — Progress Notes (Signed)
PROGRESS NOTE    Evan Leonard DOB: 08/23/1960 DOA: 08/20/2019 PCP: Shelva Majestic, MD   Brief Narrative:  Evan Leonard is a 59 y.o. male with medical history significant of SBO (2017); PE (2013, on Eliquis); PVD; OSA on CPAP; morbid obesity (BMI 45); HTN; h/o incarcerated hernia; fatty liver; and DM with h/o R foot ulcer presenting with recent diagnosis of UTI and current abdominal pain with nausea.  He reports that he has had a UTI but also abdominal pain across his stomach.  He went to his PCP office and was given antibiotics.  It got worse last night at work with severe nausea, ?dehydration.  Nothing seemed to make it worse.  The nausea improved with medication.  He had hernia surgery several years ago.  Last BM was yesterday am and normal.  +flatus.  His urine was cloudy with a strong odor and got darker.  He was having small voids but no dysuria. He reports 1 prior episode of PE and US showed a DVT.  He was initially on [facility] but had transitioned to 81 mg ASA daily.  During his last hospitalization for SBO, he was started back on Eliquis. He does not think he had a clot at that time and is uncertain why the Eliquis was started.  Assessment & Plan:   Principal Problem:   SBO (small bowel obstruction) (HCC) Active Problems:   Morbid obesity with body mass index of 40.0-44.9 in adult [facility])   Obstructive sleep apnea   Diabetes mellitus with hyperglycemia (HCC)   Acute abdominal pain and nausea, SBO ruled out, POA -Patient with prior admission in 11/2013 for repair of ventral/umbilical hernia with incarceration; he was found to have a necrotic omentum requiring partial omentectomy, as well -He returned with SBO in 08/2015 and 06/2016 and did not require surgical intervention on either occasion -He was again admission in 04/2017 and then required repair of an incarcerated ventral hernia -His most recent admission was in 07/2018 with an incarcerated re-recurrent ventral hernia  with partial SBO that resolved with conservative measures -CT as below concerning for dilated bowel loops, however follow-through imaging shows contrast in the colon indicating patient does not likely have a true small bowel obstruction -Surgery following, NG tube to be removed, advanced diet to clear liquids, continue for the next 24 to 48 hours, subsequent disposition likely dependent p.o. 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  DM, uncontrolled -A1c 8.9 consistent with poorly controlled diabetes, lengthy discussion about need for medication compliance and dietary compliance -hold Glucophage while n.p.o. and possibly pending procedure -Cover with moderate-scale SSI  Morbid obesity Body mass index is 43.86 kg/m. -Lengthy discussion as above about need for medication compliance dietary compliance and increased activity levels as tolerated.  OSA on CPAP -Continue CPAP  DVT prophylaxis:  SCDs given possible procedure needed in the next 24 to 48 hours -patient on Eliquis at home this has been held Code Status:  Full Disposition Plan:  Pending clinical course  likely discharge at home in the next 24 to 48 hours  Consults called: Surgery  Admission status:  Remains inpatient continues to need further evaluation, imaging, and possible procedure with surgery pending clinical status and improvement in abdominal pain and tolerance of p.o.  Subjective: No acute issues or events overnight, indicates abdominal pain and urinary symptoms have all but resolved, can forward to advancing diet and having NG tube removed.  Denies headache, fevers, chills, nausea, vomiting, diarrhea, constipation.  Objective: Vitals:   08/21/19 1830 08/21/19 1845 08/21/19 1942 08/21/19 2237  BP: 121/87  134/86 (!) 143/80  Pulse: 80 83 85 76  Resp:   20 18  Temp:   98.3 F (36.8 C) 97.8 F (36.6 C)  TempSrc:   Oral Oral  SpO2: 95% 98% 96% 100%  Weight:   (!) 159.2 kg   Height:   6\' 3"  (1.905 m)     Intake/Output Summary (Last 24 hours) at 08/22/2019 0706 Last data filed at 08/22/2019 0525 Gross per 24 hour  Intake 955.63 ml  Output 1200 ml  Net -244.37 ml   Filed Weights   08/21/19 1942  Weight: (!) 159.2 kg    Examination:  General:  Pleasantly resting in bed, No acute distress. HEENT:  Normocephalic atraumatic.  Sclerae nonicteric, noninjected.  Extraocular movements intact bilaterally.  NG tube putting out dark brown liquid. Neck:  Without mass or deformity.  Trachea is midline. Lungs:  Clear to auscultate bilaterally without rhonchi, wheeze, or rales. Heart:  Regular rate and rhythm.  Without murmurs, rubs, or gallops. Abdomen:  Soft, minimally tender at the inferior mid abdomen, nondistended.  Without guarding or rebound. Extremities: Without cyanosis, clubbing, edema, or obvious deformity. Vascular:  Dorsalis pedis and posterior tibial pulses palpable bilaterally. Skin:  Warm and dry, no erythema, no ulcerations.  Data Reviewed: I have personally reviewed following labs and imaging studies  CBC: Recent Labs  Lab 08/20/19 2326 08/22/19 0102  WBC 9.2  7.7  HGB 14.6 13.4  HCT 45.1 41.2  MCV 90.4 91.4  PLT 240 209   Basic Metabolic Panel: Recent Labs  Lab 08/20/19 2326 08/22/19 0102  NA 138 141  K 3.7 3.6  CL 102 108  CO2 21* 25  GLUCOSE 191* 127*  BUN 19 15  CREATININE 1.16 0.95  CALCIUM 9.1 8.7*   GFR: Estimated Creatinine Clearance: 133.8 mL/min (by C-G formula based on SCr of 0.95 mg/dL). Liver Function Tests: Recent Labs  Lab 08/20/19 2326  AST 18  ALT 20  ALKPHOS 63  BILITOT 1.1  PROT 7.6  ALBUMIN 3.7   Recent Labs  Lab 08/20/19 2326  LIPASE 19   No results for input(s): AMMONIA in the last 168 hours. Coagulation Profile: No results for input(s): INR, PROTIME in the last 168 hours. Cardiac Enzymes: No results for input(s): CKTOTAL, CKMB, CKMBINDEX, TROPONINI in the last 168 hours. BNP (last 3 results) No results for input(s): PROBNP in the last 8760 hours. HbA1C: Recent Labs    08/22/19 0102  HGBA1C 8.9*   CBG: Recent Labs  Lab 08/21/19 1621 08/21/19 2132  GLUCAP 130* 100*   Lipid Profile: No results for input(s): CHOL, HDL, LDLCALC, TRIG, CHOLHDL, LDLDIRECT in the last 72 hours. Thyroid Function Tests: No results for  input(s): TSH, T4TOTAL, FREET4, T3FREE, THYROIDAB in the last 72 hours. Anemia Panel: No results for input(s): VITAMINB12, FOLATE, FERRITIN, TIBC, IRON, RETICCTPCT in the last 72 hours. Sepsis Labs: No results for input(s): PROCALCITON, LATICACIDVEN in the last 168 hours.  Recent Results (from the past 240 hour(s))  Urine Culture     Status: Abnormal   Collection Time: 08/18/19 10:53 AM   Specimen: Urine  Result Value Ref Range Status   MICRO NUMBER: [id number]  Final   SPECIMEN QUALITY: Adequate  Final   Sample Source URINE  Final   STATUS: FINAL  Final   ISOLATE 1: Escherichia coli (A)  Final    Comment: Greater than 100,000 CFU/mL of Escherichia coli      Susceptibility   Escherichia coli - URINE CULTURE, REFLEX    AMOX/CLAVULANIC <=2 Sensitive     AMPICILLIN 4  Sensitive     AMPICILLIN/SULBACTAM <=2 Sensitive     CEFAZOLIN* <=4 Not Reportable      * For infections other than uncomplicated UTIcaused by E. coli, K. pneumoniae or P. mirabilis:Cefazolin is resistant if MIC > or = 8 mcg/mL.(Distinguishing susceptible versus intermediatefor isolates with MIC < or = 4 mcg/mL requiresadditional testing.)For uncomplicated UTI caused by E. coli,K. pneumoniae or P. mirabilis: Cefazolin issusceptible if MIC <32 mcg/mL and predictssusceptible to the oral agents cefaclor, cefdinir,cefpodoxime, cefprozil, cefuroxime, cephalexinand loracarbef.    CEFEPIME <=1 Sensitive     CEFTRIAXONE <=1 Sensitive     CIPROFLOXACIN <=0.25 Sensitive     LEVOFLOXACIN <=0.12 Sensitive     ERTAPENEM <=0.5 Sensitive     GENTAMICIN <=1 Sensitive     IMIPENEM <=0.25 Sensitive     NITROFURANTOIN <=16 Sensitive     PIP/TAZO <=4 Sensitive     TOBRAMYCIN <=1 Sensitive     TRIMETH/SULFA* <=20 Sensitive      * For infections other than uncomplicated UTIcaused by E. coli, K. pneumoniae or P. mirabilis:Cefazolin is resistant if MIC > or = 8 mcg/mL.(Distinguishing susceptible versus intermediatefor isolates with MIC < or = 4 mcg/mL requiresadditional testing.)For uncomplicated UTI caused by E. coli,K. pneumoniae or P. mirabilis: Cefazolin issusceptible if MIC <32 mcg/mL and predictssusceptible to the oral agents cefaclor, cefdinir,cefpodoxime, cefprozil, cefuroxime, cephalexinand loracarbef.Legend:S = Susceptible  I = IntermediateR = Resistant  NS = Not susceptible* = Not tested  NR = Not reported**NN = See antimicrobic comments  Respiratory Panel by RT PCR (Flu A&B, Covid) - Nasopharyngeal Swab     Status: None   Collection Time: 08/21/19  7:59 AM   Specimen: Nasopharyngeal Swab  Result Value Ref Range Status   SARS Coronavirus 2 by RT PCR NEGATIVE NEGATIVE Final    Comment: (NOTE) SARS-CoV-2 target nucleic acids are NOT DETECTED. The SARS-CoV-2 RNA is generally detectable in upper  respiratoy specimens during the acute phase of infection. The lowest concentration of SARS-CoV-2 viral copies this assay can detect is 131 copies/mL. A negative result does not preclude SARS-Cov-2 infection and should not be used as the sole basis for treatment or other patient management decisions. A negative result may occur with  improper specimen collection/handling, submission of specimen other than nasopharyngeal swab, presence of viral mutation(s) within the areas targeted by this assay, and inadequate number of viral copies (<131 copies/mL). A negative result must be combined with clinical observations, patient history, and epidemiological information. The expected result is Negative. Fact Sheet for Patients:  https://www.moore.com/ Fact Sheet for Healthcare Providers:  https://www.young.biz/ This test is not yet ap proved or cleared by the  [place] [employer] and  has been authorized for detection and/or diagnosis of SARS-CoV-2 by FDA under an TEFL teacher (EUA). This EUA will remain  in effect (meaning this test can be used) for the duration of the COVID-19 declaration under Section 564(b)(1) of the Act, 21 U.S.C. section 360bbb-3(b)(1), unless the authorization is terminated or revoked sooner.    Influenza A by PCR NEGATIVE NEGATIVE Final   Influenza B by PCR NEGATIVE NEGATIVE Final    Comment: (NOTE) The Xpert Xpress SARS-CoV-2/FLU/RSV assay is intended as an aid in  the diagnosis of influenza from Nasopharyngeal swab specimens and  should not be used as a sole basis for treatment. Nasal washings and  aspirates are unacceptable for Xpert Xpress SARS-CoV-2/FLU/RSV  testing. Fact Sheet for Patients: https://www.moore.com/ Fact Sheet for Healthcare Providers: https://www.young.biz/ This test is not yet approved or cleared by the [place] FDA and  has been authorized for  detection and/or diagnosis of SARS-CoV-2 by  FDA under an Emergency Use Authorization (EUA). This EUA will remain  in effect (meaning this test can be used) for the duration of the  Covid-19 declaration under Section 564(b)(1) of the Act, 21  U.S.C. section 360bbb-3(b)(1), unless the authorization is  terminated or revoked. Performed at [facility], [address], [place], [place] [address]          Radiology Studies: CT ABDOMEN PELVIS W CONTRAST  Result Date: 08/21/2019 CLINICAL DATA:  Initial evaluation for acute mid abdominal pain. EXAM: CT ABDOMEN AND PELVIS WITH CONTRAST TECHNIQUE: Multidetector CT imaging of the abdomen and pelvis was performed using the standard protocol following bolus administration of intravenous contrast. CONTRAST:  OMNIPAQUE IOHEXOL 300 MG/ML  SOLN COMPARISON:  Prior CT from 08/20/2018. FINDINGS: Lower chest: Visualized lung bases are clear. Hepatobiliary: Diffuse hypoattenuation of the liver consistent with steatosis. Gallbladder within normal limits. No biliary dilatation. Pancreas: Diffuse fatty infiltration the pancreas noted. Pancreas otherwise unremarkable. Spleen: Spleen within normal limits. Adrenals/Urinary Tract: Adrenal glands are normal. Kidneys equal in size with symmetric enhancement. 15 mm simple exophytic cyst extends from the posterior right kidney. No focal enhancing renal mass. Multiple scattered bilateral nonobstructive nephrolithiasis measuring up to 4-5 mm bilaterally. No ureterolithiasis or evidence for obstructive uropathy. Partially distended bladder within normal limits. Stomach/Bowel: Stomach partially distended without acute abnormality. There is a complex ventral hernia containing fat and several loops of both small and large bowel. Several dilated loops of small bowel with internal air-fluid level seen proximal to the hernia, consistent with associated obstruction. Additionally, mucosal enhancement with inflammatory stranding  and small volume free fluid seen about 1 of the herniated loops of bowel in the right paramedian abdomen, concerning for possible incarceration or strangulation (series 3, image 86). No pneumatosis or portal venous gas. The herniated colon is noninflamed. Scattered colonic diverticulosis without evidence for acute diverticulitis. Normal appendix. Vascular/Lymphatic: Normal intravascular enhancement seen throughout the intra-abdominal aorta. No aneurysm. Mesenteric vessels patent proximally. No adenopathy. Reproductive: Prostate normal. Other: No free intraperitoneal air. Small volume free fluid within the hernia sac as above. No other free fluid within the abdomen and pelvis. Musculoskeletal: No acute osseous abnormality. No discrete or worrisome osseous lesions. Multilevel degenerative spondylosis noted throughout the visualized thoracolumbar spine. IMPRESSION: 1. Complex ventral hernia containing several loops of small and large bowel with associated small bowel obstruction. Mucosal enhancement with inflammatory stranding and small volume free fluid about a portion of the herniated small bowel concerning for possible incarceration and/or strangulation. Surgical consultation  recommended. 2. Colonic diverticulosis without evidence for acute diverticulitis. 3. Bilateral nonobstructive nephrolithiasis. 4. Hepatic steatosis. Electronically Signed   By: Rise Mu M.D.   On: 08/21/2019 06:27   DG Abd Portable 1V-Small Bowel Obstruction Protocol-initial, 8 hr delay  Result Date: 08/21/2019 CLINICAL DATA:  Small-bowel obstruction, 8 hour delay image EXAM: PORTABLE ABDOMEN - 1 VIEW COMPARISON:  08/21/2019 at 7:07 a.m. FINDINGS: 2 supine frontal views of the abdomen and pelvis excludes the right flank and hemidiaphragms by collimation. Enteric catheter unchanged. Dilute oral contrast is seen throughout the colon on this study. Mild distension of small bowel persists. No masses or abnormal calcifications.  IMPRESSION: 1. Progression of oral contrast into the colon. No evidence of small-bowel obstruction. 2. Stable nonspecific distension of the small bowel. Electronically Signed   By: Sharlet Salina M.D.   On: 08/21/2019 18:50   DG Abd Portable 1 View  Result Date: 08/21/2019 CLINICAL DATA:  NG tube placed EXAM: PORTABLE ABDOMEN - 1 VIEW COMPARISON:  08/21/2018 FINDINGS: Nasogastric tube with the tip projecting over the stomach. There is no bowel dilatation to suggest obstruction. There is no evidence of pneumoperitoneum, portal venous gas or pneumatosis. There are no pathologic calcifications along the expected course of the ureters. The osseous structures are unremarkable. IMPRESSION: Nasogastric tube with the tip projecting over the stomach. Electronically Signed   By: Elige Ko   On: 08/21/2019 07:47     Scheduled Meds: . Chlorhexidine Gluconate Cloth  6 each Topical Daily  . insulin aspart  0-15 Units Subcutaneous TID WC  . insulin aspart  0-5 Units Subcutaneous QHS   Continuous Infusions: . sodium chloride 125 mL/hr at 08/22/19 0524  . cefTRIAXone (ROCEPHIN)  IV 1 g (08/22/19 0525)     LOS: 1 day   Time spent:  Azucena Fallen, DO [facility]  If 7PM-7AM, please contact night-coverage [email or web address]  08/22/2019, 7:06 AM

## 2019-08-22 NOTE — Plan of Care (Signed)

## 2019-08-22 NOTE — Progress Notes (Signed)
Patient ID: Evan Leonard, male   DOB: [birth year], 59 y.o.   MRN: [id number]       Subjective: Continues to feel well.  Passing flatus.  Still with some intermittent nausea, despite NGT, but very minimal.    ROS: See above, otherwise other systems negative  Objective: Vital signs in last 24 hours: Temp:  [97.8 F (36.6 C)-98.6 F (37 C)] 98.6 F (37 C) (03/28 0758) Pulse Rate:  [76-85] 77 (03/28 0758) Resp:  [16-20] 16 (03/28 0758) BP: (115-143)/(67-93) 136/78 (03/28 0758) SpO2:  [91 %-100 %] 100 % (03/27 2237) Weight:  [159.2 kg] 159.2 kg (03/27 1942) Last BM Date: 08/20/19  Intake/Output from previous day: 03/27 0701 - 03/28 0700 In: 955.6 [I.V.:955.6] Out: 1400 [Emesis/NG output:1400] Intake/Output this shift: Total I/O In: -  Out: 475 [Urine:475]  PE: Gen: NAD Heart: regular Lungs: CTAB Abd: soft, NT, obese, +BS, NGt with some bilious output present  Lab Results:  Recent Labs    08/20/19 2326 08/22/19 0102  WBC 9.2 7.7  HGB 14.6 13.4  HCT 45.1 41.2  PLT 240 209   BMET Recent Labs    08/20/19 2326 08/22/19 0102  NA 138 141  K 3.7 3.6  CL 102 108  CO2 21* 25  GLUCOSE 191* 127*  BUN 19 15  CREATININE 1.16 0.95  CALCIUM 9.1 8.7*   PT/INR No results for input(s): LABPROT, INR in the last 72 hours. CMP     Component Value Date/Time   NA 141 08/22/2019 0102   K 3.6 08/22/2019 0102   CL 108 08/22/2019 0102   CO2 25 08/22/2019 0102   GLUCOSE 127 (H) 08/22/2019 0102   BUN 15 08/22/2019 0102   CREATININE 0.95 08/22/2019 0102   CALCIUM 8.7 (L) 08/22/2019 0102   PROT 7.6 08/20/2019 2326   ALBUMIN 3.7 08/20/2019 2326   AST 18 08/20/2019 2326   ALT 20 08/20/2019 2326   ALKPHOS 63 08/20/2019 2326   BILITOT 1.1 08/20/2019 2326   GFRNONAA >60 08/22/2019 0102   GFRAA >60 08/22/2019 0102   Lipase     Component Value Date/Time   LIPASE 19 08/20/2019 2326       Studies/Results: CT ABDOMEN PELVIS W CONTRAST  Result Date: 08/21/2019 CLINICAL  DATA:  Initial evaluation for acute mid abdominal pain. EXAM: CT ABDOMEN AND PELVIS WITH CONTRAST TECHNIQUE: Multidetector CT imaging of the abdomen and pelvis was performed using the standard protocol following bolus administration of intravenous contrast. CONTRAST:  170mL OMNIPAQUE IOHEXOL 300 MG/ML  SOLN COMPARISON:  Prior CT from 08/20/2018. FINDINGS: Lower chest: Visualized lung bases are clear. Hepatobiliary: Diffuse hypoattenuation of the liver consistent with steatosis. Gallbladder within normal limits. No biliary dilatation. Pancreas: Diffuse fatty infiltration the pancreas noted. Pancreas otherwise unremarkable. Spleen: Spleen within normal limits. Adrenals/Urinary Tract: Adrenal glands are normal. Kidneys equal in size with symmetric enhancement. 15 mm simple exophytic cyst extends from the posterior right kidney. No focal enhancing renal mass. Multiple scattered bilateral nonobstructive nephrolithiasis measuring up to 4-5 mm bilaterally. No ureterolithiasis or evidence for obstructive uropathy. Partially distended bladder within normal limits. Stomach/Bowel: Stomach partially distended without acute abnormality. There is a complex ventral hernia containing fat and several loops of both small and large bowel. Several dilated loops of small bowel with internal air-fluid level seen proximal to the hernia, consistent with associated obstruction. Additionally, mucosal enhancement with inflammatory stranding and small volume free fluid seen about 1 of the herniated loops of bowel in the right paramedian abdomen, concerning for  possible incarceration or strangulation (series 3, image 80). No pneumatosis or portal venous gas. The herniated colon is noninflamed. Scattered colonic diverticulosis without evidence for acute diverticulitis. Normal appendix. Vascular/Lymphatic: Normal intravascular enhancement seen throughout the intra-abdominal aorta. No aneurysm. Mesenteric vessels patent proximally. No adenopathy.  Reproductive: Prostate normal. Other: No free intraperitoneal air. Small volume free fluid within the hernia sac as above. No other free fluid within the abdomen and pelvis. Musculoskeletal: No acute osseous abnormality. No discrete or worrisome osseous lesions. Multilevel degenerative spondylosis noted throughout the visualized thoracolumbar spine. IMPRESSION: 1. Complex ventral hernia containing several loops of small and large bowel with associated small bowel obstruction. Mucosal enhancement with inflammatory stranding and small volume free fluid about a portion of the herniated small bowel concerning for possible incarceration and/or strangulation. Surgical consultation recommended. 2. Colonic diverticulosis without evidence for acute diverticulitis. 3. Bilateral nonobstructive nephrolithiasis. 4. Hepatic steatosis. Electronically Signed   By: Jeannine Boga M.D.   On: 08/21/2019 06:27   DG Abd Portable 1V-Small Bowel Obstruction Protocol-initial, 8 hr delay  Result Date: 08/21/2019 CLINICAL DATA:  Small-bowel obstruction, 8 hour delay image EXAM: PORTABLE ABDOMEN - 1 VIEW COMPARISON:  08/21/2019 at 7:07 a.m. FINDINGS: 2 supine frontal views of the abdomen and pelvis excludes the right flank and hemidiaphragms by collimation. Enteric catheter unchanged. Dilute oral contrast is seen throughout the colon on this study. Mild distension of small bowel persists. No masses or abnormal calcifications. IMPRESSION: 1. Progression of oral contrast into the colon. No evidence of small-bowel obstruction. 2. Stable nonspecific distension of the small bowel. Electronically Signed   By: Randa Ngo M.D.   On: 08/21/2019 18:50   DG Abd Portable 1 View  Result Date: 08/21/2019 CLINICAL DATA:  NG tube placed EXAM: PORTABLE ABDOMEN - 1 VIEW COMPARISON:  08/21/2018 FINDINGS: Nasogastric tube with the tip projecting over the stomach. There is no bowel dilatation to suggest obstruction. There is no evidence of  pneumoperitoneum, portal venous gas or pneumatosis. There are no pathologic calcifications along the expected course of the ureters. The osseous structures are unremarkable. IMPRESSION: Nasogastric tube with the tip projecting over the stomach. Electronically Signed   By: Kathreen Devoid   On: 08/21/2019 07:47    Anti-infectives: Anti-infectives (From admission, onward)   Start     Dose/Rate Route Frequency Ordered Stop   08/22/19 0500  cefTRIAXone (ROCEPHIN) 1 g in sodium chloride 0.9 % 100 mL IVPB     1 g 200 mL/hr over 30 Minutes Intravenous Every 24 hours 08/21/19 0954     08/21/19 0445  cefTRIAXone (ROCEPHIN) 1 g in sodium chloride 0.9 % 100 mL IVPB     1 g 200 mL/hr over 30 Minutes Intravenous  Once 08/21/19 0442 08/21/19 0540       Assessment/Plan DM HTN Morbid obesity History of DVT/PE -on Eliquis at home.  This will need to be held in case he needs any type of operative intervention.  Also would consider discussion if Eliquis is truly needed 5 years after a history of only 1 DVT.  Will defer to medicine. OSA Recurrent UTIs -currently with E. coli UTI.  Will defer to medical team for further treatment of this.  Complex incisional ventral hernia with possible SBO -follow up abd film today shows contrast in his colon -Dc NGT and give CLD -if patient tolerates he can discharge and adv his diet at home vs stay and adv to soft tomorrow and discharge after that.  Either is fine.  FEN -CLD VTE -resume eliquis if indicated, unclear that he needs this, see above ID -defer to medical team for UTI, but does not need for his hernia   LOS: 1 day    Henreitta Cea , [facility] 08/22/2019, 9:49 AM Please see Amion for pager number during day hours 7:00am-4:30pm or 7:00am -11:30am on weekends

## 2019-08-23 LAB — GLUCOSE, CAPILLARY: Glucose-Capillary: 149 mg/dL — ABNORMAL HIGH (ref 70–99)

## 2019-08-23 MED ORDER — PROMETHAZINE HCL 25 MG/ML IJ SOLN: 12.5000 mg | Freq: Four times a day (QID) | INTRAMUSCULAR | Status: DC | PRN

## 2019-08-23 NOTE — Progress Notes (Signed)
Please inform patient of the following:  Urine culture confirms UTI. The antibiotic he is on should treat this. Recommend he finish full course and let us know if not improving.  Evan Leonard. Jerline Pain, MD 08/23/2019 8:56 AM

## 2019-08-23 NOTE — Progress Notes (Signed)
Subjective: CC: Patient reports that he is tolerating FLD. No emesis. He has minimal pain to the right of his umbilicus with palpation. Passing flatus and has had 2 BM's in the last 24 hours.   Objective: Vital signs in last 24 hours: Temp:  [98.6 F (37 C)-99.1 F (37.3 C)] 98.6 F (37 C) (03/28 2216) Pulse Rate:  [79-83] 83 (03/28 2216) Resp:  [16] 16 (03/28 2216) BP: (125-138)/(70-77) 125/70 (03/28 2216) SpO2:  [95 %-96 %] 96 % (03/28 2216) Last BM Date: 08/20/19  Intake/Output from previous day: 03/28 0701 - 03/29 0700 In: 1123.6 [P.O.:240; I.V.:783.6; IV Piggyback:100] Out: 725 [Urine:725] Intake/Output this shift: Total I/O In: -  Out: 300 [Urine:300]  PE: Gen: Awake and alert, NAD Lungs: Normal rate and effort Abd: Soft, ND, minimal tenderness to the right of umbilicus without r/r/g. +BS  Lab Results:  Recent Labs    08/20/19 2326 08/22/19 0102  WBC 9.2 7.7  HGB 14.6 13.4  HCT 45.1 41.2  PLT 240 209   BMET Recent Labs    08/20/19 2326 08/22/19 0102  NA 138 141  K 3.7 3.6  CL 102 108  CO2 21* 25  GLUCOSE 191* 127*  BUN 19 15  CREATININE 1.16 0.95  CALCIUM 9.1 8.7*   PT/INR No results for input(s): LABPROT, INR in the last 72 hours. CMP     Component Value Date/Time   NA 141 08/22/2019 0102   K 3.6 08/22/2019 0102   CL 108 08/22/2019 0102   CO2 25 08/22/2019 0102   GLUCOSE 127 (H) 08/22/2019 0102   BUN 15 08/22/2019 0102   CREATININE 0.95 08/22/2019 0102   CALCIUM 8.7 (L) 08/22/2019 0102   PROT 7.6 08/20/2019 2326   ALBUMIN 3.7 08/20/2019 2326   AST 18 08/20/2019 2326   ALT 20 08/20/2019 2326   ALKPHOS 63 08/20/2019 2326   BILITOT 1.1 08/20/2019 2326   GFRNONAA >60 08/22/2019 0102   GFRAA >60 08/22/2019 0102   Lipase     Component Value Date/Time   LIPASE 19 08/20/2019 2326       Studies/Results: DG Abd Portable 1V-Small Bowel Obstruction Protocol-initial, 8 hr delay  Result Date: 08/21/2019 CLINICAL DATA:   Small-bowel obstruction, 8 hour delay image EXAM: PORTABLE ABDOMEN - 1 VIEW COMPARISON:  08/21/2019 at 7:07 a.m. FINDINGS: 2 supine frontal views of the abdomen and pelvis excludes the right flank and hemidiaphragms by collimation. Enteric catheter unchanged. Dilute oral contrast is seen throughout the colon on this study. Mild distension of small bowel persists. No masses or abnormal calcifications. IMPRESSION: 1. Progression of oral contrast into the colon. No evidence of small-bowel obstruction. 2. Stable nonspecific distension of the small bowel. Electronically Signed   By: Randa Ngo M.D.   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 Okay to restart at d/c.Would consider discussion if Eliquis is truly  needed 5 years after a history of only 1 DVT. Defer to medicine. OSA Recurrent UTIs- currently with E. coli UTI. Will defer to medical team for further treatment of this.  Complex incisional ventral hernia with possible SBO - Contrast in colon on plain films 3/27 - Tolerating FLD - Okay for d/c. Adv to soft. He can discharge this AM and adv his diet at home vs stay and have soft diet tray here.  Either is fine.  FEN - Soft VTE - Can resume eliquis if indicated at time of d/c, unclear that he needs this, see above ID - Defer to medical team for UTI, but does not need for his hernia   LOS: 2 days    Jillyn Ledger , [facility] 08/23/2019, 10:00 AM Please see Amion for  pager number during day hours 7:00am-4:30pm

## 2019-08-23 NOTE — Discharge Summary (Signed)
Physician Discharge Summary  Torion Shaak [id number] DOB: 1961-04-05 DOA: 08/20/2019  PCP: [name], MD  Admit date: 08/20/2019 Discharge date: 08/23/2019  Admitted From: Home Disposition: Home  Recommendations for Outpatient Follow-up:  1. Follow up with PCP in 1-2 weeks 2. Please obtain BMP/CBC in one week  Discharge Condition: Stable CODE STATUS: Full Diet recommendation: Diabetic, low calorie, low-fat diet as tolerated  Brief/Interim Summary: Lara Mulch a 59 y.o.malewith medical history significant ofSBO (2017); PE (2013, on Eliquis); PVD; OSA on CPAP; morbid obesity (BMI 45); HTN; h/o incarcerated hernia; fatty liver; and DM with h/o R foot ulcer presenting with recent diagnosis of UTI and current abdominal pain with nausea.He reports that he has had a UTI but also abdominal pain across his stomach. He went to his PCP office and was given antibiotics. It got worse last night at work with severe nausea, ?dehydration. Nothing seemed to make it worse. The nausea improved with medication. He had hernia surgery several years ago. Last BM was yesterday am and normal. +flatus. His urine was cloudy with a strong odor and got darker. He was having small voids but no dysuria. He reports 1 prior episode of PE and US showed a DVT. He was initially on [facility] but had transitioned to 81 mg ASA daily. During his last hospitalization for SBO, he was started back on Eliquis. He does not think he had a clot at that time and is uncertain why the Eliquis was started.  Patient admitted as above with likely acute UTI with failure of outpatient medication and intractable abdominal pain concerning for SBO given patient's remarkable history.  Fortunately patient had unremarkable imaging with follow-through, now tolerating p.o. adequately with notable bowel movement and flatus over the past 24 hours and otherwise cleared for discharge by surgery.  Patient's UTI appears to have resolved on  current antibiotic regimen, patient has now completed antibiotic course.  Patient need close follow-up with PCP to ensure resolution of UTI, as well as for further monitoring of chronic comorbid conditions as above.  Of note patient had previously been resumed on his Eliquis for unclear reasons at previous hospitalization.  He does have a history of PE in 2013 with acute asymptomatic DVT in 2017.  It is certainly worth discussion whether or not to maintain patient on anticoagulation given his recurrent unprovoked DVT, but it is unclear why the patient was previously stopped and then subsequently resumed on full dose anticoagulation. Patient denies any known bleeding episodes or issues tolerating anticoagulation, will defer to PCP in the outpatient setting for further discussion about risks versus benefits of chronic anticoagulation.  Patient otherwise stable and agreeable for discharge as above.  Advance diet as tolerated, continue to recommend low-salt cardiac and diabetic diet given patient's comorbid conditions.    Discharge Diagnoses:  Principal Problem:   SBO (small bowel obstruction) (HCC) Active Problems:   Morbid obesity with body mass index of 40.0-44.9 in adult [facility])   Obstructive sleep apnea   Diabetes mellitus with hyperglycemia [facility])    Discharge Instructions  Discharge Instructions    Call MD for:  difficulty breathing, headache or visual disturbances   Complete by: As directed    Call MD for:  extreme fatigue   Complete by: As directed    Call MD for:  hives   Complete by: As directed    Call MD for:  persistant dizziness or light-headedness   Complete by: As directed    Call MD for:  persistant nausea and  vomiting   Complete by: As directed    Call MD for:  severe uncontrolled pain   Complete by: As directed    Call MD for:  temperature >100.4   Complete by: As directed    Diet - low sodium heart healthy   Complete by: As directed    Increase activity slowly    Complete by: As directed      Allergies as of 08/23/2019   No Known Allergies     Medication List    STOP taking these medications   nitrofurantoin (macrocrystal-monohydrate) 100 MG capsule Commonly known as: Macrobid     TAKE these medications   Eliquis 5 MG Tabs tablet Generic drug: apixaban TAKE 1 TABLET BY MOUTH TWICE A DAY   furosemide 40 MG tablet Commonly known as: Lasix Take 1 tablet (40 mg total) by mouth daily as needed for fluid or edema.   ibuprofen 200 MG tablet Commonly known as: ADVIL Take 400 mg by mouth every 6 (six) hours as needed for moderate pain.   metFORMIN 500 MG tablet Commonly known as: GLUCOPHAGE Take 2 tablets (1,000 mg total) by mouth 2 (two) times daily with a meal.   multivitamin with minerals Tabs tablet Take 1 tablet by mouth daily.       No Known Allergies  Consultations: [facility] (general surgery)  Procedures/Studies: CT ABDOMEN PELVIS W CONTRAST  Result Date: 08/21/2019 CLINICAL DATA:  Initial evaluation for acute mid abdominal pain. EXAM: CT ABDOMEN AND PELVIS WITH CONTRAST TECHNIQUE: Multidetector CT imaging of the abdomen and pelvis was performed using the standard protocol following bolus administration of intravenous contrast. CONTRAST:  117mL OMNIPAQUE IOHEXOL 300 MG/ML  SOLN COMPARISON:  Prior CT from 08/20/2018. FINDINGS: Lower chest: Visualized lung bases are clear. Hepatobiliary: Diffuse hypoattenuation of the liver consistent with steatosis. Gallbladder within normal limits. No biliary dilatation. Pancreas: Diffuse fatty infiltration the pancreas noted. Pancreas otherwise unremarkable. Spleen: Spleen within normal limits. Adrenals/Urinary Tract: Adrenal glands are normal. Kidneys equal in size with symmetric enhancement. 15 mm simple exophytic cyst extends from the posterior right kidney. No focal enhancing renal mass. Multiple scattered bilateral nonobstructive nephrolithiasis measuring up to 4-5 mm  bilaterally. No ureterolithiasis or evidence for obstructive uropathy. Partially distended bladder within normal limits. Stomach/Bowel: Stomach partially distended without acute abnormality. There is a complex ventral hernia containing fat and several loops of both small and large bowel. Several dilated loops of small bowel with internal air-fluid level seen proximal to the hernia, consistent with associated obstruction. Additionally, mucosal enhancement with inflammatory stranding and small volume free fluid seen about 1 of the herniated loops of bowel in the right paramedian abdomen, concerning for possible incarceration or strangulation (series 3, image 86). No pneumatosis or portal venous gas. The herniated colon is noninflamed. Scattered colonic diverticulosis without evidence for acute diverticulitis. Normal appendix. Vascular/Lymphatic: Normal intravascular enhancement seen throughout the intra-abdominal aorta. No aneurysm. Mesenteric vessels patent proximally. No adenopathy. Reproductive: Prostate normal. Other: No free intraperitoneal air. Small volume free fluid within the hernia sac as above. No other free fluid within the abdomen and pelvis. Musculoskeletal: No acute osseous abnormality. No discrete or worrisome osseous lesions. Multilevel degenerative spondylosis noted throughout the visualized thoracolumbar spine. IMPRESSION: 1. Complex ventral hernia containing several loops of small and large bowel with associated small bowel obstruction. Mucosal enhancement with inflammatory stranding and small volume free fluid about a portion of the herniated small bowel concerning for possible incarceration and/or strangulation. Surgical consultation recommended. 2. Colonic diverticulosis  without evidence for acute diverticulitis. 3. Bilateral nonobstructive nephrolithiasis. 4. Hepatic steatosis. Electronically Signed   By: Jeannine Boga M.D.   On: 08/21/2019 06:27   DG Abd Portable 1V-Small Bowel  Obstruction Protocol-initial, 8 hr delay  Result Date: 08/21/2019 CLINICAL DATA:  Small-bowel obstruction, 8 hour delay image EXAM: PORTABLE ABDOMEN - 1 VIEW COMPARISON:  08/21/2019 at 7:07 a.m. FINDINGS: 2 supine frontal views of the abdomen and pelvis excludes the right flank and hemidiaphragms by collimation. Enteric catheter unchanged. Dilute oral contrast is seen throughout the colon on this study. Mild distension of small bowel persists. No masses or abnormal calcifications. IMPRESSION: 1. Progression of oral contrast into the colon. No evidence of small-bowel obstruction. 2. Stable nonspecific distension of the small bowel. Electronically Signed   By: Randa Ngo M.D.   On: 08/21/2019 18:50   DG Abd Portable 1 View  Result Date: 08/21/2019 CLINICAL DATA:  NG tube placed EXAM: PORTABLE ABDOMEN - 1 VIEW COMPARISON:  08/21/2018 FINDINGS: Nasogastric tube with the tip projecting over the stomach. There is no bowel dilatation to suggest obstruction. There is no evidence of pneumoperitoneum, portal venous gas or pneumatosis. There are no pathologic calcifications along the expected course of the ureters. The osseous structures are unremarkable. IMPRESSION: Nasogastric tube with the tip projecting over the stomach. Electronically Signed   By: Kathreen Devoid   On: 08/21/2019 07:47    Subjective: Issues or events overnight, patient does indicate ongoing bowel movement and flatus, bowel movement was somewhat soft but denies chest pain, shortness of breath, nausea, vomiting, diarrhea, constipation, headache, fevers, chills.   Discharge Exam: Vitals:   08/22/19 1732 08/22/19 2216  BP: 138/77 125/70  Pulse: 79 83  Resp: 16 16  Temp: 99.1 F (37.3 C) 98.6 F (37 C)  SpO2: 95% 96%   Vitals:   08/21/19 2237 08/22/19 0758 08/22/19 1732 08/22/19 2216  BP: (!) 143/80 136/78 138/77 125/70  Pulse: 76 77 79 83  Resp: 18 16 16 16   Temp: 97.8 F (36.6 C) 98.6 F (37 C) 99.1 F (37.3 C) 98.6 F (37 C)   TempSrc: Oral   Oral  SpO2: 100%  95% 96%  Weight:      Height:        General:  Pleasantly resting in bed, No acute distress. HEENT:  Normocephalic atraumatic.  Sclerae nonicteric, noninjected.  Extraocular movements intact bilaterally. Neck:  Without mass or deformity.  Trachea is midline. Lungs:  Clear to auscultate bilaterally without rhonchi, wheeze, or rales. Heart:  Regular rate and rhythm.  Without murmurs, rubs, or gallops. Abdomen:  Soft, nontender, nondistended.  Without guarding or rebound. Extremities: Without cyanosis, clubbing, edema, or obvious deformity. Vascular:  Dorsalis pedis and posterior tibial pulses palpable bilaterally. Skin:  Warm and dry, no erythema, no ulcerations.   The results of significant diagnostics from this hospitalization (including imaging, microbiology, ancillary and laboratory) are listed below for reference.     Microbiology: Recent Results (from the past 240 hour(s))  Urine Culture     Status: Abnormal   Collection Time: 08/18/19 10:53 AM   Specimen: Urine  Result Value Ref Range Status   MICRO NUMBER: [id number]  Final   SPECIMEN QUALITY: Adequate  Final   Sample Source URINE  Final   STATUS: FINAL  Final   ISOLATE 1: Escherichia coli (A)  Final    Comment: Greater than 100,000 CFU/mL of Escherichia coli      Susceptibility   Escherichia coli - URINE CULTURE, REFLEX  AMOX/CLAVULANIC <=2 Sensitive     AMPICILLIN 4 Sensitive     AMPICILLIN/SULBACTAM <=2 Sensitive     CEFAZOLIN* <=4 Not Reportable      * For infections other than uncomplicated UTIcaused by E. coli, K. pneumoniae or P. mirabilis:Cefazolin is resistant if MIC > or = 8 mcg/mL.(Distinguishing susceptible versus intermediatefor isolates with MIC < or = 4 mcg/mL requiresadditional testing.)For uncomplicated UTI caused by E. coli,K. pneumoniae or P. mirabilis: Cefazolin issusceptible if MIC <32 mcg/mL and predictssusceptible to the oral agents cefaclor, cefdinir,cefpodoxime,  cefprozil, cefuroxime, cephalexinand loracarbef.    CEFEPIME <=1 Sensitive     CEFTRIAXONE <=1 Sensitive     CIPROFLOXACIN <=0.25 Sensitive     LEVOFLOXACIN <=0.12 Sensitive     ERTAPENEM <=0.5 Sensitive     GENTAMICIN <=1 Sensitive     IMIPENEM <=0.25 Sensitive     NITROFURANTOIN <=16 Sensitive     PIP/TAZO <=4 Sensitive     TOBRAMYCIN <=1 Sensitive     TRIMETH/SULFA* <=20 Sensitive      * For infections other than uncomplicated UTIcaused by E. coli, K. pneumoniae or P. mirabilis:Cefazolin is resistant if MIC > or = 8 mcg/mL.(Distinguishing susceptible versus intermediatefor isolates with MIC < or = 4 mcg/mL requiresadditional testing.)For uncomplicated UTI caused by E. coli,K. pneumoniae or P. mirabilis: Cefazolin issusceptible if MIC <32 mcg/mL and predictssusceptible to the oral agents cefaclor, cefdinir,cefpodoxime, cefprozil, cefuroxime, cephalexinand loracarbef.Legend:S = Susceptible  I = IntermediateR = Resistant  NS = Not susceptible* = Not tested  NR = Not reported**NN = See antimicrobic comments  Respiratory Panel by RT PCR (Flu A&B, Covid) - Nasopharyngeal Swab     Status: None   Collection Time: 08/21/19  7:59 AM   Specimen: Nasopharyngeal Swab  Result Value Ref Range Status   SARS Coronavirus 2 by RT PCR NEGATIVE NEGATIVE Final    Comment: (NOTE) SARS-CoV-2 target nucleic acids are NOT DETECTED. The SARS-CoV-2 RNA is generally detectable in upper respiratoy specimens during the acute phase of infection. The lowest concentration of SARS-CoV-2 viral copies this assay can detect is 131 copies/mL. A negative result does not preclude SARS-Cov-2 infection and should not be used as the sole basis for treatment or other patient management decisions. A negative result may occur with  improper specimen collection/handling, submission of specimen other than nasopharyngeal swab, presence of viral mutation(s) within the areas targeted by this assay, and inadequate number of viral  copies (<131 copies/mL). A negative result must be combined with clinical observations, patient history, and epidemiological information. The expected result is Negative. Fact Sheet for Patients:  PinkCheek.be Fact Sheet for Healthcare Providers:  GravelBags.it This test is not yet ap proved or cleared by the [place] FDA and  has been authorized for detection and/or diagnosis of SARS-CoV-2 by FDA under an Emergency Use Authorization (EUA). This EUA will remain  in effect (meaning this test can be used) for the duration of the COVID-19 declaration under Section 564(b)(1) of the Act, 21 U.S.C. section 360bbb-3(b)(1), unless the authorization is terminated or revoked sooner.    Influenza A by PCR NEGATIVE NEGATIVE Final   Influenza B by PCR NEGATIVE NEGATIVE Final    Comment: (NOTE) The Xpert Xpress SARS-CoV-2/FLU/RSV assay is intended as an aid in  the diagnosis of influenza from Nasopharyngeal swab specimens and  should not be used as a sole basis for treatment. Nasal washings and  aspirates are unacceptable for Xpert Xpress SARS-CoV-2/FLU/RSV  testing. Fact Sheet for Patients: PinkCheek.be Fact Sheet for Healthcare Providers:  GravelBags.it This test is not yet approved or cleared by the [place] and  has been authorized for detection and/or diagnosis of SARS-CoV-2 by  FDA under an Emergency Use Authorization (EUA). This EUA will remain  in effect (meaning this test can be used) for the duration of the  Covid-19 declaration under Section 564(b)(1) of the Act, 21  U.S.C. section 360bbb-3(b)(1), unless the authorization is  terminated or revoked. Performed at [facility], [place] [address], [place], [place] [address]      Labs: BNP (last 3 results) No results for input(s): BNP in the last 8760 hours. Basic Metabolic Panel: Recent Labs  Lab  08/20/19 2326 08/22/19 0102  NA 138 141  K 3.7 3.6  CL 102 108  CO2 21* 25  GLUCOSE 191* 127*  BUN 19 15  CREATININE 1.16 0.95  CALCIUM 9.1 8.7*   Liver Function Tests: Recent Labs  Lab 08/20/19 2326  AST 18  ALT 20  ALKPHOS 63  BILITOT 1.1  PROT 7.6  ALBUMIN 3.7   Recent Labs  Lab 08/20/19 2326  LIPASE 19   No results for input(s): AMMONIA in the last 168 hours. CBC: Recent Labs  Lab 08/20/19 2326 08/22/19 0102  WBC 9.2 7.7  HGB 14.6 13.4  HCT 45.1 41.2  MCV 90.4 91.4  PLT 240 209   Cardiac Enzymes: No results for input(s): CKTOTAL, CKMB, CKMBINDEX, TROPONINI in the last 168 hours. BNP: Invalid input(s): POCBNP CBG: Recent Labs  Lab 08/22/19 0800 08/22/19 1146 08/22/19 1730 08/22/19 2112 08/23/19 0900  GLUCAP 147* 135* 116* 115* 149*   D-Dimer No results for input(s): DDIMER in the last 72 hours. Hgb A1c Recent Labs    08/22/19 0102  HGBA1C 8.9*   Lipid Profile No results for input(s): CHOL, HDL, LDLCALC, TRIG, CHOLHDL, LDLDIRECT in the last 72 hours. Thyroid function studies No results for input(s): TSH, T4TOTAL, T3FREE, THYROIDAB in the last 72 hours.  Invalid input(s): FREET3 Anemia work up No results for input(s): VITAMINB12, FOLATE, FERRITIN, TIBC, IRON, RETICCTPCT in the last 72 hours. Urinalysis    Component Value Date/Time   COLORURINE AMBER (A) 08/20/2019 2313   APPEARANCEUR HAZY (A) 08/20/2019 2313   LABSPEC 1.018 08/20/2019 2313   PHURINE 5.0 08/20/2019 2313   GLUCOSEU NEGATIVE 08/20/2019 2313   HGBUR SMALL (A) 08/20/2019 2313   BILIRUBINUR NEGATIVE 08/20/2019 2313   BILIRUBINUR negative 08/18/2019 1045   KETONESUR NEGATIVE 08/20/2019 2313   PROTEINUR NEGATIVE 08/20/2019 2313   UROBILINOGEN 0.2 08/18/2019 1045   UROBILINOGEN 0.2 03/05/2018 1539   NITRITE POSITIVE (A) 08/20/2019 2313   LEUKOCYTESUR MODERATE (A) 08/20/2019 2313   Sepsis Labs Invalid input(s): PROCALCITONIN,  WBC,  LACTICIDVEN Microbiology Recent  Results (from the past 240 hour(s))  Urine Culture     Status: Abnormal   Collection Time: 08/18/19 10:53 AM   Specimen: Urine  Result Value Ref Range Status   MICRO NUMBER: [id number]  Final   SPECIMEN QUALITY: Adequate  Final   Sample Source URINE  Final   STATUS: FINAL  Final   ISOLATE 1: Escherichia coli (A)  Final    Comment: Greater than 100,000 CFU/mL of Escherichia coli      Susceptibility   Escherichia coli - URINE CULTURE, REFLEX    AMOX/CLAVULANIC <=2 Sensitive     AMPICILLIN 4 Sensitive     AMPICILLIN/SULBACTAM <=2 Sensitive     CEFAZOLIN* <=4 Not Reportable      * For infections other than uncomplicated UTIcaused  by E. coli, K. pneumoniae or P. mirabilis:Cefazolin is resistant if MIC > or = 8 mcg/mL.(Distinguishing susceptible versus intermediatefor isolates with MIC < or = 4 mcg/mL requiresadditional testing.)For uncomplicated UTI caused by E. coli,K. pneumoniae or P. mirabilis: Cefazolin issusceptible if MIC <32 mcg/mL and predictssusceptible to the oral agents cefaclor, cefdinir,cefpodoxime, cefprozil, cefuroxime, cephalexinand loracarbef.    CEFEPIME <=1 Sensitive     CEFTRIAXONE <=1 Sensitive     CIPROFLOXACIN <=0.25 Sensitive     LEVOFLOXACIN <=0.12 Sensitive     ERTAPENEM <=0.5 Sensitive     GENTAMICIN <=1 Sensitive     IMIPENEM <=0.25 Sensitive     NITROFURANTOIN <=16 Sensitive     PIP/TAZO <=4 Sensitive     TOBRAMYCIN <=1 Sensitive     TRIMETH/SULFA* <=20 Sensitive      * For infections other than uncomplicated UTIcaused by E. coli, K. pneumoniae or P. mirabilis:Cefazolin is resistant if MIC > or = 8 mcg/mL.(Distinguishing susceptible versus intermediatefor isolates with MIC < or = 4 mcg/mL requiresadditional testing.)For uncomplicated UTI caused by E. coli,K. pneumoniae or P. mirabilis: Cefazolin issusceptible if MIC <32 mcg/mL and predictssusceptible to the oral agents cefaclor, cefdinir,cefpodoxime, cefprozil, cefuroxime, cephalexinand loracarbef.Legend:S =  Susceptible  I = IntermediateR = Resistant  NS = Not susceptible* = Not tested  NR = Not reported**NN = See antimicrobic comments  Respiratory Panel by RT PCR (Flu A&B, Covid) - Nasopharyngeal Swab     Status: None   Collection Time: 08/21/19  7:59 AM   Specimen: Nasopharyngeal Swab  Result Value Ref Range Status   SARS Coronavirus 2 by RT PCR NEGATIVE NEGATIVE Final    Comment: (NOTE) SARS-CoV-2 target nucleic acids are NOT DETECTED. The SARS-CoV-2 RNA is generally detectable in upper respiratoy specimens during the acute phase of infection. The lowest concentration of SARS-CoV-2 viral copies this assay can detect is 131 copies/mL. A negative result does not preclude SARS-Cov-2 infection and should not be used as the sole basis for treatment or other patient management decisions. A negative result may occur with  improper specimen collection/handling, submission of specimen other than nasopharyngeal swab, presence of viral mutation(s) within the areas targeted by this assay, and inadequate number of viral copies (<131 copies/mL). A negative result must be combined with clinical observations, patient history, and epidemiological information. The expected result is Negative. Fact Sheet for Patients:  PinkCheek.be Fact Sheet for Healthcare Providers:  GravelBags.it This test is not yet ap proved or cleared by the [place] FDA and  has been authorized for detection and/or diagnosis of SARS-CoV-2 by FDA under an Emergency Use Authorization (EUA). This EUA will remain  in effect (meaning this test can be used) for the duration of the COVID-19 declaration under Section 564(b)(1) of the Act, 21 U.S.C. section 360bbb-3(b)(1), unless the authorization is terminated or revoked sooner.    Influenza A by PCR NEGATIVE NEGATIVE Final   Influenza B by PCR NEGATIVE NEGATIVE Final    Comment: (NOTE) The Xpert Xpress SARS-CoV-2/FLU/RSV  assay is intended as an aid in  the diagnosis of influenza from Nasopharyngeal swab specimens and  should not be used as a sole basis for treatment. Nasal washings and  aspirates are unacceptable for Xpert Xpress SARS-CoV-2/FLU/RSV  testing. Fact Sheet for Patients: PinkCheek.be Fact Sheet for Healthcare Providers: GravelBags.it This test is not yet approved or cleared by the [place] FDA and  has been authorized for detection and/or diagnosis of SARS-CoV-2 by  FDA under an Emergency Use Authorization (EUA). This EUA will  remain  in effect (meaning this test can be used) for the duration of the  Covid-19 declaration under Section 564(b)(1) of the Act, 21  U.S.C. section 360bbb-3(b)(1), unless the authorization is  terminated or revoked. Performed at [facility], [place] [address] [place], [place] [address]      Time coordinating discharge: Over 30 minutes  SIGNED:   Little Ishikawa, DO [facility] 08/23/2019, 10:23 AM Pager   If 7PM-7AM, please contact night-coverage [email or web address]

## 2019-08-24 ENCOUNTER — Telehealth: Payer: Self-pay

## 2019-08-24 NOTE — Telephone Encounter (Signed)
Called and left message for patient to return call to follow up from recent hospital stay

## 2019-10-01 ENCOUNTER — Other Ambulatory Visit: Payer: Self-pay

## 2019-10-01 MED ORDER — METFORMIN HCL 500 MG PO TABS: 1000.0000 mg | ORAL_TABLET | Freq: Two times a day (BID) | ORAL | 3 refills | Status: DC

## 2019-11-17 ENCOUNTER — Other Ambulatory Visit: Payer: Self-pay | Admitting: Family Medicine

## 2019-12-06 ENCOUNTER — Encounter (HOSPITAL_COMMUNITY): Payer: Self-pay | Admitting: Emergency Medicine

## 2019-12-06 ENCOUNTER — Ambulatory Visit (HOSPITAL_COMMUNITY)
Admission: EM | Admit: 2019-12-06 | Discharge: 2019-12-06 | Disposition: A | Discharge status: Home / Self Care | Payer: PRIVATE HEALTH INSURANCE | Attending: Family Medicine | Admitting: Family Medicine

## 2019-12-06 ENCOUNTER — Other Ambulatory Visit: Payer: Self-pay

## 2019-12-06 DIAGNOSIS — R739 Hyperglycemia, unspecified: Secondary | ICD-10-CM

## 2019-12-06 DIAGNOSIS — R5383 Other fatigue: Secondary | ICD-10-CM | POA: Insufficient documentation

## 2019-12-06 DIAGNOSIS — B372 Candidiasis of skin and nail: Secondary | ICD-10-CM | POA: Insufficient documentation

## 2019-12-06 LAB — CBC
HCT: 46 % (ref 39.0–52.0)
Hemoglobin: 15.5 g/dL (ref 13.0–17.0)
MCH: 30.2 pg (ref 26.0–34.0)
MCHC: 33.7 g/dL (ref 30.0–36.0)
MCV: 89.5 fL (ref 80.0–100.0)
Platelets: 180 10*3/uL (ref 150–400)
RBC: 5.14 MIL/uL (ref 4.22–5.81)
RDW: 13.6 % (ref 11.5–15.5)
WBC: 7.7 10*3/uL (ref 4.0–10.5)
nRBC: 0 % (ref 0.0–0.2)

## 2019-12-06 LAB — COMPREHENSIVE METABOLIC PANEL
ALT: 39 U/L (ref 0–44)
AST: 28 U/L (ref 15–41)
Albumin: 3.6 g/dL (ref 3.5–5.0)
Alkaline Phosphatase: 90 U/L (ref 38–126)
Anion gap: 11 (ref 5–15)
BUN: 9 mg/dL (ref 6–20)
CO2: 24 mmol/L (ref 22–32)
Calcium: 9.2 mg/dL (ref 8.9–10.3)
Chloride: 101 mmol/L (ref 98–111)
Creatinine, Ser: 1.04 mg/dL (ref 0.61–1.24)
GFR calc Af Amer: >60 mL/min (ref >60)
GFR calc non Af Amer: >60 mL/min (ref >60)
Glucose, Bld: 429 mg/dL — ABNORMAL HIGH (ref 70–99)
Potassium: 4.6 mmol/L (ref 3.5–5.1)
Sodium: 136 mmol/L (ref 135–145)
Total Bilirubin: 0.8 mg/dL (ref 0.3–1.2)
Total Protein: 7.7 g/dL (ref 6.5–8.1)

## 2019-12-06 LAB — TSH: TSH: 0.629 u[IU]/mL (ref 0.350–4.500)

## 2019-12-06 LAB — POCT URINALYSIS DIP (DEVICE)
Bilirubin Urine: NEGATIVE
Glucose, UA: 500 mg/dL — AB
Hgb urine dipstick: NEGATIVE
Ketones, ur: 40 mg/dL — AB
Leukocytes,Ua: NEGATIVE
Nitrite: NEGATIVE
Protein, ur: NEGATIVE mg/dL
Specific Gravity, Urine: 1.015 (ref 1.005–1.030)
Urobilinogen, UA: 1 mg/dL (ref 0.0–1.0)
pH: 6 (ref 5.0–8.0)

## 2019-12-06 LAB — CBG MONITORING, ED: Glucose-Capillary: 396 mg/dL — ABNORMAL HIGH (ref 70–99)

## 2019-12-06 MED ORDER — NYSTATIN 100000 UNIT/GM EX POWD: 1.0000 "application " | Freq: Three times a day (TID) | CUTANEOUS | 0 refills | Status: DC

## 2019-12-06 MED ORDER — NYSTATIN 100000 UNIT/GM EX CREA: TOPICAL_CREAM | CUTANEOUS | 0 refills | Status: DC

## 2019-12-06 NOTE — ED Triage Notes (Addendum)
Fatigue and not feeling well in general.  Symptoms started last Monday 7/5.  Patient has had nausea , no vomiting.  Denies fever Patient reports having difficulty controlling bladder, denies abdominal pain Patient has pealing hands and gestures to groin area as location of similar skin, referred to as a rash  Patient reports having a bacterial infection the last time he had urinary incontinence

## 2019-12-06 NOTE — Discharge Instructions (Signed)
You need to call your doctor in the morning for advice on your diabetes medication You likely need to increase your Metformin Watch carbohydrates in your diet Drink plenty of water Your lab test will be available in MyChart I have prescribed the cream to use twice a day until your rash clears up, and the powder to use twice a day for prevention

## 2019-12-06 NOTE — ED Provider Notes (Signed)
[place]    CSN: [id number] Arrival date & time: 12/06/19  1444      History   Chief Complaint Chief Complaint  Patient presents with  . Fatigue    HPI Evan Leonard is a 59 y.o. male.   HPI  Patient is a 59 year old type II diabetic.  He is noncompliant with medical care.  Has been seen by his PCP since 2019.  He was hospitalized in March of this year for UTI.  He underwent back as directed for follow-up with his PCP.  He states that he is been having more UTIs the last couple of years.  He also has nocturia.  I told him that he should talk to his PCP about the possibility of BPH. He is here today for fatigue.  He is worried he has another urinary infection.  He wants to catch it before he ends up in the hospital. States that he checks his sugar every day.  He states he consistently ranged from 1 15-1 30.  He states his last hemoglobin A1c was "about 7". Looking in the medical record his hemoglobin A1c was 8.9 on 08/22/2019.  Patient states he was unaware that his diabetes is out of control. His blood pressure is low.  He is not on blood pressure medication, and states his blood pressure is always low  He is here for fatigue.  States he has been so tired lately he can hardly keep going.  States that he is up all night urinating and is not sleeping well.  He is using his CPAP. He states he did not eat all day today.  Does not have an appetite.  He did take the Metformin this morning.  Its a little alarming that her fingerstick finds a glucose of 389.  No heat or cold intolerance.  No change in skin or hair  He has a rash in his intertrigo that was painful and "smelly"  He has had no nausea or vomiting.  No fever or chills.  No cough or respiratory symptoms.   Past Medical History:  Diagnosis Date  . Diabetes mellitus, type II ([place]) 02/2013  . Diabetic ulcer of right foot due to type 2 diabetes mellitus ([place]) 03/21/2018  . DVT (deep venous thrombosis) ([place])  05/04/2012   LLE  . Exertional dyspnea 05/04/2012   "isolated episode" (05/05/2012)  . Hepatic steatosis   . History of chickenpox   . Hypertension 03/21/2018  . Obesity, Class III, BMI 40-49.9 (morbid obesity) ([place]) 03/21/2018  . OSA on CPAP   . Peripheral vascular disease (HCC)    varicose veins  . Pulmonary embolism ([place]) 05/04/2012   bilaterally  . Pyelonephritis 04/2017  . Small bowel obstruction ([place]) 09/06/2015  . Varicose veins     Patient Active Problem List   Diagnosis Date Noted  . SBO (small bowel obstruction) ([place]) 08/20/2018  . Diabetic ulcer of right foot due to type 2 diabetes mellitus ([place]) 03/21/2018  . Atypical chest pain 02/09/2018  . Renal stone 05/12/2017  . S/P repair of recurrent ventral hernia 04/26/2017  . Chronic venous insufficiency 01/25/2015  . Diabetes mellitus with hyperglycemia ([place]) 03/31/2013  . Hyponatremia 03/23/2013  . Pulmonary embolism ([place]) 05/05/2012  . Morbid obesity with body mass index of 40.0-44.9 in adult [facility]) 05/05/2012  . Obstructive sleep apnea 05/05/2012  . DVT of left distal popliteal vein 05/05/2012    Past Surgical History:  Procedure Laterality Date  . CYSTOSCOPY W/ URETERAL STENT PLACEMENT Right  05/12/2017   Procedure: CYSTOSCOPY WITH RETROGRADE PYELOGRAM/URETERAL STENT PLACEMENT;  Surgeon: [name], MD;  Location: [facility];  Service: Urology;  Laterality: Right;  . CYSTOSCOPY/URETEROSCOPY/HOLMIUM LASER/STENT PLACEMENT Right 07/29/2017   Procedure: CYSTOSCOPY RIGHT URETEROSCOPY HOLMIUM LASER STENT EXCHANGE;  Surgeon: [name], MD;  Location: [facility];  Service: Urology;  Laterality: Right;  . HERNIA REPAIR     2015  . LAPAROTOMY N/A 12/13/2013   Procedure: EXPLORATORY LAPAROTOMY Repair ventral hernia, without mesh, partial omentectomy;  Surgeon: Gwenyth Ober, MD;  Location: [facility];  Service: General;  Laterality: N/A;  . VENTRAL HERNIA REPAIR N/A 04/26/2017   Procedure: INCARCERATED HERNIA REPAIR VENTRAL ADULT;  Surgeon:  Georganna Skeans, MD;  Location: [facility];  Service: General;  Laterality: N/A;       Home Medications    Prior to Admission medications   Medication Sig Start Date End Date Taking? Authorizing Provider  ELIQUIS 5 MG TABS tablet TAKE 1 TABLET BY MOUTH TWICE A DAY 11/17/19  Yes [name], MD  metFORMIN (GLUCOPHAGE) 500 MG tablet Take 2 tablets (1,000 mg total) by mouth 2 (two) times daily with a meal. 10/01/19  Yes [name], MD  Multiple Vitamin (MULTIVITAMIN WITH MINERALS) TABS tablet Take 1 tablet by mouth daily.    Yes [provider]  furosemide (LASIX) 40 MG tablet Take 1 tablet (40 mg total) by mouth daily as needed for fluid or edema. 02/09/18   [name], MD  ibuprofen (ADVIL,MOTRIN) 200 MG tablet Take 400 mg by mouth every 6 (six) hours as needed for moderate pain.    [provider]  nystatin (MYCOSTATIN/NYSTOP) powder Apply 1 application topically 3 (three) times daily. 12/06/19   Raylene Everts, MD  nystatin cream (MYCOSTATIN) Apply to affected area 2 times daily 12/06/19   Raylene Everts, MD    Family History Family History  Problem Relation Age of Onset  . Diabetes Mother        died age 33- also bleeding ulcer  . Hypertension Mother   . Heart failure Father        in his 71s (patient was 68). day before surgery planned  . Other Maternal Grandmother        states natural causes all grandparents    Social History Social History   Tobacco Use  . Smoking status: Never Smoker  . Smokeless tobacco: Never Used  Vaping Use  . Vaping Use: Never used  Substance Use Topics  . Alcohol use: No    Alcohol/week: 0.0 standard drinks  . Drug use: No     Allergies   Patient has no known allergies.   Review of Systems Review of Systems See HPI  Physical Exam Triage Vital Signs ED Triage Vitals  Enc Vitals Group     BP 12/06/19 1659 114/78     Pulse Rate 12/06/19 1659 92     Resp 12/06/19 1659 20     Temp 12/06/19 1659  98.5 F (36.9 C)     Temp Source 12/06/19 1659 Oral     SpO2 12/06/19 1659 94 %     Weight --      Height --      Head Circumference --      Peak Flow --      Pain Score 12/06/19 1656 0     Pain Loc --      Pain Edu? --      Excl. in [place]? --  No data found.  Updated Vital Signs BP 114/78 (BP Location: Left Arm) Comment (BP Location): large cuff  Pulse 92   Temp 98.5 F (36.9 C) (Oral)   Resp 20   SpO2 94%      Physical Exam Constitutional:      General: He is not in acute distress.    Appearance: He is well-developed. He is obese.     Comments: Appears tired   HENT:     Head: Normocephalic and atraumatic.     Right Ear: Tympanic membrane, ear canal and external ear normal.     Left Ear: Tympanic membrane, ear canal and external ear normal.     Nose: Nose normal.     Mouth/Throat:     Mouth: Mucous membranes are dry.     Pharynx: No posterior oropharyngeal erythema.  Eyes:     Conjunctiva/sclera: Conjunctivae normal.     Pupils: Pupils are equal, round, and reactive to light.  Cardiovascular:     Rate and Rhythm: Normal rate and regular rhythm.     Heart sounds: Normal heart sounds.  Pulmonary:     Effort: Pulmonary effort is normal. No respiratory distress.     Breath sounds: Normal breath sounds.  Abdominal:     General: There is no distension.     Palpations: Abdomen is soft.     Tenderness: There is no abdominal tenderness.     Comments: rotund  Musculoskeletal:        General: Normal range of motion.     Cervical back: Normal range of motion.  Lymphadenopathy:     Cervical: No cervical adenopathy.  Skin:    General: Skin is warm and dry.     Comments: Both palms are scaling Lower abdomen and groin with intertrigo  Neurological:     General: No focal deficit present.     Mental Status: He is alert.  Psychiatric:        Mood and Affect: Mood normal.        Behavior: Behavior normal.      UC Treatments / Results  Labs (all labs ordered are  listed, but only abnormal results are displayed) Labs Reviewed  POCT URINALYSIS DIP (DEVICE) - Abnormal; Notable for the following components:      Result Value   Glucose, UA 500 (*)    Ketones, ur 40 (*)    All other components within normal limits  CBG MONITORING, ED - Abnormal; Notable for the following components:   Glucose-Capillary 396 (*)    All other components within normal limits  CBC  COMPREHENSIVE METABOLIC PANEL  TSH    EKG   Radiology No results found.  Procedures Procedures (including critical care time)  Medications Ordered in UC Medications - No data to display  Initial Impression / Assessment and Plan / UC Course  I have reviewed the triage vital signs and the nursing notes.  Pertinent labs & imaging results that were available during my care of the patient were reviewed by me and considered in my medical decision making (see chart for details).     Reviewed with patient that his primary problem is hyperglycemia Will do labs and send note to PCP Final Clinical Impressions(s) / UC Diagnoses   Final diagnoses:  Fatigue, unspecified type  Hyperglycemia  Candidal intertrigo     Discharge Instructions     You need to call your doctor in the morning for advice on your diabetes medication You likely need to increase your  Metformin Watch carbohydrates in your diet Drink plenty of water Your lab test will be available in MyChart I have prescribed the cream to use twice a day until your rash clears up, and the powder to use twice a day for prevention   ED Prescriptions    Medication Sig Dispense Auth. Provider   nystatin cream (MYCOSTATIN) Apply to affected area 2 times daily 30 g Raylene Everts, MD   nystatin (MYCOSTATIN/NYSTOP) powder Apply 1 application topically 3 (three) times daily. 15 g Raylene Everts, MD     PDMP not reviewed this encounter.   Raylene Everts, MD 12/06/19 2123

## 2019-12-09 ENCOUNTER — Ambulatory Visit: Payer: PRIVATE HEALTH INSURANCE | Admitting: Family Medicine

## 2019-12-09 ENCOUNTER — Encounter: Payer: Self-pay | Admitting: Family Medicine

## 2019-12-09 ENCOUNTER — Other Ambulatory Visit: Payer: Self-pay

## 2019-12-09 VITALS — BP 120/72 | HR 74 | Temp 98.1°F | Ht 75.0 in | Wt 337.0 lb

## 2019-12-09 DIAGNOSIS — E1165 Type 2 diabetes mellitus with hyperglycemia: Secondary | ICD-10-CM

## 2019-12-09 DIAGNOSIS — Z1211 Encounter for screening for malignant neoplasm of colon: Secondary | ICD-10-CM

## 2019-12-09 LAB — MICROALBUMIN / CREATININE URINE RATIO
Creatinine,U: 73 mg/dL
Microalb Creat Ratio: 1 mg/g (ref 0.0–30.0)
Microalb, Ur: <0.7 mg/dL (ref 0.0–1.9)

## 2019-12-09 LAB — POCT GLYCOSYLATED HEMOGLOBIN (HGB A1C): Hemoglobin A1C: 12.8 % — AB (ref 4.0–5.6)

## 2019-12-09 MED ORDER — METFORMIN HCL ER 500 MG PO TB24: 1000.0000 mg | ORAL_TABLET | Freq: Two times a day (BID) | ORAL | 3 refills | Status: DC

## 2019-12-09 MED ORDER — BLOOD GLUCOSE MONITOR KIT: PACK | 0 refills | Status: DC

## 2019-12-09 NOTE — Progress Notes (Signed)
Pt aware.

## 2019-12-09 NOTE — Patient Instructions (Addendum)
Health Maintenance Due  Topic Date Due  . COLONOSCOPY order placed  Never done  . OPHTHALMOLOGY EXAM  04/26/2014  . URINE MICROALBUMIN order placed  06/18/2018  . COVID-19 Vaccine (2 - Pfizer 2-dose series) 11/14/2019   Diabetes education-Get on youtube and access videos by Dr. Smiley Houseman (an endocrinologist. Listen to diabetes education parts 1-5)  Diabetes is poorly controlled and has significantly worsened from our last visit when his A1c was 7.  At last visit in March at hospital trending up to 8.9 -He does have some things in his diet he thinks he can change such as cutting out sweet tea and reducing carbs -gave diabetes education information -Change Metformin 1000 mg twice a day to extended release version to see if that helps with nausea -Add Jardiance 10 mg each morning-he will really have to work to stay well-hydrated with this -Due to risks with kidneys with how high sugar is recommended 1 month follow-up-recheck kidney function at that time  - team please add jardiance to med list and write in sample 10mg   Recommended follow up: august physical   Hyperglycemia Hyperglycemia is when the sugar (glucose) level in your blood is too high. It may not cause symptoms. If you do have symptoms, they may include warning signs, such as:  Feeling more thirsty than normal.  Hunger.  Feeling tired.  Needing to pee (urinate) more than normal.  Blurry eyesight (vision). You may get other symptoms as it gets worse, such as:  Dry mouth.  Not being hungry (loss of appetite).  Fruity-smelling breath.  Weakness.  Weight gain or loss that is not planned. Weight loss may be fast.  A tingling or numb feeling in your hands or feet.  Headache.  Skin that does not bounce back quickly when it is lightly pinched and released (poor skin turgor).  Pain in your belly (abdomen).  Cuts or bruises that heal slowly. High blood sugar can happen to people who do or do not have  diabetes. High blood sugar can happen slowly or quickly, and it can be an emergency. Follow these instructions at home: General instructions  Take over-the-counter and prescription medicines only as told by your doctor.  Do not use products that contain nicotine or tobacco, such as cigarettes and e-cigarettes. If you need help quitting, ask your doctor.  Limit alcohol intake to no more than 1 drink per day for nonpregnant women and 2 drinks per day for men. One drink equals 12 oz of beer, 5 oz of wine, or 1 oz of hard liquor.  Manage stress. If you need help with this, ask your doctor.  Keep all follow-up visits as told by your doctor. This is important. Eating and drinking   Stay at a healthy weight.  Exercise regularly, as told by your doctor.  Drink enough fluid, especially when you: ? Exercise. ? Get sick. ? Are in hot temperatures.  Eat healthy foods, such as: ? Low-fat (lean) proteins. ? Complex carbs (complex carbohydrates), such as whole wheat bread or brown rice. ? Fresh fruits and vegetables. ? Low-fat dairy products. ? Healthy fats.  Drink enough fluid to keep your pee (urine) clear or pale yellow. If you have diabetes:   Make sure you know the symptoms of hyperglycemia.  Follow your diabetes management plan, as told by your doctor. Make sure you: ? Take insulin and medicines as told. ? Follow your exercise plan. ? Follow your meal plan. Eat on time. Do not skip meals. ?  Check your blood sugar as often as told. Make sure to check before and after exercise. If you exercise longer or in a different way than you normally do, check your blood sugar more often. ? Follow your sick day plan whenever you cannot eat or drink normally. Make this plan ahead of time with your doctor.  Share your diabetes management plan with people in your workplace, school, and household.  Check your urine for ketones when you are ill and as told by your doctor.  Carry a card or wear  jewelry that says that you have diabetes. Contact a doctor if:  Your blood sugar level is higher than 240 mg/dL (13.3 mmol/L) for 2 days in a row.  You have problems keeping your blood sugar in your target range.  High blood sugar happens often for you. Get help right away if:  You have trouble breathing.  You have a change in how you think, feel, or act (mental status).  You feel sick to your stomach (nauseous), and that feeling does not go away.  You cannot stop throwing up (vomiting). These symptoms may be an emergency. Do not wait to see if the symptoms will go away. Get medical help right away. Call your local emergency services (911 in the [place] Do not drive yourself to the hospital. Summary  Hyperglycemia is when the sugar (glucose) level in your blood is too high.  High blood sugar can happen to people who do or do not have diabetes.  Make sure you drink enough fluids, eat healthy foods, and exercise regularly.  Contact your doctor if you have problems keeping your blood sugar in your target range. This information is not intended to replace advice given to you by your health care provider. Make sure you discuss any questions you have with your health care provider. Document Revised: 01/29/2016 Document Reviewed: 01/29/2016 Elsevier Patient Education  [place].

## 2019-12-09 NOTE — Progress Notes (Signed)
Phone [phone] In person visit   Subjective:   Evan Leonard is a 59 y.o. year old very pleasant male patient who presents for/with See problem oriented charting Chief Complaint  Patient presents with  . Follow-up    from Urgent care    This visit occurred during the SARS-CoV-2 public health emergency.  Safety protocols were in place, including screening questions prior to the visit, additional usage of staff PPE, and extensive cleaning of exam room while observing appropriate contact time as indicated for disinfecting solutions.   Past Medical History-  Patient Active Problem List   Diagnosis Date Noted  . Atypical chest pain 02/09/2018    Priority: High  . Diabetes mellitus with hyperglycemia ([place]) 03/31/2013    Priority: High  . Pulmonary embolism ([place]) 05/05/2012    Priority: High  . Morbid obesity with body mass index of 40.0-44.9 in adult [facility]) 05/05/2012    Priority: High  . DVT of left distal popliteal vein 05/05/2012    Priority: High  . S/P repair of recurrent ventral hernia 04/26/2017    Priority: Medium  . Chronic venous insufficiency 01/25/2015    Priority: Medium  . Obstructive sleep apnea 05/05/2012    Priority: Medium  . Renal stone 05/12/2017    Priority: Low  . Hyponatremia 03/23/2013    Priority: Low  . SBO (small bowel obstruction) ([place]) 08/20/2018  . Diabetic ulcer of right foot due to type 2 diabetes mellitus ([place]) 03/21/2018    Medications- reviewed and updated Current Outpatient Medications  Medication Sig Dispense Refill  . ELIQUIS 5 MG TABS tablet TAKE 1 TABLET BY MOUTH TWICE A DAY 60 tablet 5  . furosemide (LASIX) 40 MG tablet Take 1 tablet (40 mg total) by mouth daily as needed for fluid or edema. 30 tablet 2  . ibuprofen (ADVIL,MOTRIN) 200 MG tablet Take 400 mg by mouth every 6 (six) hours as needed for moderate pain.    . metFORMIN (GLUCOPHAGE) 500 MG tablet Take 2 tablets (1,000 mg total) by mouth 2 (two) times daily with a meal.  120 tablet 3  . Multiple Vitamin (MULTIVITAMIN WITH MINERALS) TABS tablet Take 1 tablet by mouth daily.     Marland Kitchen nystatin (MYCOSTATIN/NYSTOP) powder Apply 1 application topically 3 (three) times daily. 15 g 0  . nystatin cream (MYCOSTATIN) Apply to affected area 2 times daily 30 g 0   No current facility-administered medications for this visit.     Objective:  BP 120/72   Pulse 74   Temp 98.1 F (36.7 C) (Temporal)   Ht '6\' 3"'  (1.905 m)   Wt (!) 337 lb (152.9 kg)   SpO2 98%   BMI 42.12 kg/m  Gen: NAD, resting comfortably CV: RRR no murmurs rubs or gallops Lungs: CTAB no crackles, wheeze, rhonchi Ext: no edema Skin: warm, dry     Assessment and Plan   # Diabetes S: Medication:  Patient has been on metformin 548m two tab bid. Has been on for several years. Admits to having some nausea every day.   CBGs- takes fasting daily have been running in 115-150.   Exercise and diet- 2 sweet tea a day feels like he can cut. Overdoes carbs   Has not been exercising due to fatigue for about a week. .  Lab Results  Component Value Date   HGBA1C 12.8 (A) 12/09/2019   HGBA1C hospitalization for which he did not follow up with me after 8.9 (H) 08/22/2019   HGBA1C our last visit 7.0 (  H) 03/21/2018    A/P: Diabetes is poorly controlled and has significantly worsened from our last visit when his A1c was 7.  At last visit in March at hospital trending up to 8.9 -He does have some things in his diet he thinks he can change such as cutting out sweet tea and reducing carbs.  I am hopeful given dietary indiscretions and potential for change that we can avoid insulin but I was honest with him that was a very real possibility -gave diabetes education information -Change Metformin 1000 mg twice a day to extended release version to see if that helps with nausea -Add Jardiance 10 mg each morning-he will really have to work to stay well-hydrated with this -Due to risks with kidneys with how high sugar is  recommended 1 month follow-up-recheck kidney function at that time  -Also really need lipid panel at follow-up and strongly suspect he needs statin  -We jointly agreed goal off on second COVID-19 vaccination until evening sugars under 250-he is now to start checking twice a day  Recommended follow up: Keep physical in August Future Appointments  Date Time Provider [place]  12/27/2019  1:40 PM [name], MD [facility]    Lab/Order associations:   ICD-10-CM   1. Type 2 diabetes mellitus with hyperglycemia, without long-term current use of insulin (HCC)  E11.65 Microalbumin / creatinine urine ratio    blood glucose meter kit and supplies KIT    POCT glycosylated hemoglobin (Hb A1C)  2. Screening for colon cancer  Z12.11 Ambulatory referral to Gastroenterology    Meds ordered this encounter  Medications  . blood glucose meter kit and supplies KIT    Sig: Dispense based on patient and insurance preference. Use up to four times daily as directed. E11.65    Dispense:  1 each    Refill:  0    Order Specific Question:   Number of strips    Answer:   100    Order Specific Question:   Number of lancets    Answer:   100  . metFORMIN (GLUCOPHAGE XR) 500 MG 24 hr tablet    Sig: Take 2 tablets (1,000 mg total) by mouth in the morning and at bedtime.    Dispense:  180 each    Refill:  3     Return precautions advised.  Garret Reddish, MD

## 2019-12-27 ENCOUNTER — Other Ambulatory Visit: Payer: Self-pay

## 2019-12-27 ENCOUNTER — Encounter: Payer: Self-pay | Admitting: Family Medicine

## 2019-12-27 ENCOUNTER — Ambulatory Visit (INDEPENDENT_AMBULATORY_CARE_PROVIDER_SITE_OTHER): Payer: PRIVATE HEALTH INSURANCE | Admitting: Family Medicine

## 2019-12-27 VITALS — BP 124/68 | HR 82 | Temp 98.3°F | Ht 75.0 in | Wt 331.0 lb

## 2019-12-27 DIAGNOSIS — N529 Male erectile dysfunction, unspecified: Secondary | ICD-10-CM

## 2019-12-27 DIAGNOSIS — I2699 Other pulmonary embolism without acute cor pulmonale: Secondary | ICD-10-CM | POA: Diagnosis not present

## 2019-12-27 DIAGNOSIS — Z0001 Encounter for general adult medical examination with abnormal findings: Secondary | ICD-10-CM

## 2019-12-27 DIAGNOSIS — Z6841 Body Mass Index (BMI) 40.0 and over, adult: Secondary | ICD-10-CM

## 2019-12-27 DIAGNOSIS — Z0000 Encounter for general adult medical examination without abnormal findings: Secondary | ICD-10-CM

## 2019-12-27 DIAGNOSIS — L309 Dermatitis, unspecified: Secondary | ICD-10-CM | POA: Insufficient documentation

## 2019-12-27 DIAGNOSIS — I82432 Acute embolism and thrombosis of left popliteal vein: Secondary | ICD-10-CM

## 2019-12-27 DIAGNOSIS — E1165 Type 2 diabetes mellitus with hyperglycemia: Secondary | ICD-10-CM

## 2019-12-27 DIAGNOSIS — G4733 Obstructive sleep apnea (adult) (pediatric): Secondary | ICD-10-CM

## 2019-12-27 DIAGNOSIS — Z125 Encounter for screening for malignant neoplasm of prostate: Secondary | ICD-10-CM

## 2019-12-27 DIAGNOSIS — I872 Venous insufficiency (chronic) (peripheral): Secondary | ICD-10-CM

## 2019-12-27 MED ORDER — SILDENAFIL CITRATE 20 MG PO TABS: ORAL_TABLET | ORAL | 3 refills | Status: DC

## 2019-12-27 MED ORDER — TRIAMCINOLONE ACETONIDE 0.5 % EX OINT
1.0000 | TOPICAL_OINTMENT | Freq: Two times a day (BID) | CUTANEOUS | 1 refills | Status: DC
Start: 2019-12-27 — End: 2021-02-26

## 2019-12-27 NOTE — Progress Notes (Signed)
Phone [phone] In person visit   Subjective:   Evan Leonard is a 59 y.o. year old very pleasant male patient who presents for/with See problem oriented charting Chief Complaint  Patient presents with  . Annual Exam    fasting   . Diabetes   This visit occurred during the SARS-CoV-2 public health emergency.  Safety protocols were in place, including screening questions prior to the visit, additional usage of staff PPE, and extensive cleaning of exam room while observing appropriate contact time as indicated for disinfecting solutions.   Past Medical History-  Patient Active Problem List   Diagnosis Date Noted  . Atypical chest pain 02/09/2018    Priority: High  . Diabetes mellitus with hyperglycemia ([place]) 03/31/2013    Priority: High  . Pulmonary embolism ([place]) 05/05/2012    Priority: High  . Morbid obesity with body mass index of 40.0-44.9 in adult [facility]) 05/05/2012    Priority: High  . DVT of left distal popliteal vein 05/05/2012    Priority: High  . S/P repair of recurrent ventral hernia 04/26/2017    Priority: Medium  . Chronic venous insufficiency 01/25/2015    Priority: Medium  . Obstructive sleep apnea 05/05/2012    Priority: Medium  . Renal stone 05/12/2017    Priority: Low  . Hyponatremia 03/23/2013    Priority: Low  . Eczema 12/27/2019  . Erectile dysfunction 12/27/2019  . SBO (small bowel obstruction) ([place]) 08/20/2018  . Diabetic ulcer of right foot due to type 2 diabetes mellitus ([place]) 03/21/2018    Medications- reviewed and updated Current Outpatient Medications  Medication Sig Dispense Refill  . blood glucose meter kit and supplies KIT Dispense based on patient and insurance preference. Use up to four times daily as directed. E11.65 1 each 0  . ELIQUIS 5 MG TABS tablet TAKE 1 TABLET BY MOUTH TWICE A DAY 60 tablet 5  . empagliflozin (JARDIANCE) 10 MG TABS tablet Take by mouth daily. Sample given-#28    . metFORMIN (GLUCOPHAGE XR) 500 MG 24 hr  tablet Take 2 tablets (1,000 mg total) by mouth in the morning and at bedtime. 180 each 3  . Multiple Vitamin (MULTIVITAMIN WITH MINERALS) TABS tablet Take 1 tablet by mouth daily.     Marland Kitchen nystatin (MYCOSTATIN/NYSTOP) powder Apply 1 application topically 3 (three) times daily. 15 g 0  . nystatin cream (MYCOSTATIN) Apply to affected area 2 times daily 30 g 0  . furosemide (LASIX) 40 MG tablet Take 1 tablet (40 mg total) by mouth daily as needed for fluid or edema. (Patient not taking: Reported on 12/27/2019) 30 tablet 2  . sildenafil (REVATIO) 20 MG tablet Take 2-5 tablets as needed once every 48 hours for erectile dysfunction 30 tablet 3  . triamcinolone ointment (KENALOG) 0.5 % Apply 1 application topically 2 (two) times daily. For 7-14 days maximum 60 g 1   No current facility-administered medications for this visit.     Objective:  BP 124/68   Pulse 82   Temp 98.3 F (36.8 C) (Temporal)   Ht '6\' 3"'$  (1.905 m)   Wt (!) 331 lb (150.1 kg)   SpO2 95%   BMI 41.37 kg/m  Gen: NAD, resting comfortably Skin: erythema and cracking skin on hands with several areas of blood noted.     Assessment and Plan   Eczema S: 4-5 weeks total  Of very dry cracking skin on both palms. Seen in urgent care.  They prescribed nystatin for candidal intertrigo-patient is also mentioning  that on his hands without any relief. A/P: Possible dyshidrotic eczema though no obvious vesicles.  We will try triamcinolone ointment 0.5% for 1 to 2 weeks.  If symptoms fail to improve or worsen he will let me know we will refer to her dermatology  Erectile dysfunction S: Started having issues a few years ago with firmness and erections.  Has not tried anything today.  Denies chest pain or shortness of breath with activity. A/P: poor control. We will send in sildenafil to try-may want to run this through GoodRx as may be cheaper   Recommended follow up: 3 month 1 day follow up from last visit to recheck a1c Future Appointments   Date Time Provider [place]  03/27/2020  3:00 PM [name], MD [facility]    Lab/Order associations:   ICD-10-CM   1. Encounter for general adult medical examination with abnormal findings  Z00.01   2. Eczema, unspecified type  L30.9   3. Erectile dysfunction, unspecified erectile dysfunction type  N52.9   4. Other pulmonary embolism without acute cor pulmonale, unspecified chronicity (HCC)  I26.99   5. Morbid obesity with body mass index of 40.0-44.9 in adult (HCC)  E66.01    Z68.41   6. Deep vein thrombosis (DVT) of popliteal vein of left lower extremity, unspecified chronicity (HCC)  I82.432   7. Type 2 diabetes mellitus with hyperglycemia, without long-term current use of insulin (HCC)  E11.65 Comprehensive metabolic panel    Lipid panel    Lipid panel    Comprehensive metabolic panel  8. Obstructive sleep apnea  G47.33   9. Chronic venous insufficiency  I87.2   10. Screening for prostate cancer  Z12.5 PSA    PSA    CANCELED: PSA    Meds ordered this encounter  Medications  . triamcinolone ointment (KENALOG) 0.5 %    Sig: Apply 1 application topically 2 (two) times daily. For 7-14 days maximum    Dispense:  60 g    Refill:  1  . sildenafil (REVATIO) 20 MG tablet    Sig: Take 2-5 tablets as needed once every 48 hours for erectile dysfunction    Dispense:  30 tablet    Refill:  3    Run through goodrx if expensive through insurance   Time Spent: 11 minutes of total time (2:40-2:51 PM outside of physical) was spent on the date of the encounter performing the following actions: chart review prior to seeing the patient, obtaining history, performing a medically necessary exam, counseling on the treatment plan, placing orders, and documenting in our EHR.   Return precautions advised.  Garret Reddish, MD

## 2019-12-27 NOTE — Patient Instructions (Addendum)
Health Maintenance Due  Topic Date Due  . COLONOSCOPY has not had yet - please call to get this done Never done  . OPHTHALMOLOGY EXAM has not made app-please call to do so 04/26/2014  . COVID-19 Vaccine (2 - Pfizer 2-dose series) holding off due to readings on sugar previously- go ahead and do this now 11/14/2019  . INFLUENZA VACCINE do not have in office yet-please call back in 1 to 2 months or if you receive this at home please let us know.  12/26/2019   Also consider Shingrix a few weeks after covid shot. We can give this in office here if youd like or can do at pharmacy  Possible dyshidrotic eczema though no obvious vesicles.  We will try triamcinolone ointment 0.5% for 1 to 2 weeks.  If symptoms fail to improve or worsen he will let me know we will refer to her dermatology  Try Tylenol arthritis instead of ibuprofen just to be cautious with Eliquis  Please update dental exam.   Recommended follow up: 03/11/2020 or later for follow up so we can check a1c again

## 2019-12-27 NOTE — Addendum Note (Signed)
Addended by: Liliane Channel on: 12/27/2019 02:57 PM   Modules accepted: Orders

## 2019-12-27 NOTE — Progress Notes (Addendum)
Phone: [phone]   Subjective:  Patient presents today for their annual physical. Chief complaint-noted.   See problem oriented charting- Review of Systems  Constitutional: Negative for chills and fever.  HENT: Negative for hearing loss and tinnitus.   Eyes: Negative for blurred vision, double vision and photophobia.  Respiratory: Negative for cough, shortness of breath and wheezing.   Cardiovascular: Negative for chest pain, palpitations and leg swelling.  Gastrointestinal: Negative for heartburn, nausea and vomiting.  Genitourinary: Negative for dysuria, frequency and urgency.  Musculoskeletal: Negative for back pain, joint pain and neck pain.  Skin: Positive for rash.       B/l hands   Neurological: Negative for dizziness, seizures, weakness and headaches.  Endo/Heme/Allergies: Does not bruise/bleed easily.  Psychiatric/Behavioral: Negative for depression, hallucinations and suicidal ideas. The patient does not have insomnia.    The following were reviewed and entered/updated in epic: Past Medical History:  Diagnosis Date  . Diabetes mellitus, type II ([place]) 02/2013  . Diabetic ulcer of right foot due to type 2 diabetes mellitus ([place]) 03/21/2018  . DVT (deep venous thrombosis) ([place]) 05/04/2012   LLE  . Exertional dyspnea 05/04/2012   "isolated episode" (05/05/2012)  . Hepatic steatosis   . History of chickenpox   . Hypertension 03/21/2018  . Obesity, Class III, BMI 40-49.9 (morbid obesity) ([place]) 03/21/2018  . OSA on CPAP   . Peripheral vascular disease (HCC)    varicose veins  . Pulmonary embolism ([place]) 05/04/2012   bilaterally  . Pyelonephritis 04/2017  . Small bowel obstruction ([place]) 09/06/2015  . Varicose veins    Patient Active Problem List   Diagnosis Date Noted  . Atypical chest pain 02/09/2018    Priority: High  . Diabetes mellitus with hyperglycemia ([place]) 03/31/2013    Priority: High  . Pulmonary embolism ([place]) 05/05/2012    Priority: High  . Morbid  obesity with body mass index of 40.0-44.9 in adult [facility]) 05/05/2012    Priority: High  . DVT of left distal popliteal vein 05/05/2012    Priority: High  . S/P repair of recurrent ventral hernia 04/26/2017    Priority: Medium  . Chronic venous insufficiency 01/25/2015    Priority: Medium  . Obstructive sleep apnea 05/05/2012    Priority: Medium  . Renal stone 05/12/2017    Priority: Low  . Hyponatremia 03/23/2013    Priority: Low  . Eczema 12/27/2019  . Erectile dysfunction 12/27/2019  . SBO (small bowel obstruction) ([place]) 08/20/2018  . Diabetic ulcer of right foot due to type 2 diabetes mellitus ([place]) 03/21/2018   Past Surgical History:  Procedure Laterality Date  . CYSTOSCOPY W/ URETERAL STENT PLACEMENT Right 05/12/2017   Procedure: CYSTOSCOPY WITH RETROGRADE PYELOGRAM/URETERAL STENT PLACEMENT;  Surgeon: [name], MD;  Location: [facility];  Service: Urology;  Laterality: Right;  . CYSTOSCOPY/URETEROSCOPY/HOLMIUM LASER/STENT PLACEMENT Right 07/29/2017   Procedure: CYSTOSCOPY RIGHT URETEROSCOPY HOLMIUM LASER STENT EXCHANGE;  Surgeon: [name], MD;  Location: [facility];  Service: Urology;  Laterality: Right;  . HERNIA REPAIR     2015  . LAPAROTOMY N/A 12/13/2013   Procedure: EXPLORATORY LAPAROTOMY Repair ventral hernia, without mesh, partial omentectomy;  Surgeon: Gwenyth Ober, MD;  Location: [facility];  Service: General;  Laterality: N/A;  . VENTRAL HERNIA REPAIR N/A 04/26/2017   Procedure: INCARCERATED HERNIA REPAIR VENTRAL ADULT;  Surgeon: Georganna Skeans, MD;  Location: [facility];  Service: General;  Laterality: N/A;    Family History  Problem Relation Age of Onset  . Diabetes Mother  died age 66- also bleeding ulcer  . Hypertension Mother   . Heart failure Father        in his 79s (patient was 57). day before surgery planned  . Other Maternal Grandmother        states natural causes all grandparents    Medications- reviewed and updated Current Outpatient Medications    Medication Sig Dispense Refill  . blood glucose meter kit and supplies KIT Dispense based on patient and insurance preference. Use up to four times daily as directed. E11.65 1 each 0  . ELIQUIS 5 MG TABS tablet TAKE 1 TABLET BY MOUTH TWICE A DAY 60 tablet 5  . empagliflozin (JARDIANCE) 10 MG TABS tablet Take by mouth daily. Sample given-#28    . furosemide (LASIX) 40 MG tablet Take 1 tablet (40 mg total) by mouth daily as needed for fluid or edema. 30 tablet 2  . ibuprofen (ADVIL,MOTRIN) 200 MG tablet Take 400 mg by mouth every 6 (six) hours as needed for moderate pain.    . metFORMIN (GLUCOPHAGE XR) 500 MG 24 hr tablet Take 2 tablets (1,000 mg total) by mouth in the morning and at bedtime. 180 each 3  . Multiple Vitamin (MULTIVITAMIN WITH MINERALS) TABS tablet Take 1 tablet by mouth daily.     Marland Kitchen nystatin (MYCOSTATIN/NYSTOP) powder Apply 1 application topically 3 (three) times daily. 15 g 0  . nystatin cream (MYCOSTATIN) Apply to affected area 2 times daily 30 g 0   No current facility-administered medications for this visit.    Allergies-reviewed and updated No Known Allergies  Social History   Social History Narrative   Single. Lives alone. Friends that live close. No pets.       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       Hobbies: golf, basketball, walking   Tired from work right now   Objective  Objective:  BP 124/68   Pulse 82   Temp 98.3 F (36.8 C) (Temporal)   Ht _0  (1.905 m)   Wt (!) 331 lb (150.1 kg)   SpO2 95%   BMI 41.37 kg/m  Gen: NAD, resting comfortably HEENT: Mucous membranes are moist. Oropharynx normal Neck: no thyromegaly CV: RRR no murmurs rubs or gallops Lungs: CTAB no crackles, wheeze, rhonchi Abdomen: soft/nontender/nondistended/normal bowel sounds. No rebound or guarding.  Ext: 1+ edema Skin: warm, dry Neuro: grossly normal, moves all extremities, PERRLA Rectal: normal tone, normal sized prostate, no masses or tenderness     Assessment and Plan  59 y.o. male presenting for annual physical.  Health Maintenance counseling: 1. Anticipatory guidance: Patient counseled regarding regular dental exams  Has been over 3 years since last visit, eye exams he is due now ,   avoiding smoking and second hand smoke , limiting alcohol to 2 beverages per day - Never drinks    2. Risk factor reduction:  Advised patient of need for regular exercise and diet rich and fruits and vegetables to reduce risk of heart attack and stroke. Exercise-  walks a lot at work. Tries to walk every other day for about 20 minutes at home.  Diet- working on reducing carbs and sugars. Tries not to eat late at night.  Tremendous improvement with weight down another 6 pounds-still morbidly obese with BMI over 40 Wt Readings from Last 3 Encounters:  12/27/19 (!) 331 lb (150.1 kg)  12/09/19 (!) 337 lb (152.9 kg)  08/21/19 (!) 350 lb 14.4 oz (159.2 kg)  3. Immunizations/screenings/ancillary studies-needs final  COVID-19 vaccinationbut we have been waiting until sugar levels improved- better now can proceed.  Immunization History  Administered Date(s) Administered  . Influenza Split 05/27/2012  . Influenza,inj,Quad PF,6+ Mos 02/15/2013, 03/01/2014, 06/19/2015, 04/16/2016, 04/29/2017, 02/09/2018, 08/21/2018  . PFIZER SARS-COV-2 Vaccination 10/24/2019  . Pneumococcal Polysaccharide-23 03/31/2013, 06/19/2015  . Tdap 05/09/2016  4. Prostate cancer screening- we will get a baseline PSA and rectal exam today  5. Colon cancer screening - has never had colonoscopy-order was placed for GI referral at last visit. He is going to call back to set up 6. Skin cancer screening- Never seen by dermatology.  advised regular sunscreen use. Denies worrisome, changing, or new skin lesions.Rash on both hands. Does not feel like cream is helping.  Seen in emergency room on December 06, 2019-treated for candidal intertrigo in groin- corn starch helped.  7. Never smoker 8. STD screening -  Declines as no unprotected sex since his last check  Status of chronic or acute concerns   # Diabetes S: Medication: Metformin 500 mg ER two tabs bid-change from instant release last visit, Jardiance 10 mg each morning-started last visit. He has not had any nausea from the metformin. He has had lower sugars and feeling much better.  CBGs- home readings have gone down  fasting: 200'sor above, now running about 115-120.  Exercise and diet-  walks a lot at work and trying to walk 20 minutes every other day at home. He is also working on reducing carbs and sugars in diet. Sparing diet pepsi twice a week and cut sweet tea out.  Lab Results  Component Value Date   HGBA1C 12.8 (A) 12/09/2019   HGBA1C 8.9 (H) 08/22/2019   HGBA1C 7.0 (H) 03/21/2018   A/P: sounds to have significantly improved- we will need to do a visit 03/11/2020 or just after to recheck a1c but I think we will see significant progress with fasting sugars down from 200s to 115-120  #sleeping better/overall feels better with sugars better.   Rash on hands-see separate note Jeanett Schlein  #Pulmonary embolism/DVT -long-term Eliquis S: Anticoagulated with this Eliquis 5 mg twice daily.  We discussed should avoid NSAIDs and stopped ibuprofen on medication list A/P: Stable. Continue current medications.    #OSA-compliant with CPAP. Thinks high sugar was affecting sleep before.   Lab Results  Component Value Date   HGBA1C 12.8 (A) 12/09/2019   Recommended follow up: 03/11/2020 or later for follow up so we can check a1c again  Lab/Order associations: fasting   ICD-10-CM   1. Encounter for general adult medical examination with abnormal findings  Z00.01   4. Other pulmonary embolism without acute cor pulmonale, unspecified chronicity (HCC)  I26.99   5. Morbid obesity with body mass index of 40.0-44.9 in adult (HCC)  E66.01    Z68.41   6. Deep vein thrombosis (DVT) of popliteal vein of left lower extremity, unspecified chronicity  (HCC)  I82.432   7. Type 2 diabetes mellitus with hyperglycemia, without long-term current use of insulin (HCC)  E11.65 Comprehensive metabolic panel    Lipid panel    Lipid panel    Comprehensive metabolic panel  8. Obstructive sleep apnea  G47.33   9. Chronic venous insufficiency  I87.2   10. Screening for prostate cancer  Z12.5 PSA    PSA    CANCELED: PSA    Meds ordered this encounter  Medications  . triamcinolone ointment (KENALOG) 0.5 %    Sig: Apply 1 application topically 2 (two) times daily. For  7-14 days maximum    Dispense:  60 g    Refill:  1  . sildenafil (REVATIO) 20 MG tablet    Sig: Take 2-5 tablets as needed once every 48 hours for erectile dysfunction    Dispense:  30 tablet    Refill:  3    Run through goodrx if expensive through insurance   Return precautions advised.  Garret Reddish, MD

## 2019-12-27 NOTE — Assessment & Plan Note (Signed)
S: 4-5 weeks total  Of very dry cracking skin on both palms. Seen in urgent care.  They prescribed nystatin for candidal intertrigo-patient is also mentioning that on his hands without any relief. A/P: Possible dyshidrotic eczema though no obvious vesicles.  We will try triamcinolone ointment 0.5% for 1 to 2 weeks.  If symptoms fail to improve or worsen he will let me know we will refer to her dermatology

## 2019-12-27 NOTE — Assessment & Plan Note (Signed)
S: Started having issues a few years ago with firmness and erections.  Has not tried anything today.  Denies chest pain or shortness of breath with activity. A/P: poor control. We will send in sildenafil to try-may want to run this through GoodRx as may be cheaper

## 2019-12-28 LAB — LIPID PANEL
Cholesterol: 196 mg/dL (ref <200)
HDL: 36 mg/dL — ABNORMAL LOW (ref >=40)
LDL Cholesterol (Calc): 131 mg/dL (calc) — ABNORMAL HIGH
Non-HDL Cholesterol (Calc): 160 mg/dL (calc) — ABNORMAL HIGH (ref <130)
Total CHOL/HDL Ratio: 5.4 (calc) — ABNORMAL HIGH (ref <5.0)
Triglycerides: 155 mg/dL — ABNORMAL HIGH (ref <150)

## 2019-12-28 LAB — COMPREHENSIVE METABOLIC PANEL
AG Ratio: 1.2 (calc) (ref 1.0–2.5)
ALT: 36 U/L (ref 9–46)
AST: 27 U/L (ref 10–35)
Albumin: 4 g/dL (ref 3.6–5.1)
Alkaline phosphatase (APISO): 63 U/L (ref 35–144)
BUN: 15 mg/dL (ref 7–25)
CO2: 26 mmol/L (ref 20–32)
Calcium: 9.8 mg/dL (ref 8.6–10.3)
Chloride: 106 mmol/L (ref 98–110)
Creat: 1.12 mg/dL (ref 0.70–1.25)
Globulin: 3.4 g/dL (calc) (ref 1.9–3.7)
Glucose, Bld: 120 mg/dL — ABNORMAL HIGH (ref 65–99)
Potassium: 4.6 mmol/L (ref 3.5–5.3)
Sodium: 141 mmol/L (ref 135–146)
Total Bilirubin: 0.6 mg/dL (ref 0.2–1.2)
Total Protein: 7.4 g/dL (ref 6.1–8.1)

## 2019-12-28 LAB — PSA: PSA: 1.1 ng/mL (ref <=4.0)

## 2019-12-29 ENCOUNTER — Other Ambulatory Visit: Payer: Self-pay | Admitting: Family Medicine

## 2019-12-30 ENCOUNTER — Other Ambulatory Visit: Payer: Self-pay

## 2019-12-30 ENCOUNTER — Telehealth: Payer: Self-pay

## 2019-12-30 MED ORDER — ROSUVASTATIN CALCIUM 40 MG PO TABS
40.0000 mg | ORAL_TABLET | ORAL | 3 refills | Status: DC
Start: 2019-12-30 — End: 2020-02-29

## 2019-12-30 NOTE — Telephone Encounter (Signed)
Returned pt call and pt states he figured out what the medication was.

## 2019-12-30 NOTE — Telephone Encounter (Signed)
Pt is returning JoEllen's call. He also states that his pharmacy sent him a text saying that a medication that starts with the letter " A" is ready. He mentioned all his meds were ready and wanted to know what that was.

## 2020-02-19 ENCOUNTER — Other Ambulatory Visit: Payer: Self-pay

## 2020-02-19 ENCOUNTER — Encounter (HOSPITAL_COMMUNITY): Payer: Self-pay

## 2020-02-19 ENCOUNTER — Ambulatory Visit (HOSPITAL_COMMUNITY)
Admission: EM | Admit: 2020-02-19 | Discharge: 2020-02-19 | Disposition: A | Discharge status: Home / Self Care | Payer: PRIVATE HEALTH INSURANCE | Attending: Urgent Care | Admitting: Urgent Care

## 2020-02-19 DIAGNOSIS — B349 Viral infection, unspecified: Secondary | ICD-10-CM | POA: Diagnosis present

## 2020-02-19 DIAGNOSIS — R6883 Chills (without fever): Secondary | ICD-10-CM | POA: Insufficient documentation

## 2020-02-19 DIAGNOSIS — U071 COVID-19: Secondary | ICD-10-CM | POA: Diagnosis not present

## 2020-02-19 LAB — SARS CORONAVIRUS 2 (TAT 6-24 HRS): SARS Coronavirus 2: POSITIVE — AB

## 2020-02-19 NOTE — ED Triage Notes (Signed)
Pt c/o chills started last night. Pt denies any other sx.

## 2020-02-19 NOTE — Discharge Instructions (Signed)

## 2020-02-19 NOTE — ED Provider Notes (Signed)
[place]   MRN: [id number] DOB: Feb 07, 1961  Subjective:   Evan Leonard is a 59 y.o. male presenting for acute onset of chills and sweats last night.  Patient would like to make sure he does not have COVID-19.  Denies fever, cough, loss of sense of taste and smell, chest pain, shortness of breath.  No current facility-administered medications for this encounter.  Current Outpatient Medications:  .  ACCU-CHEK GUIDE test strip, USE AS DIRECTED UP TO 4 TIMES DAILY, Disp: 100 strip, Rfl: 3 .  blood glucose meter kit and supplies KIT, Dispense based on patient and insurance preference. Use up to four times daily as directed. E11.65, Disp: 1 each, Rfl: 0 .  ELIQUIS 5 MG TABS tablet, TAKE 1 TABLET BY MOUTH TWICE A DAY, Disp: 60 tablet, Rfl: 5 .  empagliflozin (JARDIANCE) 10 MG TABS tablet, Take by mouth daily. Sample given-#28, Disp: , Rfl:  .  furosemide (LASIX) 40 MG tablet, Take 1 tablet (40 mg total) by mouth daily as needed for fluid or edema. (Patient not taking: Reported on 12/27/2019), Disp: 30 tablet, Rfl: 2 .  metFORMIN (GLUCOPHAGE XR) 500 MG 24 hr tablet, Take 2 tablets (1,000 mg total) by mouth in the morning and at bedtime., Disp: 180 each, Rfl: 3 .  Multiple Vitamin (MULTIVITAMIN WITH MINERALS) TABS tablet, Take 1 tablet by mouth daily. , Disp: , Rfl:  .  nystatin (MYCOSTATIN/NYSTOP) powder, Apply 1 application topically 3 (three) times daily., Disp: 15 g, Rfl: 0 .  nystatin cream (MYCOSTATIN), Apply to affected area 2 times daily, Disp: 30 g, Rfl: 0 .  rosuvastatin (CRESTOR) 40 MG tablet, Take 1 tablet (40 mg total) by mouth once a week., Disp: 13 tablet, Rfl: 3 .  sildenafil (REVATIO) 20 MG tablet, Take 2-5 tablets as needed once every 48 hours for erectile dysfunction, Disp: 30 tablet, Rfl: 3 .  triamcinolone ointment (KENALOG) 0.5 %, Apply 1 application topically 2 (two) times daily. For 7-14 days maximum, Disp: 60 g, Rfl: 1   No Known Allergies  Past  Medical History:  Diagnosis Date  . Diabetes mellitus, type II ([place]) 02/2013  . Diabetic ulcer of right foot due to type 2 diabetes mellitus ([place]) 03/21/2018  . DVT (deep venous thrombosis) ([place]) 05/04/2012   LLE  . Exertional dyspnea 05/04/2012   "isolated episode" (05/05/2012)  . Hepatic steatosis   . History of chickenpox   . Hypertension 03/21/2018  . Obesity, Class III, BMI 40-49.9 (morbid obesity) ([place]) 03/21/2018  . OSA on CPAP   . Peripheral vascular disease (HCC)    varicose veins  . Pulmonary embolism ([place]) 05/04/2012   bilaterally  . Pyelonephritis 04/2017  . Small bowel obstruction ([place]) 09/06/2015  . Varicose veins      Past Surgical History:  Procedure Laterality Date  . CYSTOSCOPY W/ URETERAL STENT PLACEMENT Right 05/12/2017   Procedure: CYSTOSCOPY WITH RETROGRADE PYELOGRAM/URETERAL STENT PLACEMENT;  Surgeon: [name], MD;  Location: [facility];  Service: Urology;  Laterality: Right;  . CYSTOSCOPY/URETEROSCOPY/HOLMIUM LASER/STENT PLACEMENT Right 07/29/2017   Procedure: CYSTOSCOPY RIGHT URETEROSCOPY HOLMIUM LASER STENT EXCHANGE;  Surgeon: [name], MD;  Location: [facility];  Service: Urology;  Laterality: Right;  . HERNIA REPAIR     2015  . LAPAROTOMY N/A 12/13/2013   Procedure: EXPLORATORY LAPAROTOMY Repair ventral hernia, without mesh, partial omentectomy;  Surgeon: Gwenyth Ober, MD;  Location: [facility];  Service: General;  Laterality: N/A;  . VENTRAL HERNIA REPAIR N/A 04/26/2017  Procedure: INCARCERATED HERNIA REPAIR VENTRAL ADULT;  Surgeon: Georganna Skeans, MD;  [facility];  Service: General;  Laterality: N/A;    Family History  Problem Relation Age of Onset  . Diabetes Mother        died age 74- also bleeding ulcer  . Hypertension Mother   . Heart failure Father        in his 61s (patient was 13). day before surgery planned  . Other Maternal Grandmother        states natural causes all grandparents    Social History   Tobacco Use  . Smoking status: Never  Smoker  . Smokeless tobacco: Never Used  Vaping Use  . Vaping Use: Never used  Substance Use Topics  . Alcohol use: No    Alcohol/week: 0.0 standard drinks  . Drug use: No    ROS   Objective:   Vitals: BP 128/68   Pulse 90   Temp 98.8 F (37.1 C) (Oral)   Resp 16   Ht '6\' 2"'  (1.88 m)   Wt (!) 350 lb (158.8 kg)   SpO2 98%   BMI 44.94 kg/m   Physical Exam Constitutional:      General: He is not in acute distress.    Appearance: Normal appearance. He is well-developed. He is not ill-appearing, toxic-appearing or diaphoretic.  HENT:     Head: Normocephalic and atraumatic.     Right Ear: External ear normal.     Left Ear: External ear normal.     Nose: Nose normal.     Mouth/Throat:     Mouth: Mucous membranes are moist.     Pharynx: Oropharynx is clear.  Eyes:     General: No scleral icterus.    Extraocular Movements: Extraocular movements intact.     Pupils: Pupils are equal, round, and reactive to light.  Cardiovascular:     Rate and Rhythm: Normal rate and regular rhythm.     Heart sounds: Normal heart sounds. No murmur heard.  No friction rub. No gallop.   Pulmonary:     Effort: Pulmonary effort is normal. No respiratory distress.     Breath sounds: Normal breath sounds. No stridor. No wheezing, rhonchi or rales.  Neurological:     Mental Status: He is alert and oriented to person, place, and time.  Psychiatric:        Mood and Affect: Mood normal.        Behavior: Behavior normal.        Thought Content: Thought content normal.       Assessment and Plan :   PDMP not reviewed this encounter.  1. Chills   2. Viral syndrome     Will manage for viral illness such as viral syndrome, COVID-19. Counseled patient on nature of COVID-19 including modes of transmission, diagnostic testing, management and supportive care.  Offered scripts for symptomatic relief. COVID 19 testing is pending. Counseled patient on potential for adverse effects with medications  prescribed/recommended today, ER and return-to-clinic precautions discussed, patient verbalized understanding.     Jaynee Eagles, PA-C 02/19/20 1323

## 2020-02-20 ENCOUNTER — Encounter: Payer: Self-pay | Admitting: Physician Assistant

## 2020-02-22 ENCOUNTER — Encounter: Payer: Self-pay | Admitting: Physician Assistant

## 2020-02-22 ENCOUNTER — Other Ambulatory Visit (HOSPITAL_COMMUNITY): Payer: Self-pay | Admitting: Physician Assistant

## 2020-02-22 NOTE — Progress Notes (Signed)
I connected by phone with Evan Leonard on 02/22/2020 at 4:31 PM to discuss the potential use of a new treatment for mild to moderate COVID-19 viral infection in non-hospitalized patients.  This patient is a 59 y.o. male that meets the FDA criteria for Emergency Use Authorization of COVID monoclonal antibody casirivimab/imdevimab or bamlanivimab/eteseviamb.  Has a (+) direct SARS-CoV-2 viral test result  Has mild or moderate COVID-19   Is NOT hospitalized due to COVID-19  Is within 10 days of symptom onset  Has at least one of the high risk factor(s) for progression to severe COVID-19 and/or hospitalization as defined in EUA.  Specific high risk criteria : BMI > 25, Diabetes and Cardiovascular disease or hypertension   I have spoken and communicated the following to the patient or parent/caregiver regarding COVID monoclonal antibody treatment:  1. FDA has authorized the emergency use for the treatment of mild to moderate COVID-19 in adults and pediatric patients with positive results of direct SARS-CoV-2 viral testing who are 81 years of age and older weighing at least 40 kg, and who are at high risk for progressing to severe COVID-19 and/or hospitalization.  2. The significant known and potential risks and benefits of COVID monoclonal antibody, and the extent to which such potential risks and benefits are unknown.  3. Information on available alternative treatments and the risks and benefits of those alternatives, including clinical trials.  4. Patients treated with COVID monoclonal antibody should continue to self-isolate and use infection control measures (e.g., wear mask, isolate, social distance, avoid sharing personal items, clean and disinfect "high touch" surfaces, and frequent handwashing) according to CDC guidelines.   5. The patient or parent/caregiver has the option to accept or refuse COVID monoclonal antibody treatment.  After reviewing this information with the  patient, the patient has agreed to receive one of the available covid 19 monoclonal antibodies and will be provided an appropriate fact sheet prior to infusion. Konrad Felix, PA-C 02/22/2020 4:31 PM

## 2020-02-23 ENCOUNTER — Ambulatory Visit (HOSPITAL_COMMUNITY)
Admission: RE | Admit: 2020-02-23 | Discharge: 2020-02-23 | Disposition: A | Discharge status: Home / Self Care | Payer: PRIVATE HEALTH INSURANCE | Source: Ambulatory Visit | Attending: Pulmonary Disease | Admitting: Pulmonary Disease

## 2020-02-23 DIAGNOSIS — U071 COVID-19: Secondary | ICD-10-CM | POA: Insufficient documentation

## 2020-02-23 MED ORDER — FAMOTIDINE IN NACL 20-0.9 MG/50ML-% IV SOLN: 20.0000 mg | Freq: Once | INTRAVENOUS | Status: DC | PRN

## 2020-02-23 MED ORDER — METHYLPREDNISOLONE SODIUM SUCC 125 MG IJ SOLR: 125.0000 mg | Freq: Once | INTRAMUSCULAR | Status: DC | PRN

## 2020-02-23 MED ORDER — EPINEPHRINE 0.3 MG/0.3ML IJ SOAJ: 0.3000 mg | Freq: Once | INTRAMUSCULAR | Status: DC | PRN

## 2020-02-23 MED ORDER — ALBUTEROL SULFATE HFA 108 (90 BASE) MCG/ACT IN AERS: 2.0000 | INHALATION_SPRAY | Freq: Once | RESPIRATORY_TRACT | Status: DC | PRN

## 2020-02-23 MED ORDER — DIPHENHYDRAMINE HCL 50 MG/ML IJ SOLN: 50.0000 mg | Freq: Once | INTRAMUSCULAR | Status: DC | PRN

## 2020-02-23 MED ORDER — SODIUM CHLORIDE 0.9 % IV SOLN
1200.0000 mg | Freq: Once | INTRAVENOUS | Status: AC
  Administered 2020-02-23: 1200 mg via INTRAVENOUS

## 2020-02-23 MED ORDER — SODIUM CHLORIDE 0.9 % IV SOLN: INTRAVENOUS | Status: DC | PRN

## 2020-02-23 NOTE — Progress Notes (Signed)
  Diagnosis: COVID-19  Physician:Dr Joya Gaskins  Procedure: Covid Infusion Clinic Med: casirivimab\imdevimab infusion - Provided patient with casirivimab\imdevimab fact sheet for patients, parents and caregivers prior to infusion.  Complications: No immediate complications noted.  Discharge: Discharged home   Sherilyn Cooter 02/23/2020

## 2020-02-29 ENCOUNTER — Other Ambulatory Visit: Payer: Self-pay | Admitting: Family Medicine

## 2020-03-01 ENCOUNTER — Encounter: Payer: Self-pay | Admitting: Physician Assistant

## 2020-03-01 ENCOUNTER — Telehealth (INDEPENDENT_AMBULATORY_CARE_PROVIDER_SITE_OTHER): Payer: PRIVATE HEALTH INSURANCE | Admitting: Physician Assistant

## 2020-03-01 DIAGNOSIS — U071 COVID-19: Secondary | ICD-10-CM | POA: Diagnosis not present

## 2020-03-01 DIAGNOSIS — E1165 Type 2 diabetes mellitus with hyperglycemia: Secondary | ICD-10-CM | POA: Diagnosis not present

## 2020-03-01 MED ORDER — AZITHROMYCIN 250 MG PO TABS: ORAL_TABLET | ORAL | 0 refills | Status: DC

## 2020-03-01 MED ORDER — EMPAGLIFLOZIN 10 MG PO TABS: 10.0000 mg | ORAL_TABLET | Freq: Every day | ORAL | 0 refills | Status: DC

## 2020-03-01 NOTE — Progress Notes (Signed)
Virtual Visit via Video   I connected with Evan Leonard on 03/01/20 at  4:00 PM EDT by a video enabled telemedicine application and verified that I am speaking with the correct person using two identifiers. Location patient: Home Location provider: [facility], Office Persons participating in the virtual visit: Ma Hillock, Inda Coke PA-C  I discussed the limitations of evaluation and management by telemedicine and the availability of in person appointments. The patient expressed understanding and agreed to proceed.   Subjective:   HPI:   COVID-19 02/19/20 was diagnosed with COVID-19. 02/22/20 completed antibody infusion.  *He only received 1 COVID-19 vaccine, but was waiting for his blood sugar levels to improve for second dose per PCP note on 12/27/19  Denies any fever, chills, nausea, vomiting, poor appetite, weakness, SOB, chest pain.  He is 10 days past his COVID-19 positive test. He is concerned because he developed a very slight dry cough yesterday and has ongoing fatigue.  He has significant hx of DM. Last A1c quite uncontrolled. Currently on metformin, says he was on a jardiance sample but this ran out. Blood sugars are around 115 in the morning. Denies frequency of urination or thirst.  Has significant hx of PE. He is diligent with taking his eliquis 5 mg BID.   ROS: See pertinent positives and negatives per HPI.  Patient Active Problem List   Diagnosis Date Noted  . Eczema 12/27/2019  . Erectile dysfunction 12/27/2019  . SBO (small bowel obstruction) ([place]) 08/20/2018  . Diabetic ulcer of right foot due to type 2 diabetes mellitus ([place]) 03/21/2018  . Atypical chest pain 02/09/2018  . Renal stone 05/12/2017  . S/P repair of recurrent ventral hernia 04/26/2017  . Chronic venous insufficiency 01/25/2015  . Diabetes mellitus with hyperglycemia ([place]) 03/31/2013  . Hyponatremia 03/23/2013  . Pulmonary embolism ([name]) 05/05/2012  . Morbid obesity with  body mass index of 40.0-44.9 in adult [facility]) 05/05/2012  . Obstructive sleep apnea 05/05/2012  . DVT of left distal popliteal vein 05/05/2012    Social History   Tobacco Use  . Smoking status: Never Smoker  . Smokeless tobacco: Never Used  Substance Use Topics  . Alcohol use: No    Alcohol/week: 0.0 standard drinks    Current Outpatient Medications:  .  ACCU-CHEK GUIDE test strip, USE AS DIRECTED UP TO 4 TIMES DAILY, Disp: 100 strip, Rfl: 3 .  azithromycin (ZITHROMAX) 250 MG tablet, Take two tablets on day 1, then one daily x 4 days, Disp: 6 tablet, Rfl: 0 .  blood glucose meter kit and supplies KIT, Dispense based on patient and insurance preference. Use up to four times daily as directed. E11.65, Disp: 1 each, Rfl: 0 .  ELIQUIS 5 MG TABS tablet, TAKE 1 TABLET BY MOUTH TWICE A DAY, Disp: 60 tablet, Rfl: 5 .  empagliflozin (JARDIANCE) 10 MG TABS tablet, Take 1 tablet (10 mg total) by mouth daily., Disp: 90 tablet, Rfl: 0 .  furosemide (LASIX) 40 MG tablet, Take 1 tablet (40 mg total) by mouth daily as needed for fluid or edema. (Patient not taking: Reported on 12/27/2019), Disp: 30 tablet, Rfl: 2 .  metFORMIN (GLUCOPHAGE XR) 500 MG 24 hr tablet, Take 2 tablets (1,000 mg total) by mouth in the morning and at bedtime., Disp: 180 each, Rfl: 3 .  Multiple Vitamin (MULTIVITAMIN WITH MINERALS) TABS tablet, Take 1 tablet by mouth daily. , Disp: , Rfl:  .  nystatin (MYCOSTATIN/NYSTOP) powder, Apply 1 application topically 3 (three)  times daily., Disp: 15 g, Rfl: 0 .  nystatin cream (MYCOSTATIN), Apply to affected area 2 times daily, Disp: 30 g, Rfl: 0 .  rosuvastatin (CRESTOR) 40 MG tablet, TAKE 1 TABLET (40 MG TOTAL) BY MOUTH ONCE A WEEK., Disp: 12 tablet, Rfl: 4 .  sildenafil (REVATIO) 20 MG tablet, Take 2-5 tablets as needed once every 48 hours for erectile dysfunction, Disp: 30 tablet, Rfl: 3 .  triamcinolone ointment (KENALOG) 0.5 %, Apply 1 application topically 2 (two) times daily. For 7-14  days maximum, Disp: 60 g, Rfl: 1  No Known Allergies  Objective:   VITALS: Per patient if applicable, see vitals. GENERAL: Alert, appears well and in no acute distress. HEENT: Atraumatic, conjunctiva clear, no obvious abnormalities on inspection of external nose and ears. NECK: Normal movements of the head and neck. CARDIOPULMONARY: No increased WOB. Speaking in clear sentences. I:E ratio WNL.  MS: Moves all visible extremities without noticeable abnormality. PSYCH: Pleasant and cooperative, well-groomed. Speech normal rate and rhythm. Affect is appropriate. Insight and judgement are appropriate. Attention is focused, linear, and appropriate.  NEURO: CN grossly intact. Oriented as arrived to appointment on time with no prompting. Moves both UE equally.  SKIN: No obvious lesions, wounds, erythema, or cyanosis noted on face or hands.  Assessment and Plan:   Diagnoses and all orders for this visit:  COVID-19 Patient has a respiratory illness without signs of acute distress or respiratory compromise at this time.  No red flags on discussion.  Appointment made for our office on Monday, October 11th at 4p to come in office (this will be 2 weeks after COVID + test.) Will assess vitals and update labs at that time.  Consider CXR in the interim, his cough is not severe, so we decided to wait until Monday to determine if that is needed. Provided pocket rx of Azithromycin should his cough worsen. Worsening red flags extensively reviewed, and advised if they experience a "second sickening" or worsening symptoms as the illness progresses, they are to call the office for further instructions or seek emergent evaluation for any severe symptoms.   DM2 Needs refill on Jardiance. I'm leaving him two boxes (two weeks worth) of Jardiance 10 mg at front desk.  Will refill Jardiance for patient. Further mgmt per PCP, has appt scheduled next month.  Other orders -     empagliflozin (JARDIANCE) 10 MG TABS  tablet; Take 1 tablet (10 mg total) by mouth daily. -     azithromycin (ZITHROMAX) 250 MG tablet; Take two tablets on day 1, then one daily x 4 days   I discussed the assessment and treatment plan with the patient. The patient was provided an opportunity to ask questions and all were answered. The patient agreed with the plan and demonstrated an understanding of the instructions.   The patient was advised to call back or seek an in-person evaluation if the symptoms worsen or if the condition fails to improve as anticipated.   CMA or LPN served as scribe during this visit. History, Physical, and Plan performed by medical provider. The above documentation has been reviewed and is accurate and complete.  [place], [place] 03/01/2020

## 2020-03-06 ENCOUNTER — Other Ambulatory Visit: Payer: Self-pay

## 2020-03-06 ENCOUNTER — Encounter: Payer: Self-pay | Admitting: Physician Assistant

## 2020-03-06 ENCOUNTER — Ambulatory Visit: Payer: PRIVATE HEALTH INSURANCE | Admitting: Physician Assistant

## 2020-03-06 VITALS — BP 120/80 | HR 109 | Temp 98.6°F | Ht 74.0 in | Wt 322.2 lb

## 2020-03-06 DIAGNOSIS — U071 COVID-19: Secondary | ICD-10-CM | POA: Diagnosis not present

## 2020-03-06 DIAGNOSIS — R8290 Unspecified abnormal findings in urine: Secondary | ICD-10-CM

## 2020-03-06 LAB — CBC WITH DIFFERENTIAL/PLATELET
Absolute Monocytes: 817 cells/uL (ref 200–950)
Basophils Absolute: 48 cells/uL (ref 0–200)
Basophils Relative: 0.5 %
Eosinophils Absolute: 219 cells/uL (ref 15–500)
Eosinophils Relative: 2.3 %
HCT: 43.6 % (ref 38.5–50.0)
Hemoglobin: 14.4 g/dL (ref 13.2–17.1)
Lymphs Abs: 2261 cells/uL (ref 850–3900)
MCH: 29.2 pg (ref 27.0–33.0)
MCHC: 33 g/dL (ref 32.0–36.0)
MCV: 88.4 fL (ref 80.0–100.0)
MPV: 10.7 fL (ref 7.5–12.5)
Monocytes Relative: 8.6 %
Neutro Abs: 6156 cells/uL (ref 1500–7800)
Neutrophils Relative %: 64.8 %
Platelets: 258 10*3/uL (ref 140–400)
RBC: 4.93 10*6/uL (ref 4.20–5.80)
RDW: 13.3 % (ref 11.0–15.0)
Total Lymphocyte: 23.8 %
WBC: 9.5 10*3/uL (ref 3.8–10.8)

## 2020-03-06 LAB — COMPREHENSIVE METABOLIC PANEL
AG Ratio: 1.1 (calc) (ref 1.0–2.5)
ALT: 19 U/L (ref 9–46)
AST: 17 U/L (ref 10–35)
Albumin: 3.9 g/dL (ref 3.6–5.1)
Alkaline phosphatase (APISO): 71 U/L (ref 35–144)
BUN: 20 mg/dL (ref 7–25)
CO2: 23 mmol/L (ref 20–32)
Calcium: 9.4 mg/dL (ref 8.6–10.3)
Chloride: 106 mmol/L (ref 98–110)
Creat: 0.94 mg/dL (ref 0.70–1.25)
Globulin: 3.6 g/dL (calc) (ref 1.9–3.7)
Glucose, Bld: 112 mg/dL — ABNORMAL HIGH (ref 65–99)
Potassium: 4.3 mmol/L (ref 3.5–5.3)
Sodium: 139 mmol/L (ref 135–146)
Total Bilirubin: 0.7 mg/dL (ref 0.2–1.2)
Total Protein: 7.5 g/dL (ref 6.1–8.1)

## 2020-03-06 MED ORDER — CEPHALEXIN 500 MG PO CAPS: 500.0000 mg | ORAL_CAPSULE | Freq: Two times a day (BID) | ORAL | 0 refills | Status: AC

## 2020-03-06 NOTE — Progress Notes (Signed)
Evan Leonard is a 59 y.o. male is here for follow up.  I acted as a Education administrator for [employer], PA-C Anselmo Pickler, LPN   History of Present Illness:   Chief Complaint  Patient presents with  . f/u + COVID    9/25    HPI   F/u + COVID Pt is here for follow up, he is 2 weeks out from + COVID 9/25. We did a virtual visit together on 03/01/20 -- see note for more details. During that visit I gave him a z-pack safety net for his cough, he did not take this, did not feel like he needed it. He denies: SOB, chest pain, LE swelling He vacuumed his house over the weekend and did not have any significant symptoms -- denies SOB, CP with activity. Has normal appetite. Only concern is very occasional dry cough and fatigue.  Malodorous urine Patient reports that his urine has had bad smell over the past few days. Denies: pain, nausea, vomiting, blood in urination, fever, chills, malaise  Endorses hx of frequent UTI. He is a poorly controlled diabetic.    Health Maintenance Due  Topic Date Due  . COLONOSCOPY  Never done  . OPHTHALMOLOGY EXAM  04/26/2014  . COVID-19 Vaccine (2 - Pfizer 2-dose series) 11/14/2019  . INFLUENZA VACCINE  12/26/2019    Past Medical History:  Diagnosis Date  . Diabetes mellitus, type II ([place]) 02/2013  . Diabetic ulcer of right foot due to type 2 diabetes mellitus ([place]) 03/21/2018  . DVT (deep venous thrombosis) ([place]) 05/04/2012   LLE  . Exertional dyspnea 05/04/2012   "isolated episode" (05/05/2012)  . Hepatic steatosis   . History of chickenpox   . Hypertension 03/21/2018  . Obesity, Class III, BMI 40-49.9 (morbid obesity) ([place]) 03/21/2018  . OSA on CPAP   . Peripheral vascular disease (HCC)    varicose veins  . Pulmonary embolism ([place]) 05/04/2012   bilaterally  . Pyelonephritis 04/2017  . Small bowel obstruction ([place]) 09/06/2015  . Varicose veins      Social History   Tobacco Use  . Smoking status: Never Smoker  . Smokeless tobacco:  Never Used  Vaping Use  . Vaping Use: Never used  Substance Use Topics  . Alcohol use: No    Alcohol/week: 0.0 standard drinks  . Drug use: No    Past Surgical History:  Procedure Laterality Date  . CYSTOSCOPY W/ URETERAL STENT PLACEMENT Right 05/12/2017   Procedure: CYSTOSCOPY WITH RETROGRADE PYELOGRAM/URETERAL STENT PLACEMENT;  Surgeon: [name], MD;  Location: [facility];  Service: Urology;  Laterality: Right;  . CYSTOSCOPY/URETEROSCOPY/HOLMIUM LASER/STENT PLACEMENT Right 07/29/2017   Procedure: CYSTOSCOPY RIGHT URETEROSCOPY HOLMIUM LASER STENT EXCHANGE;  Surgeon: [name], MD;  [facility];  Service: Urology;  Laterality: Right;  . HERNIA REPAIR     2015  . LAPAROTOMY N/A 12/13/2013   Procedure: EXPLORATORY LAPAROTOMY Repair ventral hernia, without mesh, partial omentectomy;  Surgeon: Gwenyth Ober, MD;  Location: [facility];  Service: General;  Laterality: N/A;  . VENTRAL HERNIA REPAIR N/A 04/26/2017   Procedure: INCARCERATED HERNIA REPAIR VENTRAL ADULT;  Surgeon: Georganna Skeans, MD;  Location: [facility];  Service: General;  Laterality: N/A;    Family History  Problem Relation Age of Onset  . Diabetes Mother        died age 69- also bleeding ulcer  . Hypertension Mother   . Heart failure Father        in his 53s (patient was  16). day before surgery planned  . Other Maternal Grandmother        states natural causes all grandparents    PMHx, SurgHx, SocialHx, FamHx, Medications, and Allergies were reviewed in the Visit Navigator and updated as appropriate.   Patient Active Problem List   Diagnosis Date Noted  . Eczema 12/27/2019  . Erectile dysfunction 12/27/2019  . SBO (small bowel obstruction) (HCC) 08/20/2018  . Diabetic ulcer of right foot due to type 2 diabetes mellitus (HCC) 03/21/2018  . Atypical chest pain 02/09/2018  . Renal stone 05/12/2017  . S/P repair of recurrent ventral hernia 04/26/2017  . Chronic venous insufficiency 01/25/2015  . Diabetes mellitus with  hyperglycemia (HCC) 03/31/2013  . Hyponatremia 03/23/2013  . Pulmonary embolism (HCC) 05/05/2012  . Morbid obesity with body mass index of 40.0-44.9 in adult [facility]) 05/05/2012  . Obstructive sleep apnea 05/05/2012  . DVT of left distal popliteal vein 05/05/2012    Social History   Tobacco Use  . Smoking status: Never Smoker  . Smokeless tobacco: Never Used  Vaping Use  . Vaping Use: Never used  Substance Use Topics  . Alcohol use: No    Alcohol/week: 0.0 standard drinks  . Drug use: No    Current Medications and Allergies:    Current Outpatient Medications:  .  ACCU-CHEK GUIDE test strip, USE AS DIRECTED UP TO 4 TIMES DAILY, Disp: 100 strip, Rfl: 3 .  blood glucose meter kit and supplies KIT, Dispense based on patient and insurance preference. Use up to four times daily as directed. E11.65, Disp: 1 each, Rfl: 0 .  ELIQUIS 5 MG TABS tablet, TAKE 1 TABLET BY MOUTH TWICE A DAY, Disp: 60 tablet, Rfl: 5 .  empagliflozin (JARDIANCE) 10 MG TABS tablet, Take 1 tablet (10 mg total) by mouth daily., Disp: 90 tablet, Rfl: 0 .  furosemide (LASIX) 40 MG tablet, Take 1 tablet (40 mg total) by mouth daily as needed for fluid or edema., Disp: 30 tablet, Rfl: 2 .  metFORMIN (GLUCOPHAGE XR) 500 MG 24 hr tablet, Take 2 tablets (1,000 mg total) by mouth in the morning and at bedtime., Disp: 180 each, Rfl: 3 .  Multiple Vitamin (MULTIVITAMIN WITH MINERALS) TABS tablet, Take 1 tablet by mouth daily. , Disp: , Rfl:  .  rosuvastatin (CRESTOR) 40 MG tablet, TAKE 1 TABLET (40 MG TOTAL) BY MOUTH ONCE A WEEK., Disp: 12 tablet, Rfl: 4 .  sildenafil (REVATIO) 20 MG tablet, Take 2-5 tablets as needed once every 48 hours for erectile dysfunction, Disp: 30 tablet, Rfl: 3 .  triamcinolone ointment (KENALOG) 0.5 %, Apply 1 application topically 2 (two) times daily. For 7-14 days maximum, Disp: 60 g, Rfl: 1 .  azithromycin (ZITHROMAX) 250 MG tablet, Take two tablets on day 1, then one daily x 4 days (Patient not  taking: Reported on 03/06/2020), Disp: 6 tablet, Rfl: 0 .  cephALEXin (KEFLEX) 500 MG capsule, Take 1 capsule (500 mg total) by mouth 2 (two) times daily for 7 days., Disp: 14 capsule, Rfl: 0  No Known Allergies  Review of Systems   ROS  Negative unless otherwise specified per HPI.  Vitals:   Vitals:   03/06/20 1607  BP: 120/80  Pulse: (!) 109  Temp: 98.6 F (37 C)  TempSrc: Temporal  SpO2: 96%  Weight: (!) 322 lb 4 oz (146.2 kg)  Height: 6\' 2"  (1.88 m)     Body mass index is 41.37 kg/m.   Physical Exam:    Physical Exam  Vitals and nursing note reviewed.  Constitutional:      General: He is not in acute distress.    Appearance: He is well-developed. He is not ill-appearing or toxic-appearing.  Cardiovascular:     Rate and Rhythm: Regular rhythm. Tachycardia present.     Pulses: Normal pulses.     Heart sounds: Normal heart sounds, S1 normal and S2 normal.     Comments: No LE edema Pulmonary:     Effort: Pulmonary effort is normal.     Breath sounds: Normal breath sounds.  Skin:    General: Skin is warm and dry.  Neurological:     Mental Status: He is alert.     GCS: GCS eye subscore is 4. GCS verbal subscore is 5. GCS motor subscore is 6.  Psychiatric:        Speech: Speech normal.        Behavior: Behavior normal. Behavior is cooperative.      Assessment and Plan:    Anoop was seen today for f/u + covid.  Diagnoses and all orders for this visit:  COVID-19 I think that it is reasonable for him to return to work. He does have the ability to sit on a stool throughout the day if needed. He would like to try to return full-time and let us know if he needs documentation otherwise. NAD today and no signs of WOB. He is planning to get his flu shot soon, and is going to complete his COVID-19 vaccination after 90 day time frame. Discussed symptoms of long-haul covid and encouraged regular ambulation as tolerated and that fatigue may last up to 12  weeks. Encouraged healthy nutrition and better mgmt of blood sugars as able. If any symptoms worsen, close follow-up -- instructed not to delay care. -     CBC with Differential/Platelet; Future -     Comprehensive metabolic panel; Future -     CBC with Differential/Platelet -     Comprehensive metabolic panel  Abnormal urine odor No red flags on exam. Start empiric keflex and order urine culture for further evaluation and management. Will reach out to patient on culture results and adjust plan if needed. -     Urine Culture  Other orders -     cephALEXin (KEFLEX) 500 MG capsule; Take 1 capsule (500 mg total) by mouth 2 (two) times daily for 7 days.    CMA or LPN served as scribe during this visit. History, Physical, and Plan performed by medical provider. The above documentation has been reviewed and is accurate and complete.   Inda Coke, PA-C [place] [place] 03/06/2020  Follow-up: No follow-ups on file.

## 2020-03-06 NOTE — Patient Instructions (Signed)
It was great to see you!  Okay to return to work but if you feel overly fatigued or develop any significant lower leg swelling, chest pain, shortness of breath, palpitations or other concerns, please let us know and we will work on further evaluating you and reducing your work schedule.  Follow-up with Dr. Yong Channel in November as scheduled.  Take care,  Inda Coke PA-C

## 2020-03-08 LAB — URINE CULTURE
MICRO NUMBER:: 11055912
SPECIMEN QUALITY:: ADEQUATE

## 2020-03-24 NOTE — Progress Notes (Deleted)
Phone [phone] In person visit   Subjective:   Evan Leonard is a 59 y.o. year old very pleasant male patient who presents for/with See problem oriented charting No chief complaint on file.   This visit occurred during the SARS-CoV-2 public health emergency.  Safety protocols were in place, including screening questions prior to the visit, additional usage of staff PPE, and extensive cleaning of exam room while observing appropriate contact time as indicated for disinfecting solutions.   Past Medical History-  Patient Active Problem List   Diagnosis Date Noted  . Eczema 12/27/2019  . Erectile dysfunction 12/27/2019  . SBO (small bowel obstruction) ([place]) 08/20/2018  . Diabetic ulcer of right foot due to type 2 diabetes mellitus ([place]) 03/21/2018  . Atypical chest pain 02/09/2018  . Renal stone 05/12/2017  . S/P repair of recurrent ventral hernia 04/26/2017  . Chronic venous insufficiency 01/25/2015  . Diabetes mellitus with hyperglycemia ([place]) 03/31/2013  . Hyponatremia 03/23/2013  . Pulmonary embolism ([place]) 05/05/2012  . Morbid obesity with body mass index of 40.0-44.9 in adult [facility]) 05/05/2012  . Obstructive sleep apnea 05/05/2012  . DVT of left distal popliteal vein 05/05/2012    Medications- reviewed and updated Current Outpatient Medications  Medication Sig Dispense Refill  . ACCU-CHEK GUIDE test strip USE AS DIRECTED UP TO 4 TIMES DAILY 100 strip 3  . azithromycin (ZITHROMAX) 250 MG tablet Take two tablets on day 1, then one daily x 4 days (Patient not taking: Reported on 03/06/2020) 6 tablet 0  . blood glucose meter kit and supplies KIT Dispense based on patient and insurance preference. Use up to four times daily as directed. E11.65 1 each 0  . ELIQUIS 5 MG TABS tablet TAKE 1 TABLET BY MOUTH TWICE A DAY 60 tablet 5  . empagliflozin (JARDIANCE) 10 MG TABS tablet Take 1 tablet (10 mg total) by mouth daily. 90 tablet 0  . furosemide (LASIX) 40 MG tablet Take 1  tablet (40 mg total) by mouth daily as needed for fluid or edema. 30 tablet 2  . metFORMIN (GLUCOPHAGE XR) 500 MG 24 hr tablet Take 2 tablets (1,000 mg total) by mouth in the morning and at bedtime. 180 each 3  . Multiple Vitamin (MULTIVITAMIN WITH MINERALS) TABS tablet Take 1 tablet by mouth daily.     . rosuvastatin (CRESTOR) 40 MG tablet TAKE 1 TABLET (40 MG TOTAL) BY MOUTH ONCE A WEEK. 12 tablet 4  . sildenafil (REVATIO) 20 MG tablet Take 2-5 tablets as needed once every 48 hours for erectile dysfunction 30 tablet 3  . triamcinolone ointment (KENALOG) 0.5 % Apply 1 application topically 2 (two) times daily. For 7-14 days maximum 60 g 1   No current facility-administered medications for this visit.     Objective:  There were no vitals taken for this visit. Gen: NAD, resting comfortably CV: RRR no murmurs rubs or gallops Lungs: CTAB no crackles, wheeze, rhonchi Abdomen: soft/nontender/nondistended/normal bowel sounds. No rebound or guarding.  Ext: no edema Skin: warm, dry Neuro: grossly normal, moves all extremities  ***    Assessment and Plan   # Diabetes S: Medication: Metformin 500Mg , Jardiance 10Mg  CBGs- *** Exercise and diet- *** Lab Results  Component Value Date   HGBA1C 12.8 (A) 12/09/2019   HGBA1C 8.9 (H) 08/22/2019   HGBA1C 7.0 (H) 03/21/2018    A/P: ***   wants shingrix and flu *** 12/27/19 cpe ***  No problem-specific Assessment & Plan notes found for this encounter.   Recommended  follow up: ***No follow-ups on file. Future Appointments  Date Time Provider [place]  03/30/2020  9:20 AM [name], MD [facility]    Lab/Order associations: No diagnosis found.  No orders of the defined types were placed in this encounter.   Time Spent: *** minutes of total time (9:04 PM***- 9:04 PM***) was spent on the date of the encounter performing the following actions: chart review prior to seeing the patient, obtaining history, performing a  medically necessary exam, counseling on the treatment plan, placing orders, and documenting in our EHR.   Return precautions advised.  Clyde Lundborg, CMA

## 2020-03-27 ENCOUNTER — Ambulatory Visit: Payer: PRIVATE HEALTH INSURANCE | Admitting: Family Medicine

## 2020-03-30 ENCOUNTER — Ambulatory Visit: Payer: PRIVATE HEALTH INSURANCE | Admitting: Family Medicine

## 2020-03-30 DIAGNOSIS — E1165 Type 2 diabetes mellitus with hyperglycemia: Secondary | ICD-10-CM

## 2020-05-04 ENCOUNTER — Ambulatory Visit: Payer: PRIVATE HEALTH INSURANCE | Admitting: Family Medicine

## 2020-06-01 ENCOUNTER — Ambulatory Visit: Payer: PRIVATE HEALTH INSURANCE | Admitting: Family Medicine

## 2020-06-30 ENCOUNTER — Ambulatory Visit: Payer: PRIVATE HEALTH INSURANCE | Admitting: Family Medicine

## 2020-07-20 ENCOUNTER — Ambulatory Visit: Payer: PRIVATE HEALTH INSURANCE | Admitting: Family Medicine

## 2020-07-20 ENCOUNTER — Encounter: Payer: Self-pay | Admitting: Family Medicine

## 2020-07-20 ENCOUNTER — Other Ambulatory Visit: Payer: Self-pay

## 2020-07-20 VITALS — BP 116/86 | HR 93 | Temp 97.9°F | Ht 74.0 in | Wt 348.8 lb

## 2020-07-20 DIAGNOSIS — Z1211 Encounter for screening for malignant neoplasm of colon: Secondary | ICD-10-CM

## 2020-07-20 DIAGNOSIS — I2699 Other pulmonary embolism without acute cor pulmonale: Secondary | ICD-10-CM

## 2020-07-20 DIAGNOSIS — E1165 Type 2 diabetes mellitus with hyperglycemia: Secondary | ICD-10-CM

## 2020-07-20 DIAGNOSIS — I82432 Acute embolism and thrombosis of left popliteal vein: Secondary | ICD-10-CM

## 2020-07-20 DIAGNOSIS — E1169 Type 2 diabetes mellitus with other specified complication: Secondary | ICD-10-CM

## 2020-07-20 DIAGNOSIS — E785 Hyperlipidemia, unspecified: Secondary | ICD-10-CM

## 2020-07-20 DIAGNOSIS — Z6841 Body Mass Index (BMI) 40.0 and over, adult: Secondary | ICD-10-CM

## 2020-07-20 DIAGNOSIS — R5383 Other fatigue: Secondary | ICD-10-CM | POA: Diagnosis not present

## 2020-07-20 NOTE — Progress Notes (Signed)
Phone [phone] In person visit   Subjective:   Evan Leonard is a 60 y.o. year old very pleasant male patient who presents for/with See problem oriented charting Chief Complaint  Patient presents with  . Diabetes    This visit occurred during the SARS-CoV-2 public health emergency.  Safety protocols were in place, including screening questions prior to the visit, additional usage of staff PPE, and extensive cleaning of exam room while observing appropriate contact time as indicated for disinfecting solutions.   Past Medical History-  Patient Active Problem List   Diagnosis Date Noted  . Atypical chest pain 02/09/2018    Priority: High  . Diabetes mellitus with hyperglycemia ([place]) 03/31/2013    Priority: High  . Pulmonary embolism ([place]) 05/05/2012    Priority: High  . Morbid obesity with body mass index of 40.0-44.9 in adult [facility]) 05/05/2012    Priority: High  . DVT of left distal popliteal vein 05/05/2012    Priority: High  . S/P repair of recurrent ventral hernia 04/26/2017    Priority: Medium  . Chronic venous insufficiency 01/25/2015    Priority: Medium  . Obstructive sleep apnea 05/05/2012    Priority: Medium  . Renal stone 05/12/2017    Priority: Low  . Hyponatremia 03/23/2013    Priority: Low  . Eczema 12/27/2019  . Erectile dysfunction 12/27/2019  . SBO (small bowel obstruction) ([place]) 08/20/2018  . Diabetic ulcer of right foot due to type 2 diabetes mellitus ([place]) 03/21/2018    Medications- reviewed and updated Current Outpatient Medications  Medication Sig Dispense Refill  . ACCU-CHEK GUIDE test strip USE AS DIRECTED UP TO 4 TIMES DAILY 100 strip 3  . blood glucose meter kit and supplies KIT Dispense based on patient and insurance preference. Use up to four times daily as directed. E11.65 1 each 0  . ELIQUIS 5 MG TABS tablet TAKE 1 TABLET BY MOUTH TWICE A DAY 60 tablet 5  . empagliflozin (JARDIANCE) 10 MG TABS tablet Take 1 tablet (10 mg total) by  mouth daily. 90 tablet 0  . furosemide (LASIX) 40 MG tablet Take 1 tablet (40 mg total) by mouth daily as needed for fluid or edema. 30 tablet 2  . metFORMIN (GLUCOPHAGE XR) 500 MG 24 hr tablet Take 2 tablets (1,000 mg total) by mouth in the morning and at bedtime. 180 each 3  . Multiple Vitamin (MULTIVITAMIN WITH MINERALS) TABS tablet Take 1 tablet by mouth daily.     . rosuvastatin (CRESTOR) 40 MG tablet TAKE 1 TABLET (40 MG TOTAL) BY MOUTH ONCE A WEEK. 12 tablet 4  . sildenafil (REVATIO) 20 MG tablet Take 2-5 tablets as needed once every 48 hours for erectile dysfunction 30 tablet 3  . triamcinolone ointment (KENALOG) 0.5 % Apply 1 application topically 2 (two) times daily. For 7-14 days maximum 60 g 1   No current facility-administered medications for this visit.     Objective:  BP 116/86   Pulse 93   Temp 97.9 F (36.6 C) (Temporal)   Ht 6\' 2"  (1.88 m)   Wt (!) 348 lb 12.8 oz (158.2 kg)   SpO2 96%   BMI 44.78 kg/m  Gen: NAD, resting comfortably CV: RRR no murmurs rubs or gallops Lungs: CTAB no crackles, wheeze, rhonchi Ext: 1+ edema under compression      Assessment and Plan   # Diabetes #Morbid obesity S: Medication: Metformin 1000MG  XR, Jardiance 10Mg  (has been cost prohibitive unless gets samples- we have been able to  give most of the itme) CBGs-  105 this morning Exercise and diet- working 60 hours a week recently making exercise very challenging. Weight up 26 lbs. Stress eating and grabbing and going.  Lab Results  Component Value Date   HGBA1C 12.8 (A) 12/09/2019   HGBA1C 8.9 (H) 08/22/2019   HGBA1C 7.0 (H) 03/21/2018  A/P: Very poor control per last CBG [place] August 2021 visit blood sugars have drastically improved-this morning was at 105-hopeful that A1c is consistent with excellent home readings-update A1c with labs today -I am discouraged by weight gain but patient has had some serious barriers particularly with his heavy work weeks-encouraged need for  healthy eating, regular exercise, weight loss.    #Chronic pulmonary embolism/chronic DVT S: Patient remains on Eliquis 5 mg twice daily due to history of recurrent DVT A/P:  Stable. Continue current medications.     #hyperlipidemia S: Medication: Rosuvastatin 40 mg once a week Lab Results  Component Value Date   CHOL 196 12/27/2019   HDL 36 (L) 12/27/2019   LDLCALC 131 (H) 12/27/2019   TRIG 155 (H) 12/27/2019   CHOLHDL 5.4 (H) 12/27/2019   A/P: Hoping for some improvement in LDL-update direct LDL with labs today   #Patient reports fatigue since having to work 60 hours on a regular basis-I think very high probability that this is related to work but we will check blood work today as below.  After visit thought about checking B12 with long-term Metformin use -we will consider if persistent issues at follow-up  Recommended follow up: Return in about 3 months (around 10/19/2020) for follow up- or sooner if needed. Future Appointments  Date Time Provider [place]  10/26/2020  4:00 PM [name], MD [facility]    Lab/Order associations:   ICD-10-CM   1. Type 2 diabetes mellitus with hyperglycemia, without long-term current use of insulin (HCC)  E11.65 Hemoglobin A1c    Comprehensive metabolic panel  2. Encounter for screening colonoscopy  Z12.11 Cologuard  3. Screening for colon cancer  Z12.11 Cologuard  4. Hyperlipidemia associated with type 2 diabetes mellitus (HCC)  E11.69 Comprehensive metabolic panel   Y50.3 LDL cholesterol, direct  5. Fatigue, unspecified type  R53.83 CBC with Differential/Platelet    TSH  6. Other pulmonary embolism without acute cor pulmonale, unspecified chronicity (HCC) Chronic I26.99   7. Morbid obesity with body mass index of 40.0-44.9 in adult [facility]) Chronic E66.01    Z68.41   8. Deep vein thrombosis (DVT) of popliteal vein of left lower extremity, unspecified chronicity (HCC)  I82.432     Return precautions advised.  Garret Reddish,  MD

## 2020-07-20 NOTE — Patient Instructions (Addendum)
Please stop by lab before you go If you have mychart- we will send your results within 3 business days of [place] receiving them.  If you do not have mychart- we will call you about results within 5 business days of [place] receiving them.  *please also note that you will see labs on mychart as soon as they post. I will later go in and write notes on them- will say "notes from Dr. Yong Channel"  Team if possible give jardiance samples- if we are out have admin call to get [place] set back up.   Health Maintenance Due  Topic Date Due  . COLONOSCOPY - cologuard instead- let us know if you dont receive your kit within 3 weeks Never done  . OPHTHALMOLOGY EXAM Needs to schedule appointment.  04/26/2014  . COVID-19 Vaccine - reasonable to get this now   11/14/2019    Recommended follow up: Return in about 3 months (around 10/19/2020) for follow up- or sooner if needed.

## 2020-07-21 LAB — CBC WITH DIFFERENTIAL/PLATELET
Basophils Absolute: 0.1 10*3/uL (ref 0.0–0.1)
Basophils Relative: 0.9 % (ref 0.0–3.0)
Eosinophils Absolute: 0.3 10*3/uL (ref 0.0–0.7)
Eosinophils Relative: 3.5 % (ref 0.0–5.0)
HCT: 43.3 % (ref 39.0–52.0)
Hemoglobin: 14.6 g/dL (ref 13.0–17.0)
Lymphocytes Relative: 29.1 % (ref 12.0–46.0)
Lymphs Abs: 2.8 10*3/uL (ref 0.7–4.0)
MCHC: 33.7 g/dL (ref 30.0–36.0)
MCV: 89.6 fl (ref 78.0–100.0)
Monocytes Absolute: 0.7 10*3/uL (ref 0.1–1.0)
Monocytes Relative: 7.3 % (ref 3.0–12.0)
Neutro Abs: 5.6 10*3/uL (ref 1.4–7.7)
Neutrophils Relative %: 59.2 % (ref 43.0–77.0)
Platelets: 193 10*3/uL (ref 150.0–400.0)
RBC: 4.83 Mil/uL (ref 4.22–5.81)
RDW: 14.9 % (ref 11.5–15.5)
WBC: 9.5 10*3/uL (ref 4.0–10.5)

## 2020-07-21 LAB — COMPREHENSIVE METABOLIC PANEL
ALT: 23 U/L (ref 0–53)
AST: 19 U/L (ref 0–37)
Albumin: 4 g/dL (ref 3.5–5.2)
Alkaline Phosphatase: 52 U/L (ref 39–117)
BUN: 20 mg/dL (ref 6–23)
CO2: 28 mEq/L (ref 19–32)
Calcium: 9.5 mg/dL (ref 8.4–10.5)
Chloride: 103 mEq/L (ref 96–112)
Creatinine, Ser: 1.17 mg/dL (ref 0.40–1.50)
GFR: 67.43 mL/min (ref >60.00)
Glucose, Bld: 101 mg/dL — ABNORMAL HIGH (ref 70–99)
Potassium: 4.6 mEq/L (ref 3.5–5.1)
Sodium: 139 mEq/L (ref 135–145)
Total Bilirubin: 0.9 mg/dL (ref 0.2–1.2)
Total Protein: 7.7 g/dL (ref 6.0–8.3)

## 2020-07-21 LAB — LDL CHOLESTEROL, DIRECT: Direct LDL: 128 mg/dL

## 2020-07-21 LAB — HEMOGLOBIN A1C: Hgb A1c MFr Bld: 8.3 % — ABNORMAL HIGH (ref 4.6–6.5)

## 2020-07-21 LAB — TSH: TSH: 0.86 u[IU]/mL (ref 0.35–4.50)

## 2020-07-24 ENCOUNTER — Other Ambulatory Visit: Payer: Self-pay

## 2020-07-24 MED ORDER — ROSUVASTATIN CALCIUM 40 MG PO TABS
40.0000 mg | ORAL_TABLET | ORAL | 3 refills | Status: DC
Start: 2020-07-24 — End: 2021-01-03

## 2020-07-25 ENCOUNTER — Other Ambulatory Visit: Payer: Self-pay | Admitting: Family Medicine

## 2020-07-25 MED ORDER — OZEMPIC (0.25 OR 0.5 MG/DOSE) 2 MG/1.5ML ~~LOC~~ SOPN: PEN_INJECTOR | SUBCUTANEOUS | 0 refills | Status: AC

## 2020-07-25 MED ORDER — OZEMPIC (1 MG/DOSE) 2 MG/1.5ML ~~LOC~~ SOPN: 1.0000 mg | PEN_INJECTOR | SUBCUTANEOUS | 5 refills | Status: DC

## 2020-08-05 ENCOUNTER — Encounter (HOSPITAL_COMMUNITY): Payer: Self-pay

## 2020-08-05 ENCOUNTER — Ambulatory Visit (HOSPITAL_COMMUNITY)
Admission: EM | Admit: 2020-08-05 | Discharge: 2020-08-05 | Disposition: A | Discharge status: Home / Self Care | Payer: PRIVATE HEALTH INSURANCE | Attending: Physician Assistant | Admitting: Physician Assistant

## 2020-08-05 DIAGNOSIS — M5432 Sciatica, left side: Secondary | ICD-10-CM | POA: Diagnosis not present

## 2020-08-05 DIAGNOSIS — M549 Dorsalgia, unspecified: Secondary | ICD-10-CM

## 2020-08-05 DIAGNOSIS — R109 Unspecified abdominal pain: Secondary | ICD-10-CM

## 2020-08-05 DIAGNOSIS — N3000 Acute cystitis without hematuria: Secondary | ICD-10-CM | POA: Diagnosis not present

## 2020-08-05 LAB — POCT URINALYSIS DIPSTICK, ED / UC
Bilirubin Urine: NEGATIVE
Glucose, UA: 250 mg/dL — AB
Ketones, ur: NEGATIVE mg/dL
Nitrite: POSITIVE — AB
Protein, ur: NEGATIVE mg/dL
Specific Gravity, Urine: 1.025 (ref 1.005–1.030)
Urobilinogen, UA: 0.2 mg/dL (ref 0.0–1.0)
pH: 6 (ref 5.0–8.0)

## 2020-08-05 MED ORDER — SULFAMETHOXAZOLE-TRIMETHOPRIM 800-160 MG PO TABS: 1.0000 | ORAL_TABLET | Freq: Two times a day (BID) | ORAL | 0 refills | Status: AC

## 2020-08-05 NOTE — ED Triage Notes (Signed)
Pt c/o back pain, cloudy urine and foul odor in urine. Pt presents with LLQ pain x 1 week.

## 2020-08-05 NOTE — Discharge Instructions (Addendum)
Take medication as prescribed For sciatica take antiinflammatories.  Recommend light stretching and walking.  Follow up with primary care physician if no improvement.

## 2020-08-05 NOTE — ED Provider Notes (Signed)
[place]    CSN: [id number] Arrival date & time: 08/05/20  1551      History   Chief Complaint Chief Complaint  Patient presents with  . Back Pain  . Abdominal Pain    HPI Evan Leonard is a 60 y.o. male.   Pt complains of cloudy foul smelling urine that started several days ago.  Complains of LLQ discomfort.  Denies dysuria, fever, chills, flank pain.  He reports he has a h/o recurrent UTIs. He also complains of left buttocks pain with radiation to posterior left left to the knee.  No pain with ambulation.  Denies back pain.  Has taken nothing for the sx.      Past Medical History:  Diagnosis Date  . Diabetes mellitus, type II ([place]) 02/2013  . Diabetic ulcer of right foot due to type 2 diabetes mellitus ([place]) 03/21/2018  . DVT (deep venous thrombosis) ([place]) 05/04/2012   LLE  . Exertional dyspnea 05/04/2012   "isolated episode" (05/05/2012)  . Hepatic steatosis   . History of chickenpox   . Hypertension 03/21/2018  . Obesity, Class III, BMI 40-49.9 (morbid obesity) ([place]) 03/21/2018  . OSA on CPAP   . Peripheral vascular disease (HCC)    varicose veins  . Pulmonary embolism ([place]) 05/04/2012   bilaterally  . Pyelonephritis 04/2017  . Small bowel obstruction ([place]) 09/06/2015  . Varicose veins     Patient Active Problem List   Diagnosis Date Noted  . Eczema 12/27/2019  . Erectile dysfunction 12/27/2019  . SBO (small bowel obstruction) ([place]) 08/20/2018  . History of diabetic ulcer of foot 03/21/2018  . Atypical chest pain 02/09/2018  . Renal stone 05/12/2017  . S/P repair of recurrent ventral hernia 04/26/2017  . Chronic venous insufficiency 01/25/2015  . Diabetes mellitus with hyperglycemia ([place]) 03/31/2013  . Hyponatremia 03/23/2013  . Pulmonary embolism ([place]) 05/05/2012  . Morbid obesity with body mass index of 40.0-44.9 in adult [facility]) 05/05/2012  . Obstructive sleep apnea 05/05/2012  . DVT of left distal popliteal vein 05/05/2012    Past  Surgical History:  Procedure Laterality Date  . CYSTOSCOPY W/ URETERAL STENT PLACEMENT Right 05/12/2017   Procedure: CYSTOSCOPY WITH RETROGRADE PYELOGRAM/URETERAL STENT PLACEMENT;  Surgeon: [name], MD;  Location: [place];  Service: Urology;  Laterality: Right;  . CYSTOSCOPY/URETEROSCOPY/HOLMIUM LASER/STENT PLACEMENT Right 07/29/2017   Procedure: CYSTOSCOPY RIGHT URETEROSCOPY HOLMIUM LASER STENT EXCHANGE;  Surgeon: [name], MD;  Location: [facility];  Service: Urology;  Laterality: Right;  . HERNIA REPAIR     2015  . LAPAROTOMY N/A 12/13/2013   Procedure: EXPLORATORY LAPAROTOMY Repair ventral hernia, without mesh, partial omentectomy;  Surgeon: Gwenyth Ober, MD;  Location: [place];  Service: General;  Laterality: N/A;  . VENTRAL HERNIA REPAIR N/A 04/26/2017   Procedure: INCARCERATED HERNIA REPAIR VENTRAL ADULT;  Surgeon: Georganna Skeans, MD;  Location: [place];  Service: General;  Laterality: N/A;       Home Medications    Prior to Admission medications   Medication Sig Start Date End Date Taking? Authorizing Provider  sulfamethoxazole-trimethoprim (BACTRIM DS) 800-160 MG tablet Take 1 tablet by mouth 2 (two) times daily for 7 days. 08/05/20 08/12/20 Yes Aashir Umholtz, Janett Billow, PA-C  ACCU-CHEK GUIDE test strip USE AS DIRECTED UP TO 4 TIMES DAILY 12/30/19   [name], MD  blood glucose meter kit and supplies KIT Dispense based on patient and insurance preference. Use up to four times daily as directed. E11.65 12/09/19   Garret Reddish  O, MD  ELIQUIS 5 MG TABS tablet TAKE 1 TABLET BY MOUTH TWICE A DAY 11/17/19   [name], MD  empagliflozin (JARDIANCE) 10 MG TABS tablet Take 1 tablet (10 mg total) by mouth daily. 03/01/20   Inda Coke, PA  furosemide (LASIX) 40 MG tablet Take 1 tablet (40 mg total) by mouth daily as needed for fluid or edema. 02/09/18   [name], MD  metFORMIN (GLUCOPHAGE XR) 500 MG 24 hr tablet Take 2 tablets (1,000 mg total) by mouth in the morning and at  bedtime. 12/09/19   [name], MD  Multiple Vitamin (MULTIVITAMIN WITH MINERALS) TABS tablet Take 1 tablet by mouth daily.     [provider]  rosuvastatin (CRESTOR) 40 MG tablet Take 1 tablet (40 mg total) by mouth 3 (three) times a week. 07/24/20   [name], MD  Semaglutide, 1 MG/DOSE, (OZEMPIC, 1 MG/DOSE,) 2 MG/1.5ML SOPN Inject 1 mg into the skin once a week. 07/25/20   [name], MD  Semaglutide,0.25 or 0.5MG/DOS, (OZEMPIC, 0.25 OR 0.5 MG/DOSE,) 2 MG/1.5ML SOPN Inject 0.25 mg as directed once a week for 28 days, THEN 0.5 mg once a week for 14 days. 07/25/20 09/05/20  [name], MD  sildenafil (REVATIO) 20 MG tablet Take 2-5 tablets as needed once every 48 hours for erectile dysfunction 12/27/19   [name], MD  triamcinolone ointment (KENALOG) 0.5 % Apply 1 application topically 2 (two) times daily. For 7-14 days maximum 12/27/19   [name], MD    Family History Family History  Problem Relation Age of Onset  . Diabetes Mother        died age 19- also bleeding ulcer  . Hypertension Mother   . Heart failure Father        in his 21s (patient was 23). day before surgery planned  . Other Maternal Grandmother        states natural causes all grandparents    Social History Social History   Tobacco Use  . Smoking status: Never Smoker  . Smokeless tobacco: Never Used  Vaping Use  . Vaping Use: Never used  Substance Use Topics  . Alcohol use: No    Alcohol/week: 0.0 standard drinks  . Drug use: No     Allergies   Patient has no known allergies.   Review of Systems Review of Systems  Constitutional: Negative for chills and fever.  HENT: Negative for ear pain and sore throat.   Eyes: Negative for pain and visual disturbance.  Respiratory: Negative for cough and shortness of breath.   Cardiovascular: Negative for chest pain and palpitations.  Gastrointestinal: Negative for abdominal pain and vomiting.  Genitourinary: Negative  for dysuria, flank pain and hematuria.       Foul smelling odor   Musculoskeletal: Positive for myalgias. Negative for arthralgias and back pain.  Skin: Negative for color change and rash.  Neurological: Negative for seizures and syncope.  All other systems reviewed and are negative.    Physical Exam Triage Vital Signs ED Triage Vitals  Enc Vitals Group     BP 08/05/20 1603 (!) 151/89     Pulse Rate 08/05/20 1603 (!) 111     Resp 08/05/20 1603 16     Temp 08/05/20 1603 99 F (37.2 C)     Temp Source 08/05/20 1603 Oral     SpO2 08/05/20 1603 96 %     Weight --  Height --      Head Circumference --      Peak Flow --      Pain Score 08/05/20 1602 4     Pain Loc --      Pain Edu? --      Excl. in [place]? --    No data found.  Updated Vital Signs BP (!) 151/89 (BP Location: Right Arm)   Pulse (!) 111   Temp 99 F (37.2 C) (Oral)   Resp 16   SpO2 96%   Visual Acuity Right Eye Distance:   Left Eye Distance:   Bilateral Distance:    Right Eye Near:   Left Eye Near:    Bilateral Near:     Physical Exam Vitals and nursing note reviewed.  Constitutional:      Appearance: He is well-developed.  HENT:     Head: Normocephalic and atraumatic.  Eyes:     Conjunctiva/sclera: Conjunctivae normal.  Cardiovascular:     Rate and Rhythm: Normal rate and regular rhythm.     Heart sounds: No murmur heard.   Pulmonary:     Effort: Pulmonary effort is normal. No respiratory distress.     Breath sounds: Normal breath sounds.  Abdominal:     Palpations: Abdomen is soft.     Tenderness: There is no abdominal tenderness.  Musculoskeletal:     Cervical back: Neck supple.  Skin:    General: Skin is warm and dry.  Neurological:     Mental Status: He is alert.      UC Treatments / Results  Labs (all labs ordered are listed, but only abnormal results are displayed) Labs Reviewed  POCT URINALYSIS DIPSTICK, ED / UC - Abnormal; Notable for the following components:       Result Value   Glucose, UA 250 (*)    Hgb urine dipstick TRACE (*)    Nitrite POSITIVE (*)    Leukocytes,Ua SMALL (*)    All other components within normal limits    EKG   Radiology No results found.  Procedures Procedures (including critical care time)  Medications Ordered in UC Medications - No data to display  Initial Impression / Assessment and Plan / UC Course  I have reviewed the triage vital signs and the nursing notes.  Pertinent labs & imaging results that were available during my care of the patient were reviewed by me and considered in my medical decision making (see chart for details).     UTI, treat with Bactrim. Return precautions discussed.   Sciatica.  Recommend antiinflammatories and stretching.   Advised to follow up with PCP is no improvement/worsening sx.  Final Clinical Impressions(s) / UC Diagnoses   Final diagnoses:  Acute cystitis without hematuria  Sciatica of left side     Discharge Instructions     Take medication as prescribed For sciatica take antiinflammatories.  Recommend light stretching and walking.  Follow up with primary care physician if no improvement.    ED Prescriptions    Medication Sig Dispense Auth. Provider   sulfamethoxazole-trimethoprim (BACTRIM DS) 800-160 MG tablet Take 1 tablet by mouth 2 (two) times daily for 7 days. 14 tablet Konrad Felix, PA-C     PDMP not reviewed this encounter.   Konrad Felix, PA-C 08/05/20 1806

## 2020-08-22 ENCOUNTER — Other Ambulatory Visit: Payer: Self-pay | Admitting: Family Medicine

## 2020-09-10 ENCOUNTER — Other Ambulatory Visit: Payer: Self-pay

## 2020-09-10 ENCOUNTER — Encounter (HOSPITAL_COMMUNITY): Payer: Self-pay | Admitting: Emergency Medicine

## 2020-09-10 ENCOUNTER — Ambulatory Visit (HOSPITAL_COMMUNITY)
Admission: EM | Admit: 2020-09-10 | Discharge: 2020-09-10 | Disposition: A | Discharge status: Home / Self Care | Payer: PRIVATE HEALTH INSURANCE | Attending: Urgent Care | Admitting: Urgent Care

## 2020-09-10 DIAGNOSIS — R300 Dysuria: Secondary | ICD-10-CM | POA: Diagnosis present

## 2020-09-10 DIAGNOSIS — N3001 Acute cystitis with hematuria: Secondary | ICD-10-CM

## 2020-09-10 DIAGNOSIS — N390 Urinary tract infection, site not specified: Secondary | ICD-10-CM | POA: Diagnosis not present

## 2020-09-10 DIAGNOSIS — R350 Frequency of micturition: Secondary | ICD-10-CM

## 2020-09-10 LAB — POCT URINALYSIS DIPSTICK, ED / UC
Bilirubin Urine: NEGATIVE
Glucose, UA: 100 mg/dL — AB
Ketones, ur: NEGATIVE mg/dL
Nitrite: POSITIVE — AB
Protein, ur: NEGATIVE mg/dL
Specific Gravity, Urine: 1.02 (ref 1.005–1.030)
Urobilinogen, UA: 0.2 mg/dL (ref 0.0–1.0)
pH: 6 (ref 5.0–8.0)

## 2020-09-10 MED ORDER — CIPROFLOXACIN HCL 500 MG PO TABS: 500.0000 mg | ORAL_TABLET | Freq: Two times a day (BID) | ORAL | 0 refills | Status: DC

## 2020-09-10 NOTE — ED Triage Notes (Signed)
Urinary frequency and odor. Slight burning sensation.

## 2020-09-10 NOTE — Discharge Instructions (Signed)

## 2020-09-10 NOTE — ED Provider Notes (Signed)
[place]   MRN: [id number] DOB: 08-Mar-1961  Subjective:   Evan Leonard is a 60 y.o. male with past medical history of well-controlled diabetes not on insulinpresenting for 1 month history of persistent dysuria, urinary frequency, hematuria.  Patient states that he completed the entire course of Bactrim that was previously prescribed from his visit about a month ago.  Lab review does not show an urine culture.  He states that he had some improvement but ultimately the symptoms returned and he was not able to get time off work to come back in for an evaluation.  Denies fever, nausea, vomiting, abdominal pain, pelvic pain, testicular pain, flank pain, penile discharge.  Has a history of renal stones.  No current facility-administered medications for this encounter.  Current Outpatient Medications:  .  ACCU-CHEK GUIDE test strip, USE AS DIRECTED UP TO 4 TIMES DAILY, Disp: 100 strip, Rfl: 3 .  blood glucose meter kit and supplies KIT, Dispense based on patient and insurance preference. Use up to four times daily as directed. E11.65, Disp: 1 each, Rfl: 0 .  ELIQUIS 5 MG TABS tablet, TAKE 1 TABLET BY MOUTH TWICE A DAY, Disp: 60 tablet, Rfl: 5 .  empagliflozin (JARDIANCE) 10 MG TABS tablet, Take 1 tablet (10 mg total) by mouth daily., Disp: 90 tablet, Rfl: 0 .  furosemide (LASIX) 40 MG tablet, Take 1 tablet (40 mg total) by mouth daily as needed for fluid or edema., Disp: 30 tablet, Rfl: 2 .  metFORMIN (GLUCOPHAGE XR) 500 MG 24 hr tablet, Take 2 tablets (1,000 mg total) by mouth in the morning and at bedtime., Disp: 180 each, Rfl: 3 .  Multiple Vitamin (MULTIVITAMIN WITH MINERALS) TABS tablet, Take 1 tablet by mouth daily. , Disp: , Rfl:  .  rosuvastatin (CRESTOR) 40 MG tablet, Take 1 tablet (40 mg total) by mouth 3 (three) times a week., Disp: 39 tablet, Rfl: 3 .  Semaglutide, 1 MG/DOSE, (OZEMPIC, 1 MG/DOSE,) 2 MG/1.5ML SOPN, Inject 1 mg into the skin once a week., Disp: 3  mL, Rfl: 5 .  sildenafil (REVATIO) 20 MG tablet, Take 2-5 tablets as needed once every 48 hours for erectile dysfunction, Disp: 30 tablet, Rfl: 3 .  triamcinolone ointment (KENALOG) 0.5 %, Apply 1 application topically 2 (two) times daily. For 7-14 days maximum, Disp: 60 g, Rfl: 1   No Known Allergies  Past Medical History:  Diagnosis Date  . Diabetes mellitus, type II ([place]) 02/2013  . Diabetic ulcer of right foot due to type 2 diabetes mellitus ([place]) 03/21/2018  . DVT (deep venous thrombosis) ([place]) 05/04/2012   LLE  . Exertional dyspnea 05/04/2012   "isolated episode" (05/05/2012)  . Hepatic steatosis   . History of chickenpox   . Hypertension 03/21/2018  . Obesity, Class III, BMI 40-49.9 (morbid obesity) ([place]) 03/21/2018  . OSA on CPAP   . Peripheral vascular disease (HCC)    varicose veins  . Pulmonary embolism ([place]) 05/04/2012   bilaterally  . Pyelonephritis 04/2017  . Small bowel obstruction ([place]) 09/06/2015  . Varicose veins      Past Surgical History:  Procedure Laterality Date  . CYSTOSCOPY W/ URETERAL STENT PLACEMENT Right 05/12/2017   Procedure: CYSTOSCOPY WITH RETROGRADE PYELOGRAM/URETERAL STENT PLACEMENT;  Surgeon: [name], MD;  Location: [place];  Service: Urology;  Laterality: Right;  . CYSTOSCOPY/URETEROSCOPY/HOLMIUM LASER/STENT PLACEMENT Right 07/29/2017   Procedure: CYSTOSCOPY RIGHT URETEROSCOPY HOLMIUM LASER STENT EXCHANGE;  Surgeon: [name], MD;  Location: [facility];  Service: Urology;  Laterality: Right;  . HERNIA REPAIR     2015  . LAPAROTOMY N/A 12/13/2013   Procedure: EXPLORATORY LAPAROTOMY Repair ventral hernia, without mesh, partial omentectomy;  Surgeon: Gwenyth Ober, MD;  Location: [place];  Service: General;  Laterality: N/A;  . VENTRAL HERNIA REPAIR N/A 04/26/2017   Procedure: INCARCERATED HERNIA REPAIR VENTRAL ADULT;  Surgeon: Georganna Skeans, MD;  Location: [facility];  Service: General;  Laterality: N/A;    Family History  Problem Relation Age of Onset   . Diabetes Mother        died age 46- also bleeding ulcer  . Hypertension Mother   . Heart failure Father        in his 15s (patient was 40). day before surgery planned  . Other Maternal Grandmother        states natural causes all grandparents    Social History   Tobacco Use  . Smoking status: Never Smoker  . Smokeless tobacco: Never Used  Vaping Use  . Vaping Use: Never used  Substance Use Topics  . Alcohol use: No    Alcohol/week: 0.0 standard drinks  . Drug use: No    ROS   Objective:   Vitals: BP 121/77   Pulse 84   Temp 98.6 F (37 C) (Oral)   Resp 16   SpO2 96%   Physical Exam Constitutional:      General: He is not in acute distress.    Appearance: Normal appearance. He is well-developed. He is obese. He is not ill-appearing, toxic-appearing or diaphoretic.  HENT:     Head: Normocephalic and atraumatic.     Right Ear: External ear normal.     Left Ear: External ear normal.     Nose: Nose normal.     Mouth/Throat:     Pharynx: Oropharynx is clear.  Eyes:     General: No scleral icterus.       Right eye: No discharge.        Left eye: No discharge.     Extraocular Movements: Extraocular movements intact.     Pupils: Pupils are equal, round, and reactive to light.  Cardiovascular:     Rate and Rhythm: Normal rate.  Pulmonary:     Effort: Pulmonary effort is normal.  Abdominal:     General: Bowel sounds are normal. There is no distension.     Palpations: Abdomen is soft. There is no mass.     Tenderness: There is no abdominal tenderness. There is no right CVA tenderness, left CVA tenderness, guarding or rebound.  Musculoskeletal:     Cervical back: Normal range of motion.  Neurological:     Mental Status: He is alert and oriented to person, place, and time.  Psychiatric:        Mood and Affect: Mood normal.        Behavior: Behavior normal.        Thought Content: Thought content normal.        Judgment: Judgment normal.     Results for  orders placed or performed during the hospital encounter of 09/10/20 (from the past 24 hour(s))  POC Urinalysis dipstick     Status: Abnormal   Collection Time: 09/10/20  2:28 PM  Result Value Ref Range   Glucose, UA 100 (A) NEGATIVE mg/dL   Bilirubin Urine NEGATIVE NEGATIVE   Ketones, ur NEGATIVE NEGATIVE mg/dL   Specific Gravity, Urine 1.020 1.005 - 1.030   Hgb urine dipstick TRACE (A) NEGATIVE  pH 6.0 5.0 - 8.0   Protein, ur NEGATIVE NEGATIVE mg/dL   Urobilinogen, UA 0.2 0.0 - 1.0 mg/dL   Nitrite POSITIVE (A) NEGATIVE   Leukocytes,Ua LARGE (A) NEGATIVE    Assessment and Plan :   PDMP not reviewed this encounter.  1. Acute cystitis with hematuria   2. Dysuria   3. Urinary frequency     Urine culture pending.  Recommended aggressive hydration with plain water.  No signs of an acute medical emergency such as obstructing renal stone, pyelonephritis, prostatitis.  Discussed antibiotic options and ultimately we agreed to use ciprofloxacin, risk for tendon rupture reviewed thoroughly. Counseled patient on potential for adverse effects with medications prescribed/recommended today, ER and return-to-clinic precautions discussed, patient verbalized understanding.    Jaynee Eagles, PA-C 09/10/20 1444

## 2020-09-12 LAB — URINE CULTURE: Culture: >=100000 — AB

## 2020-10-26 ENCOUNTER — Ambulatory Visit: Payer: PRIVATE HEALTH INSURANCE | Admitting: Family Medicine

## 2020-10-26 ENCOUNTER — Other Ambulatory Visit: Payer: Self-pay | Admitting: Family Medicine

## 2020-10-27 ENCOUNTER — Other Ambulatory Visit: Payer: Self-pay | Admitting: Family Medicine

## 2020-10-31 ENCOUNTER — Telehealth: Payer: Self-pay

## 2020-10-31 NOTE — Telephone Encounter (Signed)
-----   Message from [name], MD sent at 09/26/2020  1:24 PM EDT ----- Status update? Please encourage patient if not done ----- Message ----- From: SYSTEM Sent: 07/25/2020  12:15 AM EDT To: [name], MD

## 2020-10-31 NOTE — Telephone Encounter (Signed)
Called and lm for pt tcb regarding Cologuard.

## 2020-11-18 ENCOUNTER — Ambulatory Visit (HOSPITAL_COMMUNITY)
Admission: EM | Admit: 2020-11-18 | Discharge: 2020-11-18 | Disposition: A | Discharge status: Home / Self Care | Payer: No Typology Code available for payment source | Attending: Urgent Care | Admitting: Urgent Care

## 2020-11-18 ENCOUNTER — Encounter (HOSPITAL_COMMUNITY): Payer: Self-pay

## 2020-11-18 ENCOUNTER — Other Ambulatory Visit: Payer: Self-pay

## 2020-11-18 ENCOUNTER — Ambulatory Visit (INDEPENDENT_AMBULATORY_CARE_PROVIDER_SITE_OTHER): Payer: No Typology Code available for payment source

## 2020-11-18 DIAGNOSIS — R079 Chest pain, unspecified: Secondary | ICD-10-CM | POA: Diagnosis not present

## 2020-11-18 DIAGNOSIS — R059 Cough, unspecified: Secondary | ICD-10-CM

## 2020-11-18 DIAGNOSIS — R110 Nausea: Secondary | ICD-10-CM | POA: Diagnosis not present

## 2020-11-18 DIAGNOSIS — Z28311 Partially vaccinated for covid-19: Secondary | ICD-10-CM | POA: Diagnosis not present

## 2020-11-18 DIAGNOSIS — R0989 Other specified symptoms and signs involving the circulatory and respiratory systems: Secondary | ICD-10-CM | POA: Insufficient documentation

## 2020-11-18 DIAGNOSIS — Z7901 Long term (current) use of anticoagulants: Secondary | ICD-10-CM | POA: Insufficient documentation

## 2020-11-18 DIAGNOSIS — Z20822 Contact with and (suspected) exposure to covid-19: Secondary | ICD-10-CM | POA: Diagnosis not present

## 2020-11-18 DIAGNOSIS — J189 Pneumonia, unspecified organism: Secondary | ICD-10-CM | POA: Insufficient documentation

## 2020-11-18 DIAGNOSIS — Z86711 Personal history of pulmonary embolism: Secondary | ICD-10-CM | POA: Diagnosis not present

## 2020-11-18 DIAGNOSIS — Z79899 Other long term (current) drug therapy: Secondary | ICD-10-CM | POA: Insufficient documentation

## 2020-11-18 MED ORDER — PROMETHAZINE-DM 6.25-15 MG/5ML PO SYRP: 5.0000 mL | ORAL_SOLUTION | Freq: Every evening | ORAL | 0 refills | Status: DC | PRN

## 2020-11-18 MED ORDER — AZITHROMYCIN 250 MG PO TABS: ORAL_TABLET | ORAL | 0 refills | Status: DC

## 2020-11-18 NOTE — ED Triage Notes (Signed)
Patient presents to Urgent Care with complaints of dry cough, chest congestion, and nausea x 2 weeks ago. Treating symptoms with advil.   Denies SOB or fever.

## 2020-11-18 NOTE — ED Provider Notes (Signed)
[place]   MRN: [id number] DOB: Oct 11, 1960  Subjective:   Evan Leonard is a 60 y.o. male presenting for 2-week history of persistent cough, chest congestion.  Has also had intermittent nausea without vomiting.  Has a history of a pulmonary embolism and is on Eliquis daily.  Denies active chest pain and shortness of breath.  He has not been COVID tested.  He is not fully vaccinated for COVID, only received 1 of 2 vaccines.  No current facility-administered medications for this encounter.  Current Outpatient Medications:    ACCU-CHEK GUIDE test strip, USE AS DIRECTED UP TO 4 TIMES DAILY, Disp: 100 strip, Rfl: 3   blood glucose meter kit and supplies KIT, Dispense based on patient and insurance preference. Use up to four times daily as directed. E11.65, Disp: 1 each, Rfl: 0   ciprofloxacin (CIPRO) 500 MG tablet, Take 1 tablet (500 mg total) by mouth every 12 (twelve) hours., Disp: 20 tablet, Rfl: 0   ELIQUIS 5 MG TABS tablet, TAKE 1 TABLET BY MOUTH TWICE A DAY, Disp: 60 tablet, Rfl: 5   empagliflozin (JARDIANCE) 10 MG TABS tablet, Take 1 tablet (10 mg total) by mouth daily., Disp: 90 tablet, Rfl: 0   furosemide (LASIX) 40 MG tablet, Take 1 tablet (40 mg total) by mouth daily as needed for fluid or edema., Disp: 30 tablet, Rfl: 2   metFORMIN (GLUCOPHAGE XR) 500 MG 24 hr tablet, Take 2 tablets (1,000 mg total) by mouth in the morning and at bedtime., Disp: 180 each, Rfl: 3   Multiple Vitamin (MULTIVITAMIN WITH MINERALS) TABS tablet, Take 1 tablet by mouth daily. , Disp: , Rfl:    rosuvastatin (CRESTOR) 40 MG tablet, Take 1 tablet (40 mg total) by mouth 3 (three) times a week., Disp: 39 tablet, Rfl: 3   Semaglutide, 1 MG/DOSE, (OZEMPIC, 1 MG/DOSE,) 2 MG/1.5ML SOPN, Inject 1 mg into the skin once a week., Disp: 3 mL, Rfl: 5   sildenafil (REVATIO) 20 MG tablet, Take 2-5 tablets as needed once every 48 hours for erectile dysfunction, Disp: 30 tablet, Rfl: 3   triamcinolone  ointment (KENALOG) 0.5 %, Apply 1 application topically 2 (two) times daily. For 7-14 days maximum, Disp: 60 g, Rfl: 1   No Known Allergies  Past Medical History:  Diagnosis Date   Diabetes mellitus, type II ([place]) 02/2013   Diabetic ulcer of right foot due to type 2 diabetes mellitus ([place]) 03/21/2018   DVT (deep venous thrombosis) ([place]) 05/04/2012   LLE   Exertional dyspnea 05/04/2012   "isolated episode" (05/05/2012)   Hepatic steatosis    History of chickenpox    Hypertension 03/21/2018   Obesity, Class III, BMI 40-49.9 (morbid obesity) ([place]) 03/21/2018   OSA on CPAP    Peripheral vascular disease (HCC)    varicose veins   Pulmonary embolism ([place]) 05/04/2012   bilaterally   Pyelonephritis 04/2017   Small bowel obstruction ([place]) 09/06/2015   Varicose veins      Past Surgical History:  Procedure Laterality Date   CYSTOSCOPY W/ URETERAL STENT PLACEMENT Right 05/12/2017   Procedure: CYSTOSCOPY WITH RETROGRADE PYELOGRAM/URETERAL STENT PLACEMENT;  Surgeon: [name], MD;  Location: [place];  Service: Urology;  Laterality: Right;   CYSTOSCOPY/URETEROSCOPY/HOLMIUM LASER/STENT PLACEMENT Right 07/29/2017   Procedure: CYSTOSCOPY RIGHT URETEROSCOPY HOLMIUM LASER STENT EXCHANGE;  Surgeon: [name], MD;  [facility];  Service: Urology;  Laterality: Right;   HERNIA REPAIR     2015   LAPAROTOMY N/A  12/13/2013   Procedure: EXPLORATORY LAPAROTOMY Repair ventral hernia, without mesh, partial omentectomy;  Surgeon: Gwenyth Ober, MD;  Location: [place];  Service: General;  Laterality: N/A;   VENTRAL HERNIA REPAIR N/A 04/26/2017   Procedure: INCARCERATED HERNIA REPAIR VENTRAL ADULT;  Surgeon: Georganna Skeans, MD;  Location: [place];  Service: General;  Laterality: N/A;    Family History  Problem Relation Age of Onset   Diabetes Mother        died age 10- also bleeding ulcer   Hypertension Mother    Heart failure Father        in his 46s (patient was 21). day before surgery planned   Other  Maternal Grandmother        states natural causes all grandparents    Social History   Tobacco Use   Smoking status: Never   Smokeless tobacco: Never  Vaping Use   Vaping Use: Never used  Substance Use Topics   Alcohol use: No    Alcohol/week: 0.0 standard drinks   Drug use: No    ROS   Objective:   Vitals: BP 117/70 (BP Location: Right Arm)   Pulse 95   Temp 99.5 F (37.5 C) (Oral)   Resp (!) 22   SpO2 95%   Physical Exam Constitutional:      General: He is not in acute distress.    Appearance: Normal appearance. He is well-developed. He is not ill-appearing, toxic-appearing or diaphoretic.  HENT:     Head: Normocephalic and atraumatic.     Right Ear: External ear normal.     Left Ear: External ear normal.     Nose: Nose normal.     Mouth/Throat:     Mouth: Mucous membranes are moist.     Pharynx: Oropharynx is clear.  Eyes:     General: No scleral icterus.    Extraocular Movements: Extraocular movements intact.     Pupils: Pupils are equal, round, and reactive to light.  Cardiovascular:     Rate and Rhythm: Normal rate and regular rhythm.     Heart sounds: Normal heart sounds. No murmur heard.   No friction rub. No gallop.  Pulmonary:     Effort: Pulmonary effort is normal. No respiratory distress.     Breath sounds: Normal breath sounds. No stridor. No wheezing, rhonchi or rales.  Neurological:     Mental Status: He is alert and oriented to person, place, and time.  Psychiatric:        Mood and Affect: Mood normal.        Behavior: Behavior normal.        Thought Content: Thought content normal.    DG Chest 2 View  Result Date: 11/18/2020 CLINICAL DATA:  Cough, chest congestion EXAM: CHEST - 2 VIEW COMPARISON:  01/05/2018 FINDINGS: The heart size and mediastinal contours are within normal limits. Mild, diffuse interstitial pulmonary opacity. The visualized skeletal structures are unremarkable. IMPRESSION: Mild, diffuse interstitial pulmonary opacity,  which may reflect edema or infection. No focal airspace opacity. Electronically Signed   By: Eddie Candle M.D.   On: 11/18/2020 16:28     Assessment and Plan :   PDMP not reviewed this encounter.  1. Pneumonia of both lungs due to infectious organism, unspecified part of lung   2. Cough   3. Chest congestion     We will have patient start 5-day course of azithromycin to address pneumonia given the diffuse interstitial pulmonary opacity.  COVID-19 testing pending.  Recommended  supportive care otherwise.  Repeat chest x-ray in 3 to 4 weeks. Counseled patient on potential for adverse effects with medications prescribed/recommended today, ER and return-to-clinic precautions discussed, patient verbalized understanding.    Jaynee Eagles, PA-C 11/18/20 1711

## 2020-11-19 LAB — SARS CORONAVIRUS 2 (TAT 6-24 HRS): SARS Coronavirus 2: NEGATIVE

## 2020-11-20 ENCOUNTER — Ambulatory Visit: Payer: No Typology Code available for payment source | Admitting: Family Medicine

## 2020-12-07 ENCOUNTER — Other Ambulatory Visit: Payer: Self-pay | Admitting: Family Medicine

## 2020-12-11 ENCOUNTER — Ambulatory Visit (HOSPITAL_COMMUNITY)
Admission: EM | Admit: 2020-12-11 | Discharge: 2020-12-11 | Disposition: A | Discharge status: Home / Self Care | Payer: No Typology Code available for payment source | Attending: Emergency Medicine | Admitting: Emergency Medicine

## 2020-12-11 ENCOUNTER — Telehealth: Payer: Self-pay

## 2020-12-11 ENCOUNTER — Other Ambulatory Visit: Payer: Self-pay

## 2020-12-11 ENCOUNTER — Encounter (HOSPITAL_COMMUNITY): Payer: Self-pay

## 2020-12-11 DIAGNOSIS — R600 Localized edema: Secondary | ICD-10-CM | POA: Diagnosis not present

## 2020-12-11 DIAGNOSIS — R300 Dysuria: Secondary | ICD-10-CM | POA: Insufficient documentation

## 2020-12-11 LAB — POCT URINALYSIS DIPSTICK, ED / UC
Bilirubin Urine: NEGATIVE
Glucose, UA: >=1000 mg/dL — AB
Ketones, ur: 15 mg/dL — AB
Leukocytes,Ua: NEGATIVE
Nitrite: NEGATIVE
Protein, ur: NEGATIVE mg/dL
Specific Gravity, Urine: 1.02 (ref 1.005–1.030)
Urobilinogen, UA: 0.2 mg/dL (ref 0.0–1.0)
pH: 5.5 (ref 5.0–8.0)

## 2020-12-11 LAB — BASIC METABOLIC PANEL
Anion gap: 12 (ref 5–15)
BUN: 15 mg/dL (ref 8–23)
CO2: 22 mmol/L (ref 22–32)
Calcium: 9.4 mg/dL (ref 8.9–10.3)
Chloride: 100 mmol/L (ref 98–111)
Creatinine, Ser: 1.15 mg/dL (ref 0.61–1.24)
GFR, Estimated: >60 mL/min (ref >60)
Glucose, Bld: 165 mg/dL — ABNORMAL HIGH (ref 70–99)
Potassium: 4.3 mmol/L (ref 3.5–5.1)
Sodium: 134 mmol/L — ABNORMAL LOW (ref 135–145)

## 2020-12-11 LAB — CBG MONITORING, ED: Glucose-Capillary: 161 mg/dL — ABNORMAL HIGH (ref 70–99)

## 2020-12-11 LAB — BRAIN NATRIURETIC PEPTIDE: B Natriuretic Peptide: 30.1 pg/mL (ref 0.0–100.0)

## 2020-12-11 MED ORDER — METFORMIN HCL ER 500 MG PO TB24
1000.0000 mg | ORAL_TABLET | Freq: Two times a day (BID) | ORAL | 3 refills | Status: DC
Start: 2020-12-11 — End: 2021-01-03

## 2020-12-11 MED ORDER — FUROSEMIDE 20 MG PO TABS: 20.0000 mg | ORAL_TABLET | Freq: Every day | ORAL | 0 refills | Status: DC

## 2020-12-11 NOTE — ED Provider Notes (Signed)
[place]    CSN: [id number] Arrival date & time: 12/11/20  1432      History   Chief Complaint Chief Complaint  Patient presents with   Urinary Tract Infection    HPI Evan Leonard is a 60 y.o. male.   Patient here for evaluation of possible UTI.  Reports having some urgency, frequency, and dysuria that started yesterday.  Reports symptoms are similar to UTIs in the past.  Also reports having some left lower extremity swelling that started on Friday.  Reports a fall the previous Friday.  Patient does reports history of DVT in left leg and is currently on Eliquis and reports compliance with medications.  Denies any specific alleviating or aggravating factors.  Denies any fevers, chest pain, shortness of breath, N/V/D, numbness, tingling, weakness, abdominal pain, or headaches.    The history is provided by the patient.  Urinary Tract Infection Presenting symptoms: dysuria   Associated symptoms: urinary frequency    Past Medical History:  Diagnosis Date   Diabetes mellitus, type II ([place]) 02/2013   Diabetic ulcer of right foot due to type 2 diabetes mellitus ([place]) 03/21/2018   DVT (deep venous thrombosis) ([place]) 05/04/2012   LLE   Exertional dyspnea 05/04/2012   "isolated episode" (05/05/2012)   Hepatic steatosis    History of chickenpox    Hypertension 03/21/2018   Obesity, Class III, BMI 40-49.9 (morbid obesity) ([place]) 03/21/2018   OSA on CPAP    Peripheral vascular disease (HCC)    varicose veins   Pulmonary embolism ([place]) 05/04/2012   bilaterally   Pyelonephritis 04/2017   Small bowel obstruction ([place]) 09/06/2015   Varicose veins     Patient Active Problem List   Diagnosis Date Noted   Eczema 12/27/2019   Erectile dysfunction 12/27/2019   SBO (small bowel obstruction) ([place]) 08/20/2018   History of diabetic ulcer of foot 03/21/2018   Atypical chest pain 02/09/2018   Renal stone 05/12/2017   S/P repair of recurrent ventral hernia 04/26/2017    Chronic venous insufficiency 01/25/2015   Diabetes mellitus with hyperglycemia ([place]) 03/31/2013   Hyponatremia 03/23/2013   Pulmonary embolism ([place]) 05/05/2012   Morbid obesity with body mass index of 40.0-44.9 in adult ([place]) 05/05/2012   Obstructive sleep apnea 05/05/2012   DVT of left distal popliteal vein 05/05/2012    Past Surgical History:  Procedure Laterality Date   CYSTOSCOPY W/ URETERAL STENT PLACEMENT Right 05/12/2017   Procedure: CYSTOSCOPY WITH RETROGRADE PYELOGRAM/URETERAL STENT PLACEMENT;  Surgeon: [name], MD;  Location: [facility];  Service: Urology;  Laterality: Right;   CYSTOSCOPY/URETEROSCOPY/HOLMIUM LASER/STENT PLACEMENT Right 07/29/2017   Procedure: CYSTOSCOPY RIGHT URETEROSCOPY HOLMIUM LASER STENT EXCHANGE;  Surgeon: [name], MD;  Location[facility];  Service: Urology;  Laterality: Right;   HERNIA REPAIR     2015   LAPAROTOMY N/A 12/13/2013   Procedure: EXPLORATORY LAPAROTOMY Repair ventral hernia, without mesh, partial omentectomy;  Surgeon: Gwenyth Ober, MD;  Location: [facility];  Service: General;  Laterality: N/A;   VENTRAL HERNIA REPAIR N/A 04/26/2017   Procedure: INCARCERATED HERNIA REPAIR VENTRAL ADULT;  Surgeon: Georganna Skeans, MD;  Location: [facility];  Service: General;  Laterality: N/A;       Home Medications    Prior to Admission medications   Medication Sig Start Date End Date Taking? Authorizing Provider  furosemide (LASIX) 20 MG tablet Take 1 tablet (20 mg total) by mouth daily for 3 days. 12/11/20 12/14/20 Yes Pearson Forster, NP  ACCU-CHEK GUIDE  test strip USE AS DIRECTED UP TO 4 TIMES DAILY 12/30/19   [name], MD  azithromycin (ZITHROMAX) 250 MG tablet Start with 2 tablets today, then 1 daily thereafter. 11/18/20   Jaynee Eagles, PA-C  blood glucose meter kit and supplies KIT Dispense based on patient and insurance preference. Use up to four times daily as directed. E11.65 12/09/19   [name], MD  ciprofloxacin (CIPRO) 500 MG tablet Take 1  tablet (500 mg total) by mouth every 12 (twelve) hours. 09/10/20   Jaynee Eagles, PA-C  ELIQUIS 5 MG TABS tablet TAKE 1 TABLET BY MOUTH TWICE A DAY 10/30/20   [name], MD  empagliflozin (JARDIANCE) 10 MG TABS tablet Take 1 tablet (10 mg total) by mouth daily. 03/01/20   Inda Coke, PA  metFORMIN (GLUCOPHAGE XR) 500 MG 24 hr tablet Take 2 tablets (1,000 mg total) by mouth in the morning and at bedtime. 12/11/20   [name], MD  Multiple Vitamin (MULTIVITAMIN WITH MINERALS) TABS tablet Take 1 tablet by mouth daily.     [provider]  promethazine-dextromethorphan (PROMETHAZINE-DM) 6.25-15 MG/5ML syrup Take 5 mLs by mouth at bedtime as needed for cough. 11/18/20   Jaynee Eagles, PA-C  rosuvastatin (CRESTOR) 40 MG tablet Take 1 tablet (40 mg total) by mouth 3 (three) times a week. 07/24/20   [name], MD  Semaglutide, 1 MG/DOSE, (OZEMPIC, 1 MG/DOSE,) 2 MG/1.5ML SOPN Inject 1 mg into the skin once a week. 07/25/20   [name], MD  sildenafil (REVATIO) 20 MG tablet Take 2-5 tablets as needed once every 48 hours for erectile dysfunction 12/27/19   [name], MD  triamcinolone ointment (KENALOG) 0.5 % Apply 1 application topically 2 (two) times daily. For 7-14 days maximum 12/27/19   [name], MD    Family History Family History  Problem Relation Age of Onset   Diabetes Mother        died age 47- also bleeding ulcer   Hypertension Mother    Heart failure Father        in his 55s (patient was 43). day before surgery planned   Other Maternal Grandmother        states natural causes all grandparents    Social History Social History   Tobacco Use   Smoking status: Never   Smokeless tobacco: Never  Vaping Use   Vaping Use: Never used  Substance Use Topics   Alcohol use: No    Alcohol/week: 0.0 standard drinks   Drug use: No     Allergies   Patient has no known allergies.   Review of Systems Review of Systems  Cardiovascular:   Positive for leg swelling. Negative for chest pain and palpitations.  Genitourinary:  Positive for decreased urine volume, dysuria, frequency and urgency.  All other systems reviewed and are negative.   Physical Exam Triage Vital Signs ED Triage Vitals  Enc Vitals Group     BP 12/11/20 1653 (!) 122/93     Pulse Rate 12/11/20 1653 100     Resp 12/11/20 1653 20     Temp 12/11/20 1653 98.9 F (37.2 C)     Temp Source 12/11/20 1653 Oral     SpO2 12/11/20 1653 96 %     Weight --      Height --      Head Circumference --      Peak Flow --      Pain Score 12/11/20 1650 9  Pain Loc --      Pain Edu? --      Excl. in [place]? --    No data found.  Updated Vital Signs BP (!) 122/93 (BP Location: Right Arm)   Pulse 100   Temp 98.9 F (37.2 C) (Oral)   Resp 20   SpO2 96%   Visual Acuity Right Eye Distance:   Left Eye Distance:   Bilateral Distance:    Right Eye Near:   Left Eye Near:    Bilateral Near:     Physical Exam Vitals and nursing note reviewed.  Constitutional:      General: He is not in acute distress.    Appearance: Normal appearance. He is not ill-appearing, toxic-appearing or diaphoretic.  HENT:     Head: Normocephalic and atraumatic.  Eyes:     Conjunctiva/sclera: Conjunctivae normal.  Cardiovascular:     Rate and Rhythm: Normal rate.     Pulses: Normal pulses.  Pulmonary:     Effort: Pulmonary effort is normal.  Abdominal:     General: Abdomen is flat.  Musculoskeletal:        General: Normal range of motion.     Cervical back: Normal range of motion.     Right lower leg: 2+ Edema present.     Left lower leg: 2+ Edema present.  Skin:    General: Skin is warm and dry.  Neurological:     General: No focal deficit present.     Mental Status: He is alert and oriented to person, place, and time.  Psychiatric:        Mood and Affect: Mood normal.     UC Treatments / Results  Labs (all labs ordered are listed, but only abnormal results are  displayed) Labs Reviewed  POCT URINALYSIS DIPSTICK, ED / UC - Abnormal; Notable for the following components:      Result Value   Glucose, UA >=1000 (*)    Ketones, ur 15 (*)    Hgb urine dipstick TRACE (*)    All other components within normal limits  CBG MONITORING, ED - Abnormal; Notable for the following components:   Glucose-Capillary 161 (*)    All other components within normal limits  URINE CULTURE  BRAIN NATRIURETIC PEPTIDE  BASIC METABOLIC PANEL    EKG   Radiology No results found.  Procedures Procedures (including critical care time)  Medications Ordered in UC Medications - No data to display  Initial Impression / Assessment and Plan / UC Course  I have reviewed the triage vital signs and the nursing notes.  Pertinent labs & imaging results that were available during my care of the patient were reviewed by me and considered in my medical decision making (see chart for details).    Assessment negative for red flags or concerns.   Dysuria: Urinalysis positive for ketones, hemoglobin, and greater than 1000 glucose.  Patient does take Jardiance for diabetes.  CBG 161.  Urine culture pending.   Leg Edema: Patient does not appear to have a history of CHF but will check a BNP and BMP, recent chest xray with no signs of CHF.  Lasix $Remo'40mg'yiFeL$  x3 days for leg edema.  DVT unlikely as patient is already on Eliquis.  May take Tylenol and/or ibuprofen as needed for pain and fevers.  Recommend elevating legs when sitting or laying down. Follow-up with primary care for reevaluation and ensure improvement of symptoms.   Final Clinical Impressions(s) / UC Diagnoses   Final diagnoses:  Dysuria  Leg edema     Discharge Instructions      Take the Lasix daily for the next 3 days.  This should help with your leg swelling.   You can take Tylenol and/or Ibuprofen as needed for pain relief and fever reduction.   Elevate your leg when you are sitting or laying down.    We will contact  you if any of your labs are abnormal.    Follow up with your primary care for re-evaluation.   Return or go to the Emergency Department if symptoms worsen or do not improve in the next few days.      ED Prescriptions     Medication Sig Dispense Auth. Provider   furosemide (LASIX) 20 MG tablet Take 1 tablet (20 mg total) by mouth daily for 3 days. 3 tablet Pearson Forster, NP      PDMP not reviewed this encounter.   Pearson Forster, NP 12/11/20 1758

## 2020-12-11 NOTE — Telephone Encounter (Signed)
  LAST APPOINTMENT DATE:  07/20/20  NEXT APPOINTMENT DATE:@7 /29/2022  MEDICATION:Methorphan  Is the patient out of medication? Yes  PHARMACY: [id number] - [place] [address].

## 2020-12-11 NOTE — Discharge Instructions (Addendum)
Take the Lasix daily for the next 3 days.  This should help with your leg swelling.   You can take Tylenol and/or Ibuprofen as needed for pain relief and fever reduction.   Elevate your leg when you are sitting or laying down.    We will contact you if any of your labs are abnormal.    Follow up with your primary care for re-evaluation.   Return or go to the Emergency Department if symptoms worsen or do not improve in the next few days.

## 2020-12-11 NOTE — Telephone Encounter (Signed)
METFORMIN sent to pharmacy (pt is not on a medication called Methorphan) Just a little FYI, please make sure to get the correct spelling/name of the medication from the pt. This was not the correct spelling or name, he was needing METFORMIN.

## 2020-12-11 NOTE — ED Triage Notes (Addendum)
States he might have a UTI, urine is cloudy with a smell per patient and having some urinary urgency.denies flank and abd pain. Symptoms started yesterday.  Had a fall last Friday and is having leg pain with swelling. Patient is on eliquis and is ambulatory.

## 2020-12-14 LAB — URINE CULTURE: Culture: 60000 — AB

## 2020-12-20 ENCOUNTER — Encounter (HOSPITAL_COMMUNITY): Payer: Self-pay

## 2020-12-20 ENCOUNTER — Ambulatory Visit (HOSPITAL_COMMUNITY)
Admission: EM | Admit: 2020-12-20 | Discharge: 2020-12-20 | Disposition: A | Discharge status: Home / Self Care | Payer: No Typology Code available for payment source | Attending: Urgent Care | Admitting: Urgent Care

## 2020-12-20 ENCOUNTER — Other Ambulatory Visit: Payer: Self-pay

## 2020-12-20 ENCOUNTER — Ambulatory Visit (INDEPENDENT_AMBULATORY_CARE_PROVIDER_SITE_OTHER): Payer: No Typology Code available for payment source

## 2020-12-20 DIAGNOSIS — Z8701 Personal history of pneumonia (recurrent): Secondary | ICD-10-CM | POA: Insufficient documentation

## 2020-12-20 DIAGNOSIS — Z7901 Long term (current) use of anticoagulants: Secondary | ICD-10-CM | POA: Insufficient documentation

## 2020-12-20 DIAGNOSIS — U071 COVID-19: Secondary | ICD-10-CM | POA: Insufficient documentation

## 2020-12-20 DIAGNOSIS — Z8249 Family history of ischemic heart disease and other diseases of the circulatory system: Secondary | ICD-10-CM | POA: Diagnosis not present

## 2020-12-20 DIAGNOSIS — J069 Acute upper respiratory infection, unspecified: Secondary | ICD-10-CM | POA: Diagnosis not present

## 2020-12-20 DIAGNOSIS — R079 Chest pain, unspecified: Secondary | ICD-10-CM | POA: Diagnosis present

## 2020-12-20 DIAGNOSIS — Z7984 Long term (current) use of oral hypoglycemic drugs: Secondary | ICD-10-CM | POA: Diagnosis not present

## 2020-12-20 DIAGNOSIS — Z86718 Personal history of other venous thrombosis and embolism: Secondary | ICD-10-CM | POA: Diagnosis not present

## 2020-12-20 DIAGNOSIS — Z792 Long term (current) use of antibiotics: Secondary | ICD-10-CM | POA: Insufficient documentation

## 2020-12-20 DIAGNOSIS — Z79899 Other long term (current) drug therapy: Secondary | ICD-10-CM | POA: Insufficient documentation

## 2020-12-20 DIAGNOSIS — R059 Cough, unspecified: Secondary | ICD-10-CM | POA: Diagnosis present

## 2020-12-20 DIAGNOSIS — Z86711 Personal history of pulmonary embolism: Secondary | ICD-10-CM | POA: Diagnosis not present

## 2020-12-20 DIAGNOSIS — Z87442 Personal history of urinary calculi: Secondary | ICD-10-CM | POA: Insufficient documentation

## 2020-12-20 DIAGNOSIS — E119 Type 2 diabetes mellitus without complications: Secondary | ICD-10-CM

## 2020-12-20 LAB — POCT URINALYSIS DIPSTICK, ED / UC
Bilirubin Urine: NEGATIVE
Glucose, UA: NEGATIVE mg/dL
Ketones, ur: NEGATIVE mg/dL
Leukocytes,Ua: NEGATIVE
Nitrite: NEGATIVE
Protein, ur: NEGATIVE mg/dL
Specific Gravity, Urine: 1.02 (ref 1.005–1.030)
Urobilinogen, UA: 0.2 mg/dL (ref 0.0–1.0)
pH: 5.5 (ref 5.0–8.0)

## 2020-12-20 MED ORDER — PROMETHAZINE-DM 6.25-15 MG/5ML PO SYRP: 5.0000 mL | ORAL_SOLUTION | Freq: Every evening | ORAL | 0 refills | Status: DC | PRN

## 2020-12-20 MED ORDER — BENZONATATE 100 MG PO CAPS: 100.0000 mg | ORAL_CAPSULE | Freq: Three times a day (TID) | ORAL | 0 refills | Status: DC | PRN

## 2020-12-20 NOTE — ED Triage Notes (Signed)
Pt presents with congestion and fatigue X 3 days.

## 2020-12-20 NOTE — Discharge Instructions (Addendum)
We will notify you of your COVID-19 test results as they arrive and may take between 24 to 48 hours.  I encourage you to sign up for MyChart if you have not already done so as this can be the easiest way for [place] to communicate results to you online or through a phone app.  In the meantime, if you develop worsening symptoms including fever, chest pain, shortness of breath despite our current treatment plan then please report to the emergency room as this may be a sign of worsening status from possible COVID-19 infection.  Otherwise, we will manage this as a viral syndrome. For sore throat or cough try using a honey-based tea. Use 3 teaspoons of honey with juice squeezed from half lemon. Place shaved pieces of ginger into 1/2-1 cup of water and warm over stove top. Then mix the ingredients and repeat every 4 hours as needed. Please take Tylenol '500mg'$ -'650mg'$  every 6 hours for aches and pains, fevers. Hydrate very well with at least 2 liters of water. Eat light meals such as soups to replenish electrolytes and soft fruits, veggies.

## 2020-12-20 NOTE — ED Provider Notes (Addendum)
[facility]   MRN: [id number] DOB: 10-26-1960  Subjective:   Evan Leonard is a 60 y.o. male presenting for 3-day history of acute onset fatigue, sinus congestion, cough that elicits chest pain and shortness of breath.  Patient does have a history of pulmonary embolism and is on Eliquis for this.  Would also like to have his urine checked as he had very dark urine a few days ago.  Has a history of renal stones.  Denies any active chest pain or shortness of breath, cough is generally mild.  He is not fully vaccinated against COVID-19, wants to be checked for that as well.  Patient is not a smoker, no alcohol or drug use.  Of note, I did see patient 11/18/2020 and treated him for community-acquired pneumonia given his x-ray findings.  He was to have repeat x-ray in 4 weeks which she is due for now.  His COVID testing was negative then.  +10 months ago.  No current facility-administered medications for this encounter.  Current Outpatient Medications:    ACCU-CHEK GUIDE test strip, USE AS DIRECTED UP TO 4 TIMES DAILY, Disp: 100 strip, Rfl: 3   azithromycin (ZITHROMAX) 250 MG tablet, Start with 2 tablets today, then 1 daily thereafter., Disp: 6 tablet, Rfl: 0   blood glucose meter kit and supplies KIT, Dispense based on patient and insurance preference. Use up to four times daily as directed. E11.65, Disp: 1 each, Rfl: 0   ciprofloxacin (CIPRO) 500 MG tablet, Take 1 tablet (500 mg total) by mouth every 12 (twelve) hours., Disp: 20 tablet, Rfl: 0   ELIQUIS 5 MG TABS tablet, TAKE 1 TABLET BY MOUTH TWICE A DAY, Disp: 60 tablet, Rfl: 5   empagliflozin (JARDIANCE) 10 MG TABS tablet, Take 1 tablet (10 mg total) by mouth daily., Disp: 90 tablet, Rfl: 0   furosemide (LASIX) 20 MG tablet, Take 1 tablet (20 mg total) by mouth daily for 3 days., Disp: 3 tablet, Rfl: 0   metFORMIN (GLUCOPHAGE XR) 500 MG 24 hr tablet, Take 2 tablets (1,000 mg total) by mouth in the morning and at bedtime.,  Disp: 180 tablet, Rfl: 3   Multiple Vitamin (MULTIVITAMIN WITH MINERALS) TABS tablet, Take 1 tablet by mouth daily. , Disp: , Rfl:    promethazine-dextromethorphan (PROMETHAZINE-DM) 6.25-15 MG/5ML syrup, Take 5 mLs by mouth at bedtime as needed for cough., Disp: 100 mL, Rfl: 0   rosuvastatin (CRESTOR) 40 MG tablet, Take 1 tablet (40 mg total) by mouth 3 (three) times a week., Disp: 39 tablet, Rfl: 3   Semaglutide, 1 MG/DOSE, (OZEMPIC, 1 MG/DOSE,) 2 MG/1.5ML SOPN, Inject 1 mg into the skin once a week., Disp: 3 mL, Rfl: 5   sildenafil (REVATIO) 20 MG tablet, Take 2-5 tablets as needed once every 48 hours for erectile dysfunction, Disp: 30 tablet, Rfl: 3   triamcinolone ointment (KENALOG) 0.5 %, Apply 1 application topically 2 (two) times daily. For 7-14 days maximum, Disp: 60 g, Rfl: 1   No Known Allergies  Past Medical History:  Diagnosis Date   Diabetes mellitus, type II ([place]) 02/2013   Diabetic ulcer of right foot due to type 2 diabetes mellitus ([place]) 03/21/2018   DVT (deep venous thrombosis) ([place]) 05/04/2012   LLE   Exertional dyspnea 05/04/2012   "isolated episode" (05/05/2012)   Hepatic steatosis    History of chickenpox    Hypertension 03/21/2018   Obesity, Class III, BMI 40-49.9 (morbid obesity) ([place]) 03/21/2018   OSA on  CPAP    Peripheral vascular disease (HCC)    varicose veins   Pulmonary embolism ([place]) 05/04/2012   bilaterally   Pyelonephritis 04/2017   Small bowel obstruction ([place]) 09/06/2015   Varicose veins      Past Surgical History:  Procedure Laterality Date   CYSTOSCOPY W/ URETERAL STENT PLACEMENT Right 05/12/2017   Procedure: CYSTOSCOPY WITH RETROGRADE PYELOGRAM/URETERAL STENT PLACEMENT;  Surgeon: [name], MD;  Location: [facility];  Service: Urology;  Laterality: Right;   CYSTOSCOPY/URETEROSCOPY/HOLMIUM LASER/STENT PLACEMENT Right 07/29/2017   Procedure: CYSTOSCOPY RIGHT URETEROSCOPY HOLMIUM LASER STENT EXCHANGE;  Surgeon: [name], MD;  [facility];   Service: Urology;  Laterality: Right;   HERNIA REPAIR     2015   LAPAROTOMY N/A 12/13/2013   Procedure: EXPLORATORY LAPAROTOMY Repair ventral hernia, without mesh, partial omentectomy;  Surgeon: Gwenyth Ober, MD;  Location: [facility];  Service: General;  Laterality: N/A;   VENTRAL HERNIA REPAIR N/A 04/26/2017   Procedure: INCARCERATED HERNIA REPAIR VENTRAL ADULT;  Surgeon: Georganna Skeans, MD;  Location: [facility];  Service: General;  Laterality: N/A;    Family History  Problem Relation Age of Onset   Diabetes Mother        died age 89- also bleeding ulcer   Hypertension Mother    Heart failure Father        in his 77s (patient was 21). day before surgery planned   Other Maternal Grandmother        states natural causes all grandparents    Social History   Tobacco Use   Smoking status: Never   Smokeless tobacco: Never  Vaping Use   Vaping Use: Never used  Substance Use Topics   Alcohol use: No    Alcohol/week: 0.0 standard drinks   Drug use: No    ROS   Objective:   Vitals: BP 126/85 (BP Location: Left Arm)   Pulse (!) 101   Temp 98.8 F (37.1 C) (Oral)   Resp 20   SpO2 96%   Physical Exam Constitutional:      General: He is not in acute distress.    Appearance: Normal appearance. He is well-developed. He is obese. He is not ill-appearing, toxic-appearing or diaphoretic.  HENT:     Head: Normocephalic and atraumatic.     Right Ear: External ear normal.     Left Ear: External ear normal.     Nose: Nose normal.     Mouth/Throat:     Mouth: Mucous membranes are moist.     Pharynx: Oropharynx is clear.  Eyes:     General: No scleral icterus.    Extraocular Movements: Extraocular movements intact.     Pupils: Pupils are equal, round, and reactive to light.  Cardiovascular:     Rate and Rhythm: Normal rate and regular rhythm.     Heart sounds: Normal heart sounds. No murmur heard.   No friction rub. No gallop.  Pulmonary:     Effort: Pulmonary effort is normal. No  respiratory distress.     Breath sounds: No stridor. No wheezing, rhonchi or rales.     Comments: Lung sounds no fully audible given body habitus. Neurological:     Mental Status: He is alert and oriented to person, place, and time.  Psychiatric:        Mood and Affect: Mood normal.        Behavior: Behavior normal.        Thought Content: Thought content normal.    Results  for orders placed or performed during the hospital encounter of 12/20/20 (from the past 24 hour(s))  POC Urinalysis dipstick     Status: Abnormal   Collection Time: 12/20/20  5:27 PM  Result Value Ref Range   Glucose, UA NEGATIVE NEGATIVE mg/dL   Bilirubin Urine NEGATIVE NEGATIVE   Ketones, ur NEGATIVE NEGATIVE mg/dL   Specific Gravity, Urine 1.020 1.005 - 1.030   Hgb urine dipstick LARGE (A) NEGATIVE   pH 5.5 5.0 - 8.0   Protein, ur NEGATIVE NEGATIVE mg/dL   Urobilinogen, UA 0.2 0.0 - 1.0 mg/dL   Nitrite NEGATIVE NEGATIVE   Leukocytes,Ua NEGATIVE NEGATIVE    DG Chest 2 View  Result Date: 12/20/2020 CLINICAL DATA:  60 year old male with cough and chest pain. EXAM: CHEST - 2 VIEW COMPARISON:  Chest radiograph dated 11/18/2020. FINDINGS: Left lung base linear atelectasis/scarring. No focal consolidation, pleural effusion, or pneumothorax. The cardiac silhouette is within limits. No acute osseous pathology. IMPRESSION: No active cardiopulmonary disease. Electronically Signed   By: Anner Crete M.D.   On: 12/20/2020 17:36     Assessment and Plan :   PDMP not reviewed this encounter.  1. Viral URI with cough   2. History of pneumonia   3. History of pulmonary embolism   4. History of renal stone     Will manage for viral illness such as viral URI, viral syndrome, viral rhinitis, COVID-19. Counseled patient on nature of COVID-19 including modes of transmission, diagnostic testing, management and supportive care.  Offered scripts for symptomatic relief. COVID 19 testing is pending. Previous pneumonia is  cleared, recommended follow up with urology for suspected recurrent renal stone. Counseled patient on potential for adverse effects with medications prescribed/recommended today, ER and return-to-clinic precautions discussed, patient verbalized understanding.     Jaynee Eagles, [place] 12/20/20 1812

## 2020-12-21 LAB — SARS CORONAVIRUS 2 (TAT 6-24 HRS): SARS Coronavirus 2: POSITIVE — AB

## 2020-12-22 ENCOUNTER — Ambulatory Visit: Payer: No Typology Code available for payment source | Admitting: Family Medicine

## 2021-01-01 ENCOUNTER — Other Ambulatory Visit: Payer: Self-pay

## 2021-01-01 ENCOUNTER — Emergency Department (HOSPITAL_COMMUNITY): Payer: PRIVATE HEALTH INSURANCE

## 2021-01-01 ENCOUNTER — Encounter (HOSPITAL_COMMUNITY): Payer: Self-pay | Admitting: Emergency Medicine

## 2021-01-01 ENCOUNTER — Observation Stay (HOSPITAL_COMMUNITY): Payer: PRIVATE HEALTH INSURANCE

## 2021-01-01 ENCOUNTER — Inpatient Hospital Stay (HOSPITAL_COMMUNITY)
Admission: EM | Admit: 2021-01-01 | Discharge: 2021-01-03 | DRG: 247 | Disposition: A | Discharge status: Home / Self Care | Payer: PRIVATE HEALTH INSURANCE | Attending: Internal Medicine | Admitting: Internal Medicine

## 2021-01-01 DIAGNOSIS — I259 Chronic ischemic heart disease, unspecified: Secondary | ICD-10-CM

## 2021-01-01 DIAGNOSIS — Z7984 Long term (current) use of oral hypoglycemic drugs: Secondary | ICD-10-CM

## 2021-01-01 DIAGNOSIS — S2232XA Fracture of one rib, left side, initial encounter for closed fracture: Secondary | ICD-10-CM | POA: Diagnosis present

## 2021-01-01 DIAGNOSIS — Z6841 Body Mass Index (BMI) 40.0 and over, adult: Secondary | ICD-10-CM

## 2021-01-01 DIAGNOSIS — I10 Essential (primary) hypertension: Secondary | ICD-10-CM | POA: Diagnosis present

## 2021-01-01 DIAGNOSIS — G4733 Obstructive sleep apnea (adult) (pediatric): Secondary | ICD-10-CM | POA: Diagnosis present

## 2021-01-01 DIAGNOSIS — Z8249 Family history of ischemic heart disease and other diseases of the circulatory system: Secondary | ICD-10-CM

## 2021-01-01 DIAGNOSIS — R0600 Dyspnea, unspecified: Secondary | ICD-10-CM

## 2021-01-01 DIAGNOSIS — Z833 Family history of diabetes mellitus: Secondary | ICD-10-CM

## 2021-01-01 DIAGNOSIS — Z79899 Other long term (current) drug therapy: Secondary | ICD-10-CM

## 2021-01-01 DIAGNOSIS — R0609 Other forms of dyspnea: Secondary | ICD-10-CM

## 2021-01-01 DIAGNOSIS — K760 Fatty (change of) liver, not elsewhere classified: Secondary | ICD-10-CM | POA: Diagnosis present

## 2021-01-01 DIAGNOSIS — I25110 Atherosclerotic heart disease of native coronary artery with unstable angina pectoris: Secondary | ICD-10-CM | POA: Diagnosis present

## 2021-01-01 DIAGNOSIS — I214 Non-ST elevation (NSTEMI) myocardial infarction: Secondary | ICD-10-CM | POA: Diagnosis not present

## 2021-01-01 DIAGNOSIS — W19XXXA Unspecified fall, initial encounter: Secondary | ICD-10-CM | POA: Diagnosis present

## 2021-01-01 DIAGNOSIS — E1165 Type 2 diabetes mellitus with hyperglycemia: Secondary | ICD-10-CM | POA: Diagnosis present

## 2021-01-01 DIAGNOSIS — Z86718 Personal history of other venous thrombosis and embolism: Secondary | ICD-10-CM

## 2021-01-01 DIAGNOSIS — R079 Chest pain, unspecified: Secondary | ICD-10-CM | POA: Diagnosis present

## 2021-01-01 DIAGNOSIS — Z955 Presence of coronary angioplasty implant and graft: Secondary | ICD-10-CM

## 2021-01-01 DIAGNOSIS — I82439 Acute embolism and thrombosis of unspecified popliteal vein: Secondary | ICD-10-CM | POA: Diagnosis present

## 2021-01-01 DIAGNOSIS — Z86711 Personal history of pulmonary embolism: Secondary | ICD-10-CM

## 2021-01-01 DIAGNOSIS — Z28311 Partially vaccinated for covid-19: Secondary | ICD-10-CM

## 2021-01-01 DIAGNOSIS — Z7901 Long term (current) use of anticoagulants: Secondary | ICD-10-CM

## 2021-01-01 DIAGNOSIS — I2699 Other pulmonary embolism without acute cor pulmonale: Secondary | ICD-10-CM | POA: Diagnosis present

## 2021-01-01 DIAGNOSIS — Z8616 Personal history of COVID-19: Secondary | ICD-10-CM

## 2021-01-01 DIAGNOSIS — E1151 Type 2 diabetes mellitus with diabetic peripheral angiopathy without gangrene: Secondary | ICD-10-CM | POA: Diagnosis present

## 2021-01-01 DIAGNOSIS — E785 Hyperlipidemia, unspecified: Secondary | ICD-10-CM | POA: Diagnosis present

## 2021-01-01 LAB — BASIC METABOLIC PANEL
Anion gap: 12 (ref 5–15)
BUN: 18 mg/dL (ref 8–23)
CO2: 19 mmol/L — ABNORMAL LOW (ref 22–32)
Calcium: 9.5 mg/dL (ref 8.9–10.3)
Chloride: 105 mmol/L (ref 98–111)
Creatinine, Ser: 1.19 mg/dL (ref 0.61–1.24)
GFR, Estimated: >60 mL/min (ref >60)
Glucose, Bld: 245 mg/dL — ABNORMAL HIGH (ref 70–99)
Potassium: 3.9 mmol/L (ref 3.5–5.1)
Sodium: 136 mmol/L (ref 135–145)

## 2021-01-01 LAB — TROPONIN I (HIGH SENSITIVITY)
Troponin I (High Sensitivity): 17 ng/L (ref <18)
Troponin I (High Sensitivity): 56 ng/L — ABNORMAL HIGH (ref <18)
Troponin I (High Sensitivity): 361 ng/L (ref <18)
Troponin I (High Sensitivity): 1847 ng/L (ref <18)

## 2021-01-01 LAB — CBC
HCT: 46.4 % (ref 39.0–52.0)
Hemoglobin: 14.7 g/dL (ref 13.0–17.0)
MCH: 29.6 pg (ref 26.0–34.0)
MCHC: 31.7 g/dL (ref 30.0–36.0)
MCV: 93.4 fL (ref 80.0–100.0)
Platelets: 218 10*3/uL (ref 150–400)
RBC: 4.97 MIL/uL (ref 4.22–5.81)
RDW: 13.9 % (ref 11.5–15.5)
WBC: 8.5 10*3/uL (ref 4.0–10.5)
nRBC: 0 % (ref 0.0–0.2)

## 2021-01-01 LAB — APTT: aPTT: 29 seconds (ref 24–36)

## 2021-01-01 LAB — PROTIME-INR
INR: 1.1 (ref 0.8–1.2)
Prothrombin Time: 14.5 seconds (ref 11.4–15.2)

## 2021-01-01 LAB — HEMOGLOBIN A1C
Hgb A1c MFr Bld: 9.5 % — ABNORMAL HIGH (ref 4.8–5.6)
Mean Plasma Glucose: 225.95 mg/dL

## 2021-01-01 LAB — C-REACTIVE PROTEIN: CRP: 0.7 mg/dL (ref <1.0)

## 2021-01-01 LAB — ECHOCARDIOGRAM LIMITED
Area-P 1/2: 2.95 cm2
Height: 74 in
S' Lateral: 2.8 cm
Weight: 5291.04 oz

## 2021-01-01 LAB — GLUCOSE, CAPILLARY: Glucose-Capillary: 153 mg/dL — ABNORMAL HIGH (ref 70–99)

## 2021-01-01 LAB — HIV ANTIBODY (ROUTINE TESTING W REFLEX): HIV Screen 4th Generation wRfx: NONREACTIVE

## 2021-01-01 LAB — HEPARIN LEVEL (UNFRACTIONATED): Heparin Unfractionated: 0.99 IU/mL — ABNORMAL HIGH (ref 0.30–0.70)

## 2021-01-01 LAB — BRAIN NATRIURETIC PEPTIDE: B Natriuretic Peptide: 634.1 pg/mL — ABNORMAL HIGH (ref 0.0–100.0)

## 2021-01-01 LAB — SEDIMENTATION RATE: Sed Rate: 21 mm/h — ABNORMAL HIGH (ref 0–16)

## 2021-01-01 LAB — CBG MONITORING, ED: Glucose-Capillary: 112 mg/dL — ABNORMAL HIGH (ref 70–99)

## 2021-01-01 MED ORDER — SODIUM CHLORIDE 0.9 % WEIGHT BASED INFUSION
1.0000 mL/kg/h | INTRAVENOUS | Status: DC
  Administered 2021-01-02 (×2): 1 mL/kg/h via INTRAVENOUS

## 2021-01-01 MED ORDER — ASPIRIN EC 81 MG PO TBEC
81.0000 mg | DELAYED_RELEASE_TABLET | Freq: Every day | ORAL | Status: DC
  Administered 2021-01-03: 81 mg via ORAL
  Filled 2021-01-01 (×2): qty 1

## 2021-01-01 MED ORDER — SODIUM CHLORIDE 0.9% FLUSH: 3.0000 mL | INTRAVENOUS | Status: DC | PRN

## 2021-01-01 MED ORDER — SODIUM CHLORIDE 0.9 % IV SOLN: 250.0000 mL | INTRAVENOUS | Status: DC | PRN

## 2021-01-01 MED ORDER — SODIUM CHLORIDE 0.9 % WEIGHT BASED INFUSION
3.0000 mL/kg/h | INTRAVENOUS | Status: DC
  Administered 2021-01-02: 3 mL/kg/h via INTRAVENOUS

## 2021-01-01 MED ORDER — HEPARIN BOLUS VIA INFUSION
4000.0000 [IU] | Freq: Once | INTRAVENOUS | Status: AC
  Administered 2021-01-01: 4000 [IU] via INTRAVENOUS
  Filled 2021-01-01: qty 4000

## 2021-01-01 MED ORDER — ACETAMINOPHEN 325 MG PO TABS: 650.0000 mg | ORAL_TABLET | Freq: Four times a day (QID) | ORAL | Status: DC | PRN

## 2021-01-01 MED ORDER — GUAIFENESIN ER 600 MG PO TB12: 600.0000 mg | ORAL_TABLET | Freq: Two times a day (BID) | ORAL | Status: DC

## 2021-01-01 MED ORDER — IOHEXOL 350 MG/ML SOLN
100.0000 mL | Freq: Once | INTRAVENOUS | Status: AC | PRN
  Administered 2021-01-01: 100 mL via INTRAVENOUS

## 2021-01-01 MED ORDER — ROSUVASTATIN CALCIUM 20 MG PO TABS
40.0000 mg | ORAL_TABLET | ORAL | Status: DC
  Administered 2021-01-03: 40 mg via ORAL
  Filled 2021-01-01: qty 2

## 2021-01-01 MED ORDER — SODIUM CHLORIDE 0.9% FLUSH
3.0000 mL | Freq: Two times a day (BID) | INTRAVENOUS | Status: DC
  Administered 2021-01-03: 3 mL via INTRAVENOUS

## 2021-01-01 MED ORDER — GUAIFENESIN-DM 100-10 MG/5ML PO SYRP: 5.0000 mL | ORAL_SOLUTION | ORAL | Status: DC | PRN

## 2021-01-01 MED ORDER — HYDROCODONE-ACETAMINOPHEN 5-325 MG PO TABS: 1.0000 | ORAL_TABLET | ORAL | Status: DC | PRN

## 2021-01-01 MED ORDER — ACETAMINOPHEN 650 MG RE SUPP: 650.0000 mg | Freq: Four times a day (QID) | RECTAL | Status: DC | PRN

## 2021-01-01 MED ORDER — ASPIRIN 81 MG PO CHEW
324.0000 mg | CHEWABLE_TABLET | Freq: Once | ORAL | Status: AC
  Administered 2021-01-01: 324 mg via ORAL
  Filled 2021-01-01: qty 4

## 2021-01-01 MED ORDER — ADULT MULTIVITAMIN W/MINERALS CH
1.0000 | ORAL_TABLET | Freq: Every day | ORAL | Status: DC
  Administered 2021-01-03: 1 via ORAL
  Filled 2021-01-01: qty 1

## 2021-01-01 MED ORDER — APIXABAN 5 MG PO TABS
5.0000 mg | ORAL_TABLET | Freq: Two times a day (BID) | ORAL | Status: DC
  Administered 2021-01-02: 5 mg via ORAL
  Filled 2021-01-01: qty 1

## 2021-01-01 MED ORDER — SODIUM CHLORIDE 0.9 % IV SOLN: INTRAVENOUS | Status: AC

## 2021-01-01 MED ORDER — BENZONATATE 100 MG PO CAPS: 100.0000 mg | ORAL_CAPSULE | Freq: Three times a day (TID) | ORAL | Status: DC | PRN

## 2021-01-01 MED ORDER — ATORVASTATIN CALCIUM 80 MG PO TABS
80.0000 mg | ORAL_TABLET | Freq: Every day | ORAL | Status: DC
  Administered 2021-01-01 – 2021-01-03 (×2): 80 mg via ORAL
  Filled 2021-01-01: qty 1

## 2021-01-01 MED ORDER — HEPARIN (PORCINE) 25000 UT/250ML-% IV SOLN
1600.0000 [IU]/h | INTRAVENOUS | Status: DC
  Administered 2021-01-01: 1600 [IU]/h via INTRAVENOUS
  Filled 2021-01-01: qty 250

## 2021-01-01 MED ORDER — INSULIN ASPART 100 UNIT/ML IJ SOLN
0.0000 [IU] | Freq: Three times a day (TID) | INTRAMUSCULAR | Status: DC
  Administered 2021-01-03: 3 [IU] via SUBCUTANEOUS

## 2021-01-01 MED ORDER — ASPIRIN 81 MG PO CHEW
81.0000 mg | CHEWABLE_TABLET | ORAL | Status: AC
  Administered 2021-01-02: 81 mg via ORAL
  Filled 2021-01-01: qty 1

## 2021-01-01 NOTE — Progress Notes (Signed)
ANTICOAGULATION CONSULT NOTE - Initial Consult  Pharmacy Consult for Heparin Indication: chest pain/ACS  No Known Allergies  Patient Measurements: Height: '6\' 2"'$  (188 cm) Weight: (!) 150 kg (330 lb 11 oz) IBW/kg (Calculated) : 82.2 Heparin Dosing Weight: 116.9  Vital Signs: Temp: 98.2 F (36.8 C) (08/08 0811) Temp Source: Oral (08/08 0811) BP: 116/75 (08/08 1615) Pulse Rate: 82 (08/08 1615)  Labs: Recent Labs    01/01/21 0645 01/01/21 1056 01/01/21 1311  HGB 14.7  --   --   HCT 46.4  --   --   PLT 218  --   --   LABPROT 14.5  --   --   INR 1.1  --   --   CREATININE 1.19  --   --   TROPONINIHS 17 56* 361*    Estimated Creatinine Clearance: 100.8 mL/min (by C-G formula based on SCr of 1.19 mg/dL).   Medical History: Past Medical History:  Diagnosis Date   Diabetes mellitus, type II ([place]) 02/2013   Diabetic ulcer of right foot due to type 2 diabetes mellitus ([place]) 03/21/2018   DVT (deep venous thrombosis) ([place]) 05/04/2012   LLE   Exertional dyspnea 05/04/2012   "isolated episode" (05/05/2012)   Hepatic steatosis    History of chickenpox    Hypertension 03/21/2018   Obesity, Class III, BMI 40-49.9 (morbid obesity) ([place]) 03/21/2018   OSA on CPAP    Peripheral vascular disease (HCC)    varicose veins   Pulmonary embolism ([place]) 05/04/2012   bilaterally   Pyelonephritis 04/2017   Small bowel obstruction ([place]) 09/06/2015   Varicose veins     Medications:  (Not in a hospital admission)  Scheduled:   [START ON 01/02/2021] aspirin EC  81 mg Oral Daily   atorvastatin  80 mg Oral Daily   insulin aspart  0-15 Units Subcutaneous TID WC   Infusions:   sodium chloride 100 mL/hr at 01/01/21 1410    Assessment: 50 yom with history of uncontrolled diabetes mellitus, prior hypertension (currently not on antihypertensive), hyperlipidemia, morbid obesity, history of DVT and pulmonary embolism in 2013, on Eliquis for anticoagulation and obstructive sleep apnea. Patient  presented to ED with chest pain. Pharmacy consulted for heparin infusion in the setting of possible ACS.   Discussed with cardiology, who is requesting ACS dosing for heparin. Patient taking apixaban prior to arrival for history of VTE. Last dose is 12/31/20.  CBC is stable; plt 218  Goal of Therapy:  Heparin level 0.3-0.7 units/ml aPTT 66-102 seconds Monitor platelets by anticoagulation protocol: Yes   Plan:  Give 4000 units bolus x 1 Start heparin infusion at 1600 units/hr Check anti-Xa/aPTT level in 6-8 hours and daily while on heparin Will need to check both anti-Xa and aPTT levels until they are correlated Continue to monitor H&H and platelets  Lorelei Pont, PharmD, BCPS 01/01/2021 4:49 PM ED Clinical Pharmacist -  [phone]

## 2021-01-01 NOTE — Progress Notes (Signed)
  Echocardiogram 2D Echocardiogram has been performed.  Evan Leonard 01/01/2021, 5:02 PM

## 2021-01-01 NOTE — ED Notes (Signed)
Relayed critical troponin value to Dr. Florene Glen.

## 2021-01-01 NOTE — Progress Notes (Signed)
RT set CPAP for pt and was placing mask on, pt stated he didn't take he couldn't handle the mask on and pull the mask off and sated he would do without CPAP tonight.

## 2021-01-01 NOTE — H&P (Signed)
History and Physical    Evan Leonard JYN:[id number] DOB: 01/06/1961 DOA: 01/01/2021  PCP: [name], MD Consultants:  none Patient coming from:  Home - lives with alone   Chief Complaint: chest pain and dyspnea   HPI: Evan Leonard is a 60 y.o. male with medical history significant of T2DM, previous PE on eliquis, OSA on cpap, chronic venous insufficiency who presented with worsening dyspnea and chest tightness.  He recently had covid-19 12 days ago with a mild case and lingering cough. No anti viral medication was given. He states he woke up this morning and was going to go back to work, but noticed he was short of breath. He went to take his shower and the shortness of breath continued to get worse with associated substernal chest tightness and shoulder tightness. Rated 7/10.  These episodes would last about 2-30 minutes. NO radiation of the tightness. Nothing made it better or worse. Not worse with exertion. Has associated diaphoresis and fatigue. No family history of known heart disease. He does not smoke or drink. He has type 2 diabetes. He denies any lower extremity edema from baseline or weight gain. No orthopnea. He has had decreased PO intake. Denies any headaches, vision changes, palpitations, abdominal pain, N/V/D. Has been a little light headed today. No chest pain at this time.    ED Course: vitals: afebrile, bp: 122/93, HR: 100, RR: 20, oxygen: 96% RA.  Pertinent labs: troponin 17--->56, CTA negative for acute PE but has a subacute appearing fx of the left lateral 10th rib. CXR negative for acute process.  Asked to admit for ACS rule out.   Review of Systems: As per HPI; otherwise review of systems reviewed and negative.   Ambulatory Status:  Ambulates without assistance   Past Medical History:  Diagnosis Date   Diabetes mellitus, type II ([place]) 02/2013   Diabetic ulcer of right foot due to type 2 diabetes mellitus ([place]) 03/21/2018   DVT (deep venous  thrombosis) ([place]) 05/04/2012   LLE   Exertional dyspnea 05/04/2012   "isolated episode" (05/05/2012)   Hepatic steatosis    History of chickenpox    Hypertension 03/21/2018   Obesity, Class III, BMI 40-49.9 (morbid obesity) ([place]) 03/21/2018   OSA on CPAP    Peripheral vascular disease (HCC)    varicose veins   Pulmonary embolism ([place]) 05/04/2012   bilaterally   Pyelonephritis 04/2017   Small bowel obstruction ([place]) 09/06/2015   Varicose veins     Past Surgical History:  Procedure Laterality Date   CYSTOSCOPY W/ URETERAL STENT PLACEMENT Right 05/12/2017   Procedure: CYSTOSCOPY WITH RETROGRADE PYELOGRAM/URETERAL STENT PLACEMENT;  Surgeon: [name], MD;  Location: [facility];  Service: Urology;  Laterality: Right;   CYSTOSCOPY/URETEROSCOPY/HOLMIUM LASER/STENT PLACEMENT Right 07/29/2017   Procedure: CYSTOSCOPY RIGHT URETEROSCOPY HOLMIUM LASER STENT EXCHANGE;  Surgeon: [name], MD;  Location: [facility];  Service: Urology;  Laterality: Right;   HERNIA REPAIR     2015   LAPAROTOMY N/A 12/13/2013   Procedure: EXPLORATORY LAPAROTOMY Repair ventral hernia, without mesh, partial omentectomy;  Surgeon: Gwenyth Ober, MD;  Location: [facility];  Service: General;  Laterality: N/A;   VENTRAL HERNIA REPAIR N/A 04/26/2017   Procedure: INCARCERATED HERNIA REPAIR VENTRAL ADULT;  Surgeon: Georganna Skeans, MD;  Location: [facility];  Service: General;  Laterality: N/A;    Social History   Socioeconomic History   Marital status: Single    Spouse name: Not on file   Number of children: Not  on file   Years of education: Not on file   Highest education level: Not on file  Occupational History   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 Lives alone. Friends that live close. No pets.       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 day before surgery planned   Other Maternal Grandmother        states natural causes all grandparents    Prior to Admission medications   Medication Sig Start Date End Date Taking? Authorizing Provider  ACCU-CHEK GUIDE test strip USE AS DIRECTED UP TO 4 TIMES DAILY Patient taking differently: 1 each by Other route 4 (four) times daily. 12/30/19  Yes [name], MD  benzonatate (TESSALON) 100 MG capsule Take 1-2 capsules (100-200 mg total) by mouth 3 (three) times daily as needed. 12/20/20  Yes Jaynee Eagles, PA-C  blood glucose meter kit and supplies KIT Dispense based on patient and insurance preference. Use up to four times daily as directed. E11.65 Patient taking differently: Inject 1 each into the skin See admin instructions. Dispense based on patient and insurance preference. Use up to four times daily as directed. E11.65 12/09/19  Yes [name], MD  ELIQUIS 5 MG TABS tablet TAKE 1 TABLET BY MOUTH TWICE A DAY Patient taking differently: Take 5 mg by mouth 2 (two) times daily. 10/30/20  Yes [name], MD  empagliflozin (JARDIANCE) 10 MG TABS tablet Take 1 tablet (10 mg total) by mouth daily. 03/01/20  Yes Inda Coke, PA  metFORMIN (GLUCOPHAGE  XR) 500 MG 24 hr tablet Take 2 tablets (1,000 mg total) by mouth in the morning and at bedtime. 12/11/20  Yes [name], MD  Multiple Vitamin (MULTIVITAMIN WITH MINERALS) TABS tablet Take 1 tablet by mouth daily.    Yes [provider]  promethazine-dextromethorphan (PROMETHAZINE-DM) 6.25-15 MG/5ML syrup Take 5 mLs by mouth at bedtime as needed for cough. 12/20/20  Yes Jaynee Eagles, PA-C  rosuvastatin (CRESTOR) 40 MG tablet Take 1 tablet (40 mg total) by mouth 3 (three) times a week. 07/24/20  Yes [name], MD  sildenafil (REVATIO) 20 MG tablet Take 2-5 tablets as needed once every 48 hours for erectile dysfunction 12/27/19  Yes [name], MD  triamcinolone ointment (KENALOG) 0.5 % Apply 1 application topically 2 (two) times daily. For  7-14 days maximum 12/27/19  Yes [name], MD  ciprofloxacin (CIPRO) 500 MG tablet Take 1 tablet (500 mg total) by mouth every 12 (twelve) hours. Patient not taking: No sig reported 09/10/20   Jaynee Eagles, PA-C  Semaglutide, 1 MG/DOSE, (OZEMPIC, 1 MG/DOSE,) 2 MG/1.5ML SOPN Inject 1 mg into the skin once a week. Patient not taking: No sig reported 07/25/20   [name], MD    Physical Exam: Vitals:   01/01/21 0900 01/01/21 0915 01/01/21 1045 01/01/21 1217  BP: 113/81 118/78 108/74 118/88  Pulse: 78 79 78 80  Resp:  '16 15 20  ' Temp:      TempSrc:      SpO2: 96% 94% 96% 97%  Weight:      Height:         General:  Appears calm and comfortable and is in NAD. Diaphoretic on his face  Eyes:  PERRL, EOMI, normal lids, iris ENT:  grossly normal hearing, lips & tongue, mmm; appropriate dentition Neck:  no LAD, masses or thyromegaly; no carotid bruits Cardiovascular:  RRR, no m/r/g. Bilateral LE edema to below knee, trace. No reproducible cardiac pain.  Respiratory:   CTA bilaterally with no wheezes/rales/rhonchi.  Normal respiratory effort. Abdomen:  soft, NT, ND, NABS Back:   normal alignment, no CVAT Skin:  no rash or  induration seen on limited exam. Erythema over left medial lower leg from recent fall.  Musculoskeletal:  grossly normal tone BUE/BLE, good ROM, no bony abnormality Lower extremity:  Limited foot exam with no ulcerations.  2+ distal pulses. NO TTP over his left lateral ribs  Psychiatric:  grossly normal mood and affect, speech fluent and appropriate, AOx3 Neurologic:  CN 2-12 grossly intact, moves all extremities in coordinated fashion, sensation intact    Radiological Exams on Admission: Independently reviewed - see discussion in A/P where applicable  DG Chest 2 View  Result Date: 01/01/2021 CLINICAL DATA:  60 year old male with shortness of breath and chest tightness this morning. Status post COVID-19. 2 weeks ago. EXAM: CHEST - 2 VIEW COMPARISON:  Chest radiograph 12/20/2020 and earlier. FINDINGS: Continued stable lung volumes and mediastinal contours. Visualized tracheal air column is within normal limits. No pneumothorax, pulmonary edema, pleural effusion or consolidation. Chronic mild left lung base atelectasis or scarring is stable since 2018. no acute pulmonary opacity. No acute osseous abnormality identified. Negative visible bowel gas pattern. IMPRESSION: No acute cardiopulmonary abnormality. Electronically Signed   By: Genevie Ann M.D.   On: 01/01/2021 07:26   CT Angio Chest PE W and/or Wo Contrast  Result Date: 01/01/2021 CLINICAL DATA:  60 year old male with chest pain, shortness of breath, recent COVID-19. History of DVT. EXAM: CT ANGIOGRAPHY CHEST WITH CONTRAST TECHNIQUE: Multidetector CT imaging of the chest was performed using the standard protocol during bolus administration of intravenous contrast. Multiplanar CT image reconstructions and MIPs were obtained to evaluate the vascular anatomy. CONTRAST:  175m OMNIPAQUE IOHEXOL 350 MG/ML SOLN COMPARISON:  Chest radiographs 0708 hours today. Prior chest CTA 03/23/2013. FINDINGS: Cardiovascular: Initially poor but ultimately excellent  contrast bolus timing in the pulmonary arterial tree after repeat bolus injection at 1109 hours. No focal filling defect identified in the pulmonary arteries to suggest acute pulmonary embolism. Calcified coronary artery atherosclerosis and/or stents (series 17, image 60). No cardiomegaly or pericardial effusion. Negative visible aorta on the initial contrast bolus images. Mediastinum/Nodes: Negative.  No mediastinal lymphadenopathy. Lungs/Pleura: Major airways are patent with mild atelectatic changes. No consolidation or pleural effusion. Mild mosaic  attenuation in both lungs appears compatible with mild atelectasis and gas trapping. No confluent or convincing inflammatory lung opacity. Upper Abdomen: Chronic hepatic steatosis. Negative visible gallbladder, spleen (chronic splenule), pancreas, adrenal glands, and bowel in the upper abdomen. Nephrolithiasis evident on series 6, image 170. No hydronephrosis is apparent. Musculoskeletal: Osteopenia. Subacute appearing fracture of the left lateral 10th rib on series 7, image 138 with partial healing. No acute osseous abnormality identified. Review of the MIP images confirms the above findings. IMPRESSION: 1. Negative for acute pulmonary embolus. No acute pulmonary finding aside from mild atelectasis and gas trapping. 2. Subacute appearing fracture of the left lateral 10th rib. 3. Calcified coronary artery atherosclerosis. 4. Chronic fatty liver disease. 5. Nephrolithiasis. Electronically Signed   By: Genevie Ann M.D.   On: 01/01/2021 11:31    EKG: Independently reviewed.  Sinus tachycardia with rate 120; nonspecific ST changes with no evidence of acute ischemia   Labs on Admission: I have personally reviewed the available labs and imaging studies at the time of the admission.  Pertinent labs:  Troponin: 17-->56    Assessment/Plan Principal Problem:   Chest pain Concern for pericarditis in setting of recent Covid-19 infection. Inflammatory markers and echo  are pending vs ACS in male with multiple risk factors including hx of HTN, uncontrolled DM, obesity.  Will continue to trend troponin and consult cardiology  Continue tele Continue eliquis Last lipid panel in 06/2020 showed LDL of 128. Repeat fasting panel in AM.   Active Problems:   Dyspnea Concern for more cardiac origin vs. sequelae from covid-19 CXR and CTA with no acute finding and satting 100% on room air with no increased work of breathing -will check BNP -inflammatory markers pending and echo pending  -per cardiology for chest pain w/u     Diabetes mellitus with hyperglycemia ([place]) Repeat a1c pending. Last a1c in 06/2020 and was 8.3 Hold metformin. Was on SGLT-2, but could not afford.  SSI and QAC and pm accuchecks     Obstructive sleep apnea -continue cpap at night     History of DVT of left distal popliteal vein and PE -continue eliquis   Subacute rib fracture -having no pain -likely secondary to fall onto left side a few weeks ago.   Body mass index is 42.46 kg/m.   Level of care: Telemetry Medical DVT prophylaxis:  eliquis Code Status:  Full - confirmed with patient Family Communication: None present; I spoke with the patient's sister: Izell Palisades, by telephone at the time of admission.[phone] Disposition Plan:  The patient is from: home  Anticipated d/c is to: home Requires inpatient hospitalization and is at significant risk of cardiac worsening, requires constant monitoring, assessment and MDM with specialists.   Patient is currently: acutely ill Consults called: cardiology   Admission status:  observation    Orma Flaming MD [facility]   How to contact the [facility] Attending or Consulting provider [place] or covering provider during after hours [place], for this patient?  Check the care team in [facility] and look for a) attending/consulting [facility] provider listed and b) the [facility] team listed Log into [email or web address] and use [place]'s universal password to  access. If you do not have the password, please contact the hospital operator. Locate the [facility] provider you are looking for under [facility] and page to a number that you can be directly reached. If you still have difficulty reaching the provider, please page the [facility] (Director on Call) for the Hospitalists listed  on amion for assistance.   01/01/2021, 1:35 PM

## 2021-01-01 NOTE — ED Triage Notes (Signed)
Patient reports chest tightness with SOB this morning and occasional dry cough , denies fever or chills .

## 2021-01-01 NOTE — ED Provider Notes (Signed)
[facility] EMERGENCY DEPARTMENT Provider Note   CSN: [id number] Arrival date & time: 01/01/21  6767     History Chief Complaint  Patient presents with   Chest Tightness/SOB     Evan Leonard is a 60 y.o. male.  HPI  60 year old male with a past medical history of diabetes, hypertension, hyperlipidemia, previous DVT and bilateral PE in 2013 on Eliquis presenting to the emergency department with exertional dyspnea.  Patient reports that around 2 weeks ago, he had a cough, fever, malaise, and nausea.  He was diagnosed with COVID-19 12 days ago.  Patient reports he recovered well from that illness, he only has a persistent cough.  He states that today, he acutely became short of breath.  He states that he did not have the shortness of breath during or following his COVID infection until today.  He reports that shortness of breath is worsened with exertion.  It is not exacerbated by lying flat.  He also reports some central chest pain, although it is sharp, nonradiating, constant, improved by standing up and stretching.  He denies any asymmetric leg swelling.  He notes that he has not missed any doses of his Eliquis and he is very careful about this.  Past Medical History:  Diagnosis Date   Diabetes mellitus, type II ([place]) 02/2013   Diabetic ulcer of right foot due to type 2 diabetes mellitus ([place]) 03/21/2018   DVT (deep venous thrombosis) ([place]) 05/04/2012   LLE   Exertional dyspnea 05/04/2012   "isolated episode" (05/05/2012)   Hepatic steatosis    History of chickenpox    Hypertension 03/21/2018   Obesity, Class III, BMI 40-49.9 (morbid obesity) ([place]) 03/21/2018   OSA on CPAP    Peripheral vascular disease (HCC)    varicose veins   Pulmonary embolism ([place]) 05/04/2012   bilaterally   Pyelonephritis 04/2017   Small bowel obstruction ([place]) 09/06/2015   Varicose veins     Patient Active Problem List   Diagnosis Date Noted   Chest pain 01/01/2021   Eczema  12/27/2019   Erectile dysfunction 12/27/2019   SBO (small bowel obstruction) ([place]) 08/20/2018   History of diabetic ulcer of foot 03/21/2018   Atypical chest pain 02/09/2018   Renal stone 05/12/2017   S/P repair of recurrent ventral hernia 04/26/2017   Chronic venous insufficiency 01/25/2015   Diabetes mellitus with hyperglycemia ([place]) 03/31/2013   Hyponatremia 03/23/2013   Pulmonary embolism ([place]) 05/05/2012   Morbid obesity with body mass index of 40.0-44.9 in adult ([place]) 05/05/2012   Obstructive sleep apnea 05/05/2012   DVT of left distal popliteal vein 05/05/2012    Past Surgical History:  Procedure Laterality Date   CYSTOSCOPY W/ URETERAL STENT PLACEMENT Right 05/12/2017   Procedure: CYSTOSCOPY WITH RETROGRADE PYELOGRAM/URETERAL STENT PLACEMENT;  Surgeon: [name], MD;  Location: [facility] [place];  Service: Urology;  Laterality: Right;   CYSTOSCOPY/URETEROSCOPY/HOLMIUM LASER/STENT PLACEMENT Right 07/29/2017   Procedure: CYSTOSCOPY RIGHT URETEROSCOPY HOLMIUM LASER STENT EXCHANGE;  Surgeon: [name], MD;  Location: [facility];  Service: Urology;  Laterality: Right;   HERNIA REPAIR     2015   LAPAROTOMY N/A 12/13/2013   Procedure: EXPLORATORY LAPAROTOMY Repair ventral hernia, without mesh, partial omentectomy;  Surgeon: Gwenyth Ober, MD;  Location: [facility];  Service: General;  Laterality: N/A;   VENTRAL HERNIA REPAIR N/A 04/26/2017   Procedure: INCARCERATED HERNIA REPAIR VENTRAL ADULT;  Surgeon: Georganna Skeans, MD;  Location: [facility];  Service: General;  Laterality: N/A;  Family History  Problem Relation Age of Onset   Diabetes Mother        died age 37- also bleeding ulcer   Hypertension Mother    Heart failure Father        in his 105s (patient was 34). day before surgery planned   Other Maternal Grandmother        states natural causes all grandparents    Social History   Tobacco Use   Smoking status: Never   Smokeless tobacco: Never  Vaping Use   Vaping Use: Never used   Substance Use Topics   Alcohol use: No    Alcohol/week: 0.0 standard drinks   Drug use: No    Home Medications Prior to Admission medications   Medication Sig Start Date End Date Taking? Authorizing Provider  ACCU-CHEK GUIDE test strip USE AS DIRECTED UP TO 4 TIMES DAILY Patient taking differently: 1 each by Other route 4 (four) times daily. 12/30/19  Yes [name], MD  benzonatate (TESSALON) 100 MG capsule Take 1-2 capsules (100-200 mg total) by mouth 3 (three) times daily as needed. 12/20/20  Yes Jaynee Eagles, PA-C  blood glucose meter kit and supplies KIT Dispense based on patient and insurance preference. Use up to four times daily as directed. E11.65 Patient taking differently: Inject 1 each into the skin See admin instructions. Dispense based on patient and insurance preference. Use up to four times daily as directed. E11.65 12/09/19  Yes [name], MD  ELIQUIS 5 MG TABS tablet TAKE 1 TABLET BY MOUTH TWICE A DAY Patient taking differently: Take 5 mg by mouth 2 (two) times daily. 10/30/20  Yes [name], MD  empagliflozin (JARDIANCE) 10 MG TABS tablet Take 1 tablet (10 mg total) by mouth daily. 03/01/20  Yes Inda Coke, PA  metFORMIN (GLUCOPHAGE XR) 500 MG 24 hr tablet Take 2 tablets (1,000 mg total) by mouth in the morning and at bedtime. 12/11/20  Yes [name], MD  Multiple Vitamin (MULTIVITAMIN WITH MINERALS) TABS tablet Take 1 tablet by mouth daily.    Yes [provider]  promethazine-dextromethorphan (PROMETHAZINE-DM) 6.25-15 MG/5ML syrup Take 5 mLs by mouth at bedtime as needed for cough. 12/20/20  Yes Jaynee Eagles, PA-C  rosuvastatin (CRESTOR) 40 MG tablet Take 1 tablet (40 mg total) by mouth 3 (three) times a week. 07/24/20  Yes [name], MD  sildenafil (REVATIO) 20 MG tablet Take 2-5 tablets as needed once every 48 hours for erectile dysfunction 12/27/19  Yes [name], MD  triamcinolone ointment (KENALOG) 0.5 % Apply 1  application topically 2 (two) times daily. For 7-14 days maximum 12/27/19  Yes [name], MD  ciprofloxacin (CIPRO) 500 MG tablet Take 1 tablet (500 mg total) by mouth every 12 (twelve) hours. Patient not taking: No sig reported 09/10/20   Jaynee Eagles, PA-C  Semaglutide, 1 MG/DOSE, (OZEMPIC, 1 MG/DOSE,) 2 MG/1.5ML SOPN Inject 1 mg into the skin once a week. Patient not taking: No sig reported 07/25/20   [name], MD    Allergies    Patient has no known allergies.  Review of Systems   Review of Systems  Constitutional:  Negative for chills and fever.  HENT:  Negative for ear pain and sore throat.   Eyes:  Negative for pain and visual disturbance.  Respiratory:  Positive for cough and shortness of breath.   Cardiovascular:  Negative for chest pain and palpitations.  Gastrointestinal:  Negative for abdominal pain  and vomiting.  Genitourinary:  Negative for dysuria and hematuria.  Musculoskeletal:  Negative for arthralgias and back pain.  Skin:  Negative for color change and rash.  Neurological:  Negative for seizures and syncope.  All other systems reviewed and are negative.  Physical Exam Updated Vital Signs BP 118/88   Pulse 80   Temp 98.2 F (36.8 C) (Oral)   Resp 20   Ht _0  (1.88 m)   Wt (!) 150 kg   SpO2 97%   BMI 42.46 kg/m   Physical Exam Vitals and nursing note reviewed.  Constitutional:      General: He is not in acute distress.    Appearance: Normal appearance. He is well-developed. He is obese. He is not ill-appearing or toxic-appearing.  HENT:     Head: Normocephalic and atraumatic.  Eyes:     Conjunctiva/sclera: Conjunctivae normal.  Cardiovascular:     Rate and Rhythm: Regular rhythm. Tachycardia present.     Heart sounds: No murmur heard. Pulmonary:     Effort: Pulmonary effort is normal. No respiratory distress.     Breath sounds: Normal breath sounds. No stridor. No wheezing or rales.  Abdominal:     Palpations: Abdomen is soft.      Tenderness: There is no abdominal tenderness. There is no guarding or rebound.  Musculoskeletal:     Cervical back: Normal range of motion and neck supple. No rigidity.  Skin:    General: Skin is warm and dry.  Neurological:     Mental Status: He is alert.    ED Results / Procedures / Treatments   Labs (all labs ordered are listed, but only abnormal results are displayed) Labs Reviewed  BASIC METABOLIC PANEL - Abnormal; Notable for the following components:      Result Value   CO2 19 (*)    Glucose, Bld 245 (*)    All other components within normal limits  TROPONIN I (HIGH SENSITIVITY) - Abnormal; Notable for the following components:   Troponin I (High Sensitivity) 56 (*)    All other components within normal limits  CBC  PROTIME-INR  BRAIN NATRIURETIC PEPTIDE  TROPONIN I (HIGH SENSITIVITY)  TROPONIN I (HIGH SENSITIVITY)    EKG EKG Interpretation  Date/Time:  Monday January 01 2021 06:46:23 EDT Ventricular Rate:  120 PR Interval:  156 QRS Duration: 84 QT Interval:  322 QTC Calculation: 455 R Axis:   49 Text Interpretation: Sinus tachycardia Otherwise normal ECG Confirmed by Carmin Muskrat [phone]) on 01/01/2021 8:13:41 AM  Radiology DG Chest 2 View  Result Date: 01/01/2021 CLINICAL DATA:  60 year old male with shortness of breath and chest tightness this morning. Status post COVID-19. 2 weeks ago. EXAM: CHEST - 2 VIEW COMPARISON:  Chest radiograph 12/20/2020 and earlier. FINDINGS: Continued stable lung volumes and mediastinal contours. Visualized tracheal air column is within normal limits. No pneumothorax, pulmonary edema, pleural effusion or consolidation. Chronic mild left lung base atelectasis or scarring is stable since 2018. no acute pulmonary opacity. No acute osseous abnormality identified. Negative visible bowel gas pattern. IMPRESSION: No acute cardiopulmonary abnormality. Electronically Signed   By: Genevie Ann M.D.   On: 01/01/2021 07:26   CT Angio Chest PE W and/or  Wo Contrast  Result Date: 01/01/2021 CLINICAL DATA:  60 year old male with chest pain, shortness of breath, recent COVID-19. History of DVT. EXAM: CT ANGIOGRAPHY CHEST WITH CONTRAST TECHNIQUE: Multidetector CT imaging of the chest was performed using the standard protocol during bolus administration of intravenous contrast. Multiplanar CT  image reconstructions and MIPs were obtained to evaluate the vascular anatomy. CONTRAST:  135m OMNIPAQUE IOHEXOL 350 MG/ML SOLN COMPARISON:  Chest radiographs 0708 hours today. Prior chest CTA 03/23/2013. FINDINGS: Cardiovascular: Initially poor but ultimately excellent contrast bolus timing in the pulmonary arterial tree after repeat bolus injection at 1109 hours. No focal filling defect identified in the pulmonary arteries to suggest acute pulmonary embolism. Calcified coronary artery atherosclerosis and/or stents (series 17, image 60). No cardiomegaly or pericardial effusion. Negative visible aorta on the initial contrast bolus images. Mediastinum/Nodes: Negative.  No mediastinal lymphadenopathy. Lungs/Pleura: Major airways are patent with mild atelectatic changes. No consolidation or pleural effusion. Mild mosaic attenuation in both lungs appears compatible with mild atelectasis and gas trapping. No confluent or convincing inflammatory lung opacity. Upper Abdomen: Chronic hepatic steatosis. Negative visible gallbladder, spleen (chronic splenule), pancreas, adrenal glands, and bowel in the upper abdomen. Nephrolithiasis evident on series 6, image 170. No hydronephrosis is apparent. Musculoskeletal: Osteopenia. Subacute appearing fracture of the left lateral 10th rib on series 7, image 138 with partial healing. No acute osseous abnormality identified. Review of the MIP images confirms the above findings. IMPRESSION: 1. Negative for acute pulmonary embolus. No acute pulmonary finding aside from mild atelectasis and gas trapping. 2. Subacute appearing fracture of the left lateral  10th rib. 3. Calcified coronary artery atherosclerosis. 4. Chronic fatty liver disease. 5. Nephrolithiasis. Electronically Signed   By: HGenevie AnnM.D.   On: 01/01/2021 11:31    Procedures Procedures   Medications Ordered in ED Medications  aspirin chewable tablet 324 mg (has no administration in time range)  iohexol (OMNIPAQUE) 350 MG/ML injection 100 mL (100 mLs Intravenous Contrast Given 01/01/21 0953)  iohexol (OMNIPAQUE) 350 MG/ML injection 100 mL (100 mLs Intravenous Contrast Given 01/01/21 1124)    ED Course  I have reviewed the triage vital signs and the nursing notes.  Pertinent labs & imaging results that were available during my care of the patient were reviewed by me and considered in my medical decision making (see chart for details).    MDM Rules/Calculators/A&P                           60year old male with above past medical history presenting to the emergency department with exertional dyspnea.  Vital signs reviewed on arrival to the emergency department, he has tachycardia at rest.  Physical exam is notable for clear lung sounds, mild venous stasis of the bilateral lower extremities but no asymmetric swelling.  Differential includes ACS, pulmonary embolism, persistent viral infection, post viral syndrome, metabolic acidosis.  EKG obtained shows sinus tachycardia at 121 bpm but no anatomic ST elevations or ST depressions of be concerning for acute ischemia.  Initial troponin obtained in triage is negative.  Although the patient is actively anticoagulated, he reports being suddenly dyspneic this morning which raises concern for pulmonary embolism.  His chest x-ray does not show any pneumonia or pneumothorax and his breath sounds are clear, therefore due to the pretest probability will obtain a CTA PE study to further stratify the patient.  CTA PE study is negative for acute pulmonary embolism.  Delta troponin trended from 17-56.  Given this large change over time, patient warrants  admission to the hospital for further cardiac work-up.  Hospitalist consulted for admission.  Given full dose aspirin.  Patient denies any active chest pain, therefore will defer heparin initiation at this time.  Final Clinical Impression(s) / ED Diagnoses Final diagnoses:  Exertional dyspnea    Rx / DC Orders ED Discharge Orders     None        Claud Kelp, MD 01/01/21 1253    Carmin Muskrat, MD 01/01/21 1544

## 2021-01-01 NOTE — H&P (View-Only) (Signed)
Cardiology Consultation:   Patient ID: Evan Leonard MRN: [id number]; DOB: 1960/07/28  Admit date: 01/01/2021 Date of Consult: 01/01/2021  PCP:  Evan Olp, MD   [facility] Cardiologist:  Evan Leonard   Patient Profile:   Evan Leonard is a 60 y.o. male with a hx of uncontrolled diabetes mellitus, prior hypertension (currently not on antihypertensive), hyperlipidemia, morbid obesity, history of DVT and pulmonary embolism in 2013, on Eliquis for anticoagulation and obstructive sleep apnea who is being seen 01/01/2021 for the evaluation of chest pain at the request of Evan Leonard.  History of Present Illness:   [name] was diagnosed with COVID-19 about 12 days ago.  Recovered without any intervention.  Did not require monoclonal antibody or antiviral treatment.  He has received 1 dose of COVID-vaccine.  He has residual very mild cough without any symptoms.  [name] work-up from sleep around 5 AM to get ready for work.  He noted mild dyspnea but it worsened.  15 minutes later, he started to having substernal chest tightness.  It was associated with diaphoresis and radiating to shoulder and jaw.  Symptoms worsen and came to ER evaluation for further evaluation.  Rated chest tightness 7 out of 10.  Pain resolved without intervention in ER.  Total duration of symptoms 3 to 4 hours.  Chest pain-free during my evaluation. Last dose of Eliquis PM of 12/31/20.  High-sensitivity troponin 17>>56>>361 CRP normal Sed rate 21 Hemoglobin A1c 9.5 Electrolyte and creatinine are normal Chest x-ray without acute cardiopulmonary disease CT angio of chest without pulmonary embolism but noted coronary artery atherosclerosis  Patient denies prior cardiac history.  Denies tobacco, alcohol use or illicit drug use.  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  At the end of the day ,he has noted lower extremity edema but denies orthopnea or PND.  Reports low-sodium diet.  For the past 3 to 4  months patient has noted a progressive worsening dyspnea on exertion without particular chest tightness.  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   Past Medical History:  Diagnosis Date   Diabetes mellitus, type II ([place]) 02/2013   Diabetic ulcer of right foot due to type 2 diabetes mellitus ([place]) 03/21/2018   DVT (deep venous thrombosis) ([place]) 05/04/2012   LLE   Exertional dyspnea 05/04/2012   "isolated episode" (05/05/2012)   Hepatic steatosis    History of chickenpox    Hypertension 03/21/2018   Obesity, Class III, BMI 40-49.9 (morbid obesity) ([place]) 03/21/2018   OSA on CPAP    Peripheral vascular disease ([facility])    varicose veins   Pulmonary embolism ([place]) 05/04/2012   bilaterally   Pyelonephritis 04/2017   Small bowel obstruction ([place]) 09/06/2015   Varicose veins     Past Surgical History:  Procedure Laterality Date   CYSTOSCOPY W/ URETERAL STENT PLACEMENT Right 05/12/2017   Procedure: CYSTOSCOPY WITH RETROGRADE PYELOGRAM/URETERAL STENT PLACEMENT;  Surgeon: [name], MD;  Location: [facility];  Service: Urology;  Laterality: Right;   CYSTOSCOPY/URETEROSCOPY/HOLMIUM LASER/STENT PLACEMENT Right 07/29/2017   Procedure: CYSTOSCOPY RIGHT URETEROSCOPY HOLMIUM LASER STENT EXCHANGE;  Surgeon: [name], MD;  Location: [facility];  Service: Urology;  Laterality: Right;   HERNIA REPAIR     2015   LAPAROTOMY N/A 12/13/2013   Procedure: EXPLORATORY LAPAROTOMY Repair ventral hernia, without mesh, partial omentectomy;  Surgeon: Gwenyth Ober, MD;  Location: [facility];  Service: General;  Laterality: N/A;  VENTRAL HERNIA REPAIR N/A 04/26/2017   Procedure: INCARCERATED HERNIA REPAIR VENTRAL ADULT;  Surgeon: Georganna Skeans, MD;  Location: [facility];  Service: General;  Laterality: N/A;    Inpatient Medications: Scheduled Meds:  insulin aspart  0-15 Units Subcutaneous TID WC   Continuous Infusions:  sodium chloride 100 mL/hr at 01/01/21 1410    PRN Meds: acetaminophen **OR** acetaminophen, guaiFENesin-dextromethorphan, HYDROcodone-acetaminophen  Allergies:   No Known Allergies  Social History:   Social History   Socioeconomic History   Marital status: Single    Spouse name: Not on file   Number of children: Not on file   Years of education: Not on file   Highest education level: Not on file  Occupational History   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 Lives alone. Friends that live close. No pets.       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 day before surgery planned   Other Maternal Grandmother        states natural causes all grandparents     ROS:  Please see the history of present illness.  All other ROS reviewed and negative.     Physical Exam/Data:   Vitals:   01/01/21 0915 01/01/21 1045 01/01/21 1217 01/01/21 1445  BP: 118/78 108/74 118/88 127/73  Pulse: 79 78 80 81  Resp: '16 15 20   '$ Temp:      TempSrc:      SpO2: 94% 96% 97% 95%  Weight:      Height:       No intake or output data  in the 24 hours ending 01/01/21 1623 Last 3 Weights 01/01/2021 07/20/2020 03/06/2020  Weight (lbs) 330 lb 11 oz 348 lb 12.8 oz 322 lb 4 oz  Weight (kg) 150 kg 158.215 kg 146.172 kg     Body mass index is 42.46 kg/m.  General:  Obese male in no acute distress HEENT: normal Lymph: no adenopathy Neck: no JVD Endocrine:  No thryomegaly Vascular: No carotid bruits; FA pulses 2+ bilaterally without bruits  Cardiac:  normal S1, S2; RRR; no murmur  Lungs:  clear to auscultation bilaterally, no wheezing, rhonchi or rales  Abd: soft, nontender, no hepatomegaly  Ext: no edema Musculoskeletal:  No deformities, BUE and BLE strength normal and equal Skin: warm and dry  Neuro:  CNs 2-12 intact, no focal abnormalities noted  Psych:  Normal affect   EKG:  The EKG was personally reviewed and demonstrates:  Sinus tachycardia at rate of 120 bpm Telemetry:  Telemetry was personally reviewed and demonstrates:  NSR  Relevant CV Studies:  Pending echo  Laboratory Data:  High Sensitivity Troponin:   Recent Labs  Lab 01/01/21 0645 01/01/21 1056 01/01/21 1311  TROPONINIHS 17 56* 361*     Chemistry Recent Labs  Lab 01/01/21 0645  NA 136  K 3.9  CL 105  CO2 19*  GLUCOSE 245*  BUN 18  CREATININE 1.19  CALCIUM 9.5  GFRNONAA >60  ANIONGAP 12     Hematology Recent Labs  Lab 01/01/21 0645  WBC 8.5  RBC 4.97  HGB 14.7  HCT 46.4  MCV 93.4  MCH 29.6  MCHC 31.7  RDW 13.9  PLT 218   BNP Recent Labs  Lab 01/01/21 0654  BNP 634.1*   Radiology/Studies:  DG Chest 2 View  Result Date: 01/01/2021 CLINICAL DATA:  60 year old male with shortness of breath and chest tightness this morning. Status post COVID-19. 2 weeks ago. EXAM: CHEST - 2 VIEW COMPARISON:  Chest radiograph 12/20/2020 and earlier. FINDINGS: Continued stable lung volumes and mediastinal contours. Visualized tracheal air column is within normal limits. No pneumothorax, pulmonary edema, pleural effusion or consolidation.  Chronic mild left lung base atelectasis or scarring is stable since 2018. no acute pulmonary opacity. No acute osseous abnormality identified. Negative visible bowel gas pattern. IMPRESSION: No acute cardiopulmonary abnormality. Electronically Signed   By: Genevie Ann M.D.   On: 01/01/2021 07:26   CT Angio Chest PE W and/or Wo Contrast  Result Date: 01/01/2021 CLINICAL DATA:  60 year old male with chest pain, shortness of breath, recent COVID-19. History of DVT. EXAM: CT ANGIOGRAPHY CHEST WITH CONTRAST TECHNIQUE: Multidetector CT imaging of the chest was performed using the standard protocol during bolus administration of intravenous contrast. Multiplanar CT image reconstructions and MIPs were obtained to evaluate the vascular anatomy. CONTRAST:  135m OMNIPAQUE IOHEXOL 350 MG/ML SOLN COMPARISON:  Chest radiographs 0708 hours today. Prior chest CTA 03/23/2013. FINDINGS: Cardiovascular: Initially poor but ultimately excellent contrast bolus timing in the pulmonary arterial tree after repeat bolus injection at 1109 hours. No focal filling defect identified in the pulmonary arteries to suggest acute pulmonary embolism. Calcified coronary artery atherosclerosis and/or stents (series 17, image 60). No cardiomegaly or pericardial effusion. Negative visible aorta on the initial contrast bolus images. Mediastinum/Nodes: Negative.  No mediastinal lymphadenopathy. Lungs/Pleura: Major airways are patent with mild atelectatic changes. No consolidation or pleural effusion. Mild mosaic attenuation in both lungs appears compatible with mild atelectasis and gas trapping. No confluent or convincing inflammatory lung opacity. Upper Abdomen: Chronic hepatic steatosis. Negative visible gallbladder, spleen (chronic splenule), pancreas, adrenal glands, and bowel in the upper abdomen. Nephrolithiasis evident on series 6, image 170. No hydronephrosis is apparent. Musculoskeletal: Osteopenia. Subacute appearing fracture of the left lateral  10th rib on series 7, image 138 with partial healing. No acute osseous abnormality identified. Review of the MIP images confirms the above findings. IMPRESSION: 1. Negative for acute pulmonary embolus. No acute pulmonary finding aside from mild atelectasis and gas trapping. 2. Subacute appearing fracture of the left lateral 10th rib. 3. Calcified coronary artery atherosclerosis. 4. Chronic fatty liver disease. 5. Nephrolithiasis. Electronically Signed   By: HGenevie AnnM.D.   On: 01/01/2021 11:31     Assessment and Plan:   Non-STEMI Patient symptoms concerning for unstable angina. High-sensitivity troponin 17>>56>>361.  EKG without acute changes.  CT angio of chest without pulmonary embolism but noted coronary artery atherosclerosis.  CRP normal.  Sed rate 21. Cardiac risk factor includes uncontrolled diabetes, prior hypertension (currently not on any antihypertensive) and obesity.  His last dose of Eliquis was yesterday p.m. -Differential includes ACS (highly suspected) versus pericarditis (less suspicious, no pleuritic symptoms) -Pending echocardiogram -Cycle troponin -Hold Eliquis and start heparin for ACS -Likely cardiac catheterization in morning.   2.  Uncontrolled diabetes mellitus -Hemoglobin A1c 9.5 -Tx per primary team - Hold metformin   3. HLD - No results found for requested labs within last 8760 hours.  - Get Lipid panel in AM - start Lipitor '80mg'$  qd   Risk Assessment/Risk Scores:   TIMI Risk Score for Unstable Angina or Non-ST Elevation MI:   The patient's TIMI risk score is 2, which indicates a 8% risk of all cause mortality, Evan or recurrent myocardial infarction or need for urgent revascularization in the next 14 days.{  For questions or updates, please contact [facility] Please consult [email or web address] for contact info under    Jarrett Soho, PA  01/01/2021 4:23 PM   I have personally seen and examined this patient. I agree with the assessment and plan  as outlined above.  60 yo male with history of obesity, DM, HTN, HLD and prior DVT on Eliquis presenting to the ED with chest pain c/w unstable angina. Troponin is elevated and rising. EKG is personally reviewed by me and shows sinus tachycardia with no ischemia changes.  Labs reviewed by me.  My exam:  General: Obese male in NAD  HEENT: OP clear, mucus membranes moist  SKIN: warm, dry. No rashes. Neuro: No focal deficits  Musculoskeletal: Muscle strength 5/5 all ext  Psychiatric: Mood and affect normal  Neck: No JVD, no carotid bruits, no thyromegaly, no lymphadenopathy.  Lungs:Clear bilaterally, no wheezes, rhonci, crackles Cardiovascular: Regular rate and rhythm. No murmurs, gallops or rubs. Abdomen:Soft. Bowel sounds present. Non-tender.  Extremities: No lower extremity edema. Pulses are 2 + in the bilateral DP/PT. Plan: NSTEMI: Troponin elevation in obese male with uncontrolled DM with chest pain c/w unstable angina. Cardiac cath is indicated. Will make NPO at midnight. Eliquis held. Last dose last night. Start IV heparin.   Lauree Chandler, MD, [facility] 01/01/2021 4:44 PM

## 2021-01-01 NOTE — Consult Note (Addendum)
Cardiology Consultation:   Patient ID: Evan Leonard MRN: [id number]; DOB: Dec 07, 1960  Admit date: 01/01/2021 Date of Consult: 01/01/2021  PCP:  Evan Olp, MD   [place] [facility] Cardiologist:  New to [facility]   Patient Profile:   Evan Leonard is a 60 y.o. male with a hx of uncontrolled diabetes mellitus, prior hypertension (currently not on antihypertensive), hyperlipidemia, morbid obesity, history of DVT and pulmonary embolism in 2013, on Eliquis for anticoagulation and obstructive sleep apnea who is being seen 01/01/2021 for the evaluation of chest pain at the request of Dr. Rogers Leonard.  History of Present Illness:   Mr. Evan Leonard was diagnosed with COVID-19 about 12 days ago.  Recovered without any intervention.  Did not require monoclonal antibody or antiviral treatment.  He has received 1 dose of COVID-vaccine.  He has residual very mild cough without any symptoms.  Mr. Evan Leonard work-up from sleep around 5 AM to get ready for work.  He noted mild dyspnea but it worsened.  15 minutes later, he started to having substernal chest tightness.  It was associated with diaphoresis and radiating to shoulder and jaw.  Symptoms worsen and came to ER evaluation for further evaluation.  Rated chest tightness 7 out of 10.  Pain resolved without intervention in ER.  Total duration of symptoms 3 to 4 hours.  Chest pain-free during my evaluation. Last dose of Eliquis PM of 12/31/20.  High-sensitivity troponin 17>>56>>361 CRP normal Sed rate 21 Hemoglobin A1c 9.5 Electrolyte and creatinine are normal Chest x-ray without acute cardiopulmonary disease CT angio of chest without pulmonary embolism but noted coronary artery atherosclerosis  Patient denies prior cardiac history.  Denies tobacco, alcohol use or illicit drug use.  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  At the end of the day ,he has noted lower extremity edema but denies orthopnea or PND.  Reports low-sodium diet.  For the past 3 to 4  months patient has noted a progressive worsening dyspnea on exertion without particular chest tightness.  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   Past Medical History:  Diagnosis Date   Diabetes mellitus, type II ([place]) 02/2013   Diabetic ulcer of right foot due to type 2 diabetes mellitus ([place]) 03/21/2018   DVT (deep venous thrombosis) ([place]) 05/04/2012   LLE   Exertional dyspnea 05/04/2012   "isolated episode" (05/05/2012)   Hepatic steatosis    History of chickenpox    Hypertension 03/21/2018   Obesity, Class III, BMI 40-49.9 (morbid obesity) ([place]) 03/21/2018   OSA on CPAP    Peripheral vascular disease ([facility])    varicose veins   Pulmonary embolism ([place]) 05/04/2012   bilaterally   Pyelonephritis 04/2017   Small bowel obstruction ([place]) 09/06/2015   Varicose veins     Past Surgical History:  Procedure Laterality Date   CYSTOSCOPY W/ URETERAL STENT PLACEMENT Right 05/12/2017   Procedure: CYSTOSCOPY WITH RETROGRADE PYELOGRAM/URETERAL STENT PLACEMENT;  Surgeon: [name], MD;  Location: [facility];  Service: Urology;  Laterality: Right;   CYSTOSCOPY/URETEROSCOPY/HOLMIUM LASER/STENT PLACEMENT Right 07/29/2017   Procedure: CYSTOSCOPY RIGHT URETEROSCOPY HOLMIUM LASER STENT EXCHANGE;  Surgeon: [name], MD;  Location: [facility];  Service: Urology;  Laterality: Right;   HERNIA REPAIR     2015   LAPAROTOMY N/A 12/13/2013   Procedure: EXPLORATORY LAPAROTOMY Repair ventral hernia, without mesh, partial omentectomy;  Surgeon: Gwenyth Ober, MD;  Location: [place];  Service: General;  Laterality: N/A;  VENTRAL HERNIA REPAIR N/A 04/26/2017   Procedure: INCARCERATED HERNIA REPAIR VENTRAL ADULT;  Surgeon: Georganna Skeans, MD;  Location: [place];  Service: General;  Laterality: N/A;    Inpatient Medications: Scheduled Meds:  insulin aspart  0-15 Units Subcutaneous TID WC   Continuous Infusions:  sodium chloride 100 mL/hr at 01/01/21 1410    PRN Meds: acetaminophen **OR** acetaminophen, guaiFENesin-dextromethorphan, HYDROcodone-acetaminophen  Allergies:   No Known Allergies  Social History:   Social History   Socioeconomic History   Marital status: Single    Spouse name: Not on file   Number of children: Not on file   Years of education: Not on file   Highest education level: Not on file  Occupational History   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 Lives alone. Friends that live close. No pets.       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 day before surgery planned   Other Maternal Grandmother        states natural causes all grandparents     ROS:  Please see the history of present illness.  All other ROS reviewed and negative.     Physical Exam/Data:   Vitals:   01/01/21 0915 01/01/21 1045 01/01/21 1217 01/01/21 1445  BP: 118/78 108/74 118/88 127/73  Pulse: 79 78 80 81  Resp: '16 15 20   '$ Temp:      TempSrc:      SpO2: 94% 96% 97% 95%  Weight:      Height:       No intake or output data  in the 24 hours ending 01/01/21 1623 Last 3 Weights 01/01/2021 07/20/2020 03/06/2020  Weight (lbs) 330 lb 11 oz 348 lb 12.8 oz 322 lb 4 oz  Weight (kg) 150 kg 158.215 kg 146.172 kg     Body mass index is 42.46 kg/m.  General:  Obese male in no acute distress HEENT: normal Lymph: no adenopathy Neck: no JVD Endocrine:  No thryomegaly Vascular: No carotid bruits; FA pulses 2+ bilaterally without bruits  Cardiac:  normal S1, S2; RRR; no murmur  Lungs:  clear to auscultation bilaterally, no wheezing, rhonchi or rales  Abd: soft, nontender, no hepatomegaly  Ext: no edema Musculoskeletal:  No deformities, BUE and BLE strength normal and equal Skin: warm and dry  Neuro:  CNs 2-12 intact, no focal abnormalities noted  Psych:  Normal affect   EKG:  The EKG was personally reviewed and demonstrates:  Sinus tachycardia at rate of 120 bpm Telemetry:  Telemetry was personally reviewed and demonstrates:  NSR  Relevant CV Studies:  Pending echo  Laboratory Data:  High Sensitivity Troponin:   Recent Labs  Lab 01/01/21 0645 01/01/21 1056 01/01/21 1311  TROPONINIHS 17 56* 361*     Chemistry Recent Labs  Lab 01/01/21 0645  NA 136  K 3.9  CL 105  CO2 19*  GLUCOSE 245*  BUN 18  CREATININE 1.19  CALCIUM 9.5  GFRNONAA >60  ANIONGAP 12     Hematology Recent Labs  Lab 01/01/21 0645  WBC 8.5  RBC 4.97  HGB 14.7  HCT 46.4  MCV 93.4  MCH 29.6  MCHC 31.7  RDW 13.9  PLT 218   BNP Recent Labs  Lab 01/01/21 0654  BNP 634.1*   Radiology/Studies:  DG Chest 2 View  Result Date: 01/01/2021 CLINICAL DATA:  60 year old male with shortness of breath and chest tightness this morning. Status post COVID-19. 2 weeks ago. EXAM: CHEST - 2 VIEW COMPARISON:  Chest radiograph 12/20/2020 and earlier. FINDINGS: Continued stable lung volumes and mediastinal contours. Visualized tracheal air column is within normal limits. No pneumothorax, pulmonary edema, pleural effusion or consolidation.  Chronic mild left lung base atelectasis or scarring is stable since 2018. no acute pulmonary opacity. No acute osseous abnormality identified. Negative visible bowel gas pattern. IMPRESSION: No acute cardiopulmonary abnormality. Electronically Signed   By: Genevie Ann M.D.   On: 01/01/2021 07:26   CT Angio Chest PE W and/or Wo Contrast  Result Date: 01/01/2021 CLINICAL DATA:  60 year old male with chest pain, shortness of breath, recent COVID-19. History of DVT. EXAM: CT ANGIOGRAPHY CHEST WITH CONTRAST TECHNIQUE: Multidetector CT imaging of the chest was performed using the standard protocol during bolus administration of intravenous contrast. Multiplanar CT image reconstructions and MIPs were obtained to evaluate the vascular anatomy. CONTRAST:  136m OMNIPAQUE IOHEXOL 350 MG/ML SOLN COMPARISON:  Chest radiographs 0708 hours today. Prior chest CTA 03/23/2013. FINDINGS: Cardiovascular: Initially poor but ultimately excellent contrast bolus timing in the pulmonary arterial tree after repeat bolus injection at 1109 hours. No focal filling defect identified in the pulmonary arteries to suggest acute pulmonary embolism. Calcified coronary artery atherosclerosis and/or stents (series 17, image 60). No cardiomegaly or pericardial effusion. Negative visible aorta on the initial contrast bolus images. Mediastinum/Nodes: Negative.  No mediastinal lymphadenopathy. Lungs/Pleura: Major airways are patent with mild atelectatic changes. No consolidation or pleural effusion. Mild mosaic attenuation in both lungs appears compatible with mild atelectasis and gas trapping. No confluent or convincing inflammatory lung opacity. Upper Abdomen: Chronic hepatic steatosis. Negative visible gallbladder, spleen (chronic splenule), pancreas, adrenal glands, and bowel in the upper abdomen. Nephrolithiasis evident on series 6, image 170. No hydronephrosis is apparent. Musculoskeletal: Osteopenia. Subacute appearing fracture of the left lateral  10th rib on series 7, image 138 with partial healing. No acute osseous abnormality identified. Review of the MIP images confirms the above findings. IMPRESSION: 1. Negative for acute pulmonary embolus. No acute pulmonary finding aside from mild atelectasis and gas trapping. 2. Subacute appearing fracture of the left lateral 10th rib. 3. Calcified coronary artery atherosclerosis. 4. Chronic fatty liver disease. 5. Nephrolithiasis. Electronically Signed   By: HGenevie AnnM.D.   On: 01/01/2021 11:31     Assessment and Plan:   Non-STEMI Patient symptoms concerning for unstable angina. High-sensitivity troponin 17>>56>>361.  EKG without acute changes.  CT angio of chest without pulmonary embolism but noted coronary artery atherosclerosis.  CRP normal.  Sed rate 21. Cardiac risk factor includes uncontrolled diabetes, prior hypertension (currently not on any antihypertensive) and obesity.  His last dose of Eliquis was yesterday p.m. -Differential includes ACS (highly suspected) versus pericarditis (less suspicious, no pleuritic symptoms) -Pending echocardiogram -Cycle troponin -Hold Eliquis and start heparin for ACS -Likely cardiac catheterization in morning.   2.  Uncontrolled diabetes mellitus -Hemoglobin A1c 9.5 -Tx per primary team - Hold metformin   3. HLD - No results found for requested labs within last 8760 hours.  - Get Lipid panel in AM - start Lipitor '80mg'$  qd   Risk Assessment/Risk Scores:   TIMI Risk Score for Unstable Angina or Non-ST Elevation MI:   The patient's TIMI risk score is 2, which indicates a 8% risk of all cause mortality, new or recurrent myocardial infarction or need for urgent revascularization in the next 14 days.{  For questions or updates, please contact [place] Please consult [email or web address] for contact info under    Jarrett Soho, PA  01/01/2021 4:23 PM   I have personally seen and examined this patient. I agree with the assessment and plan  as outlined above.  60 yo male with history of obesity, DM, HTN, HLD and prior DVT on Eliquis presenting to the ED with chest pain c/w unstable angina. Troponin is elevated and rising. EKG is personally reviewed by me and shows sinus tachycardia with no ischemia changes.  Labs reviewed by me.  My exam:  General: Obese male in NAD  HEENT: OP clear, mucus membranes moist  SKIN: warm, dry. No rashes. Neuro: No focal deficits  Musculoskeletal: Muscle strength 5/5 all ext  Psychiatric: Mood and affect normal  Neck: No JVD, no carotid bruits, no thyromegaly, no lymphadenopathy.  Lungs:Clear bilaterally, no wheezes, rhonci, crackles Cardiovascular: Regular rate and rhythm. No murmurs, gallops or rubs. Abdomen:Soft. Bowel sounds present. Non-tender.  Extremities: No lower extremity edema. Pulses are 2 + in the bilateral DP/PT. Plan: NSTEMI: Troponin elevation in obese male with uncontrolled DM with chest pain c/w unstable angina. Cardiac cath is indicated. Will make NPO at midnight. Eliquis held. Last dose last night. Start IV heparin.   Lauree Chandler, MD, [facility] 01/01/2021 4:44 PM

## 2021-01-02 ENCOUNTER — Encounter (HOSPITAL_COMMUNITY): Payer: Self-pay | Admitting: Family Medicine

## 2021-01-02 ENCOUNTER — Encounter (HOSPITAL_COMMUNITY)
Admission: EM | Disposition: A | Discharge status: Home / Self Care | Payer: Self-pay | Source: Home / Self Care | Attending: Internal Medicine

## 2021-01-02 DIAGNOSIS — Z7984 Long term (current) use of oral hypoglycemic drugs: Secondary | ICD-10-CM | POA: Diagnosis not present

## 2021-01-02 DIAGNOSIS — E785 Hyperlipidemia, unspecified: Secondary | ICD-10-CM | POA: Diagnosis present

## 2021-01-02 DIAGNOSIS — Z8616 Personal history of COVID-19: Secondary | ICD-10-CM | POA: Diagnosis not present

## 2021-01-02 DIAGNOSIS — I25110 Atherosclerotic heart disease of native coronary artery with unstable angina pectoris: Secondary | ICD-10-CM | POA: Diagnosis present

## 2021-01-02 DIAGNOSIS — I2510 Atherosclerotic heart disease of native coronary artery without angina pectoris: Secondary | ICD-10-CM | POA: Diagnosis not present

## 2021-01-02 DIAGNOSIS — Z86718 Personal history of other venous thrombosis and embolism: Secondary | ICD-10-CM | POA: Diagnosis not present

## 2021-01-02 DIAGNOSIS — G4733 Obstructive sleep apnea (adult) (pediatric): Secondary | ICD-10-CM | POA: Diagnosis present

## 2021-01-02 DIAGNOSIS — I10 Essential (primary) hypertension: Secondary | ICD-10-CM | POA: Diagnosis present

## 2021-01-02 DIAGNOSIS — Z79899 Other long term (current) drug therapy: Secondary | ICD-10-CM | POA: Diagnosis not present

## 2021-01-02 DIAGNOSIS — R0600 Dyspnea, unspecified: Secondary | ICD-10-CM | POA: Diagnosis present

## 2021-01-02 DIAGNOSIS — E1151 Type 2 diabetes mellitus with diabetic peripheral angiopathy without gangrene: Secondary | ICD-10-CM | POA: Diagnosis present

## 2021-01-02 DIAGNOSIS — Z6841 Body Mass Index (BMI) 40.0 and over, adult: Secondary | ICD-10-CM | POA: Diagnosis not present

## 2021-01-02 DIAGNOSIS — E1165 Type 2 diabetes mellitus with hyperglycemia: Secondary | ICD-10-CM | POA: Diagnosis present

## 2021-01-02 DIAGNOSIS — Z86711 Personal history of pulmonary embolism: Secondary | ICD-10-CM | POA: Diagnosis not present

## 2021-01-02 DIAGNOSIS — K760 Fatty (change of) liver, not elsewhere classified: Secondary | ICD-10-CM | POA: Diagnosis present

## 2021-01-02 DIAGNOSIS — I82432 Acute embolism and thrombosis of left popliteal vein: Secondary | ICD-10-CM | POA: Diagnosis not present

## 2021-01-02 DIAGNOSIS — Z8249 Family history of ischemic heart disease and other diseases of the circulatory system: Secondary | ICD-10-CM | POA: Diagnosis not present

## 2021-01-02 DIAGNOSIS — Z833 Family history of diabetes mellitus: Secondary | ICD-10-CM | POA: Diagnosis not present

## 2021-01-02 DIAGNOSIS — Z7901 Long term (current) use of anticoagulants: Secondary | ICD-10-CM | POA: Diagnosis not present

## 2021-01-02 DIAGNOSIS — I214 Non-ST elevation (NSTEMI) myocardial infarction: Secondary | ICD-10-CM | POA: Diagnosis present

## 2021-01-02 DIAGNOSIS — S2232XA Fracture of one rib, left side, initial encounter for closed fracture: Secondary | ICD-10-CM | POA: Diagnosis present

## 2021-01-02 DIAGNOSIS — W19XXXA Unspecified fall, initial encounter: Secondary | ICD-10-CM | POA: Diagnosis present

## 2021-01-02 DIAGNOSIS — Z28311 Partially vaccinated for covid-19: Secondary | ICD-10-CM | POA: Diagnosis not present

## 2021-01-02 HISTORY — PX: LEFT HEART CATH AND CORONARY ANGIOGRAPHY: CATH118249

## 2021-01-02 HISTORY — PX: CORONARY STENT INTERVENTION: CATH118234

## 2021-01-02 LAB — CBC WITH DIFFERENTIAL/PLATELET
Abs Immature Granulocytes: 0.04 10*3/uL (ref 0.00–0.07)
Basophils Absolute: 0.1 10*3/uL (ref 0.0–0.1)
Basophils Relative: 1 %
Eosinophils Absolute: 0.4 10*3/uL (ref 0.0–0.5)
Eosinophils Relative: 6 %
HCT: 40.3 % (ref 39.0–52.0)
Hemoglobin: 13.3 g/dL (ref 13.0–17.0)
Immature Granulocytes: 1 %
Lymphocytes Relative: 33 %
Lymphs Abs: 2.4 10*3/uL (ref 0.7–4.0)
MCH: 30.2 pg (ref 26.0–34.0)
MCHC: 33 g/dL (ref 30.0–36.0)
MCV: 91.4 fL (ref 80.0–100.0)
Monocytes Absolute: 0.6 10*3/uL (ref 0.1–1.0)
Monocytes Relative: 8 %
Neutro Abs: 3.7 10*3/uL (ref 1.7–7.7)
Neutrophils Relative %: 51 %
Platelets: 192 10*3/uL (ref 150–400)
RBC: 4.41 MIL/uL (ref 4.22–5.81)
RDW: 14 % (ref 11.5–15.5)
WBC: 7.2 10*3/uL (ref 4.0–10.5)
nRBC: 0 % (ref 0.0–0.2)

## 2021-01-02 LAB — GLUCOSE, CAPILLARY
Glucose-Capillary: 178 mg/dL — ABNORMAL HIGH (ref 70–99)
Glucose-Capillary: 159 mg/dL — ABNORMAL HIGH (ref 70–99)
Glucose-Capillary: 119 mg/dL — ABNORMAL HIGH (ref 70–99)
Glucose-Capillary: 184 mg/dL — ABNORMAL HIGH (ref 70–99)

## 2021-01-02 LAB — LIPID PANEL
Cholesterol: 151 mg/dL (ref 0–200)
HDL: 22 mg/dL — ABNORMAL LOW (ref >40)
LDL Cholesterol: 90 mg/dL (ref 0–99)
Total CHOL/HDL Ratio: 6.9 RATIO
Triglycerides: 194 mg/dL — ABNORMAL HIGH (ref <150)
VLDL: 39 mg/dL (ref 0–40)

## 2021-01-02 LAB — BASIC METABOLIC PANEL
Anion gap: 5 (ref 5–15)
BUN: 18 mg/dL (ref 8–23)
CO2: 23 mmol/L (ref 22–32)
Calcium: 8.8 mg/dL — ABNORMAL LOW (ref 8.9–10.3)
Chloride: 106 mmol/L (ref 98–111)
Creatinine, Ser: 0.88 mg/dL (ref 0.61–1.24)
GFR, Estimated: >60 mL/min (ref >60)
Glucose, Bld: 160 mg/dL — ABNORMAL HIGH (ref 70–99)
Potassium: 4.1 mmol/L (ref 3.5–5.1)
Sodium: 134 mmol/L — ABNORMAL LOW (ref 135–145)

## 2021-01-02 LAB — POCT ACTIVATED CLOTTING TIME
Activated Clotting Time: 237 seconds
Activated Clotting Time: 341 seconds

## 2021-01-02 LAB — APTT: aPTT: 36 seconds (ref 24–36)

## 2021-01-02 LAB — HEPARIN LEVEL (UNFRACTIONATED): Heparin Unfractionated: 0.66 IU/mL (ref 0.30–0.70)

## 2021-01-02 SURGERY — LEFT HEART CATH AND CORONARY ANGIOGRAPHY
Anesthesia: LOCAL

## 2021-01-02 MED ORDER — CLOPIDOGREL BISULFATE 300 MG PO TABS
ORAL_TABLET | ORAL | Status: AC
  Filled 2021-01-02: qty 2

## 2021-01-02 MED ORDER — LABETALOL HCL 5 MG/ML IV SOLN: 10.0000 mg | INTRAVENOUS | Status: AC | PRN

## 2021-01-02 MED ORDER — ASPIRIN 81 MG PO CHEW: 81.0000 mg | CHEWABLE_TABLET | Freq: Every day | ORAL | Status: DC

## 2021-01-02 MED ORDER — VERAPAMIL HCL 2.5 MG/ML IV SOLN
INTRAVENOUS | Status: AC
  Filled 2021-01-02: qty 2

## 2021-01-02 MED ORDER — CLOPIDOGREL BISULFATE 300 MG PO TABS
ORAL_TABLET | ORAL | Status: DC | PRN
  Administered 2021-01-02: 600 mg via ORAL

## 2021-01-02 MED ORDER — LIDOCAINE HCL (PF) 1 % IJ SOLN
INTRAMUSCULAR | Status: DC | PRN
  Administered 2021-01-02: 2 mL

## 2021-01-02 MED ORDER — HEPARIN (PORCINE) IN NACL 1000-0.9 UT/500ML-% IV SOLN
INTRAVENOUS | Status: DC | PRN
  Administered 2021-01-02 (×2): 500 mL

## 2021-01-02 MED ORDER — HEPARIN SODIUM (PORCINE) 1000 UNIT/ML IJ SOLN
INTRAMUSCULAR | Status: AC
  Filled 2021-01-02: qty 1

## 2021-01-02 MED ORDER — FENTANYL CITRATE (PF) 100 MCG/2ML IJ SOLN
INTRAMUSCULAR | Status: AC
  Filled 2021-01-02: qty 2

## 2021-01-02 MED ORDER — FENTANYL CITRATE (PF) 100 MCG/2ML IJ SOLN
INTRAMUSCULAR | Status: DC | PRN
  Administered 2021-01-02 (×3): 25 ug via INTRAVENOUS

## 2021-01-02 MED ORDER — HEPARIN SODIUM (PORCINE) 1000 UNIT/ML IJ SOLN
INTRAMUSCULAR | Status: DC | PRN
  Administered 2021-01-02: 3000 [IU] via INTRAVENOUS
  Administered 2021-01-02 (×2): 6000 [IU] via INTRAVENOUS

## 2021-01-02 MED ORDER — ONDANSETRON HCL 4 MG/2ML IJ SOLN: 4.0000 mg | Freq: Four times a day (QID) | INTRAMUSCULAR | Status: DC | PRN

## 2021-01-02 MED ORDER — MIDAZOLAM HCL 2 MG/2ML IJ SOLN
INTRAMUSCULAR | Status: DC | PRN
  Administered 2021-01-02 (×2): 1 mg via INTRAVENOUS
  Administered 2021-01-02: 2 mg via INTRAVENOUS

## 2021-01-02 MED ORDER — SODIUM CHLORIDE 0.9% FLUSH: 3.0000 mL | INTRAVENOUS | Status: DC | PRN

## 2021-01-02 MED ORDER — TIROFIBAN (AGGRASTAT) BOLUS VIA INFUSION
INTRAVENOUS | Status: DC | PRN
Start: 2021-01-02 — End: 2021-01-02
  Administered 2021-01-02: 3895 ug via INTRAVENOUS

## 2021-01-02 MED ORDER — VERAPAMIL HCL 2.5 MG/ML IV SOLN
INTRAVENOUS | Status: DC | PRN
  Administered 2021-01-02: 10 mL via INTRA_ARTERIAL

## 2021-01-02 MED ORDER — CLOPIDOGREL BISULFATE 75 MG PO TABS
75.0000 mg | ORAL_TABLET | Freq: Every day | ORAL | Status: DC
  Administered 2021-01-03: 75 mg via ORAL
  Filled 2021-01-02: qty 1

## 2021-01-02 MED ORDER — TIROFIBAN HCL IN NACL 5-0.9 MG/100ML-% IV SOLN
INTRAVENOUS | Status: AC
  Filled 2021-01-02: qty 100

## 2021-01-02 MED ORDER — IOHEXOL 350 MG/ML SOLN
INTRAVENOUS | Status: DC | PRN
  Administered 2021-01-02: 140 mL

## 2021-01-02 MED ORDER — TIROFIBAN HCL IN NACL 5-0.9 MG/100ML-% IV SOLN
INTRAVENOUS | Status: DC | PRN
  Administered 2021-01-02: 0.15 ug/kg/min via INTRAVENOUS

## 2021-01-02 MED ORDER — ACETAMINOPHEN 325 MG PO TABS: 650.0000 mg | ORAL_TABLET | ORAL | Status: DC | PRN

## 2021-01-02 MED ORDER — SODIUM CHLORIDE 0.9% FLUSH
3.0000 mL | Freq: Two times a day (BID) | INTRAVENOUS | Status: DC
  Administered 2021-01-02 – 2021-01-03 (×2): 3 mL via INTRAVENOUS

## 2021-01-02 MED ORDER — SODIUM CHLORIDE 0.9 % IV SOLN: INTRAVENOUS | Status: AC

## 2021-01-02 MED ORDER — HEPARIN (PORCINE) 25000 UT/250ML-% IV SOLN: 1800.0000 [IU]/h | INTRAVENOUS | Status: DC

## 2021-01-02 MED ORDER — SODIUM CHLORIDE 0.9 % IV SOLN: 250.0000 mL | INTRAVENOUS | Status: DC | PRN

## 2021-01-02 MED ORDER — HYDRALAZINE HCL 20 MG/ML IJ SOLN: 10.0000 mg | INTRAMUSCULAR | Status: AC | PRN

## 2021-01-02 MED ORDER — HEPARIN (PORCINE) IN NACL 1000-0.9 UT/500ML-% IV SOLN
INTRAVENOUS | Status: AC
  Filled 2021-01-02: qty 1000

## 2021-01-02 MED ORDER — MIDAZOLAM HCL 2 MG/2ML IJ SOLN
INTRAMUSCULAR | Status: AC
  Filled 2021-01-02: qty 2

## 2021-01-02 MED ORDER — LIDOCAINE HCL (PF) 1 % IJ SOLN
INTRAMUSCULAR | Status: AC
  Filled 2021-01-02: qty 30

## 2021-01-02 SURGICAL SUPPLY — 20 items
BALLN SAPPHIRE 3.0X15 (BALLOONS) ×2
BALLN ~~LOC~~ SAPPHIRE 4.5X8 (BALLOONS) ×2
BALLOON SAPPHIRE 3.0X15 (BALLOONS) ×1 IMPLANT
BALLOON ~~LOC~~ SAPPHIRE 4.5X8 (BALLOONS) ×1 IMPLANT
CATH 5FR JL3.5 JR4 ANG PIG MP (CATHETERS) ×2 IMPLANT
CATH OPTICROSS HD (CATHETERS) ×2 IMPLANT
CATH VISTA GUIDE 6FR XBRCA (CATHETERS) ×2 IMPLANT
GLIDESHEATH SLEND SS 6F .021 (SHEATH) ×2 IMPLANT
GUIDEWIRE INQWIRE 1.5J.035X260 (WIRE) ×1 IMPLANT
INQWIRE 1.5J .035X260CM (WIRE) ×2
KIT ENCORE 26 ADVANTAGE (KITS) ×2 IMPLANT
KIT HEART LEFT (KITS) ×2 IMPLANT
PACK CARDIAC CATHETERIZATION (CUSTOM PROCEDURE TRAY) ×2 IMPLANT
SHEATH PROBE COVER 6X72 (BAG) ×2 IMPLANT
SLED PULL BACK IVUS (MISCELLANEOUS) ×2 IMPLANT
STENT ONYX FRONTIER 4.0X26 (Permanent Stent) ×2 IMPLANT
TRANSDUCER W/STOPCOCK (MISCELLANEOUS) ×2 IMPLANT
TUBING CIL FLEX 10 FLL-RA (TUBING) ×2 IMPLANT
VALVE COPILOT STAT (MISCELLANEOUS) ×2 IMPLANT
WIRE ASAHI PROWATER 180CM (WIRE) ×2 IMPLANT

## 2021-01-02 NOTE — Interval H&P Note (Signed)
Cath Lab Visit (complete for each Cath Lab visit)  Clinical Evaluation Leading to the Procedure:   ACS: Yes.    Non-ACS:    Anginal Classification: CCS IV  Anti-ischemic medical therapy: Minimal Therapy (1 class of medications)  Non-Invasive Test Results: No non-invasive testing performed  Prior CABG: No previous CABG      History and Physical Interval Note:  01/02/2021 7:39 AM  Evan Leonard  has presented today for surgery, with the diagnosis of nstemi.  The various methods of treatment have been discussed with the patient and family. After consideration of risks, benefits and other options for treatment, the patient has consented to  Procedure(s): LEFT HEART CATH AND CORONARY ANGIOGRAPHY (N/A) as a surgical intervention.  The patient's history has been reviewed, patient examined, no change in status, stable for surgery.  I have reviewed the patient's chart and labs.  Questions were answered to the patient's satisfaction.     Larae Grooms

## 2021-01-02 NOTE — Progress Notes (Signed)
Progress Note  Patient Name: Evan Leonard Date of Encounter: 01/02/2021  [facility] Cardiologist: None   Subjective   No chest pain post cath/PCI  Inpatient Medications    Scheduled Meds:  [MAR Hold] aspirin EC  81 mg Oral Daily   [MAR Hold] atorvastatin  80 mg Oral Daily   [MAR Hold] insulin aspart  0-15 Units Subcutaneous TID WC   [MAR Hold] multivitamin with minerals  1 tablet Oral Daily   [MAR Hold] rosuvastatin  40 mg Oral Once per day on Mon Wed Fri   [facility] Hold] sodium chloride flush  3 mL Intravenous Q12H   Continuous Infusions:  sodium chloride     sodium chloride 125 mL/hr at 01/02/21 0941   sodium chloride 1 mL/kg/hr (01/02/21 0758)   PRN Meds: sodium chloride, [MAR Hold] acetaminophen **OR** [MAR Hold] acetaminophen, acetaminophen, [MAR Hold] benzonatate, [MAR Hold] guaiFENesin-dextromethorphan, [MAR Hold] HYDROcodone-acetaminophen, ondansetron (ZOFRAN) IV, sodium chloride flush   Vital Signs    Vitals:   01/02/21 0930 01/02/21 0935 01/02/21 0945 01/02/21 0950  BP: (!) 127/91 116/66 126/67 132/80  Pulse: 81 74 76 95  Resp: '19 20 19 20  '$ Temp:      TempSrc:      SpO2: 98% 95% 95% 97%  Weight:      Height:        Intake/Output Summary (Last 24 hours) at 01/02/2021 1042 Last data filed at 01/02/2021 0700 Gross per 24 hour  Intake 115.38 ml  Output 2 ml  Net 113.38 ml   Last 3 Weights 01/01/2021 01/01/2021 07/20/2020  Weight (lbs) 343 lb 7.6 oz 330 lb 11 oz 348 lb 12.8 oz  Weight (kg) 155.8 kg 150 kg 158.215 kg      Telemetry    Sinus - Personally Reviewed  ECG     - Personally Reviewed  Physical Exam   GEN: No acute distress.   Neck: No JVD Cardiac: RRR, no murmurs, rubs, or gallops.  Respiratory: Clear to auscultation bilaterally. GI: Soft, nontender, non-distended  MS: No edema; No deformity. Neuro:  Nonfocal  Psych: Normal affect   Labs    High Sensitivity Troponin:   Recent Labs  Lab 01/01/21 0645 01/01/21 1056  01/01/21 1311 01/01/21 1722  TROPONINIHS 17 56* 361* 1,847*      Chemistry Recent Labs  Lab 01/01/21 0645 01/02/21 0411  NA 136 134*  K 3.9 4.1  CL 105 106  CO2 19* 23  GLUCOSE 245* 160*  BUN 18 18  CREATININE 1.19 0.88  CALCIUM 9.5 8.8*  GFRNONAA >60 >60  ANIONGAP 12 5     Hematology Recent Labs  Lab 01/01/21 0645 01/02/21 0411  WBC 8.5 7.2  RBC 4.97 4.41  HGB 14.7 13.3  HCT 46.4 40.3  MCV 93.4 91.4  MCH 29.6 30.2  MCHC 31.7 33.0  RDW 13.9 14.0  PLT 218 192    BNP Recent Labs  Lab 01/01/21 0654  BNP 634.1*     DDimer No results for input(s): DDIMER in the last 168 hours.   Radiology    DG Chest 2 View  Result Date: 01/01/2021 CLINICAL DATA:  60 year old male with shortness of breath and chest tightness this morning. Status post COVID-19. 2 weeks ago. EXAM: CHEST - 2 VIEW COMPARISON:  Chest radiograph 12/20/2020 and earlier. FINDINGS: Continued stable lung volumes and mediastinal contours. Visualized tracheal air column is within normal limits. No pneumothorax, pulmonary edema, pleural effusion or consolidation. Chronic mild left lung base atelectasis or scarring  is stable since 2018. no acute pulmonary opacity. No acute osseous abnormality identified. Negative visible bowel gas pattern. IMPRESSION: No acute cardiopulmonary abnormality. Electronically Signed   By: Genevie Ann M.D.   On: 01/01/2021 07:26   CT Angio Chest PE W and/or Wo Contrast  Result Date: 01/01/2021 CLINICAL DATA:  60 year old male with chest pain, shortness of breath, recent COVID-19. History of DVT. EXAM: CT ANGIOGRAPHY CHEST WITH CONTRAST TECHNIQUE: Multidetector CT imaging of the chest was performed using the standard protocol during bolus administration of intravenous contrast. Multiplanar CT image reconstructions and MIPs were obtained to evaluate the vascular anatomy. CONTRAST:  191m OMNIPAQUE IOHEXOL 350 MG/ML SOLN COMPARISON:  Chest radiographs 0708 hours today. Prior chest CTA  03/23/2013. FINDINGS: Cardiovascular: Initially poor but ultimately excellent contrast bolus timing in the pulmonary arterial tree after repeat bolus injection at 1109 hours. No focal filling defect identified in the pulmonary arteries to suggest acute pulmonary embolism. Calcified coronary artery atherosclerosis and/or stents (series 17, image 60). No cardiomegaly or pericardial effusion. Negative visible aorta on the initial contrast bolus images. Mediastinum/Nodes: Negative.  No mediastinal lymphadenopathy. Lungs/Pleura: Major airways are patent with mild atelectatic changes. No consolidation or pleural effusion. Mild mosaic attenuation in both lungs appears compatible with mild atelectasis and gas trapping. No confluent or convincing inflammatory lung opacity. Upper Abdomen: Chronic hepatic steatosis. Negative visible gallbladder, spleen (chronic splenule), pancreas, adrenal glands, and bowel in the upper abdomen. Nephrolithiasis evident on series 6, image 170. No hydronephrosis is apparent. Musculoskeletal: Osteopenia. Subacute appearing fracture of the left lateral 10th rib on series 7, image 138 with partial healing. No acute osseous abnormality identified. Review of the MIP images confirms the above findings. IMPRESSION: 1. Negative for acute pulmonary embolus. No acute pulmonary finding aside from mild atelectasis and gas trapping. 2. Subacute appearing fracture of the left lateral 10th rib. 3. Calcified coronary artery atherosclerosis. 4. Chronic fatty liver disease. 5. Nephrolithiasis. Electronically Signed   By: HGenevie AnnM.D.   On: 01/01/2021 11:31   CARDIAC CATHETERIZATION  Result Date: 01/02/2021 Formatting of this result is different from the original.   2nd Sept lesion is 100% stenosed.  Right to left collaterals.   Mid RCA lesion is 95% stenosed.  A drug-eluting stent was successfully placed using a STENT ONYX FRONTIER 4.0X26, postdilated to 4.5 mm.   A drug-eluting stent was successfully placed  using a STENT ONYX FRONTIER 4.0X26.   Post intervention, there is a 0% residual stenosis.   The left ventricular systolic function is normal.   LV end diastolic pressure is normal.   The left ventricular ejection fraction is 55-65% by visual estimate.   There is no aortic valve stenosis. He will need aggressive secondary prevention including diabetes control, weight loss and a healthy, plant-based diet. Restart Eliquis tomorrow.  Aspirin for 1 month starting today.  Clopidogrel for 12 months starting today.  Cardiac rehab may be beneficial as well.   ECHOCARDIOGRAM LIMITED  Result Date: 01/01/2021    ECHOCARDIOGRAM LIMITED REPORT   [facility]   [facility] Exam: 01/01/2021 Medical Rec #:  [id number]         Height:       74.0 in Accession #:    [id number]        Weight:       330.7 lb Date of Birth:  [birth year]        BSA:          2.691 m  Patient Age:    27 years           BP:           122/78 mmHg Patient Gender: M                  HR:           78 bpm. Exam Location:  Inpatient Procedure: Limited Echo, Color Doppler and Cardiac Doppler Indications:    Chest pain  History:        Patient has prior history of Echocardiogram examinations, most                 recent 03/23/2013. Covid, Signs/Symptoms:Chest Pain and                 Shortness of Breath; Risk Factors:Sleep Apnea and Diabetes.  Sonographer:    Johny Chess Referring Phys: [id number] ALLISON WOLFE  Sonographer Comments: Patient is morbidly obese. Image acquisition challenging due to patient body habitus. IMPRESSIONS  1. Left ventricular ejection fraction, by estimation, is 55 to 60%. The left ventricle has normal function. The left ventricle has no regional wall motion abnormalities. Left ventricular diastolic parameters are consistent with Grade I diastolic dysfunction (impaired relaxation).  2. Right ventricular systolic function is normal. The right ventricular size is normal.  3. The mitral valve is normal in structure. No  evidence of mitral valve regurgitation. No evidence of mitral stenosis.  4. The aortic valve is tricuspid. Aortic valve regurgitation is not visualized. No aortic stenosis is present.  5. The inferior vena cava is normal in size with greater than 50% respiratory variability, suggesting right atrial pressure of 3 mmHg. FINDINGS  Left Ventricle: Left ventricular ejection fraction, by estimation, is 55 to 60%. The left ventricle has normal function. The left ventricle has no regional wall motion abnormalities. The left ventricular internal cavity size was normal in size. There is  no left ventricular hypertrophy. Left ventricular diastolic parameters are consistent with Grade I diastolic dysfunction (impaired relaxation). Right Ventricle: The right ventricular size is normal. Right ventricular systolic function is normal. Left Atrium: Left atrial size was normal in size. Right Atrium: Right atrial size was normal in size. Pericardium: There is no evidence of pericardial effusion. Mitral Valve: The mitral valve is normal in structure. No evidence of mitral valve stenosis. Tricuspid Valve: The tricuspid valve is normal in structure. Tricuspid valve regurgitation is trivial. No evidence of tricuspid stenosis. Aortic Valve: The aortic valve is tricuspid. Aortic valve regurgitation is not visualized. No aortic stenosis is present. Pulmonic Valve: The pulmonic valve was not well visualized. Pulmonic valve regurgitation is not visualized. No evidence of pulmonic stenosis. Aorta: The aortic root is normal in size and structure. Venous: The inferior vena cava is normal in size with greater than 50% respiratory variability, suggesting right atrial pressure of 3 mmHg. IAS/Shunts: No atrial level shunt detected by color flow Doppler. LEFT VENTRICLE PLAX 2D LVIDd:         4.60 cm Diastology LVIDs:         2.80 cm LV e' medial:    7.94 cm/s LV PW:         0.90 cm LV E/e' medial:  6.1 LV IVS:        0.90 cm LV e' lateral:   10.30  cm/s                        LV E/e' lateral: 4.7  LEFT ATRIUM         Index LA diam:    3.10 cm 1.15 cm/m  AORTIC VALVE LVOT Vmax:   72.80 cm/s LVOT Vmean:  47.600 cm/s LVOT VTI:    0.140 m MITRAL VALVE MV Area (PHT): 2.95 cm    SHUNTS MV Decel Time: 257 msec    Systemic VTI: 0.14 m MV E velocity: 48.40 cm/s MV A velocity: 45.40 cm/s MV E/A ratio:  1.07 Kirk Ruths MD Electronically signed by Kirk Ruths MD Signature Date/Time: 01/01/2021/6:13:27 PM    Final     Cardiac Studies     Patient Profile      Mr Defreese is a 60 y.o. male with a history of uncontrolled diabetes mellitus, HTN, hyperlipidemia, morbid obesity, history of DVT and pulmonary embolism in 2013, on Eliquis for anticoagulation and obstructive sleep apnea who was admitted with a NSTEMI. Cardiac cath 01/02/21 with severe mid RCA stenosis with thrombus. Successful PTCA/DES x 1 mid RCA  Assessment & Plan    CAD/NSTEMI: Pt admitted with NSTEMI. Cardiac cath today with severe mid RCA stenosis with thrombus. Successful PTCA/DES x 1 mid RCA. Will plan to continue DAPT with ASA and Plavix. Resume Eliquis tomorrow if no bleeding from cath site.     For questions or updates, please contact [place] Please consult [email or web address] for contact info under        Signed, Lauree Chandler, MD  01/02/2021, 10:42 AM

## 2021-01-02 NOTE — Progress Notes (Signed)
Pt doesn't want to wear CPAP 

## 2021-01-02 NOTE — Progress Notes (Signed)
Progress Note    Evan Leonard  ACZ:[id number] DOB: 12-Dec-1960  DOA: 01/01/2021 PCP: Shelva Majestic, MD      Brief Narrative:    Medical records reviewed and are as summarized below:  Evan Leonard is a 60 y.o. 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  His troponins were elevated.  He was admitted to the hospital for acute NSTEMI.  He was treated with IV heparin infusion.  He was evaluated by the cardiologist and he underwent left heart cath.  Drug-eluting stent was successfully placed to mid RCA.  2D echo showed EF estimated at 55 to 60%, grade 1 diastolic dysfunction.      Assessment/Plan:   Principal Problem:   Chest pain Active Problems:   Obstructive sleep apnea   DVT of left distal popliteal vein   Diabetes mellitus with hyperglycemia (HCC)   Dyspnea   NSTEMI (non-ST elevated myocardial infarction) (HCC)    Body mass index is 42.46 kg/m.  (Morbid obesity)  Acute NSTEMI: S/p left heart cath with placement of drug-eluting stent to mid RCA.  He has been started on aspirin and Plavix.  Continue Lipitor  Type 2 diabetes mellitus: Hemoglobin A1c is 9.5.  He said he stopped taking Jardiance for about a month also because he ran out of his prescription and he has to wait to see his PCP for free supply of Jardiance.  The importance of adequate glucose control was emphasized.  Morbid obesity: The importance of regular exercise, weight loss healthy eating habits was reiterated.  History of DVT and PE: Plan to resume Eliquis tomorrow.    Diet Order             Diet Carb Modified Fluid consistency: Thin; Room service appropriate? Yes  Diet effective now                      Consultants: Cardiologist  Procedures: Left heart cath    Medications:     aspirin EC  81 mg Oral Daily   atorvastatin  80 mg Oral Daily   [START ON 01/03/2021] clopidogrel  75 mg Oral Q breakfast   insulin aspart  0-15 Units Subcutaneous TID WC   multivitamin with minerals  1 tablet Oral Daily   [START ON 01/03/2021] rosuvastatin  40 mg Oral Once per day on Mon Wed Fri   sodium chloride flush  3 mL Intravenous Q12H   sodium chloride flush  3 mL Intravenous Q12H   Continuous Infusions:  sodium chloride 125 mL/hr at 01/02/21 0941   sodium chloride       Anti-infectives (From admission, onward)    None              Family Communication/Anticipated D/C date and plan/Code Status   DVT prophylaxis:      Code Status: Full Code  Family Communication: None Disposition Plan:    Status is: Inpatient  Remains inpatient appropriate because:Inpatient level of care appropriate due to severity of illness  Dispo: The patient is from: Home              Anticipated d/c is to: Home              Patient currently is not medically stable to d/c.   Difficult to place patient  No           Subjective:   Interval events noted.  No chest pain or shortness of breath.  He feels better.  Objective:    Vitals:   01/02/21 1040 01/02/21 1055 01/02/21 1125 01/02/21 1243  BP: (!) 144/72 138/72 (!) 135/91 (!) 150/85  Pulse: 76 73 79 88  Resp: 18 19 (!) 24 (!) 24  Temp:    98.7 F (37.1 C)  TempSrc:    Oral  SpO2: 94% 96% 97%   Weight:    (!) 154.1 kg  Height:    6\' 3"  (1.905 m)   No data found.   Intake/Output Summary (Last 24 hours) at 01/02/2021 1357 Last data filed at 01/02/2021 1100 Gross per 24 hour  Intake 355.38 ml  Output 2 ml  Net 353.38 ml   Filed Weights   01/01/21 0646 01/01/21 2300 01/02/21 1243  Weight: (!) 150 kg (!) 155.8 kg (!) 154.1 kg    Exam:  GEN: NAD SKIN: Warm and dry EYES: No pallor or icterus ENT: MMM CV: RRR PULM: CTA B ABD: soft, obese, NT, +BS CNS: AAO x 3, non focal EXT: Chronic erythema  bilateral legs with mild swelling.  No tenderness. TR band on right wrist.  No bleeding noted from right wrist       Data Reviewed:   I have personally reviewed following labs and imaging studies:  Labs: Labs show the following:   Basic Metabolic Panel: Recent Labs  Lab 01/01/21 0645 01/02/21 0411  NA 136 134*  K 3.9 4.1  CL 105 106  CO2 19* 23  GLUCOSE 245* 160*  BUN 18 18  CREATININE 1.19 0.88  CALCIUM 9.5 8.8*   GFR Estimated Creatinine Clearance: 140 mL/min (by C-G formula based on SCr of 0.88 mg/dL). Liver Function Tests: No results for input(s): AST, ALT, ALKPHOS, BILITOT, PROT, ALBUMIN in the last 168 hours. No results for input(s): LIPASE, AMYLASE in the last 168 hours. No results for input(s): AMMONIA in the last 168 hours. Coagulation profile Recent Labs  Lab 01/01/21 0645  INR 1.1    CBC: Recent Labs  Lab 01/01/21 0645 01/02/21 0411  WBC 8.5 7.2  NEUTROABS  --  3.7  HGB 14.7 13.3  HCT 46.4 40.3  MCV 93.4 91.4  PLT 218 192   Cardiac Enzymes: No results for input(s): CKTOTAL, CKMB, CKMBINDEX, TROPONINI in the last 168 hours. BNP (last 3 results) No results for input(s): PROBNP in the last 8760 hours. CBG: Recent Labs  Lab 01/01/21 1657 01/01/21 2315 01/02/21 0629 01/02/21 0930  GLUCAP 112* 153* 178* 159*   D-Dimer: No results for input(s): DDIMER in the last 72 hours. Hgb A1c: Recent Labs    01/01/21 1405  HGBA1C 9.5*   Lipid Profile: Recent Labs    01/02/21 0411  CHOL 151  HDL 22*  LDLCALC 90  TRIG 086*  CHOLHDL 6.9   Thyroid function studies: No results for input(s): TSH, T4TOTAL, T3FREE, THYROIDAB in the last 72 hours.  Invalid input(s): FREET3 Anemia work up: No results for input(s): VITAMINB12, FOLATE, FERRITIN, TIBC, IRON, RETICCTPCT in the last 72 hours. Sepsis Labs: Recent Labs  Lab 01/01/21 0645 01/02/21 0411  WBC 8.5 7.2    Microbiology No results found for this or any previous visit (from the past  240 hour(s)).  Procedures and diagnostic studies:  DG Chest 2 View  Result Date: 01/01/2021 CLINICAL DATA:  60 year old male with shortness of breath and chest tightness this  morning. Status post COVID-19. 2 weeks ago. EXAM: CHEST - 2 VIEW COMPARISON:  Chest radiograph 12/20/2020 and earlier. FINDINGS: Continued stable lung volumes and mediastinal contours. Visualized tracheal air column is within normal limits. No pneumothorax, pulmonary edema, pleural effusion or consolidation. Chronic mild left lung base atelectasis or scarring is stable since 2018. no acute pulmonary opacity. No acute osseous abnormality identified. Negative visible bowel gas pattern. IMPRESSION: No acute cardiopulmonary abnormality. Electronically Signed   By: Odessa Fleming M.D.   On: 01/01/2021 07:26   CT Angio Chest PE W and/or Wo Contrast  Result Date: 01/01/2021 CLINICAL DATA:  60 year old male with chest pain, shortness of breath, recent COVID-19. History of DVT. EXAM: CT ANGIOGRAPHY CHEST WITH CONTRAST TECHNIQUE: Multidetector CT imaging of the chest was performed using the standard protocol during bolus administration of intravenous contrast. Multiplanar CT image reconstructions and MIPs were obtained to evaluate the vascular anatomy. CONTRAST:  OMNIPAQUE IOHEXOL 350 MG/ML SOLN COMPARISON:  Chest radiographs 0708 hours today. Prior chest CTA 03/23/2013. FINDINGS: Cardiovascular: Initially poor but ultimately excellent contrast bolus timing in the pulmonary arterial tree after repeat bolus injection at 1109 hours. No focal filling defect identified in the pulmonary arteries to suggest acute pulmonary embolism. Calcified coronary artery atherosclerosis and/or stents (series 17, image 60). No cardiomegaly or pericardial effusion. Negative visible aorta on the initial contrast bolus images. Mediastinum/Nodes: Negative.  No mediastinal lymphadenopathy. Lungs/Pleura: Major airways are patent with mild atelectatic changes. No  consolidation or pleural effusion. Mild mosaic attenuation in both lungs appears compatible with mild atelectasis and gas trapping. No confluent or convincing inflammatory lung opacity. Upper Abdomen: Chronic hepatic steatosis. Negative visible gallbladder, spleen (chronic splenule), pancreas, adrenal glands, and bowel in the upper abdomen. Nephrolithiasis evident on series 6, image 170. No hydronephrosis is apparent. Musculoskeletal: Osteopenia. Subacute appearing fracture of the left lateral 10th rib on series 7, image 138 with partial healing. No acute osseous abnormality identified. Review of the MIP images confirms the above findings. IMPRESSION: 1. Negative for acute pulmonary embolus. No acute pulmonary finding aside from mild atelectasis and gas trapping. 2. Subacute appearing fracture of the left lateral 10th rib. 3. Calcified coronary artery atherosclerosis. 4. Chronic fatty liver disease. 5. Nephrolithiasis. Electronically Signed   By: Odessa Fleming M.D.   On: 01/01/2021 11:31   CARDIAC CATHETERIZATION  Result Date: 01/02/2021 Formatting of this result is different from the original.   2nd Sept lesion is 100% stenosed.  Right to left collaterals.   Mid RCA lesion is 95% stenosed.  A drug-eluting stent was successfully placed using a STENT ONYX FRONTIER 4.0X26, postdilated to 4.5 mm.   A drug-eluting stent was successfully placed using a STENT ONYX FRONTIER 4.0X26.   Post intervention, there is a 0% residual stenosis.   The left ventricular systolic function is normal.   LV end diastolic pressure is normal.   The left ventricular ejection fraction is 55-65% by visual estimate.   There is no aortic valve stenosis. He will need aggressive secondary prevention including diabetes control, weight loss and a healthy, plant-based diet. Restart Eliquis tomorrow.  Aspirin for 1 month starting today.  Clopidogrel for 12 months starting today.  Cardiac rehab may be beneficial as well.   ECHOCARDIOGRAM  LIMITED  Result Date: 01/01/2021    ECHOCARDIOGRAM LIMITED REPORT   Patient Name:   LARICO BLOEDORN [facility] Date of Exam: 01/01/2021 Medical Rec #:  [id number]          Height:  74.0 in Accession #:    [id number]         Weight:       330.7 lb Date of Birth:  1961-04-04         BSA:          2.691 m Patient Age:    59 years           BP:           122/78 mmHg Patient Gender: M                  HR:           78 bpm. Exam Location:  Inpatient Procedure: Limited Echo, Color Doppler and Cardiac Doppler Indications:    Chest pain  History:        Patient has prior history of Echocardiogram examinations, most                 recent 03/23/2013. Covid, Signs/Symptoms:Chest Pain and                 Shortness of Breath; Risk Factors:Sleep Apnea and Diabetes.  Sonographer:    Delcie Roch Referring Phys: [id number] ALLISON WOLFE  Sonographer Comments: Patient is morbidly obese. Image acquisition challenging due to patient body habitus. IMPRESSIONS  1. Left ventricular ejection fraction, by estimation, is 55 to 60%. The left ventricle has normal function. The left ventricle has no regional wall motion abnormalities. Left ventricular diastolic parameters are consistent with Grade I diastolic dysfunction (impaired relaxation).  2. Right ventricular systolic function is normal. The right ventricular size is normal.  3. The mitral valve is normal in structure. No evidence of mitral valve regurgitation. No evidence of mitral stenosis.  4. The aortic valve is tricuspid. Aortic valve regurgitation is not visualized. No aortic stenosis is present.  5. The inferior vena cava is normal in size with greater than 50% respiratory variability, suggesting right atrial pressure of 3 mmHg. FINDINGS  Left Ventricle: Left ventricular ejection fraction, by estimation, is 55 to 60%. The left ventricle has normal function. The left ventricle has no regional wall motion abnormalities. The left ventricular internal cavity size was normal in size.  There is  no left ventricular hypertrophy. Left ventricular diastolic parameters are consistent with Grade I diastolic dysfunction (impaired relaxation). Right Ventricle: The right ventricular size is normal. Right ventricular systolic function is normal. Left Atrium: Left atrial size was normal in size. Right Atrium: Right atrial size was normal in size. Pericardium: There is no evidence of pericardial effusion. Mitral Valve: The mitral valve is normal in structure. No evidence of mitral valve stenosis. Tricuspid Valve: The tricuspid valve is normal in structure. Tricuspid valve regurgitation is trivial. No evidence of tricuspid stenosis. Aortic Valve: The aortic valve is tricuspid. Aortic valve regurgitation is not visualized. No aortic stenosis is present. Pulmonic Valve: The pulmonic valve was not well visualized. Pulmonic valve regurgitation is not visualized. No evidence of pulmonic stenosis. Aorta: The aortic root is normal in size and structure. Venous: The inferior vena cava is normal in size with greater than 50% respiratory variability, suggesting right atrial pressure of 3 mmHg. IAS/Shunts: No atrial level shunt detected by color flow Doppler. LEFT VENTRICLE PLAX 2D LVIDd:         4.60 cm Diastology LVIDs:         2.80 cm LV e' medial:    7.94 cm/s LV PW:         0.90 cm LV E/e'  medial:  6.1 LV IVS:        0.90 cm LV e' lateral:   10.30 cm/s                        LV E/e' lateral: 4.7  LEFT ATRIUM         Index LA diam:    3.10 cm 1.15 cm/m  AORTIC VALVE LVOT Vmax:   72.80 cm/s LVOT Vmean:  47.600 cm/s LVOT VTI:    0.140 m MITRAL VALVE MV Area (PHT): 2.95 cm    SHUNTS MV Decel Time: 257 msec    Systemic VTI: 0.14 m MV E velocity: 48.40 cm/s MV A velocity: 45.40 cm/s MV E/A ratio:  1.07 Olga Millers MD Electronically signed by Olga Millers MD Signature Date/Time: 01/01/2021/6:13:27 PM    Final                LOS: 0 days   Omunique Pederson  [facility]   Pager on [email or web address]. If  7PM-7AM, please contact night-coverage at [email or web address]     01/02/2021, 1:57 PM

## 2021-01-02 NOTE — Progress Notes (Signed)
Rec'd VO from [place] at Pharmacy to stop Hep gtt until 1000 01/02/21. Eliquis given to pt at sched time around 1215

## 2021-01-03 ENCOUNTER — Other Ambulatory Visit (HOSPITAL_COMMUNITY): Payer: Self-pay

## 2021-01-03 ENCOUNTER — Encounter (HOSPITAL_COMMUNITY): Payer: Self-pay | Admitting: Interventional Cardiology

## 2021-01-03 DIAGNOSIS — I82432 Acute embolism and thrombosis of left popliteal vein: Secondary | ICD-10-CM

## 2021-01-03 LAB — GLUCOSE, CAPILLARY
Glucose-Capillary: 165 mg/dL — ABNORMAL HIGH (ref 70–99)
Glucose-Capillary: 168 mg/dL — ABNORMAL HIGH (ref 70–99)

## 2021-01-03 LAB — BASIC METABOLIC PANEL
Anion gap: 7 (ref 5–15)
BUN: 12 mg/dL (ref 8–23)
CO2: 23 mmol/L (ref 22–32)
Calcium: 8.8 mg/dL — ABNORMAL LOW (ref 8.9–10.3)
Chloride: 105 mmol/L (ref 98–111)
Creatinine, Ser: 0.88 mg/dL (ref 0.61–1.24)
GFR, Estimated: >60 mL/min (ref >60)
Glucose, Bld: 168 mg/dL — ABNORMAL HIGH (ref 70–99)
Potassium: 4 mmol/L (ref 3.5–5.1)
Sodium: 135 mmol/L (ref 135–145)

## 2021-01-03 LAB — CBC
HCT: 39.6 % (ref 39.0–52.0)
Hemoglobin: 12.9 g/dL — ABNORMAL LOW (ref 13.0–17.0)
MCH: 29.6 pg (ref 26.0–34.0)
MCHC: 32.6 g/dL (ref 30.0–36.0)
MCV: 90.8 fL (ref 80.0–100.0)
Platelets: 197 10*3/uL (ref 150–400)
RBC: 4.36 MIL/uL (ref 4.22–5.81)
RDW: 14 % (ref 11.5–15.5)
WBC: 8.1 10*3/uL (ref 4.0–10.5)
nRBC: 0 % (ref 0.0–0.2)

## 2021-01-03 MED ORDER — EMPAGLIFLOZIN 10 MG PO TABS
10.0000 mg | ORAL_TABLET | Freq: Every day | ORAL | 0 refills | Status: DC
  Filled 2021-01-03: qty 14, 14d supply, fill #0

## 2021-01-03 MED ORDER — NITROGLYCERIN 0.4 MG SL SUBL: 0.4000 mg | SUBLINGUAL_TABLET | SUBLINGUAL | Status: DC | PRN

## 2021-01-03 MED ORDER — PANTOPRAZOLE SODIUM 40 MG PO TBEC
40.0000 mg | DELAYED_RELEASE_TABLET | Freq: Every day | ORAL | 1 refills | Status: DC
  Filled 2021-01-03: qty 30, 30d supply, fill #0

## 2021-01-03 MED ORDER — PANTOPRAZOLE SODIUM 40 MG PO TBEC
40.0000 mg | DELAYED_RELEASE_TABLET | Freq: Every day | ORAL | Status: DC
  Administered 2021-01-03: 40 mg via ORAL
  Filled 2021-01-03: qty 1

## 2021-01-03 MED ORDER — CLOPIDOGREL BISULFATE 75 MG PO TABS
75.0000 mg | ORAL_TABLET | Freq: Every day | ORAL | 1 refills | Status: DC
  Filled 2021-01-03: qty 30, 30d supply, fill #0

## 2021-01-03 MED ORDER — METFORMIN HCL ER 500 MG PO TB24
1000.0000 mg | ORAL_TABLET | Freq: Two times a day (BID) | ORAL | 3 refills | Status: DC
Start: 2021-01-05 — End: 2021-07-12

## 2021-01-03 MED ORDER — NITROGLYCERIN 0.4 MG SL SUBL
0.4000 mg | SUBLINGUAL_TABLET | SUBLINGUAL | 1 refills | Status: DC | PRN
  Filled 2021-01-03: qty 25, 8d supply, fill #0

## 2021-01-03 MED ORDER — ATORVASTATIN CALCIUM 80 MG PO TABS
80.0000 mg | ORAL_TABLET | Freq: Every day | ORAL | 0 refills | Status: DC
  Filled 2021-01-03: qty 30, 30d supply, fill #0

## 2021-01-03 MED ORDER — METOPROLOL SUCCINATE ER 25 MG PO TB24
25.0000 mg | ORAL_TABLET | Freq: Every day | ORAL | Status: DC
  Administered 2021-01-03: 25 mg via ORAL
  Filled 2021-01-03: qty 1

## 2021-01-03 MED ORDER — ASPIRIN 81 MG PO TBEC
81.0000 mg | DELAYED_RELEASE_TABLET | Freq: Every day | ORAL | 0 refills | Status: DC
  Filled 2021-01-03: qty 30, 30d supply, fill #0

## 2021-01-03 MED ORDER — METOPROLOL SUCCINATE ER 25 MG PO TB24
25.0000 mg | ORAL_TABLET | Freq: Every day | ORAL | 0 refills | Status: DC
  Filled 2021-01-03: qty 30, 30d supply, fill #0

## 2021-01-03 NOTE — Progress Notes (Signed)
CARDIAC REHAB PHASE I   PRE:  Rate/Rhythm: 91 SR  BP:  Supine:   Sitting: 137/81  Standing:    SaO2: 95%RA  MODE:  Ambulation: 400 ft   POST:  Rate/Rhythm: 114 ST  BP:  Supine:   Sitting: 128/84  Standing:    SaO2: 98%RA [id number] Pt walked 400 ft on RA with steady gait and no CP. Tolerated well. Slightly SOB. MI education completed with pt who voiced understanding. Stressed importance of plavix with stent. Reviewed NTG use, MI restrictions, heart healthy and low carb food choices, walking for ex, and CRP 2. Pt works in [place] so will refer to CRP 2 there.    Graylon Good, RN BSN  01/03/2021 9:48 AM

## 2021-01-03 NOTE — Plan of Care (Signed)
  Problem: Education: Goal: Knowledge of General Education information will improve Description: Including pain rating scale, medication(s)/side effects and non-pharmacologic comfort measures Outcome: Progressing   Problem: Health Behavior/Discharge Planning: Goal: Ability to manage health-related needs will improve Outcome: Progressing   Problem: Clinical Measurements: Goal: Ability to maintain clinical measurements within normal limits will improve Outcome: Completed/Met   Problem: Nutrition: Goal: Adequate nutrition will be maintained Outcome: Completed/Met

## 2021-01-03 NOTE — Discharge Summary (Signed)
Physician Discharge Summary  Evan Leonard LGX:[id number] DOB: 09/11/1960 DOA: 01/01/2021  PCP: Evan Olp, MD  Admit date: 01/01/2021 Discharge date: 01/03/2021  Admitted From: Home Disposition:  Home  Recommendations for Outpatient Follow-up:  Follow up with PCP in 1-2 weeks Please obtain BMP/CBC in one week   Home Health:NO Equipment/Devices:No  Discharge Condition:Stable CODE STATUS:FULL Diet recommendation: Heart Healthy / Carb Modified   Brief/Interim Summary:   Evan Leonard is a 60 y.o. 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  His troponins were elevated.  He was admitted to the hospital for acute NSTEMI.  He was treated with IV heparin infusion.  He was evaluated by the cardiologist and he underwent left heart cath.  Drug-eluting stent was successfully placed to mid RCA.  2D echo showed EF estimated at 55 to 81%, grade 1 diastolic dysfunction.    Acute NSTEMI: S/p left heart cath with placement of drug-eluting stent to mid RCA.  He has been started on aspirin and Plavix.  Continue Lipitor, to put him in 1 month then to continue with Plavix alone as patient is back on Eliquis.  Type 2 diabetes mellitus: Hemoglobin A1c is 9.5.  Do metformin in couple days as he received contrast, given refill for Jardiance.  Morbid obesity: The importance of regular exercise, weight loss healthy eating habits was reiterated.  History of DVT and PE: Instructed to resume Eliquis on discharge.    Discharge Diagnoses:  Principal Problem:   Chest pain Active Problems:   Obstructive sleep apnea   DVT of left distal popliteal vein   Diabetes mellitus with hyperglycemia (HCC)   Dyspnea   NSTEMI (non-ST elevated myocardial infarction) [facility])    Discharge Instructions  Discharge  Instructions     AMB Referral to Heartcare Pharm-D   Complete by: As directed    Reason For Referral: Lipids   Amb Referral to Cardiac Rehabilitation   Complete by: As directed    Referring to [place] [facility] 2   Diagnosis:  NSTEMI Coronary Stents     After initial evaluation and assessments completed: Virtual Based Care may be provided alone or in conjunction with Phase 2 Cardiac Rehab based on patient barriers.: Yes   Diet - low sodium heart healthy   Complete by: As directed    Discharge instructions   Complete by: As directed    Follow with Primary MD Evan Olp, MD in 7 days   Get CBC, CMP,  checked  by Primary MD next visit.    Activity: As tolerated with Full fall precautions use walker/cane & assistance as needed   Disposition Home    Diet: Heart Healthy /carb modified , with feeding assistance and aspiration precautions.  For Heart failure patients - Check your Weight same time everyday, if you gain over 2 pounds, or you develop in leg swelling, experience more shortness of breath or chest pain, call your Primary MD immediately. Follow Cardiac Low Salt Diet and 1.5 lit/day fluid restriction.   On your next visit with your primary care physician please Get Medicines reviewed and adjusted.   Please request your Prim.MD to go over all Hospital Tests and Procedure/Radiological results at the follow up, please get all Hospital records sent to your Prim MD by signing hospital release before you go home.   If you  experience worsening of your admission symptoms, develop shortness of breath, life threatening emergency, suicidal or homicidal thoughts you must seek medical attention immediately by calling 911 or calling your MD immediately  if symptoms less severe.  You Must read complete instructions/literature along with all the possible adverse reactions/side effects for all the Medicines you take and that have been prescribed to you. Take any new Medicines after you have  completely understood and accpet all the possible adverse reactions/side effects.   Do not drive, operating heavy machinery, perform activities at heights, swimming or participation in water activities or provide baby sitting services if your were admitted for syncope or siezures until you have seen by Primary MD or a Neurologist and advised to do so again.  Do not drive when taking Pain medications.    Do not take more than prescribed Pain, Sleep and Anxiety Medications  Special Instructions: If you have smoked or chewed Tobacco  in the last 2 yrs please stop smoking, stop any regular Alcohol  and or any Recreational drug use.  Wear Seat belts while driving.   Please note  You were cared for by a hospitalist during your hospital stay. If you have any questions about your discharge medications or the care you received while you were in the hospital after you are discharged, you can call the unit and asked to speak with the hospitalist on call if the hospitalist that took care of you is not available. Once you are discharged, your primary care physician will handle any further medical issues. Please note that NO REFILLS for any discharge medications will be authorized once you are discharged, as it is imperative that you return to your primary care physician (or establish a relationship with a primary care physician if you do not have one) for your aftercare needs so that they can reassess your need for medications and monitor your lab values.   Increase activity slowly   Complete by: As directed       Allergies as of 01/03/2021   No Known Allergies      Medication List     STOP taking these medications    ciprofloxacin 500 MG tablet Commonly known as: CIPRO   rosuvastatin 40 MG tablet Commonly known as: CRESTOR   sildenafil 20 MG tablet Commonly known as: REVATIO       TAKE these medications    Aspirin Low Dose 81 MG EC tablet Generic drug: aspirin Take 1 tablet (81 mg  total) by mouth daily. Swallow whole.   atorvastatin 80 MG tablet Commonly known as: LIPITOR Take 1 tablet (80 mg total) by mouth daily. Start taking on: January 04, 2021   benzonatate 100 MG capsule Commonly known as: TESSALON Take 1-2 capsules (100-200 mg total) by mouth 3 (three) times daily as needed.   clopidogrel 75 MG tablet Commonly known as: PLAVIX Take 1 tablet (75 mg total) by mouth daily with breakfast. Start taking on: January 04, 2021   Eliquis 5 MG Tabs tablet Generic drug: apixaban TAKE 1 TABLET BY MOUTH TWICE A DAY What changed: how much to take   Jardiance 10 MG Tabs tablet Generic drug: empagliflozin Take 1 tablet (10 mg total) by mouth daily.   metFORMIN 500 MG 24 hr tablet Commonly known as: Glucophage XR Take 2 tablets (1,000 mg total) by mouth in the morning and at bedtime. Restart on 8/12 Start taking on: January 05, 2021 What changed:  additional instructions These instructions start on January 05, 2021. If  you are unsure what to do until then, ask your doctor or other care provider.   metoprolol succinate 25 MG 24 hr tablet Commonly known as: TOPROL-XL Take 1 tablet (25 mg total) by mouth daily.   multivitamin with minerals Tabs tablet Take 1 tablet by mouth daily.   nitroGLYCERIN 0.4 MG SL tablet Commonly known as: NITROSTAT Place 1 tablet (0.4 mg total) under the tongue every 5 (five) minutes as needed for chest pain.   pantoprazole 40 MG tablet Commonly known as: PROTONIX Take 1 tablet (40 mg total) by mouth daily. Start taking on: January 04, 2021   promethazine-dextromethorphan 6.25-15 MG/5ML syrup Commonly known as: PROMETHAZINE-DM Take 5 mLs by mouth at bedtime as needed for cough.   triamcinolone ointment 0.5 % Commonly known as: KENALOG Apply 1 application topically 2 (two) times daily. For 7-14 days maximum       ASK your doctor about these medications    Accu-Chek Guide test strip Generic drug: glucose blood USE AS  DIRECTED UP TO 4 TIMES DAILY   blood glucose meter kit and supplies Kit Dispense based on patient and insurance preference. Use up to four times daily as directed. E11.65   Ozempic (1 MG/DOSE) 2 MG/1.5ML Sopn Generic drug: Semaglutide (1 MG/DOSE) Inject 1 mg into the skin once a week.        Follow-up Information     Burnell Blanks, MD Follow up on 01/22/2021.   Specialty: Cardiology Why: at 8:30am for your follow up appt. Contact information: [address] [place] [address] [phone]                No Known Allergies  Consultations: cardiology   Procedures/Studies: DG Chest 2 View  Result Date: 01/01/2021 CLINICAL DATA:  60 year old male with shortness of breath and chest tightness this morning. Status post COVID-19. 2 weeks ago. EXAM: CHEST - 2 VIEW COMPARISON:  Chest radiograph 12/20/2020 and earlier. FINDINGS: Continued stable lung volumes and mediastinal contours. Visualized tracheal air column is within normal limits. No pneumothorax, pulmonary edema, pleural effusion or consolidation. Chronic mild left lung base atelectasis or scarring is stable since 2018. no acute pulmonary opacity. No acute osseous abnormality identified. Negative visible bowel gas pattern. IMPRESSION: No acute cardiopulmonary abnormality. Electronically Signed   By: Genevie Ann M.D.   On: 01/01/2021 07:26   DG Chest 2 View  Result Date: 12/20/2020 CLINICAL DATA:  60 year old male with cough and chest pain. EXAM: CHEST - 2 VIEW COMPARISON:  Chest radiograph dated 11/18/2020. FINDINGS: Left lung base linear atelectasis/scarring. No focal consolidation, pleural effusion, or pneumothorax. The cardiac silhouette is within limits. No acute osseous pathology. IMPRESSION: No active cardiopulmonary disease. Electronically Signed   By: Anner Crete M.D.   On: 12/20/2020 17:36   CT Angio Chest PE W and/or Wo Contrast  Result Date: 01/01/2021 CLINICAL DATA:  60 year old male  with chest pain, shortness of breath, recent COVID-19. History of DVT. EXAM: CT ANGIOGRAPHY CHEST WITH CONTRAST TECHNIQUE: Multidetector CT imaging of the chest was performed using the standard protocol during bolus administration of intravenous contrast. Multiplanar CT image reconstructions and MIPs were obtained to evaluate the vascular anatomy. CONTRAST:  11m OMNIPAQUE IOHEXOL 350 MG/ML SOLN COMPARISON:  Chest radiographs 0708 hours today. Prior chest CTA 03/23/2013. FINDINGS: Cardiovascular: Initially poor but ultimately excellent contrast bolus timing in the pulmonary arterial tree after repeat bolus injection at 1109 hours. No focal filling defect identified in the pulmonary arteries to suggest acute pulmonary  embolism. Calcified coronary artery atherosclerosis and/or stents (series 17, image 60). No cardiomegaly or pericardial effusion. Negative visible aorta on the initial contrast bolus images. Mediastinum/Nodes: Negative.  No mediastinal lymphadenopathy. Lungs/Pleura: Major airways are patent with mild atelectatic changes. No consolidation or pleural effusion. Mild mosaic attenuation in both lungs appears compatible with mild atelectasis and gas trapping. No confluent or convincing inflammatory lung opacity. Upper Abdomen: Chronic hepatic steatosis. Negative visible gallbladder, spleen (chronic splenule), pancreas, adrenal glands, and bowel in the upper abdomen. Nephrolithiasis evident on series 6, image 170. No hydronephrosis is apparent. Musculoskeletal: Osteopenia. Subacute appearing fracture of the left lateral 10th rib on series 7, image 138 with partial healing. No acute osseous abnormality identified. Review of the MIP images confirms the above findings. IMPRESSION: 1. Negative for acute pulmonary embolus. No acute pulmonary finding aside from mild atelectasis and gas trapping. 2. Subacute appearing fracture of the left lateral 10th rib. 3. Calcified coronary artery atherosclerosis. 4. Chronic  fatty liver disease. 5. Nephrolithiasis. Electronically Signed   By: Genevie Ann M.D.   On: 01/01/2021 11:31   CARDIAC CATHETERIZATION  Result Date: 01/02/2021 Formatting of this result is different from the original.   2nd Sept lesion is 100% stenosed.  Right to left collaterals.   Mid RCA lesion is 95% stenosed.  A drug-eluting stent was successfully placed using a STENT ONYX FRONTIER 4.0X26, postdilated to 4.5 mm.   A drug-eluting stent was successfully placed using a STENT ONYX FRONTIER 4.0X26.   Post intervention, there is a 0% residual stenosis.   The left ventricular systolic function is normal.   LV end diastolic pressure is normal.   The left ventricular ejection fraction is 55-65% by visual estimate.   There is no aortic valve stenosis. He will need aggressive secondary prevention including diabetes control, weight loss and a healthy, plant-based diet. Restart Eliquis tomorrow.  Aspirin for 1 month starting today.  Clopidogrel for 12 months starting today.  Cardiac rehab may be beneficial as well.   ECHOCARDIOGRAM LIMITED  Result Date: 01/01/2021    ECHOCARDIOGRAM LIMITED REPORT   Patient Name:   Evan Leonard [facility] Date of Exam: 01/01/2021 Medical Rec #:  [id number]          Height:       74.0 in Accession #:    [id number]         Weight:       330.7 lb Date of Birth:  Sep 29, 1960         BSA:          2.691 m Patient Age:    59 years           BP:           122/78 mmHg Patient Gender: M                  HR:           78 bpm. Exam Location:  Inpatient Procedure: Limited Echo, Color Doppler and Cardiac Doppler Indications:    Chest pain  History:        Patient has prior history of Echocardiogram examinations, most                 recent 03/23/2013. Covid, Signs/Symptoms:Chest Pain and                 Shortness of Breath; Risk Factors:Sleep Apnea and Diabetes.  Sonographer:    Johny Chess Referring Phys: [id number] Orma Flaming  Sonographer Comments: Patient  is morbidly obese. Image acquisition  challenging due to patient body habitus. IMPRESSIONS  1. Left ventricular ejection fraction, by estimation, is 55 to 60%. The left ventricle has normal function. The left ventricle has no regional wall motion abnormalities. Left ventricular diastolic parameters are consistent with Grade I diastolic dysfunction (impaired relaxation).  2. Right ventricular systolic function is normal. The right ventricular size is normal.  3. The mitral valve is normal in structure. No evidence of mitral valve regurgitation. No evidence of mitral stenosis.  4. The aortic valve is tricuspid. Aortic valve regurgitation is not visualized. No aortic stenosis is present.  5. The inferior vena cava is normal in size with greater than 50% respiratory variability, suggesting right atrial pressure of 3 mmHg. FINDINGS  Left Ventricle: Left ventricular ejection fraction, by estimation, is 55 to 60%. The left ventricle has normal function. The left ventricle has no regional wall motion abnormalities. The left ventricular internal cavity size was normal in size. There is  no left ventricular hypertrophy. Left ventricular diastolic parameters are consistent with Grade I diastolic dysfunction (impaired relaxation). Right Ventricle: The right ventricular size is normal. Right ventricular systolic function is normal. Left Atrium: Left atrial size was normal in size. Right Atrium: Right atrial size was normal in size. Pericardium: There is no evidence of pericardial effusion. Mitral Valve: The mitral valve is normal in structure. No evidence of mitral valve stenosis. Tricuspid Valve: The tricuspid valve is normal in structure. Tricuspid valve regurgitation is trivial. No evidence of tricuspid stenosis. Aortic Valve: The aortic valve is tricuspid. Aortic valve regurgitation is not visualized. No aortic stenosis is present. Pulmonic Valve: The pulmonic valve was not well visualized. Pulmonic valve regurgitation is not visualized. No evidence of pulmonic  stenosis. Aorta: The aortic root is normal in size and structure. Venous: The inferior vena cava is normal in size with greater than 50% respiratory variability, suggesting right atrial pressure of 3 mmHg. IAS/Shunts: No atrial level shunt detected by color flow Doppler. LEFT VENTRICLE PLAX 2D LVIDd:         4.60 cm Diastology LVIDs:         2.80 cm LV e' medial:    7.94 cm/s LV PW:         0.90 cm LV E/e' medial:  6.1 LV IVS:        0.90 cm LV e' lateral:   10.30 cm/s                        LV E/e' lateral: 4.7  LEFT ATRIUM         Index LA diam:    3.10 cm 1.15 cm/m  AORTIC VALVE LVOT Vmax:   72.80 cm/s LVOT Vmean:  47.600 cm/s LVOT VTI:    0.140 m MITRAL VALVE MV Area (PHT): 2.95 cm    SHUNTS MV Decel Time: 257 msec    Systemic VTI: 0.14 m MV E velocity: 48.40 cm/s MV A velocity: 45.40 cm/s MV E/A ratio:  1.07 Kirk Ruths MD Electronically signed by Kirk Ruths MD Signature Date/Time: 01/01/2021/6:13:27 PM    Final       Subjective: He denies any chest pain, shortness of breath, no nausea, no vomiting, no fever.  Discharge Exam: Vitals:   01/03/21 0739 01/03/21 0900  BP: 133/79 137/81  Pulse: 79   Resp: 16   Temp: 98.2 F (36.8 C)   SpO2:  97%   Vitals:   01/03/21 0053 01/03/21 0605 01/03/21  0739 01/03/21 0900  BP: (!) 144/95 135/83 133/79 137/81  Pulse: 79 77 79   Resp: '17 18 16   ' Temp: 98.1 F (36.7 C) 98 F (36.7 C) 98.2 F (36.8 C)   TempSrc: Oral Oral Oral   SpO2: 94% 96%  97%  Weight:      Height:        General: Pt is alert, awake, not in acute distress Cardiovascular: RRR, S1/S2 +, no rubs, no gallops Respiratory: CTA bilaterally, no wheezing, no rhonchi Abdominal: Soft, NT, ND, bowel sounds + Extremities: no edema, no cyanosis    The results of significant diagnostics from this hospitalization (including imaging, microbiology, ancillary and laboratory) are listed below for reference.     Microbiology: No results found for this or any previous visit (from  the past 240 hour(s)).   Labs: BNP (last 3 results) Recent Labs    12/11/20 1728 01/01/21 0654  BNP 30.1 168.3*   Basic Metabolic Panel: Recent Labs  Lab 01/01/21 0645 01/02/21 0411 01/03/21 0326  NA 136 134* 135  K 3.9 4.1 4.0  CL 105 106 105  CO2 19* 23 23  GLUCOSE 245* 160* 168*  BUN '18 18 12  ' CREATININE 1.19 0.88 0.88  CALCIUM 9.5 8.8* 8.8*   Liver Function Tests: No results for input(s): AST, ALT, ALKPHOS, BILITOT, PROT, ALBUMIN in the last 168 hours. No results for input(s): LIPASE, AMYLASE in the last 168 hours. No results for input(s): AMMONIA in the last 168 hours. CBC: Recent Labs  Lab 01/01/21 0645 01/02/21 0411 01/03/21 0326  WBC 8.5 7.2 8.1  NEUTROABS  --  3.7  --   HGB 14.7 13.3 12.9*  HCT 46.4 40.3 39.6  MCV 93.4 91.4 90.8  PLT 218 192 197   Cardiac Enzymes: No results for input(s): CKTOTAL, CKMB, CKMBINDEX, TROPONINI in the last 168 hours. BNP: Invalid input(s): POCBNP CBG: Recent Labs  Lab 01/02/21 0930 01/02/21 1806 01/02/21 2202 01/03/21 0737 01/03/21 1130  GLUCAP 159* 119* 184* 165* 168*   D-Dimer No results for input(s): DDIMER in the last 72 hours. Hgb A1c Recent Labs    01/01/21 1405  HGBA1C 9.5*   Lipid Profile Recent Labs    01/02/21 0411  CHOL 151  HDL 22*  LDLCALC 90  TRIG 194*  CHOLHDL 6.9   Thyroid function studies No results for input(s): TSH, T4TOTAL, T3FREE, THYROIDAB in the last 72 hours.  Invalid input(s): FREET3 Anemia work up No results for input(s): VITAMINB12, FOLATE, FERRITIN, TIBC, IRON, RETICCTPCT in the last 72 hours. Urinalysis    Component Value Date/Time   COLORURINE AMBER (A) 08/20/2019 2313   APPEARANCEUR HAZY (A) 08/20/2019 2313   LABSPEC 1.020 12/20/2020 1727   PHURINE 5.5 12/20/2020 1727   GLUCOSEU NEGATIVE 12/20/2020 1727   HGBUR LARGE (A) 12/20/2020 1727   BILIRUBINUR NEGATIVE 12/20/2020 1727   BILIRUBINUR negative 08/18/2019 [place] 12/20/2020 1727    PROTEINUR NEGATIVE 12/20/2020 1727   UROBILINOGEN 0.2 12/20/2020 1727   NITRITE NEGATIVE 12/20/2020 1727   LEUKOCYTESUR NEGATIVE 12/20/2020 1727   Sepsis Labs Invalid input(s): PROCALCITONIN,  WBC,  LACTICIDVEN Microbiology No results found for this or any previous visit (from the past 240 hour(s)).   Time coordinating discharge: Over 30 minutes  SIGNED:   Phillips Climes, MD  [facility] 01/03/2021, 3:55 PM Pager   If 7PM-7AM, please contact night-coverage [email or web address] Password [email or web address]

## 2021-01-03 NOTE — Discharge Instructions (Signed)
Follow with Primary MD [name], MD in 7 days   Get CBC, CMP, checked  by Primary MD next visit.    Activity: As tolerated with Full fall precautions use walker/cane & assistance as needed   Disposition Home    Diet: Heart Healthy /carb modified, with feeding assistance and aspiration precautions.  For Heart failure patients - Check your Weight same time everyday, if you gain over 2 pounds, or you develop in leg swelling, experience more shortness of breath or chest pain, call your Primary MD immediately. Follow Cardiac Low Salt Diet and 1.5 lit/day fluid restriction.   On your next visit with your primary care physician please Get Medicines reviewed and adjusted.   Please request your Prim.MD to go over all Hospital Tests and Procedure/Radiological results at the follow up, please get all Hospital records sent to your Prim MD by signing hospital release before you go home.   If you experience worsening of your admission symptoms, develop shortness of breath, life threatening emergency, suicidal or homicidal thoughts you must seek medical attention immediately by calling 911 or calling your MD immediately  if symptoms less severe.  You Must read complete instructions/literature along with all the possible adverse reactions/side effects for all the Medicines you take and that have been prescribed to you. Take any new Medicines after you have completely understood and accpet all the possible adverse reactions/side effects.   Do not drive, operating heavy machinery, perform activities at heights, swimming or participation in water activities or provide baby sitting services if your were admitted for syncope or siezures until you have seen by Primary MD or a Neurologist and advised to do so again.  Do not drive when taking Pain medications.    Do not take more than prescribed Pain, Sleep and Anxiety Medications  Special Instructions: If you have smoked or chewed Tobacco  in the  last 2 yrs please stop smoking, stop any regular Alcohol  and or any Recreational drug use.  Wear Seat belts while driving.   Please note  You were cared for by a hospitalist during your hospital stay. If you have any questions about your discharge medications or the care you received while you were in the hospital after you are discharged, you can call the unit and asked to speak with the hospitalist on call if the hospitalist that took care of you is not available. Once you are discharged, your primary care physician will handle any further medical issues. Please note that NO REFILLS for any discharge medications will be authorized once you are discharged, as it is imperative that you return to your primary care physician (or establish a relationship with a primary care physician if you do not have one) for your aftercare needs so that they can reassess your need for medications and monitor your lab values.

## 2021-01-03 NOTE — Progress Notes (Addendum)
Progress Note  Patient Name: Evan Leonard Date of Encounter: 01/03/2021  [facility] Cardiologist: Lauree Chandler, MD   Subjective   No complaints overnight.   Inpatient Medications    Scheduled Meds:  aspirin EC  81 mg Oral Daily   atorvastatin  80 mg Oral Daily   clopidogrel  75 mg Oral Q breakfast   insulin aspart  0-15 Units Subcutaneous TID WC   multivitamin with minerals  1 tablet Oral Daily   pantoprazole  40 mg Oral Daily   rosuvastatin  40 mg Oral Once per day on Mon Wed Fri   sodium chloride flush  3 mL Intravenous Q12H   sodium chloride flush  3 mL Intravenous Q12H   Continuous Infusions:  sodium chloride     PRN Meds: sodium chloride, acetaminophen **OR** acetaminophen, acetaminophen, benzonatate, guaiFENesin-dextromethorphan, HYDROcodone-acetaminophen, ondansetron (ZOFRAN) IV, sodium chloride flush   Vital Signs    Vitals:   01/02/21 2200 01/03/21 0053 01/03/21 0605 01/03/21 0739  BP: 133/86 (!) 144/95 135/83 133/79  Pulse: 86 79 77 79  Resp: '20 17 18 16  '$ Temp: 97.9 F (36.6 C) 98.1 F (36.7 C) 98 F (36.7 C) 98.2 F (36.8 C)  TempSrc: Oral Oral Oral Oral  SpO2: 95% 94% 96%   Weight:      Height:        Intake/Output Summary (Last 24 hours) at 01/03/2021 0753 Last data filed at 01/02/2021 1900 Gross per 24 hour  Intake 2712.09 ml  Output 1 ml  Net 2711.09 ml   Last 3 Weights 01/02/2021 01/01/2021 01/01/2021  Weight (lbs) 339 lb 11.2 oz 343 lb 7.6 oz 330 lb 11 oz  Weight (kg) 154.087 kg 155.8 kg 150 kg      Telemetry    SR - Personally Reviewed  ECG    SR - Personally Reviewed  Physical Exam   GEN: No acute distress.   Neck: No JVD Cardiac: RRR, no murmurs, rubs, or gallops.  Respiratory: Clear to auscultation bilaterally. GI: Soft, nontender, non-distended  MS: No edema; No deformity. Right radial site stable.  Neuro:  Nonfocal  Psych: Normal affect   Labs    High Sensitivity Troponin:   Recent Labs  Lab  01/01/21 0645 01/01/21 1056 01/01/21 1311 01/01/21 1722  TROPONINIHS 17 56* 361* 1,847*      Chemistry Recent Labs  Lab 01/01/21 0645 01/02/21 0411 01/03/21 0326  NA 136 134* 135  K 3.9 4.1 4.0  CL 105 106 105  CO2 19* 23 23  GLUCOSE 245* 160* 168*  BUN '18 18 12  '$ CREATININE 1.19 0.88 0.88  CALCIUM 9.5 8.8* 8.8*  GFRNONAA >60 >60 >60  ANIONGAP '12 5 7     '$ Hematology Recent Labs  Lab 01/01/21 0645 01/02/21 0411 01/03/21 0326  WBC 8.5 7.2 8.1  RBC 4.97 4.41 4.36  HGB 14.7 13.3 12.9*  HCT 46.4 40.3 39.6  MCV 93.4 91.4 90.8  MCH 29.6 30.2 29.6  MCHC 31.7 33.0 32.6  RDW 13.9 14.0 14.0  PLT 218 192 197    BNP Recent Labs  Lab 01/01/21 0654  BNP 634.1*     DDimer No results for input(s): DDIMER in the last 168 hours.   Radiology    CT Angio Chest PE W and/or Wo Contrast  Result Date: 01/01/2021 CLINICAL DATA:  60 year old male with chest pain, shortness of breath, recent COVID-19. History of DVT. EXAM: CT ANGIOGRAPHY CHEST WITH CONTRAST TECHNIQUE: Multidetector CT imaging of the chest was performed  using the standard protocol during bolus administration of intravenous contrast. Multiplanar CT image reconstructions and MIPs were obtained to evaluate the vascular anatomy. CONTRAST:  137m OMNIPAQUE IOHEXOL 350 MG/ML SOLN COMPARISON:  Chest radiographs 0708 hours today. Prior chest CTA 03/23/2013. FINDINGS: Cardiovascular: Initially poor but ultimately excellent contrast bolus timing in the pulmonary arterial tree after repeat bolus injection at 1109 hours. No focal filling defect identified in the pulmonary arteries to suggest acute pulmonary embolism. Calcified coronary artery atherosclerosis and/or stents (series 17, image 60). No cardiomegaly or pericardial effusion. Negative visible aorta on the initial contrast bolus images. Mediastinum/Nodes: Negative.  No mediastinal lymphadenopathy. Lungs/Pleura: Major airways are patent with mild atelectatic changes. No  consolidation or pleural effusion. Mild mosaic attenuation in both lungs appears compatible with mild atelectasis and gas trapping. No confluent or convincing inflammatory lung opacity. Upper Abdomen: Chronic hepatic steatosis. Negative visible gallbladder, spleen (chronic splenule), pancreas, adrenal glands, and bowel in the upper abdomen. Nephrolithiasis evident on series 6, image 170. No hydronephrosis is apparent. Musculoskeletal: Osteopenia. Subacute appearing fracture of the left lateral 10th rib on series 7, image 138 with partial healing. No acute osseous abnormality identified. Review of the MIP images confirms the above findings. IMPRESSION: 1. Negative for acute pulmonary embolus. No acute pulmonary finding aside from mild atelectasis and gas trapping. 2. Subacute appearing fracture of the left lateral 10th rib. 3. Calcified coronary artery atherosclerosis. 4. Chronic fatty liver disease. 5. Nephrolithiasis. Electronically Signed   By: HGenevie AnnM.D.   On: 01/01/2021 11:31   CARDIAC CATHETERIZATION  Result Date: 01/02/2021 Formatting of this result is different from the original.   2nd Sept lesion is 100% stenosed.  Right to left collaterals.   Mid RCA lesion is 95% stenosed.  A drug-eluting stent was successfully placed using a STENT ONYX FRONTIER 4.0X26, postdilated to 4.5 mm.   A drug-eluting stent was successfully placed using a STENT ONYX FRONTIER 4.0X26.   Post intervention, there is a 0% residual stenosis.   The left ventricular systolic function is normal.   LV end diastolic pressure is normal.   The left ventricular ejection fraction is 55-65% by visual estimate.   There is no aortic valve stenosis. He will need aggressive secondary prevention including diabetes control, weight loss and a healthy, plant-based diet. Restart Eliquis tomorrow.  Aspirin for 1 month starting today.  Clopidogrel for 12 months starting today.  Cardiac rehab may be beneficial as well.   ECHOCARDIOGRAM  LIMITED  Result Date: 01/01/2021    ECHOCARDIOGRAM LIMITED REPORT   [facility]   [facility] L[facility]ate of Exam: 01/01/2021 Medical Rec #:  [id number]         Height:       74.0 in Accession #:    [id number]        Weight:       330.7 lb Date of Birth:  [birth year]        BSA:          2.691 m Patient Age:    89years           BP:           122/78 mmHg Patient Gender: M                  HR:           78 bpm. Exam Location:  Inpatient Procedure: Limited Echo, Color Doppler and Cardiac Doppler Indications:    Chest pain  History:        Patient has prior history of Echocardiogram examinations, most                 recent 03/23/2013. Covid, Signs/Symptoms:Chest Pain and                 Shortness of Breath; Risk Factors:Sleep Apnea and Diabetes.  Sonographer:    Johny Chess Referring Phys: [id number] ALLISON WOLFE  Sonographer Comments: Patient is morbidly obese. Image acquisition challenging due to patient body habitus. IMPRESSIONS  1. Left ventricular ejection fraction, by estimation, is 55 to 60%. The left ventricle has normal function. The left ventricle has no regional wall motion abnormalities. Left ventricular diastolic parameters are consistent with Grade I diastolic dysfunction (impaired relaxation).  2. Right ventricular systolic function is normal. The right ventricular size is normal.  3. The mitral valve is normal in structure. No evidence of mitral valve regurgitation. No evidence of mitral stenosis.  4. The aortic valve is tricuspid. Aortic valve regurgitation is not visualized. No aortic stenosis is present.  5. The inferior vena cava is normal in size with greater than 50% respiratory variability, suggesting right atrial pressure of 3 mmHg. FINDINGS  Left Ventricle: Left ventricular ejection fraction, by estimation, is 55 to 60%. The left ventricle has normal function. The left ventricle has no regional wall motion abnormalities. The left ventricular internal cavity size was normal in size.  There is  no left ventricular hypertrophy. Left ventricular diastolic parameters are consistent with Grade I diastolic dysfunction (impaired relaxation). Right Ventricle: The right ventricular size is normal. Right ventricular systolic function is normal. Left Atrium: Left atrial size was normal in size. Right Atrium: Right atrial size was normal in size. Pericardium: There is no evidence of pericardial effusion. Mitral Valve: The mitral valve is normal in structure. No evidence of mitral valve stenosis. Tricuspid Valve: The tricuspid valve is normal in structure. Tricuspid valve regurgitation is trivial. No evidence of tricuspid stenosis. Aortic Valve: The aortic valve is tricuspid. Aortic valve regurgitation is not visualized. No aortic stenosis is present. Pulmonic Valve: The pulmonic valve was not well visualized. Pulmonic valve regurgitation is not visualized. No evidence of pulmonic stenosis. Aorta: The aortic root is normal in size and structure. Venous: The inferior vena cava is normal in size with greater than 50% respiratory variability, suggesting right atrial pressure of 3 mmHg. IAS/Shunts: No atrial level shunt detected by color flow Doppler. LEFT VENTRICLE PLAX 2D LVIDd:         4.60 cm Diastology LVIDs:         2.80 cm LV e' medial:    7.94 cm/s LV PW:         0.90 cm LV E/e' medial:  6.1 LV IVS:        0.90 cm LV e' lateral:   10.30 cm/s                        LV E/e' lateral: 4.7  LEFT ATRIUM         Index LA diam:    3.10 cm 1.15 cm/m  AORTIC VALVE LVOT Vmax:   72.80 cm/s LVOT Vmean:  47.600 cm/s LVOT VTI:    0.140 m MITRAL VALVE MV Area (PHT): 2.95 cm    SHUNTS MV Decel Time: 257 msec    Systemic VTI: 0.14 m MV E velocity: 48.40 cm/s MV A velocity: 45.40 cm/s MV E/A ratio:  1.07 Kirk Ruths MD Electronically  signed by Kirk Ruths MD Signature Date/Time: 01/01/2021/6:13:27 PM    Final     Cardiac Studies   Cath: 01/02/21  2nd Sept lesion is 100% stenosed.  Right to left collaterals.    Mid RCA lesion is 95% stenosed.  A drug-eluting stent was successfully placed using a STENT ONYX FRONTIER 4.0X26, postdilated to 4.5 mm.   A drug-eluting stent was successfully placed using a STENT ONYX FRONTIER 4.0X26.   Post intervention, there is a 0% residual stenosis.   The left ventricular systolic function is normal.   LV end diastolic pressure is normal.   The left ventricular ejection fraction is 55-65% by visual estimate.   There is no aortic valve stenosis.   He will need aggressive secondary prevention including diabetes control, weight loss and a healthy, plant-based diet.   Restart Eliquis tomorrow.  Aspirin for 1 month starting today.  Clopidogrel for 12 months starting today.  Cardiac rehab may be beneficial as well.  Diagnostic Dominance: Right    Intervention      Echo: 01/01/21  IMPRESSIONS     1. Left ventricular ejection fraction, by estimation, is 55 to 60%. The  left ventricle has normal function. The left ventricle has no regional  wall motion abnormalities. Left ventricular diastolic parameters are  consistent with Grade I diastolic  dysfunction (impaired relaxation).   2. Right ventricular systolic function is normal. The right ventricular  size is normal.   3. The mitral valve is normal in structure. No evidence of mitral valve  regurgitation. No evidence of mitral stenosis.   4. The aortic valve is tricuspid. Aortic valve regurgitation is not  visualized. No aortic stenosis is present.   5. The inferior vena cava is normal in size with greater than 50%  respiratory variability, suggesting right atrial pressure of 3 mmHg.   FINDINGS   Left Ventricle: Left ventricular ejection fraction, by estimation, is 55  to 60%. The left ventricle has normal function. The left ventricle has no  regional wall motion abnormalities. The left ventricular internal cavity  size was normal in size. There is   no left ventricular hypertrophy. Left ventricular  diastolic parameters  are consistent with Grade I diastolic dysfunction (impaired relaxation).   Right Ventricle: The right ventricular size is normal. Right ventricular  systolic function is normal.   Left Atrium: Left atrial size was normal in size.   Right Atrium: Right atrial size was normal in size.   Pericardium: There is no evidence of pericardial effusion.   Mitral Valve: The mitral valve is normal in structure. No evidence of  mitral valve stenosis.   Tricuspid Valve: The tricuspid valve is normal in structure. Tricuspid  valve regurgitation is trivial. No evidence of tricuspid stenosis.   Aortic Valve: The aortic valve is tricuspid. Aortic valve regurgitation is  not visualized. No aortic stenosis is present.   Pulmonic Valve: The pulmonic valve was not well visualized. Pulmonic valve  regurgitation is not visualized. No evidence of pulmonic stenosis.   Aorta: The aortic root is normal in size and structure.   Venous: The inferior vena cava is normal in size with greater than 50%  respiratory variability, suggesting right atrial pressure of 3 mmHg.   IAS/Shunts: No atrial level shunt detected by color flow Doppler.   Patient Profile     60 y.o. male with a hx of uncontrolled diabetes mellitus, prior hypertension (currently not on antihypertensive), hyperlipidemia, morbid obesity, history of DVT and pulmonary embolism in 2013, on  Eliquis for anticoagulation and obstructive sleep apnea who is being seen 01/01/2021 for the evaluation of chest pain at the request of Dr. Rogers Blocker.  Assessment & Plan    NSTEMI: hsTn peaked 1847. Underwent cardiac cath noted above with PCI/DES x1 to the [facility]. Placed on DAPT with ASA/plavix. Eliquis will be resumed this morning. Plan for triple therapy with ASA/plavix/eliquis for one month, then drop ASA. CR to see -- continue ASA, plavix, statin, add Toprol  HTN: was not on meds PTA -- will start Toprol  DM: Hgb A1c 9.5 -- on metformin,  jardiance and ozempic PTA  HLD: LDL 90 -- on crestor '40mg'$  3x/week PTA, increased to atorvastatin '80mg'$  daily  Hx of DVT: Eliquis was held for cath, resumed prior discharge  Will arrange for outpatient follow up in the office. Patient prefers to use the [facility] pharmacy at discharge.   For questions or updates, please contact [place] Please consult [email or web address] for contact info under        Signed, Reino Bellis, NP  01/03/2021, 7:53 AM    I have personally seen and examined this patient. I agree with the assessment and plan as outlined above.  He is doing well post PCI of the RCA. No chest pain. Will plan ASA/Plavix for one month then stop ASA since he will also be on Eliquis. OK to start Eliquis back today. OK to d/c home today. We will arrange f/u in our office.  Lauree Chandler 01/03/2021 10:34 AM

## 2021-01-04 ENCOUNTER — Telehealth: Payer: Self-pay

## 2021-01-04 NOTE — Telephone Encounter (Cosign Needed)
Transition Care Management Unsuccessful Follow-up Telephone Call  Date of discharge and from where:  [facility]   Attempts:  1st Attempt  Reason for unsuccessful TCM follow-up call:  Left voice message

## 2021-01-04 NOTE — Telephone Encounter (Signed)
Correction I will see patient on 01/09/2021-thanks for updated report

## 2021-01-04 NOTE — Telephone Encounter (Cosign Needed)
Transition Care Management Follow-up Telephone Call Date of discharge and from where: 01/03/21 from [facility]  How have you been since you were released from the hospital? Feeling better as days progress Any questions or concerns? No  Items Reviewed: Did the pt receive and understand the discharge instructions provided? Yes  Medications obtained and verified? Yes  Other? No  Any new allergies since your discharge? Yes  Dietary orders reviewed? Yes Do you have support at home? Yes   Home Care and Equipment/Supplies: Were home health services ordered? not applicable If so, what is the name of the agency?   Has the agency set up a time to come to the patient's home? not applicable Were any new equipment or medical supplies ordered?  No What is the name of the medical supply agency?  Were you able to get the supplies/equipment? not applicable Do you have any questions related to the use of the equipment or supplies? No  Functional Questionnaire: (I = Independent and D = Dependent) ADLs: I  Bathing/Dressing- I  Meal Prep- I  Eating- I  Maintaining continence- I  Transferring/Ambulation- I  Managing Meds- I  Follow up appointments reviewed:  PCP Hospital f/u appt confirmed? Yes  Scheduled to see Dr Yong Channel  on 11/09/20 @ 1:00 . [facility] f/u appt confirmed? Yes  Scheduled to see Lauree Chandler  on 01/22/21 @ 8:30. Are transportation arrangements needed? No  If their condition worsens, is the pt aware to call PCP or go to the Emergency Dept.? Yes Was the patient provided with contact information for the PCP's office or ED? Yes Was to pt encouraged to call back with questions or concerns? Yes

## 2021-01-04 NOTE — Telephone Encounter (Signed)
Pt called back to speak with Otila Kluver

## 2021-01-05 ENCOUNTER — Telehealth (HOSPITAL_COMMUNITY): Payer: Self-pay

## 2021-01-05 NOTE — Telephone Encounter (Signed)
Per phase I cardiac rehab, fax cardiac rehab referral to [place] [facility]. °

## 2021-01-09 ENCOUNTER — Encounter: Payer: Self-pay | Admitting: Family Medicine

## 2021-01-09 ENCOUNTER — Other Ambulatory Visit: Payer: Self-pay

## 2021-01-09 ENCOUNTER — Ambulatory Visit: Payer: No Typology Code available for payment source | Admitting: Family Medicine

## 2021-01-09 VITALS — BP 119/87 | HR 109 | Temp 97.2°F | Ht 74.0 in | Wt 327.4 lb

## 2021-01-09 DIAGNOSIS — E1169 Type 2 diabetes mellitus with other specified complication: Secondary | ICD-10-CM | POA: Diagnosis not present

## 2021-01-09 DIAGNOSIS — E785 Hyperlipidemia, unspecified: Secondary | ICD-10-CM

## 2021-01-09 DIAGNOSIS — R5383 Other fatigue: Secondary | ICD-10-CM

## 2021-01-09 DIAGNOSIS — Z79899 Other long term (current) drug therapy: Secondary | ICD-10-CM | POA: Diagnosis not present

## 2021-01-09 DIAGNOSIS — E1165 Type 2 diabetes mellitus with hyperglycemia: Secondary | ICD-10-CM | POA: Diagnosis not present

## 2021-01-09 DIAGNOSIS — I2510 Atherosclerotic heart disease of native coronary artery without angina pectoris: Secondary | ICD-10-CM

## 2021-01-09 DIAGNOSIS — Z6841 Body Mass Index (BMI) 40.0 and over, adult: Secondary | ICD-10-CM

## 2021-01-09 LAB — COMPREHENSIVE METABOLIC PANEL
ALT: 30 U/L (ref 0–53)
AST: 30 U/L (ref 0–37)
Albumin: 4.3 g/dL (ref 3.5–5.2)
Alkaline Phosphatase: 95 U/L (ref 39–117)
BUN: 17 mg/dL (ref 6–23)
CO2: 22 mEq/L (ref 19–32)
Calcium: 10 mg/dL (ref 8.4–10.5)
Chloride: 103 mEq/L (ref 96–112)
Creatinine, Ser: 1.25 mg/dL (ref 0.40–1.50)
GFR: 62.08 mL/min (ref >60.00)
Glucose, Bld: 111 mg/dL — ABNORMAL HIGH (ref 70–99)
Potassium: 4.3 mEq/L (ref 3.5–5.1)
Sodium: 138 mEq/L (ref 135–145)
Total Bilirubin: 0.6 mg/dL (ref 0.2–1.2)
Total Protein: 8.9 g/dL — ABNORMAL HIGH (ref 6.0–8.3)

## 2021-01-09 LAB — CBC WITH DIFFERENTIAL/PLATELET
Basophils Absolute: 0.1 10*3/uL (ref 0.0–0.1)
Basophils Relative: 0.7 % (ref 0.0–3.0)
Eosinophils Absolute: 0.3 10*3/uL (ref 0.0–0.7)
Eosinophils Relative: 2.9 % (ref 0.0–5.0)
HCT: 46.4 % (ref 39.0–52.0)
Hemoglobin: 15.4 g/dL (ref 13.0–17.0)
Lymphocytes Relative: 19.4 % (ref 12.0–46.0)
Lymphs Abs: 2.1 10*3/uL (ref 0.7–4.0)
MCHC: 33.2 g/dL (ref 30.0–36.0)
MCV: 89.4 fl (ref 78.0–100.0)
Monocytes Absolute: 0.8 10*3/uL (ref 0.1–1.0)
Monocytes Relative: 7 % (ref 3.0–12.0)
Neutro Abs: 7.7 10*3/uL (ref 1.4–7.7)
Neutrophils Relative %: 70 % (ref 43.0–77.0)
Platelets: 237 10*3/uL (ref 150.0–400.0)
RBC: 5.19 Mil/uL (ref 4.22–5.81)
RDW: 14.7 % (ref 11.5–15.5)
WBC: 11 10*3/uL — ABNORMAL HIGH (ref 4.0–10.5)

## 2021-01-09 LAB — MICROALBUMIN / CREATININE URINE RATIO
Creatinine,U: 164.6 mg/dL
Microalb Creat Ratio: 1.8 mg/g (ref 0.0–30.0)
Microalb, Ur: 3 mg/dL — ABNORMAL HIGH (ref 0.0–1.9)

## 2021-01-09 LAB — VITAMIN B12: Vitamin B-12: 411 pg/mL (ref 211–911)

## 2021-01-09 LAB — TSH: TSH: 2.71 u[IU]/mL (ref 0.35–5.50)

## 2021-01-09 MED ORDER — METOPROLOL SUCCINATE ER 50 MG PO TB24: 50.0000 mg | ORAL_TABLET | Freq: Every day | ORAL | 3 refills | Status: DC

## 2021-01-09 MED ORDER — ATORVASTATIN CALCIUM 80 MG PO TABS: 80.0000 mg | ORAL_TABLET | Freq: Every day | ORAL | 3 refills | Status: DC

## 2021-01-09 MED ORDER — EMPAGLIFLOZIN 10 MG PO TABS: 10.0000 mg | ORAL_TABLET | Freq: Every day | ORAL | 11 refills | Status: DC

## 2021-01-09 MED ORDER — OZEMPIC (0.25 OR 0.5 MG/DOSE) 2 MG/1.5ML ~~LOC~~ SOPN: PEN_INJECTOR | SUBCUTANEOUS | 0 refills | Status: DC

## 2021-01-09 NOTE — Patient Instructions (Addendum)
Health Maintenance Due  Topic Date Due   COLONOSCOPY (Pts 45-49yrs Insurance coverage will need to be confirmed) Patient states that he has the cologuard at home he will get it done.  Never done   Zoster Vaccines- Shingrix (1 of 2) - wait until overall improved from heart attack Never done   OPHTHALMOLOGY EXAM Will get this scheduled with his eye doctor. Please get this done as soon as possible. 04/26/2014   COVID-19 Vaccine (2 - Pfizer series) Will get this scheduled at his earliest convenience. Please consider the new variant vaccination when available..  11/14/2019   INFLUENZA VACCINE Not available in office YET. Please consider getting your flu shot in the Fall. If you get this outside of our office, please let us know. 12/25/2020   Please stop by lab before you go If you have mychart- we will send your results within 3 business days of us receiving them.  If you do not have mychart- we will call you about results within 5 business days of us receiving them.  *please also note that you will see labs on mychart as soon as they post. I will later go in and write notes on them- will say "notes from Dr. "  Start Ozempic 0.25 mg for a month. Then transition to 0.5 mg for a month. After that, you can transition to 1 mg for a month.  Increase metoprolol to 50 mg extended release. You may take 2 tablets of 25 mg dose until you run out  I want to congratulate you on the weight loss - keep up the good work with your diet and exercise!  If reflux issues do not improve, please let me know and we can place a referral to GI.  Please complete your Cologuard kit.  Recommended follow up: Return in about 14 weeks (around 04/17/2021) for follow-up or sooner if needed. 

## 2021-01-09 NOTE — Progress Notes (Signed)
Phone [phone] In person visit   Subjective:   Evan Leonard is a 60 y.o. year old very pleasant male patient who presents for/with See problem oriented charting Chief Complaint  Patient presents with   ED Follow Up     Patient states that he had a Miocardial infraction. He states that he's a little tired and light headed today    This visit occurred during the SARS-CoV-2 public health emergency.  Safety protocols were in place, including screening questions prior to the visit, additional usage of staff PPE, and extensive cleaning of exam room while observing appropriate contact time as indicated for disinfecting solutions.   Past Medical History-  Patient Active Problem List   Diagnosis Date Noted   CAD (coronary artery disease) 01/09/2021    Priority: High   Atypical chest pain 02/09/2018    Priority: High   Diabetes mellitus with hyperglycemia ([place]) 03/31/2013    Priority: High   Morbid obesity with body mass index of 40.0-44.9 in adult [facility]) 05/05/2012    Priority: High   DVT of left distal popliteal vein 05/05/2012    Priority: High   History of diabetic ulcer of foot 03/21/2018    Priority: Medium   S/P repair of recurrent ventral hernia 04/26/2017    Priority: Medium   Chronic venous insufficiency 01/25/2015    Priority: Medium   Obstructive sleep apnea 05/05/2012    Priority: Medium   Eczema 12/27/2019    Priority: Low   Erectile dysfunction 12/27/2019    Priority: Low   Renal stone 05/12/2017    Priority: Low   Hyponatremia 03/23/2013    Priority: Low   SBO (small bowel obstruction) ([place]) 08/20/2018    Medications- reviewed and updated Current Outpatient Medications  Medication Sig Dispense Refill   ACCU-CHEK GUIDE test strip USE AS DIRECTED UP TO 4 TIMES DAILY (Patient taking differently: 1 each by Other route 4 (four) times daily.) 100 strip 3   aspirin 81 MG EC tablet Take 1 tablet (81 mg total) by mouth daily. Swallow whole. 30 tablet 0    blood glucose meter kit and supplies KIT Dispense based on patient and insurance preference. Use up to four times daily as directed. E11.65 (Patient taking differently: Inject 1 each into the skin See admin instructions. Dispense based on patient and insurance preference. Use up to four times daily as directed. E11.65) 1 each 0   clopidogrel (PLAVIX) 75 MG tablet Take 1 tablet (75 mg total) by mouth daily with breakfast. 30 tablet 1   ELIQUIS 5 MG TABS tablet TAKE 1 TABLET BY MOUTH TWICE A DAY 60 tablet 5   metFORMIN (GLUCOPHAGE XR) 500 MG 24 hr tablet Take 2 tablets (1,000 mg total) by mouth in the morning and at bedtime. Restart on 8/12 180 tablet 3   Multiple Vitamin (MULTIVITAMIN WITH MINERALS) TABS tablet Take 1 tablet by mouth daily.      nitroGLYCERIN (NITROSTAT) 0.4 MG SL tablet Place 1 tablet (0.4 mg total) under the tongue every 5 (five) minutes as needed for chest pain. 25 tablet 1   pantoprazole (PROTONIX) 40 MG tablet Take 1 tablet (40 mg total) by mouth daily. 30 tablet 1   promethazine-dextromethorphan (PROMETHAZINE-DM) 6.25-15 MG/5ML syrup Take 5 mLs by mouth at bedtime as needed for cough. 100 mL 0   Semaglutide, 1 MG/DOSE, (OZEMPIC, 1 MG/DOSE,) 2 MG/1.5ML SOPN Inject 1 mg into the skin once a week. 3 mL 5   Semaglutide,0.25 or 0.5MG /DOS, (OZEMPIC, 0.25 OR 0.5  MG/DOSE,) 2 MG/1.5ML SOPN Inject 0.25 mg as directed once a week for 28 days, THEN 0.5 mg once a week for 14 days. 1.5 mL 0   triamcinolone ointment (KENALOG) 0.5 % Apply 1 application topically 2 (two) times daily. For 7-14 days maximum 60 g 1   atorvastatin (LIPITOR) 80 MG tablet Take 1 tablet (80 mg total) by mouth daily. 90 tablet 3   empagliflozin (JARDIANCE) 10 MG TABS tablet Take 1 tablet (10 mg total) by mouth daily. 30 tablet 11   metoprolol succinate (TOPROL-XL) 50 MG 24 hr tablet Take 1 tablet (50 mg total) by mouth daily. 90 tablet 3   No current facility-administered medications for this visit.     Objective:   BP 119/87   Pulse (!) 109   Temp (!) 97.2 F (36.2 C) (Temporal)   Ht $R'6\' 2"'Jn$  (1.88 m)   Wt (!) 327 lb 6.4 oz (148.5 kg)   SpO2 96%   BMI 42.04 kg/m  Gen: NAD, resting comfortably CV: slightly tachycardic ut regular no murmurs rubs or gallops Lungs: CTAB no crackles, wheeze, rhonchi Abdomen: soft/nontender other than mild tenderness with deep palpation in epigastric area, nondistended/normal bowel sounds. No rebound or guarding.  Ext: trace edema improved from prior baseline Skin: warm, dry    Assessment and Plan  # Hospital F/U for Exertional Dyspnea due to NSTEMI from CAD  S:Patient presented to the ED 01/01/2021 for chest pain. Troponins were elevated. He was admitted to for acute NSTEMI. Patient was then evaluated by the cardiologist and underwent a left heart cath procedure -  Drug-eluting stent was successfully placed to mid RCA. A 2D echo showed EF estimated at 55 to 60% with grade 1 diastolic dysfunction. He was also started on Aspirin and Plavix. Advised to continue aspirin for 1 month and then to continue on Plavix alone due to patient being back on Eliquis.  Appears his statin was changed from rosuvastatin 40 mg to atorvastatin 80 mg. He was also started on metoprolol 25 mg XR  For type II Dm, patient was advised to do metformin within a couple of days since he received contrast and he was given a refill for Jardiance.  Unfortunately diabetes with very poor control in the hospital even worsening compared to last visit.  In March of this year we had started Ozempic due to A1c of 8.3 with plan to titrate to 1 mg if tolerated-he reports never got this Lab Results  Component Value Date   HGBA1C 9.5 (H) 01/01/2021   HGBA1C 8.3 (H) 07/20/2020   HGBA1C 12.8 (A) 12/09/2019   For morbid obesity, importance of regular exercise, weight loss healthy eating habits was reiterated.  For his history of DVT and PE, patient was instructed to resume Eliquis on discharge.  We emphasized today  importance of avoiding falls given he is on aspirin Plavix and Eliquis currently  Patient was also seen in the ED 12/20/2020 for a viral [place] with a cough. 3-day acute onset - he reported having CP, SOB, fatigue, and sinus congestion. Denied any active chest pain or shortness of breath during the visit - his cough was generally mild. Patient is not fully vaccinated for COVID-19, not a smoker, and no alcohol or drug use. He was also seen on 11/18/2020 and was treated for community-acquired pneumonia.POC UA dipstick was ordered and abnormal. DG CHEST 2 view showed no active cardiopulmonary disease.Patient also had a COVID-19 test performed and the showed that he was positive..  It does  not appear he was treated with antivirals at that time.  I told patient he has a second patient I have that has had a heart attack within a month of COVID A/P: coronary artery disease now adequately treated with stent placed . For now continue aspirin, plavix, and xarelto (aspirin to be stopped after a month). See statin discussion below. Has some mild lingering SOB but improved from when he went into hospital- he knows if he has worsening symptoms to see care immediately. No chest pain fortunately. Has nitroglycerin which he can use if has chest pain.   # Diabetes #Morbid Obesity S: Medication:Metformin 1000 mg XR BID Jardiance 10 mg- covered by insurance. As above rx ozempic given last visit- he never started this.  CBGs- 111 this morning- trending down with walking more and adjusting diet Exercise and diet- trying to improve diet. Works 60 hours a week and makes exercise hard- he did get up to 15 mins walking in AM and PM since hospital- no chest pain or shortness of breath with that.  Lab Results  Component Value Date   HGBA1C 9.5 (H) 01/01/2021   HGBA1C 8.3 (H) 07/20/2020   HGBA1C 12.8 (A) 12/09/2019  A/P: Evan Leonard is making improvement on diet and exercise and is down 21 pounds since February-congratulated him on  effort.  Unfortunately A1c did not come down despite this-we are going to try to restart Ozempic.  Titrate up 0.25 mg initially for 4 weeks then 0.5 mg for 4 weeks then 1 mg and follow-up in 3 months and 1 day or 14 weeks - continue metformin and jardiance - may also increase jardiance to 25 mg next visit  #hyperlipidemia S: Medication: Rosuvastatin 40 mg in the past now on atorvastatin 80 mg Lab Results  Component Value Date   CHOL 151 01/02/2021   HDL 22 (L) 01/02/2021   LDLCALC 90 01/02/2021   LDLDIRECT 128.0 07/20/2020   TRIG 194 (H) 01/02/2021   CHOLHDL 6.9 01/02/2021   A/P: We will continue to recheck lipids at next visit and may need to add Zetia to get LDL under 70 if not there with atorvastatin $RemoveBeforeDEI'80mg'IfMmRHfmUSCmDtRJ$   #Chronic pulmonary embolism/chronic DVT S: Patient remains on Eliquis 5 mg twice daily due to history of recurrent DVT  A/P:Stable. Continue current medications.  # last visit Patient reported having fatigue since working the 60 hours on a regular basis - I thought this to be a very high probability that this was related to work and opted to check blood work in last visit. Also though about checking B-12 with long-term metformin use if issues were persistent today- he is having lingering fatigue- could be from MI history- but will aslo check cbc, cmp, tsh, b12.   #GERD- upper abdominal pain and plus feels food in back of throat/acidity for about a month. On PPI since hospital- we discussed if fails to improve to contact us may refer to GI  #HM- see avs discussion  Recommended follow up: Return in about 14 weeks (around 04/17/2021) for follow-up or sooner if needed. Future Appointments  Date Time Provider [place]  01/22/2021  8:30 AM Burnell Blanks, MD [id number]   Lab/Order associations:   ICD-10-CM   1. Coronary artery disease involving native coronary artery of native heart without angina pectoris  I25.10     2. Type 2 diabetes mellitus with  hyperglycemia, without long-term current use of insulin (HCC)  E11.65 Microalbumin / creatinine urine ratio    3. Hyperlipidemia  associated with type 2 diabetes mellitus (HCC)  E11.69 CBC with Differential/Platelet   E78.5 Comprehensive metabolic panel    4. Morbid obesity with body mass index of 40.0-44.9 in adult ([place])  E66.01    Z68.41     5. High risk medication use  Z79.899 Vitamin B12    6. Fatigue, unspecified type  R53.83 TSH      Meds ordered this encounter  Medications   Semaglutide,0.25 or 0.5MG /DOS, (OZEMPIC, 0.25 OR 0.5 MG/DOSE,) 2 MG/1.5ML SOPN    Sig: Inject 0.25 mg as directed once a week for 28 days, THEN 0.5 mg once a week for 14 days.    Dispense:  1.5 mL    Refill:  0    Please provide pen needles   metoprolol succinate (TOPROL-XL) 50 MG 24 hr tablet    Sig: Take 1 tablet (50 mg total) by mouth daily.    Dispense:  90 tablet    Refill:  3   atorvastatin (LIPITOR) 80 MG tablet    Sig: Take 1 tablet (80 mg total) by mouth daily.    Dispense:  90 tablet    Refill:  3   empagliflozin (JARDIANCE) 10 MG TABS tablet    Sig: Take 1 tablet (10 mg total) by mouth daily.    Dispense:  30 tablet    Refill:  11   I,Harris Phan,acting as a scribe for Garret Reddish, MD.,have documented all relevant documentation on the behalf of Garret Reddish, MD,as directed by  Garret Reddish, MD while in the presence of Garret Reddish, MD.  I, Garret Reddish, MD, have reviewed all documentation for this visit. The documentation on 01/09/21 for the exam, diagnosis, procedures, and orders are all accurate and complete.  Time Spent: 40 minutes of total time (1:05 PM- 1:45 PM) was spent on the date of the encounter performing the following actions: chart review prior to seeing the patient, obtaining history, performing a medically necessary exam, counseling on the treatment plan, placing orders, and documenting in our EHR.   Return precautions advised.  Garret Reddish, MD

## 2021-01-18 ENCOUNTER — Telehealth: Payer: Self-pay

## 2021-01-18 NOTE — Telephone Encounter (Signed)
LAST APPOINTMENT DATE:  01/09/21  NEXT APPOINTMENT DATE: 04/26/21  MEDICATION:empagliflozin (JARDIANCE) 10 MG TABS tablet  PHARMACY[id number] [place] [facility] [address]

## 2021-01-19 MED ORDER — EMPAGLIFLOZIN 10 MG PO TABS: 10.0000 mg | ORAL_TABLET | Freq: Every day | ORAL | 11 refills | Status: DC

## 2021-01-19 NOTE — Telephone Encounter (Signed)
Refill sent to pharmacy.   

## 2021-01-22 ENCOUNTER — Encounter: Payer: Self-pay | Admitting: *Deleted

## 2021-01-22 ENCOUNTER — Encounter: Payer: Self-pay | Admitting: Cardiovascular Disease

## 2021-01-22 ENCOUNTER — Other Ambulatory Visit: Payer: Self-pay

## 2021-01-22 ENCOUNTER — Ambulatory Visit (INDEPENDENT_AMBULATORY_CARE_PROVIDER_SITE_OTHER): Payer: No Typology Code available for payment source | Admitting: Cardiovascular Disease

## 2021-01-22 VITALS — BP 118/80 | HR 103 | Ht 74.0 in | Wt 328.6 lb

## 2021-01-22 DIAGNOSIS — I2510 Atherosclerotic heart disease of native coronary artery without angina pectoris: Secondary | ICD-10-CM | POA: Diagnosis not present

## 2021-01-22 DIAGNOSIS — E7800 Pure hypercholesterolemia, unspecified: Secondary | ICD-10-CM

## 2021-01-22 DIAGNOSIS — I10 Essential (primary) hypertension: Secondary | ICD-10-CM | POA: Diagnosis not present

## 2021-01-22 MED ORDER — ASPIRIN 81 MG PO TBEC: 81.0000 mg | DELAYED_RELEASE_TABLET | Freq: Every day | ORAL | 0 refills | Status: AC

## 2021-01-22 NOTE — Progress Notes (Signed)
Chief Complaint  Patient presents with   Follow-up    CAD   History of Present Illness: 60 yo male with history of  CAD, diabetes mellitus, HTN, hyperlipidemia, morbid obesity, history of DVT and PE in 2013, sleep apnea who is here today for cardiac follow up. He was admitted to [facility] 12/31/20 with a NSTEMI. Cardiac cath 01/02/21 with a severe mid RCA stenosis treated with a drug eluting stent. Also with septal branch occlusion. Echo 01/01/21 with LVEF=55-60%. No valve disease. He was discharged on ASA/Plavix and Eliquis with plans to stop ASA in one month.   He is here today for follow up. The patient denies any chest pain, dyspnea, palpitations, lower extremity edema, orthopnea, PND, dizziness, near syncope or syncope.   Primary Care Physician: [name], MD  Past Medical History:  Diagnosis Date   CAD (coronary artery disease)    Diabetes mellitus, type II ([place]) 02/2013   Diabetic ulcer of right foot due to type 2 diabetes mellitus ([place]) 03/21/2018   DVT (deep venous thrombosis) ([place]) 05/04/2012   LLE   Exertional dyspnea 05/04/2012   "isolated episode" (05/05/2012)   Hepatic steatosis    History of chickenpox    Hypertension 03/21/2018   Obesity, Class III, BMI 40-49.9 (morbid obesity) ([place]) 03/21/2018   OSA on CPAP    Peripheral vascular disease (HCC)    varicose veins   Pulmonary embolism ([place]) 05/04/2012   bilaterally   Pyelonephritis 04/2017   Small bowel obstruction ([place]) 09/06/2015   Varicose veins     Past Surgical History:  Procedure Laterality Date   CORONARY STENT INTERVENTION N/A 01/02/2021   Procedure: CORONARY STENT INTERVENTION;  Surgeon: Jettie Booze, MD;  Location: [facility];  Service: Cardiovascular;  Laterality: N/A;   CYSTOSCOPY W/ URETERAL STENT PLACEMENT Right 05/12/2017   Procedure: CYSTOSCOPY WITH RETROGRADE PYELOGRAM/URETERAL STENT PLACEMENT;  Surgeon: [name], MD;  Location: [facility];  Service: Urology;  Laterality: Right;    CYSTOSCOPY/URETEROSCOPY/HOLMIUM LASER/STENT PLACEMENT Right 07/29/2017   Procedure: CYSTOSCOPY RIGHT URETEROSCOPY HOLMIUM LASER STENT EXCHANGE;  Surgeon: [name], MD;  Location: [facility];  Service: Urology;  Laterality: Right;   HERNIA REPAIR     2015   LAPAROTOMY N/A 12/13/2013   Procedure: EXPLORATORY LAPAROTOMY Repair ventral hernia, without mesh, partial omentectomy;  Surgeon: Gwenyth Ober, MD;  Location: [facility];  Service: General;  Laterality: N/A;   LEFT HEART CATH AND CORONARY ANGIOGRAPHY N/A 01/02/2021   Procedure: LEFT HEART CATH AND CORONARY ANGIOGRAPHY;  Surgeon: Jettie Booze, MD;  Location: [facility];  Service: Cardiovascular;  Laterality: N/A;   VENTRAL HERNIA REPAIR N/A 04/26/2017   Procedure: INCARCERATED HERNIA REPAIR VENTRAL ADULT;  Surgeon: Georganna Skeans, MD;  Location: [facility];  Service: General;  Laterality: N/A;    Current Outpatient Medications  Medication Sig Dispense Refill   ACCU-CHEK GUIDE test strip USE AS DIRECTED UP TO 4 TIMES DAILY (Patient taking differently: 1 each by Other route 4 (four) times daily.) 100 strip 3   atorvastatin (LIPITOR) 80 MG tablet Take 1 tablet (80 mg total) by mouth daily. 90 tablet 3   blood glucose meter kit and supplies KIT Dispense based on patient and insurance preference. Use up to four times daily as directed. E11.65 (Patient taking differently: Inject 1 each into the skin See admin instructions. Dispense based on patient and insurance preference. Use up to four times daily as directed. E11.65) 1 each 0   clopidogrel (PLAVIX) 75  MG tablet Take 1 tablet (75 mg total) by mouth daily with breakfast. 30 tablet 1   ELIQUIS 5 MG TABS tablet TAKE 1 TABLET BY MOUTH TWICE A DAY 60 tablet 5   empagliflozin (JARDIANCE) 10 MG TABS tablet Take 1 tablet (10 mg total) by mouth daily. 30 tablet 11   metFORMIN (GLUCOPHAGE XR) 500 MG 24 hr tablet Take 2 tablets (1,000 mg total) by mouth in the morning and at bedtime. Restart on 8/12 180  tablet 3   metoprolol succinate (TOPROL-XL) 50 MG 24 hr tablet Take 1 tablet (50 mg total) by mouth daily. 90 tablet 3   Multiple Vitamin (MULTIVITAMIN WITH MINERALS) TABS tablet Take 1 tablet by mouth daily.      nitroGLYCERIN (NITROSTAT) 0.4 MG SL tablet Place 1 tablet (0.4 mg total) under the tongue every 5 (five) minutes as needed for chest pain. 25 tablet 1   pantoprazole (PROTONIX) 40 MG tablet Take 1 tablet (40 mg total) by mouth daily. 30 tablet 1   promethazine-dextromethorphan (PROMETHAZINE-DM) 6.25-15 MG/5ML syrup Take 5 mLs by mouth at bedtime as needed for cough. 100 mL 0   Semaglutide, 1 MG/DOSE, (OZEMPIC, 1 MG/DOSE,) 2 MG/1.5ML SOPN Inject 1 mg into the skin once a week. 3 mL 5   triamcinolone ointment (KENALOG) 0.5 % Apply 1 application topically 2 (two) times daily. For 7-14 days maximum 60 g 1   aspirin 81 MG EC tablet Take 1 tablet (81 mg total) by mouth daily for 11 days. Swallow whole. 30 tablet 0   No current facility-administered medications for this visit.    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 Lives alone. Friends that live close. No pets.       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       Hobbies: golf, basketball, walking   Tired from work right now   Investment banker, operational of Radio broadcast assistant Strain: Not on file  Food Insecurity: Not  on file  Transportation Needs: Not on file  Physical Activity: Not on file  Stress: Not on file  Social Connections: Not on file  Intimate Partner Violence: Not on file    Family History  Problem Relation Age of Onset   Diabetes Mother        died age 76- also bleeding ulcer   Hypertension Mother    Heart failure Father        in his 38s (patient was 36). day before surgery planned   Other Maternal Grandmother        states natural causes all grandparents    Review of Systems:  As stated in the HPI and otherwise negative.   BP 118/80   Pulse (!) 103   Ht 6' 2" (1.88 m)  Wt (!) 328 lb 9.6 oz (149.1 kg)   SpO2 97%   BMI 42.19 kg/m   Physical Examination: General: Well developed, well nourished, NAD  HEENT: OP clear, mucus membranes moist  SKIN: warm, dry. No rashes. Neuro: No focal deficits  Musculoskeletal: Muscle strength 5/5 all ext  Psychiatric: Mood and affect normal  Neck: No JVD, no carotid bruits, no thyromegaly, no lymphadenopathy.  Lungs:Clear bilaterally, no wheezes, rhonci, crackles Cardiovascular: Regular rate and rhythm. No murmurs, gallops or rubs. Abdomen:Soft. Bowel sounds present. Non-tender.  Extremities: No lower extremity edema. Pulses are 2 + in the bilateral DP/PT.  EKG:  EKG is not ordered today. The ekg ordered today demonstrates   Echo 01/01/21:  1. Left ventricular ejection fraction, by estimation, is 55 to 60%. The  left ventricle has normal function. The left ventricle has no regional  wall motion abnormalities. Left ventricular diastolic parameters are  consistent with Grade I diastolic  dysfunction (impaired relaxation).   2. Right ventricular systolic function is normal. The right ventricular  size is normal.   3. The mitral valve is normal in structure. No evidence of mitral valve  regurgitation. No evidence of mitral stenosis.   4. The aortic valve is tricuspid. Aortic valve regurgitation is not  visualized. No aortic stenosis is  present.   5. The inferior vena cava is normal in size with greater than 50%  respiratory variability, suggesting right atrial pressure of 3 mmHg.   Cardiac cath 01/02/21:   2nd Sept lesion is 100% stenosed.  Right to left collaterals.   Mid RCA lesion is 95% stenosed.  A drug-eluting stent was successfully placed using a STENT ONYX FRONTIER 4.0X26, postdilated to 4.5 mm.   A drug-eluting stent was successfully placed using a STENT ONYX FRONTIER 4.0X26.   Post intervention, there is a 0% residual stenosis.   The left ventricular systolic function is normal.   LV end diastolic pressure is normal.   The left ventricular ejection fraction is 55-65% by visual estimate.   There is no aortic valve stenosis.  Recent Labs: 01/01/2021: B Natriuretic Peptide 634.1 01/09/2021: ALT 30; BUN 17; Creatinine, Ser 1.25; Hemoglobin 15.4; Platelets 237.0; Potassium 4.3; Sodium 138; TSH 2.71   Lipid Panel    Component Value Date/Time   CHOL 151 01/02/2021 0411   TRIG 194 (H) 01/02/2021 0411   HDL 22 (L) 01/02/2021 0411   CHOLHDL 6.9 01/02/2021 0411   VLDL 39 01/02/2021 0411   LDLCALC 90 01/02/2021 0411   LDLCALC 131 (H) 12/27/2019 1457   LDLDIRECT 128.0 07/20/2020 1635     Wt Readings from Last 3 Encounters:  01/22/21 (!) 328 lb 9.6 oz (149.1 kg)  01/09/21 (!) 327 lb 6.4 oz (148.5 kg)  01/02/21 (!) 339 lb 11.2 oz (154.1 kg)     Assessment and Plan:   1. CAD without angina: Recent NSTEMI secondary to severe mid RCA stenosis. This was treated with a drug eluting stent. He is doing well. LV function normal by echo. He has no angina. Will continue DAPT with ASA and Plavix until September 9th and then will stop ASA and continue Plavix alone with his Eliquis.   2. HTN: BP is controlled. No changes  3. Hyperlipidemia: LDL 90 while hospitalized. Continue statin. Repeat lipids in 2 months  Current medicines are reviewed at length with the patient today.  The patient does not have concerns regarding  medicines.  The following changes have been made:  no change  Labs/ tests ordered today   include:   Orders Placed This Encounter  Procedures   Hepatic function panel   Lipid panel    Disposition:   F/U with me in 6 months.    Signed,  , MD 01/22/2021 9:16 AM    [facility] [address], [place], [place]  [address] Phone: [phone]; Fax: [phone]    

## 2021-01-22 NOTE — Patient Instructions (Signed)
Medication Instructions:  1.) stop aspirin in 10 days (after 02/01/21). Continue all other medications.  *If you need a refill on your cardiac medications before your next appointment, please call your pharmacy*   Lab Work: Please return in 2 months for labs --lipids/liver function  If you have labs (blood work) drawn today and your tests are completely normal, you will receive your results only by: [place] (if you have MyChart) OR A paper copy in the mail If you have any lab test that is abnormal or we need to change your treatment, we will call you to review the results.   Testing/Procedures: none   Follow-Up: At [facility], you and your health needs are our priority.  As part of our continuing mission to provide you with exceptional heart care, we have created designated Provider Care Teams.  These Care Teams include your primary Cardiologist (physician) and Advanced Practice Providers (APPs -  Physician Assistants and Nurse Practitioners) who all work together to provide you with the care you need, when you need it.   Your next appointment:   6 month(s)  The format for your next appointment:   In Person  Provider:   You may see Lauree Chandler, MD or one of the following Advanced Practice Providers on your designated Care Team:   Melina Copa, PA-C Ermalinda Barrios, PA-C   Other Instructions

## 2021-01-23 ENCOUNTER — Ambulatory Visit: Payer: No Typology Code available for payment source | Admitting: Family Medicine

## 2021-02-02 ENCOUNTER — Telehealth: Payer: Self-pay

## 2021-02-02 MED ORDER — CLOPIDOGREL BISULFATE 75 MG PO TABS: 75.0000 mg | ORAL_TABLET | Freq: Every day | ORAL | 5 refills | Status: DC

## 2021-02-02 MED ORDER — PANTOPRAZOLE SODIUM 40 MG PO TBEC: 40.0000 mg | DELAYED_RELEASE_TABLET | Freq: Every day | ORAL | 5 refills | Status: DC

## 2021-02-02 MED ORDER — ATORVASTATIN CALCIUM 80 MG PO TABS
80.0000 mg | ORAL_TABLET | Freq: Every day | ORAL | 5 refills | Status: DC
Start: 2021-02-02 — End: 2021-07-16

## 2021-02-02 MED ORDER — OZEMPIC (1 MG/DOSE) 2 MG/1.5ML ~~LOC~~ SOPN: 1.0000 mg | PEN_INJECTOR | SUBCUTANEOUS | 5 refills | Status: DC

## 2021-02-02 NOTE — Telephone Encounter (Signed)
Refills sent to pharmacy. 

## 2021-02-02 NOTE — Telephone Encounter (Signed)
.   Encourage patient to contact the pharmacy for refills or they can request refills through [place]:  Please schedule appointment if longer than 1 year  NEXT APPOINTMENT DATE:  MEDICATION:atorvastatin (LIPITOR) 80 MG tablet  clopidogrel (PLAVIX) 75 MG tablet  pantoprazole (PROTONIX) 40 MG tablet  Semaglutide, 1 MG/DOSE, (OZEMPIC, 1 MG/DOSE,) 2 MG/1.5ML SOPN  Is the patient out of medication?   [facility]ARMACY:[id number] [place]  Let patient know to contact pharmacy at the end of the day to make sure medication is ready.  Please notify patient to allow 48-72 hours to process  CLINICAL FILLS OUT ALL BELOW:   LAST REFILL:  QTY:  REFILL DATE:    OTHER COMMENTS:    Okay for refill?  Please advise

## 2021-02-05 ENCOUNTER — Other Ambulatory Visit: Payer: Self-pay | Admitting: Family Medicine

## 2021-02-19 ENCOUNTER — Inpatient Hospital Stay (HOSPITAL_COMMUNITY)
Admission: EM | Admit: 2021-02-19 | Discharge: 2021-02-26 | DRG: 330 | Disposition: A | Discharge status: Home / Self Care | Payer: PRIVATE HEALTH INSURANCE | Attending: Family Medicine | Admitting: Family Medicine

## 2021-02-19 ENCOUNTER — Inpatient Hospital Stay (HOSPITAL_COMMUNITY): Payer: PRIVATE HEALTH INSURANCE

## 2021-02-19 ENCOUNTER — Other Ambulatory Visit: Payer: Self-pay

## 2021-02-19 ENCOUNTER — Emergency Department (HOSPITAL_COMMUNITY): Payer: PRIVATE HEALTH INSURANCE

## 2021-02-19 DIAGNOSIS — Z7902 Long term (current) use of antithrombotics/antiplatelets: Secondary | ICD-10-CM | POA: Diagnosis not present

## 2021-02-19 DIAGNOSIS — Z8619 Personal history of other infectious and parasitic diseases: Secondary | ICD-10-CM | POA: Diagnosis not present

## 2021-02-19 DIAGNOSIS — Z9889 Other specified postprocedural states: Secondary | ICD-10-CM | POA: Diagnosis not present

## 2021-02-19 DIAGNOSIS — G4733 Obstructive sleep apnea (adult) (pediatric): Secondary | ICD-10-CM | POA: Diagnosis present

## 2021-02-19 DIAGNOSIS — Z0189 Encounter for other specified special examinations: Secondary | ICD-10-CM

## 2021-02-19 DIAGNOSIS — K430 Incisional hernia with obstruction, without gangrene: Principal | ICD-10-CM | POA: Diagnosis present

## 2021-02-19 DIAGNOSIS — K219 Gastro-esophageal reflux disease without esophagitis: Secondary | ICD-10-CM | POA: Diagnosis present

## 2021-02-19 DIAGNOSIS — Z86711 Personal history of pulmonary embolism: Secondary | ICD-10-CM

## 2021-02-19 DIAGNOSIS — Z7901 Long term (current) use of anticoagulants: Secondary | ICD-10-CM

## 2021-02-19 DIAGNOSIS — E1165 Type 2 diabetes mellitus with hyperglycemia: Secondary | ICD-10-CM | POA: Diagnosis present

## 2021-02-19 DIAGNOSIS — Z7984 Long term (current) use of oral hypoglycemic drugs: Secondary | ICD-10-CM

## 2021-02-19 DIAGNOSIS — K436 Other and unspecified ventral hernia with obstruction, without gangrene: Secondary | ICD-10-CM | POA: Diagnosis not present

## 2021-02-19 DIAGNOSIS — Z8249 Family history of ischemic heart disease and other diseases of the circulatory system: Secondary | ICD-10-CM

## 2021-02-19 DIAGNOSIS — Z833 Family history of diabetes mellitus: Secondary | ICD-10-CM

## 2021-02-19 DIAGNOSIS — Z86718 Personal history of other venous thrombosis and embolism: Secondary | ICD-10-CM

## 2021-02-19 DIAGNOSIS — I5032 Chronic diastolic (congestive) heart failure: Secondary | ICD-10-CM | POA: Diagnosis present

## 2021-02-19 DIAGNOSIS — L8990 Pressure ulcer of unspecified site, unspecified stage: Secondary | ICD-10-CM | POA: Insufficient documentation

## 2021-02-19 DIAGNOSIS — D72829 Elevated white blood cell count, unspecified: Secondary | ICD-10-CM | POA: Diagnosis not present

## 2021-02-19 DIAGNOSIS — E1151 Type 2 diabetes mellitus with diabetic peripheral angiopathy without gangrene: Secondary | ICD-10-CM | POA: Diagnosis present

## 2021-02-19 DIAGNOSIS — I2510 Atherosclerotic heart disease of native coronary artery without angina pectoris: Secondary | ICD-10-CM | POA: Diagnosis present

## 2021-02-19 DIAGNOSIS — Z8719 Personal history of other diseases of the digestive system: Secondary | ICD-10-CM | POA: Diagnosis present

## 2021-02-19 DIAGNOSIS — E785 Hyperlipidemia, unspecified: Secondary | ICD-10-CM | POA: Diagnosis present

## 2021-02-19 DIAGNOSIS — I110 Hypertensive heart disease with heart failure: Secondary | ICD-10-CM | POA: Diagnosis present

## 2021-02-19 DIAGNOSIS — K56609 Unspecified intestinal obstruction, unspecified as to partial versus complete obstruction: Secondary | ICD-10-CM

## 2021-02-19 DIAGNOSIS — Z955 Presence of coronary angioplasty implant and graft: Secondary | ICD-10-CM

## 2021-02-19 DIAGNOSIS — L89812 Pressure ulcer of head, stage 2: Secondary | ICD-10-CM | POA: Diagnosis not present

## 2021-02-19 DIAGNOSIS — Z79899 Other long term (current) drug therapy: Secondary | ICD-10-CM

## 2021-02-19 DIAGNOSIS — Z6841 Body Mass Index (BMI) 40.0 and over, adult: Secondary | ICD-10-CM

## 2021-02-19 DIAGNOSIS — Z4659 Encounter for fitting and adjustment of other gastrointestinal appliance and device: Secondary | ICD-10-CM

## 2021-02-19 DIAGNOSIS — Z20822 Contact with and (suspected) exposure to covid-19: Secondary | ICD-10-CM | POA: Diagnosis present

## 2021-02-19 DIAGNOSIS — I252 Old myocardial infarction: Secondary | ICD-10-CM

## 2021-02-19 DIAGNOSIS — Z789 Other specified health status: Secondary | ICD-10-CM

## 2021-02-19 LAB — CBC WITH DIFFERENTIAL/PLATELET
Abs Immature Granulocytes: 0.03 10*3/uL (ref 0.00–0.07)
Basophils Absolute: 0.1 10*3/uL (ref 0.0–0.1)
Basophils Relative: 0 %
Eosinophils Absolute: 0.3 10*3/uL (ref 0.0–0.5)
Eosinophils Relative: 2 %
HCT: 47.6 % (ref 39.0–52.0)
Hemoglobin: 15.2 g/dL (ref 13.0–17.0)
Immature Granulocytes: 0 %
Lymphocytes Relative: 7 %
Lymphs Abs: 1 10*3/uL (ref 0.7–4.0)
MCH: 29.3 pg (ref 26.0–34.0)
MCHC: 31.9 g/dL (ref 30.0–36.0)
MCV: 91.7 fL (ref 80.0–100.0)
Monocytes Absolute: 1.2 10*3/uL — ABNORMAL HIGH (ref 0.1–1.0)
Monocytes Relative: 9 %
Neutro Abs: 11.1 10*3/uL — ABNORMAL HIGH (ref 1.7–7.7)
Neutrophils Relative %: 82 %
Platelets: 259 10*3/uL (ref 150–400)
RBC: 5.19 MIL/uL (ref 4.22–5.81)
RDW: 14.5 % (ref 11.5–15.5)
WBC: 13.6 10*3/uL — ABNORMAL HIGH (ref 4.0–10.5)
nRBC: 0 % (ref 0.0–0.2)

## 2021-02-19 LAB — APTT
aPTT: 23 seconds — ABNORMAL LOW (ref 24–36)
aPTT: 43 seconds — ABNORMAL HIGH (ref 24–36)

## 2021-02-19 LAB — COMPREHENSIVE METABOLIC PANEL
ALT: 20 U/L (ref 0–44)
AST: 17 U/L (ref 15–41)
Albumin: 3.2 g/dL — ABNORMAL LOW (ref 3.5–5.0)
Alkaline Phosphatase: 76 U/L (ref 38–126)
Anion gap: 11 (ref 5–15)
BUN: 17 mg/dL (ref 8–23)
CO2: 21 mmol/L — ABNORMAL LOW (ref 22–32)
Calcium: 9.1 mg/dL (ref 8.9–10.3)
Chloride: 104 mmol/L (ref 98–111)
Creatinine, Ser: 1.02 mg/dL (ref 0.61–1.24)
GFR, Estimated: >60 mL/min (ref >60)
Glucose, Bld: 173 mg/dL — ABNORMAL HIGH (ref 70–99)
Potassium: 3.9 mmol/L (ref 3.5–5.1)
Sodium: 136 mmol/L (ref 135–145)
Total Bilirubin: 1 mg/dL (ref 0.3–1.2)
Total Protein: 7.3 g/dL (ref 6.5–8.1)

## 2021-02-19 LAB — URINALYSIS, ROUTINE W REFLEX MICROSCOPIC
Bacteria, UA: NONE SEEN
Bilirubin Urine: NEGATIVE
Glucose, UA: >=500 mg/dL — AB
Hgb urine dipstick: NEGATIVE
Ketones, ur: 20 mg/dL — AB
Leukocytes,Ua: NEGATIVE
Nitrite: NEGATIVE
Protein, ur: NEGATIVE mg/dL
Specific Gravity, Urine: >1.046 — ABNORMAL HIGH (ref 1.005–1.030)
pH: 5 (ref 5.0–8.0)

## 2021-02-19 LAB — LIPASE, BLOOD: Lipase: 23 U/L (ref 11–51)

## 2021-02-19 LAB — TYPE AND SCREEN
ABO/RH(D): A POS
Antibody Screen: NEGATIVE

## 2021-02-19 LAB — RESP PANEL BY RT-PCR (FLU A&B, COVID) ARPGX2
Influenza A by PCR: NEGATIVE
Influenza B by PCR: NEGATIVE
SARS Coronavirus 2 by RT PCR: NEGATIVE

## 2021-02-19 LAB — HEPARIN LEVEL (UNFRACTIONATED): Heparin Unfractionated: 0.44 IU/mL (ref 0.30–0.70)

## 2021-02-19 LAB — PROTIME-INR
INR: 1.1 (ref 0.8–1.2)
Prothrombin Time: 14.1 seconds (ref 11.4–15.2)

## 2021-02-19 LAB — ABO/RH: ABO/RH(D): A POS

## 2021-02-19 MED ORDER — SODIUM CHLORIDE 0.9% FLUSH
3.0000 mL | Freq: Two times a day (BID) | INTRAVENOUS | Status: DC
  Administered 2021-02-19 – 2021-02-26 (×10): 3 mL via INTRAVENOUS

## 2021-02-19 MED ORDER — ONDANSETRON HCL 4 MG/2ML IJ SOLN
4.0000 mg | Freq: Once | INTRAMUSCULAR | Status: AC
  Administered 2021-02-19: 4 mg via INTRAVENOUS
  Filled 2021-02-19: qty 2

## 2021-02-19 MED ORDER — ACETAMINOPHEN 325 MG PO TABS
650.0000 mg | ORAL_TABLET | Freq: Four times a day (QID) | ORAL | Status: DC | PRN
Start: 2021-02-19 — End: 2021-02-23
  Filled 2021-02-19: qty 2

## 2021-02-19 MED ORDER — LABETALOL HCL 5 MG/ML IV SOLN: 10.0000 mg | INTRAVENOUS | Status: DC | PRN

## 2021-02-19 MED ORDER — PANTOPRAZOLE SODIUM 40 MG IV SOLR
40.0000 mg | INTRAVENOUS | Status: DC
  Administered 2021-02-19 – 2021-02-23 (×5): 40 mg via INTRAVENOUS
  Filled 2021-02-19 (×5): qty 40

## 2021-02-19 MED ORDER — DIATRIZOATE MEGLUMINE & SODIUM 66-10 % PO SOLN
90.0000 mL | Freq: Once | ORAL | Status: DC
  Filled 2021-02-19: qty 90

## 2021-02-19 MED ORDER — HYDROMORPHONE HCL 1 MG/ML IJ SOLN
0.5000 mg | INTRAMUSCULAR | Status: DC | PRN
  Administered 2021-02-20: .5 mg via INTRAVENOUS
  Administered 2021-02-20 – 2021-02-21 (×4): 0.5 mg via INTRAVENOUS
  Filled 2021-02-19 (×5): qty 1

## 2021-02-19 MED ORDER — ACETAMINOPHEN 650 MG RE SUPP: 650.0000 mg | Freq: Four times a day (QID) | RECTAL | Status: DC | PRN

## 2021-02-19 MED ORDER — FENTANYL CITRATE PF 50 MCG/ML IJ SOSY: 25.0000 ug | PREFILLED_SYRINGE | INTRAMUSCULAR | Status: DC | PRN

## 2021-02-19 MED ORDER — IOHEXOL 350 MG/ML SOLN
100.0000 mL | Freq: Once | INTRAVENOUS | Status: AC | PRN
  Administered 2021-02-19: 100 mL via INTRAVENOUS

## 2021-02-19 MED ORDER — SODIUM CHLORIDE 0.9 % IV SOLN: INTRAVENOUS | Status: DC

## 2021-02-19 MED ORDER — FENTANYL CITRATE PF 50 MCG/ML IJ SOSY
50.0000 ug | PREFILLED_SYRINGE | INTRAMUSCULAR | Status: DC | PRN
  Administered 2021-02-19: 50 ug via INTRAVENOUS
  Filled 2021-02-19: qty 1

## 2021-02-19 MED ORDER — HEPARIN (PORCINE) 25000 UT/250ML-% IV SOLN
2300.0000 [IU]/h | INTRAVENOUS | Status: DC
  Administered 2021-02-19: 1850 [IU]/h via INTRAVENOUS
  Filled 2021-02-19 (×2): qty 250

## 2021-02-19 MED ORDER — ONDANSETRON HCL 4 MG PO TABS
4.0000 mg | ORAL_TABLET | Freq: Four times a day (QID) | ORAL | Status: DC | PRN
  Administered 2021-02-24: 4 mg via ORAL
  Filled 2021-02-19: qty 1

## 2021-02-19 MED ORDER — FENTANYL CITRATE PF 50 MCG/ML IJ SOSY
25.0000 ug | PREFILLED_SYRINGE | INTRAMUSCULAR | Status: DC | PRN
  Administered 2021-02-19 (×2): 25 ug via INTRAVENOUS
  Filled 2021-02-19 (×2): qty 1

## 2021-02-19 MED ORDER — HYDROMORPHONE HCL 1 MG/ML IJ SOLN
1.0000 mg | INTRAMUSCULAR | Status: DC | PRN
Start: 2021-02-19 — End: 2021-02-21
  Administered 2021-02-19 – 2021-02-20 (×3): 1 mg via INTRAVENOUS
  Filled 2021-02-19 (×3): qty 1

## 2021-02-19 MED ORDER — FENTANYL CITRATE PF 50 MCG/ML IJ SOSY
50.0000 ug | PREFILLED_SYRINGE | Freq: Once | INTRAMUSCULAR | Status: AC
Start: 2021-02-19 — End: 2021-02-19
  Administered 2021-02-19: 50 ug via INTRAVENOUS
  Filled 2021-02-19: qty 1

## 2021-02-19 MED ORDER — DIATRIZOATE MEGLUMINE & SODIUM 66-10 % PO SOLN
90.0000 mL | Freq: Once | ORAL | Status: AC
  Administered 2021-02-19: 90 mL via NASOGASTRIC
  Filled 2021-02-19: qty 90

## 2021-02-19 MED ORDER — ALBUTEROL SULFATE (2.5 MG/3ML) 0.083% IN NEBU: 2.5000 mg | INHALATION_SOLUTION | Freq: Four times a day (QID) | RESPIRATORY_TRACT | Status: DC | PRN

## 2021-02-19 MED ORDER — ONDANSETRON HCL 4 MG/2ML IJ SOLN
4.0000 mg | Freq: Four times a day (QID) | INTRAMUSCULAR | Status: DC | PRN
  Administered 2021-02-19 – 2021-02-24 (×7): 4 mg via INTRAVENOUS
  Filled 2021-02-19 (×7): qty 2

## 2021-02-19 NOTE — Progress Notes (Addendum)
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  No Known Allergies  Patient Measurements: Height: 6\' 2"  (188 cm) Weight: (!) 149.1 kg (328 lb 11.3 oz) IBW/kg (Calculated) : 82.2 Heparin Dosing Weight: 116 kg  Vital Signs: BP: 122/77 (09/26 2015) Pulse Rate: 91 (09/26 2015)  Labs: Recent Labs    02/19/21 0733 02/19/21 1002 02/19/21 1503 02/19/21 1928  HGB 15.2  --   --   --   HCT 47.6  --   --   --   PLT 259  --   --   --   APTT  --   --  23* 43*  LABPROT  --  14.1  --   --   INR  --  1.1  --   --   HEPARINUNFRC  --   --  0.44  --   CREATININE 1.02  --   --   --      Estimated Creatinine Clearance: 117.3 mL/min (by C-G formula based on SCr of 1.02 mg/dL).   Medical History: Past Medical History:  Diagnosis Date   CAD (coronary artery disease)    Diabetes mellitus, type II ([place]) 02/2013   Diabetic ulcer of right foot due to type 2 diabetes mellitus ([place]) 03/21/2018   DVT (deep venous thrombosis) ([place]) 05/04/2012   LLE   Exertional dyspnea 05/04/2012   "isolated episode" (05/05/2012)   Hepatic steatosis    History of chickenpox    Hypertension 03/21/2018   Obesity, Class III, BMI 40-49.9 (morbid obesity) ([place]) 03/21/2018   OSA on CPAP    Peripheral vascular disease (HCC)    varicose veins   Pulmonary embolism ([place]) 05/04/2012   bilaterally   Pyelonephritis 04/2017   Small bowel obstruction ([place]) 09/06/2015   Varicose veins     Medications: see MAR  Assessment: 60 yo M presenting with SBO with incarcerated ventral hernia. Pt is NPO and heparin consult to continue therapeutic AC while holding PTA Eliquis. Last dose of Eliquis 9/24 PM per med rec.   Baseline heparin level falsely elevated (0.44), aPTT normal (23). Initial heparin level came back subtherapeutic at 43, on 1850 units/hr (drawn ~5 hr after infusion start). No s/sx of bleeding or infusion issues.   Goal of Therapy:   Heparin level 0.3-0.7 units/ml Heparin level 66-102 units/ml Monitor platelets by anticoagulation protocol: Yes   Plan:  Increase heparin infusion at 2150 units/hr Check aPTT in 6hr Monitor daily aPTT, HL and CBC  Antonietta Jewel, PharmD, [place] Clinical Pharmacist  Phone: [phone] 02/19/2021 8:56 PM  Please check AMION for all [place] phone numbers After 10:00 PM, call [place] [phone]

## 2021-02-19 NOTE — ED Notes (Signed)
Patient is getting into a gown

## 2021-02-19 NOTE — ED Triage Notes (Signed)
Pt with generalized abdominal pain since yesterday. Has associated nausea without vomiting. Last BM Friday and is taking stool softeners/laxatives without improvement. Hx hernia surgery.

## 2021-02-19 NOTE — ED Provider Notes (Signed)
Emergency Medicine Provider Triage Evaluation Note  Evan Leonard , a 60 y.o. male  was evaluated in triage.  Pt complains of gradual onset, constant, diffuse, achy, abdominal pain for the past few days. Has not had a BM since this past Friday despite taking OTC medications for same. + nausea, no vomiting. Previous hernia surgery a few years ago. Still passing gas.  Review of Systems  Positive: + abd pain, nausea, constipation Negative: - obstipation  Physical Exam  There were no vitals taken for this visit. Gen:   Awake, no distress   Resp:  Normal effort  MSK:   Moves extremities without difficulty  Other:  Diffuse abdominal TTP without rebound or guarding  Medical Decision Making  Medically screening exam initiated at 7:33 AM.  Appropriate orders placed.  Evan Leonard was informed that the remainder of the evaluation will be completed by another provider, this initial triage assessment does not replace that evaluation, and the importance of remaining in the ED until their evaluation is complete.     Eustaquio Maize, PA-C 02/19/21 [place], [place], DO 02/19/21 [id number]

## 2021-02-19 NOTE — H&P (Signed)
History and Physical    Evan Leonard LJQ:[id number] DOB: [birth year] DOA: 02/19/2021  Referring MD/NP/PA: Madalyn Rob, MD PCP: [name], MD  Patient coming from: Home  Chief Complaint: Abdominal pain and nausea I have personally briefly reviewed patient's old medical records in [place]   HPI: Evan Leonard is a 60 y.o. male with medical history significant of hypertension, CAD, history of DVT/PE on Eliquis, DM type II, and morbid obesity presents with complaints of a 2-day history of abdominal pain and nausea.  When symptoms first started he just did not feel well and was nauseated.  Then yesterday patient reported having tightness and bloated feeling around his navel that progressively worsening.  His last bowel movement was 2 days ago, but he still reports being able to pass some flatus.  He had tried taking some over-the-counter stool softeners without any improvement in symptoms.  Associated symptoms of dry heaves and chronically has some right lower leg swelling that is unchanged.  He is on Eliquis for history of DVT/PE back in 2013 and last took Eliquis 2 days ago.  Patient had just been hospitalized last month with NSTEMI requiring stents.  He has been taking all of his medications as prescribed until just here recently due to symptoms.  Denies having any fever, chest pain, shortness of breath, or dysuria.  Patient previously had ventral hernia repair back in 2015, and subsequently had additional repair in 2018.  Abdominal pain worsened today which prompted him to the emergency department for further evaluation.  ED Course: Upon admission into the emergency department patient was seen to be afebrile with vital signs relatively within normal limits.  Labs significant for WBC 13.6, and all other labs relatively within normal limits.  Influenza and COVID-19 screening were negative.  CT scan of the abdomen and pelvis with contrast significant for large and complex  multilocular ventral hernia with incarcerated small bowel and high-grade small bowel obstruction.  General surgery had been consulted and recommended placement of NG tube and to consult medicine for admission.  Patient had been given fentanyl and Zofran.  Review of Systems  Constitutional:  Positive for malaise/fatigue. Negative for fever.  HENT:  Negative for congestion and nosebleeds.   Eyes:  Negative for photophobia and pain.  Respiratory:  Negative for cough and shortness of breath.   Cardiovascular:  Negative for chest pain and leg swelling.  Gastrointestinal:  Positive for abdominal pain and nausea. Negative for vomiting.  Genitourinary:  Negative for dysuria and hematuria.  Musculoskeletal:  Negative for back pain and neck pain.  Skin:  Negative for itching and rash.  Neurological:  Negative for focal weakness and loss of consciousness.  Psychiatric/Behavioral:  Negative for hallucinations and substance abuse.    Past Medical History:  Diagnosis Date   CAD (coronary artery disease)    Diabetes mellitus, type II ([place]) 02/2013   Diabetic ulcer of right foot due to type 2 diabetes mellitus ([place]) 03/21/2018   DVT (deep venous thrombosis) ([place]) 05/04/2012   LLE   Exertional dyspnea 05/04/2012   "isolated episode" (05/05/2012)   Hepatic steatosis    History of chickenpox    Hypertension 03/21/2018   Obesity, Class III, BMI 40-49.9 (morbid obesity) ([place]) 03/21/2018   OSA on CPAP    Peripheral vascular disease (HCC)    varicose veins   Pulmonary embolism ([place]) 05/04/2012   bilaterally   Pyelonephritis 04/2017   Small bowel obstruction ([place]) 09/06/2015   Varicose veins  Past Surgical History:  Procedure Laterality Date   CORONARY STENT INTERVENTION N/A 01/02/2021   Procedure: CORONARY STENT INTERVENTION;  Surgeon: Jettie Booze, MD;  Location: [facility];  Service: Cardiovascular;  Laterality: N/A;   CYSTOSCOPY W/ URETERAL STENT PLACEMENT Right 05/12/2017    Procedure: CYSTOSCOPY WITH RETROGRADE PYELOGRAM/URETERAL STENT PLACEMENT;  Surgeon: [name], MD;  Location: [facility];  Service: Urology;  Laterality: Right;   CYSTOSCOPY/URETEROSCOPY/HOLMIUM LASER/STENT PLACEMENT Right 07/29/2017   Procedure: CYSTOSCOPY RIGHT URETEROSCOPY HOLMIUM LASER STENT EXCHANGE;  Surgeon: [name], MD;  Location: [facility];  Service: Urology;  Laterality: Right;   HERNIA REPAIR     2015   LAPAROTOMY N/A 12/13/2013   Procedure: EXPLORATORY LAPAROTOMY Repair ventral hernia, without mesh, partial omentectomy;  Surgeon: Gwenyth Ober, MD;  Location: [facility];  Service: General;  Laterality: N/A;   LEFT HEART CATH AND CORONARY ANGIOGRAPHY N/A 01/02/2021   Procedure: LEFT HEART CATH AND CORONARY ANGIOGRAPHY;  Surgeon: Jettie Booze, MD;  Location: [facility];  Service: Cardiovascular;  Laterality: N/A;   VENTRAL HERNIA REPAIR N/A 04/26/2017   Procedure: INCARCERATED HERNIA REPAIR VENTRAL ADULT;  Surgeon: Georganna Skeans, MD;  Location: [facility];  Service: General;  Laterality: N/A;     reports that he has never smoked. He has never used smokeless tobacco. He reports that he does not drink alcohol and does not use drugs.  No Known Allergies  Family History  Problem Relation Age of Onset   Diabetes Mother        died age 9- also bleeding ulcer   Hypertension Mother    Heart failure Father        in his 10s (patient was 59). day before surgery planned   Other Maternal Grandmother        states natural causes all grandparents    Prior to Admission medications   Medication Sig Start Date End Date Taking? Authorizing Provider  acetaminophen (TYLENOL) 500 MG tablet Take 500-1,000 mg by mouth every 6 (six) hours as needed for mild pain or headache.   Yes [provider]  atorvastatin (LIPITOR) 80 MG tablet Take 1 tablet (80 mg total) by mouth daily. 02/02/21  Yes [name], MD  clopidogrel (PLAVIX) 75 MG tablet Take 1 tablet (75 mg total) by mouth daily  with breakfast. 02/02/21  Yes [name], MD  ELIQUIS 5 MG TABS tablet TAKE 1 TABLET BY MOUTH TWICE A DAY Patient taking differently: Take 5 mg by mouth in the morning and at bedtime. 10/30/20  Yes [name], MD  empagliflozin (JARDIANCE) 10 MG TABS tablet Take 1 tablet (10 mg total) by mouth daily. 01/19/21  Yes [name], MD  metFORMIN (GLUCOPHAGE XR) 500 MG 24 hr tablet Take 2 tablets (1,000 mg total) by mouth in the morning and at bedtime. Restart on 8/12 Patient taking differently: Take 1,000 mg by mouth in the morning and at bedtime. 01/05/21  Yes Elgergawy, Silver Huguenin, MD  metoprolol succinate (TOPROL-XL) 50 MG 24 hr tablet Take 1 tablet (50 mg total) by mouth daily. 01/09/21  Yes [name], MD  Multiple Vitamins-Minerals (CENTRUM SILVER 50+MEN) TABS Take 1 tablet by mouth daily with breakfast.   Yes [provider]  nitroGLYCERIN (NITROSTAT) 0.4 MG SL tablet Place 1 tablet (0.4 mg total) under the tongue every 5 (five) minutes as needed for chest pain. 01/03/21  Yes Elgergawy, Silver Huguenin, MD  pantoprazole (PROTONIX) 40 MG tablet Take 1  tablet (40 mg total) by mouth daily. Patient taking differently: Take 40 mg by mouth daily before breakfast. 02/02/21  Yes [name], MD  PRESCRIPTION MEDICATION Inhale 1 Device into the lungs See admin instructions. CPAP- At bedtime every night   Yes [provider]  Semaglutide, 1 MG/DOSE, (OZEMPIC, 1 MG/DOSE,) 2 MG/1.5ML SOPN Inject 1 mg into the skin once a week. 02/02/21  Yes [name], MD  ACCU-CHEK GUIDE test strip USE AS DIRECTED UP TO 4 TIMES DAILY Patient taking differently: 1 each by Other route 4 (four) times daily. 12/30/19   [name], MD  blood glucose meter kit and supplies KIT Dispense based on patient and insurance preference. Use up to four times daily as directed. E11.65 Patient taking differently: Inject 1 each into the skin See admin instructions. Dispense based on patient and  insurance preference. Use up to four times daily as directed. E11.65 12/09/19   [name], MD  promethazine-dextromethorphan (PROMETHAZINE-DM) 6.25-15 MG/5ML syrup Take 5 mLs by mouth at bedtime as needed for cough. Patient not taking: Reported on 02/19/2021 12/20/20   Jaynee Eagles, PA-C  triamcinolone ointment (KENALOG) 0.5 % Apply 1 application topically 2 (two) times daily. For 7-14 days maximum Patient not taking: Reported on 02/19/2021 12/27/19   [name], MD    Physical Exam:  Constitutional: Morbidly obese male currently in no acute distress Vitals:   02/19/21 0735 02/19/21 1115  BP: 128/64 (!) 148/83  Pulse: 85 81  Resp: 16 16  Temp: 98.4 F (36.9 C)   TempSrc: Oral   SpO2: 96% 96%   Eyes: PERRL, lids and conjunctivae normal ENMT: Mucous membranes are dry. Posterior pharynx clear of any exudate or lesions.  Neck: normal, supple, no masses, no thyromegaly Respiratory: clear to auscultation bilaterally, no wheezing, no crackles. Normal respiratory effort. No accessory muscle use.  Cardiovascular: Regular rate and rhythm, no murmurs / rubs / gallops.  Trace right lower extremity extremity edema. 2+ pedal pulses. No carotid bruits.  Abdomen: Protuberant abdomen with tenderness to palpation in the periumbilical region.  Bowel sounds are decreased.  Healed surgical scars appreciated from prior abdominal surgeries. Musculoskeletal: no clubbing / cyanosis. No joint deformity upper and lower extremities. Good ROM, no contractures. Normal muscle tone.  Skin: no rashes, lesions, ulcers. No induration Neurologic: CN 2-12 grossly intact. Sensation intact, DTR normal. Strength 5/5 in all 4.  Psychiatric: Normal judgment and insight. Alert and oriented x 3. Normal mood.     Labs on Admission: I have personally reviewed following labs and imaging studies  CBC: Recent Labs  Lab 02/19/21 0733  WBC 13.6*  NEUTROABS 11.1*  HGB 15.2  HCT 47.6  MCV 91.7  PLT 005   Basic  Metabolic Panel: Recent Labs  Lab 02/19/21 0733  NA 136  K 3.9  CL 104  CO2 21*  GLUCOSE 173*  BUN 17  CREATININE 1.02  CALCIUM 9.1   GFR: CrCl cannot be calculated (Unknown ideal weight.). Liver Function Tests: Recent Labs  Lab 02/19/21 0733  AST 17  ALT 20  ALKPHOS 76  BILITOT 1.0  PROT 7.3  ALBUMIN 3.2*   Recent Labs  Lab 02/19/21 0733  LIPASE 23   No results for input(s): AMMONIA in the last 168 hours. Coagulation Profile: Recent Labs  Lab 02/19/21 1002  INR 1.1   Cardiac Enzymes: No results for input(s): CKTOTAL, CKMB, CKMBINDEX, TROPONINI in the last 168 hours. BNP (last 3 results) No results for input(s):  PROBNP in the last 8760 hours. HbA1C: No results for input(s): HGBA1C in the last 72 hours. CBG: No results for input(s): GLUCAP in the last 168 hours. Lipid Profile: No results for input(s): CHOL, HDL, LDLCALC, TRIG, CHOLHDL, LDLDIRECT in the last 72 hours. Thyroid Function Tests: No results for input(s): TSH, T4TOTAL, FREET4, T3FREE, THYROIDAB in the last 72 hours. Anemia Panel: No results for input(s): VITAMINB12, FOLATE, FERRITIN, TIBC, IRON, RETICCTPCT in the last 72 hours. Urine analysis:    Component Value Date/Time   COLORURINE AMBER (A) 08/20/2019 2313   APPEARANCEUR HAZY (A) 08/20/2019 2313   LABSPEC 1.020 12/20/2020 1727   PHURINE 5.5 12/20/2020 1727   GLUCOSEU NEGATIVE 12/20/2020 1727   HGBUR LARGE (A) 12/20/2020 1727   BILIRUBINUR NEGATIVE 12/20/2020 1727   BILIRUBINUR negative 08/18/2019 [place] 12/20/2020 1727   PROTEINUR NEGATIVE 12/20/2020 1727   UROBILINOGEN 0.2 12/20/2020 1727   NITRITE NEGATIVE 12/20/2020 1727   LEUKOCYTESUR NEGATIVE 12/20/2020 1727   Sepsis Labs: No results found for this or any previous visit (from the past 240 hour(s)).   Radiological Exams on Admission: CT Abdomen Pelvis W Contrast  Result Date: 02/19/2021 CLINICAL DATA:  60 year old male with mid abdominal pain. Query bowel  obstruction. EXAM: CT ABDOMEN AND PELVIS WITH CONTRAST TECHNIQUE: Multidetector CT imaging of the abdomen and pelvis was performed using the standard protocol following bolus administration of intravenous contrast. CONTRAST:  150m OMNIPAQUE IOHEXOL 350 MG/ML SOLN COMPARISON:  CT Abdomen and Pelvis 08/21/2019 and earlier. FINDINGS: Lower chest: Calcified coronary artery atherosclerosis. No cardiomegaly or pericardial effusion. Chronic left lung base atelectasis. Hepatobiliary: Chronic hepatic steatosis. Tiny layering gallstones suspected. No gallbladder inflammation or bile duct enlargement. Pancreas: Atrophied. Spleen: Stable and negative. Adrenals/Urinary Tract: Normal adrenal glands. Extensive bilateral nephrolithiasis but no hydronephrosis or hydroureter. Mildly distended but otherwise unremarkable bladder. Small exophytic right renal midpole cyst has decreased in size since 2017, benign. Stomach/Bowel: Retained stool in the distal large bowel. Complex chronic ventral abdominal hernia containing both large and small bowel loops and mesentery. The proximal sigmoid and distal descending colon is herniated but not incarcerated. Upstream large bowel remains normal. There is trace free fluid in the right lower quadrant, but no discrete pericecal inflammation. No enlarged or abnormal appendix is identified. The terminal ileum is decompressed, and distal small bowel exits the large left abdominal hernia on series 3, image 84. Multiple small bowel loops appear incarcerated within the complex hernia in a multiloculated fashion (see coronal image 34). And there is an upstream small bowel obstruction, with the proximal loop entering the hernia on coronal image 42. Upstream fluid-filled and mildly inflamed small bowel loops are individually dilated to 43 mm. The stomach and duodenum are fluid-filled but decompressed. Small-bowel dilates in the proximal to mid jejunum. No free air. Trace free fluid in the herniated  mesentery. Overall the hernia sac measures about 24 mm diameter. Vascular/Lymphatic: Aortoiliac calcified atherosclerosis. Major arterial structures in the abdomen and pelvis are patent. Portal venous system is patent. No lymphadenopathy. Reproductive: Negative. Other: No pelvic free fluid. Musculoskeletal: Osteopenia. Chronic but ununited left posterior 10th rib fracture is new since last year. No acute osseous abnormality identified. IMPRESSION: 1. Large and complex, multilocular-appearing ventral abdominal hernia with incarcerated small bowel and subsequent fairly high-grade small bowel obstruction. Herniated left colon also, but not incarcerated. No free air. Small volume free fluid. 2. Chronic appearing but ununited left 10th rib fracture is new since last year. Chronic hepatic steatosis.  Extensive bilateral nephrolithiasis.  Calcified coronary artery atherosclerosis.Aortic Atherosclerosis (ICD10-I70.0). Electronically Signed   By: Genevie Ann M.D.   On: 02/19/2021 09:42    EKG: Independently reviewed. sinus rhythm at 90 bpm  Assessment/Plan Small bowel obstruction with incarcerated ventral hernia: Acute.  Patient presents with complaints of pain around his navel and nausea over the last 2 days.  Tenderness palpation and decreased bowel sounds.  CT scan of the abdomen significant for large ventral hernia with signs of incarceration incarceration of small bowel with high-grade obstruction.  Risk factors include two prior ventral hernia surgeries in 2015 and 2018.  General surgery consulted and orders placed for NG tube placement.  Hopefully -Admit to a progressive bed -N.p.o. -Continue NGT to suction -Serial abdominal x-rays -Normal saline IV fluids at 75 mL /hr -Zofran IV as needed for nausea and vomiting -Fentanyl IV as needed for pain -Appreciate general surgery consultative services, we will follow-up for further recommendations  Leukocytosis: WBC elevated at 13.6.  Suspect reactive in nature to  above. -Recheck CBC tomorrow morning  CAD: Patient status post stent placement to mid RCA last month after presenting with NSTEMI.  2D echo noted EF of 55 to 60% with grade 1 diastolic dysfunction at that time. -Plavix and statin currently on hold due to patient being n.p.o.  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  He has been on anticoagulation ever since and last took Eliquis yesterday. -Heparin drip per pharmacy  Essential hypertension: Home blood pressure medications include metoprolol succinate 50 mg daily. -Labetalol IV as needed for elevated blood pressures  Diabetes mellitus type 2, uncontrolled: Last hemoglobin A1c was 9.5 on 01/01/2021.  On admission 173.  Home medications include metformin 1000 mg twice daily and Jardiance 10 mg daily. -Hypoglycemic protocols -Hold home medication regimen -CBGs daily -Consider adding on sliding scale insulin and blood sugars appear to be trending greater than 180  GERD -Protonix changed to IV  S/p repair of recurrent ventral hernia  OSA on CPAP -Continue CPAP nightly  Morbid obesity: BMI 42.2 kg/m  DVT prophylaxis: Heparin per pharmacy Code Status: Full Family Communication: None requested Disposition Plan: Hopefully discharge home once medically stable Consults called: General surgery Admission status: Inpatient, require more than 2 midnight stay  Norval Morton MD [facility]   If 7PM-7AM, please contact night-coverage   02/19/2021, 11:37 AM

## 2021-02-19 NOTE — Progress Notes (Signed)
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   Medical History: Past Medical History:  Diagnosis Date   CAD (coronary artery disease)    Diabetes mellitus, type II ([place]) 02/2013   Diabetic ulcer of right foot due to type 2 diabetes mellitus ([place]) 03/21/2018   DVT (deep venous thrombosis) ([place]) 05/04/2012   LLE   Exertional dyspnea 05/04/2012   "isolated episode" (05/05/2012)   Hepatic steatosis    History of chickenpox    Hypertension 03/21/2018   Obesity, Class III, BMI 40-49.9 (morbid obesity) ([place]) 03/21/2018   OSA on CPAP    Peripheral vascular disease (HCC)    varicose veins   Pulmonary embolism ([place]) 05/04/2012   bilaterally   Pyelonephritis 04/2017   Small bowel obstruction ([place]) 09/06/2015   Varicose veins     Medications: see MAR  Assessment: 60 yo M presenting with SBO with incarcerated ventral hernia. Pt is NPO and heparin consult to continue therapeutic AC while holding PTA Eliquis. Last dose of Eliquis 9/24 PM per med rec.   CBC at baseline.  Goal of Therapy:  Heparin level 0.3-0.7 units/ml Heparin level 66-102 units/ml Monitor platelets by anticoagulation protocol: Yes   Plan: Holding Eliquis Start heparin infusion at 1850units/hr Check aPTT in 6h (will use instead of HL since it will be falsely elevated due to  PTA DOAC use) Monitor daily aPTT, HL and CBC  Joetta Manners, PharmD, [facility] Emergency Medicine Clinical Pharmacist ED RPh Phone: [place]: [phone]

## 2021-02-19 NOTE — ED Notes (Signed)
NG tube placed back on low wall intermittent suction at this time.

## 2021-02-19 NOTE — ED Provider Notes (Signed)
[facility] EMERGENCY DEPARTMENT Provider Note   CSN: [id number] Arrival date & time: 02/19/21  0626     History Chief Complaint  Patient presents with   Abdominal Pain    Evan Leonard is a 60 y.o. male.  Extensive past medical history including history of coronary disease, DVT/PE, DM  Has history of complex ventral hernia that has undergone multiple repairs.  Presents for abdominal pain nausea.  Initially started with slight nausea on Saturday and then yesterday nausea became worse and had associated abdominal pain.  Pain more persistent, constant today.  Passing slight amount of gas.  No vomiting.  No appetite. HPI     Past Medical History:  Diagnosis Date   CAD (coronary artery disease)    Diabetes mellitus, type II ([place]) 02/2013   Diabetic ulcer of right foot due to type 2 diabetes mellitus ([place]) 03/21/2018   DVT (deep venous thrombosis) ([place]) 05/04/2012   LLE   Exertional dyspnea 05/04/2012   "isolated episode" (05/05/2012)   Hepatic steatosis    History of chickenpox    Hypertension 03/21/2018   Obesity, Class III, BMI 40-49.9 (morbid obesity) ([place]) 03/21/2018   OSA on CPAP    Peripheral vascular disease (HCC)    varicose veins   Pulmonary embolism ([place]) 05/04/2012   bilaterally   Pyelonephritis 04/2017   Small bowel obstruction ([place]) 09/06/2015   Varicose veins     Patient Active Problem List   Diagnosis Date Noted   CAD (coronary artery disease) 01/09/2021   Eczema 12/27/2019   Erectile dysfunction 12/27/2019   SBO (small bowel obstruction) ([place]) 08/20/2018   History of diabetic ulcer of foot 03/21/2018   Atypical chest pain 02/09/2018   Renal stone 05/12/2017   S/P repair of recurrent ventral hernia 04/26/2017   Chronic venous insufficiency 01/25/2015   Diabetes mellitus with hyperglycemia ([place]) 03/31/2013   Hyponatremia 03/23/2013   Morbid obesity with body mass index of 40.0-44.9 in adult ([place]) 05/05/2012   Obstructive sleep  apnea 05/05/2012   DVT of left distal popliteal vein 05/05/2012    Past Surgical History:  Procedure Laterality Date   CORONARY STENT INTERVENTION N/A 01/02/2021   Procedure: CORONARY STENT INTERVENTION;  Surgeon: Jettie Booze, MD;  Location: [place] [facility];  Service: Cardiovascular;  Laterality: N/A;   CYSTOSCOPY W/ URETERAL STENT PLACEMENT Right 05/12/2017   Procedure: CYSTOSCOPY WITH RETROGRADE PYELOGRAM/URETERAL STENT PLACEMENT;  Surgeon: [name], MD;  Location: [facility];  Service: Urology;  Laterality: Right;   CYSTOSCOPY/URETEROSCOPY/HOLMIUM LASER/STENT PLACEMENT Right 07/29/2017   Procedure: CYSTOSCOPY RIGHT URETEROSCOPY HOLMIUM LASER STENT EXCHANGE;  Surgeon: [name], MD;  Location: [facility];  Service: Urology;  Laterality: Right;   HERNIA REPAIR     2015   LAPAROTOMY N/A 12/13/2013   Procedure: EXPLORATORY LAPAROTOMY Repair ventral hernia, without mesh, partial omentectomy;  Surgeon: Gwenyth Ober, MD;  Location: [facility];  Service: General;  Laterality: N/A;   LEFT HEART CATH AND CORONARY ANGIOGRAPHY N/A 01/02/2021   Procedure: LEFT HEART CATH AND CORONARY ANGIOGRAPHY;  Surgeon: Jettie Booze, MD;  Location: [place] [facility];  Service: Cardiovascular;  Laterality: N/A;   VENTRAL HERNIA REPAIR N/A 04/26/2017   Procedure: INCARCERATED HERNIA REPAIR VENTRAL ADULT;  Surgeon: Georganna Skeans, MD;  Location: [facility];  Service: General;  Laterality: N/A;       Family History  Problem Relation Age of Onset   Diabetes Mother        died age 80- also bleeding  ulcer   Hypertension Mother    Heart failure Father        in his 62s (patient was 40). day before surgery planned   Other Maternal Grandmother        states natural causes all grandparents    Social History   Tobacco Use   Smoking status: Never   Smokeless tobacco: Never  Vaping Use   Vaping Use: Never used  Substance Use Topics   Alcohol use: No    Alcohol/week: 0.0 standard drinks   Drug use: No     Home Medications Prior to Admission medications   Medication Sig Start Date End Date Taking? Authorizing Provider  ACCU-CHEK GUIDE test strip USE AS DIRECTED UP TO 4 TIMES DAILY Patient taking differently: 1 each by Other route 4 (four) times daily. 12/30/19   [name], MD  atorvastatin (LIPITOR) 80 MG tablet Take 1 tablet (80 mg total) by mouth daily. 02/02/21   [name], MD  blood glucose meter kit and supplies KIT Dispense based on patient and insurance preference. Use up to four times daily as directed. E11.65 Patient taking differently: Inject 1 each into the skin See admin instructions. Dispense based on patient and insurance preference. Use up to four times daily as directed. E11.65 12/09/19   [name], MD  clopidogrel (PLAVIX) 75 MG tablet Take 1 tablet (75 mg total) by mouth daily with breakfast. 02/02/21   [name], MD  ELIQUIS 5 MG TABS tablet TAKE 1 TABLET BY MOUTH TWICE A DAY 10/30/20   [name], MD  empagliflozin (JARDIANCE) 10 MG TABS tablet Take 1 tablet (10 mg total) by mouth daily. 01/19/21   [name], MD  metFORMIN (GLUCOPHAGE XR) 500 MG 24 hr tablet Take 2 tablets (1,000 mg total) by mouth in the morning and at bedtime. Restart on 8/12 01/05/21   Elgergawy, Silver Huguenin, MD  metoprolol succinate (TOPROL-XL) 50 MG 24 hr tablet Take 1 tablet (50 mg total) by mouth daily. 01/09/21   [name], MD  Multiple Vitamin (MULTIVITAMIN WITH MINERALS) TABS tablet Take 1 tablet by mouth daily.     [provider]  nitroGLYCERIN (NITROSTAT) 0.4 MG SL tablet Place 1 tablet (0.4 mg total) under the tongue every 5 (five) minutes as needed for chest pain. 01/03/21   Elgergawy, Silver Huguenin, MD  pantoprazole (PROTONIX) 40 MG tablet Take 1 tablet (40 mg total) by mouth daily. 02/02/21   [name], MD  promethazine-dextromethorphan (PROMETHAZINE-DM) 6.25-15 MG/5ML syrup Take 5 mLs by mouth at bedtime as needed for cough. 12/20/20   Jaynee Eagles, PA-C  Semaglutide, 1 MG/DOSE, (OZEMPIC, 1 MG/DOSE,) 2 MG/1.5ML SOPN Inject 1 mg into the skin once a week. 02/02/21   [name], MD  triamcinolone ointment (KENALOG) 0.5 % Apply 1 application topically 2 (two) times daily. For 7-14 days maximum 12/27/19   [name], MD    Allergies    Patient has no known allergies.  Review of Systems   Review of Systems  Constitutional:  Negative for chills and fever.  HENT:  Negative for ear pain and sore throat.   Eyes:  Negative for pain and visual disturbance.  Respiratory:  Negative for cough and shortness of breath.   Cardiovascular:  Negative for chest pain and palpitations.  Gastrointestinal:  Positive for abdominal distention, abdominal pain and nausea. Negative for vomiting.  Genitourinary:  Negative for dysuria and hematuria.  Musculoskeletal:  Negative for  arthralgias and back pain.  Skin:  Negative for color change and rash.  Neurological:  Negative for seizures and syncope.  All other systems reviewed and are negative.  Physical Exam Updated Vital Signs BP 128/64   Pulse 85   Temp 98.4 F (36.9 C) (Oral)   Resp 16   SpO2 96%   Physical Exam Vitals and nursing note reviewed.  Constitutional:      Appearance: He is well-developed.  HENT:     Head: Normocephalic and atraumatic.  Eyes:     Conjunctiva/sclera: Conjunctivae normal.  Cardiovascular:     Rate and Rhythm: Normal rate and regular rhythm.     Heart sounds: No murmur heard. Pulmonary:     Effort: Pulmonary effort is normal. No respiratory distress.     Breath sounds: Normal breath sounds.  Abdominal:     Palpations: Abdomen is soft.     Tenderness: There is abdominal tenderness. There is no guarding or rebound.     Comments: Generalized tenderness to palpation, worse in the mid abdomen  Musculoskeletal:     Cervical back: Neck supple.  Skin:    General: Skin is warm and dry.  Neurological:     General: No focal deficit present.     Mental  Status: He is alert.    ED Results / Procedures / Treatments   Labs (all labs ordered are listed, but only abnormal results are displayed) Labs Reviewed  COMPREHENSIVE METABOLIC PANEL - Abnormal; Notable for the following components:      Result Value   CO2 21 (*)    Glucose, Bld 173 (*)    Albumin 3.2 (*)    All other components within normal limits  CBC WITH DIFFERENTIAL/PLATELET - Abnormal; Notable for the following components:   WBC 13.6 (*)    Neutro Abs 11.1 (*)    Monocytes Absolute 1.2 (*)    All other components within normal limits  RESP PANEL BY RT-PCR (FLU A&B, COVID) ARPGX2  LIPASE, BLOOD  URINALYSIS, ROUTINE W REFLEX MICROSCOPIC  PROTIME-INR  TYPE AND SCREEN    EKG None  Radiology CT Abdomen Pelvis W Contrast  Result Date: 02/19/2021 CLINICAL DATA:  60 year old male with mid abdominal pain. Query bowel obstruction. EXAM: CT ABDOMEN AND PELVIS WITH CONTRAST TECHNIQUE: Multidetector CT imaging of the abdomen and pelvis was performed using the standard protocol following bolus administration of intravenous contrast. CONTRAST:  161mL OMNIPAQUE IOHEXOL 350 MG/ML SOLN COMPARISON:  CT Abdomen and Pelvis 08/21/2019 and earlier. FINDINGS: Lower chest: Calcified coronary artery atherosclerosis. No cardiomegaly or pericardial effusion. Chronic left lung base atelectasis. Hepatobiliary: Chronic hepatic steatosis. Tiny layering gallstones suspected. No gallbladder inflammation or bile duct enlargement. Pancreas: Atrophied. Spleen: Stable and negative. Adrenals/Urinary Tract: Normal adrenal glands. Extensive bilateral nephrolithiasis but no hydronephrosis or hydroureter. Mildly distended but otherwise unremarkable bladder. Small exophytic right renal midpole cyst has decreased in size since 2017, benign. Stomach/Bowel: Retained stool in the distal large bowel. Complex chronic ventral abdominal hernia containing both large and small bowel loops and mesentery. The proximal sigmoid and  distal descending colon is herniated but not incarcerated. Upstream large bowel remains normal. There is trace free fluid in the right lower quadrant, but no discrete pericecal inflammation. No enlarged or abnormal appendix is identified. The terminal ileum is decompressed, and distal small bowel exits the large left abdominal hernia on series 3, image 84. Multiple small bowel loops appear incarcerated within the complex hernia in a multiloculated fashion (see coronal image 34). And there  is an upstream small bowel obstruction, with the proximal loop entering the hernia on coronal image 42. Upstream fluid-filled and mildly inflamed small bowel loops are individually dilated to 43 mm. The stomach and duodenum are fluid-filled but decompressed. Small-bowel dilates in the proximal to mid jejunum. No free air. Trace free fluid in the herniated mesentery. Overall the hernia sac measures about 24 mm diameter. Vascular/Lymphatic: Aortoiliac calcified atherosclerosis. Major arterial structures in the abdomen and pelvis are patent. Portal venous system is patent. No lymphadenopathy. Reproductive: Negative. Other: No pelvic free fluid. Musculoskeletal: Osteopenia. Chronic but ununited left posterior 10th rib fracture is new since last year. No acute osseous abnormality identified. IMPRESSION: 1. Large and complex, multilocular-appearing ventral abdominal hernia with incarcerated small bowel and subsequent fairly high-grade small bowel obstruction. Herniated left colon also, but not incarcerated. No free air. Small volume free fluid. 2. Chronic appearing but ununited left 10th rib fracture is new since last year. Chronic hepatic steatosis.  Extensive bilateral nephrolithiasis. Calcified coronary artery atherosclerosis.Aortic Atherosclerosis (ICD10-I70.0). Electronically Signed   By: Genevie Ann M.D.   On: 02/19/2021 09:42    Procedures Procedures   Medications Ordered in ED Medications  ondansetron (ZOFRAN) injection 4 mg  (has no administration in time range)  iohexol (OMNIPAQUE) 350 MG/ML injection 100 mL (100 mLs Intravenous Contrast Given 02/19/21 3005)    ED Course  I have reviewed the triage vital signs and the nursing notes.  Pertinent labs & imaging results that were available during my care of the patient were reviewed by me and considered in my medical decision making (see chart for details).    MDM Rules/Calculators/A&P                           60 year old male presenting to ER with concern for abdominal pain, nausea.  CT scan concerning for large ventral abdominal hernia with incarcerated small bowel and subsequent high-grade small bowel obstruction.  Consulted general surgery.  Final Clinical Impression(s) / ED Diagnoses Final diagnoses:  Ventral hernia with bowel obstruction  Intestinal obstruction, unspecified cause, unspecified whether partial or complete [facility])    Rx / DC Orders ED Discharge Orders     None        Lucrezia Starch, MD 02/21/21 [id number]

## 2021-02-19 NOTE — Progress Notes (Addendum)
HOSPITAL MEDICINE OVERNIGHT EVENT NOTE    Notified by nursing the patient is continuing to complain of abdominal pain, being that 50 mcg of fentanyl was the only amount of fentanyl that helped with his pain.  Current standing order only includes 25 mcg every 2 hours as needed.  Will adjust fentanyl orders to include 25 mcg of fentanyl every 2 hours as needed for moderate pain and 50 mg as needed for severe pain.  Patient should continue to be monitored closely with serial abdominal examinations and frequent vital signs to ensure hemodynamic stability.  Vernelle Emerald  MD [facility]   ADDENDUM (9/26 11:30PM)  Patient continues to complain that the as needed fentanyl is not effective in managing his pain.  Nursing reports that serial abdominal exam reveals no change from initial hospitalization.  We will go ahead and transition patient to as needed intravenous Dilaudid, 0.5 mg for moderate pain and 1 mg for severe pain.  Sherryll Burger Leam Madero  ADDENDUM (9/27 605AM)  Patient accidentally pulled his NG tube out.  Nursing currently attempting to replace.  Sherryll Burger Johnell Bas

## 2021-02-19 NOTE — Consult Note (Signed)
Consult Note  Evan Leonard Jul 13, 1960  [id number].    Requesting MD: Madalyn Rob, MD Chief Complaint/Reason for Consult: ventral hernia   HPI:  Patient is a 60 year old male who presented to [facility] this AM with abdominal pain and nausea since Saturday. Pain is located around umbilicus and has progressively worsened and become more constant. Patient reports associated nausea but no emesis, constipation. He is still passing a little flatus. He denies fever, chills, chest pain, SOB, urinary symptoms. He has had previous hernia repairs, the most recent being in 2018. He was treated conservatively for SBO related to hernia last year in March. Patient is usually on eliquis and plavix for hx of DVT/PE and recent cardiac stent placement last month. He reports the last dose he took of either of these medications was Saturday AM. NKDA.   ROS: Review of Systems  Constitutional:  Negative for chills and fever.  Respiratory:  Negative for shortness of breath and wheezing.   Cardiovascular:  Negative for chest pain and palpitations.  Gastrointestinal:  Positive for abdominal pain, constipation and nausea. Negative for blood in stool, diarrhea, melena and vomiting.  Genitourinary:  Negative for dysuria, frequency and urgency.  All other systems reviewed and are negative.  Family History  Problem Relation Age of Onset   Diabetes Mother        died age 40- also bleeding ulcer   Hypertension Mother    Heart failure Father        in his 33s (patient was 76). day before surgery planned   Other Maternal Grandmother        states natural causes all grandparents    Past Medical History:  Diagnosis Date   CAD (coronary artery disease)    Diabetes mellitus, type II ([place]) 02/2013   Diabetic ulcer of right foot due to type 2 diabetes mellitus ([place]) 03/21/2018   DVT (deep venous thrombosis) ([place]) 05/04/2012   LLE   Exertional dyspnea 05/04/2012   "isolated episode" (05/05/2012)    Hepatic steatosis    History of chickenpox    Hypertension 03/21/2018   Obesity, Class III, BMI 40-49.9 (morbid obesity) ([place]) 03/21/2018   OSA on CPAP    Peripheral vascular disease (HCC)    varicose veins   Pulmonary embolism ([place]) 05/04/2012   bilaterally   Pyelonephritis 04/2017   Small bowel obstruction ([place]) 09/06/2015   Varicose veins     Past Surgical History:  Procedure Laterality Date   CORONARY STENT INTERVENTION N/A 01/02/2021   Procedure: CORONARY STENT INTERVENTION;  Surgeon: Jettie Booze, MD;  Location: [facility];  Service: Cardiovascular;  Laterality: N/A;   CYSTOSCOPY W/ URETERAL STENT PLACEMENT Right 05/12/2017   Procedure: CYSTOSCOPY WITH RETROGRADE PYELOGRAM/URETERAL STENT PLACEMENT;  Surgeon: [name], MD;  Location: [facility];  Service: Urology;  Laterality: Right;   CYSTOSCOPY/URETEROSCOPY/HOLMIUM LASER/STENT PLACEMENT Right 07/29/2017   Procedure: CYSTOSCOPY RIGHT URETEROSCOPY HOLMIUM LASER STENT EXCHANGE;  Surgeon: [name], MD;  Location: [facility];  Service: Urology;  Laterality: Right;   HERNIA REPAIR     2015   LAPAROTOMY N/A 12/13/2013   Procedure: EXPLORATORY LAPAROTOMY Repair ventral hernia, without mesh, partial omentectomy;  Surgeon: Gwenyth Ober, MD;  Location: [facility];  Service: General;  Laterality: N/A;   LEFT HEART CATH AND CORONARY ANGIOGRAPHY N/A 01/02/2021   Procedure: LEFT HEART CATH AND CORONARY ANGIOGRAPHY;  Surgeon: Jettie Booze, MD;  Location: [facility];  Service: Cardiovascular;  Laterality:  N/A;   VENTRAL HERNIA REPAIR N/A 04/26/2017   Procedure: INCARCERATED HERNIA REPAIR VENTRAL ADULT;  Surgeon: Georganna Skeans, MD;  Location: [facility];  Service: General;  Laterality: N/A;    Social History:  reports that he has never smoked. He has never used smokeless tobacco. He reports that he does not drink alcohol and does not use drugs.  Allergies: No Known Allergies  (Not in a hospital admission)   Blood pressure  (!) 148/83, pulse 81, temperature 98.4 F (36.9 C), temperature source Oral, resp. rate 16, height 6\' 2"  (1.88 m), weight (!) 149.1 kg, SpO2 96 %. Physical Exam:  General: pleasant, WD, morbidly obese male who is laying in bed in NAD HEENT: head is normocephalic, atraumatic.  Sclera are noninjected.  PERRL.  Ears and nose without any masses or lesions.  Mouth is pink and moist Heart: regular, rate, and rhythm.  Normal s1,s2. No obvious murmurs, gallops, or rubs noted.  Palpable radial pulses bilaterally  Lungs: CTAB, no wheezes, rhonchi, or rales noted.  Respiratory effort nonlabored Abd: soft, ttp over ventral hernia although hernia defect difficult to palpate with body habitus, there is no peritonitis, abdomen is soft otherwise, prior surgical scars MS: mild BLE edema with skin changes secondary to PVD Skin: warm and dry with no masses, lesions, or rashes Neuro: Cranial nerves 2-12 grossly intact, sensation is normal throughout Psych: A&Ox3 with an appropriate affect.   Results for orders placed or performed during the hospital encounter of 02/19/21 (from the past 48 hour(s))  Comprehensive metabolic panel     Status: Abnormal   Collection Time: 02/19/21  7:33 AM  Result Value Ref Range   Sodium 136 135 - 145 mmol/L   Potassium 3.9 3.5 - 5.1 mmol/L   Chloride 104 98 - 111 mmol/L   CO2 21 (L) 22 - 32 mmol/L   Glucose, Bld 173 (H) 70 - 99 mg/dL    Comment: Glucose reference range applies only to samples taken after fasting for at least 8 hours.   BUN 17 8 - 23 mg/dL   Creatinine, Ser 1.02 0.61 - 1.24 mg/dL   Calcium 9.1 8.9 - 10.3 mg/dL   Total Protein 7.3 6.5 - 8.1 g/dL   Albumin 3.2 (L) 3.5 - 5.0 g/dL   AST 17 15 - 41 U/L   ALT 20 0 - 44 U/L   Alkaline Phosphatase 76 38 - 126 U/L   Total Bilirubin 1.0 0.3 - 1.2 mg/dL   GFR, Estimated >60 >60 mL/min    Comment: (NOTE) Calculated using the CKD-EPI Creatinine Equation (2021)    Anion gap 11 5 - 15    Comment: Performed at [facility] [address], [place], [place] [address]  Lipase, blood     Status: None   Collection Time: 02/19/21  7:33 AM  Result Value Ref Range   Lipase 23 11 - 51 U/L    Comment: Performed at [facility] [address] [place], [place] [address]  CBC with Differential     Status: Abnormal   Collection Time: 02/19/21  7:33 AM  Result Value Ref Range   WBC 13.6 (H) 4.0 - 10.5 K/uL   RBC 5.19 4.22 - 5.81 MIL/uL   Hemoglobin 15.2 13.0 - 17.0 g/dL   HCT 47.6 39.0 - 52.0 %   MCV 91.7 80.0 - 100.0 fL   MCH 29.3 26.0 - 34.0 pg   MCHC 31.9 30.0 - 36.0 g/dL   RDW 14.5  11.5 - 15.5 %   Platelets 259 150 - 400 K/uL   nRBC 0.0 0.0 - 0.2 %   Neutrophils Relative % 82 %   Neutro Abs 11.1 (H) 1.7 - 7.7 K/uL   Lymphocytes Relative 7 %   Lymphs Abs 1.0 0.7 - 4.0 K/uL   Monocytes Relative 9 %   Monocytes Absolute 1.2 (H) 0.1 - 1.0 K/uL   Eosinophils Relative 2 %   Eosinophils Absolute 0.3 0.0 - 0.5 K/uL   Basophils Relative 0 %   Basophils Absolute 0.1 0.0 - 0.1 K/uL   Immature Granulocytes 0 %   Abs Immature Granulocytes 0.03 0.00 - 0.07 K/uL    Comment: Performed at [facility] [address] [place], [place] [address]  ABO/Rh     Status: None   Collection Time: 02/19/21  7:33 AM  Result Value Ref Range   ABO/RH(D)      A POS Performed at [facility] [address], [place], [place] [address]   Protime-INR     Status: None   Collection Time: 02/19/21 10:02 AM  Result Value Ref Range   Prothrombin Time 14.1 11.4 - 15.2 seconds   INR 1.1 0.8 - 1.2    Comment: (NOTE) INR goal varies based on device and disease states. Performed at [facility], [place] [address], [place], [place] [address]   Type and screen [place]     Status: None   Collection Time: 02/19/21 10:19 AM  Result Value Ref Range   ABO/RH(D) A POS    Antibody Screen NEG    Sample Expiration      02/22/2021,2359 Performed at [facility], [place] [address] [place], [place] [address]    CT Abdomen Pelvis W Contrast  Result Date: 02/19/2021 CLINICAL DATA:  60 year old male with mid abdominal pain. Query bowel obstruction. EXAM: CT ABDOMEN AND PELVIS WITH CONTRAST TECHNIQUE: Multidetector CT imaging of the abdomen and pelvis was performed using the standard protocol following bolus administration of intravenous contrast. CONTRAST:  123mL OMNIPAQUE IOHEXOL 350 MG/ML SOLN COMPARISON:  CT Abdomen and Pelvis 08/21/2019 and earlier. FINDINGS: Lower chest: Calcified coronary artery atherosclerosis. No cardiomegaly or pericardial effusion. Chronic left lung base atelectasis. Hepatobiliary: Chronic hepatic steatosis. Tiny layering gallstones suspected. No gallbladder inflammation or bile duct enlargement. Pancreas: Atrophied. Spleen: Stable and negative. Adrenals/Urinary Tract: Normal adrenal glands. Extensive bilateral nephrolithiasis but no hydronephrosis or hydroureter. Mildly distended but otherwise unremarkable bladder. Small exophytic right renal midpole cyst has decreased in size since 2017, benign. Stomach/Bowel: Retained stool in the distal large bowel. Complex chronic ventral abdominal hernia containing both large and small bowel loops and mesentery. The proximal sigmoid and distal descending colon is herniated but not incarcerated. Upstream large bowel remains normal. There is trace free fluid in the right lower quadrant, but no discrete pericecal inflammation. No enlarged or abnormal appendix is identified. The terminal ileum is decompressed, and distal small bowel exits the large left abdominal hernia on series 3, image 84. Multiple small bowel loops appear incarcerated within the complex hernia in a multiloculated fashion (see coronal image 34). And there is an upstream small bowel obstruction, with the proximal loop entering the hernia on coronal image 42. Upstream fluid-filled and mildly inflamed small bowel loops are individually dilated to 43 mm. The  stomach and duodenum are fluid-filled but decompressed. Small-bowel dilates in the proximal to mid jejunum. No free air. Trace free fluid in the herniated mesentery.  Overall the hernia sac measures about 24 mm diameter. Vascular/Lymphatic: Aortoiliac calcified atherosclerosis. Major arterial structures in the abdomen and pelvis are patent. Portal venous system is patent. No lymphadenopathy. Reproductive: Negative. Other: No pelvic free fluid. Musculoskeletal: Osteopenia. Chronic but ununited left posterior 10th rib fracture is new since last year. No acute osseous abnormality identified. IMPRESSION: 1. Large and complex, multilocular-appearing ventral abdominal hernia with incarcerated small bowel and subsequent fairly high-grade small bowel obstruction. Herniated left colon also, but not incarcerated. No free air. Small volume free fluid. 2. Chronic appearing but ununited left 10th rib fracture is new since last year. Chronic hepatic steatosis.  Extensive bilateral nephrolithiasis. Calcified coronary artery atherosclerosis.Aortic Atherosclerosis (ICD10-I70.0). Electronically Signed   By: Genevie Ann M.D.   On: 02/19/2021 09:42      Assessment/Plan Recurrent Complex ventral hernia with SBO - CT with large multilocular ventral hernia containing small bowel and L colon with SBO - hernia difficult to fully palpate given body habitus, but patient is tender over periumbilical abdomen - recommend NGT placement to see if we can try to decompress things - patient may ultimately need surgical intervention but would like to try to treat conservatively first given recent stent placement - will have MD review CT as well and see if he thinks this is appropriate  CAD s/p Recent cardiac stent placement 01/02/21 - recommend cards consult to weigh in on both surgical risk and need for anticoagulation. Pt has been off eliquis and plavix since Saturday PM  FEN: NPO, IVF, NGT to be placed VTE: SCDs ID: no current  abx  HTN HLD Hx of DVT/PE 2013 T2DM Peripheral vascular disease OSA  Morbid obesity - BMI 42.2  Norm Parcel, PA-C [facility] 02/19/2021, 11:57 AM Please see Amion for pager number during day hours 7:00am-4:30pm

## 2021-02-19 NOTE — ED Notes (Addendum)
Pt called out and states that NG tube has came out of his nose. Tape was still applied on nose but not to the tube. New NG tube placed in the Right nare 27fr. Chest x-ray ordered.

## 2021-02-20 ENCOUNTER — Inpatient Hospital Stay (HOSPITAL_COMMUNITY): Payer: PRIVATE HEALTH INSURANCE

## 2021-02-20 ENCOUNTER — Encounter (HOSPITAL_COMMUNITY): Payer: Self-pay | Admitting: Internal Medicine

## 2021-02-20 ENCOUNTER — Encounter (HOSPITAL_COMMUNITY)
Admission: EM | Disposition: A | Discharge status: Home / Self Care | Payer: Self-pay | Source: Home / Self Care | Attending: Family Medicine

## 2021-02-20 ENCOUNTER — Inpatient Hospital Stay (HOSPITAL_COMMUNITY): Payer: PRIVATE HEALTH INSURANCE | Admitting: Anesthesiology

## 2021-02-20 DIAGNOSIS — Z7901 Long term (current) use of anticoagulants: Secondary | ICD-10-CM | POA: Diagnosis not present

## 2021-02-20 DIAGNOSIS — Z86718 Personal history of other venous thrombosis and embolism: Secondary | ICD-10-CM | POA: Diagnosis not present

## 2021-02-20 DIAGNOSIS — K56609 Unspecified intestinal obstruction, unspecified as to partial versus complete obstruction: Secondary | ICD-10-CM | POA: Diagnosis not present

## 2021-02-20 DIAGNOSIS — E1165 Type 2 diabetes mellitus with hyperglycemia: Secondary | ICD-10-CM | POA: Diagnosis not present

## 2021-02-20 HISTORY — PX: BOWEL RESECTION: SHX1257

## 2021-02-20 HISTORY — PX: VENTRAL HERNIA REPAIR: SHX424

## 2021-02-20 HISTORY — PX: LYSIS OF ADHESION: SHX5961

## 2021-02-20 LAB — BASIC METABOLIC PANEL
Anion gap: 11 (ref 5–15)
BUN: 19 mg/dL (ref 8–23)
CO2: 23 mmol/L (ref 22–32)
Calcium: 8.8 mg/dL — ABNORMAL LOW (ref 8.9–10.3)
Chloride: 105 mmol/L (ref 98–111)
Creatinine, Ser: 1.01 mg/dL (ref 0.61–1.24)
GFR, Estimated: >60 mL/min (ref >60)
Glucose, Bld: 132 mg/dL — ABNORMAL HIGH (ref 70–99)
Potassium: 4 mmol/L (ref 3.5–5.1)
Sodium: 139 mmol/L (ref 135–145)

## 2021-02-20 LAB — GLUCOSE, CAPILLARY
Glucose-Capillary: 118 mg/dL — ABNORMAL HIGH (ref 70–99)
Glucose-Capillary: 132 mg/dL — ABNORMAL HIGH (ref 70–99)

## 2021-02-20 LAB — CBC
HCT: 44.1 % (ref 39.0–52.0)
Hemoglobin: 14.2 g/dL (ref 13.0–17.0)
MCH: 30 pg (ref 26.0–34.0)
MCHC: 32.2 g/dL (ref 30.0–36.0)
MCV: 93.2 fL (ref 80.0–100.0)
Platelets: 213 10*3/uL (ref 150–400)
RBC: 4.73 MIL/uL (ref 4.22–5.81)
RDW: 14.5 % (ref 11.5–15.5)
WBC: 9.4 10*3/uL (ref 4.0–10.5)
nRBC: 0 % (ref 0.0–0.2)

## 2021-02-20 LAB — PROTIME-INR
INR: 1.2 (ref 0.8–1.2)
Prothrombin Time: 15.1 seconds (ref 11.4–15.2)

## 2021-02-20 LAB — APTT
aPTT: 62 seconds — ABNORMAL HIGH (ref 24–36)
aPTT: 28 seconds (ref 24–36)

## 2021-02-20 LAB — HEPARIN LEVEL (UNFRACTIONATED)
Heparin Unfractionated: 0.84 IU/mL — ABNORMAL HIGH (ref 0.30–0.70)
Heparin Unfractionated: 0.24 IU/mL — ABNORMAL LOW (ref 0.30–0.70)

## 2021-02-20 LAB — CBG MONITORING, ED: Glucose-Capillary: 136 mg/dL — ABNORMAL HIGH (ref 70–99)

## 2021-02-20 SURGERY — REPAIR, HERNIA, VENTRAL
Anesthesia: General | Site: Abdomen

## 2021-02-20 MED ORDER — HYDROMORPHONE HCL 1 MG/ML IJ SOLN
INTRAMUSCULAR | Status: AC
  Filled 2021-02-20: qty 0.5

## 2021-02-20 MED ORDER — CEFAZOLIN SODIUM-DEXTROSE 2-4 GM/100ML-% IV SOLN
INTRAVENOUS | Status: AC
  Filled 2021-02-20: qty 100

## 2021-02-20 MED ORDER — HYDROMORPHONE HCL 1 MG/ML IJ SOLN
INTRAMUSCULAR | Status: AC
  Filled 2021-02-20: qty 1

## 2021-02-20 MED ORDER — LIDOCAINE HCL (PF) 2 % IJ SOLN
INTRAMUSCULAR | Status: AC
  Filled 2021-02-20: qty 5

## 2021-02-20 MED ORDER — HYDROMORPHONE HCL 1 MG/ML IJ SOLN
0.2500 mg | INTRAMUSCULAR | Status: DC | PRN
  Administered 2021-02-20 (×2): 0.5 mg via INTRAVENOUS
  Administered 2021-02-20: 0.25 mg via INTRAVENOUS

## 2021-02-20 MED ORDER — CEFAZOLIN IN SODIUM CHLORIDE 3-0.9 GM/100ML-% IV SOLN
INTRAVENOUS | Status: AC
  Filled 2021-02-20: qty 100

## 2021-02-20 MED ORDER — DEXMEDETOMIDINE (PRECEDEX) IN NS 20 MCG/5ML (4 MCG/ML) IV SYRINGE
PREFILLED_SYRINGE | INTRAVENOUS | Status: AC
  Filled 2021-02-20: qty 5

## 2021-02-20 MED ORDER — DEXMEDETOMIDINE (PRECEDEX) IN NS 20 MCG/5ML (4 MCG/ML) IV SYRINGE
PREFILLED_SYRINGE | INTRAVENOUS | Status: DC | PRN
  Administered 2021-02-20: 12 ug via INTRAVENOUS

## 2021-02-20 MED ORDER — ACETAMINOPHEN 10 MG/ML IV SOLN
INTRAVENOUS | Status: AC
  Filled 2021-02-20: qty 100

## 2021-02-20 MED ORDER — CEFAZOLIN IN SODIUM CHLORIDE 3-0.9 GM/100ML-% IV SOLN
3.0000 g | Freq: Once | INTRAVENOUS | Status: AC
  Administered 2021-02-20: 3 g via INTRAVENOUS

## 2021-02-20 MED ORDER — LACTATED RINGERS IV SOLN: INTRAVENOUS | Status: DC

## 2021-02-20 MED ORDER — MIDAZOLAM HCL 2 MG/2ML IJ SOLN
INTRAMUSCULAR | Status: AC
  Filled 2021-02-20: qty 2

## 2021-02-20 MED ORDER — FENTANYL CITRATE (PF) 250 MCG/5ML IJ SOLN
INTRAMUSCULAR | Status: DC | PRN
  Administered 2021-02-20: 150 ug via INTRAVENOUS
  Administered 2021-02-20 (×2): 50 ug via INTRAVENOUS

## 2021-02-20 MED ORDER — ORAL CARE MOUTH RINSE: 15.0000 mL | Freq: Once | OROMUCOSAL | Status: AC

## 2021-02-20 MED ORDER — FENTANYL CITRATE (PF) 250 MCG/5ML IJ SOLN
INTRAMUSCULAR | Status: AC
  Filled 2021-02-20: qty 5

## 2021-02-20 MED ORDER — ONDANSETRON HCL 4 MG/2ML IJ SOLN: 4.0000 mg | Freq: Once | INTRAMUSCULAR | Status: DC | PRN

## 2021-02-20 MED ORDER — ROCURONIUM BROMIDE 10 MG/ML (PF) SYRINGE
PREFILLED_SYRINGE | INTRAVENOUS | Status: AC
  Filled 2021-02-20: qty 10

## 2021-02-20 MED ORDER — ROCURONIUM BROMIDE 10 MG/ML (PF) SYRINGE
PREFILLED_SYRINGE | INTRAVENOUS | Status: DC | PRN
Start: 2021-02-20 — End: 2021-02-20
  Administered 2021-02-20: 50 mg via INTRAVENOUS
  Administered 2021-02-20 (×2): 40 mg via INTRAVENOUS
  Administered 2021-02-20: 10 mg via INTRAVENOUS

## 2021-02-20 MED ORDER — MIDAZOLAM HCL 5 MG/5ML IJ SOLN
INTRAMUSCULAR | Status: DC | PRN
  Administered 2021-02-20: 2 mg via INTRAVENOUS

## 2021-02-20 MED ORDER — PHENYLEPHRINE HCL-NACL 20-0.9 MG/250ML-% IV SOLN
INTRAVENOUS | Status: DC | PRN
  Administered 2021-02-20: 20 ug/min via INTRAVENOUS

## 2021-02-20 MED ORDER — ACETAMINOPHEN 10 MG/ML IV SOLN
INTRAVENOUS | Status: DC | PRN
  Administered 2021-02-20: 1000 mg via INTRAVENOUS

## 2021-02-20 MED ORDER — PROPOFOL 10 MG/ML IV BOLUS
INTRAVENOUS | Status: AC
  Filled 2021-02-20: qty 20

## 2021-02-20 MED ORDER — BUPIVACAINE HCL (PF) 0.25 % IJ SOLN
INTRAMUSCULAR | Status: AC
  Filled 2021-02-20: qty 30

## 2021-02-20 MED ORDER — LIDOCAINE 2% (20 MG/ML) 5 ML SYRINGE
INTRAMUSCULAR | Status: DC | PRN
Start: 2021-02-20 — End: 2021-02-20
  Administered 2021-02-20: 80 mg via INTRAVENOUS

## 2021-02-20 MED ORDER — CHLORHEXIDINE GLUCONATE 0.12 % MT SOLN: 15.0000 mL | Freq: Once | OROMUCOSAL | Status: AC

## 2021-02-20 MED ORDER — SUCCINYLCHOLINE CHLORIDE 200 MG/10ML IV SOSY
PREFILLED_SYRINGE | INTRAVENOUS | Status: DC | PRN
Start: 2021-02-20 — End: 2021-02-20
  Administered 2021-02-20: 140 mg via INTRAVENOUS

## 2021-02-20 MED ORDER — 0.9 % SODIUM CHLORIDE (POUR BTL) OPTIME
TOPICAL | Status: DC | PRN
  Administered 2021-02-20: 1000 mL

## 2021-02-20 MED ORDER — CHLORHEXIDINE GLUCONATE CLOTH 2 % EX PADS
6.0000 | MEDICATED_PAD | Freq: Every day | CUTANEOUS | Status: DC
  Administered 2021-02-20 – 2021-02-26 (×7): 6 via TOPICAL

## 2021-02-20 MED ORDER — CHLORHEXIDINE GLUCONATE 0.12 % MT SOLN
OROMUCOSAL | Status: AC
  Administered 2021-02-20: 15 mL via OROMUCOSAL
  Filled 2021-02-20: qty 15

## 2021-02-20 MED ORDER — PROPOFOL 10 MG/ML IV BOLUS
INTRAVENOUS | Status: DC | PRN
  Administered 2021-02-20: 200 mg via INTRAVENOUS

## 2021-02-20 SURGICAL SUPPLY — 44 items
BAG COUNTER SPONGE SURGICOUNT (BAG) ×3 IMPLANT
BIOPATCH RED 1 DISK 7.0 (GAUZE/BANDAGES/DRESSINGS) ×3 IMPLANT
BLADE CLIPPER SURG (BLADE) ×3 IMPLANT
CANISTER SUCT 3000ML PPV (MISCELLANEOUS) ×3 IMPLANT
CHLORAPREP W/TINT 26 (MISCELLANEOUS) ×3 IMPLANT
COVER SURGICAL LIGHT HANDLE (MISCELLANEOUS) ×3 IMPLANT
DRAIN CHANNEL 19F RND (DRAIN) ×3 IMPLANT
DRAPE LAPAROSCOPIC ABDOMINAL (DRAPES) ×3 IMPLANT
DRSG TEGADERM 4X4.5 CHG (GAUZE/BANDAGES/DRESSINGS) ×3 IMPLANT
ELECT CAUTERY BLADE 6.4 (BLADE) IMPLANT
ELECT REM PT RETURN 9FT ADLT (ELECTROSURGICAL) ×3
ELECTRODE REM PT RTRN 9FT ADLT (ELECTROSURGICAL) ×2 IMPLANT
EVACUATOR SILICONE 100CC (DRAIN) ×3 IMPLANT
GLOVE SURG POLYISO LF SZ7 (GLOVE) ×3 IMPLANT
GLOVE SURG UNDER POLY LF SZ7 (GLOVE) ×3 IMPLANT
GOWN STRL REUS W/ TWL LRG LVL3 (GOWN DISPOSABLE) ×6 IMPLANT
GOWN STRL REUS W/TWL LRG LVL3 (GOWN DISPOSABLE) ×3
KIT BASIN OR (CUSTOM PROCEDURE TRAY) ×3 IMPLANT
KIT TURNOVER KIT B (KITS) ×3 IMPLANT
LIGASURE IMPACT 36 18CM CVD LR (INSTRUMENTS) ×3 IMPLANT
MESH PHASIX RESORB RECT 15X20 (Mesh General) ×3 IMPLANT
NEEDLE 22X1 1/2 (OR ONLY) (NEEDLE) ×3 IMPLANT
NS IRRIG 1000ML POUR BTL (IV SOLUTION) ×3 IMPLANT
PACK GENERAL/GYN (CUSTOM PROCEDURE TRAY) ×3 IMPLANT
PAD ARMBOARD 7.5X6 YLW CONV (MISCELLANEOUS) ×6 IMPLANT
PENCIL SMOKE EVACUATOR (MISCELLANEOUS) ×3 IMPLANT
RELOAD PROXIMATE 75MM BLUE (ENDOMECHANICALS) ×6 IMPLANT
RETAINER VISCERA MED (MISCELLANEOUS) ×3 IMPLANT
SPONGE T-LAP 18X18 ~~LOC~~+RFID (SPONGE) ×15 IMPLANT
STAPLER GUN LINEAR PROX 60 (STAPLE) ×3 IMPLANT
STAPLER PROXIMATE 75MM BLUE (STAPLE) ×3 IMPLANT
STAPLER VISISTAT 35W (STAPLE) ×3 IMPLANT
SUT ETHILON 2 0 FS 18 (SUTURE) ×3 IMPLANT
SUT MNCRL AB 4-0 PS2 18 (SUTURE) IMPLANT
SUT NOVA NAB GS-21 0 18 T12 DT (SUTURE) ×12 IMPLANT
SUT PDS AB 0 CT 36 (SUTURE) ×12 IMPLANT
SUT SILK 2 0 SH CR/8 (SUTURE) ×3 IMPLANT
SUT SILK 3 0 SH CR/8 (SUTURE) ×6 IMPLANT
SUT VIC AB 3-0 SH 18 (SUTURE) ×3 IMPLANT
TOWEL GREEN STERILE (TOWEL DISPOSABLE) ×3 IMPLANT
TOWEL GREEN STERILE FF (TOWEL DISPOSABLE) ×3 IMPLANT
TRAY FOLEY W/BAG SLVR 16FR (SET/KITS/TRAYS/PACK) ×1
TRAY FOLEY W/BAG SLVR 16FR ST (SET/KITS/TRAYS/PACK) ×2 IMPLANT
YANKAUER SUCT BULB TIP NO VENT (SUCTIONS) ×3 IMPLANT

## 2021-02-20 NOTE — Anesthesia Preprocedure Evaluation (Addendum)
Anesthesia Evaluation  Patient identified by MRN, date of birth, ID band Patient awake    Reviewed: Allergy & Precautions, NPO status , Patient's Chart, lab work & pertinent test results, reviewed documented beta blocker date and time   Airway Mallampati: III  TM Distance: >3 FB Neck ROM: Full    Dental  (+) Teeth Intact, Caps, Dental Advisory Given,    Pulmonary sleep apnea and Continuous Positive Airway Pressure Ventilation , PE   Pulmonary exam normal breath sounds clear to auscultation       Cardiovascular hypertension, Pt. on medications and Pt. on home beta blockers + CAD, + Past MI, + Cardiac Stents, + Peripheral Vascular Disease and + DVT  Normal cardiovascular exam Rhythm:Regular Rate:Normal  01/02/21 Non STEMI with PCI and Drug Eluding Stent placement mid RCA  EKG 02/19/21 NSR, poor R wave progression with early transition, Inferior MI   Neuro/Psych negative neurological ROS  negative psych ROS   GI/Hepatic negative GI ROS, Neg liver ROS,   Endo/Other  diabetes, Poorly Controlled, Type 2, Oral Hypoglycemic Agents, Insulin DependentMorbid obesity  Renal/GU Renal disease  negative genitourinary   Musculoskeletal Incarcerated Ventral hernia   Abdominal (+) + obese,  Abdomen: tender.    Peds  Hematology Plavix therapy- last dose 02/17/21   Anesthesia Other Findings   Reproductive/Obstetrics ED                           Anesthesia Physical Anesthesia Plan  ASA: 3  Anesthesia Plan: General   Post-op Pain Management:    Induction: Intravenous, Rapid sequence and Cricoid pressure planned  PONV Risk Score and Plan: 4 or greater and Treatment may vary due to age or medical condition, Midazolam and Ondansetron  Airway Management Planned: Oral ETT  Additional Equipment: None  Intra-op Plan:   Post-operative Plan: Extubation in OR  Informed Consent: I have reviewed the patients  History and Physical, chart, labs and discussed the procedure including the risks, benefits and alternatives for the proposed anesthesia with the patient or authorized representative who has indicated his/her understanding and acceptance.     Dental advisory given  Plan Discussed with: CRNA and Anesthesiologist  Anesthesia Plan Comments:         Anesthesia Quick Evaluation

## 2021-02-20 NOTE — Op Note (Signed)
Preoperative diagnosis: recurrent incisional hernia with bowel obstruction  Postoperative diagnosis: same   Procedure:  exploratory laparotomy 2. lysis of adhesions for 60 min 3. small bowel resection 4. repair of recurrent incisional hernia with onlay mesh  Surgeon: Gurney Maxin, M.D.  Asst: Drucie Ip, M.D.  Anesthesia: general  Indications for procedure: Evan Leonard is a 60 y.o. year old male with symptoms of abdominal pain and vomiting. CT scan confirmed bowel obstruction with transition point as it entered a hernia. He was brought to the OR for exploration.Marland Kitchen  Description of procedure: The patient was brought into the operative suite. Anesthesia was administered with General endotracheal anesthesia. WHO checklist was applied. The patient was then placed in supine position. The area was prepped and draped in the usual sterile fashion.  Next a midline incision was made and cautery was used dissect down to the level of the fascia and hernia.  Pulmonary needle was bluntly dissected to ensure that we could identify the majority of the anterior sac.  Sac was entered safely and there were multiple loops of small intestine going up into the hernia.  Blunt dissection as well as sharp dissection was performed to reduce the hernia contents from the hernia back into the abdomen.  For the hernia sac was completely divided and the fascia was identified in the inferior aspect.  Hernia sac had a large lobule going into the left lower abdomen which contained the sigmoid colon.  There is an additional hernia in the right lower lateral space that could turn to small intestine.  This appeared to be the transition point in the most proximal area of the small intestine within the hernia.  The this lateral hernia was connected to the larger hernia by dividing the fascia to enable the small intestine to be dissected free of the hernia sac.  Once this was done the small intestine was run from ligament of  Treitz down to the ileocecal valve.  The area of transition was identified and had been deserosalized during dissection.  No further injuries were seen in the distal small intestine or colon.  Total lysis was 60 minutes.  Small intestine resection anastomosis was performed over the area deserosalized small intestine was approximately 30 cm in length.  75 mm GIA staplers were used to divide small intestine on each side.  LigaSure was used to take the mesentery.  A side-to-side functional end-to-end anastomosis made with 75 mm GIA stapler and the enterotomy was closed with a TA 60 stapler.  Multiple 3-0 silk stitches were placed over the enterotomy to ensure hemostasis.  1 antitension stitch was placed at the staple line.  Next the fascia was mobilized from the subcutaneous tissue with cautery and 360 degrees.  Fascial defect was triangular with a large defect going off into the right lateral space.  Interrupted 0 Novafil's were used to oppose the fascia in a superior inferior orientation over the right side.  There was 1 area at the superior aspect of the fascial defect that was closed in a transverse orientation.  The remainder was closed in the superior inferior orientation.  The repair was then reinforced with a 15 x 15 cm phasic's absorbable mesh.  0 PDS was used to suture the mesh to the fascia in interrupted fashion.  20 mL of Marcaine was infused into the subcutaneous tissue and rectus muscle.  A 19 French drain was introduced from the left lower quadrant and placed over top of the mesh and sutured in place  with 2-0 nylon.  3-0 Vicryl was used to close subcutaneous tissue.  Skin was closed with staples.  Dressing was put in place.  Patient woke from anesthesia brought to PACU in stable condition.  All counts were correct.  Findings: multiple hernias in the lower abdomen, bowel obstruction related to right lower abdominal hernia  Specimen: small intestine  Implant: 15 cm x 15 cm Phasix   Blood loss:  300 ml  Local anesthesia:  20 ml marcaine   Complications: none  Gurney Maxin, M.D. General, Bariatric, & Minimally Invasive Surgery [facility]

## 2021-02-20 NOTE — Anesthesia Procedure Notes (Addendum)
Procedure Name: Intubation Date/Time: 02/20/2021 1:25 PM Performed by: Lowella Dell, CRNA Pre-anesthesia Checklist: Patient identified, Emergency Drugs available, Suction available and Patient being monitored Patient Re-evaluated:Patient Re-evaluated prior to induction Oxygen Delivery Method: Circle System Utilized Preoxygenation: Pre-oxygenation with 100% oxygen Induction Type: IV induction, Rapid sequence and Cricoid Pressure applied Ventilation: Mask ventilation without difficulty Laryngoscope Size: Glidescope and 4 Grade View: Grade I Tube type: Oral Tube size: 7.5 mm Number of attempts: 1 Airway Equipment and Method: Rigid stylet and Video-laryngoscopy Placement Confirmation: ETT inserted through vocal cords under direct vision, positive ETCO2 and breath sounds checked- equal and bilateral Secured at: 22 cm Tube secured with: Tape Dental Injury: Teeth and Oropharynx as per pre-operative assessment  Comments: NGT placed to suction prior to induction

## 2021-02-20 NOTE — Anesthesia Postprocedure Evaluation (Signed)
Anesthesia Post Note  [name]  Procedure(s) Performed: EXPLORATORY LAPAROTOMY; RECURRENT HERNIA REPAIR WITH OVERLAY MESH (Abdomen) SMALL BOWEL RESECTION (Abdomen) LYSIS OF ADHESION (Abdomen)     Patient location during evaluation: PACU Anesthesia Type: General Level of consciousness: awake and alert and oriented Pain management: pain level controlled Vital Signs Assessment: post-procedure vital signs reviewed and stable Respiratory status: spontaneous breathing, nonlabored ventilation, respiratory function stable and patient connected to nasal cannula oxygen Cardiovascular status: blood pressure returned to baseline and stable Postop Assessment: no apparent nausea or vomiting Anesthetic complications: no   No notable events documented.  Last Vitals:  Vitals:   02/20/21 1730 02/20/21 1750  BP: 134/83 (!) 143/81  Pulse: 88 88  Resp: 13 16  Temp: (!) 36.2 C 37.1 C  SpO2: 98% 98%    Last Pain:  Vitals:   02/20/21 1750  TempSrc: Oral  PainSc: Asleep                 Annelie Boak A.

## 2021-02-20 NOTE — Progress Notes (Signed)
[facility] PROGRESS NOTE    Progress Note  Deaundre Allston Ortloff  UJW:[id number] DOB: Sep 21, 1960 DOA: 02/19/2021 PCP: [name], MD     Brief Narrative:   Baraa Tubbs Fedorchak is an 60 y.o. male past medical history significant for essential hypertension, history of DVT on Eliquis, diabetes mellitus type 2 morbid obesity presents with 2-day history of abdominal pain and nausea which is progressively getting worst.  CT scan of the abdomen and pelvis shows significant large and complex multilocular ventral hernia with incarcerated small bowel and high-grade small bowel obstruction General surgery was consulted who recommended NG tube placement to intermittent suction   Assessment/Plan:   Small bowel obstruction with incarcerated ventral hernia: Not tender on abdominal exam, has NG tube has come out 3 times. I have personally talked to the nurse to put it back in try to secure. General surgery has been consulted. Patient is n.p.o. with NG tube in place to intermittent suction. Started on normal saline try to keep potassium greater than 4 magnesium greater than 2. Minimize narcotics, started on the small bowel protocol, repeated abdominal x-ray showed contrast in small bowel diluted. Patient will likely need a surgical intervention.  Patient relating overnight d that narcotics not helping with the pain.  CAD: Status post stent to the RCA last month due to an NSTEMI. At that time 2D echo showed an EF of 55% with grade 1 diastolic heart failure.   Continue to hold Plavix and statins, patient is currently n.p.o. is probably is going to surgery is high.  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  Essential hypertension: Currently n.p.o. agree with labetalol IV as needed.  Diabetes mellitus type 2 uncontrolled: With an A1c of 9.5.  Continue sliding scale insulin CBGs  every 4.  GERD: Continue Protonix IV.  History of repair of ventral hernia  Morbid obesity: Counseling  Obstructive sleep apnea: CPAP at night.  Leukocytosis: Likely reactive due to small bowel obstruction now improved he has remained afebrile continue monitor fever curve.   DVT prophylaxis: heparin Family Communication:none Status is: Inpatient  Remains inpatient appropriate because:Hemodynamically unstable  Dispo: The patient is from: Home              Anticipated d/c is to: Home              Patient currently is not medically stable to d/c.   Difficult to place patient No    Code Status:     Code Status Orders  (From admission, onward)           Start     Ordered   02/19/21 1146  Full code  Continuous        02/19/21 1151           Code Status History     Date Active Date Inactive Code Status Order ID Comments User Context   01/01/2021 1340 01/03/2021 1819 Full Code [id number]  Orma Flaming, MD ED   08/21/2019 0954 08/23/2019 1839 Full Code [id number]  Karmen Bongo, MD ED   08/20/2018 0233 08/22/2018 1321 Full Code [id number]  Clovis Riley, MD ED   03/21/2018 1206 03/24/2018 2143 Full Code [id number]  Reubin Milan, MD ED   05/12/2017 2229 05/15/2017 1631 Full Code [id number]  Norval Morton, MD Inpatient   04/26/2017 1528 04/29/2017 1651 Full Code [id number]  Georganna Skeans, MD Inpatient  07/16/2016 0554 07/19/2016 2247 Full Code [id number]  Etta Quill, DO Inpatient   09/06/2015 2141 09/10/2015 1623 Full Code [id number]  Norval Morton, MD ED   06/16/2015 0444 06/20/2015 1534 Full Code [id number]  Toy Baker, MD Inpatient   05/17/2015 1603 05/18/2015 1715 Full Code [id number]  Velvet Bathe, MD Inpatient   12/14/2013 0010 12/22/2013 2016 Full Code [id number]  Corey Harold, NP Inpatient   12/13/2013 2307 12/14/2013 0010 Full Code [id number]  Doreen Salvage, MD Inpatient   03/23/2013 0610 03/26/2013 1757 Full Code [id number]  Toy Baker,  MD Inpatient   05/05/2012 0133 05/07/2012 2106 Full Code [id number]  Adria Dill, RN Inpatient         IV Access:   Peripheral IV   Procedures and diagnostic studies:   CT Abdomen Pelvis W Contrast  Result Date: 02/19/2021 CLINICAL DATA:  60 year old male with mid abdominal pain. Query bowel obstruction. EXAM: CT ABDOMEN AND PELVIS WITH CONTRAST TECHNIQUE: Multidetector CT imaging of the abdomen and pelvis was performed using the standard protocol following bolus administration of intravenous contrast. CONTRAST:  177mL OMNIPAQUE IOHEXOL 350 MG/ML SOLN COMPARISON:  CT Abdomen and Pelvis 08/21/2019 and earlier. FINDINGS: Lower chest: Calcified coronary artery atherosclerosis. No cardiomegaly or pericardial effusion. Chronic left lung base atelectasis. Hepatobiliary: Chronic hepatic steatosis. Tiny layering gallstones suspected. No gallbladder inflammation or bile duct enlargement. Pancreas: Atrophied. Spleen: Stable and negative. Adrenals/Urinary Tract: Normal adrenal glands. Extensive bilateral nephrolithiasis but no hydronephrosis or hydroureter. Mildly distended but otherwise unremarkable bladder. Small exophytic right renal midpole cyst has decreased in size since 2017, benign. Stomach/Bowel: Retained stool in the distal large bowel. Complex chronic ventral abdominal hernia containing both large and small bowel loops and mesentery. The proximal sigmoid and distal descending colon is herniated but not incarcerated. Upstream large bowel remains normal. There is trace free fluid in the right lower quadrant, but no discrete pericecal inflammation. No enlarged or abnormal appendix is identified. The terminal ileum is decompressed, and distal small bowel exits the large left abdominal hernia on series 3, image 84. Multiple small bowel loops appear incarcerated within the complex hernia in a multiloculated fashion (see coronal image 34). And there is an upstream small bowel obstruction, with the  proximal loop entering the hernia on coronal image 42. Upstream fluid-filled and mildly inflamed small bowel loops are individually dilated to 43 mm. The stomach and duodenum are fluid-filled but decompressed. Small-bowel dilates in the proximal to mid jejunum. No free air. Trace free fluid in the herniated mesentery. Overall the hernia sac measures about 24 mm diameter. Vascular/Lymphatic: Aortoiliac calcified atherosclerosis. Major arterial structures in the abdomen and pelvis are patent. Portal venous system is patent. No lymphadenopathy. Reproductive: Negative. Other: No pelvic free fluid. Musculoskeletal: Osteopenia. Chronic but ununited left posterior 10th rib fracture is new since last year. No acute osseous abnormality identified. IMPRESSION: 1. Large and complex, multilocular-appearing ventral abdominal hernia with incarcerated small bowel and subsequent fairly high-grade small bowel obstruction. Herniated left colon also, but not incarcerated. No free air. Small volume free fluid. 2. Chronic appearing but ununited left 10th rib fracture is new since last year. Chronic hepatic steatosis.  Extensive bilateral nephrolithiasis. Calcified coronary artery atherosclerosis.Aortic Atherosclerosis (ICD10-I70.0). Electronically Signed   By: Genevie Ann M.D.   On: 02/19/2021 09:42   DG Abd Portable 1V  Result Date: 02/20/2021 CLINICAL DATA:  Follow-up small bowel obstruction EXAM: PORTABLE ABDOMEN - 1 VIEW COMPARISON:  Yesterday FINDINGS: Continued gas dilated small bowel loops measuring up to  nearly 5 cm in diameter. Dilute contrast is still seen within dilated small bowel loops. Relatively decompressed colon. No concerning mass effect, gas collection, or calcification. IMPRESSION: Continued high-grade small bowel obstruction. Oral contrast is diluted within dilated small bowel loops. Electronically Signed   By: Jorje Guild M.D.   On: 02/20/2021 06:51   DG Abd Portable 1V-Small Bowel Obstruction  Protocol-initial, 8 hr delay  Result Date: 02/19/2021 CLINICAL DATA:  Abdominal pain. Evaluate for small bowel obstruction. No bowel movement for 2 days EXAM: PORTABLE ABDOMEN - 1 VIEW COMPARISON:  CT abdomen pelvis 02/19/2021. x-ray abdomen 02/19/2021 8:15 p.m. FINDINGS: Several loops of small bowel are dilated with gas and PO contrast. PO contrast may have reach the ascending colon. IMPRESSION: 1. Small-bowel obstruction. PO contrast may have reach the ascending colon. Recommend attention on follow-up. Electronically Signed   By: Iven Finn M.D.   On: 02/19/2021 23:22   DG Abd Portable 1V  Result Date: 02/19/2021 CLINICAL DATA:  NG tube placement EXAM: PORTABLE ABDOMEN - 1 VIEW COMPARISON:  02/19/2021 at 1942 hours FINDINGS: Enteric tube with its tip at the gastric cardia and side port in the distal esophagus, unchanged. IMPRESSION: Enteric tube with its tip at the gastric cardia and side port in the distal esophagus, unchanged. Electronically Signed   By: Julian Hy M.D.   On: 02/19/2021 20:57   DG Abd Portable 1 View  Result Date: 02/19/2021 CLINICAL DATA:  NG verification EXAM: PORTABLE ABDOMEN - 1 VIEW COMPARISON:  02/19/2021 at 1342 hours FINDINGS: Enteric tube terminates in the gastric cardia with its side port in the distal esophagus. This has migrated proximally since the prior. Contrast in the proximal stomach. IMPRESSION: Enteric tube terminates in the gastric cardia with its side port in the distal esophagus. This has migrated proximally since the prior. Consider advancement as clinically warranted. Electronically Signed   By: Julian Hy M.D.   On: 02/19/2021 20:15   DG Abd Portable 1V-Small Bowel Protocol-Position Verification  Result Date: 02/19/2021 CLINICAL DATA:  Generalized abdominal pain since yesterday.  Nausea. EXAM: PORTABLE ABDOMEN - 1 VIEW COMPARISON:  08/21/2019. FINDINGS: Nasogastric terminates in the stomach. Marked gaseous distention of small bowel. There  is some gas seen in the colon. Overall image quality is limited by body habitus. IMPRESSION: Gaseous distention of small bowel with some gas in the colon, findings indicative of an early or partial small bowel obstruction. Electronically Signed   By: Lorin Picket M.D.   On: 02/19/2021 14:21     Medical Consultants:   None.   Subjective:    Elnita Maxwell Chapa he relates his abdominal pain is improve with the IV pain medication.  His tube has come out accidentally 3 times  Objective:    Vitals:   02/20/21 0330 02/20/21 0400 02/20/21 0406 02/20/21 0500  BP:  107/72  121/73  Pulse: 88 87 88 81  Resp: 15 16 16 16   Temp:      TempSrc:      SpO2: 92% (!) 87% 93% 92%  Weight:      Height:       SpO2: 92 %   Intake/Output Summary (Last 24 hours) at 02/20/2021 0714 Last data filed at 02/20/2021 0404 Gross per 24 hour  Intake --  Output 850 ml  Net -850 ml   Filed Weights   02/19/21 1145  Weight: (!) 149.1 kg    Exam: General exam: In no acute distress. Respiratory system: Good air movement and clear to  auscultation. Cardiovascular system: S1 & S2 heard, RRR. No JVD.  Gastrointestinal system: Abdomen is nondistended, soft and nontender.  Extremities: No pedal edema. Skin: No rashes, lesions or ulcers Psychiatry: Judgement and insight appear normal. Mood & affect appropriate.    Data Reviewed:    Labs: Basic Metabolic Panel: Recent Labs  Lab 02/19/21 0733 02/20/21 0500  NA 136 139  K 3.9 4.0  CL 104 105  CO2 21* 23  GLUCOSE 173* 132*  BUN 17 19  CREATININE 1.02 1.01  CALCIUM 9.1 8.8*   GFR Estimated Creatinine Clearance: 118.4 mL/min (by C-G formula based on SCr of 1.01 mg/dL). Liver Function Tests: Recent Labs  Lab 02/19/21 0733  AST 17  ALT 20  ALKPHOS 76  BILITOT 1.0  PROT 7.3  ALBUMIN 3.2*   Recent Labs  Lab 02/19/21 0733  LIPASE 23   No results for input(s): AMMONIA in the last 168 hours. Coagulation profile Recent Labs  Lab  02/19/21 1002 02/20/21 0500  INR 1.1 1.2   COVID-19 Labs  No results for input(s): DDIMER, FERRITIN, LDH, CRP in the last 72 hours.  Lab Results  Component Value Date   SARSCOV2NAA NEGATIVE 02/19/2021   SARSCOV2NAA POSITIVE (A) 12/20/2020   SARSCOV2NAA NEGATIVE 11/18/2020   SARSCOV2NAA POSITIVE (A) 02/19/2020    CBC: Recent Labs  Lab 02/19/21 0733 02/20/21 0500  WBC 13.6* 9.4  NEUTROABS 11.1*  --   HGB 15.2 14.2  HCT 47.6 44.1  MCV 91.7 93.2  PLT 259 213   Cardiac Enzymes: No results for input(s): CKTOTAL, CKMB, CKMBINDEX, TROPONINI in the last 168 hours. BNP (last 3 results) No results for input(s): PROBNP in the last 8760 hours. CBG: Recent Labs  Lab 02/20/21 0648  GLUCAP 136*   D-Dimer: No results for input(s): DDIMER in the last 72 hours. Hgb A1c: No results for input(s): HGBA1C in the last 72 hours. Lipid Profile: No results for input(s): CHOL, HDL, LDLCALC, TRIG, CHOLHDL, LDLDIRECT in the last 72 hours. Thyroid function studies: No results for input(s): TSH, T4TOTAL, T3FREE, THYROIDAB in the last 72 hours.  Invalid input(s): FREET3 Anemia work up: No results for input(s): VITAMINB12, FOLATE, FERRITIN, TIBC, IRON, RETICCTPCT in the last 72 hours. Sepsis Labs: Recent Labs  Lab 02/19/21 0733 02/20/21 0500  WBC 13.6* 9.4   Microbiology Recent Results (from the past 240 hour(s))  Resp Panel by RT-PCR (Flu A&B, Covid) Nasopharyngeal Swab     Status: None   Collection Time: 02/19/21 10:19 AM   Specimen: Nasopharyngeal Swab; Nasopharyngeal(NP) swabs in vial transport medium  Result Value Ref Range Status   SARS Coronavirus 2 by RT PCR NEGATIVE NEGATIVE Final    Comment: (NOTE) SARS-CoV-2 target nucleic acids are NOT DETECTED.  The SARS-CoV-2 RNA is generally detectable in upper respiratory specimens during the acute phase of infection. The lowest concentration of SARS-CoV-2 viral copies this assay can detect is 138 copies/mL. A negative result does  not preclude SARS-Cov-2 infection and should not be used as the sole basis for treatment or other patient management decisions. A negative result may occur with  improper specimen collection/handling, submission of specimen other than nasopharyngeal swab, presence of viral mutation(s) within the areas targeted by this assay, and inadequate number of viral copies(<138 copies/mL). A negative result must be combined with clinical observations, patient history, and epidemiological information. The expected result is Negative.  Fact Sheet for Patients:  EntrepreneurPulse.com.au  Fact Sheet for Healthcare Providers:  IncredibleEmployment.be  This test is no t yet approved  or cleared by the [place] and  has been authorized for detection and/or diagnosis of SARS-CoV-2 by FDA under an Emergency Use Authorization (EUA). This EUA will remain  in effect (meaning this test can be used) for the duration of the COVID-19 declaration under Section 564(b)(1) of the Act, 21 U.S.C.section 360bbb-3(b)(1), unless the authorization is terminated  or revoked sooner.       Influenza A by PCR NEGATIVE NEGATIVE Final   Influenza B by PCR NEGATIVE NEGATIVE Final    Comment: (NOTE) The Xpert Xpress SARS-CoV-2/FLU/RSV plus assay is intended as an aid in the diagnosis of influenza from Nasopharyngeal swab specimens and should not be used as a sole basis for treatment. Nasal washings and aspirates are unacceptable for Xpert Xpress SARS-CoV-2/FLU/RSV testing.  Fact Sheet for Patients: EntrepreneurPulse.com.au  Fact Sheet for Healthcare Providers: IncredibleEmployment.be  This test is not yet approved or cleared by the [place] FDA and has been authorized for detection and/or diagnosis of SARS-CoV-2 by FDA under an Emergency Use Authorization (EUA). This EUA will remain in effect (meaning this test can be used) for the  duration of the COVID-19 declaration under Section 564(b)(1) of the Act, 21 U.S.C. section 360bbb-3(b)(1), unless the authorization is terminated or revoked.  Performed at [facility], [place] [address], [place], [place] [address]      Medications:    pantoprazole (PROTONIX) IV  40 mg Intravenous Q24H   sodium chloride flush  3 mL Intravenous Q12H   Continuous Infusions:  sodium chloride 75 mL/hr at 02/19/21 1451   heparin 2,150 Units/hr (02/19/21 2116)      LOS: 1 day   Charlynne Cousins  [facility]  02/20/2021, 7:14 AM

## 2021-02-20 NOTE — ED Notes (Signed)
Pt called out, stated that when he rolled over his NG tube came out, spreading the contents of it all over the bed.  Full linen change was completed and we attempted to reinsert it twice but was unsuccessful.  Pt requested we give him a minute before trying again.

## 2021-02-20 NOTE — ED Notes (Signed)
X-ray at bedside

## 2021-02-20 NOTE — ED Notes (Addendum)
This RN washed pt's face with soap & water, then wiped the area on his nose with alcohol swab & then a skin prep. Once dry, benzoin swab was used to increase adhesiveness before applying the sticker on his nose to hold his 12 Fr NG tube in the Rt nare, extra tape was applied over his nose extending onto his cheek to increase the chances if it not coming off. Pt did not tol 16 Fr & much resistance was met with insertion attempt initially, which is the reasoning for the smaller tube this time. Xray placed for tube placement verification via standard protocol. Pt was educated on how to keep the NG safe & call bell in reach for when he needs bedside assistance.

## 2021-02-20 NOTE — ED Notes (Signed)
Pt's O2 dropped to 88% while he was sleeping, RN placed 2L [place] on pt which improved pt's O2 level to 96%.  Will continue to monitor.

## 2021-02-20 NOTE — Progress Notes (Signed)
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  No Known Allergies  Patient Measurements: Height: 6\' 2"  (188 cm) Weight: (!) 149.1 kg (328 lb 11.3 oz) IBW/kg (Calculated) : 82.2 Heparin Dosing Weight: 116 kg  Vital Signs: BP: 121/73 (09/27 0500) Pulse Rate: 81 (09/27 0500)  Labs: Recent Labs    02/19/21 0733 02/19/21 1002 02/19/21 1503 02/19/21 1928 02/20/21 0400 02/20/21 0500  HGB 15.2  --   --   --   --  14.2  HCT 47.6  --   --   --   --  44.1  PLT 259  --   --   --   --  213  APTT  --   --  23* 43* 62*  --   LABPROT  --  14.1  --   --   --  15.1  INR  --  1.1  --   --   --  1.2  HEPARINUNFRC  --   --  0.44  --   --  0.84*  CREATININE 1.02  --   --   --   --   --      Estimated Creatinine Clearance: 117.3 mL/min (by C-G formula based on SCr of 1.02 mg/dL).   Medical History: Past Medical History:  Diagnosis Date   CAD (coronary artery disease)    Diabetes mellitus, type II ([place]) 02/2013   Diabetic ulcer of right foot due to type 2 diabetes mellitus ([place]) 03/21/2018   DVT (deep venous thrombosis) ([place]) 05/04/2012   LLE   Exertional dyspnea 05/04/2012   "isolated episode" (05/05/2012)   Hepatic steatosis    History of chickenpox    Hypertension 03/21/2018   Obesity, Class III, BMI 40-49.9 (morbid obesity) ([place]) 03/21/2018   OSA on CPAP    Peripheral vascular disease ([facility])    varicose veins   Pulmonary embolism ([place]) 05/04/2012   bilaterally   Pyelonephritis 04/2017   Small bowel obstruction ([place]) 09/06/2015   Varicose veins     Medications: see MAR  Assessment: 60 yo M presenting with SBO with incarcerated ventral hernia. Pt is NPO and heparin consult to continue therapeutic AC while holding PTA Eliquis. Last dose of Eliquis 9/24 PM per med rec.   Baseline heparin level falsely elevated (0.44), aPTT normal (23). Initial heparin level came back subtherapeutic at 43, on 1850  units/hr (drawn ~5 hr after infusion start). No s/sx of bleeding or infusion issues.   9/27 AM update:  aPTT low CBC stable  Goal of Therapy:  Heparin level 0.3-0.7 units/mL aPTT 66-102 secs Monitor platelets by anticoagulation protocol: Yes   Plan:  Increase heparin infusion to 2300 units/hr Re-check heparin level and aPTT in 8 hours  Narda Bonds, PharmD, BCPS Clinical Pharmacist Phone: [phone]

## 2021-02-20 NOTE — Progress Notes (Signed)
Patient to 4E26 from PACU. Vital signs obtained. On monitor CCMD notified. CHG bath completed. Patient alert and oriented to room and call light. Call bell within reach.  Era Bumpers, RN

## 2021-02-20 NOTE — Progress Notes (Signed)
Day of Surgery   Chief Complaint/Subjective: NG fell out again, pain present but improved  Review of Systems See above, otherwise other systems negative   PMH -  has a past medical history of CAD (coronary artery disease), Diabetes mellitus, type II ([place]) (02/2013), Diabetic ulcer of right foot due to type 2 diabetes mellitus ([place]) (03/21/2018), DVT (deep venous thrombosis) ([place]) (05/04/2012), Exertional dyspnea (05/04/2012), Hepatic steatosis, History of chickenpox, Hypertension (03/21/2018), Obesity, Class III, BMI 40-49.9 (morbid obesity) ([place]) (03/21/2018), OSA on CPAP, Peripheral vascular disease ([place]), Pulmonary embolism ([place]) (05/04/2012), Pyelonephritis (04/2017), Small bowel obstruction ([place]) (09/06/2015), and Varicose veins. PSH -  has a past surgical history that includes laparotomy (N/A, 12/13/2013); Hernia repair; Ventral hernia repair (N/A, 04/26/2017); Cystoscopy w/ ureteral stent placement (Right, 05/12/2017); Cystoscopy/ureteroscopy/holmium laser/stent placement (Right, 07/29/2017); LEFT HEART CATH AND CORONARY ANGIOGRAPHY (N/A, 01/02/2021); and CORONARY STENT INTERVENTION (N/A, 01/02/2021).  [facility] - family history includes Diabetes in his mother; Heart failure in his father; Hypertension in his mother; Other in his maternal grandmother.   Objective: Vital signs in last 24 hours: Pulse Rate:  [81-98] 98 (09/27 0800) Resp:  [15-22] 20 (09/27 0800) BP: (107-148)/(70-90) 140/90 (09/27 0800) SpO2:  [87 %-96 %] 94 % (09/27 0800) Weight:  [149.1 kg] 149.1 kg (09/26 1145)   Intake/Output from previous day: 09/26 0701 - 09/27 0700 In: -  Out: 850 [Urine:550] Intake/Output this shift: No intake/output data recorded.  PE: Gen: NAD Resp: nonlabored Card: RRR Abd: soft, periumbilical hernia, tender in left side, no erythema  Lab Results:  Recent Labs    02/19/21 0733 02/20/21 0500  WBC 13.6* 9.4  HGB 15.2 14.2  HCT 47.6 44.1  PLT 259 213   BMET Recent Labs    02/19/21 0733  02/20/21 0500  NA 136 139  K 3.9 4.0  CL 104 105  CO2 21* 23  GLUCOSE 173* 132*  BUN 17 19  CREATININE 1.02 1.01  CALCIUM 9.1 8.8*   PT/INR Recent Labs    02/19/21 1002 02/20/21 0500  LABPROT 14.1 15.1  INR 1.1 1.2   CMP     Component Value Date/Time   NA 139 02/20/2021 0500   K 4.0 02/20/2021 0500   CL 105 02/20/2021 0500   CO2 23 02/20/2021 0500   GLUCOSE 132 (H) 02/20/2021 0500   BUN 19 02/20/2021 0500   CREATININE 1.01 02/20/2021 0500   CREATININE 0.94 03/06/2020 1623   CALCIUM 8.8 (L) 02/20/2021 0500   PROT 7.3 02/19/2021 0733   ALBUMIN 3.2 (L) 02/19/2021 0733   AST 17 02/19/2021 0733   ALT 20 02/19/2021 0733   ALKPHOS 76 02/19/2021 0733   BILITOT 1.0 02/19/2021 0733   GFRNONAA >60 02/20/2021 0500   GFRAA >60 12/06/2019 1755   Lipase     Component Value Date/Time   LIPASE 23 02/19/2021 0733    Studies/Results: CT Abdomen Pelvis W Contrast  Result Date: 02/19/2021 CLINICAL DATA:  60 year old male with mid abdominal pain. Query bowel obstruction. EXAM: CT ABDOMEN AND PELVIS WITH CONTRAST TECHNIQUE: Multidetector CT imaging of the abdomen and pelvis was performed using the standard protocol following bolus administration of intravenous contrast. CONTRAST:  115mL OMNIPAQUE IOHEXOL 350 MG/ML SOLN COMPARISON:  CT Abdomen and Pelvis 08/21/2019 and earlier. FINDINGS: Lower chest: Calcified coronary artery atherosclerosis. No cardiomegaly or pericardial effusion. Chronic left lung base atelectasis. Hepatobiliary: Chronic hepatic steatosis. Tiny layering gallstones suspected. No gallbladder inflammation or bile duct enlargement. Pancreas: Atrophied. Spleen: Stable and negative. Adrenals/Urinary Tract: Normal adrenal glands. Extensive  bilateral nephrolithiasis but no hydronephrosis or hydroureter. Mildly distended but otherwise unremarkable bladder. Small exophytic right renal midpole cyst has decreased in size since 2017, benign. Stomach/Bowel: Retained stool in the distal  large bowel. Complex chronic ventral abdominal hernia containing both large and small bowel loops and mesentery. The proximal sigmoid and distal descending colon is herniated but not incarcerated. Upstream large bowel remains normal. There is trace free fluid in the right lower quadrant, but no discrete pericecal inflammation. No enlarged or abnormal appendix is identified. The terminal ileum is decompressed, and distal small bowel exits the large left abdominal hernia on series 3, image 84. Multiple small bowel loops appear incarcerated within the complex hernia in a multiloculated fashion (see coronal image 34). And there is an upstream small bowel obstruction, with the proximal loop entering the hernia on coronal image 42. Upstream fluid-filled and mildly inflamed small bowel loops are individually dilated to 43 mm. The stomach and duodenum are fluid-filled but decompressed. Small-bowel dilates in the proximal to mid jejunum. No free air. Trace free fluid in the herniated mesentery. Overall the hernia sac measures about 24 mm diameter. Vascular/Lymphatic: Aortoiliac calcified atherosclerosis. Major arterial structures in the abdomen and pelvis are patent. Portal venous system is patent. No lymphadenopathy. Reproductive: Negative. Other: No pelvic free fluid. Musculoskeletal: Osteopenia. Chronic but ununited left posterior 10th rib fracture is new since last year. No acute osseous abnormality identified. IMPRESSION: 1. Large and complex, multilocular-appearing ventral abdominal hernia with incarcerated small bowel and subsequent fairly high-grade small bowel obstruction. Herniated left colon also, but not incarcerated. No free air. Small volume free fluid. 2. Chronic appearing but ununited left 10th rib fracture is new since last year. Chronic hepatic steatosis.  Extensive bilateral nephrolithiasis. Calcified coronary artery atherosclerosis.Aortic Atherosclerosis (ICD10-I70.0). Electronically Signed   By: Genevie Ann  M.D.   On: 02/19/2021 09:42   DG Abd Portable 1V  Result Date: 02/20/2021 CLINICAL DATA:  Follow-up small bowel obstruction EXAM: PORTABLE ABDOMEN - 1 VIEW COMPARISON:  Yesterday FINDINGS: Continued gas dilated small bowel loops measuring up to nearly 5 cm in diameter. Dilute contrast is still seen within dilated small bowel loops. Relatively decompressed colon. No concerning mass effect, gas collection, or calcification. IMPRESSION: Continued high-grade small bowel obstruction. Oral contrast is diluted within dilated small bowel loops. Electronically Signed   By: Jorje Guild M.D.   On: 02/20/2021 06:51   DG Abd Portable 1V-Small Bowel Obstruction Protocol-initial, 8 hr delay  Result Date: 02/19/2021 CLINICAL DATA:  Abdominal pain. Evaluate for small bowel obstruction. No bowel movement for 2 days EXAM: PORTABLE ABDOMEN - 1 VIEW COMPARISON:  CT abdomen pelvis 02/19/2021. x-ray abdomen 02/19/2021 8:15 p.m. FINDINGS: Several loops of small bowel are dilated with gas and PO contrast. PO contrast may have reach the ascending colon. IMPRESSION: 1. Small-bowel obstruction. PO contrast may have reach the ascending colon. Recommend attention on follow-up. Electronically Signed   By: Iven Finn M.D.   On: 02/19/2021 23:22   DG Abd Portable 1V  Result Date: 02/19/2021 CLINICAL DATA:  NG tube placement EXAM: PORTABLE ABDOMEN - 1 VIEW COMPARISON:  02/19/2021 at 1942 hours FINDINGS: Enteric tube with its tip at the gastric cardia and side port in the distal esophagus, unchanged. IMPRESSION: Enteric tube with its tip at the gastric cardia and side port in the distal esophagus, unchanged. Electronically Signed   By: Julian Hy M.D.   On: 02/19/2021 20:57   DG Abd Portable 1 View  Result Date: 02/19/2021 CLINICAL DATA:  NG verification EXAM: PORTABLE ABDOMEN - 1 VIEW COMPARISON:  02/19/2021 at 1342 hours FINDINGS: Enteric tube terminates in the gastric cardia with its side port in the distal  esophagus. This has migrated proximally since the prior. Contrast in the proximal stomach. IMPRESSION: Enteric tube terminates in the gastric cardia with its side port in the distal esophagus. This has migrated proximally since the prior. Consider advancement as clinically warranted. Electronically Signed   By: Julian Hy M.D.   On: 02/19/2021 20:15   DG Abd Portable 1V-Small Bowel Protocol-Position Verification  Result Date: 02/19/2021 CLINICAL DATA:  Generalized abdominal pain since yesterday.  Nausea. EXAM: PORTABLE ABDOMEN - 1 VIEW COMPARISON:  08/21/2019. FINDINGS: Nasogastric terminates in the stomach. Marked gaseous distention of small bowel. There is some gas seen in the colon. Overall image quality is limited by body habitus. IMPRESSION: Gaseous distention of small bowel with some gas in the colon, findings indicative of an early or partial small bowel obstruction. Electronically Signed   By: Lorin Picket M.D.   On: 02/19/2021 14:21    Anti-infectives: Anti-infectives (From admission, onward)    None       Assessment/Plan  Recurrent Complex ventral hernia with SBO - CT with large multilocular ventral hernia containing small bowel and L colon with SBO - hernia difficult to fully palpate given body habitus, but patient is tender over periumbilical abdomen -patient requiring increased narcotics overnight -NG tube dislodged again, replace prior to surgery -plan for OR today for ex lap with hernia repair with possible mesh for recurrent SBO related to recurrent incisional hernia   CAD s/p Recent cardiac stent placement 01/02/21 - Pt has been off eliquis and plavix since Saturday PM   FEN: NPO, IVF, NGT to be placed VTE: SCDs ID: no current abx   HTN HLD Hx of DVT/PE 2013 T2DM Peripheral vascular disease OSA  Morbid obesity - BMI 42.2   LOS: 1 day   [facility] 02/20/2021, 8:44 AM Please see Amion for pager number during day hours  7:00am-4:30pm or 7:00am -11:30am on weekends

## 2021-02-20 NOTE — Transfer of Care (Signed)
Immediate Anesthesia Transfer of Care Note  [name]  Procedure(s) Performed: EXPLORATORY LAPAROTOMY; RECURRENT HERNIA REPAIR WITH OVERLAY MESH (Abdomen) SMALL BOWEL RESECTION (Abdomen) LYSIS OF ADHESION (Abdomen)  Patient Location: PACU  Anesthesia Type:General  Level of Consciousness: awake and alert   Airway & Oxygen Therapy: Patient Spontanous Breathing and Patient connected to nasal cannula oxygen  Post-op Assessment: Report given to RN and Post -op Vital signs reviewed and stable  Post vital signs: Reviewed and stable  Last Vitals:  Vitals Value Taken Time  BP 154/77 02/20/21 1629  Temp    Pulse 82 02/20/21 1634  Resp 14 02/20/21 1634  SpO2 93 % 02/20/21 1634  Vitals shown include unvalidated device data.  Last Pain:  Vitals:   02/20/21 1229  TempSrc: Oral  PainSc: 8          Complications: No notable events documented.

## 2021-02-21 ENCOUNTER — Encounter (HOSPITAL_COMMUNITY): Payer: Self-pay | Admitting: General Surgery

## 2021-02-21 DIAGNOSIS — K56609 Unspecified intestinal obstruction, unspecified as to partial versus complete obstruction: Secondary | ICD-10-CM | POA: Diagnosis not present

## 2021-02-21 LAB — CBC
HCT: 44.5 % (ref 39.0–52.0)
Hemoglobin: 14.2 g/dL (ref 13.0–17.0)
MCH: 29.5 pg (ref 26.0–34.0)
MCHC: 31.9 g/dL (ref 30.0–36.0)
MCV: 92.5 fL (ref 80.0–100.0)
Platelets: 247 10*3/uL (ref 150–400)
RBC: 4.81 MIL/uL (ref 4.22–5.81)
RDW: 14.6 % (ref 11.5–15.5)
WBC: 12.8 10*3/uL — ABNORMAL HIGH (ref 4.0–10.5)
nRBC: 0 % (ref 0.0–0.2)

## 2021-02-21 LAB — BASIC METABOLIC PANEL
Anion gap: 10 (ref 5–15)
BUN: 16 mg/dL (ref 8–23)
CO2: 22 mmol/L (ref 22–32)
Calcium: 8.4 mg/dL — ABNORMAL LOW (ref 8.9–10.3)
Chloride: 108 mmol/L (ref 98–111)
Creatinine, Ser: 1.1 mg/dL (ref 0.61–1.24)
GFR, Estimated: >60 mL/min (ref >60)
Glucose, Bld: 142 mg/dL — ABNORMAL HIGH (ref 70–99)
Potassium: 4.7 mmol/L (ref 3.5–5.1)
Sodium: 140 mmol/L (ref 135–145)

## 2021-02-21 LAB — GLUCOSE, CAPILLARY
Glucose-Capillary: 158 mg/dL — ABNORMAL HIGH (ref 70–99)
Glucose-Capillary: 116 mg/dL — ABNORMAL HIGH (ref 70–99)

## 2021-02-21 LAB — HEPARIN LEVEL (UNFRACTIONATED)
Heparin Unfractionated: 0.16 IU/mL — ABNORMAL LOW (ref 0.30–0.70)
Heparin Unfractionated: 0.46 IU/mL (ref 0.30–0.70)

## 2021-02-21 LAB — SURGICAL PCR SCREEN
MRSA, PCR: NEGATIVE
Staphylococcus aureus: NEGATIVE

## 2021-02-21 LAB — APTT: aPTT: 61 seconds — ABNORMAL HIGH (ref 24–36)

## 2021-02-21 MED ORDER — ORAL CARE MOUTH RINSE
15.0000 mL | Freq: Two times a day (BID) | OROMUCOSAL | Status: DC
  Administered 2021-02-21 – 2021-02-26 (×10): 15 mL via OROMUCOSAL

## 2021-02-21 MED ORDER — HEPARIN (PORCINE) 25000 UT/250ML-% IV SOLN
2000.0000 [IU]/h | INTRAVENOUS | Status: DC
  Administered 2021-02-21: 2300 [IU]/h via INTRAVENOUS
  Administered 2021-02-22: 2250 [IU]/h via INTRAVENOUS
  Administered 2021-02-23 – 2021-02-24 (×3): 2000 [IU]/h via INTRAVENOUS
  Filled 2021-02-21 (×6): qty 250

## 2021-02-21 MED ORDER — HYDROMORPHONE HCL 1 MG/ML IJ SOLN
1.0000 mg | INTRAMUSCULAR | Status: DC | PRN
Start: 2021-02-21 — End: 2021-02-23
  Administered 2021-02-21 – 2021-02-23 (×7): 1 mg via INTRAVENOUS
  Filled 2021-02-21 (×8): qty 1

## 2021-02-21 MED ORDER — ACETAMINOPHEN 10 MG/ML IV SOLN
1000.0000 mg | Freq: Four times a day (QID) | INTRAVENOUS | Status: AC
  Administered 2021-02-21 – 2021-02-22 (×4): 1000 mg via INTRAVENOUS
  Filled 2021-02-21 (×4): qty 100

## 2021-02-21 MED ORDER — HYDROMORPHONE HCL 1 MG/ML IJ SOLN
0.5000 mg | INTRAMUSCULAR | Status: DC | PRN
  Administered 2021-02-21 (×3): 0.5 mg via INTRAVENOUS
  Filled 2021-02-21 (×3): qty 1

## 2021-02-21 MED ORDER — METHOCARBAMOL 1000 MG/10ML IJ SOLN
500.0000 mg | Freq: Three times a day (TID) | INTRAVENOUS | Status: DC
  Administered 2021-02-21 – 2021-02-23 (×6): 500 mg via INTRAVENOUS
  Filled 2021-02-21 (×3): qty 5
  Filled 2021-02-21 (×3): qty 500
  Filled 2021-02-21 (×3): qty 5

## 2021-02-21 NOTE — Progress Notes (Signed)
ANTICOAGULATION CONSULT NOTE - Consult  Pharmacy Consult for Heparin  Indication:  hx of DVT/PE - chronic AC w/ outpatient Eliquis  No known allergies  Patient Measurements: Height: 6\' 2"  (188 cm) Weight: (!) 149.1 kg (328 lb 11.3 oz) IBW/kg (Calculated) : 82.2  Heparin Dosing Weight: 116 kg  Vital Signs: Temp: 98.3 F (36.8 C) (09/28 0751) Temp Source: Oral (09/28 0751) BP: 133/89 (09/28 0751) Pulse Rate: 100 (09/28 0751)  Labs: Recent Labs    02/19/21 0733 02/19/21 1002 02/19/21 1503 02/19/21 1928 02/20/21 0400 02/20/21 0500 02/20/21 1818 02/21/21 0112 02/21/21 0818  HGB 15.2  --   --   --   --  14.2  --   --  14.2  HCT 47.6  --   --   --   --  44.1  --   --  44.5  PLT 259  --   --   --   --  213  --   --  247  APTT  --   --    < > 43* 62*  --  28  --   --   LABPROT  --  14.1  --   --   --  15.1  --   --   --   INR  --  1.1  --   --   --  1.2  --   --   --   HEPARINUNFRC  --   --    < >  --   --  0.84* 0.24* 0.16*  --   CREATININE 1.02  --   --   --   --  1.01  --   --  1.10   < > = values in this interval not displayed.    Estimated Creatinine Clearance: 108.7 mL/min (by C-G formula based on SCr of 1.1 mg/dL).   Medications:  Scheduled:   Chlorhexidine Gluconate Cloth  6 each Topical Daily   mouth rinse  15 mL Mouth Rinse BID   pantoprazole (PROTONIX) IV  40 mg Intravenous Q24H   sodium chloride flush  3 mL Intravenous Q12H   Infusions:   sodium chloride 75 mL/hr at 02/21/21 9983   acetaminophen 1,000 mg (02/21/21 0915)   heparin     methocarbamol (ROBAXIN) IV 500 mg (02/21/21 0934)   Assessment: 60 yo M presenting with SBO with incarcerated ventral hernia. Pt is NPO and heparin consult to continue therapeutic AC while holding PTA Eliquis. Last dose of Eliquis 9/24 PM per med rec. Baseline heparin level falsely elevated (0.44), aPTT normal (23).   Heparin infusion held No s/sx of bleeding or infusion issues.    Goal of Therapy:  - Heparin level  0.3-0.7 units/ml - aPTT level 66-102 secs - Monitor platelets by anticoagulation protocol: Yes   Plan:  - Restart heparin infusion at 2300 units/hr - Check heparin level and aPTT in 6hr - Monitor daily aPTT, HL and CBC   Thank you for allowing pharmacy to be a part of this patient's care.  Ardyth Harps, PharmD Clinical Pharmacist

## 2021-02-21 NOTE — Progress Notes (Signed)
1 Day Post-Op   Chief Complaint/Subjective: Pain controlled, no nausea  Review of Systems See above, otherwise other systems negative   PMH -  has a past medical history of CAD (coronary artery disease), Diabetes mellitus, type II ([place]) (02/2013), Diabetic ulcer of right foot due to type 2 diabetes mellitus ([place]) (03/21/2018), DVT (deep venous thrombosis) ([place]) (05/04/2012), Exertional dyspnea (05/04/2012), Hepatic steatosis, History of chickenpox, Hypertension (03/21/2018), Obesity, Class III, BMI 40-49.9 (morbid obesity) ([place]) (03/21/2018), OSA on CPAP, Peripheral vascular disease ([place]), Pulmonary embolism ([place]) (05/04/2012), Pyelonephritis (04/2017), Small bowel obstruction ([place]) (09/06/2015), and Varicose veins. PSH -  has a past surgical history that includes laparotomy (N/A, 12/13/2013); Hernia repair; Ventral hernia repair (N/A, 04/26/2017); Cystoscopy w/ ureteral stent placement (Right, 05/12/2017); Cystoscopy/ureteroscopy/holmium laser/stent placement (Right, 07/29/2017); LEFT HEART CATH AND CORONARY ANGIOGRAPHY (N/A, 01/02/2021); CORONARY STENT INTERVENTION (N/A, 01/02/2021); Ventral hernia repair (N/A, 02/20/2021); Bowel resection (N/A, 02/20/2021); and Lysis of adhesion (02/20/2021).  [facility] - family history includes Diabetes in his mother; Heart failure in his father; Hypertension in his mother; Other in his maternal grandmother.   Objective: Vital signs in last 24 hours: Temp:  [97.1 F (36.2 C)-99.4 F (37.4 C)] 98.3 F (36.8 C) (09/28 0751) Pulse Rate:  [81-113] 100 (09/28 0751) Resp:  [13-25] 20 (09/28 0751) BP: (129-154)/(74-97) 133/89 (09/28 0751) SpO2:  [89 %-98 %] 96 % (09/28 0751) Last BM Date: 02/16/21 Intake/Output from previous day: 09/27 0701 - 09/28 0700 In: 5256.6 [I.V.:5156.6; IV Piggyback:100] Out: 2150 [Urine:1625; Drains:225; Blood:300] Intake/Output this shift: No intake/output data recorded.  PE: Gen: NAd Resp: nonlabored Card: RRR Abd: soft, incision c/d/I,  drain with small serosanguinous output  Lab Results:  Recent Labs    02/20/21 0500 02/21/21 0818  WBC 9.4 12.8*  HGB 14.2 14.2  HCT 44.1 44.5  PLT 213 247   BMET Recent Labs    02/20/21 0500 02/21/21 0818  NA 139 140  K 4.0 4.7  CL 105 108  CO2 23 22  GLUCOSE 132* 142*  BUN 19 16  CREATININE 1.01 1.10  CALCIUM 8.8* 8.4*   PT/INR Recent Labs    02/19/21 1002 02/20/21 0500  LABPROT 14.1 15.1  INR 1.1 1.2   CMP     Component Value Date/Time   NA 140 02/21/2021 0818   K 4.7 02/21/2021 0818   CL 108 02/21/2021 0818   CO2 22 02/21/2021 0818   GLUCOSE 142 (H) 02/21/2021 0818   BUN 16 02/21/2021 0818   CREATININE 1.10 02/21/2021 0818   CREATININE 0.94 03/06/2020 1623   CALCIUM 8.4 (L) 02/21/2021 0818   PROT 7.3 02/19/2021 0733   ALBUMIN 3.2 (L) 02/19/2021 0733   AST 17 02/19/2021 0733   ALT 20 02/19/2021 0733   ALKPHOS 76 02/19/2021 0733   BILITOT 1.0 02/19/2021 0733   GFRNONAA >60 02/21/2021 0818   GFRAA >60 12/06/2019 1755   Lipase     Component Value Date/Time   LIPASE 23 02/19/2021 0733    Studies/Results: DG Abd Portable 1V  Result Date: 02/20/2021 CLINICAL DATA:  NG tube adjustment EXAM: PORTABLE ABDOMEN - 1 VIEW COMPARISON:  02/20/2021 FINDINGS: NG tube coils in the stomach. Dilated small bowel loops compatible with small bowel obstruction. IMPRESSION: NG tube coils in the stomach. Small bowel obstruction pattern. Electronically Signed   By: Rolm Baptise M.D.   On: 02/20/2021 08:53   DG Abd Portable 1V  Result Date: 02/20/2021 CLINICAL DATA:  Follow-up small bowel obstruction EXAM: PORTABLE ABDOMEN - 1 VIEW COMPARISON:  Yesterday FINDINGS: Continued gas dilated small bowel loops measuring up to nearly 5 cm in diameter. Dilute contrast is still seen within dilated small bowel loops. Relatively decompressed colon. No concerning mass effect, gas collection, or calcification. IMPRESSION: Continued high-grade small bowel obstruction. Oral contrast is  diluted within dilated small bowel loops. Electronically Signed   By: Jorje Guild M.D.   On: 02/20/2021 06:51   DG Abd Portable 1V-Small Bowel Obstruction Protocol-initial, 8 hr delay  Result Date: 02/19/2021 CLINICAL DATA:  Abdominal pain. Evaluate for small bowel obstruction. No bowel movement for 2 days EXAM: PORTABLE ABDOMEN - 1 VIEW COMPARISON:  CT abdomen pelvis 02/19/2021. x-ray abdomen 02/19/2021 8:15 p.m. FINDINGS: Several loops of small bowel are dilated with gas and PO contrast. PO contrast may have reach the ascending colon. IMPRESSION: 1. Small-bowel obstruction. PO contrast may have reach the ascending colon. Recommend attention on follow-up. Electronically Signed   By: Iven Finn M.D.   On: 02/19/2021 23:22   DG Abd Portable 1V  Result Date: 02/19/2021 CLINICAL DATA:  NG tube placement EXAM: PORTABLE ABDOMEN - 1 VIEW COMPARISON:  02/19/2021 at 1942 hours FINDINGS: Enteric tube with its tip at the gastric cardia and side port in the distal esophagus, unchanged. IMPRESSION: Enteric tube with its tip at the gastric cardia and side port in the distal esophagus, unchanged. Electronically Signed   By: Julian Hy M.D.   On: 02/19/2021 20:57   DG Abd Portable 1 View  Result Date: 02/19/2021 CLINICAL DATA:  NG verification EXAM: PORTABLE ABDOMEN - 1 VIEW COMPARISON:  02/19/2021 at 1342 hours FINDINGS: Enteric tube terminates in the gastric cardia with its side port in the distal esophagus. This has migrated proximally since the prior. Contrast in the proximal stomach. IMPRESSION: Enteric tube terminates in the gastric cardia with its side port in the distal esophagus. This has migrated proximally since the prior. Consider advancement as clinically warranted. Electronically Signed   By: Julian Hy M.D.   On: 02/19/2021 20:15   DG Abd Portable 1V-Small Bowel Protocol-Position Verification  Result Date: 02/19/2021 CLINICAL DATA:  Generalized abdominal pain since yesterday.   Nausea. EXAM: PORTABLE ABDOMEN - 1 VIEW COMPARISON:  08/21/2019. FINDINGS: Nasogastric terminates in the stomach. Marked gaseous distention of small bowel. There is some gas seen in the colon. Overall image quality is limited by body habitus. IMPRESSION: Gaseous distention of small bowel with some gas in the colon, findings indicative of an early or partial small bowel obstruction. Electronically Signed   By: Lorin Picket M.D.   On: 02/19/2021 14:21    Anti-infectives: Anti-infectives (From admission, onward)    Start     Dose/Rate Route Frequency Ordered Stop   02/20/21 1315  ceFAZolin (ANCEF) IVPB 3g/100 mL premix        3 g 200 mL/hr over 30 Minutes Intravenous  Once 02/20/21 1300 02/20/21 1326   02/20/21 1302  ceFAZolin (ANCEF) 3-0.9 GM/100ML-% IVPB       Note to Pharmacy: Mendel Corning   : cabinet override      02/20/21 1302 02/20/21 1342   02/20/21 1252  ceFAZolin (ANCEF) 2-4 GM/100ML-% IVPB  Status:  Discontinued       Note to Pharmacy: Cameron Sprang   : cabinet override      02/20/21 1252 02/20/21 1334       Assessment/Plan  s/p Procedure(s): EXPLORATORY LAPAROTOMY; RECURRENT HERNIA REPAIR WITH OVERLAY MESH SMALL BOWEL RESECTION LYSIS OF ADHESION 02/20/2021  -ok to resume heparin today -continue NG  tube -remove foley -up to chair -pain control  FEN - NPO VTE - heparin drip for distant history of VTE ID - perioperative abx now stopped Disposition - inpatient for healing   LOS: 2 days   [facility] 02/21/2021, 10:11 AM Please see Amion for pager number during day hours 7:00am-4:30pm or 7:00am -11:30am on weekends

## 2021-02-21 NOTE — Progress Notes (Signed)
ANTICOAGULATION CONSULT NOTE  Pharmacy Consult for Heparin  Indication:  History of PE/DVT   No known allergies  Patient Measurements: Height: 6\' 2"  (188 cm) Weight: (!) 149.1 kg (328 lb 11.3 oz) IBW/kg (Calculated) : 82.2 Heparin Dosing Weight: 116 kg  Vital Signs: Temp: 98.6 F (37 C) (09/28 1559) Temp Source: Oral (09/28 1559) BP: 138/96 (09/28 1559) Pulse Rate: 105 (09/28 1559)  Labs: Recent Labs    02/19/21 0733 02/19/21 1002 02/19/21 1503 02/20/21 0400 02/20/21 0500 02/20/21 1818 02/21/21 0112 02/21/21 0818 02/21/21 1633  HGB 15.2  --   --   --  14.2  --   --  14.2  --   HCT 47.6  --   --   --  44.1  --   --  44.5  --   PLT 259  --   --   --  213  --   --  247  --   APTT  --   --    < > 62*  --  28  --   --  61*  LABPROT  --  14.1  --   --  15.1  --   --   --   --   INR  --  1.1  --   --  1.2  --   --   --   --   HEPARINUNFRC  --   --    < >  --  0.84* 0.24* 0.16*  --  0.46  CREATININE 1.02  --   --   --  1.01  --   --  1.10  --    < > = values in this interval not displayed.     Estimated Creatinine Clearance: 108.7 mL/min (by C-G formula based on SCr of 1.1 mg/dL).   Assessment: 60 yo M presenting with SBO with incarcerated ventral hernia. Pharmacy consulted for therapeutic heparin for history of PE/DVT while holding PTA Eliquis. Last dose of Eliquis 9/24 PM per med rec.   Using aPTT to guide heparin dosing until the effect of Eliquis on heparin levels wears off.  aPTT slightly below goal, heparin level falsely therapeutic.  No issue with heparin infusion nor bleeding per RN.   Goal of Therapy:  - Heparin level 0.3-0.7 units/ml - aPTT level 66-102 secs - Monitor platelets by anticoagulation protocol: Yes   Plan:  Increase heparin infusion to 2450 units/hr F/U AM labs  Shante Archambeault D. Mina Marble, PharmD, BCPS, [place] 02/21/2021, 6:16 PM

## 2021-02-21 NOTE — Progress Notes (Signed)
HOSPITAL MEDICINE OVERNIGHT EVENT NOTE    Nursing has contacted me inquiring as to when heparin should be resumed for this patient.  Chart reviewed, patient has a history of DVT and PE, both occurring in 2013 with patient being on long-term anticoagulation with Eliquis in the outpatient setting.  Patient has been on IV heparin preoperatively.  Typically per current literature heparin can be safely resumed approximately 12 hours after surgery however the exact time of resumption is typically up to surgery's discretion.  Patient arrived on the floor from surgery at approximately 6 PM on 9/27.  Therefore, heparin should be temporarily resumed over the next few hours.  Then, at start of shift in the morning either nursing or day provider can inquire with surgery as to whether heparin can be safely resumed from their standpoint.    Vernelle Emerald  MD [facility]

## 2021-02-21 NOTE — Progress Notes (Deleted)
Pt arrived to unit from  PACU, A/O x 4,  CCMD called ,pt oriented to unit,Will continue to monitor. 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

## 2021-02-21 NOTE — Progress Notes (Signed)
PROGRESS NOTE    Evan Leonard  WUJ:[id number] DOB: [birth year] DOA: 02/19/2021 PCP: Shelva Majestic, MD   Brief Narrative:  This 60 y.o. male with PMH significant for essential hypertension, history of DVT on Eliquis, diabetes mellitus type 2,  morbid obesity presented with 2-day history of abdominal pain and nausea which is progressively getting worst.  CT scan of the abdomen and pelvis shows significant large and complex multilocular ventral hernia with incarcerated small bowel and high-grade small bowel obstruction.  General surgery was consulted who recommended NG tube placement to intermittent suction. Patient underwent exploratory laparotomy,  tolerated well. POD 1.   Assessment & Plan:   Principal Problem:   SBO (small bowel obstruction) (HCC) Active Problems:   Morbid obesity with body mass index of 40.0-44.9 in adult [facility])   Obstructive sleep apnea   Leukocytosis   Diabetes mellitus with hyperglycemia (HCC)   Incarcerated ventral hernia   History of pulmonary embolism   S/P repair of recurrent ventral hernia   CAD (coronary artery disease)   Chronic anticoagulation   History of DVT (deep vein thrombosis)   Small bowel obstruction with incarcerated ventral hernia: Patient presented with abdominal pain associated with nausea and vomiting. CT A/P showed large and complex multilocular ventral hernia with incarcerated small bowel and high-grade small bowel obstruction.   General surgery consulted,  NG tube in place to intermittent suction. Adequate pain control.  NPO, IV fluids Patient underwent exploratory laparotomy, recurrent hernia repair with overlay mesh. Postoperative day 1, continue NG tube.   CAD: Status post stent to the RCA last month due to an NSTEMI. At that time 2D echo showed an EF of 55% with grade 1 diastolic heart failure.   Resume Plavix and statin.  History of PE and DVT on chronic anticoagulation: Agree with discontinuing Eliquis and start him  on IV heparin in case surgical intervention is needed. Heparin resumed.   Essential hypertension: Continue IV labetalol as needed. Resume home blood pressure medications.   Diabetes mellitus type 2 uncontrolled: Hemoglobin A1c 9.5.   Continue sliding scale insulin CBGs every 4.   GERD: Continue Protonix IV.  History of repair of ventral hernia: S/p exploratory laparotomy.   Morbid obesity: Counseled on diet and exercise.   Obstructive sleep apnea: CPAP at night.  Leukocytosis: Likely reactive due to small bowel obstruction now improved.  He remained afebrile, continue monitor fever curve.    DVT prophylaxis:  Heparin Code Status: Full code. Family Communication: No family at bed side. Disposition Plan:   Status is: Inpatient  Remains inpatient appropriate because:Inpatient level of care appropriate due to severity of illness  Dispo: The patient is from: Home              Anticipated d/c is to: SNF              Patient currently is not medically stable to d/c.   Difficult to place patient No   Consultants:  General surgery   Procedures: Exploratory laparotomy   antimicrobials:   Anti-infectives (From admission, onward)    Start     Dose/Rate Route Frequency Ordered Stop   02/20/21 1315  ceFAZolin (ANCEF) IVPB 3g/100 mL premix        3 g 200 mL/hr over 30 Minutes Intravenous  Once 02/20/21 1300 02/20/21 1326   02/20/21 1302  ceFAZolin (ANCEF) 3-0.9 GM/100ML-% IVPB       Note to Pharmacy: Marijo Sanes   : cabinet override  02/20/21 1302 02/20/21 1342   02/20/21 1252  ceFAZolin (ANCEF) 2-4 GM/100ML-% IVPB  Status:  Discontinued       Note to Pharmacy: Shanda Bumps   : cabinet override      02/20/21 1252 02/20/21 1334       Subjective: Patient was seen and examined at bedside.  Overnight events noted.   Patient reports feeling much improved.  Still has NG tube. S/p exploratory laparotomy postoperative day 1  Objective: Vitals:   02/19/21 1145  02/21/21 0400 02/21/21 0751 02/21/21 1559  BP:  (!) 146/84 133/89 (!) 138/96  Pulse:  96 100 (!) 105  Resp:  17 20 20   Temp:  98.2 F (36.8 C) 98.3 F (36.8 C) 98.6 F (37 C)  TempSrc:  Oral Oral Oral  SpO2:  95% 96% 95%  Weight: (!) 149.1 kg     Height: 6\' 2"  (1.88 m)       Intake/Output Summary (Last 24 hours) at 02/21/2021 1625 Last data filed at 02/21/2021 1019 Gross per 24 hour  Intake 4156.64 ml  Output 1600 ml  Net 2556.64 ml   Filed Weights   02/19/21 1145  Weight: (!) 149.1 kg    Examination:  General exam: Appears comfortable, not in any acute distress.  NG tube noted Respiratory system: Clear to auscultation bilaterally, respiratory effort normal. Cardiovascular system: S1-S2 heard, regular rate and rhythm, no murmur. Gastrointestinal system: Abdomen is soft, distended, tender, midline surgical scar noted, BS- Central nervous system: Alert and oriented  X 3. No focal neurological deficits. Extremities: No edema, no cyanosis, no clubbing. Skin: No rashes, lesions or ulcers Psychiatry: Judgement and insight appear normal. Mood & affect appropriate.     Data Reviewed: I have personally reviewed following labs and imaging studies  CBC: Recent Labs  Lab 02/19/21 0733 02/20/21 0500 02/21/21 0818  WBC 13.6* 9.4 12.8*  NEUTROABS 11.1*  --   --   HGB 15.2 14.2 14.2  HCT 47.6 44.1 44.5  MCV 91.7 93.2 92.5  PLT 259 213 247   Basic Metabolic Panel: Recent Labs  Lab 02/19/21 0733 02/20/21 0500 02/21/21 0818  NA 136 139 140  K 3.9 4.0 4.7  CL 104 105 108  CO2 21* 23 22  GLUCOSE 173* 132* 142*  BUN 17 19 16   CREATININE 1.02 1.01 1.10  CALCIUM 9.1 8.8* 8.4*   GFR: Estimated Creatinine Clearance: 108.7 mL/min (by C-G formula based on SCr of 1.1 mg/dL). Liver Function Tests: Recent Labs  Lab 02/19/21 0733  AST 17  ALT 20  ALKPHOS 76  BILITOT 1.0  PROT 7.3  ALBUMIN 3.2*   Recent Labs  Lab 02/19/21 0733  LIPASE 23   No results for input(s):  AMMONIA in the last 168 hours. Coagulation Profile: Recent Labs  Lab 02/19/21 1002 02/20/21 0500  INR 1.1 1.2   Cardiac Enzymes: No results for input(s): CKTOTAL, CKMB, CKMBINDEX, TROPONINI in the last 168 hours. BNP (last 3 results) No results for input(s): PROBNP in the last 8760 hours. HbA1C: No results for input(s): HGBA1C in the last 72 hours. CBG: Recent Labs  Lab 02/20/21 0648 02/20/21 1230 02/20/21 1629 02/21/21 0616 02/21/21 1214  GLUCAP 136* 118* 132* 158* 116*   Lipid Profile: No results for input(s): CHOL, HDL, LDLCALC, TRIG, CHOLHDL, LDLDIRECT in the last 72 hours. Thyroid Function Tests: No results for input(s): TSH, T4TOTAL, FREET4, T3FREE, THYROIDAB in the last 72 hours. Anemia Panel: No results for input(s): VITAMINB12, FOLATE, FERRITIN, TIBC, IRON, RETICCTPCT in  the last 72 hours. Sepsis Labs: No results for input(s): PROCALCITON, LATICACIDVEN in the last 168 hours.  Recent Results (from the past 240 hour(s))  Resp Panel by RT-PCR (Flu A&B, Covid) Nasopharyngeal Swab     Status: None   Collection Time: 02/19/21 10:19 AM   Specimen: Nasopharyngeal Swab; Nasopharyngeal(NP) swabs in vial transport medium  Result Value Ref Range Status   SARS Coronavirus 2 by RT PCR NEGATIVE NEGATIVE Final    Comment: (NOTE) SARS-CoV-2 target nucleic acids are NOT DETECTED.  The SARS-CoV-2 RNA is generally detectable in upper respiratory specimens during the acute phase of infection. The lowest concentration of SARS-CoV-2 viral copies this assay can detect is 138 copies/mL. A negative result does not preclude SARS-Cov-2 infection and should not be used as the sole basis for treatment or other patient management decisions. A negative result may occur with  improper specimen collection/handling, submission of specimen other than nasopharyngeal swab, presence of viral mutation(s) within the areas targeted by this assay, and inadequate number of viral copies(<138  copies/mL). A negative result must be combined with clinical observations, patient history, and epidemiological information. The expected result is Negative.  Fact Sheet for Patients:  BloggerCourse.com  Fact Sheet for Healthcare Providers:  SeriousBroker.it  This test is no t yet approved or cleared by the [place] FDA and  has been authorized for detection and/or diagnosis of SARS-CoV-2 by FDA under an Emergency Use Authorization (EUA). This EUA will remain  in effect (meaning this test can be used) for the duration of the COVID-19 declaration under Section 564(b)(1) of the Act, 21 U.S.C.section 360bbb-3(b)(1), unless the authorization is terminated  or revoked sooner.       Influenza A by PCR NEGATIVE NEGATIVE Final   Influenza B by PCR NEGATIVE NEGATIVE Final    Comment: (NOTE) The Xpert Xpress SARS-CoV-2/FLU/RSV plus assay is intended as an aid in the diagnosis of influenza from Nasopharyngeal swab specimens and should not be used as a sole basis for treatment. Nasal washings and aspirates are unacceptable for Xpert Xpress SARS-CoV-2/FLU/RSV testing.  Fact Sheet for Patients: BloggerCourse.com  Fact Sheet for Healthcare Providers: SeriousBroker.it  This test is not yet approved or cleared by the [place] FDA and has been authorized for detection and/or diagnosis of SARS-CoV-2 by FDA under an Emergency Use Authorization (EUA). This EUA will remain in effect (meaning this test can be used) for the duration of the COVID-19 declaration under Section 564(b)(1) of the Act, 21 U.S.C. section 360bbb-3(b)(1), unless the authorization is terminated or revoked.  Performed at [facility], [address] [place], [place] [address]   Surgical PCR screen     Status: None   Collection Time: 02/20/21 11:58 PM   Specimen: Nasal Mucosa; Nasal Swab  Result Value Ref  Range Status   MRSA, PCR NEGATIVE NEGATIVE Final   Staphylococcus aureus NEGATIVE NEGATIVE Final    Comment: (NOTE) The Xpert SA Assay (FDA approved for NASAL specimens in patients 50 years of age and older), is one component of a comprehensive surveillance program. It is not intended to diagnose infection nor to guide or monitor treatment. Performed at [facility], [address] [place], [place] [address]     Radiology Studies: DG Abd Portable 1V  Result Date: 02/20/2021 CLINICAL DATA:  NG tube adjustment EXAM: PORTABLE ABDOMEN - 1 VIEW COMPARISON:  02/20/2021 FINDINGS: NG tube coils in the stomach. Dilated small bowel loops compatible with small bowel obstruction. IMPRESSION: NG tube coils  in the stomach. Small bowel obstruction pattern. Electronically Signed   By: Charlett Nose M.D.   On: 02/20/2021 08:53   DG Abd Portable 1V  Result Date: 02/20/2021 CLINICAL DATA:  Follow-up small bowel obstruction EXAM: PORTABLE ABDOMEN - 1 VIEW COMPARISON:  Yesterday FINDINGS: Continued gas dilated small bowel loops measuring up to nearly 5 cm in diameter. Dilute contrast is still seen within dilated small bowel loops. Relatively decompressed colon. No concerning mass effect, gas collection, or calcification. IMPRESSION: Continued high-grade small bowel obstruction. Oral contrast is diluted within dilated small bowel loops. Electronically Signed   By: Tiburcio Pea M.D.   On: 02/20/2021 06:51   DG Abd Portable 1V-Small Bowel Obstruction Protocol-initial, 8 hr delay  Result Date: 02/19/2021 CLINICAL DATA:  Abdominal pain. Evaluate for small bowel obstruction. No bowel movement for 2 days EXAM: PORTABLE ABDOMEN - 1 VIEW COMPARISON:  CT abdomen pelvis 02/19/2021. x-ray abdomen 02/19/2021 8:15 p.m. FINDINGS: Several loops of small bowel are dilated with gas and PO contrast. PO contrast may have reach the ascending colon. IMPRESSION: 1. Small-bowel obstruction. PO contrast may have reach the  ascending colon. Recommend attention on follow-up. Electronically Signed   By: Tish Frederickson M.D.   On: 02/19/2021 23:22   DG Abd Portable 1V  Result Date: 02/19/2021 CLINICAL DATA:  NG tube placement EXAM: PORTABLE ABDOMEN - 1 VIEW COMPARISON:  02/19/2021 at 1942 hours FINDINGS: Enteric tube with its tip at the gastric cardia and side port in the distal esophagus, unchanged. IMPRESSION: Enteric tube with its tip at the gastric cardia and side port in the distal esophagus, unchanged. Electronically Signed   By: Charline Bills M.D.   On: 02/19/2021 20:57   DG Abd Portable 1 View  Result Date: 02/19/2021 CLINICAL DATA:  NG verification EXAM: PORTABLE ABDOMEN - 1 VIEW COMPARISON:  02/19/2021 at 1342 hours FINDINGS: Enteric tube terminates in the gastric cardia with its side port in the distal esophagus. This has migrated proximally since the prior. Contrast in the proximal stomach. IMPRESSION: Enteric tube terminates in the gastric cardia with its side port in the distal esophagus. This has migrated proximally since the prior. Consider advancement as clinically warranted. Electronically Signed   By: Charline Bills M.D.   On: 02/19/2021 20:15    Scheduled Meds:  Chlorhexidine Gluconate Cloth  6 each Topical Daily   mouth rinse  15 mL Mouth Rinse BID   pantoprazole (PROTONIX) IV  40 mg Intravenous Q24H   sodium chloride flush  3 mL Intravenous Q12H   Continuous Infusions:  sodium chloride 75 mL/hr at 02/21/21 0635   acetaminophen 1,000 mg (02/21/21 1306)   heparin 2,300 Units/hr (02/21/21 1308)   methocarbamol (ROBAXIN) IV 500 mg (02/21/21 0934)     LOS: 2 days    Time spent: 35 mins    Remo Kirschenmann, MD [facility]   If 7PM-7AM, please contact night-coverage

## 2021-02-22 DIAGNOSIS — K56609 Unspecified intestinal obstruction, unspecified as to partial versus complete obstruction: Secondary | ICD-10-CM | POA: Diagnosis not present

## 2021-02-22 LAB — APTT
aPTT: 120 seconds — ABNORMAL HIGH (ref 24–36)
aPTT: 120 seconds — ABNORMAL HIGH (ref 24–36)

## 2021-02-22 LAB — CBC
HCT: 42 % (ref 39.0–52.0)
Hemoglobin: 13 g/dL (ref 13.0–17.0)
MCH: 28.9 pg (ref 26.0–34.0)
MCHC: 31 g/dL (ref 30.0–36.0)
MCV: 93.3 fL (ref 80.0–100.0)
Platelets: 233 10*3/uL (ref 150–400)
RBC: 4.5 MIL/uL (ref 4.22–5.81)
RDW: 14.6 % (ref 11.5–15.5)
WBC: 15.3 10*3/uL — ABNORMAL HIGH (ref 4.0–10.5)
nRBC: 0 % (ref 0.0–0.2)

## 2021-02-22 LAB — HEPARIN LEVEL (UNFRACTIONATED)
Heparin Unfractionated: 0.96 IU/mL — ABNORMAL HIGH (ref 0.30–0.70)
Heparin Unfractionated: 0.93 IU/mL — ABNORMAL HIGH (ref 0.30–0.70)
Heparin Unfractionated: 0.55 IU/mL (ref 0.30–0.70)

## 2021-02-22 LAB — GLUCOSE, CAPILLARY
Glucose-Capillary: 127 mg/dL — ABNORMAL HIGH (ref 70–99)
Glucose-Capillary: 121 mg/dL — ABNORMAL HIGH (ref 70–99)
Glucose-Capillary: 121 mg/dL — ABNORMAL HIGH (ref 70–99)
Glucose-Capillary: 122 mg/dL — ABNORMAL HIGH (ref 70–99)

## 2021-02-22 LAB — SURGICAL PATHOLOGY

## 2021-02-22 MED ORDER — METOPROLOL TARTRATE 5 MG/5ML IV SOLN
5.0000 mg | Freq: Four times a day (QID) | INTRAVENOUS | Status: DC | PRN
  Administered 2021-02-22: 5 mg via INTRAVENOUS
  Filled 2021-02-22: qty 5

## 2021-02-22 NOTE — Progress Notes (Signed)
ANTICOAGULATION CONSULT NOTE - Consult  Pharmacy Consult for Heparin  Indication:  hx of DVT/PE - chronic AC w/ outpatient Eliquis  No known allergies  Patient Measurements: Height: 6\' 2"  (188 cm) Weight: (!) 149.1 kg (328 lb 11.3 oz) IBW/kg (Calculated) : 82.2  Heparin Dosing Weight: 116 kg  Vital Signs: Temp: 98 F (36.7 C) (09/29 2002) Temp Source: Oral (09/29 2002) BP: 128/85 (09/29 2002) Pulse Rate: 100 (09/29 2002)  Labs: Recent Labs    02/20/21 0500 02/20/21 1818 02/21/21 0818 02/21/21 1633 02/22/21 0059 02/22/21 0943 02/22/21 1929  HGB 14.2  --  14.2  --  13.0  --   --   HCT 44.1  --  44.5  --  42.0  --   --   PLT 213  --  247  --  233  --   --   APTT  --    < >  --  61* 120* 120*  --   LABPROT 15.1  --   --   --   --   --   --   INR 1.2  --   --   --   --   --   --   HEPARINUNFRC 0.84*   < >  --  0.46 0.96* 0.93* 0.55  CREATININE 1.01  --  1.10  --   --   --   --    < > = values in this interval not displayed.    Estimated Creatinine Clearance: 108.7 mL/min (by C-G formula based on SCr of 1.1 mg/dL).   Medications:  Scheduled:   Chlorhexidine Gluconate Cloth  6 each Topical Daily   mouth rinse  15 mL Mouth Rinse BID   pantoprazole (PROTONIX) IV  40 mg Intravenous Q24H   sodium chloride flush  3 mL Intravenous Q12H   Infusions:   sodium chloride 75 mL/hr at 02/22/21 1308   heparin 2,000 Units/hr (02/22/21 1400)   methocarbamol (ROBAXIN) IV 500 mg (02/22/21 1813)   Assessment: 60 yo M presenting with SBO with incarcerated ventral hernia. Pt is NPO and heparin consult to continue therapeutic AC while holding PTA Eliquis. Last dose of Eliquis 9/24 PM per med rec.   Heparin level at goal at 0.55 after holding for 1 hour and decreasing rate. No bleeding noted and previous level appears to have been drawn appropriately. No issue with heparin infusion nor bleeding per RN.    Goal of Therapy:  - Heparin level 0.3-0.7 units/ml - Monitor platelets by  anticoagulation protocol: Yes   Plan:  - Continue heparin at decreased rate of 2000 units/hr  - Monitor daily heparin levels and CBC   Thank you for allowing pharmacy to be a part of this patient's care. Sloan Leiter, PharmD, BCPS, BCCCP Clinical Pharmacist Please refer to [facility] for [place] numbers 02/22/2021, 8:26 PM

## 2021-02-22 NOTE — Progress Notes (Signed)
ANTICOAGULATION CONSULT NOTE - Consult  Pharmacy Consult for Heparin  Indication:  hx of DVT/PE - chronic AC w/ outpatient Eliquis  No known allergies  Patient Measurements: Height: 6\' 2"  (188 cm) Weight: (!) 149.1 kg (328 lb 11.3 oz) IBW/kg (Calculated) : 82.2  Heparin Dosing Weight: 116 kg  Vital Signs: Temp: 98.2 F (36.8 C) (09/29 1135) Temp Source: Oral (09/29 1135) BP: 158/81 (09/29 1135) Pulse Rate: 103 (09/29 1135)  Labs: Recent Labs    02/20/21 0500 02/20/21 1818 02/21/21 0818 02/21/21 1633 02/22/21 0059 02/22/21 0943  HGB 14.2  --  14.2  --  13.0  --   HCT 44.1  --  44.5  --  42.0  --   PLT 213  --  247  --  233  --   APTT  --    < >  --  61* 120* 120*  LABPROT 15.1  --   --   --   --   --   INR 1.2  --   --   --   --   --   HEPARINUNFRC 0.84*   < >  --  0.46 0.96* 0.93*  CREATININE 1.01  --  1.10  --   --   --    < > = values in this interval not displayed.    Estimated Creatinine Clearance: 108.7 mL/min (by C-G formula based on SCr of 1.1 mg/dL).   Medications:  Scheduled:   Chlorhexidine Gluconate Cloth  6 each Topical Daily   mouth rinse  15 mL Mouth Rinse BID   pantoprazole (PROTONIX) IV  40 mg Intravenous Q24H   sodium chloride flush  3 mL Intravenous Q12H   Infusions:   sodium chloride 75 mL/hr at 02/22/21 0016   heparin 2,250 Units/hr (02/22/21 1005)   methocarbamol (ROBAXIN) IV 500 mg (02/22/21 0946)   Assessment: 60 yo M presenting with SBO with incarcerated ventral hernia. Pt is NPO and heparin consult to continue therapeutic AC while holding PTA Eliquis. Last dose of Eliquis 9/24 PM per med rec. Baseline heparin level falsely elevated (0.44), aPTT normal (23).   aPTT used to guide heparin dosing until the effect of Eliquis on heparin levels wears off. Appears that levels may be correlating. No bleeding noted and previous level appears to have been drawn appropriately. Confirmed labs were drawn on opposite arm than heparin IV. No issue  with heparin infusion nor bleeding per RN.    Goal of Therapy:  - Heparin level 0.3-0.7 units/ml - Monitor platelets by anticoagulation protocol: Yes   Plan:  - Hold heparin for 1 hour - Resume heparin at decreased rate of 2000 units/hr - Check heparin level in 6 hours - Monitor daily heparin levels and CBC   Thank you for allowing pharmacy to be a part of this patient's care.  Ardyth Harps, PharmD Clinical Pharmacist

## 2021-02-22 NOTE — Progress Notes (Signed)
PROGRESS NOTE    Evan Leonard  HYW:[id number] DOB: Dec 22, 1960 DOA: 02/19/2021 PCP: Shelva Majestic, MD   Brief Narrative:  This 60 y.o. male with PMH significant for essential hypertension, history of DVT on Eliquis, diabetes mellitus type 2,  morbid obesity presented with 2-day history of abdominal pain and nausea which is progressively getting worst.  CT scan of the abdomen and pelvis shows significant large and complex multilocular ventral hernia with incarcerated small bowel and high-grade small bowel obstruction.  General surgery was consulted who recommended NG tube placement to intermittent suction. Patient underwent exploratory laparotomy,  tolerated well. POD 2.  Assessment & Plan:   Principal Problem:   SBO (small bowel obstruction) (HCC) Active Problems:   Morbid obesity with body mass index of 40.0-44.9 in adult [facility])   Obstructive sleep apnea   Leukocytosis   Diabetes mellitus with hyperglycemia (HCC)   Incarcerated ventral hernia   History of pulmonary embolism   S/P repair of recurrent ventral hernia   CAD (coronary artery disease)   Chronic anticoagulation   History of DVT (deep vein thrombosis)   Small bowel obstruction with incarcerated ventral hernia: Patient presented with abdominal pain associated with nausea and vomiting. CT A/P showed large and complex multilocular ventral hernia with incarcerated small bowel and high-grade small bowel obstruction.   General surgery consulted,  NG tube in place to intermittent suction. Adequate pain control.  NPO, IV fluids Patient underwent exploratory laparotomy, recurrent hernia repair with overlay mesh.  Small bowel resection and lysis of adhesion Postoperative day 2, clamp NG tube, start clear liquid diet.   CAD: Status post stent to the RCA last month due to an NSTEMI. At that time 2D echo showed an EF of 55% with grade 1 diastolic heart failure.   Resume Plavix and statin.  History of PE and DVT on chronic  anticoagulation: Agree with discontinuing Eliquis and start him on IV heparin in case surgical intervention is needed. Heparin resumed.   Essential hypertension: Continue IV labetalol as needed. Resume home blood pressure medications.   Diabetes mellitus type 2 uncontrolled: Hemoglobin A1c 9.5.   Continue sliding scale insulin. CBGs every 4.   GERD: Continue Protonix IV.  History of repair of ventral hernia: S/p exploratory laparotomy.   Morbid obesity: Counseled on diet and exercise.   Obstructive sleep apnea: CPAP at night.  Leukocytosis: Likely reactive due to small bowel obstruction now improved.  He remained afebrile, continue monitor fever curve.    DVT prophylaxis:  Heparin Code Status: Full code. Family Communication: No family at bed side. Disposition Plan:   Status is: Inpatient  Remains inpatient appropriate because:Inpatient level of care appropriate due to severity of illness  Dispo: The patient is from: Home              Anticipated d/c is to: SNF              Patient currently is not medically stable to d/c.   Difficult to place patient No   Consultants:  General surgery   Procedures: Exploratory laparotomy   antimicrobials:   Anti-infectives (From admission, onward)    Start     Dose/Rate Route Frequency Ordered Stop   02/20/21 1315  ceFAZolin (ANCEF) IVPB 3g/100 mL premix        3 g 200 mL/hr over 30 Minutes Intravenous  Once 02/20/21 1300 02/20/21 1326   02/20/21 1302  ceFAZolin (ANCEF) 3-0.9 GM/100ML-% IVPB       Note to Pharmacy:  Marijo Sanes   : cabinet override      02/20/21 1302 02/20/21 1342   02/20/21 1252  ceFAZolin (ANCEF) 2-4 GM/100ML-% IVPB  Status:  Discontinued       Note to Pharmacy: Shanda Bumps   : cabinet override      02/20/21 1252 02/20/21 1334       Subjective: Patient was seen and examined at bedside.  Overnight events noted.   Patient reports feeling much improved.  He was seen lying in the recliner. S/p  exploratory laparotomy postoperative day 2.   Objective: Vitals:   02/22/21 0016 02/22/21 0447 02/22/21 0744 02/22/21 1135  BP: 133/90 131/81 (!) 148/95 (!) 158/81  Pulse: 100 90 (!) 101 (!) 103  Resp:  17 19 18   Temp:  97.7 F (36.5 C) 98.1 F (36.7 C) 98.2 F (36.8 C)  TempSrc:  Oral Oral Oral  SpO2: 93% 97% 98% 97%  Weight:      Height:        Intake/Output Summary (Last 24 hours) at 02/22/2021 1433 Last data filed at 02/22/2021 0749 Gross per 24 hour  Intake 1757.5 ml  Output 1865 ml  Net -107.5 ml   Filed Weights   02/19/21 1145  Weight: (!) 149.1 kg    Examination:  General exam: Appears comfortable, not in any acute distress, NG tube noted. Respiratory system: Clear to auscultation bilaterally, respiratory effort normal, RR 15 Cardiovascular system: S1-S2 heard, regular rate and rhythm, no murmur. Gastrointestinal system: Abdomen is soft, distended, tender, midline surgical scar noted,  BS- Central nervous system: Alert and oriented  X 3. No focal neurological deficits. Extremities: No edema, no cyanosis, no clubbing. Skin: No rashes, lesions or ulcers Psychiatry: Judgement and insight appear normal. Mood & affect appropriate.     Data Reviewed: I have personally reviewed following labs and imaging studies  CBC: Recent Labs  Lab 02/19/21 0733 02/20/21 0500 02/21/21 0818 02/22/21 0059  WBC 13.6* 9.4 12.8* 15.3*  NEUTROABS 11.1*  --   --   --   HGB 15.2 14.2 14.2 13.0  HCT 47.6 44.1 44.5 42.0  MCV 91.7 93.2 92.5 93.3  PLT 259 213 247 233   Basic Metabolic Panel: Recent Labs  Lab 02/19/21 0733 02/20/21 0500 02/21/21 0818  NA 136 139 140  K 3.9 4.0 4.7  CL 104 105 108  CO2 21* 23 22  GLUCOSE 173* 132* 142*  BUN 17 19 16   CREATININE 1.02 1.01 1.10  CALCIUM 9.1 8.8* 8.4*   GFR: Estimated Creatinine Clearance: 108.7 mL/min (by C-G formula based on SCr of 1.1 mg/dL). Liver Function Tests: Recent Labs  Lab 02/19/21 0733  AST 17  ALT 20   ALKPHOS 76  BILITOT 1.0  PROT 7.3  ALBUMIN 3.2*   Recent Labs  Lab 02/19/21 0733  LIPASE 23   No results for input(s): AMMONIA in the last 168 hours. Coagulation Profile: Recent Labs  Lab 02/19/21 1002 02/20/21 0500  INR 1.1 1.2   Cardiac Enzymes: No results for input(s): CKTOTAL, CKMB, CKMBINDEX, TROPONINI in the last 168 hours. BNP (last 3 results) No results for input(s): PROBNP in the last 8760 hours. HbA1C: No results for input(s): HGBA1C in the last 72 hours. CBG: Recent Labs  Lab 02/21/21 1214 02/21/21 1555 02/22/21 0622 02/22/21 1133 02/22/21 1354  GLUCAP 116* 127* 121* 121* 122*   Lipid Profile: No results for input(s): CHOL, HDL, LDLCALC, TRIG, CHOLHDL, LDLDIRECT in the last 72 hours. Thyroid Function Tests: No results for  input(s): TSH, T4TOTAL, FREET4, T3FREE, THYROIDAB in the last 72 hours. Anemia Panel: No results for input(s): VITAMINB12, FOLATE, FERRITIN, TIBC, IRON, RETICCTPCT in the last 72 hours. Sepsis Labs: No results for input(s): PROCALCITON, LATICACIDVEN in the last 168 hours.  Recent Results (from the past 240 hour(s))  Resp Panel by RT-PCR (Flu A&B, Covid) Nasopharyngeal Swab     Status: None   Collection Time: 02/19/21 10:19 AM   Specimen: Nasopharyngeal Swab; Nasopharyngeal(NP) swabs in vial transport medium  Result Value Ref Range Status   SARS Coronavirus 2 by RT PCR NEGATIVE NEGATIVE Final    Comment: (NOTE) SARS-CoV-2 target nucleic acids are NOT DETECTED.  The SARS-CoV-2 RNA is generally detectable in upper respiratory specimens during the acute phase of infection. The lowest concentration of SARS-CoV-2 viral copies this assay can detect is 138 copies/mL. A negative result does not preclude SARS-Cov-2 infection and should not be used as the sole basis for treatment or other patient management decisions. A negative result may occur with  improper specimen collection/handling, submission of specimen other than nasopharyngeal  swab, presence of viral mutation(s) within the areas targeted by this assay, and inadequate number of viral copies(<138 copies/mL). A negative result must be combined with clinical observations, patient history, and epidemiological information. The expected result is Negative.  Fact Sheet for Patients:  BloggerCourse.com  Fact Sheet for Healthcare Providers:  SeriousBroker.it  This test is no t yet approved or cleared by the [place] FDA and  has been authorized for detection and/or diagnosis of SARS-CoV-2 by FDA under an Emergency Use Authorization (EUA). This EUA will remain  in effect (meaning this test can be used) for the duration of the COVID-19 declaration under Section 564(b)(1) of the Act, 21 U.S.C.section 360bbb-3(b)(1), unless the authorization is terminated  or revoked sooner.       Influenza A by PCR NEGATIVE NEGATIVE Final   Influenza B by PCR NEGATIVE NEGATIVE Final    Comment: (NOTE) The Xpert Xpress SARS-CoV-2/FLU/RSV plus assay is intended as an aid in the diagnosis of influenza from Nasopharyngeal swab specimens and should not be used as a sole basis for treatment. Nasal washings and aspirates are unacceptable for Xpert Xpress SARS-CoV-2/FLU/RSV testing.  Fact Sheet for Patients: BloggerCourse.com  Fact Sheet for Healthcare Providers: SeriousBroker.it  This test is not yet approved or cleared by the [place] FDA and has been authorized for detection and/or diagnosis of SARS-CoV-2 by FDA under an Emergency Use Authorization (EUA). This EUA will remain in effect (meaning this test can be used) for the duration of the COVID-19 declaration under Section 564(b)(1) of the Act, 21 U.S.C. section 360bbb-3(b)(1), unless the authorization is terminated or revoked.  Performed at [facility], [address], [place], [place] [address]   Surgical PCR  screen     Status: None   Collection Time: 02/20/21 11:58 PM   Specimen: Nasal Mucosa; Nasal Swab  Result Value Ref Range Status   MRSA, PCR NEGATIVE NEGATIVE Final   Staphylococcus aureus NEGATIVE NEGATIVE Final    Comment: (NOTE) The Xpert SA Assay (FDA approved for NASAL specimens in patients 20 years of age and older), is one component of a comprehensive surveillance program. It is not intended to diagnose infection nor to guide or monitor treatment. Performed at [facility], [address] [place], [place] [address]     Radiology Studies: No results found.  Scheduled Meds:  Chlorhexidine Gluconate Cloth  6 each Topical Daily   mouth rinse  15 mL Mouth Rinse BID   pantoprazole (PROTONIX) IV  40 mg Intravenous Q24H   sodium chloride flush  3 mL Intravenous Q12H   Continuous Infusions:  sodium chloride 75 mL/hr at 02/22/21 1308   heparin 2,000 Units/hr (02/22/21 1400)   methocarbamol (ROBAXIN) IV 500 mg (02/22/21 0946)     LOS: 3 days    Time spent: 25 mins    Shizue Kaseman, MD [facility]   If 7PM-7AM, please contact night-coverage

## 2021-02-22 NOTE — Evaluation (Signed)
Physical Therapy Evaluation Patient Details Name: Evan Leonard MRN: [id number] DOB: 10/30/1960 Today's Date: 02/22/2021  History of Present Illness  Pt is a 60 y.o. male admitted 02/19/21 with abdominal pain, nausea. CT showed complex ventral hernia with incarcerated small bowel and high-grade SBO. S/p ex-lap, hernia repair, small bowel resection on 9/27. PMH includes CAD, DM2, R foot ulcer, DVT/PE, HTN, PVD, SBO, OSA on CPAP, obesity.   Clinical Impression  Pt presents with an overall decrease in functional mobility secondary to above. PTA, pt independent, works, drives, active in community. Educ on abdominal precautions for comfort, positioning, and importance of mobility. Today, pt able to transfer and ambulate with RW and supervision for safety. Pt would benefit from continued acute PT services to maximize functional mobility and independence prior to d/c home.   HR up to 140s with ambulation Post-mobility BP 157/98     Recommendations for follow up therapy are one component of a multi-disciplinary discharge planning process, led by the attending physician.  Recommendations may be updated based on patient status, additional functional criteria and insurance authorization.  Follow Up Recommendations No PT follow up    Equipment Recommendations  None recommended by PT    Recommendations for Other Services       Precautions / Restrictions Precautions Precautions: Fall;Other (comment) Precaution Comments: NGT (clamped), abdominal JP drain, abdominal binder Restrictions Weight Bearing Restrictions: No      Mobility  Bed Mobility               General bed mobility comments: Received standing at sink with OT    Transfers Overall transfer level: Needs assistance Equipment used: Rolling walker (2 wheeled) Transfers: Sit to/from Stand Sit to Stand: Supervision            Ambulation/Gait Ambulation/Gait assistance: Supervision Gait Distance (Feet): 520  Feet Assistive device: Rolling walker (2 wheeled) Gait Pattern/deviations: Step-through pattern;Decreased stride length;Trunk flexed Gait velocity: Decreased   General Gait Details: Slow, steady gait with RW and supervision for safety/lines; HR up to 140s with ambulation, DOE 2-3/4; 2x brief standing rest breaks  Stairs            Wheelchair Mobility    Modified Rankin (Stroke Patients Only)       Balance Overall balance assessment: Needs assistance Sitting-balance support: No upper extremity supported;Feet supported Sitting balance-Leahy Scale: Good     Standing balance support: Bilateral upper extremity supported;During functional activity;No upper extremity supported Standing balance-Leahy Scale: Fair Standing balance comment: Can static stand without UE support at sink                             Pertinent Vitals/Pain Pain Assessment: Faces Faces Pain Scale: Hurts little more Pain Location: abdomen Pain Descriptors / Indicators: Grimacing;Guarding Pain Intervention(s): Monitored during session;Premedicated before session    Home Living Family/patient expects to be discharged to:: Private residence Living Arrangements: Alone Available Help at Discharge: Family;Friend(s);Neighbor;Available PRN/intermittently Type of Home: Apartment Home Access: Level entry     Home Layout: One level Home Equipment: Bedside commode;Hand held shower head;Walker - 2 wheels Additional Comments: Multiple supportive family/friends/neighbors available to help if needed    Prior Function Level of Independence: Independent         Comments: Independent without DME, drives, works at [employer], active in [place]: Right    Extremity/Trunk Assessment   Upper Extremity Assessment Upper Extremity Assessment:  Overall WFL for tasks assessed    Lower Extremity Assessment Lower Extremity Assessment: Overall WFL for tasks  assessed       Communication   Communication: No difficulties  Cognition Arousal/Alertness: Awake/alert Behavior During Therapy: WFL for tasks assessed/performed Overall Cognitive Status: Within Functional Limits for tasks assessed                                        General Comments General comments (skin integrity, edema, etc.): HR up to 140s with ambulation; post-ambulation BP 157/98    Exercises     Assessment/Plan    PT Assessment Patient needs continued PT services  PT Problem List Decreased activity tolerance;Decreased balance;Decreased mobility;Decreased knowledge of precautions;Cardiopulmonary status limiting activity;Obesity;Pain       PT Treatment Interventions DME instruction;Gait training;Functional mobility training;Therapeutic activities;Therapeutic exercise;Balance training;Patient/family education    PT Goals (Current goals can be found in the Care Plan section)  Acute Rehab PT Goals Patient Stated Goal: go to [place] game in November PT Goal Formulation: With patient Time For Goal Achievement: 03/08/21 Potential to Achieve Goals: Good    Frequency Min 3X/week   Barriers to discharge        Co-evaluation               AM-PAC PT "6 Clicks" Mobility  Outcome Measure Help needed turning from your back to your side while in a flat bed without using bedrails?: A Little Help needed moving from lying on your back to sitting on the side of a flat bed without using bedrails?: A Little Help needed moving to and from a bed to a chair (including a wheelchair)?: A Little Help needed standing up from a chair using your arms (e.g., wheelchair or bedside chair)?: A Little Help needed to walk in hospital room?: A Little Help needed climbing 3-5 steps with a railing? : A Little 6 Click Score: 18    End of Session   Activity Tolerance: Patient tolerated treatment well Patient left: in chair;with call bell/phone within reach Nurse  Communication: Mobility status PT Visit Diagnosis: Other abnormalities of gait and mobility (R26.89);Pain    Time: 9179-1505 PT Time Calculation (min) (ACUTE ONLY): 18 min   Charges:   PT Evaluation $PT Eval Low Complexity: [place], PT, DPT Acute Rehabilitation Services  Pager [phone] Office [place] 02/22/2021, 3:19 PM

## 2021-02-22 NOTE — Evaluation (Signed)
Occupational Therapy Evaluation Patient Details Name: Evan Leonard MRN: [id number] DOB: Oct 11, 1960 Today's Date: 02/22/2021   History of Present Illness Pt is a 60 y.o. male admitted 02/19/21 with abdominal pain, nausea. CT showed complex ventral hernia with incarcerated small bowel and high-grade SBO. S/p ex-lap, hernia repair, small bowel resection on 9/27. PMH includes CAD, DM2, R foot ulcer, DVT/PE, HTN, PVD, SBO, OSA on CPAP, obesity.   Clinical Impression   PTA, pt lives alone in first floor apartment and reports Independence in all daily tasks without use of AD. Pt presents now with minor deficits in strength, balance, endurance and pain. Pt able to mobilize in room with RW at Supervision level and perform ADLs standing at sink > 7 minutes. Pt Independent for UB ADLs and Min A at most for LB ADLs (L LE and abdominal discomfort limitations). Encouraged energy conservation of prioritizing and pacing daily tasks. Anticipate quick return to PLOF. Will continue to follow acutely to progress ADL/mobility independence but anticipate no OT needs at DC.       Recommendations for follow up therapy are one component of a multi-disciplinary discharge planning process, led by the attending physician.  Recommendations may be updated based on patient status, additional functional criteria and insurance authorization.   Follow Up Recommendations  No OT follow up    Equipment Recommendations  None recommended by OT    Recommendations for Other Services       Precautions / Restrictions Precautions Precautions: Fall;Other (comment) Precaution Comments: NG suction tube, JP drain, abdominal binder Restrictions Weight Bearing Restrictions: No      Mobility Bed Mobility               General bed mobility comments: received up in chair    Transfers Overall transfer level: Needs assistance Equipment used: Rolling walker (2 wheeled) Transfers: Sit to/from Stand Sit to Stand:  Supervision         General transfer comment: Supervision for sit to stand using RW, increased time/effort but able to problem solve best hand placement without cues    Balance Overall balance assessment: Needs assistance Sitting-balance support: No upper extremity supported;Feet supported Sitting balance-Leahy Scale: Good     Standing balance support: Bilateral upper extremity supported;During functional activity Standing balance-Leahy Scale: Fair Standing balance comment: fair static standing at sink, BUE support helpful for endurance                           ADL either performed or assessed with clinical judgement   ADL Overall ADL's : Needs assistance/impaired Eating/Feeding: Independent;Sitting   Grooming: Set up;Standing;Oral care;Wash/dry face;Brushing hair Grooming Details (indicate cue type and reason): able to complete tasks standing > 71min Upper Body Bathing: Independent;Sitting   Lower Body Bathing: Min guard;Sit to/from stand   Upper Body Dressing : Independent;Sitting   Lower Body Dressing: Minimal assistance;Sit to/from stand Lower Body Dressing Details (indicate cue type and reason): able to cross LEs to reach top of socks, increased effort needed for L LE due to discomfort Toilet Transfer: Supervision/safety;Ambulation;RW   Toileting- Clothing Manipulation and Hygiene: Supervision/safety;Sit to/from stand       Functional mobility during ADLs: Supervision/safety;Rolling walker General ADL Comments: Pt limited by abdominal pain and decreased endurance but overall motivated to participate and able to complete tasks with limited assist. Discussed energy conservation strategies with prioritizing tasks and spacing out activities. As pt able to stand for grooming tasks extended period, likely able to stand  for showering tasks for same timeframe     Vision Baseline Vision/History: 1 Wears glasses Ability to See in Adequate Light: 0 Adequate Patient  Visual Report: No change from baseline Vision Assessment?: No apparent visual deficits     Perception     Praxis      Pertinent Vitals/Pain Pain Assessment: Faces Faces Pain Scale: Hurts little more Pain Location: abdomen Pain Descriptors / Indicators: Grimacing;Guarding Pain Intervention(s): Monitored during session;Premedicated before session     Hand Dominance Right   Extremity/Trunk Assessment Upper Extremity Assessment Upper Extremity Assessment: Overall WFL for tasks assessed   Lower Extremity Assessment Lower Extremity Assessment: Defer to PT evaluation   Cervical / Trunk Assessment Cervical / Trunk Assessment: Normal   Communication Communication Communication: No difficulties   Cognition Arousal/Alertness: Awake/alert Behavior During Therapy: WFL for tasks assessed/performed Overall Cognitive Status: Within Functional Limits for tasks assessed                                 General Comments: very pleasant and participatory   General Comments  BP WFL pre activity, HR up to 135 with prolonged standing during ADLs    Exercises     Shoulder Instructions      Home Living Family/patient expects to be discharged to:: Private residence Living Arrangements: Alone Available Help at Discharge: Family;Available PRN/intermittently Type of Home: Apartment Home Access: Level entry     Home Layout: One level     Bathroom Shower/Tub: Occupational psychologist: Handicapped height (BSC over toilet)     Home Equipment: Bedside commode;Hand held shower head;Walker - 2 wheels          Prior Functioning/Environment Level of Independence: Independent        Comments: no use of AD typicallly for mobility, able to complete ADLs (stands for showers)        OT Problem List: Decreased strength;Decreased activity tolerance;Impaired balance (sitting and/or standing);Pain      OT Treatment/Interventions: Therapeutic exercise;Self-care/ADL  training;Energy conservation;DME and/or AE instruction;Therapeutic activities;Patient/family education;Balance training    OT Goals(Current goals can be found in the care plan section) Acute Rehab OT Goals Patient Stated Goal: go to [place] game in November OT Goal Formulation: With patient Time For Goal Achievement: 03/08/21 Potential to Achieve Goals: Good  OT Frequency: Min 2X/week   Barriers to D/C:            Co-evaluation              AM-PAC OT "6 Clicks" Daily Activity     Outcome Measure Help from another person eating meals?: None Help from another person taking care of personal grooming?: A Little Help from another person toileting, which includes using toliet, bedpan, or urinal?: A Little Help from another person bathing (including washing, rinsing, drying)?: A Little Help from another person to put on and taking off regular upper body clothing?: None Help from another person to put on and taking off regular lower body clothing?: A Little 6 Click Score: 20   End of Session Equipment Utilized During Treatment: Rolling walker Nurse Communication: Mobility status  Activity Tolerance: Patient tolerated treatment well Patient left: Other (comment) (to ambulate in hallway with PT)  OT Visit Diagnosis: Other abnormalities of gait and mobility (R26.89);Muscle weakness (generalized) (M62.81);Pain Pain - part of body:  (abdomen)                Time: 6440-3474  OT Time Calculation (min): 32 min Charges:  OT General Charges $OT Visit: 1 Visit OT Evaluation $OT Eval Moderate Complexity: 1 Mod OT Treatments $Self Care/Home Management : 8-22 mins  Malachy Chamber, OTR/L Acute Rehab Services Office: [phone]   Layla Maw 02/22/2021, 12:14 PM

## 2021-02-22 NOTE — Progress Notes (Signed)
ANTICOAGULATION CONSULT NOTE  Pharmacy Consult for Heparin  Indication:  History of PE/DVT   No known allergies  Patient Measurements: Height: 6\' 2"  (188 cm) Weight: (!) 149.1 kg (328 lb 11.3 oz) IBW/kg (Calculated) : 82.2 Heparin Dosing Weight: 116 kg  Vital Signs: Temp: 97.7 F (36.5 C) (09/28 2039) Temp Source: Oral (09/28 2039) BP: 133/90 (09/29 0016) Pulse Rate: 100 (09/29 0016)  Labs: Recent Labs    02/19/21 0733 02/19/21 1002 02/19/21 1503 02/20/21 0500 02/20/21 1818 02/21/21 0112 02/21/21 0818 02/21/21 1633 02/22/21 0059  HGB 15.2  --   --  14.2  --   --  14.2  --  13.0  HCT 47.6  --   --  44.1  --   --  44.5  --  42.0  PLT 259  --   --  213  --   --  247  --  233  APTT  --   --    < >  --  28  --   --  61* 120*  LABPROT  --  14.1  --  15.1  --   --   --   --   --   INR  --  1.1  --  1.2  --   --   --   --   --   HEPARINUNFRC  --   --    < > 0.84* 0.24* 0.16*  --  0.46 0.96*  CREATININE 1.02  --   --  1.01  --   --  1.10  --   --    < > = values in this interval not displayed.     Estimated Creatinine Clearance: 108.7 mL/min (by C-G formula based on SCr of 1.1 mg/dL).   Assessment: 60 yo M presenting with SBO with incarcerated ventral hernia. Pharmacy consulted for therapeutic heparin for history of PE/DVT while holding PTA Eliquis. Last dose of Eliquis 9/24 PM per med rec.   Heparin level 0.97, aPTT 120 sec (both supratherapeutic). Appears that levels may be correlating. No bleeding noted and level appears to be drawn appropriately.   Goal of Therapy:  Heparin level 0.3-0.7 units/ml aPTT level 66-102 sec Monitor platelets by anticoagulation protocol: Yes   Plan:  Decrease heparin infusion to 2250 units/hr F/u 6 hr aPTT and heparin level  Sherlon Handing, PharmD, BCPS Please see amion for complete clinical pharmacist phone list 02/22/2021, 3:15 AM

## 2021-02-22 NOTE — Progress Notes (Signed)
Per MD order clamped NG tube. Patient aware he will be on clear liquid diet.  Assisted patient to recliner. Pt resting with call bell within reach.  Will continue to monitor.

## 2021-02-22 NOTE — Progress Notes (Signed)
2 Days Post-Op   Chief Complaint/Subjective: Pain controlled, flatus 2  times yesterday  Review of Systems See above, otherwise other systems negative   PMH -  has a past medical history of CAD (coronary artery disease), Diabetes mellitus, type II ([place]) (02/2013), Diabetic ulcer of right foot due to type 2 diabetes mellitus ([place]) (03/21/2018), DVT (deep venous thrombosis) ([place]) (05/04/2012), Exertional dyspnea (05/04/2012), Hepatic steatosis, History of chickenpox, Hypertension (03/21/2018), Obesity, Class III, BMI 40-49.9 (morbid obesity) ([place]) (03/21/2018), OSA on CPAP, Peripheral vascular disease ([place]), Pulmonary embolism ([place]) (05/04/2012), Pyelonephritis (04/2017), Small bowel obstruction ([place]) (09/06/2015), and Varicose veins. PSH -  has a past surgical history that includes laparotomy (N/A, 12/13/2013); Hernia repair; Ventral hernia repair (N/A, 04/26/2017); Cystoscopy w/ ureteral stent placement (Right, 05/12/2017); Cystoscopy/ureteroscopy/holmium laser/stent placement (Right, 07/29/2017); LEFT HEART CATH AND CORONARY ANGIOGRAPHY (N/A, 01/02/2021); CORONARY STENT INTERVENTION (N/A, 01/02/2021); Ventral hernia repair (N/A, 02/20/2021); Bowel resection (N/A, 02/20/2021); and Lysis of adhesion (02/20/2021).  [facility] - family history includes Diabetes in his mother; Heart failure in his father; Hypertension in his mother; Other in his maternal grandmother.   Objective: Vital signs in last 24 hours: Temp:  [97.7 F (36.5 C)-98.6 F (37 C)] 98.1 F (36.7 C) (09/29 0744) Pulse Rate:  [90-105] 101 (09/29 0744) Resp:  [17-20] 19 (09/29 0744) BP: (131-148)/(81-96) 148/95 (09/29 0744) SpO2:  [91 %-98 %] 98 % (09/29 0744) Last BM Date: 02/16/21 Intake/Output from previous day: 09/28 0701 - 09/29 0700 In: 1757.5 [I.V.:1457.5; IV Piggyback:300] Out: 8315 [Urine:1200; Emesis/NG output:180; Drains:200] Intake/Output this shift: Total I/O In: -  Out: 335 [Urine:300; Drains:35]  PE: Gen: NAd Resp:  nonlabored Card: RRR Abd: soft, nontender, drain with serosanguinous output  Lab Results:  Recent Labs    02/21/21 0818 02/22/21 0059  WBC 12.8* 15.3*  HGB 14.2 13.0  HCT 44.5 42.0  PLT 247 233   BMET Recent Labs    02/20/21 0500 02/21/21 0818  NA 139 140  K 4.0 4.7  CL 105 108  CO2 23 22  GLUCOSE 132* 142*  BUN 19 16  CREATININE 1.01 1.10  CALCIUM 8.8* 8.4*   PT/INR Recent Labs    02/19/21 1002 02/20/21 0500  LABPROT 14.1 15.1  INR 1.1 1.2   CMP     Component Value Date/Time   NA 140 02/21/2021 0818   K 4.7 02/21/2021 0818   CL 108 02/21/2021 0818   CO2 22 02/21/2021 0818   GLUCOSE 142 (H) 02/21/2021 0818   BUN 16 02/21/2021 0818   CREATININE 1.10 02/21/2021 0818   CREATININE 0.94 03/06/2020 1623   CALCIUM 8.4 (L) 02/21/2021 0818   PROT 7.3 02/19/2021 0733   ALBUMIN 3.2 (L) 02/19/2021 0733   AST 17 02/19/2021 0733   ALT 20 02/19/2021 0733   ALKPHOS 76 02/19/2021 0733   BILITOT 1.0 02/19/2021 0733   GFRNONAA >60 02/21/2021 0818   GFRAA >60 12/06/2019 1755   Lipase     Component Value Date/Time   LIPASE 23 02/19/2021 0733    Studies/Results: No results found.  Anti-infectives: Anti-infectives (From admission, onward)    Start     Dose/Rate Route Frequency Ordered Stop   02/20/21 1315  ceFAZolin (ANCEF) IVPB 3g/100 mL premix        3 g 200 mL/hr over 30 Minutes Intravenous  Once 02/20/21 1300 02/20/21 1326   02/20/21 1302  ceFAZolin (ANCEF) 3-0.9 GM/100ML-% IVPB       Note to Pharmacy: Mendel Corning   : cabinet override  02/20/21 1302 02/20/21 1342   02/20/21 1252  ceFAZolin (ANCEF) 2-4 GM/100ML-% IVPB  Status:  Discontinued       Note to Pharmacy: Cameron Sprang   : cabinet override      02/20/21 1252 02/20/21 1334       Assessment/Plan  s/p Procedure(s): EXPLORATORY LAPAROTOMY; RECURRENT HERNIA REPAIR WITH OVERLAY MESH SMALL BOWEL RESECTION LYSIS OF ADHESION 02/20/2021  -clamp ng tube -clear liquids -WBC decreasing -Hgb  stable after restarting heparin drip  FEN - clamp ng VTE - heparin drip restarted ID - none Disposition - inpatient for healing   LOS: 3 days   [facility] 02/22/2021, 8:54 AM Please see Amion for pager number during day hours 7:00am-4:30pm or 7:00am -11:30am on weekends

## 2021-02-23 DIAGNOSIS — K56609 Unspecified intestinal obstruction, unspecified as to partial versus complete obstruction: Secondary | ICD-10-CM | POA: Diagnosis not present

## 2021-02-23 LAB — CBC
HCT: 37.2 % — ABNORMAL LOW (ref 39.0–52.0)
Hemoglobin: 11.6 g/dL — ABNORMAL LOW (ref 13.0–17.0)
MCH: 28.8 pg (ref 26.0–34.0)
MCHC: 31.2 g/dL (ref 30.0–36.0)
MCV: 92.3 fL (ref 80.0–100.0)
Platelets: 206 10*3/uL (ref 150–400)
RBC: 4.03 MIL/uL — ABNORMAL LOW (ref 4.22–5.81)
RDW: 14.4 % (ref 11.5–15.5)
WBC: 15.2 10*3/uL — ABNORMAL HIGH (ref 4.0–10.5)
nRBC: 0 % (ref 0.0–0.2)

## 2021-02-23 LAB — GLUCOSE, CAPILLARY: Glucose-Capillary: 134 mg/dL — ABNORMAL HIGH (ref 70–99)

## 2021-02-23 LAB — BASIC METABOLIC PANEL
Anion gap: 8 (ref 5–15)
BUN: 11 mg/dL (ref 8–23)
CO2: 23 mmol/L (ref 22–32)
Calcium: 8.3 mg/dL — ABNORMAL LOW (ref 8.9–10.3)
Chloride: 106 mmol/L (ref 98–111)
Creatinine, Ser: 0.92 mg/dL (ref 0.61–1.24)
GFR, Estimated: >60 mL/min (ref >60)
Glucose, Bld: 132 mg/dL — ABNORMAL HIGH (ref 70–99)
Potassium: 3.5 mmol/L (ref 3.5–5.1)
Sodium: 137 mmol/L (ref 135–145)

## 2021-02-23 LAB — HEPARIN LEVEL (UNFRACTIONATED): Heparin Unfractionated: 0.49 IU/mL (ref 0.30–0.70)

## 2021-02-23 MED ORDER — POTASSIUM CHLORIDE CRYS ER 20 MEQ PO TBCR
40.0000 meq | EXTENDED_RELEASE_TABLET | Freq: Two times a day (BID) | ORAL | Status: AC
  Administered 2021-02-23 (×2): 40 meq via ORAL
  Filled 2021-02-23 (×2): qty 2

## 2021-02-23 MED ORDER — HYDROMORPHONE HCL 1 MG/ML IJ SOLN
0.5000 mg | INTRAMUSCULAR | Status: DC | PRN
Start: 2021-02-23 — End: 2021-02-24
  Administered 2021-02-23 (×2): 0.5 mg via INTRAVENOUS
  Filled 2021-02-23 (×2): qty 1

## 2021-02-23 MED ORDER — METHOCARBAMOL 500 MG PO TABS
500.0000 mg | ORAL_TABLET | Freq: Three times a day (TID) | ORAL | Status: DC
  Administered 2021-02-23 – 2021-02-26 (×10): 500 mg via ORAL
  Filled 2021-02-23 (×10): qty 1

## 2021-02-23 MED ORDER — ACETAMINOPHEN 325 MG PO TABS: 650.0000 mg | ORAL_TABLET | Freq: Four times a day (QID) | ORAL | Status: DC

## 2021-02-23 MED ORDER — OXYCODONE HCL 5 MG PO TABS: 5.0000 mg | ORAL_TABLET | ORAL | Status: DC | PRN

## 2021-02-23 MED ORDER — ACETAMINOPHEN 650 MG RE SUPP: 650.0000 mg | Freq: Four times a day (QID) | RECTAL | Status: DC

## 2021-02-23 NOTE — Progress Notes (Signed)
Progress Note  3 Days Post-Op  Subjective: Patient is tolerating CLD without any nausea and passing flatus. He is belching some but not frequently. He reports pain control has been good but has taken mostly IV pain meds at this point. He is working on mobilizing this AM.   Objective: Vital signs in last 24 hours: Temp:  [97.9 F (36.6 C)-98.4 F (36.9 C)] 97.9 F (36.6 C) (09/30 0830) Pulse Rate:  [75-103] 76 (09/30 0830) Resp:  [17-20] 18 (09/30 0830) BP: (115-158)/(78-86) 140/82 (09/30 0830) SpO2:  [97 %-99 %] 98 % (09/30 0830) Last BM Date: 02/16/21  Intake/Output from previous day: 09/29 0701 - 09/30 0700 In: 2472.6 [I.V.:2227.2; IV Piggyback:245.5] Out: 57 [Urine:300; Drains:255] Intake/Output this shift: Total I/O In: -  Out: 19 [Drains:40]  PE: General: pleasant, WD, morbidly obese male who is laying in bed in NAD Heart: regular, rate, and rhythm.  Lungs:  Respiratory effort nonlabored Abd: soft, appropriately ttp, honeycomb dressing to surgical incision, drain with SS fluid MS: mild BLE edema with skin changes secondary to PVD Skin: warm and dry with no masses, lesions, or rashes Neuro: Cranial nerves 2-12 grossly intact, sensation is normal throughout Psych: A&Ox3 with an appropriate affect.   Lab Results:  Recent Labs    02/22/21 0059 02/23/21 0102  WBC 15.3* 15.2*  HGB 13.0 11.6*  HCT 42.0 37.2*  PLT 233 206   BMET Recent Labs    02/21/21 0818 02/23/21 0102  NA 140 137  K 4.7 3.5  CL 108 106  CO2 22 23  GLUCOSE 142* 132*  BUN 16 11  CREATININE 1.10 0.92  CALCIUM 8.4* 8.3*   PT/INR No results for input(s): LABPROT, INR in the last 72 hours. CMP     Component Value Date/Time   NA 137 02/23/2021 0102   K 3.5 02/23/2021 0102   CL 106 02/23/2021 0102   CO2 23 02/23/2021 0102   GLUCOSE 132 (H) 02/23/2021 0102   BUN 11 02/23/2021 0102   CREATININE 0.92 02/23/2021 0102   CREATININE 0.94 03/06/2020 1623   CALCIUM 8.3 (L) 02/23/2021  0102   PROT 7.3 02/19/2021 0733   ALBUMIN 3.2 (L) 02/19/2021 0733   AST 17 02/19/2021 0733   ALT 20 02/19/2021 0733   ALKPHOS 76 02/19/2021 0733   BILITOT 1.0 02/19/2021 0733   GFRNONAA >60 02/23/2021 0102   GFRAA >60 12/06/2019 1755   Lipase     Component Value Date/Time   LIPASE 23 02/19/2021 0733       Studies/Results: No results found.  Anti-infectives: Anti-infectives (From admission, onward)    Start     Dose/Rate Route Frequency Ordered Stop   02/20/21 1315  ceFAZolin (ANCEF) IVPB 3g/100 mL premix        3 g 200 mL/hr over 30 Minutes Intravenous  Once 02/20/21 1300 02/20/21 1326   02/20/21 1302  ceFAZolin (ANCEF) 3-0.9 GM/100ML-% IVPB       Note to Pharmacy: Mendel Corning   : cabinet override      02/20/21 1302 02/20/21 1342   02/20/21 1252  ceFAZolin (ANCEF) 2-4 GM/100ML-% IVPB  Status:  Discontinued       Note to Pharmacy: Cameron Sprang   : cabinet override      02/20/21 1252 02/20/21 1334        Assessment/Plan POD3 S/P exploratory laparotomy, recurrent hernia repair with mesh, small bowel resection 9/27 Dr. Kieth Brightly - tolerating CLD and passing flatus - advance to FLD - continue to mobilize -  start transitioning to PO pain control - drain with 255 cc of SS fluid out in last 24h - continue drain for now - ok to shower from a surgical perspective   FEN: FLD, decreased IVF to 50 cc/h; replaced K with PO for goal of 4.0  VTE: heparin gtt, can likely resume eliquis tomorrow if tolerating FLD ID: Ancef periop  CAD s/p Recent cardiac stent placement 01/02/21 - cards following, on heparin gtt currently HTN HLD Hx of DVT/PE 2013 T2DM Peripheral vascular disease OSA  Morbid obesity - BMI 42.2  LOS: 4 days    Norm Parcel, [facility] 02/23/2021, 9:05 AM Please see Amion for pager number during day hours 7:00am-4:30pm

## 2021-02-23 NOTE — Discharge Instructions (Signed)
[facility]      [facility] °[phone] ° °OPEN ABDOMINAL SURGERY: POST OP INSTRUCTIONS ° °Always review your discharge instruction sheet given to you by the facility where your surgery was performed. ° °IF YOU HAVE DISABILITY OR FAMILY LEAVE FORMS, YOU MUST BRING THEM TO THE OFFICE FOR PROCESSING.  PLEASE DO NOT GIVE THEM TO YOUR DOCTOR. ° °A prescription for pain medication may be given to you upon discharge.  Take your pain medication as prescribed, if needed.  If narcotic pain medicine is not needed, then you may take acetaminophen (Tylenol) or ibuprofen (Advil) as needed. °Take your usually prescribed medications unless otherwise directed. °If you need a refill on your pain medication, please contact your pharmacy. They will contact our office to request authorization.  Prescriptions will not be filled after 5pm or on week-ends. °You should follow a light diet the first few days after arrival home, such as soup and crackers, pudding, etc.unless your doctor has advised otherwise. A high-fiber, low fat diet can be resumed as tolerated.   Be sure to include lots of fluids daily. Most patients will experience some swelling and bruising on the chest and neck area.  Ice packs will help.  Swelling and bruising can take several days to resolve °Most patients will experience some swelling and bruising in the area of the incision. Ice pack will help. Swelling and bruising can take several days to resolve..  °It is common to experience some constipation if taking pain medication after surgery.  Increasing fluid intake and taking a stool softener will usually help or prevent this problem from occurring.  A mild laxative (Milk of Magnesia or Miralax) should be taken according to package directions if there are no bowel movements after 48 hours. ° You may have steri-strips (small skin tapes) in place directly over the incision.  These strips should be left on the skin for 7-10 days.  If your surgeon used skin  glue on the incision, you may shower in 24 hours.  The glue will flake off over the next 2-3 weeks.  Any sutures or staples will be removed at the office during your follow-up visit. You may find that a light gauze bandage over your incision may keep your staples from being rubbed or pulled. You may shower and replace the bandage daily. °ACTIVITIES:  You may resume regular (light) daily activities beginning the next day--such as daily self-care, walking, climbing stairs--gradually increasing activities as tolerated.  You may have sexual intercourse when it is comfortable.  Refrain from any heavy lifting or straining until approved by your doctor. °You may drive when you no longer are taking prescription pain medication, you can comfortably wear a seatbelt, and you can safely maneuver your car and apply brakes ° °You should see your doctor in the office for a follow-up appointment approximately two weeks after your surgery.  Make sure that you call for this appointment within a day or two after you arrive home to insure a convenient appointment time. ° °WHEN TO CALL YOUR DOCTOR: °Fever over 101.0 °Inability to urinate °Nausea and/or vomiting °Extreme swelling or bruising °Continued bleeding from incision. °Increased pain, redness, or drainage from the incision. °Difficulty swallowing or breathing °Muscle cramping or spasms. °Numbness or tingling in hands or feet or around lips. ° °The clinic staff is available to answer your questions during regular business hours.  Please don’t hesitate to call and ask to speak to one of the nurses if you have concerns. ° °For   further questions, please visit [email or web address]  °

## 2021-02-23 NOTE — Progress Notes (Signed)
ANTICOAGULATION CONSULT NOTE - Consult  Pharmacy Consult for Heparin  Indication:  hx of DVT/PE - chronic AC w/ outpatient Eliquis  No known allergies  Patient Measurements: Height: 6\' 2"  (188 cm) Weight: (!) 149.1 kg (328 lb 11.3 oz) IBW/kg (Calculated) : 82.2  Heparin Dosing Weight: 116 kg  Vital Signs: Temp: 97.9 F (36.6 C) (09/30 0830) Temp Source: Oral (09/30 0830) BP: 140/82 (09/30 0830) Pulse Rate: 76 (09/30 0830)  Labs: Recent Labs    02/21/21 0818 02/21/21 1633 02/22/21 0059 02/22/21 0943 02/22/21 1929 02/23/21 0102  HGB 14.2  --  13.0  --   --  11.6*  HCT 44.5  --  42.0  --   --  37.2*  PLT 247  --  233  --   --  206  APTT  --  61* 120* 120*  --   --   HEPARINUNFRC  --  0.46 0.96* 0.93* 0.55 0.49  CREATININE 1.10  --   --   --   --  0.92    Estimated Creatinine Clearance: 130 mL/min (by C-G formula based on SCr of 0.92 mg/dL).   Medications:  Scheduled:   Chlorhexidine Gluconate Cloth  6 each Topical Daily   mouth rinse  15 mL Mouth Rinse BID   methocarbamol  500 mg Oral TID   pantoprazole (PROTONIX) IV  40 mg Intravenous Q24H   potassium chloride  40 mEq Oral BID   sodium chloride flush  3 mL Intravenous Q12H   Infusions:   sodium chloride 75 mL/hr at 02/23/21 0329   heparin 2,000 Units/hr (02/23/21 0308)   Assessment: 60 yo M presenting with SBO with incarcerated ventral hernia. Pt is NPO and heparin consult to continue therapeutic AC while holding PTA Eliquis. Last dose of Eliquis 9/24 PM per med rec. Baseline heparin level falsely elevated (0.44), aPTT normal (23).   Heparin level at goal at 0.49 after holding for 1 hour and decreasing rate 9/29. No bleeding noted and previous level appears to have been drawn appropriately. No issue with heparin infusion per RN.    Goal of Therapy:  - Heparin level 0.3-0.7 units/ml - Monitor platelets by anticoagulation protocol: Yes   Plan:  - Continue heparin at 2000 units/hr - Monitor daily heparin  levels and CBC   Thank you for allowing pharmacy to be a part of this patient's care.  Ardyth Harps, PharmD Clinical Pharmacist

## 2021-02-23 NOTE — Progress Notes (Signed)
Mobility Specialist Progress Note   02/23/21 1230  Mobility  Activity Ambulated in hall  Level of Assistance Contact guard assist, steadying assist  Assistive Device Front wheel walker  Distance Ambulated (ft) 470 ft  Mobility Ambulated with assistance in hallway  Mobility Response Tolerated well  Mobility performed by Mobility specialist  Bed Position Chair  $Mobility charge 1 Mobility   Received pt in recliner c/o no symptoms. Agreeable to mobility session. Asymptomatic throughout ambulation, returned to chair in front of sink in preparation for pericare w/ NT, RN's present in room as I departed.    Pre Mobility: 84 HR, 131/81 BP, 98% SpO2 During Mobility: [place] Mobility Specialist Phone Number [phone]

## 2021-02-23 NOTE — Progress Notes (Signed)
Physical Therapy Treatment Patient Details Name: Evan Leonard MRN: [id number] DOB: [birth year] Today's Date: 02/23/2021   History of Present Illness Pt is a 60 y.o. male admitted 02/19/21 with abdominal pain, nausea. CT showed complex ventral hernia with incarcerated small bowel and high-grade SBO. S/p ex-lap, hernia repair, small bowel resection on 9/27. PMH includes CAD, DM2, R foot ulcer, DVT/PE, HTN, PVD, SBO, OSA on CPAP, obesity.   PT Comments    Pt progressing with mobility. Today's session focused on bed mobility, transfer and gait training, incorporating strategies to maintain abdominal precautions for comfort; pt moving well with preference to use RW for stability. Pt remains limited pain, decreased activity tolerance, and impaired balance strategies/postural reactions. Will continue to follow acutely to address established goals. Encouraged more frequent activity with mobility specialists and nursing staff.  HR 90s-122 with activity    Recommendations for follow up therapy are one component of a multi-disciplinary discharge planning process, led by the attending physician.  Recommendations may be updated based on patient status, additional functional criteria and insurance authorization.  Follow Up Recommendations  No PT follow up     Equipment Recommendations  None recommended by PT    Recommendations for Other Services       Precautions / Restrictions Precautions Precautions: Fall;Other (comment) Precaution Comments: NGT (clamped), abdominal JP drain, abdominal binder (per PA - to wear binder for 4-6 weeks "when up and moving") Restrictions Weight Bearing Restrictions: No     Mobility  Bed Mobility Overal bed mobility: Needs Assistance Bed Mobility: Rolling;Sidelying to Sit Rolling: Supervision Sidelying to sit: Supervision       General bed mobility comments: Educ on log roll with bed flat for abdominal precautions; pt with too much pain attempting to roll  to L side, able to roll to R-side much easier, supervision for safety and technique    Transfers Overall transfer level: Needs assistance Equipment used: Rolling walker (2 wheeled) Transfers: Sit to/from Stand Sit to Stand: Supervision            Ambulation/Gait Ambulation/Gait assistance: Supervision Gait Distance (Feet): 520 Feet Assistive device: Rolling walker (2 wheeled) Gait Pattern/deviations: Step-through pattern;Decreased stride length;Trunk flexed Gait velocity: Decreased   General Gait Details: Pt requesting use of RW since first time up this morning. Slow, steady ambulation with RW, supervision for safety/lines; HR up to 122 with ambulation, tolerance improving   Stairs             Wheelchair Mobility    Modified Rankin (Stroke Patients Only)       Balance Overall balance assessment: Needs assistance Sitting-balance support: No upper extremity supported;Feet supported Sitting balance-Leahy Scale: Good     Standing balance support: Bilateral upper extremity supported;During functional activity;No upper extremity supported Standing balance-Leahy Scale: Fair Standing balance comment: Can static stand without UE support to perform standing ADL tasks                            Cognition Arousal/Alertness: Awake/alert Behavior During Therapy: WFL for tasks assessed/performed Overall Cognitive Status: Within Functional Limits for tasks assessed                                        Exercises      General Comments General comments (skin integrity, edema, etc.): HR up to 122 with ambulation; down to 90s upon sitting.  PA present at beginning of session, informed pt to wear abdominal binder for comfort/support for next 4-6 wks      Pertinent Vitals/Pain Pain Assessment: Faces Faces Pain Scale: Hurts little more Pain Location: abdomen Pain Descriptors / Indicators: Grimacing;Guarding Pain Intervention(s): Monitored  during session;Premedicated before session    Home Living                      Prior Function            PT Goals (current goals can now be found in the care plan section) Progress towards PT goals: Progressing toward goals    Frequency    Min 3X/week      PT Plan Current plan remains appropriate    Co-evaluation              AM-PAC PT "6 Clicks" Mobility   Outcome Measure  Help needed turning from your back to your side while in a flat bed without using bedrails?: A Little Help needed moving from lying on your back to sitting on the side of a flat bed without using bedrails?: A Little Help needed moving to and from a bed to a chair (including a wheelchair)?: A Little Help needed standing up from a chair using your arms (e.g., wheelchair or bedside chair)?: A Little Help needed to walk in hospital room?: A Little Help needed climbing 3-5 steps with a railing? : A Little 6 Click Score: 18    End of Session   Activity Tolerance: Patient tolerated treatment well Patient left: in chair;with call bell/phone within reach Nurse Communication: Mobility status PT Visit Diagnosis: Other abnormalities of gait and mobility (R26.89);Pain     Time: 7322-0254 PT Time Calculation (min) (ACUTE ONLY): 32 min  Charges:  $Gait Training: 8-22 mins $Therapeutic Activity: 8-22 mins                     Mabeline Caras, PT, DPT Acute Rehabilitation Services  Pager [phone] Office [place] 02/23/2021, 10:22 AM

## 2021-02-23 NOTE — Progress Notes (Signed)
PROGRESS NOTE    Evan Leonard  QMV:[id number] DOB: 10-Apr-1961 DOA: 02/19/2021 PCP: Shelva Majestic, MD   Brief Narrative:  This 60 y.o. male with PMH significant for essential hypertension, history of DVT on Eliquis, diabetes mellitus type 2,  morbid obesity presented with 2-day history of abdominal pain and nausea which is progressively getting worst.  CT scan of the abdomen and pelvis shows significant large and complex multilocular ventral hernia with incarcerated small bowel and high-grade small bowel obstruction.  General surgery was consulted who recommended NG tube placement to intermittent suction. Patient underwent exploratory laparotomy,  tolerated well. POD 3.  Patient is started on clear liquid diet, ambulating well passing flatus diet advance to full liquids.  Assessment & Plan:   Principal Problem:   SBO (small bowel obstruction) (HCC) Active Problems:   Morbid obesity with body mass index of 40.0-44.9 in adult [facility])   Obstructive sleep apnea   Leukocytosis   Diabetes mellitus with hyperglycemia (HCC)   Incarcerated ventral hernia   History of pulmonary embolism   S/P repair of recurrent ventral hernia   CAD (coronary artery disease)   Chronic anticoagulation   History of DVT (deep vein thrombosis)   Small bowel obstruction with incarcerated ventral hernia: Patient presented with abdominal pain associated with nausea and vomiting. CT A/P showed large and complex multilocular ventral hernia with incarcerated small bowel and high-grade small bowel obstruction.   General surgery consulted,  NG tube in place to intermittent suction. Adequate pain control. Kept NPO Patient underwent exploratory laparotomy, recurrent hernia repair with overlay mesh. Small bowel resection and lysis of adhesion Postoperative day 3, NGT was clamped and removed.  Patient started on clear liquid diet. Patient is able to ambulate and able to pass flatus.  Diet advance to full liquids.    CAD: Status post stent to the RCA last month due to an NSTEMI. At that time 2D echo showed an EF of 55% with grade 1 diastolic heart failure.   Continue statin, will resume Plavix once cleared from general surgery.  History of PE and DVT on chronic anticoagulation: Heparin resumed.  Will switch to Eliquis tomorrow.   Essential hypertension: Continue IV labetalol as needed. Resume home blood pressure medications.   Diabetes mellitus type 2 uncontrolled: Hemoglobin A1c 9.5.   Continue sliding scale insulin. CBGs every 4 hr.   GERD: Continue Protonix IV.  History of repair of ventral hernia: S/p exploratory laparotomy.   Morbid obesity: Counseled on diet and exercise.   Obstructive sleep apnea: CPAP at night.  Leukocytosis: Likely reactive due to small bowel obstruction now improved.   He remained afebrile, continue monitor fever curve.    DVT prophylaxis:  Heparin Code Status: Full code. Family Communication: No family at bed side. Disposition Plan:   Status is: Inpatient  Remains inpatient appropriate because:Inpatient level of care appropriate due to severity of illness  Dispo: The patient is from: Home              Anticipated d/c is to: SNF              Patient currently is not medically stable to d/c.   Difficult to place patient No   Consultants:  General surgery   Procedures: Exploratory laparotomy   antimicrobials:   Anti-infectives (From admission, onward)    Start     Dose/Rate Route Frequency Ordered Stop   02/20/21 1315  ceFAZolin (ANCEF) IVPB 3g/100 mL premix  3 g 200 mL/hr over 30 Minutes Intravenous  Once 02/20/21 1300 02/20/21 1326   02/20/21 1302  ceFAZolin (ANCEF) 3-0.9 GM/100ML-% IVPB       Note to Pharmacy: Marijo Sanes   : cabinet override      02/20/21 1302 02/20/21 1342   02/20/21 1252  ceFAZolin (ANCEF) 2-4 GM/100ML-% IVPB  Status:  Discontinued       Note to Pharmacy: Shanda Bumps   : cabinet override      02/20/21  1252 02/20/21 1334       Subjective: Patient was seen and examined at bedside.  Overnight events noted.   Patient reports feeling much improved. He was seen lying in the recliner. S/p exploratory laparotomy POD 3.  Patient is able to ambulate and able to pass flatus. Tolerated clear liquid diet,  advanced to full liquids.  Objective: Vitals:   02/22/21 2356 02/23/21 0412 02/23/21 0830 02/23/21 1224  BP: 115/78 (!) 152/86 140/82 131/81  Pulse: 78 75 76 81  Resp: 18 17 18 19   Temp: 98.4 F (36.9 C) 98 F (36.7 C) 97.9 F (36.6 C) 98.6 F (37 C)  TempSrc: Oral Oral Oral Oral  SpO2: 98% 99% 98% 98%  Weight:      Height:        Intake/Output Summary (Last 24 hours) at 02/23/2021 1435 Last data filed at 02/23/2021 0731 Gross per 24 hour  Intake 2472.63 ml  Output 100 ml  Net 2372.63 ml   Filed Weights   02/19/21 1145  Weight: (!) 149.1 kg    Examination:  General exam: Appears comfortable, not in any acute distress.   Respiratory system: Clear to auscultation bilaterally,  RR 15  cardiovascular system: S1-S2 heard, regular rate and rhythm, no murmur. Gastrointestinal system: Abdomen is soft, distended, mildly tender, midline surgical scar noted, BS  : sluggish Central nervous system: Alert and oriented  X 3. No focal neurological deficits. Extremities: No edema, no cyanosis, no clubbing. Skin: No rashes, lesions or ulcers Psychiatry: Judgement and insight appear normal. Mood & affect appropriate.     Data Reviewed: I have personally reviewed following labs and imaging studies  CBC: Recent Labs  Lab 02/19/21 0733 02/20/21 0500 02/21/21 0818 02/22/21 0059 02/23/21 0102  WBC 13.6* 9.4 12.8* 15.3* 15.2*  NEUTROABS 11.1*  --   --   --   --   HGB 15.2 14.2 14.2 13.0 11.6*  HCT 47.6 44.1 44.5 42.0 37.2*  MCV 91.7 93.2 92.5 93.3 92.3  PLT 259 213 247 233 206   Basic Metabolic Panel: Recent Labs  Lab 02/19/21 0733 02/20/21 0500 02/21/21 0818 02/23/21 0102   NA 136 139 140 137  K 3.9 4.0 4.7 3.5  CL 104 105 108 106  CO2 21* 23 22 23   GLUCOSE 173* 132* 142* 132*  BUN 17 19 16 11   CREATININE 1.02 1.01 1.10 0.92  CALCIUM 9.1 8.8* 8.4* 8.3*   GFR: Estimated Creatinine Clearance: 130 mL/min (by C-G formula based on SCr of 0.92 mg/dL). Liver Function Tests: Recent Labs  Lab 02/19/21 0733  AST 17  ALT 20  ALKPHOS 76  BILITOT 1.0  PROT 7.3  ALBUMIN 3.2*   Recent Labs  Lab 02/19/21 0733  LIPASE 23   No results for input(s): AMMONIA in the last 168 hours. Coagulation Profile: Recent Labs  Lab 02/19/21 1002 02/20/21 0500  INR 1.1 1.2   Cardiac Enzymes: No results for input(s): CKTOTAL, CKMB, CKMBINDEX, TROPONINI in the last 168 hours. BNP (  last 3 results) No results for input(s): PROBNP in the last 8760 hours. HbA1C: No results for input(s): HGBA1C in the last 72 hours. CBG: Recent Labs  Lab 02/21/21 1555 02/22/21 0622 02/22/21 1133 02/22/21 1354 02/23/21 0646  GLUCAP 127* 121* 121* 122* 134*   Lipid Profile: No results for input(s): CHOL, HDL, LDLCALC, TRIG, CHOLHDL, LDLDIRECT in the last 72 hours. Thyroid Function Tests: No results for input(s): TSH, T4TOTAL, FREET4, T3FREE, THYROIDAB in the last 72 hours. Anemia Panel: No results for input(s): VITAMINB12, FOLATE, FERRITIN, TIBC, IRON, RETICCTPCT in the last 72 hours. Sepsis Labs: No results for input(s): PROCALCITON, LATICACIDVEN in the last 168 hours.  Recent Results (from the past 240 hour(s))  Resp Panel by RT-PCR (Flu A&B, Covid) Nasopharyngeal Swab     Status: None   Collection Time: 02/19/21 10:19 AM   Specimen: Nasopharyngeal Swab; Nasopharyngeal(NP) swabs in vial transport medium  Result Value Ref Range Status   SARS Coronavirus 2 by RT PCR NEGATIVE NEGATIVE Final    Comment: (NOTE) SARS-CoV-2 target nucleic acids are NOT DETECTED.  The SARS-CoV-2 RNA is generally detectable in upper respiratory specimens during the acute phase of infection. The  lowest concentration of SARS-CoV-2 viral copies this assay can detect is 138 copies/mL. A negative result does not preclude SARS-Cov-2 infection and should not be used as the sole basis for treatment or other patient management decisions. A negative result may occur with  improper specimen collection/handling, submission of specimen other than nasopharyngeal swab, presence of viral mutation(s) within the areas targeted by this assay, and inadequate number of viral copies(<138 copies/mL). A negative result must be combined with clinical observations, patient history, and epidemiological information. The expected result is Negative.  Fact Sheet for Patients:  BloggerCourse.com  Fact Sheet for Healthcare Providers:  SeriousBroker.it  This test is no t yet approved or cleared by the [place] FDA and  has been authorized for detection and/or diagnosis of SARS-CoV-2 by FDA under an Emergency Use Authorization (EUA). This EUA will remain  in effect (meaning this test can be used) for the duration of the COVID-19 declaration under Section 564(b)(1) of the Act, 21 U.S.C.section 360bbb-3(b)(1), unless the authorization is terminated  or revoked sooner.       Influenza A by PCR NEGATIVE NEGATIVE Final   Influenza B by PCR NEGATIVE NEGATIVE Final    Comment: (NOTE) The Xpert Xpress SARS-CoV-2/FLU/RSV plus assay is intended as an aid in the diagnosis of influenza from Nasopharyngeal swab specimens and should not be used as a sole basis for treatment. Nasal washings and aspirates are unacceptable for Xpert Xpress SARS-CoV-2/FLU/RSV testing.  Fact Sheet for Patients: BloggerCourse.com  Fact Sheet for Healthcare Providers: SeriousBroker.it  This test is not yet approved or cleared by the [place] FDA and has been authorized for detection and/or diagnosis of SARS-CoV-2 by FDA under  an Emergency Use Authorization (EUA). This EUA will remain in effect (meaning this test can be used) for the duration of the COVID-19 declaration under Section 564(b)(1) of the Act, 21 U.S.C. section 360bbb-3(b)(1), unless the authorization is terminated or revoked.  Performed at [facility], [address], [place], [place] [address]   Surgical PCR screen     Status: None   Collection Time: 02/20/21 11:58 PM   Specimen: Nasal Mucosa; Nasal Swab  Result Value Ref Range Status   MRSA, PCR NEGATIVE NEGATIVE Final   Staphylococcus aureus NEGATIVE NEGATIVE Final    Comment: (NOTE) The Xpert SA Assay (FDA  approved for NASAL specimens in patients 67 years of age and older), is one component of a comprehensive surveillance program. It is not intended to diagnose infection nor to guide or monitor treatment. Performed at [facility], [address], [place], [place] [address]     Radiology Studies: No results found.  Scheduled Meds:  Chlorhexidine Gluconate Cloth  6 each Topical Daily   mouth rinse  15 mL Mouth Rinse BID   methocarbamol  500 mg Oral TID   pantoprazole (PROTONIX) IV  40 mg Intravenous Q24H   potassium chloride  40 mEq Oral BID   sodium chloride flush  3 mL Intravenous Q12H   Continuous Infusions:  sodium chloride 50 mL/hr at 02/23/21 1015   heparin 2,000 Units/hr (02/23/21 1254)     LOS: 4 days    Time spent: 25 mins    Delno Blaisdell, MD [facility]   If 7PM-7AM, please contact night-coverage

## 2021-02-24 DIAGNOSIS — K56609 Unspecified intestinal obstruction, unspecified as to partial versus complete obstruction: Secondary | ICD-10-CM | POA: Diagnosis not present

## 2021-02-24 LAB — BASIC METABOLIC PANEL
Anion gap: 9 (ref 5–15)
BUN: 9 mg/dL (ref 8–23)
CO2: 20 mmol/L — ABNORMAL LOW (ref 22–32)
Calcium: 8.4 mg/dL — ABNORMAL LOW (ref 8.9–10.3)
Chloride: 107 mmol/L (ref 98–111)
Creatinine, Ser: 0.81 mg/dL (ref 0.61–1.24)
GFR, Estimated: >60 mL/min (ref >60)
Glucose, Bld: 143 mg/dL — ABNORMAL HIGH (ref 70–99)
Potassium: 3.7 mmol/L (ref 3.5–5.1)
Sodium: 136 mmol/L (ref 135–145)

## 2021-02-24 LAB — HEPARIN LEVEL (UNFRACTIONATED): Heparin Unfractionated: 0.37 IU/mL (ref 0.30–0.70)

## 2021-02-24 LAB — PHOSPHORUS: Phosphorus: 2 mg/dL — ABNORMAL LOW (ref 2.5–4.6)

## 2021-02-24 LAB — CBC
HCT: 40.5 % (ref 39.0–52.0)
Hemoglobin: 13 g/dL (ref 13.0–17.0)
MCH: 29.1 pg (ref 26.0–34.0)
MCHC: 32.1 g/dL (ref 30.0–36.0)
MCV: 90.8 fL (ref 80.0–100.0)
Platelets: 232 10*3/uL (ref 150–400)
RBC: 4.46 MIL/uL (ref 4.22–5.81)
RDW: 14.3 % (ref 11.5–15.5)
WBC: 11.8 10*3/uL — ABNORMAL HIGH (ref 4.0–10.5)
nRBC: 0 % (ref 0.0–0.2)

## 2021-02-24 LAB — MAGNESIUM: Magnesium: 1.7 mg/dL (ref 1.7–2.4)

## 2021-02-24 LAB — GLUCOSE, CAPILLARY: Glucose-Capillary: 127 mg/dL — ABNORMAL HIGH (ref 70–99)

## 2021-02-24 MED ORDER — APIXABAN 5 MG PO TABS
5.0000 mg | ORAL_TABLET | Freq: Two times a day (BID) | ORAL | Status: DC
  Administered 2021-02-24 – 2021-02-26 (×5): 5 mg via ORAL
  Filled 2021-02-24 (×5): qty 1

## 2021-02-24 MED ORDER — CLOPIDOGREL BISULFATE 75 MG PO TABS
75.0000 mg | ORAL_TABLET | Freq: Every day | ORAL | Status: DC
  Administered 2021-02-25 – 2021-02-26 (×2): 75 mg via ORAL
  Filled 2021-02-24 (×2): qty 1

## 2021-02-24 MED ORDER — ACETAMINOPHEN 325 MG PO TABS
650.0000 mg | ORAL_TABLET | Freq: Four times a day (QID) | ORAL | Status: DC
  Administered 2021-02-24 – 2021-02-26 (×9): 650 mg via ORAL
  Filled 2021-02-24 (×9): qty 2

## 2021-02-24 MED ORDER — PANTOPRAZOLE SODIUM 40 MG PO TBEC
40.0000 mg | DELAYED_RELEASE_TABLET | Freq: Every day | ORAL | Status: DC
  Administered 2021-02-24 – 2021-02-26 (×3): 40 mg via ORAL
  Filled 2021-02-24 (×3): qty 1

## 2021-02-24 MED ORDER — HYDROMORPHONE HCL 1 MG/ML IJ SOLN: 0.5000 mg | Freq: Four times a day (QID) | INTRAMUSCULAR | Status: DC | PRN

## 2021-02-24 NOTE — Progress Notes (Signed)
Patient ID: Evan Leonard, male   DOB: 17-Sep-1960, 60 y.o.   MRN: [id number]  [facility] Surgery Progress Note  4 Days Post-Op  Subjective: CC-  Abdomen feels a little blah this morning. Patient states that he is feeling some movement in his abdomen. Denies n/v. Tolerating full liquids but not eating a lot. He had two Bms over night and passed gas. He was given dilaudid twice and no oxy yesterday for pain control. Ambulated in the halls twice yesterday.  Objective: Vital signs in last 24 hours: Temp:  [98 F (36.7 C)-98.6 F (37 C)] 98 F (36.7 C) (10/01 0417) Pulse Rate:  [75-94] 75 (10/01 0417) Resp:  [16-20] 16 (10/01 0417) BP: (119-133)/(68-86) 122/86 (10/01 0417) SpO2:  [97 %-99 %] 98 % (10/01 0417) Last BM Date: 02/23/21  Intake/Output from previous day: 09/30 0701 - 10/01 0700 In: 960 [P.O.:960] Out: 641 [Urine:450; Drains:190; Stool:1] Intake/Output this shift: No intake/output data recorded.  PE: General: Alert, NAD Lungs:  Respiratory effort nonlabored Abd: soft, appropriately ttp, incision cdi with staples present and no erythema or drainage, drain with SS fluid  Lab Results:  Recent Labs    02/23/21 0102 02/24/21 0101  WBC 15.2* 11.8*  HGB 11.6* 13.0  HCT 37.2* 40.5  PLT 206 232   BMET Recent Labs    02/23/21 0102 02/24/21 0101  NA 137 136  K 3.5 3.7  CL 106 107  CO2 23 20*  GLUCOSE 132* 143*  BUN 11 9  CREATININE 0.92 0.81  CALCIUM 8.3* 8.4*   PT/INR No results for input(s): LABPROT, INR in the last 72 hours. CMP     Component Value Date/Time   NA 136 02/24/2021 0101   K 3.7 02/24/2021 0101   CL 107 02/24/2021 0101   CO2 20 (L) 02/24/2021 0101   GLUCOSE 143 (H) 02/24/2021 0101   BUN 9 02/24/2021 0101   CREATININE 0.81 02/24/2021 0101   CREATININE 0.94 03/06/2020 1623   CALCIUM 8.4 (L) 02/24/2021 0101   PROT 7.3 02/19/2021 0733   ALBUMIN 3.2 (L) 02/19/2021 0733   AST 17 02/19/2021 0733   ALT 20 02/19/2021 0733    ALKPHOS 76 02/19/2021 0733   BILITOT 1.0 02/19/2021 0733   GFRNONAA >60 02/24/2021 0101   GFRAA >60 12/06/2019 1755   Lipase     Component Value Date/Time   LIPASE 23 02/19/2021 0733       Studies/Results: No results found.  Anti-infectives: Anti-infectives (From admission, onward)    Start     Dose/Rate Route Frequency Ordered Stop   02/20/21 1315  ceFAZolin (ANCEF) IVPB 3g/100 mL premix        3 g 200 mL/hr over 30 Minutes Intravenous  Once 02/20/21 1300 02/20/21 1326   02/20/21 1302  ceFAZolin (ANCEF) 3-0.9 GM/100ML-% IVPB       Note to Pharmacy: Mendel Corning   : cabinet override      02/20/21 1302 02/20/21 1342   02/20/21 1252  ceFAZolin (ANCEF) 2-4 GM/100ML-% IVPB  Status:  Discontinued       Note to Pharmacy: Cameron Sprang   : cabinet override      02/20/21 1252 02/20/21 1334        Assessment/Plan POD4 S/P exploratory laparotomy, recurrent hernia repair with mesh, small bowel resection 9/27 Dr. Kieth Brightly - tolerating FLD and bowel function returning, advance to soft diet - continue to mobilize, PT - continue JP drain and monitor output - plan to d/c home with drain - add  scheduled tylenol to robaxin for better pain control, dilaudid only for breakthrough pain - may be ready for discharge in 1-2 days from surgical standpoint   FEN: soft diet  VTE: heparin gtt, ok to resume eliquis today from surgical standpoint ID: Ancef periop   CAD s/p Recent cardiac stent placement 01/02/21 - cards following, on heparin gtt currently HTN HLD Hx of DVT/PE 2013 T2DM Peripheral vascular disease OSA  Morbid obesity - BMI 42.2   LOS: 5 days    Wellington Hampshire, [facility] Surgery 02/24/2021, 8:58 AM Please see Amion for pager number during day hours 7:00am-4:30pm

## 2021-02-24 NOTE — Progress Notes (Signed)
ANTICOAGULATION CONSULT NOTE - Consult  Pharmacy Consult for Heparin  Indication:  hx of DVT/PE - chronic AC w/ outpatient Eliquis  No known allergies  Patient Measurements: Height: 6\' 2"  (188 cm) Weight: (!) 149.1 kg (328 lb 11.3 oz) IBW/kg (Calculated) : 82.2  Heparin Dosing Weight: 116 kg  Vital Signs: Temp: 98.4 F (36.9 C) (10/01 1058) Temp Source: Oral (10/01 1058) BP: 127/75 (10/01 1058) Pulse Rate: 78 (10/01 1058)  Labs: Recent Labs    02/21/21 1633 02/21/21 1633 02/22/21 0059 02/22/21 0943 02/22/21 1929 02/23/21 0102 02/24/21 0101  HGB  --    < > 13.0  --   --  11.6* 13.0  HCT  --   --  42.0  --   --  37.2* 40.5  PLT  --   --  233  --   --  206 232  APTT 61*  --  120* 120*  --   --   --   HEPARINUNFRC 0.46  --  0.96* 0.93* 0.55 0.49 0.37  CREATININE  --   --   --   --   --  0.92 0.81   < > = values in this interval not displayed.     Estimated Creatinine Clearance: 147.7 mL/min (by C-G formula based on SCr of 0.81 mg/dL).   Medications:  Scheduled:   acetaminophen  650 mg Oral Q6H   apixaban  5 mg Oral BID   Chlorhexidine Gluconate Cloth  6 each Topical Daily   [START ON 02/25/2021] clopidogrel  75 mg Oral Q breakfast   mouth rinse  15 mL Mouth Rinse BID   methocarbamol  500 mg Oral TID   pantoprazole  40 mg Oral Daily   sodium chloride flush  3 mL Intravenous Q12H   Infusions:   sodium chloride 50 mL/hr at 02/23/21 2230   Assessment: 60 yo M presenting with SBO with incarcerated ventral hernia. Pt is NPO and heparin consult to continue therapeutic AC while holding PTA Eliquis. Last dose of Eliquis 9/24 PM per med rec. Baseline heparin level falsely elevated (0.44), aPTT normal (23).   Heparin level at goal at 0.37 on 2000 units/hr. Heparin will now be transitioned to apixaban.     Plan:  - Stop heparin drip - Start apixaban 5 mg twice daily - Monitor daily CBC   Thank you for allowing pharmacy to be a part of this patient's  care.  Zenaida Deed, PharmD PGY1 Acute Care Pharmacy Resident  Phone: [phone] 02/24/2021  1:02 PM  Please check [email or web address] for unit-specific pharmacy phone numbers.

## 2021-02-24 NOTE — Progress Notes (Signed)
PROGRESS NOTE    Adam Rentschler Shannon  AOZ:[id number] DOB: 09-28-1960 DOA: 02/19/2021 PCP: Shelva Majestic, MD   Brief Narrative:  This 60 y.o. male with PMH significant for essential hypertension, history of DVT on Eliquis, diabetes mellitus type 2,  morbid obesity presented with 2-day history of abdominal pain and nausea which is progressively getting worst.  CT scan of the abdomen and pelvis shows significant large and complex multilocular ventral hernia with incarcerated small bowel and high-grade small bowel obstruction.  General surgery was consulted who recommended NG tube placement to intermittent suction. Patient underwent exploratory laparotomy,  tolerated well. POD 3.  Patient started on clear liquid diet,  tolerated well. Patient up and ambulating and passing flatus,  advanced diet to full liquids to soft.  Assessment & Plan:   Principal Problem:   SBO (small bowel obstruction) (HCC) Active Problems:   Morbid obesity with body mass index of 40.0-44.9 in adult [facility])   Obstructive sleep apnea   Leukocytosis   Diabetes mellitus with hyperglycemia (HCC)   Incarcerated ventral hernia   History of pulmonary embolism   S/P repair of recurrent ventral hernia   CAD (coronary artery disease)   Chronic anticoagulation   History of DVT (deep vein thrombosis)   Small bowel obstruction with incarcerated ventral hernia: Patient presented with abdominal pain associated with nausea and vomiting. CT A/P showed large and complex multilocular ventral hernia with incarcerated small bowel and high-grade small bowel obstruction.   General surgery consulted,  NG tube in place to intermittent suction. Adequate pain control. Kept NPO Patient underwent exploratory laparotomy, recurrent hernia repair with overlay mesh. Small bowel resection and lysis of adhesion Postoperative day 4 , NGT was clamped and removed.  Patient started on clear liquid diet. Patient is able to ambulate and able to pass  flatus.  Patient did have 2 bowel movements last night. Diet advance to soft bland diet.  Advance as tolerated.   CAD: Status post stent to the RCA last month due to an NSTEMI. At that time 2D echo showed an EF of 55% with grade 1 diastolic heart failure.   Continue statin, resume Plavix today as cleared by general surgery.  History of PE and DVT on chronic anticoagulation: Heparin resumed.  We will switch to Eliquis today.   Essential hypertension: Continue IV labetalol as needed. Continue home blood pressure medication   Diabetes mellitus type 2 uncontrolled: Hemoglobin A1c 9.5.   Continue sliding scale insulin. CBGs every 4 hr.   GERD: Continue Protonix IV.  History of repair of ventral hernia: S/p exploratory laparotomy.   Morbid obesity: Counseled on diet and exercise.   Obstructive sleep apnea: CPAP at night.  Leukocytosis: Likely reactive due to small bowel obstruction now improved.   He remained afebrile, continue monitor fever curve.    DVT prophylaxis:  Heparin Code Status: Full code. Family Communication: No family at bed side. Disposition Plan:   Status is: Inpatient  Remains inpatient appropriate because:Inpatient level of care appropriate due to severity of illness  Dispo: The patient is from: Home              Anticipated d/c is to: SNF              Patient currently is not medically stable to d/c.   Difficult to place patient No   Consultants:  General surgery   Procedures: Exploratory laparotomy   antimicrobials:   Anti-infectives (From admission, onward)    Start  Dose/Rate Route Frequency Ordered Stop   02/20/21 1315  ceFAZolin (ANCEF) IVPB 3g/100 mL premix        3 g 200 mL/hr over 30 Minutes Intravenous  Once 02/20/21 1300 02/20/21 1326   02/20/21 1302  ceFAZolin (ANCEF) 3-0.9 GM/100ML-% IVPB       Note to Pharmacy: Marijo Sanes   : cabinet override      02/20/21 1302 02/20/21 1342   02/20/21 1252  ceFAZolin (ANCEF) 2-4  GM/100ML-% IVPB  Status:  Discontinued       Note to Pharmacy: Shanda Bumps   : cabinet override      02/20/21 1252 02/20/21 1334       Subjective: Patient was seen and examined at bedside.  Overnight events noted.   Patient reports feeling much improved.  He is lying in the bed. S/p exploratory laparotomy POD 4.  Patient has ambulated in the hallway and able to pass flatus.   He had 2 bowel movement last night.  Diet advanced to soft bland diet.  Objective: Vitals:   02/23/21 1947 02/23/21 2327 02/24/21 0417 02/24/21 1058  BP: 119/68 128/82 122/86 127/75  Pulse: 93 89 75 78  Resp: 20 20 16 16   Temp: 98.5 F (36.9 C) 98.4 F (36.9 C) 98 F (36.7 C) 98.4 F (36.9 C)  TempSrc: Oral Oral Oral Oral  SpO2: 97% 97% 98% 99%  Weight:      Height:        Intake/Output Summary (Last 24 hours) at 02/24/2021 1251 Last data filed at 02/24/2021 0417 Gross per 24 hour  Intake 960 ml  Output 521 ml  Net 439 ml   Filed Weights   02/19/21 1145  Weight: (!) 149.1 kg    Examination:  General exam: Appears comfortable, not in any acute distress. Respiratory system: To auscultation bilaterally, respiratory effort normal, RR 15  Cardiovascular system: S1 S2 heard, regular rate and rhythm, no murmur. Gastrointestinal system: Abdomen is soft, mildly distended, mildly tender, midline surgical scar noted, BS : Sluggish Central nervous system: Alert and oriented  X 3. No focal neurological deficits. Extremities: No edema, no cyanosis, no clubbing. Skin: No rashes, lesions or ulcers Psychiatry: Judgement and insight appear normal. Mood & affect appropriate.     Data Reviewed: I have personally reviewed following labs and imaging studies  CBC: Recent Labs  Lab 02/19/21 0733 02/20/21 0500 02/21/21 0818 02/22/21 0059 02/23/21 0102 02/24/21 0101  WBC 13.6* 9.4 12.8* 15.3* 15.2* 11.8*  NEUTROABS 11.1*  --   --   --   --   --   HGB 15.2 14.2 14.2 13.0 11.6* 13.0  HCT 47.6 44.1 44.5  42.0 37.2* 40.5  MCV 91.7 93.2 92.5 93.3 92.3 90.8  PLT 259 213 247 233 206 232   Basic Metabolic Panel: Recent Labs  Lab 02/19/21 0733 02/20/21 0500 02/21/21 0818 02/23/21 0102 02/24/21 0101  NA 136 139 140 137 136  K 3.9 4.0 4.7 3.5 3.7  CL 104 105 108 106 107  CO2 21* 23 22 23  20*  GLUCOSE 173* 132* 142* 132* 143*  BUN 17 19 16 11 9   CREATININE 1.02 1.01 1.10 0.92 0.81  CALCIUM 9.1 8.8* 8.4* 8.3* 8.4*  MG  --   --   --   --  1.7  PHOS  --   --   --   --  2.0*   GFR: Estimated Creatinine Clearance: 147.7 mL/min (by C-G formula based on SCr of 0.81 mg/dL). Liver Function  Tests: Recent Labs  Lab 02/19/21 0733  AST 17  ALT 20  ALKPHOS 76  BILITOT 1.0  PROT 7.3  ALBUMIN 3.2*   Recent Labs  Lab 02/19/21 0733  LIPASE 23   No results for input(s): AMMONIA in the last 168 hours. Coagulation Profile: Recent Labs  Lab 02/19/21 1002 02/20/21 0500  INR 1.1 1.2   Cardiac Enzymes: No results for input(s): CKTOTAL, CKMB, CKMBINDEX, TROPONINI in the last 168 hours. BNP (last 3 results) No results for input(s): PROBNP in the last 8760 hours. HbA1C: No results for input(s): HGBA1C in the last 72 hours. CBG: Recent Labs  Lab 02/22/21 0622 02/22/21 1133 02/22/21 1354 02/23/21 0646 02/24/21 0615  GLUCAP 121* 121* 122* 134* 127*   Lipid Profile: No results for input(s): CHOL, HDL, LDLCALC, TRIG, CHOLHDL, LDLDIRECT in the last 72 hours. Thyroid Function Tests: No results for input(s): TSH, T4TOTAL, FREET4, T3FREE, THYROIDAB in the last 72 hours. Anemia Panel: No results for input(s): VITAMINB12, FOLATE, FERRITIN, TIBC, IRON, RETICCTPCT in the last 72 hours. Sepsis Labs: No results for input(s): PROCALCITON, LATICACIDVEN in the last 168 hours.  Recent Results (from the past 240 hour(s))  Resp Panel by RT-PCR (Flu A&B, Covid) Nasopharyngeal Swab     Status: None   Collection Time: 02/19/21 10:19 AM   Specimen: Nasopharyngeal Swab; Nasopharyngeal(NP) swabs in vial  transport medium  Result Value Ref Range Status   SARS Coronavirus 2 by RT PCR NEGATIVE NEGATIVE Final    Comment: (NOTE) SARS-CoV-2 target nucleic acids are NOT DETECTED.  The SARS-CoV-2 RNA is generally detectable in upper respiratory specimens during the acute phase of infection. The lowest concentration of SARS-CoV-2 viral copies this assay can detect is 138 copies/mL. A negative result does not preclude SARS-Cov-2 infection and should not be used as the sole basis for treatment or other patient management decisions. A negative result may occur with  improper specimen collection/handling, submission of specimen other than nasopharyngeal swab, presence of viral mutation(s) within the areas targeted by this assay, and inadequate number of viral copies(<138 copies/mL). A negative result must be combined with clinical observations, patient history, and epidemiological information. The expected result is Negative.  Fact Sheet for Patients:  BloggerCourse.com  Fact Sheet for Healthcare Providers:  SeriousBroker.it  This test is no t yet approved or cleared by the [place] FDA and  has been authorized for detection and/or diagnosis of SARS-CoV-2 by FDA under an Emergency Use Authorization (EUA). This EUA will remain  in effect (meaning this test can be used) for the duration of the COVID-19 declaration under Section 564(b)(1) of the Act, 21 U.S.C.section 360bbb-3(b)(1), unless the authorization is terminated  or revoked sooner.       Influenza A by PCR NEGATIVE NEGATIVE Final   Influenza B by PCR NEGATIVE NEGATIVE Final    Comment: (NOTE) The Xpert Xpress SARS-CoV-2/FLU/RSV plus assay is intended as an aid in the diagnosis of influenza from Nasopharyngeal swab specimens and should not be used as a sole basis for treatment. Nasal washings and aspirates are unacceptable for Xpert Xpress SARS-CoV-2/FLU/RSV testing.  Fact  Sheet for Patients: BloggerCourse.com  Fact Sheet for Healthcare Providers: SeriousBroker.it  This test is not yet approved or cleared by the [place] FDA and has been authorized for detection and/or diagnosis of SARS-CoV-2 by FDA under an Emergency Use Authorization (EUA). This EUA will remain in effect (meaning this test can be used) for the duration of the COVID-19 declaration under Section 564(b)(1) of the Act,  21 U.S.C. section 360bbb-3(b)(1), unless the authorization is terminated or revoked.  Performed at [facility], [address], [place], [place] [address]   Surgical PCR screen     Status: None   Collection Time: 02/20/21 11:58 PM   Specimen: Nasal Mucosa; Nasal Swab  Result Value Ref Range Status   MRSA, PCR NEGATIVE NEGATIVE Final   Staphylococcus aureus NEGATIVE NEGATIVE Final    Comment: (NOTE) The Xpert SA Assay (FDA approved for NASAL specimens in patients 61 years of age and older), is one component of a comprehensive surveillance program. It is not intended to diagnose infection nor to guide or monitor treatment. Performed at [facility], [address], [place], [place] [address]     Radiology Studies: No results found.  Scheduled Meds:  acetaminophen  650 mg Oral Q6H   Chlorhexidine Gluconate Cloth  6 each Topical Daily   mouth rinse  15 mL Mouth Rinse BID   methocarbamol  500 mg Oral TID   pantoprazole  40 mg Oral Daily   sodium chloride flush  3 mL Intravenous Q12H   Continuous Infusions:  sodium chloride 50 mL/hr at 02/23/21 2230   heparin 2,000 Units/hr (02/24/21 0251)     LOS: 5 days    Time spent: 25 mins    Lucella Pommier, MD [facility]   If 7PM-7AM, please contact night-coverage

## 2021-02-25 DIAGNOSIS — K56609 Unspecified intestinal obstruction, unspecified as to partial versus complete obstruction: Secondary | ICD-10-CM | POA: Diagnosis not present

## 2021-02-25 DIAGNOSIS — L8990 Pressure ulcer of unspecified site, unspecified stage: Secondary | ICD-10-CM | POA: Insufficient documentation

## 2021-02-25 LAB — BASIC METABOLIC PANEL
Anion gap: 7 (ref 5–15)
BUN: 5 mg/dL — ABNORMAL LOW (ref 8–23)
CO2: 23 mmol/L (ref 22–32)
Calcium: 8.4 mg/dL — ABNORMAL LOW (ref 8.9–10.3)
Chloride: 107 mmol/L (ref 98–111)
Creatinine, Ser: 0.84 mg/dL (ref 0.61–1.24)
GFR, Estimated: >60 mL/min (ref >60)
Glucose, Bld: 149 mg/dL — ABNORMAL HIGH (ref 70–99)
Potassium: 3.5 mmol/L (ref 3.5–5.1)
Sodium: 137 mmol/L (ref 135–145)

## 2021-02-25 LAB — CBC
HCT: 34.9 % — ABNORMAL LOW (ref 39.0–52.0)
Hemoglobin: 11.1 g/dL — ABNORMAL LOW (ref 13.0–17.0)
MCH: 28.9 pg (ref 26.0–34.0)
MCHC: 31.8 g/dL (ref 30.0–36.0)
MCV: 90.9 fL (ref 80.0–100.0)
Platelets: 224 10*3/uL (ref 150–400)
RBC: 3.84 MIL/uL — ABNORMAL LOW (ref 4.22–5.81)
RDW: 14.4 % (ref 11.5–15.5)
WBC: 10.4 10*3/uL (ref 4.0–10.5)
nRBC: 0 % (ref 0.0–0.2)

## 2021-02-25 LAB — GLUCOSE, CAPILLARY: Glucose-Capillary: 147 mg/dL — ABNORMAL HIGH (ref 70–99)

## 2021-02-25 MED ORDER — ATORVASTATIN CALCIUM 80 MG PO TABS
80.0000 mg | ORAL_TABLET | Freq: Every day | ORAL | Status: DC
  Administered 2021-02-25 – 2021-02-26 (×2): 80 mg via ORAL
  Filled 2021-02-25 (×2): qty 1

## 2021-02-25 MED ORDER — METOPROLOL SUCCINATE ER 50 MG PO TB24
50.0000 mg | ORAL_TABLET | Freq: Every day | ORAL | Status: DC
  Administered 2021-02-25 – 2021-02-26 (×2): 50 mg via ORAL
  Filled 2021-02-25 (×2): qty 1

## 2021-02-25 NOTE — Progress Notes (Signed)
PROGRESS NOTE    Evan Leonard  GEX:[id number] DOB: 24-Sep-1960 DOA: 02/19/2021 PCP: Shelva Majestic, MD   Brief Narrative:  This 60 y.o. male with PMH significant for essential hypertension, history of DVT on Eliquis, diabetes mellitus type 2,  morbid obesity presented with 2-day history of abdominal pain and nausea which is progressively getting worst.  CT scan of the abdomen and pelvis shows significant large and complex multilocular ventral hernia with incarcerated small bowel and high-grade small bowel obstruction.  General surgery was consulted who recommended NG tube placement to intermittent suction. Patient underwent exploratory laparotomy,  tolerated well. POD 5.  Patient started on clear liquid diet,  tolerated well. Patient up and ambulating and passing flatus,  advanced diet to full liquids to soft.  Patient tolerated soft bland diet, reports mild nausea.  Assessment & Plan:   Principal Problem:   SBO (small bowel obstruction) (HCC) Active Problems:   Morbid obesity with body mass index of 40.0-44.9 in adult [facility])   Obstructive sleep apnea   Leukocytosis   Diabetes mellitus with hyperglycemia (HCC)   Incarcerated ventral hernia   History of pulmonary embolism   S/P repair of recurrent ventral hernia   CAD (coronary artery disease)   Chronic anticoagulation   History of DVT (deep vein thrombosis)   Pressure injury of skin   Small bowel obstruction with incarcerated ventral hernia: Patient presented with abdominal pain associated with nausea and vomiting. CT A/P showed large and complex multilocular ventral hernia with incarcerated small bowel and high-grade small bowel obstruction.   General surgery consulted,  NG tube in place to intermittent suction. Adequate pain control. Kept NPO Patient underwent exploratory laparotomy, recurrent hernia repair with overlay mesh. Small bowel resection and lysis of adhesion Postoperative day 5 , NGT was clamped and removed.   Patient started on clear liquid diet. Patient is able to ambulate and able to pass flatus. Patient has been having bowel movements. Diet advanced to soft bland diet.  Tolerated diet but reports nausea. Anticipated discharge home tomorrow.   CAD: Status post stent to the RCA last month due to an NSTEMI. At that time 2D echo showed an EF of 55% with grade 1 diastolic heart failure.   Continue statin, Plavix resumed yesterday.  History of PE and DVT on chronic anticoagulation: Heparin discontinued, resumed Eliquis yesterday. Hemoglobin remained stable.   Essential hypertension: Continue metoprolol 50 mg daily.   Diabetes mellitus type 2 uncontrolled: Hemoglobin A1c 9.5.   Continue sliding scale insulin. CBGs every 4 hr.   GERD: Continue Protonix IV.  History of repair of ventral hernia: S/p exploratory laparotomy.   Morbid obesity: Counseled on diet and exercise.   Obstructive sleep apnea: CPAP at night.  Leukocytosis: > Resolved. Likely reactive due to small bowel obstruction now improved.   He remained afebrile, continue monitor fever curve.    DVT prophylaxis:  Heparin Code Status: Full code. Family Communication: No family at bed side. Disposition Plan:   Status is: Inpatient  Remains inpatient appropriate because:Inpatient level of care appropriate due to severity of illness  Dispo: The patient is from: Home              Anticipated d/c is to: Home               Patient currently is not medically stable to d/c.   Difficult to place patient No   Consultants:  General surgery   Procedures: Exploratory laparotomy   antimicrobials:   Anti-infectives (From  admission, onward)    Start     Dose/Rate Route Frequency Ordered Stop   02/20/21 1315  ceFAZolin (ANCEF) IVPB 3g/100 mL premix        3 g 200 mL/hr over 30 Minutes Intravenous  Once 02/20/21 1300 02/20/21 1326   02/20/21 1302  ceFAZolin (ANCEF) 3-0.9 GM/100ML-% IVPB       Note to Pharmacy: Marijo Sanes   : cabinet override      02/20/21 1302 02/20/21 1342   02/20/21 1252  ceFAZolin (ANCEF) 2-4 GM/100ML-% IVPB  Status:  Discontinued       Note to Pharmacy: Shanda Bumps   : cabinet override      02/20/21 1252 02/20/21 1334       Subjective: Patient was seen and examined at bedside.  Overnight events noted.   S/p exploratory laparotomy POD 5.   Patient has been ambulating in the hallway and able to pass flatus and has bowel movement. He tolerated soft bland diet but reports abdominal soreness and nausea  Objective: Vitals:   02/24/21 2114 02/24/21 2332 02/25/21 0346 02/25/21 0830  BP: (!) 159/93 131/85 (!) 141/86 123/75  Pulse: 80 75 70 73  Resp: 18 19 18 19   Temp: 98.4 F (36.9 C) 98.4 F (36.9 C) 98.4 F (36.9 C) 98.2 F (36.8 C)  TempSrc: Oral Oral Oral Oral  SpO2: 99% 98% 98% 100%  Weight:      Height:        Intake/Output Summary (Last 24 hours) at 02/25/2021 1313 Last data filed at 02/25/2021 0630 Gross per 24 hour  Intake 601 ml  Output 2551 ml  Net -1950 ml   Filed Weights   02/19/21 1145  Weight: (!) 149.1 kg    Examination:  General exam: Appears comfortable, not in any acute distress. Respiratory system: To auscultation bilaterally, respiratory effort normal, RR 15  Cardiovascular system: S1 S2 heard, regular rate and rhythm, no murmur. Gastrointestinal system: Abdomen is soft, mildly distended, tender, midline surgical scar noted, BS +. Central nervous system: Alert and oriented  X 3. No focal neurological deficits. Extremities: No edema, no cyanosis, no clubbing. Skin: No rashes, lesions or ulcers Psychiatry: Judgement and insight appear normal. Mood & affect appropriate.     Data Reviewed: I have personally reviewed following labs and imaging studies  CBC: Recent Labs  Lab 02/19/21 0733 02/20/21 0500 02/21/21 0818 02/22/21 0059 02/23/21 0102 02/24/21 0101 02/25/21 0115  WBC 13.6*   < > 12.8* 15.3* 15.2* 11.8* 10.4  NEUTROABS  11.1*  --   --   --   --   --   --   HGB 15.2   < > 14.2 13.0 11.6* 13.0 11.1*  HCT 47.6   < > 44.5 42.0 37.2* 40.5 34.9*  MCV 91.7   < > 92.5 93.3 92.3 90.8 90.9  PLT 259   < > 247 233 206 232 224   < > = values in this interval not displayed.   Basic Metabolic Panel: Recent Labs  Lab 02/20/21 0500 02/21/21 0818 02/23/21 0102 02/24/21 0101 02/25/21 0115  NA 139 140 137 136 137  K 4.0 4.7 3.5 3.7 3.5  CL 105 108 106 107 107  CO2 23 22 23  20* 23  GLUCOSE 132* 142* 132* 143* 149*  BUN 19 16 11 9  5*  CREATININE 1.01 1.10 0.92 0.81 0.84  CALCIUM 8.8* 8.4* 8.3* 8.4* 8.4*  MG  --   --   --  1.7  --  PHOS  --   --   --  2.0*  --    GFR: Estimated Creatinine Clearance: 142.4 mL/min (by C-G formula based on SCr of 0.84 mg/dL). Liver Function Tests: Recent Labs  Lab 02/19/21 0733  AST 17  ALT 20  ALKPHOS 76  BILITOT 1.0  PROT 7.3  ALBUMIN 3.2*   Recent Labs  Lab 02/19/21 0733  LIPASE 23   No results for input(s): AMMONIA in the last 168 hours. Coagulation Profile: Recent Labs  Lab 02/19/21 1002 02/20/21 0500  INR 1.1 1.2   Cardiac Enzymes: No results for input(s): CKTOTAL, CKMB, CKMBINDEX, TROPONINI in the last 168 hours. BNP (last 3 results) No results for input(s): PROBNP in the last 8760 hours. HbA1C: No results for input(s): HGBA1C in the last 72 hours. CBG: Recent Labs  Lab 02/22/21 1133 02/22/21 1354 02/23/21 0646 02/24/21 0615 02/25/21 0632  GLUCAP 121* 122* 134* 127* 147*   Lipid Profile: No results for input(s): CHOL, HDL, LDLCALC, TRIG, CHOLHDL, LDLDIRECT in the last 72 hours. Thyroid Function Tests: No results for input(s): TSH, T4TOTAL, FREET4, T3FREE, THYROIDAB in the last 72 hours. Anemia Panel: No results for input(s): VITAMINB12, FOLATE, FERRITIN, TIBC, IRON, RETICCTPCT in the last 72 hours. Sepsis Labs: No results for input(s): PROCALCITON, LATICACIDVEN in the last 168 hours.  Recent Results (from the past 240 hour(s))  Resp Panel  by RT-PCR (Flu A&B, Covid) Nasopharyngeal Swab     Status: None   Collection Time: 02/19/21 10:19 AM   Specimen: Nasopharyngeal Swab; Nasopharyngeal(NP) swabs in vial transport medium  Result Value Ref Range Status   SARS Coronavirus 2 by RT PCR NEGATIVE NEGATIVE Final    Comment: (NOTE) SARS-CoV-2 target nucleic acids are NOT DETECTED.  The SARS-CoV-2 RNA is generally detectable in upper respiratory specimens during the acute phase of infection. The lowest concentration of SARS-CoV-2 viral copies this assay can detect is 138 copies/mL. A negative result does not preclude SARS-Cov-2 infection and should not be used as the sole basis for treatment or other patient management decisions. A negative result may occur with  improper specimen collection/handling, submission of specimen other than nasopharyngeal swab, presence of viral mutation(s) within the areas targeted by this assay, and inadequate number of viral copies(<138 copies/mL). A negative result must be combined with clinical observations, patient history, and epidemiological information. The expected result is Negative.  Fact Sheet for Patients:  BloggerCourse.com  Fact Sheet for Healthcare Providers:  SeriousBroker.it  This test is no t yet approved or cleared by the [place] FDA and  has been authorized for detection and/or diagnosis of SARS-CoV-2 by FDA under an Emergency Use Authorization (EUA). This EUA will remain  in effect (meaning this test can be used) for the duration of the COVID-19 declaration under Section 564(b)(1) of the Act, 21 U.S.C.section 360bbb-3(b)(1), unless the authorization is terminated  or revoked sooner.       Influenza A by PCR NEGATIVE NEGATIVE Final   Influenza B by PCR NEGATIVE NEGATIVE Final    Comment: (NOTE) The Xpert Xpress SARS-CoV-2/FLU/RSV plus assay is intended as an aid in the diagnosis of influenza from Nasopharyngeal swab  specimens and should not be used as a sole basis for treatment. Nasal washings and aspirates are unacceptable for Xpert Xpress SARS-CoV-2/FLU/RSV testing.  Fact Sheet for Patients: BloggerCourse.com  Fact Sheet for Healthcare Providers: SeriousBroker.it  This test is not yet approved or cleared by the [place] FDA and has been authorized for detection and/or diagnosis of SARS-CoV-2  by FDA under an Emergency Use Authorization (EUA). This EUA will remain in effect (meaning this test can be used) for the duration of the COVID-19 declaration under Section 564(b)(1) of the Act, 21 U.S.C. section 360bbb-3(b)(1), unless the authorization is terminated or revoked.  Performed at [facility], [address], [place], [place] [address]   Surgical PCR screen     Status: None   Collection Time: 02/20/21 11:58 PM   Specimen: Nasal Mucosa; Nasal Swab  Result Value Ref Range Status   MRSA, PCR NEGATIVE NEGATIVE Final   Staphylococcus aureus NEGATIVE NEGATIVE Final    Comment: (NOTE) The Xpert SA Assay (FDA approved for NASAL specimens in patients 49 years of age and older), is one component of a comprehensive surveillance program. It is not intended to diagnose infection nor to guide or monitor treatment. Performed at [facility], [address], [place], [place] [address]     Radiology Studies: No results found.  Scheduled Meds:  acetaminophen  650 mg Oral Q6H   apixaban  5 mg Oral BID   Chlorhexidine Gluconate Cloth  6 each Topical Daily   clopidogrel  75 mg Oral Q breakfast   mouth rinse  15 mL Mouth Rinse BID   methocarbamol  500 mg Oral TID   metoprolol succinate  50 mg Oral Daily   pantoprazole  40 mg Oral Daily   sodium chloride flush  3 mL Intravenous Q12H   Continuous Infusions:  sodium chloride 50 mL/hr at 02/24/21 1703     LOS: 6 days    Time spent: 25 mins    Nisaiah Bechtol, MD [facility]   If 7PM-7AM, please contact night-coverage

## 2021-02-25 NOTE — Progress Notes (Signed)
Patient ID: Evan Leonard, Evan Leonard   DOB: 1960-07-12, 60 y.o.   MRN: [id number] [facility] Surgery Progress Note  5 Days Post-Op  Subjective: CC-  Comfortable this morning. Still having some mild abdominal discomfort at times. Not taking any narcotic. Tolerating soft diet. BM yesterday.  Objective: Vital signs in last 24 hours: Temp:  [98.2 F (36.8 C)-98.4 F (36.9 C)] 98.2 F (36.8 C) (10/02 0830) Pulse Rate:  [70-80] 73 (10/02 0830) Resp:  [16-19] 19 (10/02 0830) BP: (123-159)/(75-93) 123/75 (10/02 0830) SpO2:  [98 %-100 %] 100 % (10/02 0830) Last BM Date: 02/24/21  Intake/Output from previous day: 10/01 0701 - 10/02 0700 In: 601 [I.V.:601] Out: 2631 [Urine:2450; Drains:180; Stool:1] Intake/Output this shift: No intake/output data recorded.  PE: General: Alert, NAD Lungs:  Respiratory effort nonlabored Abd: soft, appropriately ttp, incision cdi with staples present and no erythema or drainage, drain with SS fluid  Lab Results:  Recent Labs    02/24/21 0101 02/25/21 0115  WBC 11.8* 10.4  HGB 13.0 11.1*  HCT 40.5 34.9*  PLT 232 224   BMET Recent Labs    02/24/21 0101 02/25/21 0115  NA 136 137  K 3.7 3.5  CL 107 107  CO2 20* 23  GLUCOSE 143* 149*  BUN 9 5*  CREATININE 0.81 0.84  CALCIUM 8.4* 8.4*   PT/INR No results for input(s): LABPROT, INR in the last 72 hours. CMP     Component Value Date/Time   NA 137 02/25/2021 0115   K 3.5 02/25/2021 0115   CL 107 02/25/2021 0115   CO2 23 02/25/2021 0115   GLUCOSE 149 (H) 02/25/2021 0115   BUN 5 (L) 02/25/2021 0115   CREATININE 0.84 02/25/2021 0115   CREATININE 0.94 03/06/2020 1623   CALCIUM 8.4 (L) 02/25/2021 0115   PROT 7.3 02/19/2021 0733   ALBUMIN 3.2 (L) 02/19/2021 0733   AST 17 02/19/2021 0733   ALT 20 02/19/2021 0733   ALKPHOS 76 02/19/2021 0733   BILITOT 1.0 02/19/2021 0733   GFRNONAA >60 02/25/2021 0115   GFRAA >60 12/06/2019 1755   Lipase     Component Value Date/Time   LIPASE  23 02/19/2021 0733       Studies/Results: No results found.  Anti-infectives: Anti-infectives (From admission, onward)    Start     Dose/Rate Route Frequency Ordered Stop   02/20/21 1315  ceFAZolin (ANCEF) IVPB 3g/100 mL premix        3 g 200 mL/hr over 30 Minutes Intravenous  Once 02/20/21 1300 02/20/21 1326   02/20/21 1302  ceFAZolin (ANCEF) 3-0.9 GM/100ML-% IVPB       Note to Pharmacy: Mendel Corning   : cabinet override      02/20/21 1302 02/20/21 1342   02/20/21 1252  ceFAZolin (ANCEF) 2-4 GM/100ML-% IVPB  Status:  Discontinued       Note to Pharmacy: Cameron Sprang   : cabinet override      02/20/21 1252 02/20/21 1334        Assessment/Plan POD5 S/P exploratory laparotomy, recurrent hernia repair with mesh, small bowel resection 9/27 Dr. Kieth Brightly - tolerating soft diet and having bowel function - continue to mobilize, PT - continue JP drain and monitor output - plan to d/c home with drain - Patient states that he is aiming for discharge tomorrow per primary team.   FEN: soft diet  VTE: eliquis and plavix restarted 10/1. Hgb 11.1 from 13, monitor ID: Ancef periop   CAD s/p Recent cardiac stent placement  01/02/21  HTN HLD Hx of DVT/PE 2013 T2DM Peripheral vascular disease OSA  Morbid obesity - BMI 42.2   LOS: 6 days    Wellington Hampshire, [facility] Surgery 02/25/2021, 10:04 AM Please see Amion for pager number during day hours 7:00am-4:30pm

## 2021-02-26 DIAGNOSIS — K56609 Unspecified intestinal obstruction, unspecified as to partial versus complete obstruction: Secondary | ICD-10-CM | POA: Diagnosis not present

## 2021-02-26 LAB — BASIC METABOLIC PANEL
Anion gap: 8 (ref 5–15)
BUN: 5 mg/dL — ABNORMAL LOW (ref 8–23)
CO2: 22 mmol/L (ref 22–32)
Calcium: 8.4 mg/dL — ABNORMAL LOW (ref 8.9–10.3)
Chloride: 107 mmol/L (ref 98–111)
Creatinine, Ser: 0.95 mg/dL (ref 0.61–1.24)
GFR, Estimated: >60 mL/min (ref >60)
Glucose, Bld: 152 mg/dL — ABNORMAL HIGH (ref 70–99)
Potassium: 3.8 mmol/L (ref 3.5–5.1)
Sodium: 137 mmol/L (ref 135–145)

## 2021-02-26 LAB — CBC
HCT: 35.2 % — ABNORMAL LOW (ref 39.0–52.0)
Hemoglobin: 11 g/dL — ABNORMAL LOW (ref 13.0–17.0)
MCH: 28.7 pg (ref 26.0–34.0)
MCHC: 31.3 g/dL (ref 30.0–36.0)
MCV: 91.9 fL (ref 80.0–100.0)
Platelets: 253 10*3/uL (ref 150–400)
RBC: 3.83 MIL/uL — ABNORMAL LOW (ref 4.22–5.81)
RDW: 14.6 % (ref 11.5–15.5)
WBC: 10.4 10*3/uL (ref 4.0–10.5)
nRBC: 0 % (ref 0.0–0.2)

## 2021-02-26 LAB — GLUCOSE, CAPILLARY: Glucose-Capillary: 130 mg/dL — ABNORMAL HIGH (ref 70–99)

## 2021-02-26 MED ORDER — METHOCARBAMOL 500 MG PO TABS: 500.0000 mg | ORAL_TABLET | Freq: Three times a day (TID) | ORAL | 0 refills | Status: DC

## 2021-02-26 NOTE — Progress Notes (Signed)
Physical Therapy Treatment & Discharge Patient Details Name: Evan Leonard MRN: [id number] DOB: [birth year] Today's Date: 02/26/2021   History of Present Illness Pt is a 60 y.o. male admitted 02/19/21 with abdominal pain, nausea. CT showed complex ventral hernia with incarcerated small bowel and high-grade SBO. S/p ex-lap, hernia repair, small bowel resection on 9/27. PMH includes CAD (s/p recent stent placement 12/2020), DM2, R foot ulcer, DVT/PE, HTN, PVD, SBO, OSA on CPAP, obesity.   PT Comments    Pt progressing with mobility. Today's session focused on transfer and gait training without DME; pt mod indep for increased time. Reviewed educ re: precautions, positioning, activity recommendations, importance of mobility. Pt has met short-term PT goals, reports no further questions or concerns. Will d/c acute PT.    Recommendations for follow up therapy are one component of a multi-disciplinary discharge planning process, led by the attending physician.  Recommendations may be updated based on patient status, additional functional criteria and insurance authorization.  Follow Up Recommendations  No PT follow up     Equipment Recommendations  None recommended by PT    Recommendations for Other Services       Precautions / Restrictions Precautions Precautions: Fall;Other (comment) Precaution Comments: abdominal JP drain, abdominal binder (per PA - to wear binder for 4-6 weeks "when up and moving")     Mobility  Bed Mobility               General bed mobility comments: Received sitting EOB; pt declined practicing log roll/bed mobility, reports no concerns with bed mobility    Transfers Overall transfer level: Independent Equipment used: None Transfers: Sit to/from Stand              Ambulation/Gait Ambulation/Gait assistance: Modified independent (Device/Increase time) Gait Distance (Feet): 520 Feet Assistive device: IV Pole;None Gait Pattern/deviations:  Step-through pattern;Decreased stride length;Trunk flexed Gait velocity: Decreased   General Gait Details: Slow, steady gait mod indep with and without pushing IV pole   Stairs             Wheelchair Mobility    Modified Rankin (Stroke Patients Only)       Balance Overall balance assessment: Needs assistance Sitting-balance support: No upper extremity supported;Feet supported Sitting balance-Leahy Scale: Good     Standing balance support: During functional activity;No upper extremity supported;Single extremity supported Standing balance-Leahy Scale: Good                              Cognition Arousal/Alertness: Awake/alert Behavior During Therapy: WFL for tasks assessed/performed Overall Cognitive Status: Within Functional Limits for tasks assessed                                        Exercises      General Comments General comments (skin integrity, edema, etc.): Reviewed educ re: abdominal and JP drain precautions, positioning, activity recommendations (including walking program), importance of mobility      Pertinent Vitals/Pain Pain Assessment: Faces Faces Pain Scale: Hurts a little bit Pain Location: abdomen Pain Descriptors / Indicators: Sore Pain Intervention(s): Monitored during session    Home Living                      Prior Function            PT Goals (current goals can now be  found in the care plan section) Progress towards PT goals: Progressing toward goals    Frequency    Min 3X/week      PT Plan Current plan remains appropriate    Co-evaluation              AM-PAC PT "6 Clicks" Mobility   Outcome Measure  Help needed turning from your back to your side while in a flat bed without using bedrails?: None Help needed moving from lying on your back to sitting on the side of a flat bed without using bedrails?: None Help needed moving to and from a bed to a chair (including a  wheelchair)?: None Help needed standing up from a chair using your arms (e.g., wheelchair or bedside chair)?: None Help needed to walk in hospital room?: None Help needed climbing 3-5 steps with a railing? : A Little 6 Click Score: 23    End of Session   Activity Tolerance: Patient tolerated treatment well Patient left: in chair;with call bell/phone within reach (with NT for washup) Nurse Communication: Mobility status PT Visit Diagnosis: Other abnormalities of gait and mobility (R26.89);Pain     Time: 1003-1016 PT Time Calculation (min) (ACUTE ONLY): 13 min  Charges:  $Gait Training: 8-22 mins                     Mabeline Caras, PT, DPT Acute Rehabilitation Services  Pager [phone] Office [phone]  Derry Lory 02/26/2021, 12:29 PM

## 2021-02-26 NOTE — TOC Progression Note (Signed)
Transition of Care [facility]) - Progression Note    Patient Details  Name: Evan Leonard MRN: [id number] Date of Birth: November 20, 1960  Transition of Care [facility]) CM/SW Contact  [name], RN Phone Number: 02/26/2021, 3:28 PM  Clinical Narrative:    Per Loree Fee with [employer], she is the Case Manager there and would like to have the discharge summary faxed to her at [phone].         Expected Discharge Plan and Services           Expected Discharge Date: 02/26/21                                     Social Determinants of Health (SDOH) Interventions    Readmission Risk Interventions No flowsheet data found.

## 2021-02-26 NOTE — Progress Notes (Signed)
6 Days Post-Op   Subjective/Chief Complaint: Complains of some nausea in the am but no vomiting. Having bm's   Objective: Vital signs in last 24 hours: Temp:  [97.9 F (36.6 C)-98.6 F (37 C)] 98.6 F (37 C) (10/03 0810) Pulse Rate:  [64-77] 64 (10/03 0810) Resp:  [14-20] 19 (10/03 0810) BP: (120-139)/(75-89) 124/89 (10/03 0810) SpO2:  [92 %-100 %] 98 % (10/03 0810) Last BM Date: 02/25/21  Intake/Output from previous day: 10/02 0701 - 10/03 0700 In: 1208.3 [I.V.:1208.3] Out: 1045 [Urine:950; Drains:95] Intake/Output this shift: Total I/O In: -  Out: 425 [Urine:400; Drains:25]  General appearance: alert and cooperative Resp: clear to auscultation bilaterally Cardio: irregularly irregular rhythm GI: soft, mild tenderness. Incision ok. Drain output serosanguinous  Lab Results:  Recent Labs    02/25/21 0115 02/26/21 0101  WBC 10.4 10.4  HGB 11.1* 11.0*  HCT 34.9* 35.2*  PLT 224 253   BMET Recent Labs    02/25/21 0115 02/26/21 0101  NA 137 137  K 3.5 3.8  CL 107 107  CO2 23 22  GLUCOSE 149* 152*  BUN 5* 5*  CREATININE 0.84 0.95  CALCIUM 8.4* 8.4*   PT/INR No results for input(s): LABPROT, INR in the last 72 hours. ABG No results for input(s): PHART, HCO3 in the last 72 hours.  Invalid input(s): PCO2, PO2  Studies/Results: No results found.  Anti-infectives: Anti-infectives (From admission, onward)    Start     Dose/Rate Route Frequency Ordered Stop   02/20/21 1315  ceFAZolin (ANCEF) IVPB 3g/100 mL premix        3 g 200 mL/hr over 30 Minutes Intravenous  Once 02/20/21 1300 02/20/21 1326   02/20/21 1302  ceFAZolin (ANCEF) 3-0.9 GM/100ML-% IVPB       Note to Pharmacy: Evan Leonard   : cabinet override      02/20/21 1302 02/20/21 1342   02/20/21 1252  ceFAZolin (ANCEF) 2-4 GM/100ML-% IVPB  Status:  Discontinued       Note to Pharmacy: Cameron Sprang   : cabinet override      02/20/21 1252 02/20/21 1334       Assessment/Plan: s/p  Procedure(s): EXPLORATORY LAPAROTOMY; RECURRENT HERNIA REPAIR WITH OVERLAY MESH (N/A) SMALL BOWEL RESECTION (N/A) LYSIS OF ADHESION Advance diet POD6 S/P exploratory laparotomy, recurrent hernia repair with mesh, small bowel resection 9/27 Dr. Kieth Brightly - tolerating soft diet and having bowel function - continue to mobilize, PT - continue JP drain and monitor output - plan to d/c home with drain - Patient states that he is aiming for discharge tomorrow per primary team.   FEN: soft diet  VTE: eliquis and plavix restarted 10/1. Hgb 11.1 from 13, monitor ID: Ancef periop   CAD s/p Recent cardiac stent placement 01/02/21  HTN HLD Hx of DVT/PE 2013 T2DM Peripheral vascular disease OSA  Morbid obesity - BMI 42.2  LOS: 7 days    Evan Leonard 02/26/2021

## 2021-02-26 NOTE — Discharge Summary (Signed)
Physician Discharge Summary  Evan Leonard YQM:[id number] DOB: 02-Jul-1960 DOA: 02/19/2021  PCP: [name], MD  Admit date: 02/19/2021  Discharge date: 02/26/2021  Admitted From: Home.  Disposition: Home  Recommendations for Outpatient Follow-up:  Follow up with PCP in 1-2 weeks. Please obtain BMP/CBC in one week. Advised to follow-up with general surgery in 1 to 2 weeks.   Patient is being discharged home with a drain. Patient is s/p exploratory laparotomy.  Home Health: None Equipment/Devices: Abdominal drain  Discharge Condition: Stable.  CODE STATUS:Full code  Diet recommendation: Regular   Brief Summary / Hospital Course: This 60 y.o. male with PMH significant for essential hypertension, history of DVT on Eliquis, diabetes mellitus type 2,  morbid obesity presented with 2-day history of abdominal pain and nausea which has progressively gotten worse.  CT A/P showed significant large and complex multilocular ventral hernia with incarcerated small bowel and high-grade small bowel obstruction.General surgery was consulted who recommended NG tube placement to intermittent suction. Patient underwent exploratory laparotomy,  tolerated well. POD 6.  Patient started on clear liquid diet,  tolerated well. Patient up and ambulating and passing flatus,  advanced diet to full liquids to soft.  Patient tolerated soft bland diet, patient cleared from general surgery to be discharged.  Patient is being discharged home with the drain and it will be followed outpatient.  Patient feels better and want to be discharged.  He was managed for below problems.  Discharge Diagnoses:  Principal Problem:   SBO (small bowel obstruction) (HCC) Active Problems:   Morbid obesity with body mass index of 40.0-44.9 in adult [facility])   Obstructive sleep apnea   Leukocytosis   Diabetes mellitus with hyperglycemia (HCC)   Incarcerated ventral hernia   History of pulmonary embolism   S/P repair of  recurrent ventral hernia   CAD (coronary artery disease)   Chronic anticoagulation   History of DVT (deep vein thrombosis)   Pressure injury of skin  Small bowel obstruction with incarcerated ventral hernia: Patient presented with abdominal pain associated with nausea and vomiting. CT A/P showed large and complex multilocular ventral hernia with incarcerated small bowel and high-grade small bowel obstruction.   General surgery consulted,  NG tube in place to intermittent suction. Adequate pain control. Kept NPO Patient underwent exploratory laparotomy, recurrent hernia repair with overlay mesh. Small bowel resection and lysis of adhesion Postoperative day 6 , NGT was clamped and removed.  Patient started on clear liquid diet. Patient is able to ambulate and able to pass flatus. Patient has been having bowel movements. Diet advanced to soft bland diet.  Tolerated diet but denies nausea and vomiting. Patient is being discharged home.   CAD: Status post stent to the RCA last month due to an NSTEMI. At that time 2D echo showed an EF of 55% with grade 1 diastolic heart failure.   Continue statin, Plavix   History of PE and DVT on chronic anticoagulation: Heparin discontinued, resumed Eliquis. Hemoglobin remained stable.   Essential hypertension: Continue metoprolol 50 mg daily.   Diabetes mellitus type 2 uncontrolled: Hemoglobin A1c 9.5.   Continue sliding scale insulin. CBGs every 4 hr.   GERD: Continue Protonix  History of repair of ventral hernia: S/p exploratory laparotomy.   Morbid obesity: Counseled on diet and exercise.   Obstructive sleep apnea: CPAP at night.  Leukocytosis: > Resolved. Likely reactive due to small bowel obstruction now improved.   He remained afebrile, continue monitor fever curve.    Discharge Instructions  Discharge Instructions     Call MD for:  difficulty breathing, headache or visual disturbances   Complete by: As directed    Call MD  for:  persistant dizziness or light-headedness   Complete by: As directed    Call MD for:  persistant nausea and vomiting   Complete by: As directed    Diet - low sodium heart healthy   Complete by: As directed    Diet - low sodium heart healthy   Complete by: As directed    Discharge instructions   Complete by: As directed    Advised to follow-up with primary care physician in 1 week. Advised to follow-up with general surgery in 1 to 2 weeks.   Patient is being discharged home with a drain. Patient is a s/p exploratory laparotomy.   Discharge wound care:   Complete by: As directed    Follow up PCP   Discharge wound care:   Complete by: As directed    Follow-up general surgery.   Increase activity slowly   Complete by: As directed    Increase activity slowly   Complete by: As directed       Allergies as of 02/26/2021   Not on File      Medication List     STOP taking these medications    promethazine-dextromethorphan 6.25-15 MG/5ML syrup Commonly known as: PROMETHAZINE-DM   triamcinolone ointment 0.5 % Commonly known as: KENALOG       TAKE these medications    Accu-Chek Guide test strip Generic drug: glucose blood USE AS DIRECTED UP TO 4 TIMES DAILY What changed: See the new instructions.   acetaminophen 500 MG tablet Commonly known as: TYLENOL Take 500-1,000 mg by mouth every 6 (six) hours as needed for mild pain or headache.   atorvastatin 80 MG tablet Commonly known as: LIPITOR Take 1 tablet (80 mg total) by mouth daily.   blood glucose meter kit and supplies Kit Dispense based on patient and insurance preference. Use up to four times daily as directed. E11.65 What changed:  how much to take how to take this when to take this   Centrum Silver 50+Men Tabs Take 1 tablet by mouth daily with breakfast.   clopidogrel 75 MG tablet Commonly known as: PLAVIX Take 1 tablet (75 mg total) by mouth daily with breakfast.   Eliquis 5 MG Tabs  tablet Generic drug: apixaban TAKE 1 TABLET BY MOUTH TWICE A DAY What changed:  how much to take when to take this   empagliflozin 10 MG Tabs tablet Commonly known as: Jardiance Take 1 tablet (10 mg total) by mouth daily.   metFORMIN 500 MG 24 hr tablet Commonly known as: Glucophage XR Take 2 tablets (1,000 mg total) by mouth in the morning and at bedtime. Restart on 8/12 What changed: additional instructions   methocarbamol 500 MG tablet Commonly known as: ROBAXIN Take 1 tablet (500 mg total) by mouth 3 (three) times daily.   metoprolol succinate 50 MG 24 hr tablet Commonly known as: TOPROL-XL Take 1 tablet (50 mg total) by mouth daily.   nitroGLYCERIN 0.4 MG SL tablet Commonly known as: NITROSTAT Place 1 tablet (0.4 mg total) under the tongue every 5 (five) minutes as needed for chest pain.   Ozempic (1 MG/DOSE) 2 MG/1.5ML Sopn Generic drug: Semaglutide (1 MG/DOSE) Inject 1 mg into the skin once a week.   pantoprazole 40 MG tablet Commonly known as: PROTONIX Take 1 tablet (40 mg total) by mouth daily. What changed:  when to take this   PRESCRIPTION MEDICATION Inhale 1 Device into the lungs See admin instructions. CPAP- At bedtime every night               Discharge Care Instructions  (From admission, onward)           Start     Ordered   02/26/21 0000  Discharge wound care:       Comments: Follow up PCP   02/26/21 0950   02/26/21 0000  Discharge wound care:       Comments: Follow-up general surgery.   02/26/21 1040            Follow-up Information     Sur[facility], [place]. Go on 03/06/2021.   Specialty: General Surgery Why: RN visit for staple and drain removal scheduled for 2:00 PM. Please arrive 30 min prior to appointment time. Bring photo ID and insurance information. Contact information: [address] [place] [address] [phone]         Kinsinger, Arta Bruce, MD. Go on 03/15/2021.   Specialty: General  Surgery Why: Follow up appointment scheduled for 10:50 AM. Please arrive 20 minutes prior to appointment time. Bring photo ID and insurance information with you. Contact information: [address] [place] [address] [phone]         [name], MD Follow up in 1 week(s).   Specialty: Family Medicine Contact information: [address] [place] [address] [phone]         Burnell Blanks, MD .   Specialty: Cardiology Contact information: [place] [address] [place] [address] [phone]                Not on File  Consultations: General surgery   Procedures/Studies: CT Abdomen Pelvis W Contrast  Result Date: 02/19/2021 CLINICAL DATA:  60 year old male with mid abdominal pain. Query bowel obstruction. EXAM: CT ABDOMEN AND PELVIS WITH CONTRAST TECHNIQUE: Multidetector CT imaging of the abdomen and pelvis was performed using the standard protocol following bolus administration of intravenous contrast. CONTRAST:  134mL OMNIPAQUE IOHEXOL 350 MG/ML SOLN COMPARISON:  CT Abdomen and Pelvis 08/21/2019 and earlier. FINDINGS: Lower chest: Calcified coronary artery atherosclerosis. No cardiomegaly or pericardial effusion. Chronic left lung base atelectasis. Hepatobiliary: Chronic hepatic steatosis. Tiny layering gallstones suspected. No gallbladder inflammation or bile duct enlargement. Pancreas: Atrophied. Spleen: Stable and negative. Adrenals/Urinary Tract: Normal adrenal glands. Extensive bilateral nephrolithiasis but no hydronephrosis or hydroureter. Mildly distended but otherwise unremarkable bladder. Small exophytic right renal midpole cyst has decreased in size since 2017, benign. Stomach/Bowel: Retained stool in the distal large bowel. Complex chronic ventral abdominal hernia containing both large and small bowel loops and mesentery. The proximal sigmoid and distal descending colon is herniated but not incarcerated.  Upstream large bowel remains normal. There is trace free fluid in the right lower quadrant, but no discrete pericecal inflammation. No enlarged or abnormal appendix is identified. The terminal ileum is decompressed, and distal small bowel exits the large left abdominal hernia on series 3, image 84. Multiple small bowel loops appear incarcerated within the complex hernia in a multiloculated fashion (see coronal image 34). And there is an upstream small bowel obstruction, with the proximal loop entering the hernia on coronal image 42. Upstream fluid-filled and mildly inflamed small bowel loops are individually dilated to 43 mm. The stomach and duodenum are fluid-filled but decompressed. Small-bowel dilates in the proximal to mid jejunum. No free air.  Trace free fluid in the herniated mesentery. Overall the hernia sac measures about 24 mm diameter. Vascular/Lymphatic: Aortoiliac calcified atherosclerosis. Major arterial structures in the abdomen and pelvis are patent. Portal venous system is patent. No lymphadenopathy. Reproductive: Negative. Other: No pelvic free fluid. Musculoskeletal: Osteopenia. Chronic but ununited left posterior 10th rib fracture is new since last year. No acute osseous abnormality identified. IMPRESSION: 1. Large and complex, multilocular-appearing ventral abdominal hernia with incarcerated small bowel and subsequent fairly high-grade small bowel obstruction. Herniated left colon also, but not incarcerated. No free air. Small volume free fluid. 2. Chronic appearing but ununited left 10th rib fracture is new since last year. Chronic hepatic steatosis.  Extensive bilateral nephrolithiasis. Calcified coronary artery atherosclerosis.Aortic Atherosclerosis (ICD10-I70.0). Electronically Signed   By: Genevie Ann M.D.   On: 02/19/2021 09:42   DG Abd Portable 1V  Result Date: 02/20/2021 CLINICAL DATA:  NG tube adjustment EXAM: PORTABLE ABDOMEN - 1 VIEW COMPARISON:  02/20/2021 FINDINGS: NG tube coils in  the stomach. Dilated small bowel loops compatible with small bowel obstruction. IMPRESSION: NG tube coils in the stomach. Small bowel obstruction pattern. Electronically Signed   By: Rolm Baptise M.D.   On: 02/20/2021 08:53   DG Abd Portable 1V  Result Date: 02/20/2021 CLINICAL DATA:  Follow-up small bowel obstruction EXAM: PORTABLE ABDOMEN - 1 VIEW COMPARISON:  Yesterday FINDINGS: Continued gas dilated small bowel loops measuring up to nearly 5 cm in diameter. Dilute contrast is still seen within dilated small bowel loops. Relatively decompressed colon. No concerning mass effect, gas collection, or calcification. IMPRESSION: Continued high-grade small bowel obstruction. Oral contrast is diluted within dilated small bowel loops. Electronically Signed   By: Jorje Guild M.D.   On: 02/20/2021 06:51   DG Abd Portable 1V-Small Bowel Obstruction Protocol-initial, 8 hr delay  Result Date: 02/19/2021 CLINICAL DATA:  Abdominal pain. Evaluate for small bowel obstruction. No bowel movement for 2 days EXAM: PORTABLE ABDOMEN - 1 VIEW COMPARISON:  CT abdomen pelvis 02/19/2021. x-ray abdomen 02/19/2021 8:15 p.m. FINDINGS: Several loops of small bowel are dilated with gas and PO contrast. PO contrast may have reach the ascending colon. IMPRESSION: 1. Small-bowel obstruction. PO contrast may have reach the ascending colon. Recommend attention on follow-up. Electronically Signed   By: Iven Finn M.D.   On: 02/19/2021 23:22   DG Abd Portable 1V  Result Date: 02/19/2021 CLINICAL DATA:  NG tube placement EXAM: PORTABLE ABDOMEN - 1 VIEW COMPARISON:  02/19/2021 at 1942 hours FINDINGS: Enteric tube with its tip at the gastric cardia and side port in the distal esophagus, unchanged. IMPRESSION: Enteric tube with its tip at the gastric cardia and side port in the distal esophagus, unchanged. Electronically Signed   By: Julian Hy M.D.   On: 02/19/2021 20:57   DG Abd Portable 1 View  Result Date:  02/19/2021 CLINICAL DATA:  NG verification EXAM: PORTABLE ABDOMEN - 1 VIEW COMPARISON:  02/19/2021 at 1342 hours FINDINGS: Enteric tube terminates in the gastric cardia with its side port in the distal esophagus. This has migrated proximally since the prior. Contrast in the proximal stomach. IMPRESSION: Enteric tube terminates in the gastric cardia with its side port in the distal esophagus. This has migrated proximally since the prior. Consider advancement as clinically warranted. Electronically Signed   By: Julian Hy M.D.   On: 02/19/2021 20:15   DG Abd Portable 1V-Small Bowel Protocol-Position Verification  Result Date: 02/19/2021 CLINICAL DATA:  Generalized abdominal pain since yesterday.  Nausea. EXAM: PORTABLE ABDOMEN -  1 VIEW COMPARISON:  08/21/2019. FINDINGS: Nasogastric terminates in the stomach. Marked gaseous distention of small bowel. There is some gas seen in the colon. Overall image quality is limited by body habitus. IMPRESSION: Gaseous distention of small bowel with some gas in the colon, findings indicative of an early or partial small bowel obstruction. Electronically Signed   By: Lorin Picket M.D.   On: 02/19/2021 14:21    Exploratory laparotomy   Subjective: Patient was seen and examined at bedside.  Overnight events noted.   Patient reports feeling much improved. Patient ambulating well.   Patient is able to pass flatus and had a bowel movement.  Tolerated diet.  Discharge Exam: Vitals:   02/26/21 0426 02/26/21 0810  BP: 126/85 124/89  Pulse: 64 64  Resp: 19 19  Temp: 98.5 F (36.9 C) 98.6 F (37 C)  SpO2: 98% 98%   Vitals:   02/25/21 1947 02/26/21 0038 02/26/21 0426 02/26/21 0810  BP: 120/80 121/75 126/85 124/89  Pulse: 77 67 64 64  Resp: $Remo'20 20 19 19  'nDeuE$ Temp: 98.3 F (36.8 C) 98.3 F (36.8 C) 98.5 F (36.9 C) 98.6 F (37 C)  TempSrc: Oral Oral Oral Oral  SpO2: 100% 92% 98% 98%  Weight:      Height:        General: Appears comfortable, not in any  acute distress. Cardiovascular: Regular rate and rhythm, S1-S2 heard, no murmur Respiratory: CTA bilaterally, no wheezing, no rhonchi Abdominal: Soft, nondistended, nontender, staples noted in the midline, BS+ Extremities: no edema, no cyanosis    The results of significant diagnostics from this hospitalization (including imaging, microbiology, ancillary and laboratory) are listed below for reference.     Microbiology: Recent Results (from the past 240 hour(s))  Resp Panel by RT-PCR (Flu A&B, Covid) Nasopharyngeal Swab     Status: None   Collection Time: 02/19/21 10:19 AM   Specimen: Nasopharyngeal Swab; Nasopharyngeal(NP) swabs in vial transport medium  Result Value Ref Range Status   SARS Coronavirus 2 by RT PCR NEGATIVE NEGATIVE Final    Comment: (NOTE) SARS-CoV-2 target nucleic acids are NOT DETECTED.  The SARS-CoV-2 RNA is generally detectable in upper respiratory specimens during the acute phase of infection. The lowest concentration of SARS-CoV-2 viral copies this assay can detect is 138 copies/mL. A negative result does not preclude SARS-Cov-2 infection and should not be used as the sole basis for treatment or other patient management decisions. A negative result may occur with  improper specimen collection/handling, submission of specimen other than nasopharyngeal swab, presence of viral mutation(s) within the areas targeted by this assay, and inadequate number of viral copies(<138 copies/mL). A negative result must be combined with clinical observations, patient history, and epidemiological information. The expected result is Negative.  Fact Sheet for Patients:  EntrepreneurPulse.com.au  Fact Sheet for Healthcare Providers:  IncredibleEmployment.be  This test is no t yet approved or cleared by the [place] FDA and  has been authorized for detection and/or diagnosis of SARS-CoV-2 by FDA under an Emergency Use Authorization  (EUA). This EUA will remain  in effect (meaning this test can be used) for the duration of the COVID-19 declaration under Section 564(b)(1) of the Act, 21 U.S.C.section 360bbb-3(b)(1), unless the authorization is terminated  or revoked sooner.       Influenza A by PCR NEGATIVE NEGATIVE Final   Influenza B by PCR NEGATIVE NEGATIVE Final    Comment: (NOTE) The Xpert Xpress SARS-CoV-2/FLU/RSV plus assay is intended as an aid in the  diagnosis of influenza from Nasopharyngeal swab specimens and should not be used as a sole basis for treatment. Nasal washings and aspirates are unacceptable for Xpert Xpress SARS-CoV-2/FLU/RSV testing.  Fact Sheet for Patients: BloggerCourse.com  Fact Sheet for Healthcare Providers: SeriousBroker.it  This test is not yet approved or cleared by the [place] FDA and has been authorized for detection and/or diagnosis of SARS-CoV-2 by FDA under an Emergency Use Authorization (EUA). This EUA will remain in effect (meaning this test can be used) for the duration of the COVID-19 declaration under Section 564(b)(1) of the Act, 21 U.S.C. section 360bbb-3(b)(1), unless the authorization is terminated or revoked.  Performed at [facility], [address] [place], [place] [address]   Surgical PCR screen     Status: None   Collection Time: 02/20/21 11:58 PM   Specimen: Nasal Mucosa; Nasal Swab  Result Value Ref Range Status   MRSA, PCR NEGATIVE NEGATIVE Final   Staphylococcus aureus NEGATIVE NEGATIVE Final    Comment: (NOTE) The Xpert SA Assay (FDA approved for NASAL specimens in patients 19 years of age and older), is one component of a comprehensive surveillance program. It is not intended to diagnose infection nor to guide or monitor treatment. Performed at [facility], [address] [place], [place] [address]      Labs: BNP (last 3 results) Recent Labs    12/11/20 1728  01/01/21 0654  BNP 30.1 634.1*   Basic Metabolic Panel: Recent Labs  Lab 02/21/21 0818 02/23/21 0102 02/24/21 0101 02/25/21 0115 02/26/21 0101  NA 140 137 136 137 137  K 4.7 3.5 3.7 3.5 3.8  CL 108 106 107 107 107  CO2 22 23 20* 23 22  GLUCOSE 142* 132* 143* 149* 152*  BUN 16 11 9  5* 5*  CREATININE 1.10 0.92 0.81 0.84 0.95  CALCIUM 8.4* 8.3* 8.4* 8.4* 8.4*  MG  --   --  1.7  --   --   PHOS  --   --  2.0*  --   --    Liver Function Tests: No results for input(s): AST, ALT, ALKPHOS, BILITOT, PROT, ALBUMIN in the last 168 hours. No results for input(s): LIPASE, AMYLASE in the last 168 hours. No results for input(s): AMMONIA in the last 168 hours. CBC: Recent Labs  Lab 02/22/21 0059 02/23/21 0102 02/24/21 0101 02/25/21 0115 02/26/21 0101  WBC 15.3* 15.2* 11.8* 10.4 10.4  HGB 13.0 11.6* 13.0 11.1* 11.0*  HCT 42.0 37.2* 40.5 34.9* 35.2*  MCV 93.3 92.3 90.8 90.9 91.9  PLT 233 206 232 224 253   Cardiac Enzymes: No results for input(s): CKTOTAL, CKMB, CKMBINDEX, TROPONINI in the last 168 hours. BNP: Invalid input(s): POCBNP CBG: Recent Labs  Lab 02/22/21 1354 02/23/21 0646 02/24/21 0615 02/25/21 0632 02/26/21 0618  GLUCAP 122* 134* 127* 147* 130*   D-Dimer No results for input(s): DDIMER in the last 72 hours. Hgb A1c No results for input(s): HGBA1C in the last 72 hours. Lipid Profile No results for input(s): CHOL, HDL, LDLCALC, TRIG, CHOLHDL, LDLDIRECT in the last 72 hours. Thyroid function studies No results for input(s): TSH, T4TOTAL, T3FREE, THYROIDAB in the last 72 hours.  Invalid input(s): FREET3 Anemia work up No results for input(s): VITAMINB12, FOLATE, FERRITIN, TIBC, IRON, RETICCTPCT in the last 72 hours. Urinalysis    Component Value Date/Time   COLORURINE YELLOW 02/19/2021 1200   APPEARANCEUR CLEAR 02/19/2021 1200   LABSPEC >1.046 (H) 02/19/2021 1200   PHURINE 5.0 02/19/2021 1200  GLUCOSEU >=500 (A) 02/19/2021 1200   HGBUR NEGATIVE  02/19/2021 1200   BILIRUBINUR NEGATIVE 02/19/2021 1200   BILIRUBINUR negative 08/18/2019 1045   KETONESUR 20 (A) 02/19/2021 1200   PROTEINUR NEGATIVE 02/19/2021 1200   UROBILINOGEN 0.2 12/20/2020 1727   NITRITE NEGATIVE 02/19/2021 1200   LEUKOCYTESUR NEGATIVE 02/19/2021 1200   Sepsis Labs Invalid input(s): PROCALCITONIN,  WBC,  LACTICIDVEN Microbiology Recent Results (from the past 240 hour(s))  Resp Panel by RT-PCR (Flu A&B, Covid) Nasopharyngeal Swab     Status: None   Collection Time: 02/19/21 10:19 AM   Specimen: Nasopharyngeal Swab; Nasopharyngeal(NP) swabs in vial transport medium  Result Value Ref Range Status   SARS Coronavirus 2 by RT PCR NEGATIVE NEGATIVE Final    Comment: (NOTE) SARS-CoV-2 target nucleic acids are NOT DETECTED.  The SARS-CoV-2 RNA is generally detectable in upper respiratory specimens during the acute phase of infection. The lowest concentration of SARS-CoV-2 viral copies this assay can detect is 138 copies/mL. A negative result does not preclude SARS-Cov-2 infection and should not be used as the sole basis for treatment or other patient management decisions. A negative result may occur with  improper specimen collection/handling, submission of specimen other than nasopharyngeal swab, presence of viral mutation(s) within the areas targeted by this assay, and inadequate number of viral copies(<138 copies/mL). A negative result must be combined with clinical observations, patient history, and epidemiological information. The expected result is Negative.  Fact Sheet for Patients:  EntrepreneurPulse.com.au  Fact Sheet for Healthcare Providers:  IncredibleEmployment.be  This test is no t yet approved or cleared by the [place] FDA and  has been authorized for detection and/or diagnosis of SARS-CoV-2 by FDA under an Emergency Use Authorization (EUA). This EUA will remain  in effect (meaning this test can be used)  for the duration of the COVID-19 declaration under Section 564(b)(1) of the Act, 21 U.S.C.section 360bbb-3(b)(1), unless the authorization is terminated  or revoked sooner.       Influenza A by PCR NEGATIVE NEGATIVE Final   Influenza B by PCR NEGATIVE NEGATIVE Final    Comment: (NOTE) The Xpert Xpress SARS-CoV-2/FLU/RSV plus assay is intended as an aid in the diagnosis of influenza from Nasopharyngeal swab specimens and should not be used as a sole basis for treatment. Nasal washings and aspirates are unacceptable for Xpert Xpress SARS-CoV-2/FLU/RSV testing.  Fact Sheet for Patients: EntrepreneurPulse.com.au  Fact Sheet for Healthcare Providers: IncredibleEmployment.be  This test is not yet approved or cleared by the [place] FDA and has been authorized for detection and/or diagnosis of SARS-CoV-2 by FDA under an Emergency Use Authorization (EUA). This EUA will remain in effect (meaning this test can be used) for the duration of the COVID-19 declaration under Section 564(b)(1) of the Act, 21 U.S.C. section 360bbb-3(b)(1), unless the authorization is terminated or revoked.  Performed at [facility], [place] [address] [place], [place] [address]   Surgical PCR screen     Status: None   Collection Time: 02/20/21 11:58 PM   Specimen: Nasal Mucosa; Nasal Swab  Result Value Ref Range Status   MRSA, PCR NEGATIVE NEGATIVE Final   Staphylococcus aureus NEGATIVE NEGATIVE Final    Comment: (NOTE) The Xpert SA Assay (FDA approved for NASAL specimens in patients 68 years of age and older), is one component of a comprehensive surveillance program. It is not intended to diagnose infection nor to guide or monitor treatment. Performed at [facility], [place] [address] [place], [place] [address]  Time coordinating discharge: Over 30 minutes  SIGNED:   Shawna Clamp, MD  [facility] 02/26/2021, 4:34 PM Pager   If  7PM-7AM, please contact night-coverage

## 2021-02-26 NOTE — Plan of Care (Signed)
Problem: Education: Goal: Required Educational Video(s) 02/26/2021 1058 by Lurline Idol, RN Outcome: Adequate for Discharge 02/26/2021 1058 by Lurline Idol, RN Outcome: Adequate for Discharge   Problem: Clinical Measurements: Goal: Ability to maintain clinical measurements within normal limits will improve 02/26/2021 1058 by Lurline Idol, RN Outcome: Adequate for Discharge 02/26/2021 1058 by Lurline Idol, RN Outcome: Adequate for Discharge Goal: Postoperative complications will be avoided or minimized 02/26/2021 1058 by Lurline Idol, RN Outcome: Adequate for Discharge 02/26/2021 1058 by Lurline Idol, RN Outcome: Adequate for Discharge   Problem: Skin Integrity: Goal: Demonstration of wound healing without infection will improve 02/26/2021 1058 by Lurline Idol, RN Outcome: Adequate for Discharge 02/26/2021 1058 by Lurline Idol, RN Outcome: Adequate for Discharge   Problem: Education: Goal: Knowledge of General Education information will improve Description: Including pain rating scale, medication(s)/side effects and non-pharmacologic comfort measures 02/26/2021 1058 by Lurline Idol, RN Outcome: Adequate for Discharge 02/26/2021 1058 by Lurline Idol, RN Outcome: Adequate for Discharge   Problem: Health Behavior/Discharge Planning: Goal: Ability to manage health-related needs will improve 02/26/2021 1058 by Lurline Idol, RN Outcome: Adequate for Discharge 02/26/2021 1058 by Lurline Idol, RN Outcome: Adequate for Discharge   Problem: Clinical Measurements: Goal: Ability to maintain clinical measurements within normal limits will improve 02/26/2021 1058 by Lurline Idol, RN Outcome: Adequate for Discharge 02/26/2021 1058 by Lurline Idol, RN Outcome: Adequate for Discharge Goal: Will remain free from infection 02/26/2021 1058 by Lurline Idol, RN Outcome: Adequate for Discharge 02/26/2021 1058 by Lurline Idol, RN Outcome: Adequate for Discharge Goal:  Diagnostic test results will improve 02/26/2021 1058 by Lurline Idol, RN Outcome: Adequate for Discharge 02/26/2021 1058 by Lurline Idol, RN Outcome: Adequate for Discharge Goal: Respiratory complications will improve 02/26/2021 1058 by Lurline Idol, RN Outcome: Adequate for Discharge 02/26/2021 1058 by Lurline Idol, RN Outcome: Adequate for Discharge Goal: Cardiovascular complication will be avoided 02/26/2021 1058 by Lurline Idol, RN Outcome: Adequate for Discharge 02/26/2021 1058 by Lurline Idol, RN Outcome: Adequate for Discharge   Problem: Activity: Goal: Risk for activity intolerance will decrease 02/26/2021 1058 by Lurline Idol, RN Outcome: Adequate for Discharge 02/26/2021 1058 by Lurline Idol, RN Outcome: Adequate for Discharge   Problem: Nutrition: Goal: Adequate nutrition will be maintained 02/26/2021 1058 by Lurline Idol, RN Outcome: Adequate for Discharge 02/26/2021 1058 by Lurline Idol, RN Outcome: Adequate for Discharge   Problem: Coping: Goal: Level of anxiety will decrease 02/26/2021 1058 by Lurline Idol, RN Outcome: Adequate for Discharge 02/26/2021 1058 by Lurline Idol, RN Outcome: Adequate for Discharge   Problem: Elimination: Goal: Will not experience complications related to bowel motility 02/26/2021 1058 by Lurline Idol, RN Outcome: Adequate for Discharge 02/26/2021 1058 by Lurline Idol, RN Outcome: Adequate for Discharge Goal: Will not experience complications related to urinary retention 02/26/2021 1058 by Lurline Idol, RN Outcome: Adequate for Discharge 02/26/2021 1058 by Lurline Idol, RN Outcome: Adequate for Discharge   Problem: Pain Managment: Goal: General experience of comfort will improve 02/26/2021 1058 by Lurline Idol, RN Outcome: Adequate for Discharge 02/26/2021 1058 by Lurline Idol, RN Outcome: Adequate for Discharge   Problem: Safety: Goal: Ability to remain free from injury will improve 02/26/2021 1058  by Lurline Idol, RN Outcome: Adequate for Discharge 02/26/2021 1058 by Lurline Idol, RN Outcome: Adequate for Discharge   Problem: Skin  Integrity: Goal: Risk for impaired skin integrity will decrease 02/26/2021 1058 by Lurline Idol, RN Outcome: Adequate for Discharge 02/26/2021 1058 by Lurline Idol, RN Outcome: Adequate for Discharge   Problem: Acute Rehab OT Goals (only OT should resolve) Goal: Pt. Will Perform Lower Body Dressing Outcome: Adequate for Discharge Goal: Pt. Will Transfer To Toilet Outcome: Adequate for Discharge Goal: Pt/Caregiver Will Perform Home Exercise Program Outcome: Adequate for Discharge Goal: OT Additional ADL Goal #1 Outcome: Adequate for Discharge   Problem: Acute Rehab PT Goals(only PT should resolve) Goal: Pt Will Go Supine/Side To Sit Outcome: Adequate for Discharge Goal: Patient Will Transfer Sit To/From Stand Outcome: Adequate for Discharge Goal: Pt Will Ambulate Outcome: Adequate for Discharge

## 2021-02-26 NOTE — Progress Notes (Addendum)
Occupational Therapy Treatment/Discharge Patient Details Name: Evan Leonard MRN: [id number] DOB: February 15, 1961 Today's Date: 02/26/2021   History of present illness Pt is a 60 y.o. male admitted 02/19/21 with abdominal pain, nausea. CT showed complex ventral hernia with incarcerated small bowel and high-grade SBO. S/p ex-lap, hernia repair, small bowel resection on 9/27. PMH includes CAD (s/p recent stent placement 12/2020), DM2, R foot ulcer, DVT/PE, HTN, PVD, SBO, OSA on CPAP, obesity.   OT comments  Pt preparing for DC home today. Session focused on dressing tasks in prep for DC with pt able to manage LB dressing tasks Independently and improved ability to reach B feet. Pt able to mobilize in room without AD independently without safety concerns. Education re: drain mgmt with clothing/activity at home, gradual progression of endurance, abdominal binder mgmt with pt verbalizing understanding. DC recs remain appropriate with no further skilled OT services needed at this time.   Recommendations for follow up therapy are one component of a multi-disciplinary discharge planning process, led by the attending physician.  Recommendations may be updated based on patient status, additional functional criteria and insurance authorization.    Follow Up Recommendations  No OT follow up    Equipment Recommendations  None recommended by OT    Recommendations for Other Services      Precautions / Restrictions Precautions Precautions: Fall;Other (comment) Precaution Comments: abdominal JP drain, abdominal binder (per PA - to wear binder for 4-6 weeks "when up and moving") Restrictions Weight Bearing Restrictions: No       Mobility Bed Mobility               General bed mobility comments: received sitting EOB    Transfers Overall transfer level: Independent Equipment used: None Transfers: Sit to/from Stand Sit to Stand: Independent              Balance Overall balance  assessment: Needs assistance Sitting-balance support: No upper extremity supported;Feet supported Sitting balance-Leahy Scale: Good     Standing balance support: During functional activity;No upper extremity supported;Single extremity supported Standing balance-Leahy Scale: Good Standing balance comment: no use of AD for mobility                           ADL either performed or assessed with clinical judgement   ADL Overall ADL's : Independent                 Upper Body Dressing : Independent;Sitting   Lower Body Dressing: Independent;Sit to/from stand               Functional mobility during ADLs: Independent General ADL Comments: on entry, pt dressing for discharge. Improved ability to reach B feet (L LE gave pt the most trouble on eval) via figure four position. Pt verbalized fully how to manage JP drain at home and collaborated on strategies to manage it with certain clothing. Pt able to complete entirety of dressing tasks and mobility in room without AD Independently. Pt verbalized comfort with managing abdominal binder     Vision   Vision Assessment?: No apparent visual deficits   Perception     Praxis      Cognition Arousal/Alertness: Awake/alert Behavior During Therapy: WFL for tasks assessed/performed Overall Cognitive Status: Within Functional Limits for tasks assessed  General Comments: very pleasant and participatory        Exercises     Shoulder Instructions       General Comments Reviewed educ re: abdominal and JP drain precautions, positioning, activity recommendations (including walking program), importance of mobility    Pertinent Vitals/ Pain       Pain Assessment: No/denies pain Faces Pain Scale: Hurts a little bit Pain Location: abdomen Pain Descriptors / Indicators: Sore Pain Intervention(s): Monitored during session  Home Living                                           Prior Functioning/Environment              Frequency  Min 2X/week        Progress Toward Goals  OT Goals(current goals can now be found in the care plan section)  Progress towards OT goals: Progressing toward goals  Acute Rehab OT Goals Patient Stated Goal: go to [place] game in November OT Goal Formulation: With patient Time For Goal Achievement: 03/08/21 Potential to Achieve Goals: Good ADL Goals Pt Will Perform Lower Body Dressing: sit to/from stand;Independently Pt Will Transfer to Toilet: Independently;ambulating Pt/caregiver will Perform Home Exercise Program: Increased strength;Both right and left upper extremity;With theraband;Independently;With written HEP provided Additional ADL Goal #1: Pt to increase activity tolerance > 15 min during ADLs/mobility to improve overall endurance  Plan Discharge plan remains appropriate    Co-evaluation                 AM-PAC OT "6 Clicks" Daily Activity     Outcome Measure   Help from another person eating meals?: None Help from another person taking care of personal grooming?: None Help from another person toileting, which includes using toliet, bedpan, or urinal?: None Help from another person bathing (including washing, rinsing, drying)?: None Help from another person to put on and taking off regular upper body clothing?: None Help from another person to put on and taking off regular lower body clothing?: None 6 Click Score: 24    End of Session    OT Visit Diagnosis: Other abnormalities of gait and mobility (R26.89);Muscle weakness (generalized) (M62.81);Pain   Activity Tolerance Patient tolerated treatment well   Patient Left Other (comment) (walking in room)   Nurse Communication Mobility status        Time: 9935-7017 OT Time Calculation (min): 8 min  Charges: OT General Charges $OT Visit: 1 Visit OT Treatments $Self Care/Home Management : 8-22 mins  Malachy Chamber, OTR/L Acute  Rehab Services Office: [phone]   Layla Maw 02/26/2021, 12:34 PM

## 2021-02-26 NOTE — Progress Notes (Signed)
Patient given discharge instructions by SWAT nurse. PIV removed. Telemetry removed, CCMD notified. Patient taken to vehicle in wheelchair by staff.  Daymon Larsen, RN

## 2021-02-28 ENCOUNTER — Telehealth: Payer: Self-pay

## 2021-02-28 NOTE — Telephone Encounter (Cosign Needed)
Transition Care Management Follow-up Telephone Call  Date of discharge and from where: [facility] 02/26/21 How have you been since you were released from the hospital? Doing ok a little sore Any questions or concerns? No  Items Reviewed: Did the pt receive and understand the discharge instructions provided? Yes  Medications obtained and verified? Yes  Other? No  Any new allergies since your discharge? No  Dietary orders reviewed? Yes Do you have support at home? Yes   Home Care and Equipment/Supplies: Were home health services ordered? not applicable If so, what is the name of the agency?   Has the agency set up a time to come to the patient's home? not applicable Were any new equipment or medical supplies ordered?  No What is the name of the medical supply agency?  Were you able to get the supplies/equipment? not applicable Do you have any questions related to the use of the equipment or supplies? No  Functional Questionnaire: (I = Independent and D = Dependent) ADLs: I  Bathing/Dressing- I  Meal Prep- I  Eating- I  Maintaining continence- I  Transferring/Ambulation- I  Managing Meds- I  Follow up appointments reviewed:  PCP Hospital f/u appt confirmed? No  Pt stated he will call for [facility] f/u appt confirmed? Yes  Scheduled to see Gen Surgery for staple removal 10//11/22 and 03/15/21 with Dr Kieth Brightly   Are transportation arrangements needed? No  If their condition worsens, is the pt aware to call PCP or go to the Emergency Dept.? Yes Was the patient provided with contact information for the PCP's office or ED? Yes Was to pt encouraged to call back with questions or concerns? Yes

## 2021-03-01 NOTE — Telephone Encounter (Signed)
Declines TCM visit with [place] for now

## 2021-03-05 ENCOUNTER — Other Ambulatory Visit: Payer: Self-pay | Admitting: Family Medicine

## 2021-03-22 ENCOUNTER — Other Ambulatory Visit: Payer: Self-pay

## 2021-03-22 ENCOUNTER — Ambulatory Visit: Payer: No Typology Code available for payment source | Admitting: Family Medicine

## 2021-03-22 ENCOUNTER — Encounter: Payer: Self-pay | Admitting: Family Medicine

## 2021-03-22 VITALS — BP 122/78 | HR 88 | Temp 98.5°F | Ht 74.02 in | Wt 317.0 lb

## 2021-03-22 DIAGNOSIS — I2510 Atherosclerotic heart disease of native coronary artery without angina pectoris: Secondary | ICD-10-CM

## 2021-03-22 DIAGNOSIS — Z9889 Other specified postprocedural states: Secondary | ICD-10-CM | POA: Diagnosis not present

## 2021-03-22 DIAGNOSIS — Z23 Encounter for immunization: Secondary | ICD-10-CM

## 2021-03-22 DIAGNOSIS — E1165 Type 2 diabetes mellitus with hyperglycemia: Secondary | ICD-10-CM

## 2021-03-22 DIAGNOSIS — E785 Hyperlipidemia, unspecified: Secondary | ICD-10-CM | POA: Diagnosis not present

## 2021-03-22 DIAGNOSIS — E1169 Type 2 diabetes mellitus with other specified complication: Secondary | ICD-10-CM | POA: Diagnosis not present

## 2021-03-22 DIAGNOSIS — R8290 Unspecified abnormal findings in urine: Secondary | ICD-10-CM

## 2021-03-22 DIAGNOSIS — Z6841 Body Mass Index (BMI) 40.0 and over, adult: Secondary | ICD-10-CM

## 2021-03-22 LAB — COMPREHENSIVE METABOLIC PANEL
ALT: 21 U/L (ref 0–53)
AST: 30 U/L (ref 0–37)
Albumin: 3.9 g/dL (ref 3.5–5.2)
Alkaline Phosphatase: 85 U/L (ref 39–117)
BUN: 15 mg/dL (ref 6–23)
CO2: 28 mEq/L (ref 19–32)
Calcium: 9.9 mg/dL (ref 8.4–10.5)
Chloride: 103 mEq/L (ref 96–112)
Creatinine, Ser: 1 mg/dL (ref 0.40–1.50)
GFR: 81.02 mL/min (ref >60.00)
Glucose, Bld: 100 mg/dL — ABNORMAL HIGH (ref 70–99)
Potassium: 4 mEq/L (ref 3.5–5.1)
Sodium: 142 mEq/L (ref 135–145)
Total Bilirubin: 0.6 mg/dL (ref 0.2–1.2)
Total Protein: 8.4 g/dL — ABNORMAL HIGH (ref 6.0–8.3)

## 2021-03-22 LAB — CBC WITH DIFFERENTIAL/PLATELET
Basophils Absolute: 0.1 10*3/uL (ref 0.0–0.1)
Basophils Relative: 0.7 % (ref 0.0–3.0)
Eosinophils Absolute: 0.5 10*3/uL (ref 0.0–0.7)
Eosinophils Relative: 4.7 % (ref 0.0–5.0)
HCT: 40.3 % (ref 39.0–52.0)
Hemoglobin: 12.9 g/dL — ABNORMAL LOW (ref 13.0–17.0)
Lymphocytes Relative: 23.6 % (ref 12.0–46.0)
Lymphs Abs: 2.5 10*3/uL (ref 0.7–4.0)
MCHC: 32.2 g/dL (ref 30.0–36.0)
MCV: 88.4 fl (ref 78.0–100.0)
Monocytes Absolute: 0.7 10*3/uL (ref 0.1–1.0)
Monocytes Relative: 7.1 % (ref 3.0–12.0)
Neutro Abs: 6.7 10*3/uL (ref 1.4–7.7)
Neutrophils Relative %: 63.9 % (ref 43.0–77.0)
Platelets: 240 10*3/uL (ref 150.0–400.0)
RBC: 4.56 Mil/uL (ref 4.22–5.81)
RDW: 15.6 % — ABNORMAL HIGH (ref 11.5–15.5)
WBC: 10.4 10*3/uL (ref 4.0–10.5)

## 2021-03-22 LAB — POC URINALSYSI DIPSTICK (AUTOMATED)
Bilirubin, UA: NEGATIVE
Blood, UA: POSITIVE
Glucose, UA: NEGATIVE
Ketones, UA: NEGATIVE
Nitrite, UA: POSITIVE
Protein, UA: POSITIVE — AB
Spec Grav, UA: 1.02 (ref 1.010–1.025)
Urobilinogen, UA: 0.2 E.U./dL
pH, UA: 5.5 (ref 5.0–8.0)

## 2021-03-22 LAB — LDL CHOLESTEROL, DIRECT: Direct LDL: 78 mg/dL

## 2021-03-22 NOTE — Addendum Note (Signed)
Addended by: Loura Back on: 03/22/2021 01:55 PM   Modules accepted: Orders

## 2021-03-22 NOTE — Patient Instructions (Addendum)
Health Maintenance Due  Topic Date Due   Zoster Vaccines- Shingrix (1 of 2) - Consider in next visit.  Never done   COLONOSCOPY  -complete cologuard when things settle down  Never done   OPHTHALMOLOGY EXAM  - When you have this completed tomorrow, please let us know.  04/26/2014   Pneumococcal Vaccine 49-62 Years old (3 - PCV) - Prevnar 20 today before you leave.  06/18/2016   COVID-19 Vaccine (2 - Pfizer risk series) - Recommend getting Omicron/Bivalent booster only at your local pharmacy! Please let us know when you have received this vaccination. - you actually need your regular shot first and then wait 2 months 11/14/2019   INFLUENZA VACCINE  - Regular dose flu shot today before your leave.  12/25/2020   Please stop by lab before you go If you have mychart- we will send your results within 3 business days of [place] receiving them.  If you do not have mychart- we will call you about results within 5 business days of [place] receiving them.  *please also note that you will see labs on mychart as soon as they post. I will later go in and write notes on them- will say "notes from Dr. Yong Channel"  I think it reasonable to restart Jardiance again and continue Metformin. Please hold on the Ozempic until your visit in December.  I am glad to see that you are down by 31 lbs in the past 8 months - continue to keep up the good work!  For the area where your prior drain was, you may monitor the area. If you notice any changes or experience any issues, please let me know.  Recommended follow up: Keep December visit.

## 2021-03-22 NOTE — Progress Notes (Signed)
Phone [phone] In person visit   Subjective:   Evan Leonard is a 60 y.o. year old very pleasant male patient who presents for/with See problem oriented charting Chief Complaint  Patient presents with   Follow-up    Pt following up from recent surgery states hes doing okay. Having some fatigue and states his urine has been dark and has a foul odor     This visit occurred during the SARS-CoV-2 public health emergency.  Safety protocols were in place, including screening questions prior to the visit, additional usage of staff PPE, and extensive cleaning of exam room while observing appropriate contact time as indicated for disinfecting solutions.   Past Medical History-  Patient Active Problem List   Diagnosis Date Noted   CAD (coronary artery disease) 01/09/2021    Priority: 1.   Atypical chest pain 02/09/2018    Priority: 1.   History of pulmonary embolism 07/16/2016    Priority: 1.   Diabetes mellitus with hyperglycemia ([place]) 03/31/2013    Priority: 1.   Morbid obesity with body mass index of 40.0-44.9 in adult [facility]) 05/05/2012    Priority: 1.   DVT of left distal popliteal vein 05/05/2012    Priority: 1.   History of diabetic ulcer of foot 03/21/2018    Priority: 2.   S/P repair of recurrent ventral hernia 04/26/2017    Priority: 2.   Chronic venous insufficiency 01/25/2015    Priority: 2.   Obstructive sleep apnea 05/05/2012    Priority: 2.   Eczema 12/27/2019    Priority: 3.   Erectile dysfunction 12/27/2019    Priority: 3.   Renal stone 05/12/2017    Priority: 3.   Hyponatremia 03/23/2013    Priority: 3.   Pressure injury of skin 02/25/2021   Chronic anticoagulation 02/19/2021   History of DVT (deep vein thrombosis) 02/19/2021   SBO (small bowel obstruction) ([place]) 08/20/2018   Incarcerated ventral hernia 12/13/2013   Leukocytosis 03/23/2013    Medications- reviewed and updated Current Outpatient Medications  Medication Sig Dispense Refill    ACCU-CHEK GUIDE test strip USE AS DIRECTED UP TO 4 TIMES DAILY (Patient taking differently: 1 each by Other route 4 (four) times daily.) 100 strip 3   acetaminophen (TYLENOL) 500 MG tablet Take 500-1,000 mg by mouth every 6 (six) hours as needed for mild pain or headache.     atorvastatin (LIPITOR) 80 MG tablet Take 1 tablet (80 mg total) by mouth daily. 30 tablet 5   blood glucose meter kit and supplies KIT Dispense based on patient and insurance preference. Use up to four times daily as directed. E11.65 (Patient taking differently: Inject 1 each into the skin See admin instructions. Dispense based on patient and insurance preference. Use up to four times daily as directed. E11.65) 1 each 0   clopidogrel (PLAVIX) 75 MG tablet Take 1 tablet (75 mg total) by mouth daily with breakfast. 30 tablet 5   ELIQUIS 5 MG TABS tablet TAKE 1 TABLET BY MOUTH TWICE A DAY (Patient taking differently: Take 5 mg by mouth in the morning and at bedtime.) 60 tablet 5   metFORMIN (GLUCOPHAGE XR) 500 MG 24 hr tablet Take 2 tablets (1,000 mg total) by mouth in the morning and at bedtime. Restart on 8/12 (Patient taking differently: Take 1,000 mg by mouth in the morning and at bedtime.) 180 tablet 3   metoprolol succinate (TOPROL-XL) 50 MG 24 hr tablet Take 1 tablet (50 mg total) by mouth daily. 90 tablet  3   Multiple Vitamins-Minerals (CENTRUM SILVER 50+MEN) TABS Take 1 tablet by mouth daily with breakfast.     nitroGLYCERIN (NITROSTAT) 0.4 MG SL tablet Place 1 tablet (0.4 mg total) under the tongue every 5 (five) minutes as needed for chest pain. 25 tablet 1   pantoprazole (PROTONIX) 40 MG tablet Take 1 tablet (40 mg total) by mouth daily. (Patient taking differently: Take 40 mg by mouth daily before breakfast.) 30 tablet 5   PRESCRIPTION MEDICATION Inhale 1 Device into the lungs See admin instructions. CPAP- At bedtime every night     empagliflozin (JARDIANCE) 10 MG TABS tablet Take 1 tablet (10 mg total) by mouth daily.  (Patient not taking: Reported on 03/22/2021) 30 tablet 11   Semaglutide, 1 MG/DOSE, (OZEMPIC, 1 MG/DOSE,) 2 MG/1.5ML SOPN Inject 1 mg into the skin once a week. (Patient not taking: Reported on 03/22/2021) 3 mL 5   No current facility-administered medications for this visit.     Objective:  BP 122/78   Pulse 88   Temp 98.5 F (36.9 C)   Ht 6' 2.02" (1.88 m)   Wt (!) 317 lb (143.8 kg)   SpO2 96%   BMI 40.68 kg/m  Gen: NAD, resting comfortably CV: RRR no murmurs rubs or gallops Lungs: CTAB no crackles, wheeze, rhonchi Abdomen: soft/nontender/nondistended/normal bowel sounds. No rebound or guarding. Midline scar healing well. On left lower abdomen at site of prior drain- mild tenderness- minimal drainage- patient has bandage over area.  Ext: trace edema on compression     Assessment and Plan   # Post-Op bowel obstruction due to hernia s/p repair S:Patient recently underwent exploratory laparotomy and recurrent hernia repair with overlay mesh for recurrent incisional hernia with obstruction on 02/20/2021.  He was hospitalized from 02/19/2021 to 02/26/2021 after presenting with high-grade small bowel obstruction related to large and complex multilocular ventral hernia.  Patient initially required NG tube and later had exploratory laparotomy which led to surgery listed above.  He was able to pass flatus and have bowel movements before discharge  Patient was seen by Dr. Alvino Blood on 03/15/2021- he reported he was doing well. He was able to return back to normal activities and cleared to return back to work on 10/24. Also instructed that patient could follow up as needed.   Today patient reports Occasional mild nausea still but gradually improving some mild fatigue. Patient asks if he would be able to start back work soon. Did have more fatigue after increasing metoprolol to 50 mg last visit (tachycardia)  A/P: He is making good strides postop-has had appropriate follow-up with his surgeon.   Nausea is slowly improving-I am going to hold off on Ozempic restart for now to make sure this continues to improve.  I think it is reasonable at this point for him to return to work next Monday based on his current trajectory of improvement-wrote a work note for him today.  # Diabetes # Morbid obesity S: Medication: Ozempic 0.25 - 1 mg restarted in last visit (he is currently not taking), Metformin 1000 mg XR BID, Jardiance 10 mg -considered increase to 25 mg in last visit (he is currently not taking)6 CBGs-  135 sugar this AM- off jardiance and ozempic since hospital- generally under 140 in AM Exercise and diet- working hard on weight loss Lab Results  Component Value Date   HGBA1C 9.5 (H) 01/01/2021   HGBA1C 8.3 (H) 07/20/2020   HGBA1C 12.8 (A) 12/09/2019  A/P: poor control- suspect that this may  have worsened with being off meds but appropriately held in light of bowel obstruction-he is doing much better and I believe he can restart Jardiance (hold on ozempic for now with ongoing nausea)-he can continue the metformin as he has been taking it-hoping for improvement by next visit-thankfully morning sugars have not become too elevated despite being off of this-some of this could have been related to postop weight loss plus his efforts to lose weight- 31 lbs in 8 months.  -for morbid obesity congratulated on changes- continue efforts  # urine changes since getting out of the hospital urine has been darker and has an odor to it-we will check urinalysis and culture today  #CAD  #hyperlipidemia S: Medication: Atorvastatin 80 mg , plavix 75 mg per cardiology. No nitroglycerin use - last visit considered adding Zetia if LDL did not get uder 70- time for recheck today -also on metoprolol 32m XR Lab Results  Component Value Date   CHOL 151 01/02/2021   HDL 22 (L) 01/02/2021   LDLCALC 90 01/02/2021   LDLDIRECT 128.0 07/20/2020   TRIG 194 (H) 01/02/2021   CHOLHDL 6.9 01/02/2021   A/P: CAD  asymptomatic-continue close cardiology follow-up.  Continue Plavix and atorvastatin 80 mg.  Consideration of adding Zetia if LDL not at goal under 70-update direct LDL with labs today  # Chronic pulmonary embolism/chronic DVT S:Patient remains on Eliquis 5 mg twice daily due to history of recurrent DVT   A/P: stable without obvious worsening- continue current meds   # GERD S:Medication: pantoprazole 40 mg. Only getting mild twinges of chest pain- no longer getting upper abdominal pain. Overall improved  A/P: reasonable control- continue current meds   #colonoscopy on hold as has to be on plavix for a year- planning on cologuard #Flu and prevnar 20 today. Consider shingrix in future.  #he is going to get diabetic eye exam tomorrow  Recommended follow up: No follow-ups on file. Future Appointments  Date Time Provider [place] 03/29/2021  8:15 AM [facility]  04/26/2021  4:00 PM HMarin Olp MD [facility]  07/27/2021  3:00 PM MBurnell Blanks MD [facility]    Lab/Order associations:   ICD-10-CM   1. Post-operative state  Z98.890     2. Type 2 diabetes mellitus with hyperglycemia, without long-term current use of insulin (HCC)  E11.65 CBC with Differential/Platelet    Comprehensive metabolic panel    LDL cholesterol, direct    3. Coronary artery disease involving native coronary artery of native heart without angina pectoris  I25.10     4. Hyperlipidemia associated with type 2 diabetes mellitus (HCC)  E11.69 LDL cholesterol, direct   E78.5     5. Abnormal urine odor  R82.90 POCT Urinalysis Dipstick (Automated)    Urine Culture    6. Morbid obesity with body mass index of 40.0-44.9 in adult ([facility]  E66.01    Z68.41      I,Harris Phan,acting as a scribe for SGarret Reddish MD.,have documented all relevant documentation on the behalf of SGarret Reddish MD,as directed by  SGarret Reddish MD while in the presence of  SGarret Reddish MD.   I, SGarret Reddish MD, have reviewed all documentation for this visit. The documentation on 03/22/21 for the exam, diagnosis, procedures, and orders are all accurate and complete.  Return precautions advised.  SGarret Reddish MD

## 2021-03-22 NOTE — Addendum Note (Signed)
Addended by: Baron Hamper on: 03/22/2021 02:36 PM   Modules accepted: Orders

## 2021-03-23 ENCOUNTER — Telehealth: Payer: Self-pay

## 2021-03-23 NOTE — Telephone Encounter (Signed)
Pt was returning a call. Please advise.

## 2021-03-23 NOTE — Telephone Encounter (Signed)
LMTCB for lab results.  

## 2021-03-24 LAB — URINE CULTURE
MICRO NUMBER:: 12560261
SPECIMEN QUALITY:: ADEQUATE

## 2021-03-26 ENCOUNTER — Other Ambulatory Visit: Payer: Self-pay | Admitting: Family Medicine

## 2021-03-26 MED ORDER — SULFAMETHOXAZOLE-TRIMETHOPRIM 800-160 MG PO TABS: 1.0000 | ORAL_TABLET | Freq: Two times a day (BID) | ORAL | 0 refills | Status: DC

## 2021-03-29 ENCOUNTER — Other Ambulatory Visit: Payer: Self-pay

## 2021-03-29 ENCOUNTER — Other Ambulatory Visit: Payer: No Typology Code available for payment source

## 2021-03-29 DIAGNOSIS — E7800 Pure hypercholesterolemia, unspecified: Secondary | ICD-10-CM

## 2021-03-29 DIAGNOSIS — I2510 Atherosclerotic heart disease of native coronary artery without angina pectoris: Secondary | ICD-10-CM

## 2021-03-29 DIAGNOSIS — I10 Essential (primary) hypertension: Secondary | ICD-10-CM

## 2021-03-29 LAB — HEPATIC FUNCTION PANEL
ALT: 17 IU/L (ref 0–44)
AST: 18 IU/L (ref 0–40)
Albumin: 3.9 g/dL (ref 3.8–4.8)
Alkaline Phosphatase: 106 IU/L (ref 44–121)
Bilirubin Total: 0.9 mg/dL (ref 0.0–1.2)
Bilirubin, Direct: 0.34 mg/dL (ref 0.00–0.40)
Total Protein: 7.3 g/dL (ref 6.0–8.5)

## 2021-03-29 LAB — LIPID PANEL
Chol/HDL Ratio: 3.9 ratio (ref 0.0–5.0)
Cholesterol, Total: 122 mg/dL (ref 100–199)
HDL: 31 mg/dL — ABNORMAL LOW (ref >39)
LDL Chol Calc (NIH): 69 mg/dL (ref 0–99)
Triglycerides: 123 mg/dL (ref 0–149)
VLDL Cholesterol Cal: 22 mg/dL (ref 5–40)

## 2021-04-21 ENCOUNTER — Other Ambulatory Visit: Payer: Self-pay | Admitting: Family Medicine

## 2021-04-26 ENCOUNTER — Ambulatory Visit: Payer: No Typology Code available for payment source | Admitting: Family Medicine

## 2021-05-01 ENCOUNTER — Other Ambulatory Visit: Payer: Self-pay | Admitting: Family Medicine

## 2021-06-05 ENCOUNTER — Encounter: Payer: Self-pay | Admitting: Family Medicine

## 2021-06-05 ENCOUNTER — Other Ambulatory Visit: Payer: Self-pay

## 2021-06-05 ENCOUNTER — Ambulatory Visit: Payer: PRIVATE HEALTH INSURANCE | Admitting: Family Medicine

## 2021-06-05 VITALS — BP 132/80 | HR 86 | Temp 98.1°F | Ht 74.0 in | Wt 329.0 lb

## 2021-06-05 DIAGNOSIS — E1165 Type 2 diabetes mellitus with hyperglycemia: Secondary | ICD-10-CM

## 2021-06-05 DIAGNOSIS — I2510 Atherosclerotic heart disease of native coronary artery without angina pectoris: Secondary | ICD-10-CM

## 2021-06-05 DIAGNOSIS — Z23 Encounter for immunization: Secondary | ICD-10-CM | POA: Diagnosis not present

## 2021-06-05 DIAGNOSIS — I2782 Chronic pulmonary embolism: Secondary | ICD-10-CM

## 2021-06-05 DIAGNOSIS — I82432 Acute embolism and thrombosis of left popliteal vein: Secondary | ICD-10-CM

## 2021-06-05 DIAGNOSIS — E1169 Type 2 diabetes mellitus with other specified complication: Secondary | ICD-10-CM

## 2021-06-05 DIAGNOSIS — E785 Hyperlipidemia, unspecified: Secondary | ICD-10-CM

## 2021-06-05 DIAGNOSIS — Z87440 Personal history of urinary (tract) infections: Secondary | ICD-10-CM

## 2021-06-05 DIAGNOSIS — R8290 Unspecified abnormal findings in urine: Secondary | ICD-10-CM

## 2021-06-05 DIAGNOSIS — Z6841 Body Mass Index (BMI) 40.0 and over, adult: Secondary | ICD-10-CM

## 2021-06-05 NOTE — Patient Instructions (Addendum)
°  Shingrix #1 today. Repeat injection in 4 months at physical .   Please send in cologuard  Get updated eye exam and have them send [place] a copy  Recommend updating covid shot at pharmacy at least 2-3 weeks out  Please stop by lab before you go If you have mychart- we will send your results within 3 business days of [place] receiving them.  If you do not have mychart- we will call you about results within 5 business days of [place] receiving them.  *please also note that you will see labs on mychart as soon as they post. I will later go in and write notes on them- will say "notes from Dr. Yong Channel"   Recommended follow up: Return in about 4 months (around 10/03/2021) for physical or sooner if needed.

## 2021-06-05 NOTE — Addendum Note (Signed)
Addended by: Fulton Mole D on: 06/05/2021 03:55 PM   Modules accepted: Orders

## 2021-06-05 NOTE — Progress Notes (Signed)
Phone [phone] In person visit   Subjective:   Evan Leonard is a 61 y.o. year old very pleasant male patient who presents for/with See problem oriented charting Chief Complaint  Patient presents with   Follow-up    Urine has Odor and is cloudy   This visit occurred during the SARS-CoV-2 public health emergency.  Safety protocols were in place, including screening questions prior to the visit, additional usage of staff PPE, and extensive cleaning of exam room while observing appropriate contact time as indicated for disinfecting solutions.   Past Medical History-  Patient Active Problem List   Diagnosis Date Noted   CAD (coronary artery disease) 01/09/2021    Priority: High   Atypical chest pain 02/09/2018    Priority: High   History of pulmonary embolism 07/16/2016    Priority: High   Diabetes mellitus with hyperglycemia ([place]) 03/31/2013    Priority: High   Morbid obesity with body mass index of 40.0-44.9 in adult [facility]) 05/05/2012    Priority: High   DVT of left distal popliteal vein 05/05/2012    Priority: High   History of diabetic ulcer of foot 03/21/2018    Priority: Medium    S/P repair of recurrent ventral hernia 04/26/2017    Priority: Medium    Chronic venous insufficiency 01/25/2015    Priority: Medium    Obstructive sleep apnea 05/05/2012    Priority: Medium    Eczema 12/27/2019    Priority: Low   Erectile dysfunction 12/27/2019    Priority: Low   Renal stone 05/12/2017    Priority: Low   Hyponatremia 03/23/2013    Priority: Low   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 acute cor pulmonale ([place]) 06/05/2021   Pressure injury of skin 02/25/2021   Chronic anticoagulation 02/19/2021   History of DVT (deep vein thrombosis) 02/19/2021   SBO (small bowel obstruction) ([place]) 08/20/2018   Incarcerated ventral hernia 12/13/2013   Leukocytosis 03/23/2013    Medications- reviewed and updated Current Outpatient Medications  Medication Sig Dispense  Refill   ACCU-CHEK GUIDE test strip USE AS DIRECTED UP TO 4 TIMES DAILY (Patient taking differently: 1 each by Other route 4 (four) times daily.) 100 strip 3   acetaminophen (TYLENOL) 500 MG tablet Take 500-1,000 mg by mouth every 6 (six) hours as needed for mild pain or headache.     atorvastatin (LIPITOR) 80 MG tablet Take 1 tablet (80 mg total) by mouth daily. 30 tablet 5   blood glucose meter kit and supplies KIT Dispense based on patient and insurance preference. Use up to four times daily as directed. E11.65 (Patient taking differently: Inject 1 each into the skin See admin instructions. Dispense based on patient and insurance preference. Use up to four times daily as directed. E11.65) 1 each 0   clopidogrel (PLAVIX) 75 MG tablet Take 1 tablet (75 mg total) by mouth daily with breakfast. 30 tablet 5   ELIQUIS 5 MG TABS tablet TAKE 1 TABLET BY MOUTH TWICE A DAY 60 tablet 5   empagliflozin (JARDIANCE) 10 MG TABS tablet Take 1 tablet (10 mg total) by mouth daily. 30 tablet 11   metFORMIN (GLUCOPHAGE XR) 500 MG 24 hr tablet Take 2 tablets (1,000 mg total) by mouth in the morning and at bedtime. Restart on 8/12 (Patient taking differently: Take 1,000 mg by mouth in the morning and at bedtime.) 180 tablet 3   metoprolol succinate (TOPROL-XL) 50 MG 24 hr tablet Take 1 tablet (50 mg total) by mouth daily.  90 tablet 3   Multiple Vitamins-Minerals (CENTRUM SILVER 50+MEN) TABS Take 1 tablet by mouth daily with breakfast.     nitroGLYCERIN (NITROSTAT) 0.4 MG SL tablet Place 1 tablet (0.4 mg total) under the tongue every 5 (five) minutes as needed for chest pain. 25 tablet 1   pantoprazole (PROTONIX) 40 MG tablet Take 1 tablet (40 mg total) by mouth daily. (Patient taking differently: Take 40 mg by mouth daily before breakfast.) 30 tablet 5   PRESCRIPTION MEDICATION Inhale 1 Device into the lungs See admin instructions. CPAP- At bedtime every night     Semaglutide, 1 MG/DOSE, (OZEMPIC, 1 MG/DOSE,) 2 MG/1.5ML  SOPN Inject 1 mg into the skin once a week. 3 mL 5   sulfamethoxazole-trimethoprim (BACTRIM DS) 800-160 MG tablet Take 1 tablet by mouth 2 (two) times daily. 20 tablet 0   No current facility-administered medications for this visit.     Objective:  BP 132/80 (BP Location: Left Arm, Patient Position: Sitting, Cuff Size: Normal)    Pulse 86    Temp 98.1 F (36.7 C) (Temporal)    Ht 6' 2" (1.88 m)    Wt (!) 329 lb (149.2 kg)    SpO2 99%    BMI 42.24 kg/m  Gen: NAD, resting comfortably CV: RRR no murmurs rubs or gallops Lungs: CTAB no crackles, wheeze, rhonchi Abdomen: soft/nontender/nondistended/normal bowel sounds. Ext: 1+ edema under compression  Skin: warm, dry     Assessment and Plan   #Urine change in patient with history UTI S: Patient has noted urine is cloudy and odorous starting before last visit. Never cleared up completely after prior treatment. No burning pain    Patient reports being abstinent since-offered STD screening but declines. Never ad unprotected sex and then did not get tested for STDs   Treated with bactrim 10 days for serratia marcescens  A/P: will check urine culture again today - plus higher risk for UTI as on jardiance- we may have to reconsider treatment if persistent -thankfully no recurrence of bowel obstruction from hernia which led to repair   # Diabetes #Morbid Obesity S: Medication:Metformin 1000 mg XR BID, Jardiance 10 mg- was considering increasing to 25 mg in a prior visit but hesitant with UTI history - diabetes worsened in last visit due to being off medications - patient was to hold on Ozempic 0.25-1 mg due to concern about nausea and postoperative state after bowel obstruction. Still has not started CBGs- 129 this am higher than usual  Exercise and diet- walking, lost weight with surgery but has regained some weight- similar to September values Lab Results  Component Value Date   HGBA1C 9.5 (H) 01/01/2021   HGBA1C 8.3 (H) 07/20/2020    HGBA1C 12.8 (A) 12/09/2019    A/P: For Diabetes-hopefully stable- update a1c today. Continue current meds for now. If has UTI and a1c controlled may stop jardiance and restart ozempic-   For Morbid Obesity-feels loosened up with holidays and exercise feels can increase- expecting to make progress again by next visit.   #CAD #hyperlipidemia S: Medication:Atorvastatin 80 mg , plavix 75 mg per cardiology. No nitroglycerin use- no chest pain or shortness of breath - also on metoprolol 78m XR Lab Results  Component Value Date   CHOL 122 03/29/2021   HDL 31 (L) 03/29/2021   LDLCALC 69 03/29/2021   LDLDIRECT 78.0 03/22/2021   TRIG 123 03/29/2021   CHOLHDL 3.9 03/29/2021   A/P: cad asymptomatic- continue current meds. Lipids at goal in November- continue current  meds  #Chronic pulmonary embolism/chronic DVT S:medication: Patient remains on Eliquis 5 mg twice daily due to history of recurrent DVT    A/P: with clot history/chronic PE/DVT- continue current meds   # GERD S:Medication: pantoprazole 40 mg. -last visit patient reported only mild twinges of chest pain and was no longer getting upper abdominal pain - takes b12 daily for prevention A/P: reasonable control- continue current meds . Try to reduce in future when other meds stabilized particularly with diabetes  Recommended follow up: Return in about 4 months (around 10/03/2021) for physical or sooner if needed. Future Appointments  Date Time Provider [place]  07/27/2021  3:00 PM Burnell Blanks, MD [facility]   Lab/Order associations:   ICD-10-CM   1. Type 2 diabetes mellitus with hyperglycemia, without long-term current use of insulin (HCC)  E11.65 CBC with Differential/Platelet    Comprehensive metabolic panel    Hemoglobin A1c    2. Morbid obesity with body mass index of 40.0-44.9 in adult ([place])  E66.01    Z68.41     3. Hyperlipidemia associated with type 2 diabetes mellitus ([place])  E11.69     E78.5     4. Deep vein thrombosis (DVT) of popliteal vein of left lower extremity, unspecified chronicity (HCC)  I82.432     5. Abnormal urine odor  R82.90 Urine Culture    6. History of UTI  Z87.440 Urine Culture    7. Coronary artery disease involving native coronary artery of native heart without angina pectoris  I25.10     8. 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 acute cor pulmonale (HCC)  I27.82      No orders of the defined types were placed in this encounter.  I,Harris Phan,acting as a Education administrator for Garret Reddish, MD.,have documented all relevant documentation on the behalf of Garret Reddish, MD,as directed by  Garret Reddish, MD while in the presence of Garret Reddish, MD.  I, Garret Reddish, MD, have reviewed all documentation for this visit. The documentation on 06/05/21 for the exam, diagnosis, procedures, and orders are all accurate and complete.   Return precautions advised.  Garret Reddish, MD

## 2021-06-06 LAB — COMPREHENSIVE METABOLIC PANEL
ALT: 16 U/L (ref 0–53)
AST: 18 U/L (ref 0–37)
Albumin: 4 g/dL (ref 3.5–5.2)
Alkaline Phosphatase: 87 U/L (ref 39–117)
BUN: 17 mg/dL (ref 6–23)
CO2: 29 mEq/L (ref 19–32)
Calcium: 9.7 mg/dL (ref 8.4–10.5)
Chloride: 103 mEq/L (ref 96–112)
Creatinine, Ser: 1.14 mg/dL (ref 0.40–1.50)
GFR: 69.13 mL/min (ref >60.00)
Glucose, Bld: 115 mg/dL — ABNORMAL HIGH (ref 70–99)
Potassium: 4.2 mEq/L (ref 3.5–5.1)
Sodium: 140 mEq/L (ref 135–145)
Total Bilirubin: 0.7 mg/dL (ref 0.2–1.2)
Total Protein: 8.1 g/dL (ref 6.0–8.3)

## 2021-06-06 LAB — CBC WITH DIFFERENTIAL/PLATELET
Basophils Absolute: 0.1 10*3/uL (ref 0.0–0.1)
Basophils Relative: 0.7 % (ref 0.0–3.0)
Eosinophils Absolute: 0.4 10*3/uL (ref 0.0–0.7)
Eosinophils Relative: 2.9 % (ref 0.0–5.0)
HCT: 40.1 % (ref 39.0–52.0)
Hemoglobin: 12.6 g/dL — ABNORMAL LOW (ref 13.0–17.0)
Lymphocytes Relative: 21.4 % (ref 12.0–46.0)
Lymphs Abs: 2.6 10*3/uL (ref 0.7–4.0)
MCHC: 31.4 g/dL (ref 30.0–36.0)
MCV: 86.8 fl (ref 78.0–100.0)
Monocytes Absolute: 0.8 10*3/uL (ref 0.1–1.0)
Monocytes Relative: 6.9 % (ref 3.0–12.0)
Neutro Abs: 8.3 10*3/uL — ABNORMAL HIGH (ref 1.4–7.7)
Neutrophils Relative %: 68.1 % (ref 43.0–77.0)
Platelets: 250 10*3/uL (ref 150.0–400.0)
RBC: 4.62 Mil/uL (ref 4.22–5.81)
RDW: 16.9 % — ABNORMAL HIGH (ref 11.5–15.5)
WBC: 12.2 10*3/uL — ABNORMAL HIGH (ref 4.0–10.5)

## 2021-06-06 LAB — HEMOGLOBIN A1C: Hgb A1c MFr Bld: 7.7 % — ABNORMAL HIGH (ref 4.6–6.5)

## 2021-06-07 ENCOUNTER — Other Ambulatory Visit: Payer: Self-pay | Admitting: Family Medicine

## 2021-06-07 LAB — URINE CULTURE
MICRO NUMBER:: 12851024
SPECIMEN QUALITY:: ADEQUATE

## 2021-06-07 MED ORDER — SULFAMETHOXAZOLE-TRIMETHOPRIM 800-160 MG PO TABS: 1.0000 | ORAL_TABLET | Freq: Two times a day (BID) | ORAL | 0 refills | Status: DC

## 2021-06-13 ENCOUNTER — Other Ambulatory Visit: Payer: Self-pay | Admitting: Family Medicine

## 2021-06-25 LAB — COLOGUARD: Cologuard: POSITIVE — AB

## 2021-07-03 ENCOUNTER — Telehealth: Payer: Self-pay

## 2021-07-03 NOTE — Telephone Encounter (Signed)
Please refer to GI under positive Cologuard-since he has had recurrent blood clots we will have to work around [employer]

## 2021-07-03 NOTE — Telephone Encounter (Signed)
Received a positive cologuard fax from [employer], pt collected on 06/25/21.

## 2021-07-04 ENCOUNTER — Other Ambulatory Visit: Payer: Self-pay

## 2021-07-04 DIAGNOSIS — R195 Other fecal abnormalities: Secondary | ICD-10-CM

## 2021-07-04 NOTE — Telephone Encounter (Signed)
Referral to GI has been placed. 

## 2021-07-12 ENCOUNTER — Other Ambulatory Visit: Payer: Self-pay | Admitting: Family Medicine

## 2021-07-12 NOTE — Telephone Encounter (Signed)
Last refill done by Albertine Patricia, MD Last refill 01/05/2021 Quantity #180 Ref 3 Last OV 06/18/2021

## 2021-07-13 ENCOUNTER — Encounter: Payer: Self-pay | Admitting: Gastroenterology

## 2021-07-14 ENCOUNTER — Other Ambulatory Visit: Payer: Self-pay | Admitting: Family Medicine

## 2021-07-17 ENCOUNTER — Ambulatory Visit: Payer: PRIVATE HEALTH INSURANCE | Admitting: Family Medicine

## 2021-07-17 ENCOUNTER — Other Ambulatory Visit: Payer: Self-pay

## 2021-07-17 ENCOUNTER — Encounter: Payer: Self-pay | Admitting: Family Medicine

## 2021-07-17 VITALS — BP 110/70 | HR 79 | Temp 98.7°F | Ht 74.0 in | Wt 328.4 lb

## 2021-07-17 DIAGNOSIS — E1165 Type 2 diabetes mellitus with hyperglycemia: Secondary | ICD-10-CM

## 2021-07-17 DIAGNOSIS — N390 Urinary tract infection, site not specified: Secondary | ICD-10-CM

## 2021-07-17 DIAGNOSIS — R8290 Unspecified abnormal findings in urine: Secondary | ICD-10-CM | POA: Diagnosis not present

## 2021-07-17 LAB — POC URINALSYSI DIPSTICK (AUTOMATED)
Bilirubin, UA: NEGATIVE
Blood, UA: POSITIVE
Glucose, UA: POSITIVE — AB
Ketones, UA: NEGATIVE
Leukocytes, UA: NEGATIVE
Nitrite, UA: POSITIVE
Protein, UA: NEGATIVE
Spec Grav, UA: 1.02 (ref 1.010–1.025)
Urobilinogen, UA: 0.2 E.U./dL
pH, UA: 6 (ref 5.0–8.0)

## 2021-07-17 MED ORDER — CEFDINIR 300 MG PO CAPS: 300.0000 mg | ORAL_CAPSULE | Freq: Two times a day (BID) | ORAL | 0 refills | Status: DC

## 2021-07-17 NOTE — Patient Instructions (Addendum)
Try omnicef/cefdinir twice daily for 10 days. Should have culture results back in 2-3 days. If you get another episode after this one likely take you back off jardiance.   If you have recurrent blood in ejaculate AFTER you finish antibiotics let me know and I will refer to urology  Recommended follow up: Return for as needed for new, worsening, persistent symptoms. Otherwise keep June visit

## 2021-07-17 NOTE — Progress Notes (Signed)
Phone [phone] In person visit   Subjective:   Evan Leonard is a 61 y.o. year old very pleasant male patient who presents for/with See problem oriented charting Chief Complaint  Patient presents with   cloudy urine/odor   blood in semen    Noticed about a week ago.   This visit occurred during the SARS-CoV-2 public health emergency.  Safety protocols were in place, including screening questions prior to the visit, additional usage of staff PPE, and extensive cleaning of exam room while observing appropriate contact time as indicated for disinfecting solutions.   Past Medical History-  Patient Active Problem List   Diagnosis Date Noted   CAD (coronary artery disease) 01/09/2021    Priority: High   Atypical chest pain 02/09/2018    Priority: High   History of pulmonary embolism 07/16/2016    Priority: High   Diabetes mellitus with hyperglycemia ([place]) 03/31/2013    Priority: High   Morbid obesity with body mass index of 40.0-44.9 in adult [facility]) 05/05/2012    Priority: High   DVT of left distal popliteal vein 05/05/2012    Priority: High   History of diabetic ulcer of foot 03/21/2018    Priority: Medium    S/P repair of recurrent ventral hernia 04/26/2017    Priority: Medium    Chronic venous insufficiency 01/25/2015    Priority: Medium    Obstructive sleep apnea 05/05/2012    Priority: Medium    Eczema 12/27/2019    Priority: Low   Erectile dysfunction 12/27/2019    Priority: Low   Renal stone 05/12/2017    Priority: Low   Hyponatremia 03/23/2013    Priority: Low   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 acute cor pulmonale ([place]) 06/05/2021   Pressure injury of skin 02/25/2021   Chronic anticoagulation 02/19/2021   History of DVT (deep vein thrombosis) 02/19/2021   SBO (small bowel obstruction) ([place]) 08/20/2018   Incarcerated ventral hernia 12/13/2013   Leukocytosis 03/23/2013    Medications- reviewed and updated Current Outpatient Medications   Medication Sig Dispense Refill   ACCU-CHEK GUIDE test strip USE AS DIRECTED UP TO 4 TIMES DAILY (Patient taking differently: 1 each by Other route 4 (four) times daily.) 100 strip 3   acetaminophen (TYLENOL) 500 MG tablet Take 500-1,000 mg by mouth every 6 (six) hours as needed for mild pain or headache.     atorvastatin (LIPITOR) 80 MG tablet TAKE 1 TABLET BY MOUTH EVERY DAY 30 tablet 5   blood glucose meter kit and supplies KIT Dispense based on patient and insurance preference. Use up to four times daily as directed. E11.65 (Patient taking differently: Inject 1 each into the skin See admin instructions. Dispense based on patient and insurance preference. Use up to four times daily as directed. E11.65) 1 each 0   cefdinir (OMNICEF) 300 MG capsule Take 1 capsule (300 mg total) by mouth 2 (two) times daily. 20 capsule 0   clopidogrel (PLAVIX) 75 MG tablet TAKE 1 TABLET BY MOUTH DAILY WITH BREAKFAST. 30 tablet 5   ELIQUIS 5 MG TABS tablet TAKE 1 TABLET BY MOUTH TWICE A DAY 60 tablet 5   empagliflozin (JARDIANCE) 10 MG TABS tablet Take 1 tablet (10 mg total) by mouth daily. 30 tablet 11   metFORMIN (GLUCOPHAGE-XR) 500 MG 24 hr tablet TAKE 2 TABLETS (1,000 MG TOTAL) BY MOUTH IN THE MORNING AND AT BEDTIME. 120 tablet 5   metoprolol succinate (TOPROL-XL) 50 MG 24 hr tablet Take 1 tablet (50 mg total)  by mouth daily. 90 tablet 3   Multiple Vitamins-Minerals (CENTRUM SILVER 50+MEN) TABS Take 1 tablet by mouth daily with breakfast.     nitroGLYCERIN (NITROSTAT) 0.4 MG SL tablet Place 1 tablet (0.4 mg total) under the tongue every 5 (five) minutes as needed for chest pain. 25 tablet 1   pantoprazole (PROTONIX) 40 MG tablet TAKE 1 TABLET BY MOUTH EVERY DAY 30 tablet 5   PRESCRIPTION MEDICATION Inhale 1 Device into the lungs See admin instructions. CPAP- At bedtime every night     Semaglutide, 1 MG/DOSE, (OZEMPIC, 1 MG/DOSE,) 2 MG/1.5ML SOPN Inject 1 mg into the skin once a week. 3 mL 5   No current  facility-administered medications for this visit.     Objective:  BP 110/70    Pulse 79    Temp 98.7 F (37.1 C)    Ht _0  (1.88 m)    Wt (!) 328 lb 6.4 oz (149 kg)    SpO2 95%    BMI 42.16 kg/m  Gen: NAD, resting comfortably CV: RRR no murmurs rubs or gallops Lungs: CTAB no crackles, wheeze, rhonchi Abdomen: Mild suprapubic pain more on the right without obvious hernia.  Obese Ext: no edema Skin: warm, dry  Results for orders placed or performed in visit on 07/17/21 (from the past 24 hour(s))  POCT Urinalysis Dipstick (Automated)     Status: Abnormal   Collection Time: 07/17/21  2:43 PM  Result Value Ref Range   Color, UA yellow    Clarity, UA clear    Glucose, UA Positive (A) Negative   Bilirubin, UA neg    Ketones, UA neg    Spec Grav, UA 1.020 1.010 - 1.025   Blood, UA positive    pH, UA 6.0 5.0 - 8.0   Protein, UA Negative Negative   Urobilinogen, UA 0.2 0.2 or 1.0 E.U./dL   Nitrite, UA positive    Leukocytes, UA Negative Negative       Assessment and Plan   #Cloudy urine/hematospermia S: Patient reports cloudy urine/urinary odor starting about a week or two ago. He took all the antibiotics he was given for 14 day course- finished aroudn 24th or 25 of January- felt better at first and then worsened.   He also noted blood in his semen about a week ago-had 2 episodes- first one was lighter and then was more red second incidence. Has not ejaculated since that time. This was masturbation- has not had recent intercourse unprotected.  -some twinges of suprapubic pain as well- possibly associated with UTI. With minimal pain doubt kidney stones A/P: UA is very concerning for UTI with both leukocytes and nitrites present in the urine as well as potential blood.  He has had hematospermia and this can certainly be caused by UTI.  We opted to treat with cefdinir-has had Serratia marcescens which is susceptible to Bactrim as well as ceftriaxone on last 2 cultures but has been  treated with Bactrim both times-we thought trying an alternate would be reasonable.  I am concerned about the side effects of ciprofloxacin and levofloxacin and other options are IV/IM-I think Omnicef is a reasonable substitute for ceftriaxone. - If he fails to have improvement in symptoms or if culture results show different bacteria or susceptibilities we can certainly change our plan-we discussed should have results back within 2 to 3 days -Discussed if recurrent hematospermia after treatment will refer to urology -Declines being sexually active-doubt UTI  # Diabetes S: Medication:Remains off Ozempic due to concern  for nausea side effects.  On Jardiance 10 mg and metformin extended release 1000 g twice daily Lab Results  Component Value Date   HGBA1C 7.7 (H) 06/05/2021   HGBA1C 9.5 (H) 01/01/2021   HGBA1C 8.3 (H) 07/20/2020    A/P: A1c has been improving and that is great news but I am concerned about recurrent UTIs-we discussed if has another episode after this we may need to stop his Jardiance-for now continue current medications including Jardiance as well as metformin   #other updates- plans to start working out at [employer]. Eye exam soon as well as GI visit thankfully.  -Wonders about going to nasal CPAP option-he will reach out if he wants to see [place] [facility] and I am fine with referral being sent under sleep apnea.  Recommended follow up: Return for as needed for new, worsening, persistent symptoms. Future Appointments  Date Time Provider [place]  07/27/2021  3:00 PM Burnell Blanks, MD [facility]  08/08/2021 11:10 AM Mansouraty, Telford Nab., MD [facility]  11/23/2021  2:40 PM Yong Channel Brayton Mars, MD [facility]   Lab/Order associations:   ICD-10-CM   1. Recurrent UTI  N39.0     2. Type 2 diabetes mellitus with hyperglycemia, without long-term current use of insulin (HCC)  E11.65     3. Cloudy urine  R82.90 Urine Culture     POCT Urinalysis Dipstick (Automated)     Meds ordered this encounter  Medications   cefdinir (OMNICEF) 300 MG capsule    Sig: Take 1 capsule (300 mg total) by mouth 2 (two) times daily.    Dispense:  20 capsule    Refill:  0    Time Spent: 34 minutes of total time (2:40 PM- 3:14 PM) was spent on the date of the encounter performing the following actions: chart review prior to seeing the patient, obtaining history, performing a medically necessary exam, counseling on the treatment plan, placing orders, and documenting in our EHR.    Return precautions advised.  Garret Reddish, MD

## 2021-07-19 LAB — URINE CULTURE
MICRO NUMBER:: 13037108
SPECIMEN QUALITY:: ADEQUATE

## 2021-07-26 NOTE — Progress Notes (Signed)
Chief Complaint  Patient presents with   Follow-up    CAD   History of Present Illness: 61 yo male with history of  CAD, diabetes mellitus, HTN, hyperlipidemia, morbid obesity, history of DVT and PE in 2013, sleep apnea who is here today for cardiac follow up. He was admitted to [facility] 12/31/20 with a NSTEMI. Cardiac cath 01/02/21 with a severe mid RCA stenosis treated with a drug eluting stent. Also with septal branch occlusion. Echo 01/01/21 with LVEF=55-60%. No valve disease.   He is here today for follow up. The patient denies any chest pain, dyspnea, palpitations, lower extremity edema, orthopnea, PND, dizziness, near syncope or syncope.   Primary Care Physician: [name], MD  Past Medical History:  Diagnosis Date   CAD (coronary artery disease)    Diabetes mellitus, type II ([place]) 02/2013   Diabetic ulcer of right foot due to type 2 diabetes mellitus ([place]) 03/21/2018   DVT (deep venous thrombosis) ([place]) 05/04/2012   LLE   Exertional dyspnea 05/04/2012   "isolated episode" (05/05/2012)   Hepatic steatosis    History of chickenpox    Hypertension 03/21/2018   Obesity, Class III, BMI 40-49.9 (morbid obesity) ([place]) 03/21/2018   OSA on CPAP    Peripheral vascular disease (HCC)    varicose veins   Pulmonary embolism ([place]) 05/04/2012   bilaterally   Pyelonephritis 04/2017   Small bowel obstruction ([place]) 09/06/2015   Varicose veins     Past Surgical History:  Procedure Laterality Date   BOWEL RESECTION N/A 02/20/2021   Procedure: SMALL BOWEL RESECTION;  Surgeon: Mickeal Skinner, MD;  Location: [place];  Service: General;  Laterality: N/A;   CORONARY STENT INTERVENTION N/A 01/02/2021   Procedure: CORONARY STENT INTERVENTION;  Surgeon: Jettie Booze, MD;  Location: [place] [facility];  Service: Cardiovascular;  Laterality: N/A;   CYSTOSCOPY W/ URETERAL STENT PLACEMENT Right 05/12/2017   Procedure: CYSTOSCOPY WITH RETROGRADE PYELOGRAM/URETERAL STENT PLACEMENT;   Surgeon: [name], MD;  Location: [place];  Service: Urology;  Laterality: Right;   CYSTOSCOPY/URETEROSCOPY/HOLMIUM LASER/STENT PLACEMENT Right 07/29/2017   Procedure: CYSTOSCOPY RIGHT URETEROSCOPY HOLMIUM LASER STENT EXCHANGE;  Surgeon: [name], MD;  Location: [facility];  Service: Urology;  Laterality: Right;   HERNIA REPAIR     2015   LAPAROTOMY N/A 12/13/2013   Procedure: EXPLORATORY LAPAROTOMY Repair ventral hernia, without mesh, partial omentectomy;  Surgeon: Gwenyth Ober, MD;  Location: [place];  Service: General;  Laterality: N/A;   LEFT HEART CATH AND CORONARY ANGIOGRAPHY N/A 01/02/2021   Procedure: LEFT HEART CATH AND CORONARY ANGIOGRAPHY;  Surgeon: Jettie Booze, MD;  Location: [place] [facility];  Service: Cardiovascular;  Laterality: N/A;   LYSIS OF ADHESION  02/20/2021   Procedure: LYSIS OF ADHESION;  Surgeon: Kieth Brightly, Arta Bruce, MD;  Location: [place];  Service: General;;   VENTRAL HERNIA REPAIR N/A 04/26/2017   Procedure: INCARCERATED HERNIA REPAIR VENTRAL ADULT;  Surgeon: Georganna Skeans, MD;  Location: [place];  Service: General;  Laterality: N/A;   VENTRAL HERNIA REPAIR N/A 02/20/2021   Procedure: EXPLORATORY LAPAROTOMY; RECURRENT HERNIA REPAIR WITH OVERLAY MESH;  Surgeon: Kinsinger, Arta Bruce, MD;  Location: [place];  Service: General;  Laterality: N/A;    Current Outpatient Medications  Medication Sig Dispense Refill   ACCU-CHEK GUIDE test strip USE AS DIRECTED UP TO 4 TIMES DAILY (Patient taking differently: 1 each by Other route 4 (four) times daily.) 100 strip 3   acetaminophen (TYLENOL) 500 MG tablet  Take 500-1,000 mg by mouth every 6 (six) hours as needed for mild pain or headache.     atorvastatin (LIPITOR) 80 MG tablet TAKE 1 TABLET BY MOUTH EVERY DAY 30 tablet 5   blood glucose meter kit and supplies KIT Dispense based on patient and insurance preference. Use up to four times daily as directed. E11.65 (Patient taking differently: Inject 1 each into the skin See admin  instructions. Dispense based on patient and insurance preference. Use up to four times daily as directed. E11.65) 1 each 0   cefdinir (OMNICEF) 300 MG capsule Take 1 capsule (300 mg total) by mouth 2 (two) times daily. 20 capsule 0   clopidogrel (PLAVIX) 75 MG tablet TAKE 1 TABLET BY MOUTH DAILY WITH BREAKFAST. 30 tablet 5   ELIQUIS 5 MG TABS tablet TAKE 1 TABLET BY MOUTH TWICE A DAY 60 tablet 5   empagliflozin (JARDIANCE) 10 MG TABS tablet Take 1 tablet (10 mg total) by mouth daily. 30 tablet 11   metFORMIN (GLUCOPHAGE-XR) 500 MG 24 hr tablet TAKE 2 TABLETS (1,000 MG TOTAL) BY MOUTH IN THE MORNING AND AT BEDTIME. 120 tablet 5   metoprolol succinate (TOPROL-XL) 50 MG 24 hr tablet Take 1 tablet (50 mg total) by mouth daily. 90 tablet 3   Multiple Vitamins-Minerals (CENTRUM SILVER 50+MEN) TABS Take 1 tablet by mouth daily with breakfast.     nitroGLYCERIN (NITROSTAT) 0.4 MG SL tablet Place 1 tablet (0.4 mg total) under the tongue every 5 (five) minutes as needed for chest pain. 25 tablet 1   pantoprazole (PROTONIX) 40 MG tablet TAKE 1 TABLET BY MOUTH EVERY DAY 30 tablet 5   PRESCRIPTION MEDICATION Inhale 1 Device into the lungs See admin instructions. CPAP- At bedtime every night     Semaglutide, 1 MG/DOSE, (OZEMPIC, 1 MG/DOSE,) 2 MG/1.5ML SOPN Inject 1 mg into the skin once a week. 3 mL 5   No current facility-administered medications for this visit.    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 Lives alone. Friends that live close. No pets.       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       Hobbies: golf, basketball, walking   Tired from work right now   Investment banker, operational of Radio broadcast assistant Strain: Not on file  Food Insecurity: Not on file  Transportation Needs: Not on file  Physical Activity: Not on file  Stress: Not on file  Social Connections: Not on file  Intimate Partner Violence: Not on file    Family History  Problem Relation Age of Onset   Diabetes Mother        died age 61- also bleeding ulcer   Hypertension Mother    Heart failure Father  in his 50s (patient was 84). day before surgery planned   Other Maternal Grandmother        states natural causes all grandparents    Review of Systems:  As stated in the HPI and otherwise negative.   BP (!) 142/78    Pulse 81    Ht _0  (1.88 m)    Wt (!) 333 lb 3.2 oz (151.1 kg)    SpO2 98%    BMI 42.78 kg/m   Physical Examination: General: Well developed, well nourished, NAD  HEENT: OP clear, mucus membranes moist  SKIN: warm, dry. No rashes. Neuro: No focal deficits  Musculoskeletal: Muscle strength 5/5 all ext  Psychiatric: Mood and affect normal  Neck: No JVD, no carotid bruits, no thyromegaly, no lymphadenopathy.  Lungs:Clear bilaterally, no wheezes, rhonci, crackles Cardiovascular: Regular rate and rhythm. No murmurs, gallops or rubs. Abdomen:Soft. Bowel sounds present. Non-tender.  Extremities: No lower extremity edema. Pulses are 2 + in the bilateral DP/PT.  EKG:  EKG is not ordered today. The ekg ordered today demonstrates   Echo 01/01/21:  1. Left ventricular ejection fraction, by estimation, is 55 to 60%. The  left ventricle has normal function. The left ventricle has no regional  wall motion abnormalities. Left ventricular diastolic parameters are  consistent  with Grade I diastolic  dysfunction (impaired relaxation).   2. Right ventricular systolic function is normal. The right ventricular  size is normal.   3. The mitral valve is normal in structure. No evidence of mitral valve  regurgitation. No evidence of mitral stenosis.   4. The aortic valve is tricuspid. Aortic valve regurgitation is not  visualized. No aortic stenosis is present.   5. The inferior vena cava is normal in size with greater than 50%  respiratory variability, suggesting right atrial pressure of 3 mmHg.   Cardiac cath 01/02/21:   2nd Sept lesion is 100% stenosed.  Right to left collaterals.   Mid RCA lesion is 95% stenosed.  A drug-eluting stent was successfully placed using a STENT ONYX FRONTIER 4.0X26, postdilated to 4.5 mm.   A drug-eluting stent was successfully placed using a STENT ONYX FRONTIER 4.0X26.   Post intervention, there is a 0% residual stenosis.   The left ventricular systolic function is normal.   LV end diastolic pressure is normal.   The left ventricular ejection fraction is 55-65% by visual estimate.   There is no aortic valve stenosis.  Recent Labs: 01/01/2021: B Natriuretic Peptide 634.1 01/09/2021: TSH 2.71 02/24/2021: Magnesium 1.7 06/05/2021: ALT 16; BUN 17; Creatinine, Ser 1.14; Hemoglobin 12.6; Platelets 250.0; Potassium 4.2; Sodium 140   Lipid Panel    Component Value Date/Time   CHOL 122 03/29/2021 0812   TRIG 123 03/29/2021 0812   HDL 31 (L) 03/29/2021 0812   CHOLHDL 3.9 03/29/2021 0812   CHOLHDL 6.9 01/02/2021 0411   VLDL 39 01/02/2021 0411   LDLCALC 69 03/29/2021 0812   LDLCALC 131 (H) 12/27/2019 1457   LDLDIRECT 78.0 03/22/2021 1342     Wt Readings from Last 3 Encounters:  07/27/21 (!) 333 lb 3.2 oz (151.1 kg)  07/17/21 (!) 328 lb 6.4 oz (149 kg)  06/05/21 (!) 329 lb (149.2 kg)     Assessment and Plan:   1. CAD without angina: NSTEMI in August 2022 secondary to severe mid RCA stenosis. This was treated with a drug eluting  stent. LV function normal by echo. No chest pain. Will continue Plavix until August 2023 at which time we will  stop his Plavix and start ASA. He is also on Eliquis   2. HTN: BP is reasonably well controlled. No changes  3. Hyperlipidemia: LDL at goal in November 2022. Continue statin  4. History of DVT/PE: He is on lifelong Eliquis.   Current medicines are reviewed at length with the patient today.  The patient does not have concerns regarding medicines.  The following changes have been made:  no change  Labs/ tests ordered today include:   No orders of the defined types were placed in this encounter.  Disposition:   F/U with me in 6 months.   Signed, Lauree Chandler, MD 07/27/2021 3:13 PM    [facility] [address], [place], [place]  [address] Phone: [phone]; Fax: [phone]

## 2021-07-27 ENCOUNTER — Encounter: Payer: Self-pay | Admitting: Cardiovascular Disease

## 2021-07-27 ENCOUNTER — Other Ambulatory Visit: Payer: Self-pay

## 2021-07-27 ENCOUNTER — Ambulatory Visit (INDEPENDENT_AMBULATORY_CARE_PROVIDER_SITE_OTHER): Payer: PRIVATE HEALTH INSURANCE | Admitting: Cardiovascular Disease

## 2021-07-27 VITALS — BP 142/78 | HR 81 | Ht 74.0 in | Wt 333.2 lb

## 2021-07-27 DIAGNOSIS — E7800 Pure hypercholesterolemia, unspecified: Secondary | ICD-10-CM | POA: Diagnosis not present

## 2021-07-27 DIAGNOSIS — I10 Essential (primary) hypertension: Secondary | ICD-10-CM | POA: Diagnosis not present

## 2021-07-27 DIAGNOSIS — I2510 Atherosclerotic heart disease of native coronary artery without angina pectoris: Secondary | ICD-10-CM | POA: Diagnosis not present

## 2021-07-27 NOTE — Patient Instructions (Signed)
Medication Instructions:  ?No changes ?*If you need a refill on your cardiac medications before your next appointment, please call your pharmacy* ? ? ?Lab Work: ?none ?If you have labs (blood work) drawn today and your tests are completely normal, you will receive your results only by: ?MyChart Message (if you have MyChart) OR ?A paper copy in the mail ?If you have any lab test that is abnormal or we need to change your treatment, we will call you to review the results. ? ? ?Testing/Procedures: ?none ? ? ?Follow-Up: ?At [facility], you and your health needs are our priority.  As part of our continuing mission to provide you with exceptional heart care, we have created designated Provider Care Teams.  These Care Teams include your primary Cardiologist (physician) and Advanced Practice Providers (APPs -  Physician Assistants and Nurse Practitioners) who all work together to provide you with the care you need, when you need it. ? ? ?Your next appointment:   ?6 month(s) ? ?The format for your next appointment:   ?In Person ? ?Provider:   ?Lauree Chandler, MD   ? ?  ?

## 2021-08-08 ENCOUNTER — Encounter: Payer: Self-pay | Admitting: Gastroenterology

## 2021-08-08 ENCOUNTER — Ambulatory Visit (INDEPENDENT_AMBULATORY_CARE_PROVIDER_SITE_OTHER): Payer: PRIVATE HEALTH INSURANCE | Admitting: Gastroenterology

## 2021-08-08 VITALS — BP 110/70 | HR 72 | Ht 75.0 in | Wt 335.0 lb

## 2021-08-08 DIAGNOSIS — Z7901 Long term (current) use of anticoagulants: Secondary | ICD-10-CM

## 2021-08-08 DIAGNOSIS — R195 Other fecal abnormalities: Secondary | ICD-10-CM

## 2021-08-08 DIAGNOSIS — R140 Abdominal distension (gaseous): Secondary | ICD-10-CM

## 2021-08-08 DIAGNOSIS — K921 Melena: Secondary | ICD-10-CM

## 2021-08-08 DIAGNOSIS — Z7902 Long term (current) use of antithrombotics/antiplatelets: Secondary | ICD-10-CM

## 2021-08-08 DIAGNOSIS — Z1211 Encounter for screening for malignant neoplasm of colon: Secondary | ICD-10-CM | POA: Diagnosis not present

## 2021-08-08 DIAGNOSIS — K30 Functional dyspepsia: Secondary | ICD-10-CM

## 2021-08-08 MED ORDER — NA SULFATE-K SULFATE-MG SULF 17.5-3.13-1.6 GM/177ML PO SOLN: 1.0000 | ORAL | 0 refills | Status: DC

## 2021-08-08 NOTE — Progress Notes (Signed)
? ?GASTROENTEROLOGY OUTPATIENT CLINIC VISIT  ? ?Primary Care Provider ?[name], MD ?[place] [address] ?[phone] ? ?Referring Provider ?[name], MD ?[place],  [place] [address] ?[phone] ? ?Patient Profile: ?Evan Leonard is a 60 y.o. male with a pmh significant for CAD (on Plavix), prior PE (on Eliquis), PVD, diabetes, morbid obesity, hypertension, hyperlipidemia, abdominal/ventral hernias (status post intervention), diverticulosis (with history of prior diverticulitis).  The patient presents to the [facility] for an evaluation and management of problem(s) noted below: ? ?Problem List ?1. Positive colorectal cancer screening using Cologuard test   ?2. Colon cancer screening   ?3. Hematochezia   ?4. Acid indigestion   ?5. Bloating symptom   ?6. Chronic anticoagulation   ?7. Antiplatelet or antithrombotic long-term use   ? ? ?History of Present Illness ?This is the patient's first visit to the outpatient [place] clinic.  He is referred for history of a positive Cologuard.  Due to antiplatelet therapy and anticoagulation therapy and with a recent NSTEMI leading to PCI intervention in 2022 it was felt that the patient needed to be evaluated in clinic prior to next endoscopic evaluation being discussed further.  The patient states that he has a history of prior diverticulosis and diverticulitis which occurred a few years ago.  He did require antibiotic therapy but was able to improved.  He was not recommended a colonoscopy after that bout of diverticulitis.  He had never undergone any previous colon cancer screening.  Patient states that he gets bloating and a daily basis.  He also experiences issues of acid indigestion at times.  He belches at times as well though this has not been overtly concerning for him overall however.  He has noted on very infrequent episodes of blood that is noted on the toilet paper, especially if  he strains.  This occurs a few times per month.  The patient has had abdominal surgeries in the past and has abdominal mesh in place.  He had an NSTEMI in August 2022 and had PCI intervention with Plavix initiation after a drug-eluting stent was placed.  He has a longer standing history of Eliquis use due to a prior PE/DVT.  Patient describes no family history of colon cancer or other GI malignancies.  He has not had any other significant GI issues.  He has never had an upper or lower endoscopy. ? ?GI Review of Systems ?Positive as above ?Negative for regurgitation, odynophagia, dysphagia, alteration of bowel habits, melena ? ?Review of Systems ?General: Denies fevers/chills/weight loss unintentionally ?Cardiovascular: Denies chest pain ?Pulmonary: Denies shortness of breath ?Gastroenterological: See HPI ?Genitourinary: Denies darkened urine or hematuria ?Hematological: Positive for history of easy bruising/bleeding due to his anticoagulation and antiplatelet therapy ?Dermatological: Denies jaundice ?Psychological: Mood is stable ? ? ?Medications ?Current Outpatient Medications  ?Medication Sig Dispense Refill  ? ACCU-CHEK GUIDE test strip USE AS DIRECTED UP TO 4 TIMES DAILY (Patient taking differently: 1 each by Other route 4 (four) times daily.) 100 strip 3  ? acetaminophen (TYLENOL) 500 MG tablet Take 500-1,000 mg by mouth every 6 (six) hours as needed for mild pain or headache.    ? atorvastatin (LIPITOR) 80 MG tablet TAKE 1 TABLET BY MOUTH EVERY DAY 30 tablet 5  ? blood glucose meter kit and supplies KIT Dispense based on patient and insurance preference. Use up to four times daily as directed. E11.65 (Patient taking differently: Inject 1 each into the skin See admin  instructions. Dispense based on patient and insurance preference. Use up to four times daily as directed. E11.65) 1 each 0  ? clopidogrel (PLAVIX) 75 MG tablet TAKE 1 TABLET BY MOUTH DAILY WITH BREAKFAST. 30 tablet 5  ? ELIQUIS 5 MG TABS tablet  TAKE 1 TABLET BY MOUTH TWICE A DAY 60 tablet 5  ? empagliflozin (JARDIANCE) 10 MG TABS tablet Take 1 tablet (10 mg total) by mouth daily. 30 tablet 11  ? metFORMIN (GLUCOPHAGE-XR) 500 MG 24 hr tablet TAKE 2 TABLETS (1,000 MG TOTAL) BY MOUTH IN THE MORNING AND AT BEDTIME. 120 tablet 5  ? metoprolol succinate (TOPROL-XL) 50 MG 24 hr tablet Take 1 tablet (50 mg total) by mouth daily. 90 tablet 3  ? Multiple Vitamins-Minerals (CENTRUM SILVER 50+MEN) TABS Take 1 tablet by mouth daily with breakfast.    ? Na Sulfate-K Sulfate-Mg Sulf (SUPREP BOWEL PREP KIT) 17.5-3.13-1.6 GM/177ML SOLN Take 1 kit by mouth as directed. For colonoscopy prep 354 mL 0  ? nitroGLYCERIN (NITROSTAT) 0.4 MG SL tablet Place 1 tablet (0.4 mg total) under the tongue every 5 (five) minutes as needed for chest pain. 25 tablet 1  ? pantoprazole (PROTONIX) 40 MG tablet TAKE 1 TABLET BY MOUTH EVERY DAY 30 tablet 5  ? PRESCRIPTION MEDICATION Inhale 1 Device into the lungs See admin instructions. CPAP- At bedtime every night    ? ?No current facility-administered medications for this visit.  ? ? ?Allergies ?No Known Allergies ? ?Histories ?Past Medical History:  ?Diagnosis Date  ? CAD (coronary artery disease)   ? Diabetes mellitus, type II ([place]) 02/2013  ? Diabetic ulcer of right foot due to type 2 diabetes mellitus ([place]) 03/21/2018  ? DVT (deep venous thrombosis) ([place]) 05/04/2012  ? LLE  ? Exertional dyspnea 05/04/2012  ? "isolated episode" (05/05/2012)  ? Hepatic steatosis   ? History of chickenpox   ? Hypertension 03/21/2018  ? Obesity, Class III, BMI 40-49.9 (morbid obesity) ([place]) 03/21/2018  ? OSA on CPAP   ? Peripheral vascular disease ([place])   ? varicose veins  ? Pulmonary embolism ([place]) 05/04/2012  ? bilaterally  ? Pyelonephritis 04/2017  ? Small bowel obstruction ([place]) 09/06/2015  ? Varicose veins   ? ?Past Surgical History:  ?Procedure Laterality Date  ? BOWEL RESECTION N/A 02/20/2021  ? Procedure: SMALL BOWEL RESECTION;  Surgeon: Kinsinger, Arta Bruce, MD;  Location: [facility];  Service: General;  Laterality: N/A;  ? CORONARY STENT INTERVENTION N/A 01/02/2021  ? Procedure: CORONARY STENT INTERVENTION;  Surgeon: Jettie Booze, MD;  Location: [facility];  Service: Cardiovascular;  Laterality: N/A;  ? CYSTOSCOPY W/ URETERAL STENT PLACEMENT Right 05/12/2017  ? Procedure: CYSTOSCOPY WITH RETROGRADE PYELOGRAM/URETERAL STENT PLACEMENT;  Surgeon: [name], MD;  Location: [facility];  Service: Urology;  Laterality: Right;  ? CYSTOSCOPY/URETEROSCOPY/HOLMIUM LASER/STENT PLACEMENT Right 07/29/2017  ? Procedure: CYSTOSCOPY RIGHT URETEROSCOPY HOLMIUM LASER STENT EXCHANGE;  Surgeon: [name], MD;  [facility];  Service: Urology;  Laterality: Right;  ? HERNIA REPAIR    ? 2015  ? LAPAROTOMY N/A 12/13/2013  ? Procedure: EXPLORATORY LAPAROTOMY Repair ventral hernia, without mesh, partial omentectomy;  Surgeon: Gwenyth Ober, MD;  Location: [facility];  Service: General;  Laterality: N/A;  ? LEFT HEART CATH AND CORONARY ANGIOGRAPHY N/A 01/02/2021  ? Procedure: LEFT HEART CATH AND CORONARY ANGIOGRAPHY;  Surgeon: Jettie Booze, MD;  Location: [facility];  Service: Cardiovascular;  Laterality: N/A;  ? LYSIS OF ADHESION  02/20/2021  ?  Procedure: LYSIS OF ADHESION;  Surgeon: Kinsinger, Arta Bruce, MD;  Location: [facility];  Service: General;;  ? VENTRAL HERNIA REPAIR N/A 04/26/2017  ? Procedure: INCARCERATED HERNIA REPAIR VENTRAL ADULT;  Surgeon: Georganna Skeans, MD;  Location: [facility];  Service: General;  Laterality: N/A;  ? VENTRAL HERNIA REPAIR N/A 02/20/2021  ? Procedure: EXPLORATORY LAPAROTOMY; RECURRENT HERNIA REPAIR WITH OVERLAY MESH;  Surgeon: Kinsinger, Arta Bruce, MD;  Location: [facility];  Service: General;  Laterality: N/A;  ? ?Social History  ? ?Socioeconomic History  ? Marital status: Single  ?  Spouse name: Not on file  ? Number of children: Not on file  ? Years of education: Not on file  ? Highest education level: Not on file  ?Occupational History  ?  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  Comment: [employer] store  ?Tobacco Use  ? Smoking status: Never  ? Smokeless tobacco: Never  ?Vaping Use  ? Vaping Use: Never used  ?Substance and Sexu

## 2021-08-08 NOTE — Patient Instructions (Signed)
You have been scheduled for an endoscopy and colonoscopy. Please follow the written instructions given to you at your visit today. ?Please pick up your prep supplies at the pharmacy within the next 1-3 days. ?If you use inhalers (even only as needed), please bring them with you on the day of your procedure. ? ?We have sent the following medications to your pharmacy for you to pick up at your convenience: ?[place]  ? ?You will be contaced by our office prior to your procedure for directions on holding your Plavix and Eliquis.  If you do not hear from our office 1 week prior to your scheduled procedure, please call [phone] to discuss. ? ? ?If you are age 29 or younger, your body mass index should be between 19-25. Your Body mass index is 41.87 kg/m?Marland Kitchen If this is out of the aformentioned range listed, please consider follow up with your Primary Care Provider.  ? ?________________________________________________________ ? ?The [facility] providers would like to encourage you to use [facility] to communicate with providers for non-urgent requests or questions.  Due to long hold times on the telephone, sending your provider a message by [facility] may be a faster and more efficient way to get a response.  Please allow 48 business hours for a response.  Please remember that this is for non-urgent requests.  ?_______________________________________________________ ? ?Thank you for choosing me and [facility]. ? ?Dr. Rush Landmark ? ?

## 2021-08-09 ENCOUNTER — Telehealth: Payer: Self-pay

## 2021-08-09 ENCOUNTER — Encounter: Payer: Self-pay | Admitting: Gastroenterology

## 2021-08-09 DIAGNOSIS — K30 Functional dyspepsia: Secondary | ICD-10-CM | POA: Insufficient documentation

## 2021-08-09 DIAGNOSIS — Z7902 Long term (current) use of antithrombotics/antiplatelets: Secondary | ICD-10-CM | POA: Insufficient documentation

## 2021-08-09 DIAGNOSIS — R140 Abdominal distension (gaseous): Secondary | ICD-10-CM | POA: Insufficient documentation

## 2021-08-09 DIAGNOSIS — Z1211 Encounter for screening for malignant neoplasm of colon: Secondary | ICD-10-CM | POA: Insufficient documentation

## 2021-08-09 DIAGNOSIS — R195 Other fecal abnormalities: Secondary | ICD-10-CM | POA: Insufficient documentation

## 2021-08-09 NOTE — Telephone Encounter (Signed)
Dear Dr Yong Channel : ? ?We have scheduled the above named patient for a(n) Endo/Colon procedure. Our records show that (s)he is on anticoagulation therapy. ? ?Please advise as to whether the patient may come off their therapy of Eliquis  1 days prior to their procedure which is scheduled for 09/13/21. ? ?Please route your response to [place] or fax response to [phone] ? ?Sincerely, ? ? ? ?[facility] ? ? ?

## 2021-08-09 NOTE — Telephone Encounter (Signed)
Request for surgical clearance:     Endoscopy Procedure ? ?What type of surgery is being performed?     Colon/Endo ? ?When is this surgery scheduled?     09/13/21 ? ?What type of clearance is required ?   Pharmacy ? ?Are there any medications that need to be held prior to surgery and how long? Plavix x5 days prior to procedure ? ?Practice name and name of physician performing surgery?      [facility] ? ?What is your office phone and fax number?      Phone- [phone]  Fax- [phone] ? ?Anesthesia type (None, local, MAC, general) ?       [facility] ? ?

## 2021-08-10 NOTE — Telephone Encounter (Signed)
? ?  Primary Cardiologist: Lauree Chandler, MD ? ?Chart reviewed as part of pre-operative protocol coverage. Given past medical history and time since last visit, based on ACC/AHA guidelines, Evan Leonard would be at acceptable risk for the planned procedure without further cardiovascular testing.  ? ?Patient had percutaneous coronary intervention with drug-eluting stent placement on 01/02/21.  His Plavix will need to be continued uninterrupted until 8/23.  Please continue Plavix throughout his endoscopy procedures. ? ?I will route this recommendation to the requesting party via Epic fax function and remove from pre-op pool. ? ?Please call with questions. ? ?Jossie Ng. Evan Knaak NP-C ? ?  ?08/10/2021, 9:30 AM ?[place] ?[address] ?Office [phone] Fax [phone] ? ? ? ? ?

## 2021-08-10 NOTE — Telephone Encounter (Signed)
Left message for patient asking for return call. ?

## 2021-08-10 NOTE — Telephone Encounter (Signed)
In the setting of inability to stop Plavix, then we will only be able to do diagnostic evaluations. ?With a positive Cologuard, we cannot wait until August to perform his procedures. ?Patient was aware that this was a possibility if he could not come off Plavix. ?At this point, [place], please let patient know that our procedures will be diagnostic as we discussed.  We can take biopsies.  If we find polyps they will need to remain in place if possible until August or if something high-risk is found we will need to discuss with Cardiology further. ?Thanks. ?GM ? ?

## 2021-08-10 NOTE — Telephone Encounter (Signed)
Please see cardiologist recommendations for Plavix. ?

## 2021-08-13 NOTE — Telephone Encounter (Signed)
He may hold eliquis 1 day prior to procedure. In regards to plavix- I would defer that to cardiology expertise.  ?

## 2021-08-14 NOTE — Telephone Encounter (Signed)
Left message asking for patient to return call.  ?

## 2021-08-15 NOTE — Telephone Encounter (Signed)
Pt has been informed to hold Eliquis x1 day prior to procedure. Pt voiced understanding.  ?

## 2021-08-16 ENCOUNTER — Other Ambulatory Visit: Payer: Self-pay

## 2021-08-16 ENCOUNTER — Telehealth: Payer: Self-pay | Admitting: Family Medicine

## 2021-08-16 DIAGNOSIS — N390 Urinary tract infection, site not specified: Secondary | ICD-10-CM

## 2021-08-16 DIAGNOSIS — R361 Hematospermia: Secondary | ICD-10-CM

## 2021-08-16 NOTE — Telephone Encounter (Signed)
Patient informed that he needs to stay on Plavix for procedure due to stent placement-Per Cardiology ? ?Patient had percutaneous coronary intervention with drug-eluting stent placement on 01/02/21.  His Plavix will need to be continued uninterrupted until 8/23.  Please continue Plavix throughout his endoscopy procedures. ? ?Pt understanding that he may hold his Eliquis x1 day prior to procedure. Per Dr Yong Channel  ? ?Patient voiced understanding for all above.  ? ?

## 2021-08-16 NOTE — Telephone Encounter (Signed)
Referral has been placed. 

## 2021-08-16 NOTE — Telephone Encounter (Signed)
Patient is having another UTI- patient stated Dr Yong Channel might ref him out to specialist. Does he need to be seen or can office place referral?  ?

## 2021-08-21 ENCOUNTER — Ambulatory Visit (HOSPITAL_COMMUNITY)
Admission: EM | Admit: 2021-08-21 | Discharge: 2021-08-21 | Disposition: A | Discharge status: Home / Self Care | Payer: PRIVATE HEALTH INSURANCE | Attending: Family Medicine | Admitting: Family Medicine

## 2021-08-21 ENCOUNTER — Encounter (HOSPITAL_COMMUNITY): Payer: Self-pay | Admitting: Emergency Medicine

## 2021-08-21 DIAGNOSIS — N390 Urinary tract infection, site not specified: Secondary | ICD-10-CM | POA: Diagnosis present

## 2021-08-21 LAB — POCT URINALYSIS DIPSTICK, ED / UC
Bilirubin Urine: NEGATIVE
Glucose, UA: >=1000 mg/dL — AB
Ketones, ur: NEGATIVE mg/dL
Nitrite: POSITIVE — AB
Protein, ur: NEGATIVE mg/dL
Specific Gravity, Urine: 1.015 (ref 1.005–1.030)
Urobilinogen, UA: 0.2 mg/dL (ref 0.0–1.0)
pH: 5.5 (ref 5.0–8.0)

## 2021-08-21 MED ORDER — LEVOFLOXACIN 500 MG PO TABS: 500.0000 mg | ORAL_TABLET | Freq: Every day | ORAL | 0 refills | Status: AC

## 2021-08-21 NOTE — Discharge Instructions (Addendum)
Your urinalysis showed some white blood cells and nitrites and sugar.  Culture of the urine is sent, and staff will call you if it looks like you need a different antibiotic ? ?Take Levaquin 500 mg--1 daily for 10 days. ?

## 2021-08-21 NOTE — ED Provider Notes (Signed)
?[facility] ? ? ? ?CSN: [id number] ?Arrival date & time: 08/21/21  1537 ? ? ?  ? ?History   ?Chief Complaint ?Chief Complaint  ?Patient presents with  ? Dysuria  ? Groin Pain  ? Back Pain  ? ? ?HPI ?Evan Leonard is a 61 y.o. male.  ? ? ?Dysuria ?Presenting symptoms: dysuria   ?Associated symptoms: groin pain   ?Groin Pain ? ?Back Pain ?Associated symptoms: dysuria   ?Here for a 1 to 2-week history of mild dysuria, urinary frequency, and now some lower back pain.  He also has had occasional right groin pain.  No fever or chills or nausea or vomiting ? ?He has had recurrent UTIs of late- he was last treated with Omnicef for 10 days -he finished that approximately March 10.  A few days after he finished the antibiotics, he began having the above-noted symptoms.  The back pain just started in the last day or so. ? ? ? ?Past Medical History:  ?Diagnosis Date  ? CAD (coronary artery disease)   ? Diabetes mellitus, type II ([place]) 02/2013  ? Diabetic ulcer of right foot due to type 2 diabetes mellitus ([place]) 03/21/2018  ? DVT (deep venous thrombosis) ([place]) 05/04/2012  ? LLE  ? Exertional dyspnea 05/04/2012  ? "isolated episode" (05/05/2012)  ? Hepatic steatosis   ? History of chickenpox   ? Hypertension 03/21/2018  ? Obesity, Class III, BMI 40-49.9 (morbid obesity) ([place]) 03/21/2018  ? OSA on CPAP   ? Peripheral vascular disease ([place])   ? varicose veins  ? Pulmonary embolism ([place]) 05/04/2012  ? bilaterally  ? Pyelonephritis 04/2017  ? Small bowel obstruction ([place]) 09/06/2015  ? Varicose veins   ? ? ?Patient Active Problem List  ? Diagnosis Date Noted  ? Positive colorectal cancer screening using Cologuard test 08/09/2021  ? Colon cancer screening 08/09/2021  ? Acid indigestion 08/09/2021  ? Antiplatelet or antithrombotic long-term use 08/09/2021  ? Bloating symptom 08/09/2021  ? Hematochezia 08/09/2021  ? 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 acute cor pulmonale ([place]) 06/05/2021  ? Pressure injury of skin  02/25/2021  ? Chronic anticoagulation 02/19/2021  ? History of DVT (deep vein thrombosis) 02/19/2021  ? CAD (coronary artery disease) 01/09/2021  ? Eczema 12/27/2019  ? Erectile dysfunction 12/27/2019  ? SBO (small bowel obstruction) ([place]) 08/20/2018  ? History of diabetic ulcer of foot 03/21/2018  ? Atypical chest pain 02/09/2018  ? Renal stone 05/12/2017  ? S/P repair of recurrent ventral hernia 04/26/2017  ? History of pulmonary embolism 07/16/2016  ? Chronic venous insufficiency 01/25/2015  ? Incarcerated ventral hernia 12/13/2013  ? Diabetes mellitus with hyperglycemia ([place]) 03/31/2013  ? Leukocytosis 03/23/2013  ? Hyponatremia 03/23/2013  ? Morbid obesity with body mass index of 40.0-44.9 in adult [facility]) 05/05/2012  ? Obstructive sleep apnea 05/05/2012  ? DVT of left distal popliteal vein 05/05/2012  ? ? ?Past Surgical History:  ?Procedure Laterality Date  ? BOWEL RESECTION N/A 02/20/2021  ? Procedure: SMALL BOWEL RESECTION;  Surgeon: Kinsinger, Arta Bruce, MD;  Location: [facility];  Service: General;  Laterality: N/A;  ? CORONARY STENT INTERVENTION N/A 01/02/2021  ? Procedure: CORONARY STENT INTERVENTION;  Surgeon: Jettie Booze, MD;  Location: [facility];  Service: Cardiovascular;  Laterality: N/A;  ? CYSTOSCOPY W/ URETERAL STENT PLACEMENT Right 05/12/2017  ? Procedure: CYSTOSCOPY WITH RETROGRADE PYELOGRAM/URETERAL STENT PLACEMENT;  Surgeon: [name], MD;  Location: [facility];  Service: Urology;  Laterality: Right;  ? CYSTOSCOPY/URETEROSCOPY/HOLMIUM LASER/STENT  PLACEMENT Right 07/29/2017  ? Procedure: CYSTOSCOPY RIGHT URETEROSCOPY HOLMIUM LASER STENT EXCHANGE;  Surgeon: [name], MD;  [facility];  Service: Urology;  Laterality: Right;  ? HERNIA REPAIR    ? 2015  ? LAPAROTOMY N/A 12/13/2013  ? Procedure: EXPLORATORY LAPAROTOMY Repair ventral hernia, without mesh, partial omentectomy;  Surgeon: Gwenyth Ober, MD;  Location: [facility];  Service: General;  Laterality: N/A;  ? LEFT HEART CATH AND CORONARY  ANGIOGRAPHY N/A 01/02/2021  ? Procedure: LEFT HEART CATH AND CORONARY ANGIOGRAPHY;  Surgeon: Jettie Booze, MD;  Location: [facility];  Service: Cardiovascular;  Laterality: N/A;  ? LYSIS OF ADHESION  02/20/2021  ? Procedure: LYSIS OF ADHESION;  Surgeon: Kinsinger, Arta Bruce, MD;  Location: [facility];  Service: General;;  ? VENTRAL HERNIA REPAIR N/A 04/26/2017  ? Procedure: INCARCERATED HERNIA REPAIR VENTRAL ADULT;  Surgeon: Georganna Skeans, MD;  Location: [facility];  Service: General;  Laterality: N/A;  ? VENTRAL HERNIA REPAIR N/A 02/20/2021  ? Procedure: EXPLORATORY LAPAROTOMY; RECURRENT HERNIA REPAIR WITH OVERLAY MESH;  Surgeon: Kinsinger, Arta Bruce, MD;  Location: [facility];  Service: General;  Laterality: N/A;  ? ? ? ? ? ?Home Medications   ? ?Prior to Admission medications   ?Medication Sig Start Date End Date Taking? Authorizing Provider  ?levofloxacin (LEVAQUIN) 500 MG tablet Take 1 tablet (500 mg total) by mouth daily for 10 days. 08/21/21 08/31/21 Yes Mareli Antunes, Gwenlyn Perking, MD  ?ACCU-CHEK GUIDE test strip USE AS DIRECTED UP TO 4 TIMES DAILY ?Patient taking differently: 1 each by Other route 4 (four) times daily. 12/30/19   [name], MD  ?acetaminophen (TYLENOL) 500 MG tablet Take 500-1,000 mg by mouth every 6 (six) hours as needed for mild pain or headache.    [provider]  ?atorvastatin (LIPITOR) 80 MG tablet TAKE 1 TABLET BY MOUTH EVERY DAY 07/16/21   [name], MD  ?blood glucose meter kit and supplies KIT Dispense based on patient and insurance preference. Use up to four times daily as directed. E11.65 ?Patient taking differently: Inject 1 each into the skin See admin instructions. Dispense based on patient and insurance preference. Use up to four times daily as directed. E11.65 12/09/19   [name], MD  ?clopidogrel (PLAVIX) 75 MG tablet TAKE 1 TABLET BY MOUTH DAILY WITH BREAKFAST. 06/13/21   [name], MD  ?Arne [place] 5 MG TABS tablet TAKE 1 TABLET BY MOUTH TWICE A DAY  05/01/21   [name], MD  ?empagliflozin (JARDIANCE) 10 MG TABS tablet Take 1 tablet (10 mg total) by mouth daily. 01/19/21   [name], MD  ?metFORMIN (GLUCOPHAGE-XR) 500 MG 24 hr tablet TAKE 2 TABLETS (1,000 MG TOTAL) BY MOUTH IN THE MORNING AND AT BEDTIME. 07/12/21   [name], MD  ?metoprolol succinate (TOPROL-XL) 50 MG 24 hr tablet Take 1 tablet (50 mg total) by mouth daily. 01/09/21   [name], MD  ?Multiple Vitamins-Minerals (CENTRUM SILVER 50+MEN) TABS Take 1 tablet by mouth daily with breakfast.    [provider]  ?Na Sulfate-K Sulfate-Mg Sulf (SUPREP BOWEL PREP KIT) 17.5-3.13-1.6 GM/177ML SOLN Take 1 kit by mouth as directed. For colonoscopy prep 08/08/21   Mansouraty, Telford Nab., MD  ?nitroGLYCERIN (NITROSTAT) 0.4 MG SL tablet Place 1 tablet (0.4 mg total) under the tongue every 5 (five) minutes as needed for chest pain. 01/03/21   Elgergawy, Silver Huguenin, MD  ?pantoprazole (PROTONIX) 40 MG tablet TAKE 1 TABLET BY  MOUTH EVERY DAY 07/16/21   [name], MD  ?PRESCRIPTION MEDICATION Inhale 1 Device into the lungs See admin instructions. CPAP- At bedtime every night    [provider]  ? ? ?Family History ?Family History  ?Problem Relation Age of Onset  ? Diabetes Mother   ?     died age 44- also bleeding ulcer  ? Hypertension Mother   ? Heart failure Father   ?     in his 49s (patient was 23). day before surgery planned  ? Other Maternal Grandmother   ?     states natural causes all grandparents  ? Colon cancer Neg Hx   ? Esophageal cancer Neg Hx   ? Inflammatory bowel disease Neg Hx   ? Liver disease Neg Hx   ? Pancreatic cancer Neg Hx   ? Rectal cancer Neg Hx   ? Stomach cancer Neg Hx   ? ? ?Social History ?Social History  ? ?Tobacco Use  ? Smoking status: Never  ? Smokeless tobacco: Never  ?Vaping Use  ? Vaping Use: Never used  ?Substance Use Topics  ? Alcohol use: No  ?  Alcohol/week: 0.0 standard drinks  ? Drug use: No  ? ? ? ?Allergies   ?Patient has no  known allergies. ? ? ?Review of Systems ?Review of Systems  ?Genitourinary:  Positive for dysuria.  ?Musculoskeletal:  Positive for back pain.  ? ? ?Physical Exam ?Triage Vital Signs ?ED Triage Vitals

## 2021-08-21 NOTE — ED Triage Notes (Signed)
Pt is present today with urine frequency, groin pain, lower back pain, and dysuria. Pt sx started last Thursday  ?

## 2021-08-24 LAB — URINE CULTURE: Culture: >=100000 — AB

## 2021-09-04 ENCOUNTER — Encounter: Payer: Self-pay | Admitting: Gastroenterology

## 2021-09-13 ENCOUNTER — Encounter: Payer: Self-pay | Admitting: Gastroenterology

## 2021-09-13 ENCOUNTER — Ambulatory Visit (AMBULATORY_SURGERY_CENTER): Payer: PRIVATE HEALTH INSURANCE | Admitting: Gastroenterology

## 2021-09-13 VITALS — BP 143/88 | HR 75 | Temp 96.2°F | Resp 16 | Ht 75.0 in | Wt 335.0 lb

## 2021-09-13 DIAGNOSIS — R195 Other fecal abnormalities: Secondary | ICD-10-CM

## 2021-09-13 DIAGNOSIS — R140 Abdominal distension (gaseous): Secondary | ICD-10-CM

## 2021-09-13 DIAGNOSIS — Z7901 Long term (current) use of anticoagulants: Secondary | ICD-10-CM

## 2021-09-13 DIAGNOSIS — D124 Benign neoplasm of descending colon: Secondary | ICD-10-CM

## 2021-09-13 DIAGNOSIS — K30 Functional dyspepsia: Secondary | ICD-10-CM

## 2021-09-13 DIAGNOSIS — Z7902 Long term (current) use of antithrombotics/antiplatelets: Secondary | ICD-10-CM

## 2021-09-13 DIAGNOSIS — K449 Diaphragmatic hernia without obstruction or gangrene: Secondary | ICD-10-CM | POA: Diagnosis not present

## 2021-09-13 DIAGNOSIS — D123 Benign neoplasm of transverse colon: Secondary | ICD-10-CM | POA: Diagnosis not present

## 2021-09-13 DIAGNOSIS — R1013 Epigastric pain: Secondary | ICD-10-CM | POA: Diagnosis not present

## 2021-09-13 DIAGNOSIS — K229 Disease of esophagus, unspecified: Secondary | ICD-10-CM

## 2021-09-13 DIAGNOSIS — K921 Melena: Secondary | ICD-10-CM

## 2021-09-13 MED ORDER — SODIUM CHLORIDE 0.9 % IV SOLN: 500.0000 mL | Freq: Once | INTRAVENOUS | Status: DC

## 2021-09-13 NOTE — Op Note (Addendum)
[place] ?Patient Name: Evan Leonard ?Procedure Date: 09/13/2021 3:20 PM ?MRN: [id number] ?Endoscopist: Justice Britain , MD ?Age: 61 ?Referring MD:  ?Date of Birth: Jan 07, 1961 ?Gender: Male ?Account #: [id number] ?Procedure:                Colonoscopy ?Indications:              Positive Cologuard test ?Medicines:                Monitored Anesthesia Care ?Procedure:                Pre-Anesthesia Assessment: ?                          - Prior to the procedure, a History and Physical  ?                          was performed, and patient medications and  ?                          allergies were reviewed. The patient's tolerance of  ?                          previous anesthesia was also reviewed. The risks  ?                          and benefits of the procedure and the sedation  ?                          options and risks were discussed with the patient.  ?                          All questions were answered, and informed consent  ?                          was obtained. Prior Anticoagulants: The patient  ?                          last took Eliquis (apixaban) 2 days and Plavix  ?                          (clopidogrel) 5 days prior to the procedure. ASA  ?                          Grade Assessment: III - A patient with severe  ?                          systemic disease. After reviewing the risks and  ?                          benefits, the patient was deemed in satisfactory  ?                          condition to undergo the procedure. ?  After obtaining informed consent, the colonoscope  ?                          was passed under direct vision. Throughout the  ?                          procedure, the patient's blood pressure, pulse, and  ?                          oxygen saturations were monitored continuously. The  ?                          Olympus [id number] (#[id number]) Colonoscope was  ?                          introduced through the anus and advanced to the 5  ?                           cm into the ileum. The colonoscopy was unusually  ?                          difficult due to a redundant colon and significant  ?                          looping. Successful completion of the procedure was  ?                          aided by changing the patient's position, using  ?                          manual pressure, straightening and shortening the  ?                          scope to obtain bowel loop reduction and using  ?                          scope torsion. The patient tolerated the procedure.  ?                          The quality of the bowel preparation was adequate.  ?                          The terminal ileum, ileocecal valve, appendiceal  ?                          orifice, and rectum were photographed. ?Scope In: 3:39:35 PM ?Scope Out: 4:09:10 PM ?Scope Withdrawal Time: 0 hours 21 minutes 14 seconds  ?Total Procedure Duration: 0 hours 29 minutes 35 seconds  ?Findings:                 The digital rectal exam findings include  ?                          hemorrhoids. Pertinent negatives include no  ?  palpable rectal lesions. ?                          The colon (entire examined portion) was grossly  ?                          redundant. ?                          The terminal ileum and ileocecal valve appeared  ?                          normal. ?                          Two sessile polyps were found in the descending  ?                          colon and transverse colon. The polyps were 3 to 4  ?                          mm in size. These polyps were removed with a cold  ?                          snare. Resection and retrieval were complete. ?                          Multiple small-mouthed diverticula were found in  ?                          the recto-sigmoid colon and sigmoid colon. ?                          Normal mucosa was found in the entire colon  ?                          otherwise. ?                          Non-bleeding  non-thrombosed external and internal  ?                          hemorrhoids were found during retroflexion, during  ?                          perianal exam and during digital exam. The  ?                          hemorrhoids were Grade II (internal hemorrhoids  ?                          that prolapse but reduce spontaneously). ?Complications:            No immediate complications. ?Estimated Blood Loss:     Estimated blood loss was minimal. ?Impression:               -  Hemorrhoids found on digital rectal exam. ?                          - Redundant and loopy colon. ?                          - The examined portion of the ileum was normal. ?                          - Two 3 to 4 mm polyps in the descending colon and  ?                          in the transverse colon, removed with a cold snare.  ?                          Resected and retrieved. ?                          - Diverticulosis in the recto-sigmoid colon and in  ?                          the sigmoid colon. ?                          - Normal mucosa in the entire examined colon  ?                          otherwise. ?                          - Non-bleeding non-thrombosed external and internal  ?                          hemorrhoids. ?Recommendation:           - The patient will be observed post-procedure,  ?                          until all discharge criteria are met. ?                          - Discharge patient to home. ?                          - Patient has a contact number available for  ?                          emergencies. The signs and symptoms of potential  ?                          delayed complications were discussed with the  ?                          patient. Return to normal activities tomorrow.  ?  Written discharge instructions were provided to the  ?                          patient. ?                          - High fiber diet. ?                          - Use FiberCon 1-2 tablets PO daily. ?                           - Eliquis restart on 4/22. ?                          - Plavix restart on 4/23. ?                          - Continue present medications. ?                          - Await pathology results. ?                          - Repeat colonoscopy in 5-10 years for surveillance  ?                          based on pathology results and findings of  ?                          adenomatous tissue. ?                          - The findings and recommendations were discussed  ?                          with the patient. ?Justice Britain, MD ?09/13/2021 4:22:11 PM ?

## 2021-09-13 NOTE — Progress Notes (Signed)
Called to room to assist during endoscopic procedure.  Patient ID and intended procedure confirmed with present staff. Received instructions for my participation in the procedure from the performing physician.  

## 2021-09-13 NOTE — Patient Instructions (Addendum)
HANDOUTS PROVIDED ON: HIATAL HERNIA, POLYPS, DIVERTICULOSIS, & HEMORRHOIDS ? ?The polyps removed/biopsies taken today have been sent for pathology.  The results can take 1-3 weeks to receive.  When your next colonoscopy should occur will be based on the pathology results.   ? ?You may resume your previous diet and medication schedule.  Restart Eliquis on 4/22 and Plavix on 4/23. ? ?Thank you for allowing [place] to care for you today!!! ? ? ?YOU HAD AN ENDOSCOPIC PROCEDURE TODAY AT [facility]:   Refer to the procedure report that was given to you for any specific questions about what was found during the examination.  If the procedure report does not answer your questions, please call your gastroenterologist to clarify.  If you requested that your care partner not be given the details of your procedure findings, then the procedure report has been included in a sealed envelope for you to review at your convenience later. ? ?YOU SHOULD EXPECT: Some feelings of bloating in the abdomen. Passage of more gas than usual.  Walking can help get rid of the air that was put into your GI tract during the procedure and reduce the bloating. If you had a lower endoscopy (such as a colonoscopy or flexible sigmoidoscopy) you may notice spotting of blood in your stool or on the toilet paper. If you underwent a bowel prep for your procedure, you may not have a normal bowel movement for a few days. ? ?Please Note:  You might notice some irritation and congestion in your nose or some drainage.  This is from the oxygen used during your procedure.  There is no need for concern and it should clear up in a day or so. ? ?SYMPTOMS TO REPORT IMMEDIATELY: ? ?Following lower endoscopy (colonoscopy or flexible sigmoidoscopy): ? Excessive amounts of blood in the stool ? Significant tenderness or worsening of abdominal pains ? Swelling of the abdomen that is new, acute ? Fever of 100?F or higher ? ?Following upper endoscopy  (EGD) ? Vomiting of blood or coffee ground material ? New chest pain or pain under the shoulder blades ? Painful or persistently difficult swallowing ? New shortness of breath ? Fever of 100?F or higher ? Black, tarry-looking stools ? ?For urgent or emergent issues, a gastroenterologist can be reached at any hour by calling [phone]. ?Do not use MyChart messaging for urgent concerns.  ? ? ?DIET:  We do recommend a small meal at first, but then you may proceed to your regular diet.  Drink plenty of fluids but you should avoid alcoholic beverages for 24 hours. ? ?ACTIVITY:  You should plan to take it easy for the rest of today and you should NOT DRIVE or use heavy machinery until tomorrow (because of the sedation medicines used during the test).   ? ?FOLLOW UP: ?Our staff will call the number listed on your records Monday morning between 7:15 am and 8:15 am following your procedure to check on you and address any questions or concerns that you may have regarding the information given to you following your procedure. If we do not reach you, we will leave a message.  We will attempt to reach you two times.  During this call, we will ask if you have developed any symptoms of COVID 19. If you develop any symptoms (ie: fever, flu-like symptoms, shortness of breath, cough etc.) before then, please call [phone].  If you test positive for Covid 19 in the 2 weeks post procedure, please  call and report this information to [place].   ? ?If any biopsies were taken you will be contacted by phone or by letter within the next 1-3 weeks.  Please call us at [phone] if you have not heard about the biopsies in 3 weeks.  ? ? ?SIGNATURES/CONFIDENTIALITY: ?You and/or your care partner have signed paperwork which will be entered into your electronic medical record.  These signatures attest to the fact that that the information above on your After Visit Summary has been reviewed and is understood.  Full responsibility of the  confidentiality of this discharge information lies with you and/or your care-partner. ? ?

## 2021-09-13 NOTE — Progress Notes (Signed)
Sedate, gd SR, tolerated procedure well, VSS, report to RN 

## 2021-09-13 NOTE — Progress Notes (Signed)
? ?GASTROENTEROLOGY PROCEDURE H&P NOTE  ? ?Primary Care Physician: ?[name], MD ? ?HPI: ?Evan Leonard is a 61 y.o. male who presents for Colonoscopy for Positive Cologuard, EGD for pyrosis, bloating. ? ?Past Medical History:  ?Diagnosis Date  ? CAD (coronary artery disease)   ? Diabetes mellitus, type II ([place]) 02/2013  ? Diabetic ulcer of right foot due to type 2 diabetes mellitus ([place]) 03/21/2018  ? DVT (deep venous thrombosis) ([place]) 05/04/2012  ? LLE  ? Exertional dyspnea 05/04/2012  ? "isolated episode" (05/05/2012)  ? Hepatic steatosis   ? History of chickenpox   ? Hypertension 03/21/2018  ? Obesity, Class III, BMI 40-49.9 (morbid obesity) ([place]) 03/21/2018  ? OSA on CPAP   ? Peripheral vascular disease ([place])   ? varicose veins  ? Pulmonary embolism ([place]) 05/04/2012  ? bilaterally  ? Pyelonephritis 04/2017  ? Small bowel obstruction ([place]) 09/06/2015  ? Varicose veins   ? ?Past Surgical History:  ?Procedure Laterality Date  ? BOWEL RESECTION N/A 02/20/2021  ? Procedure: SMALL BOWEL RESECTION;  Surgeon: Kinsinger, Arta Bruce, MD;  Location: [facility];  Service: General;  Laterality: N/A;  ? CORONARY STENT INTERVENTION N/A 01/02/2021  ? Procedure: CORONARY STENT INTERVENTION;  Surgeon: Jettie Booze, MD;  Location: [facility];  Service: Cardiovascular;  Laterality: N/A;  ? CYSTOSCOPY W/ URETERAL STENT PLACEMENT Right 05/12/2017  ? Procedure: CYSTOSCOPY WITH RETROGRADE PYELOGRAM/URETERAL STENT PLACEMENT;  Surgeon: [name], MD;  Location: [facility];  Service: Urology;  Laterality: Right;  ? CYSTOSCOPY/URETEROSCOPY/HOLMIUM LASER/STENT PLACEMENT Right 07/29/2017  ? Procedure: CYSTOSCOPY RIGHT URETEROSCOPY HOLMIUM LASER STENT EXCHANGE;  Surgeon: [name], MD;  Location: [facility];  Service: Urology;  Laterality: Right;  ? HERNIA REPAIR    ? 2015  ? LAPAROTOMY N/A 12/13/2013  ? Procedure: EXPLORATORY LAPAROTOMY Repair ventral hernia, without mesh, partial omentectomy;  Surgeon: Gwenyth Ober, MD;   Location: [facility];  Service: General;  Laterality: N/A;  ? LEFT HEART CATH AND CORONARY ANGIOGRAPHY N/A 01/02/2021  ? Procedure: LEFT HEART CATH AND CORONARY ANGIOGRAPHY;  Surgeon: Jettie Booze, MD;  Location: [facility];  Service: Cardiovascular;  Laterality: N/A;  ? LYSIS OF ADHESION  02/20/2021  ? Procedure: LYSIS OF ADHESION;  Surgeon: Kinsinger, Arta Bruce, MD;  Location: [facility];  Service: General;;  ? VENTRAL HERNIA REPAIR N/A 04/26/2017  ? Procedure: INCARCERATED HERNIA REPAIR VENTRAL ADULT;  Surgeon: Georganna Skeans, MD;  Location: [facility];  Service: General;  Laterality: N/A;  ? VENTRAL HERNIA REPAIR N/A 02/20/2021  ? Procedure: EXPLORATORY LAPAROTOMY; RECURRENT HERNIA REPAIR WITH OVERLAY MESH;  Surgeon: Kinsinger, Arta Bruce, MD;  Location: [facility];  Service: General;  Laterality: N/A;  ? ?Current Outpatient Medications  ?Medication Sig Dispense Refill  ? ACCU-CHEK GUIDE test strip USE AS DIRECTED UP TO 4 TIMES DAILY (Patient taking differently: 1 each by Other route 4 (four) times daily.) 100 strip 3  ? acetaminophen (TYLENOL) 500 MG tablet Take 500-1,000 mg by mouth every 6 (six) hours as needed for mild pain or headache.    ? atorvastatin (LIPITOR) 80 MG tablet TAKE 1 TABLET BY MOUTH EVERY DAY 30 tablet 5  ? blood glucose meter kit and supplies KIT Dispense based on patient and insurance preference. Use up to four times daily as directed. E11.65 (Patient taking differently: Inject 1 each into the skin See admin instructions. Dispense based on patient and insurance preference. Use up to four times daily as directed. E11.65) 1 each  0  ? clopidogrel (PLAVIX) 75 MG tablet TAKE 1 TABLET BY MOUTH DAILY WITH BREAKFAST. 30 tablet 5  ? empagliflozin (JARDIANCE) 10 MG TABS tablet Take 1 tablet (10 mg total) by mouth daily. 30 tablet 11  ? metFORMIN (GLUCOPHAGE-XR) 500 MG 24 hr tablet TAKE 2 TABLETS (1,000 MG TOTAL) BY MOUTH IN THE MORNING AND AT BEDTIME. 120 tablet 5  ? metoprolol succinate (TOPROL-XL) 50 MG  24 hr tablet Take 1 tablet (50 mg total) by mouth daily. 90 tablet 3  ? Multiple Vitamins-Minerals (CENTRUM SILVER 50+MEN) TABS Take 1 tablet by mouth daily with breakfast.    ? pantoprazole (PROTONIX) 40 MG tablet TAKE 1 TABLET BY MOUTH EVERY DAY 30 tablet 5  ? PRESCRIPTION MEDICATION Inhale 1 Device into the lungs See admin instructions. CPAP- At bedtime every night    ? ELIQUIS 5 MG TABS tablet TAKE 1 TABLET BY MOUTH TWICE A DAY 60 tablet 5  ? nitroGLYCERIN (NITROSTAT) 0.4 MG SL tablet Place 1 tablet (0.4 mg total) under the tongue every 5 (five) minutes as needed for chest pain. (Patient not taking: Reported on 09/13/2021) 25 tablet 1  ? ?Current Facility-Administered Medications  ?Medication Dose Route Frequency Provider Last Rate Last Admin  ? 0.9 %  sodium chloride infusion  500 mL Intravenous Once Mansouraty, Telford Nab., MD      ? ? ?Current Outpatient Medications:  ?  ACCU-CHEK GUIDE test strip, USE AS DIRECTED UP TO 4 TIMES DAILY (Patient taking differently: 1 each by Other route 4 (four) times daily.), Disp: 100 strip, Rfl: 3 ?  acetaminophen (TYLENOL) 500 MG tablet, Take 500-1,000 mg by mouth every 6 (six) hours as needed for mild pain or headache., Disp: , Rfl:  ?  atorvastatin (LIPITOR) 80 MG tablet, TAKE 1 TABLET BY MOUTH EVERY DAY, Disp: 30 tablet, Rfl: 5 ?  blood glucose meter kit and supplies KIT, Dispense based on patient and insurance preference. Use up to four times daily as directed. E11.65 (Patient taking differently: Inject 1 each into the skin See admin instructions. Dispense based on patient and insurance preference. Use up to four times daily as directed. E11.65), Disp: 1 each, Rfl: 0 ?  clopidogrel (PLAVIX) 75 MG tablet, TAKE 1 TABLET BY MOUTH DAILY WITH BREAKFAST., Disp: 30 tablet, Rfl: 5 ?  empagliflozin (JARDIANCE) 10 MG TABS tablet, Take 1 tablet (10 mg total) by mouth daily., Disp: 30 tablet, Rfl: 11 ?  metFORMIN (GLUCOPHAGE-XR) 500 MG 24 hr tablet, TAKE 2 TABLETS (1,000 MG TOTAL) BY  MOUTH IN THE MORNING AND AT BEDTIME., Disp: 120 tablet, Rfl: 5 ?  metoprolol succinate (TOPROL-XL) 50 MG 24 hr tablet, Take 1 tablet (50 mg total) by mouth daily., Disp: 90 tablet, Rfl: 3 ?  Multiple Vitamins-Minerals (CENTRUM SILVER 50+MEN) TABS, Take 1 tablet by mouth daily with breakfast., Disp: , Rfl:  ?  pantoprazole (PROTONIX) 40 MG tablet, TAKE 1 TABLET BY MOUTH EVERY DAY, Disp: 30 tablet, Rfl: 5 ?  PRESCRIPTION MEDICATION, Inhale 1 Device into the lungs See admin instructions. CPAP- At bedtime every night, Disp: , Rfl:  ?  ELIQUIS 5 MG TABS tablet, TAKE 1 TABLET BY MOUTH TWICE A DAY, Disp: 60 tablet, Rfl: 5 ?  nitroGLYCERIN (NITROSTAT) 0.4 MG SL tablet, Place 1 tablet (0.4 mg total) under the tongue every 5 (five) minutes as needed for chest pain. (Patient not taking: Reported on 09/13/2021), Disp: 25 tablet, Rfl: 1 ? ?Current Facility-Administered Medications:  ?  0.9 %  sodium chloride infusion,  500 mL, Intravenous, Once, Mansouraty, Telford Nab., MD ?No Known Allergies ?Family History  ?Problem Relation Age of Onset  ? Diabetes Mother   ?     died age 10- also bleeding ulcer  ? Hypertension Mother   ? Heart failure Father   ?     in his 11s (patient was 14). day before surgery planned  ? Other Maternal Grandmother   ?     states natural causes all grandparents  ? Colon cancer Neg Hx   ? Esophageal cancer Neg Hx   ? Inflammatory bowel disease Neg Hx   ? Liver disease Neg Hx   ? Pancreatic cancer Neg Hx   ? Rectal cancer Neg Hx   ? Stomach cancer Neg Hx   ? ?Social History  ? ?Socioeconomic History  ? Marital status: Single  ?  Spouse name: Not on file  ? Number of children: Not on file  ? Years of education: Not on file  ? Highest education level: Not on file  ?Occupational History  ? 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  Comment: [employer]  ?Tobacco Use  ? Smoking status: Never  ? Smokeless tobacco: Never  ?Vaping Use  ? Vaping Use: Never used  ?Substance and Sexual Activity  ?  Alcohol use: No  ?  Alcohol/week: 0.0 standard drinks  ? Drug use: No  ? Sexual activity: Not Currently  ?Other Topics Concern  ? Not on file  ?Social History Narrative  ? Single. Lives alone. Friends t

## 2021-09-13 NOTE — Op Note (Signed)
[place] ?Patient Name: Evan Leonard ?Procedure Date: 09/13/2021 3:08 PM ?MRN: [id number] ?Endoscopist: Justice Britain , MD ?Age: 61 ?Referring MD:  ?Date of Birth: 1961-03-31 ?Gender: Male ?Account #: [id number] ?Procedure:                Upper GI endoscopy ?Indications:              Epigastric abdominal distress, Indigestion,  ?                          Abdominal bloating ?Medicines:                Monitored Anesthesia Care ?Procedure:                Pre-Anesthesia Assessment: ?                          - Prior to the procedure, a History and Physical  ?                          was performed, and patient medications and  ?                          allergies were reviewed. The patient's tolerance of  ?                          previous anesthesia was also reviewed. The risks  ?                          and benefits of the procedure and the sedation  ?                          options and risks were discussed with the patient.  ?                          All questions were answered, and informed consent  ?                          was obtained. Prior Anticoagulants: The patient  ?                          last took Eliquis (apixaban) 2 days and Plavix  ?                          (clopidogrel) 5 days prior to the procedure. ASA  ?                          Grade Assessment: III - A patient with severe  ?                          systemic disease. After reviewing the risks and  ?                          benefits, the patient was deemed in satisfactory  ?  condition to undergo the procedure. ?                          After obtaining informed consent, the endoscope was  ?                          passed under direct vision. Throughout the  ?                          procedure, the patient's blood pressure, pulse, and  ?                          oxygen saturations were monitored continuously. The  ?                          GIF [id number] #[id number] was introduced through the  ?                           mouth, and advanced to the second part of duodenum.  ?                          The upper GI endoscopy was accomplished without  ?                          difficulty. The patient tolerated the procedure. ?Scope In: ?Scope Out: ?Findings:                 No gross lesions were noted in the entire esophagus. ?                          The Z-line was irregular and was found 39 cm from  ?                          the incisors. ?                          A 2 cm hiatal hernia was present. ?                          Patchy mildly erythematous mucosa without bleeding  ?                          was found in the entire examined stomach. Biopsies  ?                          were taken with a cold forceps for histology and  ?                          Helicobacter pylori testing. ?                          No gross lesions were noted in the duodenal bulb,  ?                          in  the first portion of the duodenum and in the  ?                          second portion of the duodenum. Biopsies were taken  ?                          with a cold forceps for histology. ?Complications:            No immediate complications. ?Estimated Blood Loss:     Estimated blood loss was minimal. ?Impression:               - No gross lesions in esophagus. ?                          - Z-line irregular, 39 cm from the incisors. ?                          - 2 cm hiatal hernia. ?                          - Erythematous mucosa in the stomach. Biopsied. ?                          - No gross lesions in the duodenal bulb, in the  ?                          first portion of the duodenum and in the second  ?                          portion of the duodenum. Biopsied. ?Recommendation:           - Proceed to scheduled colonoscopy. ?                          - Await pathology results. ?                          - The findings and recommendations were discussed  ?                          with the patient. ?Justice Britain,  MD ?09/13/2021 4:18:37 PM ?

## 2021-09-15 ENCOUNTER — Other Ambulatory Visit: Payer: Self-pay | Admitting: Family Medicine

## 2021-09-17 ENCOUNTER — Telehealth: Payer: Self-pay | Admitting: *Deleted

## 2021-09-17 ENCOUNTER — Telehealth: Payer: Self-pay

## 2021-09-17 NOTE — Telephone Encounter (Signed)
First attempt, left VM.  

## 2021-09-17 NOTE — Telephone Encounter (Signed)
Attempted 2nd f/u call. No answer, left VM. ?

## 2021-09-24 ENCOUNTER — Encounter: Payer: Self-pay | Admitting: Gastroenterology

## 2021-10-28 ENCOUNTER — Other Ambulatory Visit: Payer: Self-pay | Admitting: Family Medicine

## 2021-10-30 ENCOUNTER — Ambulatory Visit (HOSPITAL_COMMUNITY)
Admission: EM | Admit: 2021-10-30 | Discharge: 2021-10-30 | Disposition: A | Discharge status: Home / Self Care | Payer: PRIVATE HEALTH INSURANCE | Attending: Emergency Medicine | Admitting: Emergency Medicine

## 2021-10-30 ENCOUNTER — Encounter (HOSPITAL_COMMUNITY): Payer: Self-pay

## 2021-10-30 DIAGNOSIS — N3000 Acute cystitis without hematuria: Secondary | ICD-10-CM | POA: Diagnosis present

## 2021-10-30 LAB — POCT URINALYSIS DIPSTICK, ED / UC
Bilirubin Urine: NEGATIVE
Glucose, UA: >=1000 mg/dL — AB
Ketones, ur: NEGATIVE mg/dL
Nitrite: POSITIVE — AB
Protein, ur: NEGATIVE mg/dL
Specific Gravity, Urine: 1.02 (ref 1.005–1.030)
Urobilinogen, UA: 0.2 mg/dL (ref 0.0–1.0)
pH: 5.5 (ref 5.0–8.0)

## 2021-10-30 MED ORDER — LEVOFLOXACIN 500 MG PO TABS
500.0000 mg | ORAL_TABLET | Freq: Every day | ORAL | 0 refills | Status: AC
Start: 2021-10-30 — End: 2021-11-09

## 2021-10-30 NOTE — Discharge Instructions (Signed)
Your urinalysis shows J[place] Bear blood cells and nitrates which are indicative of infection, your urine will be sent to the lab to determine exactly which bacteria is present, if any changes need to be made to your medications you will be notified  Begin use of Levaquin every morning for 10 days, this medication has the potential to cause diarrhea, this will fix itself once medication is complete, you may use over-the-counter Imodium as needed to slow diarrhea and please increase your fluid intake to prevent dehydration  Increase your fluid intake through use of water  As always practice good hygiene, wiping front to back and avoidance of scented vaginal products to prevent further irritation  If symptoms continue to persist after use of medication or recur please follow-up with urgent care or your primary doctor as needed  As you have had 3 urinary infections within the last year I would recommend follow-up and evaluation by a urologist which is a bladder specialist to assess for any further causes of recurrent symptoms

## 2021-10-30 NOTE — ED Triage Notes (Signed)
Onset 3 days ago. Patient having urinary burning, cloudy urine, abnormal odor to the urine, lower abdominal pain, also prostate pain.   Patient has no known history of prostate issues. Has history of UTIs.

## 2021-10-30 NOTE — ED Provider Notes (Signed)
[facility]    CSN: [id number] Arrival date & time: 10/30/21  1640      History   Chief Complaint Chief Complaint  Patient presents with   Urinary Tract Infection    HPI Evan Leonard is a 61 y.o. male.   Patient presents with chills, dysuria, urinary frequency, urgency, cloudy and malodorous urine with discomfort to the prostate for 3 days .  Has attempted use of Tylenol which has been minimally effective.  Endorses history of reoccurring infections with last known and March 2023.  Denies hematuria, lower abdominal pain or pressure, flank pain, fever, penile discharge, penile or testicle swelling.  Patient is established with a urologist but has not been evaluated for reoccurring UTI.   Past Medical History:  Diagnosis Date   CAD (coronary artery disease)    Diabetes mellitus, type II ([place]) 02/2013   Diabetic ulcer of right foot due to type 2 diabetes mellitus ([place]) 03/21/2018   DVT (deep venous thrombosis) ([place]) 05/04/2012   LLE   Exertional dyspnea 05/04/2012   "isolated episode" (05/05/2012)   Hepatic steatosis    History of chickenpox    Hypertension 03/21/2018   Obesity, Class III, BMI 40-49.9 (morbid obesity) ([place]) 03/21/2018   OSA on CPAP    Peripheral vascular disease (H[facility])    varicose veins   Pulmonary embolism ([place]) 05/04/2012   bilaterally   Pyelonephritis 04/2017   Small bowel obstruction ([place]) 09/06/2015   Varicose veins     Patient Active Problem List   Diagnosis Date Noted   Positive colorectal cancer screening using Cologuard test 08/09/2021   Colon cancer screening 08/09/2021   Acid indigestion 08/09/2021   Antiplatelet or antithrombotic long-term use 08/09/2021   Bloating symptom 08/09/2021   Hematochezia 08/09/2021   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 acute cor pulmonale ([place]) 06/05/2021   Pressure injury of skin 02/25/2021   Chronic anticoagulation 02/19/2021   History of DVT (deep vein thrombosis) 02/19/2021   CAD  (coronary artery disease) 01/09/2021   Eczema 12/27/2019   Erectile dysfunction 12/27/2019   SBO (small bowel obstruction) ([place]) 08/20/2018   History of diabetic ulcer of foot 03/21/2018   Atypical chest pain 02/09/2018   Renal stone 05/12/2017   S/P repair of recurrent ventral hernia 04/26/2017   History of pulmonary embolism 07/16/2016   Chronic venous insufficiency 01/25/2015   Incarcerated ventral hernia 12/13/2013   Diabetes mellitus with hyperglycemia ([place]) 03/31/2013   Leukocytosis 03/23/2013   Hyponatremia 03/23/2013   Morbid obesity with body mass index of 40.0-44.9 in adult [facility]) 05/05/2012   Obstructive sleep apnea 05/05/2012   DVT of left distal popliteal vein 05/05/2012    Past Surgical History:  Procedure Laterality Date   BOWEL RESECTION N/A 02/20/2021   Procedure: SMALL BOWEL RESECTION;  Surgeon: Mickeal Skinner, MD;  Location: [facility];  Service: General;  Laterality: N/A;   CORONARY STENT INTERVENTION N/A 01/02/2021   Procedure: CORONARY STENT INTERVENTION;  Surgeon: Jettie Booze, MD;  Location: [facility];  Service: Cardiovascular;  Laterality: N/A;   CYSTOSCOPY W/ URETERAL STENT PLACEMENT Right 05/12/2017   Procedure: CYSTOSCOPY WITH RETROGRADE PYELOGRAM/URETERAL STENT PLACEMENT;  Surgeon: [name], MD;  Location: [facility];  Service: Urology;  Laterality: Right;   CYSTOSCOPY/URETEROSCOPY/HOLMIUM LASER/STENT PLACEMENT Right 07/29/2017   Procedure: CYSTOSCOPY RIGHT URETEROSCOPY HOLMIUM LASER STENT EXCHANGE;  Surgeon: [name], MD;  Location: [facility];  Service: Urology;  Laterality: Right;   HERNIA REPAIR     2015   LAPAROTOMY  N/A 12/13/2013   Procedure: EXPLORATORY LAPAROTOMY Repair ventral hernia, without mesh, partial omentectomy;  Surgeon: Gwenyth Ober, MD;  Location: [facility];  Service: General;  Laterality: N/A;   LEFT HEART CATH AND CORONARY ANGIOGRAPHY N/A 01/02/2021   Procedure: LEFT HEART CATH AND CORONARY ANGIOGRAPHY;  Surgeon: Jettie Booze, MD;  Location: [facility];  Service: Cardiovascular;  Laterality: N/A;   LYSIS OF ADHESION  02/20/2021   Procedure: LYSIS OF ADHESION;  Surgeon: Kieth Brightly, Arta Bruce, MD;  Location: [facility];  Service: General;;   VENTRAL HERNIA REPAIR N/A 04/26/2017   Procedure: INCARCERATED HERNIA REPAIR VENTRAL ADULT;  Surgeon: Georganna Skeans, MD;  Location: [facility];  Service: General;  Laterality: N/A;   VENTRAL HERNIA REPAIR N/A 02/20/2021   Procedure: EXPLORATORY LAPAROTOMY; RECURRENT HERNIA REPAIR WITH OVERLAY MESH;  Surgeon: Kinsinger, Arta Bruce, MD;  Location: [facility];  Service: General;  Laterality: N/A;       Home Medications    Prior to Admission medications   Medication Sig Start Date End Date Taking? Authorizing Provider  acetaminophen (TYLENOL) 500 MG tablet Take 500-1,000 mg by mouth every 6 (six) hours as needed for mild pain or headache.   Yes [provider]  atorvastatin (LIPITOR) 80 MG tablet TAKE 1 TABLET BY MOUTH EVERY DAY 07/16/21  Yes [name], MD  clopidogrel (PLAVIX) 75 MG tablet TAKE 1 TABLET BY MOUTH EVERY DAY WITH BREAKFAST 09/17/21  Yes [name], MD  ELIQUIS 5 MG TABS tablet TAKE 1 TABLET BY MOUTH TWICE A DAY 10/29/21  Yes [name], MD  empagliflozin (JARDIANCE) 10 MG TABS tablet Take 1 tablet (10 mg total) by mouth daily. 01/19/21  Yes [name], MD  metFORMIN (GLUCOPHAGE-XR) 500 MG 24 hr tablet TAKE 2 TABLETS (1,000 MG TOTAL) BY MOUTH IN THE MORNING AND AT BEDTIME. 07/12/21  Yes [name], MD  metoprolol succinate (TOPROL-XL) 50 MG 24 hr tablet Take 1 tablet (50 mg total) by mouth daily. 01/09/21  Yes [name], MD  Multiple Vitamins-Minerals (CENTRUM SILVER 50+MEN) TABS Take 1 tablet by mouth daily with breakfast.   Yes [provider]  pantoprazole (PROTONIX) 40 MG tablet TAKE 1 TABLET BY MOUTH EVERY DAY 07/16/21  Yes [name], MD  PRESCRIPTION MEDICATION Inhale 1 Device into the lungs See  admin instructions. CPAP- At bedtime every night   Yes [provider]  ACCU-CHEK GUIDE test strip USE AS DIRECTED UP TO 4 TIMES DAILY Patient taking differently: 1 each by Other route 4 (four) times daily. 12/30/19   [name], MD  blood glucose meter kit and supplies KIT Dispense based on patient and insurance preference. Use up to four times daily as directed. E11.65 Patient taking differently: Inject 1 each into the skin See admin instructions. Dispense based on patient and insurance preference. Use up to four times daily as directed. E11.65 12/09/19   [name], MD  nitroGLYCERIN (NITROSTAT) 0.4 MG SL tablet Place 1 tablet (0.4 mg total) under the tongue every 5 (five) minutes as needed for chest pain. 01/03/21   Elgergawy, Silver Huguenin, MD    Family History Family History  Problem Relation Age of Onset   Diabetes Mother        died age 20- also bleeding ulcer   Hypertension Mother    Heart failure Father        in his 21s (patient was 55). day before surgery planned   Other  Maternal Grandmother        states natural causes all grandparents   Colon cancer Neg Hx    Esophageal cancer Neg Hx    Inflammatory bowel disease Neg Hx    Liver disease Neg Hx    Pancreatic cancer Neg Hx    Rectal cancer Neg Hx    Stomach cancer Neg Hx     Social History Social History   Tobacco Use   Smoking status: Never   Smokeless tobacco: Never  Vaping Use   Vaping Use: Never used  Substance Use Topics   Alcohol use: No    Alcohol/week: 0.0 standard drinks   Drug use: No     Allergies   Patient has no known allergies.   Review of Systems Review of Systems  Constitutional: Negative.   Respiratory: Negative.    Cardiovascular: Negative.   Genitourinary:  Positive for dysuria, frequency and urgency. Negative for decreased urine volume, difficulty urinating, enuresis, flank pain, genital sores, hematuria, penile discharge, penile pain, penile swelling, scrotal swelling  and testicular pain.  Skin: Negative.     Physical Exam Triage Vital Signs ED Triage Vitals  Enc Vitals Group     BP 10/30/21 1725 (!) 144/89     Pulse Rate 10/30/21 1725 82     Resp 10/30/21 1725 16     Temp 10/30/21 1725 98.2 F (36.8 C)     Temp Source 10/30/21 1725 Oral     SpO2 10/30/21 1725 95 %     Weight 10/30/21 1727 (!) 335 lb (152 kg)     Height 10/30/21 1727 '6\' 3"'  (1.905 m)     Head Circumference --      Peak Flow --      Pain Score 10/30/21 1727 5     Pain Loc --      Pain Edu? --      Excl. in [place]? --    No data found.  Updated Vital Signs BP (!) 144/89 (BP Location: Left Arm)   Pulse 82   Temp 98.2 F (36.8 C) (Oral)   Resp 16   Ht '6\' 3"'  (1.905 m)   Wt (!) 335 lb (152 kg)   SpO2 95%   BMI 41.87 kg/m   Visual Acuity Right Eye Distance:   Left Eye Distance:   Bilateral Distance:    Right Eye Near:   Left Eye Near:    Bilateral Near:     Physical Exam Constitutional:      Appearance: Normal appearance.  HENT:     Head: Normocephalic.  Eyes:     Extraocular Movements: Extraocular movements intact.  Pulmonary:     Effort: Pulmonary effort is normal.  Abdominal:     General: Abdomen is flat. Bowel sounds are normal. There is no distension.     Palpations: Abdomen is soft.     Tenderness: There is no abdominal tenderness. There is no right CVA tenderness or left CVA tenderness.  Skin:    General: Skin is warm and dry.  Neurological:     Mental Status: He is alert and oriented to person, place, and time. Mental status is at baseline.  Psychiatric:        Mood and Affect: Mood normal.        Behavior: Behavior normal.     UC Treatments / Results  Labs (all labs ordered are listed, but only abnormal results are displayed) Labs Reviewed  POCT URINALYSIS DIPSTICK, ED / UC - Abnormal; Notable for  the following components:      Result Value   Glucose, UA >=1000 (*)    Hgb urine dipstick TRACE (*)    Nitrite POSITIVE (*)    Leukocytes,Ua  TRACE (*)    All other components within normal limits  URINE CULTURE    EKG   Radiology No results found.  Procedures Procedures (including critical care time)  Medications Ordered in UC Medications - No data to display  Initial Impression / Assessment and Plan / UC Course  I have reviewed the triage vital signs and the nursing notes.  Pertinent labs & imaging results that were available during my care of the patient were reviewed by me and considered in my medical decision making (see chart for details).  Acute cystitis without hematuria  Vital signs stable, no indications of sepsis, no tenderness noted on abdominal exam, urinalysis showing nitrates and [place] blood cells, sent for culture, discussed findings with patient, antibiotic today based on urine culture in March 2023, Levaquin 10-day course prescribed, recommended increase fluid intake and discussed possibility of diarrhea occurring, may use over-the-counter Imodium at that time, may continue use of Tylenol for general comfort given strict precautions that if symptoms continue to persist or worsen to follow-up with urgent care or PCP as needed for reevaluation, given walking referral to urology if patient is not able to be seen by his own due to at least 3 reoccurring infections within the last year. Final Clinical Impressions(s) / UC Diagnoses   Final diagnoses:  None   Discharge Instructions   None    ED Prescriptions   None    PDMP not reviewed this encounter.   Hans Eden, [place] 10/30/21 [phone]

## 2021-11-01 LAB — URINE CULTURE: Culture: 60000 — AB

## 2021-11-16 ENCOUNTER — Other Ambulatory Visit: Payer: Self-pay | Admitting: Family Medicine

## 2021-11-22 LAB — HM DIABETES EYE EXAM

## 2021-11-23 ENCOUNTER — Encounter: Payer: Self-pay | Admitting: Family Medicine

## 2021-11-23 ENCOUNTER — Ambulatory Visit (INDEPENDENT_AMBULATORY_CARE_PROVIDER_SITE_OTHER): Payer: PRIVATE HEALTH INSURANCE | Admitting: Family Medicine

## 2021-11-23 VITALS — BP 134/86 | HR 80 | Temp 98.8°F | Resp 16 | Ht 75.0 in | Wt 346.0 lb

## 2021-11-23 DIAGNOSIS — Z0000 Encounter for general adult medical examination without abnormal findings: Secondary | ICD-10-CM | POA: Diagnosis not present

## 2021-11-23 DIAGNOSIS — R361 Hematospermia: Secondary | ICD-10-CM

## 2021-11-23 DIAGNOSIS — E1165 Type 2 diabetes mellitus with hyperglycemia: Secondary | ICD-10-CM | POA: Diagnosis not present

## 2021-11-23 DIAGNOSIS — Z79899 Other long term (current) drug therapy: Secondary | ICD-10-CM

## 2021-11-23 DIAGNOSIS — Z23 Encounter for immunization: Secondary | ICD-10-CM

## 2021-11-23 DIAGNOSIS — Z125 Encounter for screening for malignant neoplasm of prostate: Secondary | ICD-10-CM

## 2021-11-23 DIAGNOSIS — E1169 Type 2 diabetes mellitus with other specified complication: Secondary | ICD-10-CM

## 2021-11-23 DIAGNOSIS — E785 Hyperlipidemia, unspecified: Secondary | ICD-10-CM

## 2021-11-23 MED ORDER — OZEMPIC (0.25 OR 0.5 MG/DOSE) 2 MG/1.5ML ~~LOC~~ SOPN: PEN_INJECTOR | SUBCUTANEOUS | 0 refills | Status: AC

## 2021-11-23 NOTE — Patient Instructions (Addendum)
Shingrix # 2 today  We will call you within two weeks about your referral to [facility]. If you do not hear within 2 weeks, give [place] a call.   STOP jardiance Start ozempic 0.25 mg for 4 weeks 0.5 mg for 4 weeks (Sent in a prescription for this) - I think you have 1 mg at home which would be the next step   Please stop by lab before you go If you have mychart- we will send your results within 3 business days of [place] receiving them.  If you do not have mychart- we will call you about results within 5 business days of [place] receiving them.  *please also note that you will see labs on mychart as soon as they post. I will later go in and write notes on them- will say "notes from Dr. Yong Channel"   Recommended follow up: Return in about 4 months (around 03/25/2022) for followup or sooner if needed.Schedule b4 you leave.

## 2021-11-23 NOTE — Progress Notes (Signed)
Phone: [phone]   Subjective:  Patient presents today for their annual physical. Chief complaint-noted.   See problem oriented charting- ROS- full  review of systems was completed and negative  except for: sweating, fatigue, leg swelling, shortness of rbeath, nausea, rash, lightheaded, blood in urine  The following were reviewed and entered/updated in epic: Past Medical History:  Diagnosis Date   CAD (coronary artery disease)    Diabetes mellitus, type II ([place]) 02/2013   Diabetic ulcer of right foot due to type 2 diabetes mellitus ([place]) 03/21/2018   DVT (deep venous thrombosis) ([place]) 05/04/2012   LLE   Exertional dyspnea 05/04/2012   "isolated episode" (05/05/2012)   Hepatic steatosis    History of chickenpox    Hypertension 03/21/2018   Obesity, Class III, BMI 40-49.9 (morbid obesity) ([place]) 03/21/2018   OSA on CPAP    Peripheral vascular disease (HCC)    varicose veins   Pulmonary embolism ([place]) 05/04/2012   bilaterally   Pyelonephritis 04/2017   Small bowel obstruction ([place]) 09/06/2015   Varicose veins    Patient Active Problem List   Diagnosis Date Noted   CAD (coronary artery disease) 01/09/2021    Priority: High   Atypical chest pain 02/09/2018    Priority: High   History of pulmonary embolism 07/16/2016    Priority: High   Diabetes mellitus with hyperglycemia ([place]) 03/31/2013    Priority: High   Morbid obesity with body mass index of 40.0-44.9 in adult [facility]) 05/05/2012    Priority: High   DVT of left distal popliteal vein 05/05/2012    Priority: High   History of diabetic ulcer of foot 03/21/2018    Priority: Medium    S/P repair of recurrent ventral hernia 04/26/2017    Priority: Medium    Chronic venous insufficiency 01/25/2015    Priority: Medium    Obstructive sleep apnea 05/05/2012    Priority: Medium    Eczema 12/27/2019    Priority: Low   Erectile dysfunction 12/27/2019    Priority: Low   Renal stone 05/12/2017    Priority: Low    Hyponatremia 03/23/2013    Priority: Low   Positive colorectal cancer screening using Cologuard test 08/09/2021   Colon cancer screening 08/09/2021   Acid indigestion 08/09/2021   Antiplatelet or antithrombotic long-term use 08/09/2021   Bloating symptom 08/09/2021   Hematochezia 08/09/2021   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 acute cor pulmonale ([place]) 06/05/2021   Pressure injury of skin 02/25/2021   Chronic anticoagulation 02/19/2021   History of DVT (deep vein thrombosis) 02/19/2021   SBO (small bowel obstruction) ([place]) 08/20/2018   Incarcerated ventral hernia 12/13/2013   Leukocytosis 03/23/2013   Past Surgical History:  Procedure Laterality Date   BOWEL RESECTION N/A 02/20/2021   Procedure: SMALL BOWEL RESECTION;  Surgeon: Mickeal Skinner, MD;  Location: [place];  Service: General;  Laterality: N/A;   CORONARY STENT INTERVENTION N/A 01/02/2021   Procedure: CORONARY STENT INTERVENTION;  Surgeon: Jettie Booze, MD;  Location: [place] [facility];  Service: Cardiovascular;  Laterality: N/A;   CYSTOSCOPY W/ URETERAL STENT PLACEMENT Right 05/12/2017   Procedure: CYSTOSCOPY WITH RETROGRADE PYELOGRAM/URETERAL STENT PLACEMENT;  Surgeon: [name], MD;  Location: [place];  Service: Urology;  Laterality: Right;   CYSTOSCOPY/URETEROSCOPY/HOLMIUM LASER/STENT PLACEMENT Right 07/29/2017   Procedure: CYSTOSCOPY RIGHT URETEROSCOPY HOLMIUM LASER STENT EXCHANGE;  Surgeon: [name], MD;  Location: [facility];  Service: Urology;  Laterality: Right;   HERNIA REPAIR     2015   LAPAROTOMY  N/A 12/13/2013   Procedure: EXPLORATORY LAPAROTOMY Repair ventral hernia, without mesh, partial omentectomy;  Surgeon: Gwenyth Ober, MD;  Location: [facility];  Service: General;  Laterality: N/A;   LEFT HEART CATH AND CORONARY ANGIOGRAPHY N/A 01/02/2021   Procedure: LEFT HEART CATH AND CORONARY ANGIOGRAPHY;  Surgeon: Jettie Booze, MD;  Location: [place] [facility];  Service: Cardiovascular;  Laterality:  N/A;   LYSIS OF ADHESION  02/20/2021   Procedure: LYSIS OF ADHESION;  Surgeon: Kieth Brightly, Arta Bruce, MD;  Location: [place];  Service: General;;   VENTRAL HERNIA REPAIR N/A 04/26/2017   Procedure: INCARCERATED HERNIA REPAIR VENTRAL ADULT;  Surgeon: Georganna Skeans, MD;  Location: [facility];  Service: General;  Laterality: N/A;   VENTRAL HERNIA REPAIR N/A 02/20/2021   Procedure: EXPLORATORY LAPAROTOMY; RECURRENT HERNIA REPAIR WITH OVERLAY MESH;  Surgeon: Kinsinger, Arta Bruce, MD;  Location: [facility];  Service: General;  Laterality: N/A;    Family History  Problem Relation Age of Onset   Diabetes Mother        died age 70- also bleeding ulcer   Hypertension Mother    Heart failure Father        in his 72s (patient was 84). day before surgery planned   Other Maternal Grandmother        states natural causes all grandparents   Colon cancer Neg Hx    Esophageal cancer Neg Hx    Inflammatory bowel disease Neg Hx    Liver disease Neg Hx    Pancreatic cancer Neg Hx    Rectal cancer Neg Hx    Stomach cancer Neg Hx     Medications- reviewed and updated Current Outpatient Medications  Medication Sig Dispense Refill   ACCU-CHEK GUIDE test strip USE AS DIRECTED UP TO 4 TIMES DAILY (Patient taking differently: 1 each by Other route 4 (four) times daily.) 100 strip 3   acetaminophen (TYLENOL) 500 MG tablet Take 500-1,000 mg by mouth every 6 (six) hours as needed for mild pain or headache.     atorvastatin (LIPITOR) 80 MG tablet TAKE 1 TABLET BY MOUTH EVERY DAY 30 tablet 5   blood glucose meter kit and supplies KIT Dispense based on patient and insurance preference. Use up to four times daily as directed. E11.65 (Patient taking differently: Inject 1 each into the skin See admin instructions. Dispense based on patient and insurance preference. Use up to four times daily as directed. E11.65) 1 each 0   clopidogrel (PLAVIX) 75 MG tablet TAKE 1 TABLET BY MOUTH EVERY DAY WITH BREAKFAST 90 tablet 1   ELIQUIS 5 MG  TABS tablet TAKE 1 TABLET BY MOUTH TWICE A DAY 60 tablet 5   metFORMIN (GLUCOPHAGE-XR) 500 MG 24 hr tablet TAKE 2 TABLETS (1,000 MG TOTAL) BY MOUTH IN THE MORNING AND AT BEDTIME. 360 tablet 1   metoprolol succinate (TOPROL-XL) 50 MG 24 hr tablet Take 1 tablet (50 mg total) by mouth daily. 90 tablet 3   Multiple Vitamins-Minerals (CENTRUM SILVER 50+MEN) TABS Take 1 tablet by mouth daily with breakfast.     nitroGLYCERIN (NITROSTAT) 0.4 MG SL tablet Place 1 tablet (0.4 mg total) under the tongue every 5 (five) minutes as needed for chest pain. 25 tablet 1   pantoprazole (PROTONIX) 40 MG tablet TAKE 1 TABLET BY MOUTH EVERY DAY 30 tablet 5   PRESCRIPTION MEDICATION Inhale 1 Device into the lungs See admin instructions. CPAP- At bedtime every night     Semaglutide,0.25 or 0.5MG /DOS, (OZEMPIC, 0.25 OR  0.5 MG/DOSE,) 2 MG/1.5ML SOPN Inject 0.25 mg as directed once a week for 28 days, THEN 0.5 mg once a week for 28 days. 2.25 mL 0   No current facility-administered medications for this visit.    Allergies-reviewed and updated No Known Allergies  Social History   Social History Narrative   Single. Lives alone. Friends that live close. No pets.       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       Hobbies: golf, basketball, walking   Tired from work right now   Objective  Objective:  BP 134/86   Pulse 80   Temp 98.8 F (37.1 C) (Temporal)   Resp 16   Ht $R'6\' 3"'qO$  (1.905 m)   Wt (!) 346 lb (156.9 kg)   SpO2 99%   BMI 43.25 kg/m  Gen: NAD, resting comfortably HEENT: Mucous membranes are moist. Oropharynx normal Neck: no thyromegaly CV: RRR no murmurs rubs or gallops Lungs: CTAB no crackles, wheeze, rhonchi Abdomen: soft/nontender/nondistended/normal bowel sounds. No rebound or guarding.  Ext: no edema Skin: warm, dry Neuro: grossly normal, moves all extremities, PERRLA GU: mild pain right groin deep palpation- mild bulge with valsalva   Assessment and Plan  61 y.o. male  presenting for annual physical.  Health Maintenance counseling: 1. Anticipatory guidance: Patient counseled regarding regular dental exams -advised q6 months (behind), eye exams -yearly,  avoiding smoking and second hand smoke , limiting alcohol to 2 beverages per day - doesn't drink, no illicit drugs.   2. Risk factor reduction:  Advised patient of need for regular exercise and diet rich and fruits and vegetables to reduce risk of heart attack and stroke.  Exercise- with fatigue lately hasnt had the energy.  Diet/weight management-working on increasing veggies, reduce fats, carbs.  Morbid obesity noted with BMI over 40-weight up 15 pounds from last physical in 2021 Wt Readings from Last 3 Encounters:  11/23/21 (!) 346 lb (156.9 kg)  10/30/21 (!) 335 lb (152 kg)  09/13/21 (!) 335 lb (152 kg)  3. Immunizations/screenings/ancillary studies-Shingrix No. 2 today  Immunization History  Administered Date(s) Administered   Influenza Split 05/27/2012   Influenza,inj,Quad PF,6+ Mos 02/15/2013, 03/01/2014, 06/19/2015, 04/16/2016, 04/29/2017, 02/09/2018, 08/21/2018, 03/22/2021   PFIZER(Purple Top)SARS-COV-2 Vaccination 10/24/2019   PNEUMOCOCCAL CONJUGATE-20 03/22/2021   Pneumococcal Polysaccharide-23 03/31/2013, 06/19/2015   Tdap 05/09/2016   Zoster Recombinat (Shingrix) 06/05/2021   4. Prostate cancer screening- trend PSA with labs Lab Results  Component Value Date   PSA 1.1 12/27/2019   5. Colon cancer screening - 09/13/21 and told 7 year follow up due to polyp history 6. Skin cancer screening- no dermatologist. advised regular sunscreen use. Denies worrisome, changing, or new skin lesions.  7. Smoking associated screening (lung cancer screening, AAA screen 65-75, UA)- never smoker 8. STD screening - states always using protection if active  Status of chronic or acute concerns   #intermittent slightly red rash on his forearms and onto upper arms at times. Mildly itchy. Years of issues- not  worsening. Hydrocortisone creams. Notes everyday even when not working -offered derm referral and wants to hold off - he wants to do this if worsens or persistent  #Fatigue- started about a month ago- feels fatigued even beyond his normal baseline for activities. Can work all day/stand all day and is really exhausted by afternoon. Using his cpap consistently. Occasionally mildly winded but nothing like prior heart issues- no chest pain(but mainly SOB previously). In morning no issues with breathing- more so by  late afternoon. Occasional lightheadedness with this. Stable edema. Also feels slightly sweaty in the PM  #Cloudy urine/hematospermia-noted at February visit and was treated for UTI.  Today patient reports continues to get blood in semen. Urine can get darken then lighten back with better hydration.   - with ongoing hematospermia even when was not actively having UTI- will [facility] to [facility] -also we will stop jardiance due to recurrent UTIs at least in march and June.   #CAD-NSTEMI in August 2022 secondary to severe mid RCA stenosis. This was treated with a drug eluting stent. LV function normal by echo. No chest pain. Will continue Plavix until August 2023 at which time we will stop his Plavix and start ASA. He is also on Eliquis.  #hyperlipidemia S: Medication:Atorvastatin 80 mg, Plavix 75 mg - Nitroglycerin on hand  -Also on metoprolol 50 mg extended release  - still some intermittent shortness of breath but nothing as severe as previous. No chest pain. Has not had nitroglycerin Lab Results  Component Value Date   CHOL 122 03/29/2021   HDL 31 (L) 03/29/2021   LDLCALC 69 03/29/2021   LDLDIRECT 78.0 03/22/2021   TRIG 123 03/29/2021   CHOLHDL 3.9 03/29/2021  A/P: CAD without chest pain. Some shortness of breath- want him to try nitroglycerin- if this provides relief want him to schedule follow up with cardiology (if other labs do not show a cause) - lipids well controlled on  last check- not yet quite a year- continue current meds  #Chronic pulmonary embolism/DVT S: Medication: Patient mains on Eliquis 5 mg twice daily due to history of recurrent DVT A/P: doubt PE with long term eliquis. Continue eliquis long term for recurrent DVT/PE    # Diabetes S: Medication:Jardiance 10 mg, metformin 1000 mg twice daily -Significant nausea issues in the past so we held onon Ozempic - Have discussed concerned about UTIs and continuing Jardiance  A/P: update a1c but with recurrent UTis need to stop jardiance. We will retrial ozempic- start with 0.25 mg dose for 4 weeks, 0.5mg  for 5 weeks, then 1 mg for 4 weeks and update me  # GERD S:Medication: Pantoprazole 40 mg. Reasonable control A/P: Controlled. Continue current medications.    #OSA-compliant with CPAP   Recommended follow up: Return in about 4 months (around 03/25/2022) for followup or sooner if needed.Schedule b4 you leave. Future Appointments  Date Time Provider [place]  02/18/2022  3:00 PM Burnell Blanks, MD [id number]   Lab/Order associations:NOT fasting   ICD-10-CM   1. Preventative health care  Z00.00     2. Type 2 diabetes mellitus with hyperglycemia, without long-term current use of insulin (HCC)  E11.65 CBC with Differential/Platelet    Comprehensive metabolic panel    Hemoglobin A1c    Microalbumin / creatinine urine ratio    CANCELED: Lipid panel    3. Hyperlipidemia associated with type 2 diabetes mellitus (HCC)  E11.69 TSH   E78.5 CANCELED: Lipid panel    4. Screening for prostate cancer  Z12.5 PSA    5. High risk medication use  Z79.899 B12      Meds ordered this encounter  Medications   Semaglutide,0.25 or 0.5MG /DOS, (OZEMPIC, 0.25 OR 0.5 MG/DOSE,) 2 MG/1.5ML SOPN    Sig: Inject 0.25 mg as directed once a week for 28 days, THEN 0.5 mg once a week for 28 days.    Dispense:  2.25 mL    Refill:  0    Please provide pen needles  Return precautions  advised.  Garret Reddish, MD

## 2021-11-23 NOTE — Addendum Note (Signed)
Addended byPauline Good, Everli Rother on: 11/23/2021 03:55 PM   Modules accepted: Orders

## 2021-11-24 LAB — CBC WITH DIFFERENTIAL/PLATELET
Absolute Monocytes: 844 cells/uL (ref 200–950)
Basophils Absolute: 58 cells/uL (ref 0–200)
Basophils Relative: 0.6 %
Eosinophils Absolute: 553 cells/uL — ABNORMAL HIGH (ref 15–500)
Eosinophils Relative: 5.7 %
HCT: 38.6 % (ref 38.5–50.0)
Hemoglobin: 12.5 g/dL — ABNORMAL LOW (ref 13.2–17.1)
Lymphs Abs: 2862 cells/uL (ref 850–3900)
MCH: 27.2 pg (ref 27.0–33.0)
MCHC: 32.4 g/dL (ref 32.0–36.0)
MCV: 84.1 fL (ref 80.0–100.0)
MPV: 11 fL (ref 7.5–12.5)
Monocytes Relative: 8.7 %
Neutro Abs: 5384 cells/uL (ref 1500–7800)
Neutrophils Relative %: 55.5 %
Platelets: 209 10*3/uL (ref 140–400)
RBC: 4.59 10*6/uL (ref 4.20–5.80)
RDW: 15.2 % — ABNORMAL HIGH (ref 11.0–15.0)
Total Lymphocyte: 29.5 %
WBC: 9.7 10*3/uL (ref 3.8–10.8)

## 2021-11-24 LAB — URINALYSIS, MICROSCOPIC ONLY
Bacteria, UA: NONE SEEN /HPF
Hyaline Cast: NONE SEEN /LPF
RBC / HPF: NONE SEEN /HPF (ref 0–2)
Squamous Epithelial / HPF: NONE SEEN /HPF (ref <=5)
WBC, UA: NONE SEEN /HPF (ref 0–5)

## 2021-11-24 LAB — TSH: TSH: 1.28 mIU/L (ref 0.40–4.50)

## 2021-11-24 LAB — HEMOGLOBIN A1C
Hgb A1c MFr Bld: 7.8 % of total Hgb — ABNORMAL HIGH (ref <5.7)
Mean Plasma Glucose: 177 mg/dL
eAG (mmol/L): 9.8 mmol/L

## 2021-11-24 LAB — COMPREHENSIVE METABOLIC PANEL
AG Ratio: 1.4 (calc) (ref 1.0–2.5)
ALT: 21 U/L (ref 9–46)
AST: 18 U/L (ref 10–35)
Albumin: 4.1 g/dL (ref 3.6–5.1)
Alkaline phosphatase (APISO): 63 U/L (ref 35–144)
BUN: 17 mg/dL (ref 7–25)
CO2: 25 mmol/L (ref 20–32)
Calcium: 9.1 mg/dL (ref 8.6–10.3)
Chloride: 105 mmol/L (ref 98–110)
Creat: 1.2 mg/dL (ref 0.70–1.35)
Globulin: 3 g/dL (calc) (ref 1.9–3.7)
Glucose, Bld: 109 mg/dL — ABNORMAL HIGH (ref 65–99)
Potassium: 4.3 mmol/L (ref 3.5–5.3)
Sodium: 142 mmol/L (ref 135–146)
Total Bilirubin: 0.6 mg/dL (ref 0.2–1.2)
Total Protein: 7.1 g/dL (ref 6.1–8.1)

## 2021-11-24 LAB — PSA: PSA: 0.84 ng/mL (ref <=4.00)

## 2021-11-24 LAB — MICROALBUMIN / CREATININE URINE RATIO
Creatinine, Urine: 92 mg/dL (ref 20–320)
Microalb Creat Ratio: 2 mcg/mg creat (ref <30)
Microalb, Ur: 0.2 mg/dL

## 2021-11-24 LAB — VITAMIN B12: Vitamin B-12: 273 pg/mL (ref 200–1100)

## 2021-12-17 ENCOUNTER — Ambulatory Visit (HOSPITAL_COMMUNITY)
Admission: EM | Admit: 2021-12-17 | Discharge: 2021-12-17 | Disposition: A | Discharge status: Home / Self Care | Payer: PRIVATE HEALTH INSURANCE | Attending: Internal Medicine | Admitting: Internal Medicine

## 2021-12-17 ENCOUNTER — Encounter (HOSPITAL_COMMUNITY): Payer: Self-pay | Admitting: Emergency Medicine

## 2021-12-17 DIAGNOSIS — N390 Urinary tract infection, site not specified: Secondary | ICD-10-CM

## 2021-12-17 DIAGNOSIS — R300 Dysuria: Secondary | ICD-10-CM | POA: Diagnosis present

## 2021-12-17 LAB — POCT URINALYSIS DIPSTICK, ED / UC
Bilirubin Urine: NEGATIVE
Glucose, UA: NEGATIVE mg/dL
Nitrite: NEGATIVE
Protein, ur: NEGATIVE mg/dL
Specific Gravity, Urine: 1.02 (ref 1.005–1.030)
Urobilinogen, UA: 0.2 mg/dL (ref 0.0–1.0)
pH: 5.5 (ref 5.0–8.0)

## 2021-12-17 MED ORDER — SULFAMETHOXAZOLE-TRIMETHOPRIM 800-160 MG PO TABS: 1.0000 | ORAL_TABLET | Freq: Two times a day (BID) | ORAL | 0 refills | Status: AC

## 2021-12-17 NOTE — Discharge Instructions (Addendum)
I am concerned since you continue to have urinary tract infections.  I have placed a referral to urology but I also recommend following with your primary care to see if they can place referral since we do not have someone who can follow this or provide additional documentation.  Call urology to see if they will allow you to schedule an appointment for further evaluation.  Start Bactrim DS twice daily.  If you develop any rash or oral lesions you need to stop the medication to be seen immediately.  Make sure you drink plenty of fluid.  If any does change your antibiotics we will contact you.  If you have any worsening symptoms including fever, nausea, vomiting, back pain, flank pain, blood in your urine, weakness you need to be seen immediately.

## 2021-12-17 NOTE — ED Provider Notes (Signed)
[facility]    CSN: [id number] Arrival date & time: 12/17/21  1610      History   Chief Complaint Chief Complaint  Patient presents with   Dysuria    HPI Evan Leonard is a 61 y.o. male.   Patient presents today with a 3-day history of UTI symptoms.  Reports dysuria, cloudy urine, malodorous urine, urgency.  Denies any pelvic pain, abdominal pain, fever, nausea, vomiting.  Patient was seen and treated for UTI 10/30/2021.  He was given levofloxacin and culture grew Staph epidermidis sensitive to fluoroquinolones.  He reports having multiple UTIs in the past few years.  He has not been evaluated by urology but is interested in referral.  Request that his insurance requires referral and is requesting that we place this today.  He does have a history of nephrolithiasis but states current symptoms are not similar to previous episodes of this condition.  Denies any recent urogenital procedure or catheterization.  Denies additional antibiotic since levofloxacin.  He does have a history of diabetes and was on an SGLT2 inhibitor but discontinued this several months ago with first UTI.    Past Medical History:  Diagnosis Date   CAD (coronary artery disease)    Diabetes mellitus, type II ([place]) 02/2013   Diabetic ulcer of right foot due to type 2 diabetes mellitus ([place]) 03/21/2018   DVT (deep venous thrombosis) ([place]) 05/04/2012   LLE   Exertional dyspnea 05/04/2012   "isolated episode" (05/05/2012)   Hepatic steatosis    History of chickenpox    Hypertension 03/21/2018   Obesity, Class III, BMI 40-49.9 (morbid obesity) ([place]) 03/21/2018   OSA on CPAP    Peripheral vascular disease (HCC)    varicose veins   Pulmonary embolism ([place]) 05/04/2012   bilaterally   Pyelonephritis 04/2017   Small bowel obstruction ([place]) 09/06/2015   Varicose veins     Patient Active Problem List   Diagnosis Date Noted   Positive colorectal cancer screening using Cologuard test 08/09/2021    Colon cancer screening 08/09/2021   Acid indigestion 08/09/2021   Antiplatelet or antithrombotic long-term use 08/09/2021   Bloating symptom 08/09/2021   Hematochezia 08/09/2021   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 acute cor pulmonale ([place]) 06/05/2021   Pressure injury of skin 02/25/2021   Chronic anticoagulation 02/19/2021   History of DVT (deep vein thrombosis) 02/19/2021   CAD (coronary artery disease) 01/09/2021   Eczema 12/27/2019   Erectile dysfunction 12/27/2019   SBO (small bowel obstruction) ([place]) 08/20/2018   History of diabetic ulcer of foot 03/21/2018   Atypical chest pain 02/09/2018   Renal stone 05/12/2017   S/P repair of recurrent ventral hernia 04/26/2017   History of pulmonary embolism 07/16/2016   Chronic venous insufficiency 01/25/2015   Incarcerated ventral hernia 12/13/2013   Diabetes mellitus with hyperglycemia ([place]) 03/31/2013   Leukocytosis 03/23/2013   Hyponatremia 03/23/2013   Morbid obesity with body mass index of 40.0-44.9 in adult [facility]) 05/05/2012   Obstructive sleep apnea 05/05/2012   DVT of left distal popliteal vein 05/05/2012    Past Surgical History:  Procedure Laterality Date   BOWEL RESECTION N/A 02/20/2021   Procedure: SMALL BOWEL RESECTION;  Surgeon: Mickeal Skinner, MD;  Location: [facility];  Service: General;  Laterality: N/A;   CORONARY STENT INTERVENTION N/A 01/02/2021   Procedure: CORONARY STENT INTERVENTION;  Surgeon: Jettie Booze, MD;  Location: [facility];  Service: Cardiovascular;  Laterality: N/A;   [place]  Right 05/12/2017   Procedure: CYSTOSCOPY WITH RETROGRADE PYELOGRAM/URETERAL STENT PLACEMENT;  Surgeon: [name], MD;  Location: [facility];  Service: Urology;  Laterality: Right;   CYSTOSCOPY/URETEROSCOPY/HOLMIUM LASER/STENT PLACEMENT Right 07/29/2017   Procedure: CYSTOSCOPY RIGHT URETEROSCOPY HOLMIUM LASER STENT EXCHANGE;  Surgeon: [name], MD;  Location: [facility];  Service:  Urology;  Laterality: Right;   HERNIA REPAIR     2015   LAPAROTOMY N/A 12/13/2013   Procedure: EXPLORATORY LAPAROTOMY Repair ventral hernia, without mesh, partial omentectomy;  Surgeon: Gwenyth Ober, MD;  Location: [facility];  Service: General;  Laterality: N/A;   LEFT HEART CATH AND CORONARY ANGIOGRAPHY N/A 01/02/2021   Procedure: LEFT HEART CATH AND CORONARY ANGIOGRAPHY;  Surgeon: Jettie Booze, MD;  Location: [facility];  Service: Cardiovascular;  Laterality: N/A;   LYSIS OF ADHESION  02/20/2021   Procedure: LYSIS OF ADHESION;  Surgeon: Kieth Brightly, Arta Bruce, MD;  Location: [facility];  Service: General;;   VENTRAL HERNIA REPAIR N/A 04/26/2017   Procedure: INCARCERATED HERNIA REPAIR VENTRAL ADULT;  Surgeon: Georganna Skeans, MD;  Location: [facility];  Service: General;  Laterality: N/A;   VENTRAL HERNIA REPAIR N/A 02/20/2021   Procedure: EXPLORATORY LAPAROTOMY; RECURRENT HERNIA REPAIR WITH OVERLAY MESH;  Surgeon: Kinsinger, Arta Bruce, MD;  Location: [facility];  Service: General;  Laterality: N/A;       Home Medications    Prior to Admission medications   Medication Sig Start Date End Date Taking? Authorizing Provider  ACCU-CHEK GUIDE test strip USE AS DIRECTED UP TO 4 TIMES DAILY Patient taking differently: 1 each by Other route 4 (four) times daily. 12/30/19  Yes [name], MD  acetaminophen (TYLENOL) 500 MG tablet Take 500-1,000 mg by mouth every 6 (six) hours as needed for mild pain or headache.   Yes [provider]  atorvastatin (LIPITOR) 80 MG tablet TAKE 1 TABLET BY MOUTH EVERY DAY 07/16/21  Yes [name], MD  blood glucose meter kit and supplies KIT Dispense based on patient and insurance preference. Use up to four times daily as directed. E11.65 Patient taking differently: Inject 1 each into the skin See admin instructions. Dispense based on patient and insurance preference. Use up to four times daily as directed. E11.65 12/09/19  Yes [name], MD   clopidogrel (PLAVIX) 75 MG tablet TAKE 1 TABLET BY MOUTH EVERY DAY WITH BREAKFAST 09/17/21  Yes [name], MD  ELIQUIS 5 MG TABS tablet TAKE 1 TABLET BY MOUTH TWICE A DAY 10/29/21  Yes [name], MD  metFORMIN (GLUCOPHAGE-XR) 500 MG 24 hr tablet TAKE 2 TABLETS (1,000 MG TOTAL) BY MOUTH IN THE MORNING AND AT BEDTIME. 11/16/21  Yes [name], MD  metoprolol succinate (TOPROL-XL) 50 MG 24 hr tablet Take 1 tablet (50 mg total) by mouth daily. 01/09/21  Yes [name], MD  Multiple Vitamins-Minerals (CENTRUM SILVER 50+MEN) TABS Take 1 tablet by mouth daily with breakfast.   Yes [provider]  nitroGLYCERIN (NITROSTAT) 0.4 MG SL tablet Place 1 tablet (0.4 mg total) under the tongue every 5 (five) minutes as needed for chest pain. 01/03/21  Yes Elgergawy, Silver [place], MD  pantoprazole (PROTONIX) 40 MG tablet TAKE 1 TABLET BY MOUTH EVERY DAY 07/16/21  Yes [name], MD  PRESCRIPTION MEDICATION Inhale 1 Device into the lungs See admin instructions. CPAP- At bedtime every night   Yes [provider]  Semaglutide,0.25 or 0.5MG/DOS, (OZEMPIC, 0.25 OR 0.5 MG/DOSE,) 2 MG/1.5ML SOPN Inject  0.25 mg as directed once a week for 28 days, THEN 0.5 mg once a week for 28 days. 11/23/21 01/18/22 Yes [name], MD  sulfamethoxazole-trimethoprim (BACTRIM DS) 800-160 MG tablet Take 1 tablet by mouth 2 (two) times daily for 7 days. 12/17/21 12/24/21 Yes Tierre Netto, Derry Skill, PA-C    Family History Family History  Problem Relation Age of Onset   Diabetes Mother        died age 60- also bleeding ulcer   Hypertension Mother    Heart failure Father        in his 66s (patient was 94). day before surgery planned   Other Maternal Grandmother        states natural causes all grandparents   Colon cancer Neg Hx    Esophageal cancer Neg Hx    Inflammatory bowel disease Neg Hx    Liver disease Neg Hx    Pancreatic cancer Neg Hx    Rectal cancer Neg Hx    Stomach cancer Neg Hx      Social History Social History   Tobacco Use   Smoking status: Never   Smokeless tobacco: Never  Vaping Use   Vaping Use: Never used  Substance Use Topics   Alcohol use: No    Alcohol/week: 0.0 standard drinks of alcohol   Drug use: No     Allergies   Patient has no known allergies.   Review of Systems Review of Systems  Constitutional:  Positive for activity change. Negative for appetite change, fatigue and fever.  Gastrointestinal:  Negative for abdominal pain, diarrhea, nausea and vomiting.  Genitourinary:  Positive for dysuria, frequency and urgency. Negative for hematuria, penile discharge, penile pain and penile swelling.     Physical Exam Triage Vital Signs ED Triage Vitals [12/17/21 1733]  Enc Vitals Group     BP 140/73     Pulse Rate 90     Resp 18     Temp 98.2 F (36.8 C)     Temp Source Oral     SpO2 99 %     Weight (!) 350 lb (158.8 kg)     Height 6' 2.5" (1.892 m)     Head Circumference      Peak Flow      Pain Score 0     Pain Loc      Pain Edu?      Excl. in [place]?    No data found.  Updated Vital Signs BP 140/73 (BP Location: Left Arm)   Pulse 90   Temp 98.2 F (36.8 C) (Oral)   Resp 18   Ht 6' 2.5" (1.892 m)   Wt (!) 350 lb (158.8 kg)   SpO2 99%   BMI 44.34 kg/m   Visual Acuity Right Eye Distance:   Left Eye Distance:   Bilateral Distance:    Right Eye Near:   Left Eye Near:    Bilateral Near:     Physical Exam Vitals reviewed.  Constitutional:      General: He is awake.     Appearance: Normal appearance. He is well-developed. He is not ill-appearing.     Comments: Very pleasant male appears stated age in no acute distress sitting comfortably in exam room  HENT:     Head: Normocephalic and atraumatic.     Mouth/Throat:     Pharynx: No oropharyngeal exudate, posterior oropharyngeal erythema or uvula swelling.  Cardiovascular:     Rate and Rhythm: Normal rate and regular rhythm.  Heart sounds: Normal heart sounds,  S1 normal and S2 normal. No murmur heard. Pulmonary:     Effort: Pulmonary effort is normal.     Breath sounds: Normal breath sounds. No stridor. No wheezing, rhonchi or rales.     Comments: Clear to auscultation bilaterally Abdominal:     General: Bowel sounds are normal.     Palpations: Abdomen is soft.     Tenderness: There is no abdominal tenderness. There is no right CVA tenderness, left CVA tenderness, guarding or rebound.     Comments: Benign abdominal exam  Neurological:     Mental Status: He is alert.  Psychiatric:        Behavior: Behavior is cooperative.      UC Treatments / Results  Labs (all labs ordered are listed, but only abnormal results are displayed) Labs Reviewed  POCT URINALYSIS DIPSTICK, ED / UC - Abnormal; Notable for the following components:      Result Value   Ketones, ur TRACE (*)    Hgb urine dipstick TRACE (*)    Leukocytes,Ua MODERATE (*)    All other components within normal limits  URINE CULTURE    EKG   Radiology No results found.  Procedures Procedures (including critical care time)  Medications Ordered in UC Medications - No data to display  Initial Impression / Assessment and Plan / UC Course  I have reviewed the triage vital signs and the nursing notes.  Pertinent labs & imaging results that were available during my care of the patient were reviewed by me and considered in my medical decision making (see chart for details).     UA showed hemoglobin and leukocyte esterase.  Concern for UTI given clinical presentation and abnormal urine.  We will treat with Bactrim DS twice daily for 7 days.  Discussed that if he has any oral lesions or rash he needs to stop the medication.  He is to push fluids.  Urine culture was obtained and we discussed potential need to change antibiotics based on susceptibilities identified on culture.  Discussed that we typically do not place referrals as we do not have anyone to manage this or provide  additional documentation.  I will place a referral to urology given recurrent symptoms but he should also follow-up with his primary care in case an additional referral is needed.  He was given contact information for [facility] with instruction to call to schedule appointment.  Discussed that if he has any persistent or worsening symptoms he needs to be seen immediately.  Strict return precautions given to which he expressed understanding.  Final Clinical Impressions(s) / UC Diagnoses   Final diagnoses:  Lower urinary tract infectious disease  Dysuria  Recurrent UTI     Discharge Instructions      I am concerned since you continue to have urinary tract infections.  I have placed a referral to urology but I also recommend following with your primary care to see if they can place referral since we do not have someone who can follow this or provide additional documentation.  Call urology to see if they will allow you to schedule an appointment for further evaluation.  Start Bactrim DS twice daily.  If you develop any rash or oral lesions you need to stop the medication to be seen immediately.  Make sure you drink plenty of fluid.  If any does change your antibiotics we will contact you.  If you have any worsening symptoms including fever, nausea, vomiting, back  pain, flank pain, blood in your urine, weakness you need to be seen immediately.     ED Prescriptions     Medication Sig Dispense Auth. Provider   sulfamethoxazole-trimethoprim (BACTRIM DS) 800-160 MG tablet Take 1 tablet by mouth 2 (two) times daily for 7 days. 14 tablet Mykah Shin, Derry Skill, PA-C      PDMP not reviewed this encounter.   Terrilee Croak, PA-C 12/17/21 1833

## 2021-12-17 NOTE — ED Triage Notes (Signed)
Patient c/o dysuria, cloudy urine, urgency and odorus urine x 3 days.  Patient denies any OTC meds.

## 2021-12-19 ENCOUNTER — Telehealth (HOSPITAL_COMMUNITY): Payer: Self-pay | Admitting: Emergency Medicine

## 2021-12-19 LAB — URINE CULTURE: Culture: >=100000 — AB

## 2021-12-19 MED ORDER — NITROFURANTOIN MONOHYD MACRO 100 MG PO CAPS: 100.0000 mg | ORAL_CAPSULE | Freq: Two times a day (BID) | ORAL | 0 refills | Status: AC

## 2021-12-27 ENCOUNTER — Other Ambulatory Visit: Payer: Self-pay | Admitting: Family Medicine

## 2022-01-18 ENCOUNTER — Other Ambulatory Visit: Payer: Self-pay | Admitting: Family Medicine

## 2022-01-23 ENCOUNTER — Telehealth: Payer: Self-pay | Admitting: Family Medicine

## 2022-01-23 MED ORDER — NITROGLYCERIN 0.4 MG SL SUBL: 0.4000 mg | SUBLINGUAL_TABLET | SUBLINGUAL | 1 refills | Status: DC | PRN

## 2022-01-23 NOTE — Telephone Encounter (Signed)
Patient states: - He experienced a heart attack in August of 2022 and was prescribed nitroGLYCERIN (NITROSTAT) 0.4 MG SL tablet on 01/03/21.  - He hasn't taken the pills but wanted to know if he should keep a non-expired supply just in case  Patient requests: -Recommendations from PCP on whether this medication is still needed or not  - If so he would like a refill sent to [id number] [place] [id number] Phone:  [phone] Fax:  [phone] DEA #:  [id number]

## 2022-01-23 NOTE — Telephone Encounter (Signed)
Left message on voicemail Rx for Nitro sent to pharmacy so you have updated medication in case you need. Any questions please call office.

## 2022-01-30 ENCOUNTER — Other Ambulatory Visit: Payer: Self-pay | Admitting: Family Medicine

## 2022-02-18 ENCOUNTER — Ambulatory Visit: Payer: PRIVATE HEALTH INSURANCE | Attending: Cardiovascular Disease | Admitting: Cardiovascular Disease

## 2022-02-18 ENCOUNTER — Encounter: Payer: Self-pay | Admitting: *Deleted

## 2022-02-18 ENCOUNTER — Encounter: Payer: Self-pay | Admitting: Cardiovascular Disease

## 2022-02-18 VITALS — BP 130/74 | HR 91 | Ht 74.5 in | Wt 350.6 lb

## 2022-02-18 DIAGNOSIS — I2510 Atherosclerotic heart disease of native coronary artery without angina pectoris: Secondary | ICD-10-CM | POA: Diagnosis not present

## 2022-02-18 DIAGNOSIS — I10 Essential (primary) hypertension: Secondary | ICD-10-CM | POA: Diagnosis not present

## 2022-02-18 DIAGNOSIS — E7800 Pure hypercholesterolemia, unspecified: Secondary | ICD-10-CM | POA: Diagnosis not present

## 2022-02-18 MED ORDER — ASPIRIN 81 MG PO TBEC: 81.0000 mg | DELAYED_RELEASE_TABLET | Freq: Every day | ORAL | 3 refills | Status: AC

## 2022-02-18 NOTE — Progress Notes (Signed)
Chief Complaint  Patient presents with   Follow-up    CAD   History of Present Illness: 61 yo male with history of  CAD, diabetes mellitus, HTN, hyperlipidemia, morbid obesity, history of DVT and PE in 2013, sleep apnea who is here today for cardiac follow up. He was admitted to [facility] 12/31/20 with a NSTEMI. Cardiac cath 01/02/21 with a severe mid RCA stenosis treated with a drug eluting stent. Also with septal branch occlusion. Echo 01/01/21 with LVEF=55-60%. No valve disease.   He is here today for follow up. The patient denies any chest pain, dyspnea, palpitations, lower extremity edema, orthopnea, PND, dizziness, near syncope or syncope.   Primary Care Physician: [name], MD  Past Medical History:  Diagnosis Date   CAD (coronary artery disease)    Diabetes mellitus, type II ([place]) 02/2013   Diabetic ulcer of right foot due to type 2 diabetes mellitus ([place]) 03/21/2018   DVT (deep venous thrombosis) ([place]) 05/04/2012   LLE   Exertional dyspnea 05/04/2012   "isolated episode" (05/05/2012)   Hepatic steatosis    History of chickenpox    Hypertension 03/21/2018   Obesity, Class III, BMI 40-49.9 (morbid obesity) ([place]) 03/21/2018   OSA on CPAP    Peripheral vascular disease (HCC)    varicose veins   Pulmonary embolism ([place]) 05/04/2012   bilaterally   Pyelonephritis 04/2017   Small bowel obstruction ([place]) 09/06/2015   Varicose veins     Past Surgical History:  Procedure Laterality Date   BOWEL RESECTION N/A 02/20/2021   Procedure: SMALL BOWEL RESECTION;  Surgeon: Mickeal Skinner, MD;  Location: [place];  Service: General;  Laterality: N/A;   CORONARY STENT INTERVENTION N/A 01/02/2021   Procedure: CORONARY STENT INTERVENTION;  Surgeon: Jettie Booze, MD;  Location: [place] [facility];  Service: Cardiovascular;  Laterality: N/A;   CYSTOSCOPY W/ URETERAL STENT PLACEMENT Right 05/12/2017   Procedure: CYSTOSCOPY WITH RETROGRADE PYELOGRAM/URETERAL STENT PLACEMENT;   Surgeon: [name], MD;  Location: [place];  Service: Urology;  Laterality: Right;   CYSTOSCOPY/URETEROSCOPY/HOLMIUM LASER/STENT PLACEMENT Right 07/29/2017   Procedure: CYSTOSCOPY RIGHT URETEROSCOPY HOLMIUM LASER STENT EXCHANGE;  Surgeon: [name], MD;  Location: [facility];  Service: Urology;  Laterality: Right;   HERNIA REPAIR     2015   LAPAROTOMY N/A 12/13/2013   Procedure: EXPLORATORY LAPAROTOMY Repair ventral hernia, without mesh, partial omentectomy;  Surgeon: Gwenyth Ober, MD;  Location: [place];  Service: General;  Laterality: N/A;   LEFT HEART CATH AND CORONARY ANGIOGRAPHY N/A 01/02/2021   Procedure: LEFT HEART CATH AND CORONARY ANGIOGRAPHY;  Surgeon: Jettie Booze, MD;  Location: [place] [facility];  Service: Cardiovascular;  Laterality: N/A;   LYSIS OF ADHESION  02/20/2021   Procedure: LYSIS OF ADHESION;  Surgeon: Kieth Brightly, Arta Bruce, MD;  Location: [place];  Service: General;;   VENTRAL HERNIA REPAIR N/A 04/26/2017   Procedure: INCARCERATED HERNIA REPAIR VENTRAL ADULT;  Surgeon: Georganna Skeans, MD;  Location: [place];  Service: General;  Laterality: N/A;   VENTRAL HERNIA REPAIR N/A 02/20/2021   Procedure: EXPLORATORY LAPAROTOMY; RECURRENT HERNIA REPAIR WITH OVERLAY MESH;  Surgeon: Kinsinger, Arta Bruce, MD;  Location: [place];  Service: General;  Laterality: N/A;    Current Outpatient Medications  Medication Sig Dispense Refill   ACCU-CHEK GUIDE test strip USE AS DIRECTED UP TO 4 TIMES DAILY (Patient taking differently: 1 each by Other route 4 (four) times daily.) 100 strip 3   acetaminophen (TYLENOL) 500 MG  tablet Take 500-1,000 mg by mouth every 6 (six) hours as needed for mild pain or headache.     aspirin EC 81 MG tablet Take 1 tablet (81 mg total) by mouth daily. Swallow whole. 90 tablet 3   atorvastatin (LIPITOR) 80 MG tablet TAKE 1 TABLET BY MOUTH EVERY DAY 30 tablet 5   blood glucose meter kit and supplies KIT Dispense based on patient and insurance preference. Use up to four  times daily as directed. E11.65 (Patient taking differently: Inject 1 each into the skin See admin instructions. Dispense based on patient and insurance preference. Use up to four times daily as directed. E11.65) 1 each 0   ELIQUIS 5 MG TABS tablet TAKE 1 TABLET BY MOUTH TWICE A DAY 60 tablet 5   metFORMIN (GLUCOPHAGE-XR) 500 MG 24 hr tablet TAKE 2 TABLETS (1,000 MG TOTAL) BY MOUTH IN THE MORNING AND AT BEDTIME. 360 tablet 1   metoprolol succinate (TOPROL-XL) 50 MG 24 hr tablet TAKE 1 TABLET BY MOUTH EVERY DAY 90 tablet 3   Multiple Vitamins-Minerals (CENTRUM SILVER 50+MEN) TABS Take 1 tablet by mouth daily with breakfast.     nitroGLYCERIN (NITROSTAT) 0.4 MG SL tablet Place 1 tablet (0.4 mg total) under the tongue every 5 (five) minutes as needed for chest pain. 25 tablet 1   OZEMPIC, 0.25 OR 0.5 MG/DOSE, 2 MG/3ML SOPN Inject 0.25 mg into the skin once a week.     pantoprazole (PROTONIX) 40 MG tablet TAKE 1 TABLET BY MOUTH EVERY DAY 90 tablet 1   PRESCRIPTION MEDICATION Inhale 1 Device into the lungs See admin instructions. CPAP- At bedtime every night     No current facility-administered medications for this visit.    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 Lives alone. Friends that live close. No pets.       Works for [employer]. 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       Hobbies:  golf, basketball, walking   Tired from work right now   Social Determinants of Health   Financial Resource Strain: Not on file  Food Insecurity: Not on file  Transportation Needs: Not on file  Physical Activity: Not on file  Stress: Not on file  Social Connections: Not on file  Intimate Partner Violence: Not on file    Family History  Problem Relation Age of Onset   Diabetes Mother        died age 73- also bleeding ulcer   Hypertension Mother    Heart failure Father        in his 40s (patient was 16). day before surgery planned   Other Maternal Grandmother        states natural causes all grandparents   Colon cancer Neg Hx      Esophageal cancer Neg Hx    Inflammatory bowel disease Neg Hx    Liver disease Neg Hx    Pancreatic cancer Neg Hx    Rectal cancer Neg Hx    Stomach cancer Neg Hx     Review of Systems:  As stated in the HPI and otherwise negative.   BP 130/74   Pulse 91   Ht 6' 2.5" (1.892 m)   Wt (!) 350 lb 9.6 oz (159 kg)   SpO2 97%   BMI 44.41 kg/m   Physical Examination: General: Well developed, well nourished, NAD  HEENT: OP clear, mucus membranes moist  SKIN: warm, dry. No rashes. Neuro: No focal deficits  Musculoskeletal: Muscle strength 5/5 all ext  Psychiatric: Mood and affect normal  Neck: No JVD, no carotid bruits, no thyromegaly, no lymphadenopathy.  Lungs:Clear bilaterally, no wheezes, rhonci, crackles Cardiovascular: Regular rate and rhythm. No murmurs, gallops or rubs. Abdomen:Soft. Bowel sounds present. Non-tender.  Extremities: No lower extremity edema. Pulses are 2 + in the bilateral DP/PT.  EKG:  EKG is ordered today. The ekg ordered today demonstrates NSR  Echo 01/01/21:  1. Left ventricular ejection fraction, by estimation, is 55 to 60%. The  left ventricle has normal function. The left ventricle has no regional  wall motion abnormalities. Left ventricular diastolic parameters are  consistent with Grade I diastolic  dysfunction  (impaired relaxation).   2. Right ventricular systolic function is normal. The right ventricular  size is normal.   3. The mitral valve is normal in structure. No evidence of mitral valve  regurgitation. No evidence of mitral stenosis.   4. The aortic valve is tricuspid. Aortic valve regurgitation is not  visualized. No aortic stenosis is present.   5. The inferior vena cava is normal in size with greater than 50%  respiratory variability, suggesting right atrial pressure of 3 mmHg.   Cardiac cath 01/02/21:   2nd Sept lesion is 100% stenosed.  Right to left collaterals.   Mid RCA lesion is 95% stenosed.  A drug-eluting stent was successfully placed using a STENT ONYX FRONTIER 4.0X26, postdilated to 4.5 mm.   A drug-eluting stent was successfully placed using a STENT ONYX FRONTIER 4.0X26.   Post intervention, there is a 0% residual stenosis.   The left ventricular systolic function is normal.   LV end diastolic pressure is normal.   The left ventricular ejection fraction is 55-65% by visual estimate.   There is no aortic valve stenosis.  Recent Labs: 02/24/2021: Magnesium 1.7 11/23/2021: ALT 21; BUN 17; Creat 1.20; Hemoglobin 12.5; Platelets 209; Potassium 4.3; Sodium 142; TSH 1.28   Lipid Panel    Component Value Date/Time   CHOL 122 03/29/2021 0812   TRIG 123 03/29/2021 0812   HDL 31 (L) 03/29/2021 0812   CHOLHDL 3.9 03/29/2021 0812   CHOLHDL 6.9 01/02/2021 0411   VLDL 39 01/02/2021 0411   LDLCALC 69 03/29/2021 0812   LDLCALC 131 (H) 12/27/2019 1457   LDLDIRECT 78.0 03/22/2021 1342     Wt Readings from Last 3 Encounters:  02/18/22 (!) 350 lb 9.6 oz (159 kg)  12/17/21 (!) 350 lb (158.8 kg)  11/23/21 (!) 346 lb (156.9 kg)     Assessment and Plan:   1. CAD without angina: NSTEMI in August 2022 secondary to severe mid RCA stenosis. This was treated with a drug eluting stent. LV function normal by echo. He has no chest pain. Will stop Plavix and start ASA. He is also on Eliquis      2. HTN: BP is controlled. No changes  3. Hyperlipidemia: LDL at goal in November 2022. Continue statin  4. 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   Signed, Lauree Chandler, MD 02/18/2022 3:43 PM    [facility] [address], [place], [place]  [address] Phone: [phone]; Fax: [phone]

## 2022-02-18 NOTE — Patient Instructions (Signed)
Medication Instructions:  Your physician has recommended you make the following change in your medication:  1.) stop clopidogrel (Plavix) 2.) start aspirin 81 mg - one tablet daily  *If you need a refill on your cardiac medications before your next appointment, please call your pharmacy*   Lab Work: none If you have labs (blood work) drawn today and your tests are completely normal, you will receive your results only by: [place] (if you have MyChart) OR A paper copy in the mail If you have any lab test that is abnormal or we need to change your treatment, we will call you to review the results.   Testing/Procedures: none   Follow-Up: At [facility], you and your health needs are our priority.  As part of our continuing mission to provide you with exceptional heart care, we have created designated Provider Care Teams.  These Care Teams include your primary Cardiologist (physician) and Advanced Practice Providers (APPs -  Physician Assistants and Nurse Practitioners) who all work together to provide you with the care you need, when you need it.   Your next appointment:   12 month(s)  The format for your next appointment:   In Person  Provider:   Lauree Chandler, MD     Important Information About Sugar

## 2022-03-25 ENCOUNTER — Encounter: Payer: Self-pay | Admitting: Family Medicine

## 2022-03-25 ENCOUNTER — Ambulatory Visit: Payer: PRIVATE HEALTH INSURANCE | Admitting: Family Medicine

## 2022-03-25 VITALS — BP 128/80 | HR 80 | Temp 97.8°F | Ht 74.5 in | Wt 350.4 lb

## 2022-03-25 DIAGNOSIS — E1169 Type 2 diabetes mellitus with other specified complication: Secondary | ICD-10-CM

## 2022-03-25 DIAGNOSIS — Z23 Encounter for immunization: Secondary | ICD-10-CM | POA: Diagnosis not present

## 2022-03-25 DIAGNOSIS — I2510 Atherosclerotic heart disease of native coronary artery without angina pectoris: Secondary | ICD-10-CM

## 2022-03-25 DIAGNOSIS — I82432 Acute embolism and thrombosis of left popliteal vein: Secondary | ICD-10-CM | POA: Diagnosis not present

## 2022-03-25 DIAGNOSIS — E785 Hyperlipidemia, unspecified: Secondary | ICD-10-CM

## 2022-03-25 DIAGNOSIS — E1165 Type 2 diabetes mellitus with hyperglycemia: Secondary | ICD-10-CM

## 2022-03-25 MED ORDER — PANTOPRAZOLE SODIUM 40 MG PO TBEC
40.0000 mg | DELAYED_RELEASE_TABLET | Freq: Every day | ORAL | 3 refills | Status: DC
Start: 2022-03-25 — End: 2023-05-26

## 2022-03-25 NOTE — Progress Notes (Signed)
Phone [phone] In person visit   Subjective:   Evan Leonard is a 61 y.o. year old very pleasant male patient who presents for/with See problem oriented charting Chief Complaint  Patient presents with   Follow-up   Diabetes    Bood sugars are fluctuating he is trying to walk and watching eating habits.   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 acute cor pulmonale ([place]) 06/05/2021    Priority: High   CAD (coronary artery disease) 01/09/2021    Priority: High   Atypical chest pain 02/09/2018    Priority: High   History of pulmonary embolism 07/16/2016    Priority: High   Diabetes mellitus with hyperglycemia ([place]) 03/31/2013    Priority: High   Morbid obesity with body mass index of 40.0-44.9 in adult [facility]) 05/05/2012    Priority: High   DVT of left distal popliteal vein 05/05/2012    Priority: High   Acid indigestion 08/09/2021    Priority: Medium    History of small bowel obstruction 08/20/2018    Priority: Medium    History of diabetic ulcer of foot 03/21/2018    Priority: Medium    S/P repair of recurrent ventral hernia 04/26/2017    Priority: Medium    Chronic venous insufficiency 01/25/2015    Priority: Medium    Obstructive sleep apnea 05/05/2012    Priority: Medium    Eczema 12/27/2019    Priority: Low   Erectile dysfunction 12/27/2019    Priority: Low   Renal stone 05/12/2017    Priority: Low   Hyponatremia 03/23/2013    Priority: Low   Positive colorectal cancer screening using Cologuard test 08/09/2021   Hematochezia 08/09/2021   Pressure injury of skin 02/25/2021   Chronic anticoagulation 02/19/2021   History of DVT (deep vein thrombosis) 02/19/2021   Incarcerated ventral hernia 12/13/2013   Leukocytosis 03/23/2013    Medications- reviewed and updated Current Outpatient Medications  Medication Sig Dispense Refill   ACCU-CHEK GUIDE test strip USE AS DIRECTED UP  TO 4 TIMES DAILY (Patient taking differently: 1 each by Other route 4 (four) times daily.) 100 strip 3   acetaminophen (TYLENOL) 500 MG tablet Take 500-1,000 mg by mouth every 6 (six) hours as needed for mild pain or headache.     aspirin EC 81 MG tablet Take 1 tablet (81 mg total) by mouth daily. Swallow whole. 90 tablet 3   atorvastatin (LIPITOR) 80 MG tablet TAKE 1 TABLET BY MOUTH EVERY DAY 30 tablet 5   blood glucose meter kit and supplies KIT Dispense based on patient and insurance preference. Use up to four times daily as directed. E11.65 (Patient taking differently: Inject 1 each into the skin See admin instructions. Dispense based on patient and insurance preference. Use up to four times daily as directed. E11.65) 1 each 0   ELIQUIS 5 MG TABS tablet TAKE 1 TABLET BY MOUTH TWICE A DAY 60 tablet 5   metFORMIN (GLUCOPHAGE-XR) 500 MG 24 hr tablet TAKE 2 TABLETS (1,000 MG TOTAL) BY MOUTH IN THE MORNING AND AT BEDTIME. 360 tablet 1   metoprolol succinate (TOPROL-XL) 50 MG 24 hr tablet TAKE 1 TABLET BY MOUTH EVERY DAY 90 tablet 3   Multiple Vitamins-Minerals (CENTRUM SILVER 50+MEN) TABS Take 1 tablet by mouth daily with breakfast.     nitroGLYCERIN (NITROSTAT) 0.4 MG SL tablet Place 1 tablet (0.4 mg total) under the tongue every 5 (five) minutes  as needed for chest pain. 25 tablet 1   OZEMPIC, 0.25 OR 0.5 MG/DOSE, 2 MG/3ML SOPN Inject 0.25 mg into the skin once a week.     PRESCRIPTION MEDICATION Inhale 1 Device into the lungs See admin instructions. CPAP- At bedtime every night     pantoprazole (PROTONIX) 40 MG tablet Take 1 tablet (40 mg total) by mouth daily. 90 tablet 3   No current facility-administered medications for this visit.     Objective:  BP 128/80   Pulse 80   Temp 97.8 F (36.6 C)   Ht 6' 2.5" (1.892 m)   Wt (!) 350 lb 6.4 oz (158.9 kg)   SpO2 95%   BMI 44.39 kg/m  Gen: NAD, resting comfortably CV: RRR no murmurs rubs or gallops Lungs: CTAB no crackles, wheeze,  rhonchi Ext: no edema Skin: warm, dry Neuro: grossly normal, moves all extremities   Diabetic Foot Exam - Simple   Simple Foot Form Diabetic Foot exam was performed with the following findings: Yes 03/25/2022  1:52 PM  Visual Inspection No deformities, no ulcerations, no other skin breakdown bilaterally: Yes Sensation Testing Intact to touch and monofilament testing bilaterally: Yes Pulse Check Posterior Tibialis and Dorsalis pulse intact bilaterally: Yes Comments Onychomycosis         Assessment and Plan   #CAD-NSTEMI in August 2022 secondary to severe mid RCA stenosis. This was treated with a drug eluting stent. LV function normal by echo. No chest pain. Will continue Plavix until August 2023 at which time we will stop his Plavix and start ASA.  #hyperlipidemia S: Medication:Atorvastatin 80 mg, asa 81 mg- Nitroglycerin on hand- not needing- also takes eliquis for PE history  -Also on metoprolol 50 mg extended release -no chest pain during day- occasionally has some sleep position dependent pain that resolves with getting up and moving. If working hard at work some mild shortness of breath Lab Results  Component Value Date   CHOL 122 03/29/2021   HDL 31 (L) 03/29/2021   LDLCALC 69 03/29/2021   LDLDIRECT 78.0 03/22/2021   TRIG 123 03/29/2021   CHOLHDL 3.9 03/29/2021  A/P: CAD stable- likely some weight/deconditioning related shortness of breath- if worsens will let us know- for now continue current meds. Lipids at goal and will be due for repeat next visit  #Chronic pulmonary embolism/DVT S: Medication: Patient mains on Eliquis 5 mg twice daily due to history of recurrent DVT. No worsening SOB- see CAD section A/P: Controlled. Continue current medications.    # Diabetes S: Medication:metformin 1000 mg twice daily, ozempic currently- just started about a week ago-Significant nausea issues in the past on Ozempic- more mild this time with one dose - Have discussed concerned  about UTIs and continuing Jardiance-  CBGs- fluctuations- doesn't feel has been as disciplined- up to 160 this morning. No lows. Usually AMs 105. Up 4 lbs from last visit but wants to reverse trend.  Lab Results  Component Value Date   HGBA1C 7.8 (H) 11/23/2021   HGBA1C 7.7 (H) 06/05/2021   HGBA1C 9.5 (H) 01/01/2021   A/P:  concerned may have worsened- update a1c today- for now continue current meds.  -if above 8.3 or so consider Repaglinide add on  # GERD S:Medication: Pantoprazole 40 mg but ran out and has noted some reflux- was told had hiatal hernia A/P: slightly worsened off meds- requests refill- refill today- if doing well consider 20 mg dose in future   #OSA-compliant with CPAP   #History of bowel  obstruction-hospitalized September through October 2022 with high-grade small bowel obstruction related to large and complex multilocular ventral hernia    #Onychomycosis bilateral feet-did have cellulitis in 2019 and we considered treatment-opted to hold off unless recurrence of cellulitis   Recommended follow up: Return in about 4 months (around 07/25/2022) for followup or sooner if needed.Schedule b4 you leave. No future appointments.  Lab/Order associations:   ICD-10-CM   1. Type 2 diabetes mellitus with hyperglycemia, without long-term current use of insulin (HCC)  E11.65     2. Coronary artery disease involving native coronary artery of native heart without angina pectoris  I25.10     3. Deep vein thrombosis (DVT) of popliteal vein of left lower extremity, unspecified chronicity (HCC)  I82.432     4. Need for immunization against influenza  Z23 Flu Vaccine QUAD 4moIM (Fluarix, Fluzone & Alfiuria Quad PF)      Meds ordered this encounter  Medications   pantoprazole (PROTONIX) 40 MG tablet    Sig: Take 1 tablet (40 mg total) by mouth daily.    Dispense:  90 tablet    Refill:  3    Return precautions advised.  SGarret Reddish MD

## 2022-03-25 NOTE — Patient Instructions (Addendum)
Please stop by lab before you go If you have mychart- we will send your results within 3 business days of [place] receiving them.  If you do not have mychart- we will call you about results within 5 business days of [place] receiving them.  *please also note that you will see labs on mychart as soon as they post. I will later go in and write notes on them- will say "notes from Dr. Yong Channel"   Consider adding additional medicine if a1c more than few tics over 8 - otherwise mainly push ozempic as able  Recommended follow up: Return in about 4 months (around 07/25/2022) for followup or sooner if needed.Schedule b4 you leave.

## 2022-03-26 LAB — COMPREHENSIVE METABOLIC PANEL
ALT: 32 U/L (ref 0–53)
AST: 30 U/L (ref 0–37)
Albumin: 3.8 g/dL (ref 3.5–5.2)
Alkaline Phosphatase: 66 U/L (ref 39–117)
BUN: 14 mg/dL (ref 6–23)
CO2: 27 mEq/L (ref 19–32)
Calcium: 9.2 mg/dL (ref 8.4–10.5)
Chloride: 103 mEq/L (ref 96–112)
Creatinine, Ser: 0.88 mg/dL (ref 0.40–1.50)
GFR: 91.91 mL/min (ref >60.00)
Glucose, Bld: 134 mg/dL — ABNORMAL HIGH (ref 70–99)
Potassium: 4.1 mEq/L (ref 3.5–5.1)
Sodium: 139 mEq/L (ref 135–145)
Total Bilirubin: 0.9 mg/dL (ref 0.2–1.2)
Total Protein: 7.1 g/dL (ref 6.0–8.3)

## 2022-03-26 LAB — CBC WITH DIFFERENTIAL/PLATELET
Basophils Absolute: 0.1 10*3/uL (ref 0.0–0.1)
Basophils Relative: 1.2 % (ref 0.0–3.0)
Eosinophils Absolute: 0.4 10*3/uL (ref 0.0–0.7)
Eosinophils Relative: 4.4 % (ref 0.0–5.0)
HCT: 39.3 % (ref 39.0–52.0)
Hemoglobin: 12.6 g/dL — ABNORMAL LOW (ref 13.0–17.0)
Lymphocytes Relative: 29.1 % (ref 12.0–46.0)
Lymphs Abs: 2.4 10*3/uL (ref 0.7–4.0)
MCHC: 32.1 g/dL (ref 30.0–36.0)
MCV: 87 fl (ref 78.0–100.0)
Monocytes Absolute: 0.6 10*3/uL (ref 0.1–1.0)
Monocytes Relative: 7.5 % (ref 3.0–12.0)
Neutro Abs: 4.8 10*3/uL (ref 1.4–7.7)
Neutrophils Relative %: 57.8 % (ref 43.0–77.0)
Platelets: 172 10*3/uL (ref 150.0–400.0)
RBC: 4.52 Mil/uL (ref 4.22–5.81)
RDW: 16.5 % — ABNORMAL HIGH (ref 11.5–15.5)
WBC: 8.3 10*3/uL (ref 4.0–10.5)

## 2022-03-26 LAB — HEMOGLOBIN A1C: Hgb A1c MFr Bld: 10 % — ABNORMAL HIGH (ref 4.6–6.5)

## 2022-03-26 LAB — LDL CHOLESTEROL, DIRECT: Direct LDL: 67 mg/dL

## 2022-04-27 ENCOUNTER — Other Ambulatory Visit: Payer: Self-pay | Admitting: Family Medicine

## 2022-05-23 ENCOUNTER — Other Ambulatory Visit: Payer: Self-pay | Admitting: Family Medicine

## 2022-06-26 ENCOUNTER — Other Ambulatory Visit: Payer: Self-pay | Admitting: Family Medicine

## 2022-07-29 ENCOUNTER — Ambulatory Visit: Payer: PRIVATE HEALTH INSURANCE | Admitting: Family Medicine

## 2022-07-29 ENCOUNTER — Encounter: Payer: Self-pay | Admitting: Family Medicine

## 2022-07-29 VITALS — BP 124/78 | HR 89 | Temp 97.3°F | Ht 73.25 in | Wt 341.0 lb

## 2022-07-29 DIAGNOSIS — E1169 Type 2 diabetes mellitus with other specified complication: Secondary | ICD-10-CM | POA: Diagnosis not present

## 2022-07-29 DIAGNOSIS — E1165 Type 2 diabetes mellitus with hyperglycemia: Secondary | ICD-10-CM | POA: Diagnosis not present

## 2022-07-29 DIAGNOSIS — E785 Hyperlipidemia, unspecified: Secondary | ICD-10-CM | POA: Diagnosis not present

## 2022-07-29 DIAGNOSIS — Z6841 Body Mass Index (BMI) 40.0 and over, adult: Secondary | ICD-10-CM

## 2022-07-29 DIAGNOSIS — I2782 Chronic pulmonary embolism: Secondary | ICD-10-CM | POA: Diagnosis not present

## 2022-07-29 LAB — COMPREHENSIVE METABOLIC PANEL
ALT: 28 U/L (ref 0–53)
AST: 27 U/L (ref 0–37)
Albumin: 3.7 g/dL (ref 3.5–5.2)
Alkaline Phosphatase: 76 U/L (ref 39–117)
BUN: 14 mg/dL (ref 6–23)
CO2: 29 mEq/L (ref 19–32)
Calcium: 9.7 mg/dL (ref 8.4–10.5)
Chloride: 103 mEq/L (ref 96–112)
Creatinine, Ser: 0.94 mg/dL (ref 0.40–1.50)
GFR: 86.44 mL/min (ref >60.00)
Glucose, Bld: 148 mg/dL — ABNORMAL HIGH (ref 70–99)
Potassium: 4.8 mEq/L (ref 3.5–5.1)
Sodium: 141 mEq/L (ref 135–145)
Total Bilirubin: 0.9 mg/dL (ref 0.2–1.2)
Total Protein: 7.2 g/dL (ref 6.0–8.3)

## 2022-07-29 LAB — CBC WITH DIFFERENTIAL/PLATELET
Basophils Absolute: 0 10*3/uL (ref 0.0–0.1)
Basophils Relative: 0.5 % (ref 0.0–3.0)
Eosinophils Absolute: 0.4 10*3/uL (ref 0.0–0.7)
Eosinophils Relative: 4 % (ref 0.0–5.0)
HCT: 40.5 % (ref 39.0–52.0)
Hemoglobin: 13.2 g/dL (ref 13.0–17.0)
Lymphocytes Relative: 26.3 % (ref 12.0–46.0)
Lymphs Abs: 2.5 10*3/uL (ref 0.7–4.0)
MCHC: 32.6 g/dL (ref 30.0–36.0)
MCV: 86.1 fl (ref 78.0–100.0)
Monocytes Absolute: 0.7 10*3/uL (ref 0.1–1.0)
Monocytes Relative: 7 % (ref 3.0–12.0)
Neutro Abs: 6 10*3/uL (ref 1.4–7.7)
Neutrophils Relative %: 62.2 % (ref 43.0–77.0)
Platelets: 224 10*3/uL (ref 150.0–400.0)
RBC: 4.7 Mil/uL (ref 4.22–5.81)
RDW: 16.3 % — ABNORMAL HIGH (ref 11.5–15.5)
WBC: 9.6 10*3/uL (ref 4.0–10.5)

## 2022-07-29 LAB — LIPID PANEL
Cholesterol: 117 mg/dL (ref 0–200)
HDL: 34.7 mg/dL — ABNORMAL LOW (ref >39.00)
LDL Cholesterol: 53 mg/dL (ref 0–99)
NonHDL: 82.17
Total CHOL/HDL Ratio: 3
Triglycerides: 148 mg/dL (ref 0.0–149.0)
VLDL: 29.6 mg/dL (ref 0.0–40.0)

## 2022-07-29 LAB — HEMOGLOBIN A1C: Hgb A1c MFr Bld: 10.7 % — ABNORMAL HIGH (ref 4.6–6.5)

## 2022-07-29 MED ORDER — OZEMPIC (0.25 OR 0.5 MG/DOSE) 2 MG/3ML ~~LOC~~ SOPN: PEN_INJECTOR | SUBCUTANEOUS | 0 refills | Status: DC

## 2022-07-29 NOTE — Patient Instructions (Addendum)
suspect poorly controlled- update a1c but go ahead and restart ozempic at 0.25 mg and titrate to 0.5 mg after 4 weeks of the lower dose- I want him to update me in 2.5 months with how he is doing- we could go up to 1 mg or higher.   Please stop by lab before you go If you have mychart- we will send your results within 3 business days of [place] receiving them.  If you do not have mychart- we will call you about results within 5 business days of [place] receiving them.  *please also note that you will see labs on mychart as soon as they post. I will later go in and write notes on them- will say "notes from Dr. Yong Channel"    Recommended follow up: Return in about 4 months (around 11/28/2022) for followup or sooner if needed.Schedule b4 you leave.

## 2022-07-29 NOTE — Progress Notes (Signed)
Phone [phone] In person visit   Subjective:   Evan Leonard is a 62 y.o. year old very pleasant male patient who presents for/with See problem oriented charting Chief Complaint  Patient presents with   Follow-up   Diabetes    Pt states sugars have been running to high this morning it was 187 and the highest being 225 fasting.   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 acute cor pulmonale ([place]) 06/05/2021    Priority: High   CAD (coronary artery disease) 01/09/2021    Priority: High   Atypical chest pain 02/09/2018    Priority: High   History of pulmonary embolism 07/16/2016    Priority: High   Diabetes mellitus with hyperglycemia ([place]) 03/31/2013    Priority: High   Morbid obesity with body mass index of 40.0-44.9 in adult [facility]) 05/05/2012    Priority: High   DVT of left distal popliteal vein 05/05/2012    Priority: High   Hyperlipidemia associated with type 2 diabetes mellitus ([place]) 03/25/2022    Priority: Medium    Acid indigestion 08/09/2021    Priority: Medium    History of small bowel obstruction 08/20/2018    Priority: Medium    History of diabetic ulcer of foot 03/21/2018    Priority: Medium    S/P repair of recurrent ventral hernia 04/26/2017    Priority: Medium    Chronic venous insufficiency 01/25/2015    Priority: Medium    Obstructive sleep apnea 05/05/2012    Priority: Medium    Eczema 12/27/2019    Priority: Low   Erectile dysfunction 12/27/2019    Priority: Low   Renal stone 05/12/2017    Priority: Low   Hyponatremia 03/23/2013    Priority: Low   Positive colorectal cancer screening using Cologuard test 08/09/2021   Hematochezia 08/09/2021   Pressure injury of skin 02/25/2021   Chronic anticoagulation 02/19/2021   Incarcerated ventral hernia 12/13/2013   Leukocytosis 03/23/2013    Medications- reviewed and updated Current Outpatient Medications   Medication Sig Dispense Refill   ACCU-CHEK GUIDE test strip USE AS DIRECTED UP TO 4 TIMES DAILY (Patient taking differently: 1 each by Other route 4 (four) times daily.) 100 strip 3   acetaminophen (TYLENOL) 500 MG tablet Take 500-1,000 mg by mouth every 6 (six) hours as needed for mild pain or headache.     aspirin EC 81 MG tablet Take 1 tablet (81 mg total) by mouth daily. Swallow whole. 90 tablet 3   atorvastatin (LIPITOR) 80 MG tablet TAKE 1 TABLET BY MOUTH EVERY DAY 90 tablet 1   blood glucose meter kit and supplies KIT Dispense based on patient and insurance preference. Use up to four times daily as directed. E11.65 (Patient taking differently: Inject 1 each into the skin See admin instructions. Dispense based on patient and insurance preference. Use up to four times daily as directed. E11.65) 1 each 0   ELIQUIS 5 MG TABS tablet TAKE 1 TABLET BY MOUTH TWICE A DAY 60 tablet 5   metFORMIN (GLUCOPHAGE-XR) 500 MG 24 hr tablet TAKE 2 TABLETS (1,000 MG TOTAL) BY MOUTH IN THE MORNING AND AT BEDTIME. 360 tablet 1   metoprolol succinate (TOPROL-XL) 50 MG 24 hr tablet TAKE 1 TABLET BY MOUTH EVERY DAY 90 tablet 3   Multiple Vitamins-Minerals (CENTRUM SILVER 50+MEN) TABS Take 1 tablet by mouth daily with breakfast.     nitroGLYCERIN (NITROSTAT) 0.4 MG SL  tablet Place 1 tablet (0.4 mg total) under the tongue every 5 (five) minutes as needed for chest pain. 25 tablet 1   pantoprazole (PROTONIX) 40 MG tablet Take 1 tablet (40 mg total) by mouth daily. 90 tablet 3   PRESCRIPTION MEDICATION Inhale 1 Device into the lungs See admin instructions. CPAP- At bedtime every night     Semaglutide,0.25 or 0.'5MG'$ /DOS, (OZEMPIC, 0.25 OR 0.5 MG/DOSE,) 2 MG/3ML SOPN Inject 0.25 mg into the skin once a week for 28 days, THEN 0.5 mg once a week. 9 mL 0   No current facility-administered medications for this visit.     Objective:  BP 124/78   Pulse 89   Temp (!) 97.3 F (36.3 C)   Ht 6' 1.25" (1.861 m)   Wt (!) 341 lb  (154.7 kg)   SpO2 95%   BMI 44.68 kg/m  Gen: NAD, resting comfortably CV: RRR no murmurs rubs or gallops Lungs: CTAB no crackles, wheeze, rhonchi Abdomen: soft/nontender except for some pain over abdomen where he bumped into a counter/nondistended/normal bowel sounds. No rebound or guarding.  Ext: no edema Skin: warm, dry     Assessment and Plan   #CAD-NSTEMI in August 2022 secondary to severe mid RCA stenosis. This was treated with a drug eluting stent. LV function normal by echo. No chest pain. Will continue Plavix until August 2023 at which time we will stop his Plavix and start ASA. He is also on Eliquis.  #hyperlipidemia S: Medication:Atorvastatin 80 mg ,  asa 81 mg- Nitroglycerin on hand  -Also on metoprolol 50 mg extended release  -no chest pain or shortness of breath (prior issues improved post surgery) A/P: CAD asymptomatic continue current medications Lipids hopefully at goal- update lipid panel- continue current medications   #Chronic pulmonary embolism/DVT S: Medication: Patient mains on Eliquis 5 mg twice daily due to history of recurrent DVT A/P: stable- continue current medicines     # Diabetes/morbid obesity S: Medication:, metformin 1000 mg twice daily, ozempic retrial 02/2022 but only had one month of supply- was tolerating nausea 2nd time -Significant nausea issues in the past on Ozempic - UTIs with Jardiance unfortunately CBGs-  187 this AM but as high as 225 in recent weeks Exercise and diet- some weight loss- we have some concern could be related to diabetes- he has tried to improve his diet Lab Results  Component Value Date   HGBA1C 10.0 (H) 03/25/2022   HGBA1C 7.8 (H) 11/23/2021   HGBA1C 7.7 (H) 06/05/2021  A/P: suspect poorly controlled- update a1c but go ahead and restart ozempic at 0.25 mg and titrate to 0.5 mg after 4 weeks of the lower dose- I want him to update me in 2.5 months with how he is doing- we could go up to 1 mg or higher.  -continue  metformin -weight loss but hoping not related to poorly controlled diabetes  - can't tolerate jardiance -morbid obesity- keep working on weight loss- thankful for 9 lbs weight loss  # GERD and has been told hiatal hernia S:Medication: Pantoprazole 40 mg -some morning nausea but improves after medicine A/P: reasonably controlled - continue current medications   #bumped into counter recently- some abdominal pain but improving- wants to monitor for now no external bruising  Recommended follow up: Return in about 4 months (around 11/28/2022) for followup or sooner if needed.Schedule b4 you leave.  Lab/Order associations:   ICD-10-CM   1. Hyperlipidemia associated with type 2 diabetes mellitus (HCC)  E11.69 CBC with Differential/Platelet  E78.5 Comprehensive metabolic panel    Lipid panel    Hemoglobin A1c    2. Type 2 diabetes mellitus with hyperglycemia, without long-term current use of insulin (HCC)  E11.65     3. 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 acute cor pulmonale (HCC) Chronic I27.82     4. Morbid obesity with body mass index of 40.0-44.9 in adult [facility]) Chronic E66.01    Z68.41       Meds ordered this encounter  Medications   Semaglutide,0.25 or 0.'5MG'$ /DOS, (OZEMPIC, 0.25 OR 0.5 MG/DOSE,) 2 MG/3ML SOPN    Sig: Inject 0.25 mg into the skin once a week for 28 days, THEN 0.5 mg once a week.    Dispense:  9 mL    Refill:  0   Return precautions advised.  Garret Reddish, MD

## 2022-08-27 ENCOUNTER — Other Ambulatory Visit: Payer: Self-pay

## 2022-08-27 ENCOUNTER — Encounter (HOSPITAL_COMMUNITY): Payer: Self-pay | Admitting: *Deleted

## 2022-08-27 ENCOUNTER — Ambulatory Visit (HOSPITAL_COMMUNITY)
Admission: EM | Admit: 2022-08-27 | Discharge: 2022-08-27 | Disposition: A | Discharge status: Home / Self Care | Payer: PRIVATE HEALTH INSURANCE | Attending: Internal Medicine | Admitting: Internal Medicine

## 2022-08-27 DIAGNOSIS — R42 Dizziness and giddiness: Secondary | ICD-10-CM

## 2022-08-27 LAB — CBC WITH DIFFERENTIAL/PLATELET
Abs Immature Granulocytes: 0.04 10*3/uL (ref 0.00–0.07)
Basophils Absolute: 0.1 10*3/uL (ref 0.0–0.1)
Basophils Relative: 1 %
Eosinophils Absolute: 0.3 10*3/uL (ref 0.0–0.5)
Eosinophils Relative: 3 %
HCT: 40.3 % (ref 39.0–52.0)
Hemoglobin: 13.1 g/dL (ref 13.0–17.0)
Immature Granulocytes: 1 %
Lymphocytes Relative: 27 %
Lymphs Abs: 2.4 10*3/uL (ref 0.7–4.0)
MCH: 28.1 pg (ref 26.0–34.0)
MCHC: 32.5 g/dL (ref 30.0–36.0)
MCV: 86.3 fL (ref 80.0–100.0)
Monocytes Absolute: 0.6 10*3/uL (ref 0.1–1.0)
Monocytes Relative: 7 %
Neutro Abs: 5.4 10*3/uL (ref 1.7–7.7)
Neutrophils Relative %: 61 %
Platelets: 181 10*3/uL (ref 150–400)
RBC: 4.67 MIL/uL (ref 4.22–5.81)
RDW: 15.9 % — ABNORMAL HIGH (ref 11.5–15.5)
WBC: 8.8 10*3/uL (ref 4.0–10.5)
nRBC: 0 % (ref 0.0–0.2)

## 2022-08-27 LAB — COMPREHENSIVE METABOLIC PANEL
ALT: 31 U/L (ref 0–44)
AST: 29 U/L (ref 15–41)
Albumin: 3.3 g/dL — ABNORMAL LOW (ref 3.5–5.0)
Alkaline Phosphatase: 64 U/L (ref 38–126)
Anion gap: 13 (ref 5–15)
BUN: 21 mg/dL (ref 8–23)
CO2: 24 mmol/L (ref 22–32)
Calcium: 9.5 mg/dL (ref 8.9–10.3)
Chloride: 102 mmol/L (ref 98–111)
Creatinine, Ser: 1.23 mg/dL (ref 0.61–1.24)
GFR, Estimated: >60 mL/min (ref >60)
Glucose, Bld: 271 mg/dL — ABNORMAL HIGH (ref 70–99)
Potassium: 4.7 mmol/L (ref 3.5–5.1)
Sodium: 139 mmol/L (ref 135–145)
Total Bilirubin: 0.8 mg/dL (ref 0.3–1.2)
Total Protein: 7.2 g/dL (ref 6.5–8.1)

## 2022-08-27 LAB — CBG MONITORING, ED: Glucose-Capillary: 272 mg/dL — ABNORMAL HIGH (ref 70–99)

## 2022-08-27 NOTE — ED Triage Notes (Signed)
PT reports this Am his CBG was 296. Pt took his DM meds and CBG dropped to 271 . Pt still felt dizzy so he came to [facility].

## 2022-08-27 NOTE — Discharge Instructions (Addendum)
Dizziness may be secondary to dehydration or may be a side effect of Ozempic Please maintain adequate hydration Will call you with recommendations if labs are abnormal Please check your blood sugars daily If you have worsening symptoms please return to urgent care to be reevaluated.

## 2022-08-28 NOTE — ED Provider Notes (Addendum)
[place]    CSN: [id number] Arrival date & time: 08/27/22  [id number]      History   Chief Complaint Chief Complaint  Patient presents with   Hyperglycemia   Dizziness    HPI Evan Leonard is a 62 y.o. male with a history of diabetes mellitus type 2 currently managed on metformin and Ozempic comes to urgent care with complaints of dizziness and lightheadedness of 1 day duration.  Patient's blood sugar was 296 this morning.  Patient says this is unusual for him.  He took his Ozempic and rechecked at the blood sugars and it was down to 276.  He denies any difficulty with his speech, facial deviation, numbness on the face, double vision or extremity numbness or weakness.  Patient restarted Ozempic about a few weeks ago and is currently on 0.25 mg injection weekly.  He has some polyuria and is drinking a lot of water.  He denies any abdominal pain, nausea vomiting or diarrhea.  No fever or chills.  No chest pain or chest pressure.  No sick contacts.  No sore throat, runny nose, shortness of breath or cough.Marland Kitchen   HPI  Past Medical History:  Diagnosis Date   CAD (coronary artery disease)    Diabetes mellitus, type II 02/2013   Diabetic ulcer of right foot due to type 2 diabetes mellitus 03/21/2018   DVT (deep venous thrombosis) 05/04/2012   LLE   Exertional dyspnea 05/04/2012   "isolated episode" (05/05/2012)   Hepatic steatosis    History of chickenpox    Hypertension 03/21/2018   Obesity, Class III, BMI 40-49.9 (morbid obesity) 03/21/2018   OSA on CPAP    Peripheral vascular disease    varicose veins   Pulmonary embolism 05/04/2012   bilaterally   Pyelonephritis 04/2017   Small bowel obstruction 09/06/2015   Varicose veins     Patient Active Problem List   Diagnosis Date Noted   Hyperlipidemia associated with type 2 diabetes mellitus 03/25/2022   Positive colorectal cancer screening using Cologuard test 08/09/2021   Acid indigestion 08/09/2021   Hematochezia  08/09/2021   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 acute cor pulmonale 06/05/2021   Pressure injury of skin 02/25/2021   Chronic anticoagulation 02/19/2021   CAD (coronary artery disease) 01/09/2021   Eczema 12/27/2019   Erectile dysfunction 12/27/2019   History of small bowel obstruction 08/20/2018   History of diabetic ulcer of foot 03/21/2018   Atypical chest pain 02/09/2018   Renal stone 05/12/2017   S/P repair of recurrent ventral hernia 04/26/2017   History of pulmonary embolism 07/16/2016   Chronic venous insufficiency 01/25/2015   Incarcerated ventral hernia 12/13/2013   Diabetes mellitus with hyperglycemia 03/31/2013   Leukocytosis 03/23/2013   Hyponatremia 03/23/2013   Morbid obesity with body mass index of 40.0-44.9 in adult 05/05/2012   Obstructive sleep apnea 05/05/2012   DVT of left distal popliteal vein 05/05/2012    Past Surgical History:  Procedure Laterality Date   BOWEL RESECTION N/A 02/20/2021   Procedure: SMALL BOWEL RESECTION;  Surgeon: Kieth Brightly, Arta Bruce, MD;  Location: [place];  Service: General;  Laterality: N/A;   CORONARY STENT INTERVENTION N/A 01/02/2021   Procedure: CORONARY STENT INTERVENTION;  Surgeon: Jettie Booze, MD;  Location: [place] [facility];  Service: Cardiovascular;  Laterality: N/A;   CYSTOSCOPY W/ URETERAL STENT PLACEMENT Right 05/12/2017   Procedure: CYSTOSCOPY WITH RETROGRADE PYELOGRAM/URETERAL STENT PLACEMENT;  Surgeon: [name], MD;  Location: [place];  Service: Urology;  Laterality:  Right;   CYSTOSCOPY/URETEROSCOPY/HOLMIUM LASER/STENT PLACEMENT Right 07/29/2017   Procedure: CYSTOSCOPY RIGHT URETEROSCOPY HOLMIUM LASER STENT EXCHANGE;  Surgeon: [name], MD;  Location: [facility];  Service: Urology;  Laterality: Right;   HERNIA REPAIR     2015   LAPAROTOMY N/A 12/13/2013   Procedure: EXPLORATORY LAPAROTOMY Repair ventral hernia, without mesh, partial omentectomy;  Surgeon: Gwenyth Ober, MD;  Location: [place];  Service: General;   Laterality: N/A;   LEFT HEART CATH AND CORONARY ANGIOGRAPHY N/A 01/02/2021   Procedure: LEFT HEART CATH AND CORONARY ANGIOGRAPHY;  Surgeon: Jettie Booze, MD;  Location: [place] [facility];  Service: Cardiovascular;  Laterality: N/A;   LYSIS OF ADHESION  02/20/2021   Procedure: LYSIS OF ADHESION;  Surgeon: Kieth Brightly, Arta Bruce, MD;  Location: [place];  Service: General;;   VENTRAL HERNIA REPAIR N/A 04/26/2017   Procedure: INCARCERATED HERNIA REPAIR VENTRAL ADULT;  Surgeon: Georganna Skeans, MD;  Location: [place];  Service: General;  Laterality: N/A;   VENTRAL HERNIA REPAIR N/A 02/20/2021   Procedure: EXPLORATORY LAPAROTOMY; RECURRENT HERNIA REPAIR WITH OVERLAY MESH;  Surgeon: Kinsinger, Arta Bruce, MD;  Location: [place];  Service: General;  Laterality: N/A;       Home Medications    Prior to Admission medications   Medication Sig Start Date End Date Taking? Authorizing Provider  ACCU-CHEK GUIDE test strip USE AS DIRECTED UP TO 4 TIMES DAILY Patient taking differently: 1 each by Other route 4 (four) times daily. 12/30/19   [name], MD  acetaminophen (TYLENOL) 500 MG tablet Take 500-1,000 mg by mouth every 6 (six) hours as needed for mild pain or headache.    [provider]  aspirin EC 81 MG tablet Take 1 tablet (81 mg total) by mouth daily. Swallow whole. 02/18/22   Burnell Blanks, MD  atorvastatin (LIPITOR) 80 MG tablet TAKE 1 TABLET BY MOUTH EVERY DAY 06/26/22   [name], MD  blood glucose meter kit and supplies KIT Dispense based on patient and insurance preference. Use up to four times daily as directed. E11.65 Patient taking differently: Inject 1 each into the skin See admin instructions. Dispense based on patient and insurance preference. Use up to four times daily as directed. E11.65 12/09/19   [name], MD  ELIQUIS 5 MG TABS tablet TAKE 1 TABLET BY MOUTH TWICE A DAY 04/29/22   [name], MD  metFORMIN (GLUCOPHAGE-XR) 500 MG 24 hr tablet  TAKE 2 TABLETS (1,000 MG TOTAL) BY MOUTH IN THE MORNING AND AT BEDTIME. 05/23/22   [name], MD  metoprolol succinate (TOPROL-XL) 50 MG 24 hr tablet TAKE 1 TABLET BY MOUTH EVERY DAY 12/27/21   [name], MD  Multiple Vitamins-Minerals (CENTRUM SILVER 50+MEN) TABS Take 1 tablet by mouth daily with breakfast.    [provider]  nitroGLYCERIN (NITROSTAT) 0.4 MG SL tablet Place 1 tablet (0.4 mg total) under the tongue every 5 (five) minutes as needed for chest pain. 01/23/22   [name], MD  pantoprazole (PROTONIX) 40 MG tablet Take 1 tablet (40 mg total) by mouth daily. 03/25/22   [name], MD  PRESCRIPTION MEDICATION Inhale 1 Device into the lungs See admin instructions. CPAP- At bedtime every night    [provider]  Semaglutide,0.25 or 0.5MG /DOS, (OZEMPIC, 0.25 OR 0.5 MG/DOSE,) 2 MG/3ML SOPN Inject 0.25 mg into the skin once a week for 28 days, THEN 0.5 mg once a week. 07/29/22 11/04/22  [name],  MD    Family History Family History  Problem Relation Age of Onset   Diabetes Mother        died age 3- also bleeding ulcer   Hypertension Mother    Heart failure Father        in his 23s (patient was 39). day before surgery planned   Other Maternal Grandmother        states natural causes all grandparents   Colon cancer Neg Hx    Esophageal cancer Neg Hx    Inflammatory bowel disease Neg Hx    Liver disease Neg Hx    Pancreatic cancer Neg Hx    Rectal cancer Neg Hx    Stomach cancer Neg Hx     Social History Social History   Tobacco Use   Smoking status: Never   Smokeless tobacco: Never  Vaping Use   Vaping Use: Never used  Substance Use Topics   Alcohol use: No    Alcohol/week: 0.0 standard drinks of alcohol   Drug use: No     Allergies   Patient has no known allergies.   Review of Systems Review of Systems As per HPI  Physical Exam Triage Vital Signs ED Triage Vitals  Enc Vitals Group     BP 08/27/22 0927  130/82     Pulse Rate 08/27/22 0927 94     Resp 08/27/22 0927 20     Temp --      Temp src --      SpO2 08/27/22 0927 96 %     Weight --      Height --      Head Circumference --      Peak Flow --      Pain Score 08/27/22 0929 0     Pain Loc --      Pain Edu? --      Excl. in [place]? --    No data found.  Updated Vital Signs BP 130/82   Pulse 94   Resp 20   SpO2 96%   Visual Acuity Right Eye Distance:   Left Eye Distance:   Bilateral Distance:    Right Eye Near:   Left Eye Near:    Bilateral Near:     Physical Exam Vitals and nursing note reviewed.  Constitutional:      General: He is not in acute distress.    Appearance: He is not ill-appearing.  HENT:     Right Ear: Tympanic membrane normal.     Left Ear: Tympanic membrane normal.  Cardiovascular:     Rate and Rhythm: Normal rate and regular rhythm.     Pulses: Normal pulses.     Heart sounds: Normal heart sounds.  Abdominal:     General: Bowel sounds are normal.     Palpations: Abdomen is soft.  Skin:    General: Skin is warm.     Capillary Refill: Capillary refill takes less than 2 seconds.  Neurological:     General: No focal deficit present.     Mental Status: He is alert.      UC Treatments / Results  Labs (all labs ordered are listed, but only abnormal results are displayed) Labs Reviewed  CBC WITH DIFFERENTIAL/PLATELET - Abnormal; Notable for the following components:      Result Value   RDW 15.9 (*)    All other components within normal limits  COMPREHENSIVE METABOLIC PANEL - Abnormal; Notable for the following components:   Glucose, Bld 271 (*)  Albumin 3.3 (*)    All other components within normal limits  CBG MONITORING, ED - Abnormal; Notable for the following components:   Glucose-Capillary 272 (*)    All other components within normal limits    EKG   Radiology No results found.  Procedures Procedures (including critical care time)  Medications Ordered in UC Medications -  No data to display  Initial Impression / Assessment and Plan / UC Course  I have reviewed the triage vital signs and the nursing notes.  Pertinent labs & imaging results that were available during my care of the patient were reviewed by me and considered in my medical decision making (see chart for details).     1.  Dizziness and lightheadedness: Likely side effect of Ozempic CBC and CMP Patient is advised to maintain adequate oral fluid intake Continue blood sugar monitoring Will call patient with recommendations if labs are abnormal Patient is neuro exam is nonfocal Return precautions given.  2.  Diabetes mellitus type 2: Last hemoglobin A1c was 10.5 Patient is advised to check blood sugars daily Patient is advised to continue escalating the doses of Ozempic as recommended by primary care physician Diabetic diet reemphasized Return precautions given. Final Clinical Impressions(s) / UC Diagnoses   Final diagnoses:  Dizziness, nonspecific     Discharge Instructions      Dizziness may be secondary to dehydration or may be a side effect of Ozempic Please maintain adequate hydration Will call you with recommendations if labs are abnormal Please check your blood sugars daily If you have worsening symptoms please return to urgent care to be reevaluated.   ED Prescriptions   None    PDMP not reviewed this encounter.   Chase Picket, MD 08/28/22 1309    Chase Picket, MD 08/28/22 1310

## 2022-09-17 ENCOUNTER — Other Ambulatory Visit: Payer: Self-pay | Admitting: Family Medicine

## 2022-09-28 ENCOUNTER — Other Ambulatory Visit: Payer: Self-pay | Admitting: Family Medicine

## 2022-10-15 ENCOUNTER — Other Ambulatory Visit: Payer: Self-pay

## 2022-10-15 ENCOUNTER — Ambulatory Visit (HOSPITAL_COMMUNITY)
Admission: EM | Admit: 2022-10-15 | Discharge: 2022-10-15 | Disposition: A | Discharge status: Home / Self Care | Payer: PRIVATE HEALTH INSURANCE | Attending: Family Medicine | Admitting: Family Medicine

## 2022-10-15 ENCOUNTER — Encounter (HOSPITAL_COMMUNITY): Payer: Self-pay | Admitting: *Deleted

## 2022-10-15 DIAGNOSIS — N390 Urinary tract infection, site not specified: Secondary | ICD-10-CM | POA: Insufficient documentation

## 2022-10-15 LAB — POCT URINALYSIS DIP (MANUAL ENTRY)
Bilirubin, UA: NEGATIVE
Glucose, UA: >=1000 mg/dL — AB
Ketones, POC UA: NEGATIVE mg/dL
Leukocytes, UA: NEGATIVE
Nitrite, UA: POSITIVE — AB
Protein Ur, POC: NEGATIVE mg/dL
Spec Grav, UA: 1.02 (ref 1.010–1.025)
Urobilinogen, UA: 0.2 E.U./dL
pH, UA: 5.5 (ref 5.0–8.0)

## 2022-10-15 MED ORDER — LEVOFLOXACIN 500 MG PO TABS: 500.0000 mg | ORAL_TABLET | Freq: Every day | ORAL | 0 refills | Status: AC

## 2022-10-15 NOTE — Discharge Instructions (Signed)
The urinalysis showed a little bit of blood and some nitrites.  This could be signs of a urinary tract infection  Levaquin 500 mg--take 1 tablet daily for 7 days  Urine culture is sent, and staff will call you if it looks like the antibiotic needs to be changed

## 2022-10-15 NOTE — ED Triage Notes (Signed)
Pt reports having cloudy colored urin . Strong odor to urine and back pain for one week.

## 2022-10-15 NOTE — ED Provider Notes (Signed)
[facility]    CSN: [id number] Arrival date & time: 10/15/22  1530      History   Chief Complaint Chief Complaint  Patient presents with   urine odor   urine cloudy   Back Pain    HPI Evan Leonard is a 62 y.o. male.    Back Pain  Here for cloudy urine with some low back pain and a little bit of dysuria.  Symptoms began about a week ago.  No fever or chills and no nausea or vomiting.  He has had a little bit of decrease in his appetite due to his Ozempic.  His sugars have been doing better.  He does have a history of several UTIs previously, the last 1 being in July 2023.  He is on Eliquis  No URI or congestion symptoms  Past Medical History:  Diagnosis Date   CAD (coronary artery disease)    Diabetes mellitus, type II (HCC) 02/2013   Diabetic ulcer of right foot due to type 2 diabetes mellitus (HCC) 03/21/2018   DVT (deep venous thrombosis) (HCC) 05/04/2012   LLE   Exertional dyspnea 05/04/2012   "isolated episode" (05/05/2012)   Hepatic steatosis    History of chickenpox    Hypertension 03/21/2018   Obesity, Class III, BMI 40-49.9 (morbid obesity) (HCC) 03/21/2018   OSA on CPAP    Peripheral vascular disease (HCC)    varicose veins   Pulmonary embolism (HCC) 05/04/2012   bilaterally   Pyelonephritis 04/2017   Small bowel obstruction (HCC) 09/06/2015   Varicose veins     Patient Active Problem List   Diagnosis Date Noted   Hyperlipidemia associated with type 2 diabetes mellitus (HCC) 03/25/2022   Positive colorectal cancer screening using Cologuard test 08/09/2021   Acid indigestion 08/09/2021   Hematochezia 08/09/2021   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 acute cor pulmonale (HCC) 06/05/2021   Pressure injury of skin 02/25/2021   Chronic anticoagulation 02/19/2021   CAD (coronary artery disease) 01/09/2021   Eczema 12/27/2019   Erectile dysfunction 12/27/2019   History of small bowel obstruction 08/20/2018   History of diabetic  ulcer of foot 03/21/2018   Atypical chest pain 02/09/2018   Renal stone 05/12/2017   S/P repair of recurrent ventral hernia 04/26/2017   History of pulmonary embolism 07/16/2016   Chronic venous insufficiency 01/25/2015   Incarcerated ventral hernia 12/13/2013   Diabetes mellitus with hyperglycemia (HCC) 03/31/2013   Leukocytosis 03/23/2013   Hyponatremia 03/23/2013   Morbid obesity with body mass index of 40.0-44.9 in adult [facility]) 05/05/2012   Obstructive sleep apnea 05/05/2012   DVT of left distal popliteal vein 05/05/2012    Past Surgical History:  Procedure Laterality Date   BOWEL RESECTION N/A 02/20/2021   Procedure: SMALL BOWEL RESECTION;  Surgeon: Rodman Pickle, MD;  Location: [facility] OR;  Service: General;  Laterality: N/A;   CORONARY STENT INTERVENTION N/A 01/02/2021   Procedure: CORONARY STENT INTERVENTION;  Surgeon: Corky Crafts, MD;  Location: [facility];  Service: Cardiovascular;  Laterality: N/A;   CYSTOSCOPY W/ URETERAL STENT PLACEMENT Right 05/12/2017   Procedure: CYSTOSCOPY WITH RETROGRADE PYELOGRAM/URETERAL STENT PLACEMENT;  Surgeon: Bjorn Pippin, MD;  Location: [facility] OR;  Service: Urology;  Laterality: Right;   CYSTOSCOPY/URETEROSCOPY/HOLMIUM LASER/STENT PLACEMENT Right 07/29/2017   Procedure: CYSTOSCOPY RIGHT URETEROSCOPY HOLMIUM LASER STENT EXCHANGE;  Surgeon: Bjorn Pippin, MD;  Location: [facility] ORS;  Service: Urology;  Laterality: Right;   HERNIA REPAIR     2015   LAPAROTOMY  N/A 12/13/2013   Procedure: EXPLORATORY LAPAROTOMY Repair ventral hernia, without mesh, partial omentectomy;  Surgeon: Cherylynn Ridges, MD;  Location: [facility] OR;  Service: General;  Laterality: N/A;   LEFT HEART CATH AND CORONARY ANGIOGRAPHY N/A 01/02/2021   Procedure: LEFT HEART CATH AND CORONARY ANGIOGRAPHY;  Surgeon: Corky Crafts, MD;  Location: [facility] INVASIVE CV LAB;  Service: Cardiovascular;  Laterality: N/A;   LYSIS OF ADHESION  02/20/2021   Procedure: LYSIS OF ADHESION;  Surgeon:  Sheliah Hatch, De Blanch, MD;  Location: [facility] OR;  Service: General;;   VENTRAL HERNIA REPAIR N/A 04/26/2017   Procedure: INCARCERATED HERNIA REPAIR VENTRAL ADULT;  Surgeon: Violeta Gelinas, MD;  Location: [facility] OR;  Service: General;  Laterality: N/A;   VENTRAL HERNIA REPAIR N/A 02/20/2021   Procedure: EXPLORATORY LAPAROTOMY; RECURRENT HERNIA REPAIR WITH OVERLAY MESH;  Surgeon: Kinsinger, De Blanch, MD;  Location: [facility] OR;  Service: General;  Laterality: N/A;       Home Medications    Prior to Admission medications   Medication Sig Start Date End Date Taking? Authorizing Provider  levofloxacin (LEVAQUIN) 500 MG tablet Take 1 tablet (500 mg total) by mouth daily for 7 days. 10/15/22 10/22/22 Yes Teddie Mehta, Janace Aris, MD  ACCU-CHEK GUIDE test strip USE AS DIRECTED UP TO 4 TIMES DAILY Patient taking differently: 1 each by Other route 4 (four) times daily. 12/30/19   Shelva Majestic, MD  acetaminophen (TYLENOL) 500 MG tablet Take 500-1,000 mg by mouth every 6 (six) hours as needed for mild pain or headache.    [provider]  aspirin EC 81 MG tablet Take 1 tablet (81 mg total) by mouth daily. Swallow whole. 02/18/22   Kathleene Hazel, MD  atorvastatin (LIPITOR) 80 MG tablet TAKE 1 TABLET BY MOUTH EVERY DAY 09/30/22   Shelva Majestic, MD  blood glucose meter kit and supplies KIT Dispense based on patient and insurance preference. Use up to four times daily as directed. E11.65 Patient taking differently: Inject 1 each into the skin See admin instructions. Dispense based on patient and insurance preference. Use up to four times daily as directed. E11.65 12/09/19   Shelva Majestic, MD  ELIQUIS 5 MG TABS tablet TAKE 1 TABLET BY MOUTH TWICE A DAY 04/29/22   Shelva Majestic, MD  metFORMIN (GLUCOPHAGE-XR) 500 MG 24 hr tablet TAKE 2 TABLETS (1,000 MG TOTAL) BY MOUTH IN THE MORNING AND AT BEDTIME. 09/18/22   Shelva Majestic, MD  metoprolol succinate (TOPROL-XL) 50 MG 24 hr tablet TAKE 1 TABLET BY  MOUTH EVERY DAY 12/27/21   Shelva Majestic, MD  Multiple Vitamins-Minerals (CENTRUM SILVER 50+MEN) TABS Take 1 tablet by mouth daily with breakfast.    [provider]  nitroGLYCERIN (NITROSTAT) 0.4 MG SL tablet Place 1 tablet (0.4 mg total) under the tongue every 5 (five) minutes as needed for chest pain. 01/23/22   Shelva Majestic, MD  pantoprazole (PROTONIX) 40 MG tablet Take 1 tablet (40 mg total) by mouth daily. 03/25/22   Shelva Majestic, MD  PRESCRIPTION MEDICATION Inhale 1 Device into the lungs See admin instructions. CPAP- At bedtime every night    [provider]  Semaglutide,0.25 or 0.5MG /DOS, (OZEMPIC, 0.25 OR 0.5 MG/DOSE,) 2 MG/3ML SOPN Inject 0.25 mg into the skin once a week for 28 days, THEN 0.5 mg once a week. 07/29/22 11/04/22  Shelva Majestic, MD    Family History Family History  Problem Relation Age of Onset   Diabetes Mother  died age 90- also bleeding ulcer   Hypertension Mother    Heart failure Father        in his 17s (patient was 56). day before surgery planned   Other Maternal Grandmother        states natural causes all grandparents   Colon cancer Neg Hx    Esophageal cancer Neg Hx    Inflammatory bowel disease Neg Hx    Liver disease Neg Hx    Pancreatic cancer Neg Hx    Rectal cancer Neg Hx    Stomach cancer Neg Hx     Social History Social History   Tobacco Use   Smoking status: Never   Smokeless tobacco: Never  Vaping Use   Vaping Use: Never used  Substance Use Topics   Alcohol use: No    Alcohol/week: 0.0 standard drinks of alcohol   Drug use: No     Allergies   Patient has no known allergies.   Review of Systems Review of Systems  Musculoskeletal:  Positive for back pain.     Physical Exam Triage Vital Signs ED Triage Vitals  Enc Vitals Group     BP 10/15/22 1602 135/79     Pulse Rate 10/15/22 1602 85     Resp 10/15/22 1602 18     Temp 10/15/22 1602 98.2 F (36.8 C)     Temp src --      SpO2  10/15/22 1602 94 %     Weight --      Height --      Head Circumference --      Peak Flow --      Pain Score 10/15/22 1600 4     Pain Loc --      Pain Edu? --      Excl. in GC? --    No data found.  Updated Vital Signs BP 135/79   Pulse 85   Temp 98.2 F (36.8 C)   Resp 18   SpO2 94%   Visual Acuity Right Eye Distance:   Left Eye Distance:   Bilateral Distance:    Right Eye Near:   Left Eye Near:    Bilateral Near:     Physical Exam Vitals reviewed.  Constitutional:      General: He is not in acute distress.    Appearance: He is not ill-appearing, toxic-appearing or diaphoretic.  HENT:     Mouth/Throat:     Mouth: Mucous membranes are moist.  Eyes:     Extraocular Movements: Extraocular movements intact.     Conjunctiva/sclera: Conjunctivae normal.     Pupils: Pupils are equal, round, and reactive to light.  Cardiovascular:     Rate and Rhythm: Normal rate and regular rhythm.     Heart sounds: No murmur heard. Pulmonary:     Effort: Pulmonary effort is normal.     Breath sounds: Normal breath sounds.  Abdominal:     General: There is no distension.     Palpations: Abdomen is soft.     Tenderness: There is no abdominal tenderness. There is no right CVA tenderness or left CVA tenderness.  Musculoskeletal:     Cervical back: Neck supple.  Lymphadenopathy:     Cervical: No cervical adenopathy.  Skin:    Coloration: Skin is not pale.  Neurological:     General: No focal deficit present.     Mental Status: He is alert and oriented to person, place, and time.  Psychiatric:  Behavior: Behavior normal.      UC Treatments / Results  Labs (all labs ordered are listed, but only abnormal results are displayed) Labs Reviewed  POCT URINALYSIS DIP (MANUAL ENTRY) - Abnormal; Notable for the following components:      Result Value   Glucose, UA >=1,000 (*)    Blood, UA trace-intact (*)    Nitrite, UA Positive (*)    All other components within normal  limits  URINE CULTURE    EKG   Radiology No results found.  Procedures Procedures (including critical care time)  Medications Ordered in UC Medications - No data to display  Initial Impression / Assessment and Plan / UC Course  I have reviewed the triage vital signs and the nursing notes.  Pertinent labs & imaging results that were available during my care of the patient were reviewed by me and considered in my medical decision making (see chart for details).        UA shows nitrites, but no white cells at present.  There is a trace of blood.  Urine culture is sent.  Levaquin is sent in to treat the UTI.  Will notify him if that needs to be changed.  Final Clinical Impressions(s) / UC Diagnoses   Final diagnoses:  Lower urinary tract infectious disease     Discharge Instructions      The urinalysis showed a little bit of blood and some nitrites.  This could be signs of a urinary tract infection  Levaquin 500 mg--take 1 tablet daily for 7 days  Urine culture is sent, and staff will call you if it looks like the antibiotic needs to be changed      ED Prescriptions     Medication Sig Dispense Auth. Provider   levofloxacin (LEVAQUIN) 500 MG tablet Take 1 tablet (500 mg total) by mouth daily for 7 days. 7 tablet Kahlel Peake, Janace Aris, MD      PDMP not reviewed this encounter.   Zenia Resides, MD 10/15/22 [phone]

## 2022-10-17 LAB — URINE CULTURE: Culture: 70000 — AB

## 2022-10-18 ENCOUNTER — Telehealth (HOSPITAL_COMMUNITY): Payer: Self-pay | Admitting: Emergency Medicine

## 2022-10-18 MED ORDER — CEPHALEXIN 500 MG PO CAPS: 500.0000 mg | ORAL_CAPSULE | Freq: Three times a day (TID) | ORAL | 0 refills | Status: AC

## 2022-10-22 ENCOUNTER — Other Ambulatory Visit: Payer: Self-pay | Admitting: Family Medicine

## 2022-12-09 ENCOUNTER — Ambulatory Visit: Payer: PRIVATE HEALTH INSURANCE | Admitting: Family Medicine

## 2022-12-23 ENCOUNTER — Other Ambulatory Visit: Payer: Self-pay | Admitting: Family Medicine

## 2023-01-06 ENCOUNTER — Encounter: Payer: Self-pay | Admitting: Family Medicine

## 2023-01-06 ENCOUNTER — Ambulatory Visit: Payer: PRIVATE HEALTH INSURANCE | Admitting: Family Medicine

## 2023-01-06 VITALS — BP 122/76 | HR 85 | Temp 98.0°F | Ht 73.25 in | Wt 323.2 lb

## 2023-01-06 DIAGNOSIS — E1165 Type 2 diabetes mellitus with hyperglycemia: Secondary | ICD-10-CM | POA: Diagnosis not present

## 2023-01-06 DIAGNOSIS — I2782 Chronic pulmonary embolism: Secondary | ICD-10-CM | POA: Diagnosis not present

## 2023-01-06 DIAGNOSIS — Z79899 Other long term (current) drug therapy: Secondary | ICD-10-CM

## 2023-01-06 DIAGNOSIS — E785 Hyperlipidemia, unspecified: Secondary | ICD-10-CM

## 2023-01-06 DIAGNOSIS — Z7984 Long term (current) use of oral hypoglycemic drugs: Secondary | ICD-10-CM

## 2023-01-06 DIAGNOSIS — E1169 Type 2 diabetes mellitus with other specified complication: Secondary | ICD-10-CM

## 2023-01-06 DIAGNOSIS — R82998 Other abnormal findings in urine: Secondary | ICD-10-CM

## 2023-01-06 LAB — MICROALBUMIN / CREATININE URINE RATIO
Creatinine,U: 133.6 mg/dL
Microalb Creat Ratio: 0.9 mg/g (ref 0.0–30.0)
Microalb, Ur: 1.2 mg/dL (ref 0.0–1.9)

## 2023-01-06 LAB — POC URINALSYSI DIPSTICK (AUTOMATED)
Bilirubin, UA: NEGATIVE
Blood, UA: NEGATIVE
Glucose, UA: NEGATIVE
Ketones, UA: NEGATIVE
Leukocytes, UA: NEGATIVE
Nitrite, UA: POSITIVE
Protein, UA: NEGATIVE
Spec Grav, UA: 1.02 (ref 1.010–1.025)
Urobilinogen, UA: 1 E.U./dL
pH, UA: 6 (ref 5.0–8.0)

## 2023-01-06 MED ORDER — SILDENAFIL CITRATE 100 MG PO TABS: 100.0000 mg | ORAL_TABLET | Freq: Every day | ORAL | 5 refills | Status: AC | PRN

## 2023-01-06 NOTE — Patient Instructions (Addendum)
Have diabetic eye exam sent to [place] when you have it done [phone].  -asked him to contact me if weight loss stagnates for perhaps 3 weeks then could go to higher dose- just being cautious with prior nausea issues  Let us know if you get your flu shot this fall.  Please stop by lab before you go If you have mychart- we will send your results within 3 business days of [place] receiving them.  If you do not have mychart- we will call you about results within 5 business days of [place] receiving them.  *please also note that you will see labs on mychart as soon as they post. I will later go in and write notes on them- will say "notes from Dr. Durene Cal"   Recommended follow up: Return in about 4 months (around 05/08/2023) for physical or sooner if needed.Schedule b4 you leave.

## 2023-01-06 NOTE — Progress Notes (Signed)
Phone [phone] In person visit   Subjective:   Evan Leonard is a 62 y.o. year old very pleasant male patient who presents for/with See problem oriented charting Chief Complaint  Patient presents with   Medical Management of Chronic Issues   Hyperlipidemia   Diabetes    DEE scheduled for this month   Hypothyroidism   dark urine    Pt c/o dark urine with odor    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 acute cor pulmonale (HCC) 06/05/2021    Priority: High   CAD (coronary artery disease) 01/09/2021    Priority: High   Atypical chest pain 02/09/2018    Priority: High   History of pulmonary embolism 07/16/2016    Priority: High   Diabetes mellitus with hyperglycemia (HCC) 03/31/2013    Priority: High   Morbid obesity with body mass index of 40.0-44.9 in adult [facility]) 05/05/2012    Priority: High   DVT of left distal popliteal vein 05/05/2012    Priority: High   Hyperlipidemia associated with type 2 diabetes mellitus (HCC) 03/25/2022    Priority: Medium    Acid indigestion 08/09/2021    Priority: Medium    History of small bowel obstruction 08/20/2018    Priority: Medium    History of diabetic ulcer of foot 03/21/2018    Priority: Medium    S/P repair of recurrent ventral hernia 04/26/2017    Priority: Medium    Chronic venous insufficiency 01/25/2015    Priority: Medium    Obstructive sleep apnea 05/05/2012    Priority: Medium    Eczema 12/27/2019    Priority: Low   Erectile dysfunction 12/27/2019    Priority: Low   Renal stone 05/12/2017    Priority: Low   Hyponatremia 03/23/2013    Priority: Low   Positive colorectal cancer screening using Cologuard test 08/09/2021   Hematochezia 08/09/2021   Pressure injury of skin 02/25/2021   Chronic anticoagulation 02/19/2021   Incarcerated ventral hernia 12/13/2013   Leukocytosis 03/23/2013    Medications- reviewed and  updated Current Outpatient Medications  Medication Sig Dispense Refill   ACCU-CHEK GUIDE test strip USE AS DIRECTED UP TO 4 TIMES DAILY (Patient taking differently: 1 each by Other route 4 (four) times daily.) 100 strip 3   acetaminophen (TYLENOL) 500 MG tablet Take 500-1,000 mg by mouth every 6 (six) hours as needed for mild pain or headache.     aspirin EC 81 MG tablet Take 1 tablet (81 mg total) by mouth daily. Swallow whole. 90 tablet 3   atorvastatin (LIPITOR) 80 MG tablet TAKE 1 TABLET BY MOUTH EVERY DAY 90 tablet 1   blood glucose meter kit and supplies KIT Dispense based on patient and insurance preference. Use up to four times daily as directed. E11.65 (Patient taking differently: Inject 1 each into the skin See admin instructions. Dispense based on patient and insurance preference. Use up to four times daily as directed. E11.65) 1 each 0   ELIQUIS 5 MG TABS tablet TAKE 1 TABLET BY MOUTH TWICE A DAY 60 tablet 5   metFORMIN (GLUCOPHAGE-XR) 500 MG 24 hr tablet TAKE 2 TABLETS (1,000 MG TOTAL) BY MOUTH IN THE MORNING AND AT BEDTIME. 360 tablet 1   metoprolol succinate (TOPROL-XL) 50 MG 24 hr tablet TAKE 1 TABLET BY MOUTH EVERY DAY 90 tablet 3   Multiple Vitamins-Minerals (CENTRUM SILVER 50+MEN) TABS Take 1 tablet by mouth daily with  breakfast.     pantoprazole (PROTONIX) 40 MG tablet Take 1 tablet (40 mg total) by mouth daily. 90 tablet 3   PRESCRIPTION MEDICATION Inhale 1 Device into the lungs See admin instructions. CPAP- At bedtime every night     Semaglutide,0.25 or 0.5MG /DOS, (OZEMPIC, 0.25 OR 0.5 MG/DOSE,) 2 MG/3ML SOPN INJECT 0.25 MG INTO THE SKIN ONCE A WEEK FOR 28 DAYS, THEN 0.5 MG ONCE A WEEK. 3 mL 5   nitroGLYCERIN (NITROSTAT) 0.4 MG SL tablet Place 1 tablet (0.4 mg total) under the tongue every 5 (five) minutes as needed for chest pain. (Patient not taking: Reported on 01/06/2023) 25 tablet 1   No current facility-administered medications for this visit.     Objective:  BP 122/76    Pulse 85   Temp 98 F (36.7 C)   Ht 6' 1.25" (1.861 m)   Wt (!) 323 lb 3.2 oz (146.6 kg)   SpO2 96%   BMI 42.35 kg/m  Gen: NAD, resting comfortably CV: RRR no murmurs rubs or gallops Lungs: CTAB no crackles, wheeze, rhonchi Abdomen: soft/nontender/nondistended/normal bowel sounds. No rebound or guarding.  Ext: no edema Skin: warm, dry Rectal: normal tone, diffusely enlarged prostate, no masses or tenderness    Results for orders placed or performed in visit on 01/06/23 (from the past 24 hour(s))  POCT Urinalysis Dipstick (Automated)     Status: None   Collection Time: 01/06/23  2:14 PM  Result Value Ref Range   Color, UA yellow    Clarity, UA clear    Glucose, UA Negative Negative   Bilirubin, UA neg    Ketones, UA neg    Spec Grav, UA 1.020 1.010 - 1.025   Blood, UA neg    pH, UA 6.0 5.0 - 8.0   Protein, UA Negative Negative   Urobilinogen, UA 1.0 0.2 or 1.0 E.U./dL   Nitrite, UA positive    Leukocytes, UA Negative Negative       Assessment and Plan   # Dark urine with odor S: Patient complains of dark urine with odor starting 2 weeks ago- not worsening. No burning and no fever.  No back pain with this -he has a history of recurrent urinary tract infection in 2023 and some in 2024 and previously stopped Jardiance and referred to urology on 11/23/2021 due to cloudy urine/hematospermia (no recent blodo in semen but has had several UTIs) A/P: dark urine with odoro and nitrite positive- possible urinary tract infection- sinc esymptoms stable over 2 weeks opted to wait for culture- if positive refer back to urology  -rectal exam without obvious prostatitis- some bph likely contributes to symptoms  #CAD-NSTEMI in August 2022 secondary to severe mid RCA stenosis. This was treated with a drug eluting stent. LV function normal by echo. No chest pain. Plavix until August 2023 at--> now on ASA only. He is also on Eliquis.  #hyperlipidemia S: Medication:Atorvastatin 80 mg, asa 81  mg- Nitroglycerin on hand- not needing  -Also on metoprolol 50 mg extended release  -no chest pain or shortness of breath  Lab Results  Component Value Date   CHOL 117 07/29/2022   HDL 34.70 (L) 07/29/2022   LDLCALC 53 07/29/2022   LDLDIRECT 67.0 03/25/2022   TRIG 148.0 07/29/2022   CHOLHDL 3 07/29/2022  A/P: coronary artery disease asymptomatic continue current medications Lipids at goal- continue current medications as long as LDL under 70  #Chronic pulmonary embolism/DVT S: Medication: Patient mains on Eliquis 5 mg twice daily due to history  of recurrent DVT and PE A/P: doing  well- continue current medications    # Diabetes S: Medication: metformin 1000 mg twice daily, ozempic retrial 02/2022 -Significant nausea issues in the past on Ozempic  -now notes more energy with weight loss - UTIs with Jardiance unfortunately CBGs-  99 this morning Exercise and diet- down 18 lbs since last visit  A/P: hopefully significant improvement- update a1c today -asked him to contact me if weight loss stagnates for perhaps 3 weeks then could go to higher dose- just being cautious with prior nausea issues   # GERD and has been told hiatal hernia S:Medication: Pantoprazole 40 mg A/P: stable- continue current medicines    #OSA-compliant with CPAP   Recommended follow up: Return in about 4 months (around 05/08/2023) for physical or sooner if needed.Schedule b4 you leave.  Lab/Order associations:   ICD-10-CM   1. Dark urine  R82.998 Urine Culture    POCT Urinalysis Dipstick (Automated)    Urine Microalbumin w/creat. ratio    2. Type 2 diabetes mellitus with hyperglycemia, without long-term current use of insulin (HCC)  E11.65 Urine Microalbumin w/creat. ratio    Comprehensive metabolic panel    CBC with Differential/Platelet    Hemoglobin A1c    3. 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 acute cor pulmonale (HCC)  I27.82     4. Hyperlipidemia associated with type 2 diabetes mellitus  (HCC)  E11.69    E78.5       No orders of the defined types were placed in this encounter.   Return precautions advised.  Tana Conch, MD

## 2023-01-17 ENCOUNTER — Encounter (HOSPITAL_COMMUNITY): Payer: Self-pay

## 2023-01-17 ENCOUNTER — Ambulatory Visit (HOSPITAL_COMMUNITY)
Admission: EM | Admit: 2023-01-17 | Discharge: 2023-01-17 | Disposition: A | Discharge status: Home / Self Care | Payer: PRIVATE HEALTH INSURANCE | Attending: Internal Medicine | Admitting: Internal Medicine

## 2023-01-17 DIAGNOSIS — N3000 Acute cystitis without hematuria: Secondary | ICD-10-CM | POA: Diagnosis not present

## 2023-01-17 LAB — POCT URINALYSIS DIP (MANUAL ENTRY)
Bilirubin, UA: NEGATIVE
Blood, UA: NEGATIVE
Glucose, UA: NEGATIVE mg/dL
Ketones, POC UA: NEGATIVE mg/dL
Nitrite, UA: NEGATIVE
Protein Ur, POC: NEGATIVE mg/dL
Spec Grav, UA: 1.025 (ref 1.010–1.025)
Urobilinogen, UA: 0.2 E.U./dL
pH, UA: 7 (ref 5.0–8.0)

## 2023-01-17 MED ORDER — CEPHALEXIN 500 MG PO CAPS: 500.0000 mg | ORAL_CAPSULE | Freq: Three times a day (TID) | ORAL | 0 refills | Status: AC

## 2023-01-17 NOTE — ED Provider Notes (Signed)
[facility]    CSN: [id number] Arrival date & time: 01/17/23  1455      History   Chief Complaint Chief Complaint  Patient presents with   Urinary Tract Infection    HPI Evan Leonard is a 62 y.o. male.   Patient presents with concerns of possible UTI. He reports history of UTIs in the past, last was a few months ago. He reports about 2 weeks ago he started to feel like one might be coming on. He states the past 3 days or so the symptoms have worsened. He reports burning and pain with urination as well as frequency and urgency. He states the urine is cloudy and has a strong odor. The patient reports similar symptoms with past UTI. He denies abdominal or back pain, n/v, fever, or discharge. He has not taken anything for his symptoms.  The history is provided by the patient.  Urinary Tract Infection Presenting symptoms: dysuria   Presenting symptoms: no penile discharge   Associated symptoms: urinary frequency   Associated symptoms: no abdominal pain, no fever and no hematuria     Past Medical History:  Diagnosis Date   CAD (coronary artery disease)    Diabetes mellitus, type II (HCC) 02/2013   Diabetic ulcer of right foot due to type 2 diabetes mellitus (HCC) 03/21/2018   DVT (deep venous thrombosis) (HCC) 05/04/2012   LLE   Exertional dyspnea 05/04/2012   "isolated episode" (05/05/2012)   Hepatic steatosis    History of chickenpox    Hypertension 03/21/2018   Obesity, Class III, BMI 40-49.9 (morbid obesity) (HCC) 03/21/2018   OSA on CPAP    Peripheral vascular disease (HCC)    varicose veins   Pulmonary embolism (HCC) 05/04/2012   bilaterally   Pyelonephritis 04/2017   Small bowel obstruction (HCC) 09/06/2015   Varicose veins     Patient Active Problem List   Diagnosis Date Noted   Hyperlipidemia associated with type 2 diabetes mellitus (HCC) 03/25/2022   Positive colorectal cancer screening using Cologuard test 08/09/2021   Acid indigestion  08/09/2021   Hematochezia 08/09/2021   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 acute cor pulmonale (HCC) 06/05/2021   Pressure injury of skin 02/25/2021   Chronic anticoagulation 02/19/2021   CAD (coronary artery disease) 01/09/2021   Eczema 12/27/2019   Erectile dysfunction 12/27/2019   History of small bowel obstruction 08/20/2018   History of diabetic ulcer of foot 03/21/2018   Atypical chest pain 02/09/2018   Renal stone 05/12/2017   S/P repair of recurrent ventral hernia 04/26/2017   History of pulmonary embolism 07/16/2016   Chronic venous insufficiency 01/25/2015   Incarcerated ventral hernia 12/13/2013   Diabetes mellitus with hyperglycemia (HCC) 03/31/2013   Leukocytosis 03/23/2013   Hyponatremia 03/23/2013   Morbid obesity with body mass index of 40.0-44.9 in adult [facility]) 05/05/2012   Obstructive sleep apnea 05/05/2012   DVT of left distal popliteal vein 05/05/2012    Past Surgical History:  Procedure Laterality Date   BOWEL RESECTION N/A 02/20/2021   Procedure: SMALL BOWEL RESECTION;  Surgeon: Rodman Pickle, MD;  Location: [facility] OR;  Service: General;  Laterality: N/A;   CORONARY STENT INTERVENTION N/A 01/02/2021   Procedure: CORONARY STENT INTERVENTION;  Surgeon: Corky Crafts, MD;  [facility];  Service: Cardiovascular;  Laterality: N/A;   CYSTOSCOPY W/ URETERAL STENT PLACEMENT Right 05/12/2017   Procedure: CYSTOSCOPY WITH RETROGRADE PYELOGRAM/URETERAL STENT PLACEMENT;  Surgeon: Bjorn Pippin, MD;  Location: [facility] OR;  Service:  Urology;  Laterality: Right;   CYSTOSCOPY/URETEROSCOPY/HOLMIUM LASER/STENT PLACEMENT Right 07/29/2017   Procedure: CYSTOSCOPY RIGHT URETEROSCOPY HOLMIUM LASER STENT EXCHANGE;  Surgeon: Bjorn Pippin, MD;  Location: [facility] ORS;  Service: Urology;  Laterality: Right;   HERNIA REPAIR     2015   LAPAROTOMY N/A 12/13/2013   Procedure: EXPLORATORY LAPAROTOMY Repair ventral hernia, without mesh, partial omentectomy;  Surgeon: Cherylynn Ridges, MD;  Location: [facility] OR;  Service: General;  Laterality: N/A;   LEFT HEART CATH AND CORONARY ANGIOGRAPHY N/A 01/02/2021   Procedure: LEFT HEART CATH AND CORONARY ANGIOGRAPHY;  Surgeon: Corky Crafts, MD;  [facility] INV[facility] CV LAB;  Service: Cardiovascular;  Laterality: N/A;   LYSIS OF ADHESION  02/20/2021   Procedure: LYSIS OF ADHESION;  Surgeon: Sheliah Hatch, De Blanch, MD;  Location: [facility] OR;  Service: General;;   VENTRAL HERNIA REPAIR N/A 04/26/2017   Procedure: INCARCERATED HERNIA REPAIR VENTRAL ADULT;  Surgeon: Violeta Gelinas, MD;  Location: [facility] OR;  Service: General;  Laterality: N/A;   VENTRAL HERNIA REPAIR N/A 02/20/2021   Procedure: EXPLORATORY LAPAROTOMY; RECURRENT HERNIA REPAIR WITH OVERLAY MESH;  Surgeon: Kinsinger, De Blanch, MD;  Location: [facility] OR;  Service: General;  Laterality: N/A;       Home Medications    Prior to Admission medications   Medication Sig Start Date End Date Taking? Authorizing Provider  cephALEXin (KEFLEX) 500 MG capsule Take 1 capsule (500 mg total) by mouth 3 (three) times daily for 7 days. 01/17/23 01/24/23 Yes Shaquoya Cosper L, PA  ACCU-CHEK GUIDE test strip USE AS DIRECTED UP TO 4 TIMES DAILY Patient taking differently: 1 each by Other route 4 (four) times daily. 12/30/19   Shelva Majestic, MD  acetaminophen (TYLENOL) 500 MG tablet Take 500-1,000 mg by mouth every 6 (six) hours as needed for mild pain or headache.    [provider]  aspirin EC 81 MG tablet Take 1 tablet (81 mg total) by mouth daily. Swallow whole. 02/18/22   Kathleene Hazel, MD  atorvastatin (LIPITOR) 80 MG tablet TAKE 1 TABLET BY MOUTH EVERY DAY 09/30/22   Shelva Majestic, MD  blood glucose meter kit and supplies KIT Dispense based on patient and insurance preference. Use up to four times daily as directed. E11.65 Patient taking differently: Inject 1 each into the skin See admin instructions. Dispense based on patient and insurance preference. Use up to four times  daily as directed. E11.65 12/09/19   Shelva Majestic, MD  ELIQUIS 5 MG TABS tablet TAKE 1 TABLET BY MOUTH TWICE A DAY 04/29/22   Shelva Majestic, MD  metFORMIN (GLUCOPHAGE-XR) 500 MG 24 hr tablet TAKE 2 TABLETS (1,000 MG TOTAL) BY MOUTH IN THE MORNING AND AT BEDTIME. 09/18/22   Shelva Majestic, MD  metoprolol succinate (TOPROL-XL) 50 MG 24 hr tablet TAKE 1 TABLET BY MOUTH EVERY DAY 12/23/22   Shelva Majestic, MD  Multiple Vitamins-Minerals (CENTRUM SILVER 50+MEN) TABS Take 1 tablet by mouth daily with breakfast.    [provider]  nitroGLYCERIN (NITROSTAT) 0.4 MG SL tablet Place 1 tablet (0.4 mg total) under the tongue every 5 (five) minutes as needed for chest pain. Patient not taking: Reported on 01/06/2023 01/23/22   Shelva Majestic, MD  pantoprazole (PROTONIX) 40 MG tablet Take 1 tablet (40 mg total) by mouth daily. 03/25/22   Shelva Majestic, MD  PRESCRIPTION MEDICATION Inhale 1 Device into the lungs See admin instructions. CPAP- At bedtime every night    [provider]  Semaglutide,0.25 or 0.5MG /DOS, (OZEMPIC, 0.25 OR 0.5 MG/DOSE,) 2 MG/3ML SOPN INJECT 0.25 MG INTO THE SKIN ONCE A WEEK FOR 28 DAYS, THEN 0.5 MG ONCE A WEEK. 10/22/22 01/28/23  Shelva Majestic, MD  sildenafil (VIAGRA) 100 MG tablet Take 1 tablet (100 mg total) by mouth daily as needed for erectile dysfunction (do not take nitroglycerin within 24 hours of use). 01/06/23   Shelva Majestic, MD    Family History Family History  Problem Relation Age of Onset   Diabetes Mother        died age 9- also bleeding ulcer   Hypertension Mother    Heart failure Father        in his 72s (patient was 64). day before surgery planned   Other Maternal Grandmother        states natural causes all grandparents   Colon cancer Neg Hx    Esophageal cancer Neg Hx    Inflammatory bowel disease Neg Hx    Liver disease Neg Hx    Pancreatic cancer Neg Hx    Rectal cancer Neg Hx    Stomach cancer Neg Hx     Social  History Social History   Tobacco Use   Smoking status: Never   Smokeless tobacco: Never  Vaping Use   Vaping status: Never Used  Substance Use Topics   Alcohol use: No    Alcohol/week: 0.0 standard drinks of alcohol   Drug use: No     Allergies   Patient has no known allergies.   Review of Systems Review of Systems  Constitutional:  Negative for fatigue and fever.  Gastrointestinal:  Negative for abdominal pain.  Genitourinary:  Positive for dysuria, frequency and urgency. Negative for difficulty urinating, hematuria and penile discharge.  Musculoskeletal:  Negative for back pain.  Skin:  Negative for rash.     Physical Exam Triage Vital Signs ED Triage Vitals  Encounter Vitals Group     BP 01/17/23 1715 (!) 144/83     Systolic BP Percentile --      Diastolic BP Percentile --      Pulse Rate 01/17/23 1715 78     Resp 01/17/23 1715 18     Temp 01/17/23 1715 98.2 F (36.8 C)     Temp Source 01/17/23 1715 Oral     SpO2 01/17/23 1715 96 %     Weight --      Height --      Head Circumference --      Peak Flow --      Pain Score 01/17/23 1716 0     Pain Loc --      Pain Education --      Exclude from Growth Chart --    No data found.  Updated Vital Signs BP (!) 144/83 (BP Location: Left Arm)   Pulse 78   Temp 98.2 F (36.8 C) (Oral)   Resp 18   SpO2 96%   Visual Acuity Right Eye Distance:   Left Eye Distance:   Bilateral Distance:    Right Eye Near:   Left Eye Near:    Bilateral Near:     Physical Exam Vitals and nursing note reviewed.  Constitutional:      General: He is not in acute distress. Cardiovascular:     Rate and Rhythm: Normal rate and regular rhythm.     Heart sounds: Normal heart sounds.  Pulmonary:     Effort: Pulmonary effort is normal.  Breath sounds: Normal breath sounds.  Abdominal:     Palpations: Abdomen is soft.     Tenderness: There is no abdominal tenderness. There is no right CVA tenderness, left CVA tenderness,  guarding or rebound.  Neurological:     Mental Status: He is alert.  Psychiatric:        Mood and Affect: Mood normal.      UC Treatments / Results  Labs (all labs ordered are listed, but only abnormal results are displayed) Labs Reviewed  POCT URINALYSIS DIP (MANUAL ENTRY) - Abnormal; Notable for the following components:      Result Value   Clarity, UA cloudy (*)    Leukocytes, UA Small (1+) (*)    All other components within normal limits  URINE CULTURE    EKG   Radiology No results found.  Procedures Procedures (including critical care time)  Medications Ordered in UC Medications - No data to display  Initial Impression / Assessment and Plan / UC Course  I have reviewed the triage vital signs and the nursing notes.  Pertinent labs & imaging results that were available during my care of the patient were reviewed by me and considered in my medical decision making (see chart for details).     Empiric tx for UTI given sx and leuk on urine. Will send out for culture. Last culture from May showed resistance to Fqs and Bactrim, so will start on Keflex. Discussed ER precautions and encouraged to follow-up with PCP for possible urology given recurrent UTIs.   E/M: 1 acute uncomplicated illness, 2 data (UA, cx), moderate risk due to prescription management  Final Clinical Impressions(s) / UC Diagnoses   Final diagnoses:  Acute cystitis without hematuria     Discharge Instructions      Take antibiotics as prescribed for UTI. We are sending urine to the lab for culture and will contact you in a few days if we need to change the antibiotic. Follow-up with PCP for possible referral to urology.     ED Prescriptions     Medication Sig Dispense Auth. Provider   cephALEXin (KEFLEX) 500 MG capsule Take 1 capsule (500 mg total) by mouth 3 (three) times daily for 7 days. 21 capsule Vallery Sa, Shelvy Perazzo L, PA      PDMP not reviewed this encounter.   Estanislado Pandy, [place] 01/17/23  [phone]

## 2023-01-17 NOTE — Discharge Instructions (Signed)
Take antibiotics as prescribed for UTI. We are sending urine to the lab for culture and will contact you in a few days if we need to change the antibiotic. Follow-up with PCP for possible referral to urology.

## 2023-01-17 NOTE — ED Triage Notes (Signed)
Pt c/o burning on urination with urgency/frequency/cloudy and odorous x3 days. States hx of UTI's

## 2023-04-07 ENCOUNTER — Ambulatory Visit
Admission: EM | Admit: 2023-04-07 | Discharge: 2023-04-07 | Disposition: A | Discharge status: Home / Self Care | Payer: PRIVATE HEALTH INSURANCE | Attending: Internal Medicine | Admitting: Internal Medicine

## 2023-04-07 DIAGNOSIS — N3001 Acute cystitis with hematuria: Secondary | ICD-10-CM | POA: Diagnosis present

## 2023-04-07 DIAGNOSIS — R300 Dysuria: Secondary | ICD-10-CM | POA: Diagnosis not present

## 2023-04-07 LAB — POCT URINALYSIS DIP (MANUAL ENTRY)
Bilirubin, UA: NEGATIVE
Glucose, UA: NEGATIVE mg/dL
Ketones, POC UA: NEGATIVE mg/dL
Nitrite, UA: POSITIVE — AB
Protein Ur, POC: NEGATIVE mg/dL
Spec Grav, UA: 1.02 (ref 1.010–1.025)
Urobilinogen, UA: 0.2 U/dL
pH, UA: 7 (ref 5.0–8.0)

## 2023-04-07 MED ORDER — AMOXICILLIN-POT CLAVULANATE 875-125 MG PO TABS: 1.0000 | ORAL_TABLET | Freq: Two times a day (BID) | ORAL | 0 refills | Status: DC

## 2023-04-07 NOTE — ED Triage Notes (Signed)
Pt reports burning with urination, cloudy urine, orange color, and foul odor x 1 week

## 2023-04-07 NOTE — ED Provider Notes (Signed)
[facility]    CSN: [id number] Arrival date & time: 04/07/23  1440      History   Chief Complaint No chief complaint on file.   HPI Evan Leonard is a 62 y.o. male.   Patient presents with concern for urinary tract infection today given approximately 1 week history of dysuria, malodorous urine, cloudy urine.  Patient reports he has a history of frequent urinary tract infections with last 1 being in August.  Reports that his PCP referred him to urologist but no appointment has been made yet.  Denies fever, abdominal pain, back pain.     Past Medical History:  Diagnosis Date   CAD (coronary artery disease)    Diabetes mellitus, type II (HCC) 02/2013   Diabetic ulcer of right foot due to type 2 diabetes mellitus (HCC) 03/21/2018   DVT (deep venous thrombosis) (HCC) 05/04/2012   LLE   Exertional dyspnea 05/04/2012   "isolated episode" (05/05/2012)   Hepatic steatosis    History of chickenpox    Hypertension 03/21/2018   Obesity, Class III, BMI 40-49.9 (morbid obesity) (HCC) 03/21/2018   OSA on CPAP    Peripheral vascular disease (HCC)    varicose veins   Pulmonary embolism (HCC) 05/04/2012   bilaterally   Pyelonephritis 04/2017   Small bowel obstruction (HCC) 09/06/2015   Varicose veins     Patient Active Problem List   Diagnosis Date Noted   Hyperlipidemia associated with type 2 diabetes mellitus (HCC) 03/25/2022   Positive colorectal cancer screening using Cologuard test 08/09/2021   Acid indigestion 08/09/2021   Hematochezia 08/09/2021   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 acute cor pulmonale (HCC) 06/05/2021   Pressure injury of skin 02/25/2021   Chronic anticoagulation 02/19/2021   CAD (coronary artery disease) 01/09/2021   Eczema 12/27/2019   Erectile dysfunction 12/27/2019   History of small bowel obstruction 08/20/2018   History of diabetic ulcer of foot 03/21/2018   Atypical chest pain 02/09/2018   Renal stone 05/12/2017   S/P  repair of recurrent ventral hernia 04/26/2017   History of pulmonary embolism 07/16/2016   Chronic venous insufficiency 01/25/2015   Incarcerated ventral hernia 12/13/2013   Diabetes mellitus with hyperglycemia (HCC) 03/31/2013   Leukocytosis 03/23/2013   Hyponatremia 03/23/2013   Morbid obesity with body mass index of 40.0-44.9 in adult [facility]) 05/05/2012   Obstructive sleep apnea 05/05/2012   DVT of left distal popliteal vein 05/05/2012    Past Surgical History:  Procedure Laterality Date   BOWEL RESECTION N/A 02/20/2021   Procedure: SMALL BOWEL RESECTION;  Surgeon: Rodman Pickle, MD;  Location: [facility] OR;  Service: General;  Laterality: N/A;   CORONARY STENT INTERVENTION N/A 01/02/2021   Procedure: CORONARY STENT INTERVENTION;  Surgeon: Corky Crafts, MD;  [facility];  Service: Cardiovascular;  Laterality: N/A;   CYSTOSCOPY W/ URETERAL STENT PLACEMENT Right 05/12/2017   Procedure: CYSTOSCOPY WITH RETROGRADE PYELOGRAM/URETERAL STENT PLACEMENT;  Surgeon: Bjorn Pippin, MD;  Location: [facility] OR;  Service: Urology;  Laterality: Right;   CYSTOSCOPY/URETEROSCOPY/HOLMIUM LASER/STENT PLACEMENT Right 07/29/2017   Procedure: CYSTOSCOPY RIGHT URETEROSCOPY HOLMIUM LASER STENT EXCHANGE;  Surgeon: Bjorn Pippin, MD;  Location: [facility]S;  Service: Urology;  Laterality: Right;   HERNIA REPAIR     2015   LAPAROTOMY N/A 12/13/2013   Procedure: EXPLORATORY LAPAROTOMY Repair ventral hernia, without mesh, partial omentectomy;  Surgeon: Cherylynn Ridges, MD;  Location[facility] OR;  Service: General;  Laterality: N/A;   LEFT HEART CATH AND CORONARY  ANGIOGRAPHY N/A 01/02/2021   Procedure: LEFT HEART CATH AND CORONARY ANGIOGRAPHY;  Surgeon: Corky Crafts, MD;  Location: [facility] INVASIVE CV LAB;  Service: Cardiovascular;  Laterality: N/A;   LYSIS OF ADHESION  02/20/2021   Procedure: LYSIS OF ADHESION;  Surgeon: Sheliah Hatch, De Blanch, MD;  Location: [facility] OR;  Service: General;;   VENTRAL HERNIA REPAIR N/A  04/26/2017   Procedure: INCARCERATED HERNIA REPAIR VENTRAL ADULT;  Surgeon: Violeta Gelinas, MD;  Location: [facility] OR;  Service: General;  Laterality: N/A;   VENTRAL HERNIA REPAIR N/A 02/20/2021   Procedure: EXPLORATORY LAPAROTOMY; RECURRENT HERNIA REPAIR WITH OVERLAY MESH;  Surgeon: Kinsinger, De Blanch, MD;  Location: [facility] OR;  Service: General;  Laterality: N/A;       Home Medications    Prior to Admission medications   Medication Sig Start Date End Date Taking? Authorizing Provider  amoxicillin-clavulanate (AUGMENTIN) 875-125 MG tablet Take 1 tablet by mouth every 12 (twelve) hours. 04/07/23  Yes Dashanna Kinnamon, Rolly Salter E, FNP  ACCU-CHEK GUIDE test strip USE AS DIRECTED UP TO 4 TIMES DAILY Patient taking differently: 1 each by Other route 4 (four) times daily. 12/30/19   Shelva Majestic, MD  acetaminophen (TYLENOL) 500 MG tablet Take 500-1,000 mg by mouth every 6 (six) hours as needed for mild pain or headache.    [provider]  aspirin EC 81 MG tablet Take 1 tablet (81 mg total) by mouth daily. Swallow whole. 02/18/22   Kathleene Hazel, MD  atorvastatin (LIPITOR) 80 MG tablet TAKE 1 TABLET BY MOUTH EVERY DAY 09/30/22   Shelva Majestic, MD  blood glucose meter kit and supplies KIT Dispense based on patient and insurance preference. Use up to four times daily as directed. E11.65 Patient taking differently: Inject 1 each into the skin See admin instructions. Dispense based on patient and insurance preference. Use up to four times daily as directed. E11.65 12/09/19   Shelva Majestic, MD  ELIQUIS 5 MG TABS tablet TAKE 1 TABLET BY MOUTH TWICE A DAY 04/29/22   Shelva Majestic, MD  metFORMIN (GLUCOPHAGE-XR) 500 MG 24 hr tablet TAKE 2 TABLETS (1,000 MG TOTAL) BY MOUTH IN THE MORNING AND AT BEDTIME. 09/18/22   Shelva Majestic, MD  metoprolol succinate (TOPROL-XL) 50 MG 24 hr tablet TAKE 1 TABLET BY MOUTH EVERY DAY 12/23/22   Shelva Majestic, MD  Multiple Vitamins-Minerals (CENTRUM SILVER  50+MEN) TABS Take 1 tablet by mouth daily with breakfast.    [provider]  nitroGLYCERIN (NITROSTAT) 0.4 MG SL tablet Place 1 tablet (0.4 mg total) under the tongue every 5 (five) minutes as needed for chest pain. Patient not taking: Reported on 01/06/2023 01/23/22   Shelva Majestic, MD  pantoprazole (PROTONIX) 40 MG tablet Take 1 tablet (40 mg total) by mouth daily. 03/25/22   Shelva Majestic, MD  PRESCRIPTION MEDICATION Inhale 1 Device into the lungs See admin instructions. CPAP- At bedtime every night    [provider]  sildenafil (VIAGRA) 100 MG tablet Take 1 tablet (100 mg total) by mouth daily as needed for erectile dysfunction (do not take nitroglycerin within 24 hours of use). 01/06/23   Shelva Majestic, MD    Family History Family History  Problem Relation Age of Onset   Diabetes Mother        died age 34- also bleeding ulcer   Hypertension Mother    Heart failure Father        in his 31s (patient was 67). day  before surgery planned   Other Maternal Grandmother        states natural causes all grandparents   Colon cancer Neg Hx    Esophageal cancer Neg Hx    Inflammatory bowel disease Neg Hx    Liver disease Neg Hx    Pancreatic cancer Neg Hx    Rectal cancer Neg Hx    Stomach cancer Neg Hx     Social History Social History   Tobacco Use   Smoking status: Never   Smokeless tobacco: Never  Vaping Use   Vaping status: Never Used  Substance Use Topics   Alcohol use: No    Alcohol/week: 0.0 standard drinks of alcohol   Drug use: No     Allergies   Patient has no known allergies.   Review of Systems Review of Systems Per HPI  Physical Exam Triage Vital Signs ED Triage Vitals  Encounter Vitals Group     BP 04/07/23 1556 110/76     Systolic BP Percentile --      Diastolic BP Percentile --      Pulse Rate 04/07/23 1556 91     Resp 04/07/23 1556 20     Temp 04/07/23 1556 98.4 F (36.9 C)     Temp Source 04/07/23 1556 Oral     SpO2  04/07/23 1556 93 %     Weight --      Height --      Head Circumference --      Peak Flow --      Pain Score 04/07/23 1557 0     Pain Loc --      Pain Education --      Exclude from Growth Chart --    No data found.  Updated Vital Signs BP 110/76 (BP Location: Left Arm)   Pulse 91   Temp 98.4 F (36.9 C) (Oral)   Resp 20   SpO2 96%   Visual Acuity Right Eye Distance:   Left Eye Distance:   Bilateral Distance:    Right Eye Near:   Left Eye Near:    Bilateral Near:     Physical Exam Constitutional:      General: He is not in acute distress.    Appearance: Normal appearance. He is not toxic-appearing or diaphoretic.  HENT:     Head: Normocephalic and atraumatic.  Eyes:     Extraocular Movements: Extraocular movements intact.     Conjunctiva/sclera: Conjunctivae normal.  Pulmonary:     Effort: Pulmonary effort is normal.  Neurological:     General: No focal deficit present.     Mental Status: He is alert and oriented to person, place, and time. Mental status is at baseline.  Psychiatric:        Mood and Affect: Mood normal.        Behavior: Behavior normal.        Thought Content: Thought content normal.        Judgment: Judgment normal.      UC Treatments / Results  Labs (all labs ordered are listed, but only abnormal results are displayed) Labs Reviewed  POCT URINALYSIS DIP (MANUAL ENTRY) - Abnormal; Notable for the following components:      Result Value   Clarity, UA cloudy (*)    Blood, UA trace-intact (*)    Nitrite, UA Positive (*)    Leukocytes, UA Small (1+) (*)    All other components within normal limits  URINE CULTURE    EKG  Radiology No results found.  Procedures Procedures (including critical care time)  Medications Ordered in UC Medications - No data to display  Initial Impression / Assessment and Plan / UC Course  I have reviewed the triage vital signs and the nursing notes.  Pertinent labs & imaging results that were  available during my care of the patient were reviewed by me and considered in my medical decision making (see chart for details).     UA indicating UTI.  Will treat with Augmentin. crcl is normal so no dosage adjustment necessary.  Urine culture pending.  Advised adequate fluids.  Patient provided with contact information for urology and advised to follow-up with PCP to see if referral is in process.  Patient verbalized understanding and was agreeable with plan. Final Clinical Impressions(s) / UC Diagnoses   Final diagnoses:  Acute cystitis with hematuria  Dysuria     Discharge Instructions      I have prescribed you an antibiotic to take for UTI.  Take with food to avoid stomach upset.  Urine culture is pending.  Follow-up with urologist and primary care doctor.    ED Prescriptions     Medication Sig Dispense Auth. Provider   amoxicillin-clavulanate (AUGMENTIN) 875-125 MG tablet Take 1 tablet by mouth every 12 (twelve) hours. 14 tablet [place], Acie Fredrickson, [place]      PDMP not reviewed this encounter.   Gustavus Bryant, [place] 04/07/23 [phone]

## 2023-04-07 NOTE — Discharge Instructions (Signed)
I have prescribed you an antibiotic to take for UTI.  Take with food to avoid stomach upset.  Urine culture is pending.  Follow-up with urologist and primary care doctor.

## 2023-04-09 ENCOUNTER — Telehealth: Payer: Self-pay

## 2023-04-09 LAB — URINE CULTURE: Culture: >=100000 — AB

## 2023-04-09 MED ORDER — CIPROFLOXACIN HCL 500 MG PO TABS: 500.0000 mg | ORAL_TABLET | Freq: Two times a day (BID) | ORAL | 0 refills | Status: AC

## 2023-04-09 NOTE — Telephone Encounter (Signed)
Per Helaine Chess,  PA-C, "let's put him on Cipro. He has been given Augmentin for this infection twice since May. Give him 500 mg BID x 7 days."   Attempted to reach patient x1. LVM. Will sent Rx to pharmacy on file.

## 2023-04-13 ENCOUNTER — Other Ambulatory Visit: Payer: Self-pay | Admitting: Family Medicine

## 2023-05-26 ENCOUNTER — Other Ambulatory Visit: Payer: Self-pay | Admitting: Family Medicine

## 2023-05-26 ENCOUNTER — Ambulatory Visit (INDEPENDENT_AMBULATORY_CARE_PROVIDER_SITE_OTHER): Payer: PRIVATE HEALTH INSURANCE | Admitting: Family Medicine

## 2023-05-26 ENCOUNTER — Encounter: Payer: Self-pay | Admitting: Family Medicine

## 2023-05-26 VITALS — BP 124/82 | HR 78 | Temp 97.8°F | Ht 73.25 in | Wt 329.6 lb

## 2023-05-26 DIAGNOSIS — I2510 Atherosclerotic heart disease of native coronary artery without angina pectoris: Secondary | ICD-10-CM

## 2023-05-26 DIAGNOSIS — Z125 Encounter for screening for malignant neoplasm of prostate: Secondary | ICD-10-CM | POA: Diagnosis not present

## 2023-05-26 DIAGNOSIS — E1165 Type 2 diabetes mellitus with hyperglycemia: Secondary | ICD-10-CM

## 2023-05-26 DIAGNOSIS — Z23 Encounter for immunization: Secondary | ICD-10-CM

## 2023-05-26 DIAGNOSIS — E1169 Type 2 diabetes mellitus with other specified complication: Secondary | ICD-10-CM | POA: Diagnosis not present

## 2023-05-26 DIAGNOSIS — Z79899 Other long term (current) drug therapy: Secondary | ICD-10-CM | POA: Diagnosis not present

## 2023-05-26 DIAGNOSIS — E785 Hyperlipidemia, unspecified: Secondary | ICD-10-CM

## 2023-05-26 DIAGNOSIS — Z0000 Encounter for general adult medical examination without abnormal findings: Secondary | ICD-10-CM

## 2023-05-26 DIAGNOSIS — R5383 Other fatigue: Secondary | ICD-10-CM | POA: Diagnosis not present

## 2023-05-26 DIAGNOSIS — Z7984 Long term (current) use of oral hypoglycemic drugs: Secondary | ICD-10-CM | POA: Diagnosis not present

## 2023-05-26 LAB — LIPID PANEL
Cholesterol: 124 mg/dL (ref 0–200)
HDL: 40.4 mg/dL (ref >39.00)
LDL Cholesterol: 59 mg/dL (ref 0–99)
NonHDL: 83.3
Total CHOL/HDL Ratio: 3
Triglycerides: 123 mg/dL (ref 0.0–149.0)
VLDL: 24.6 mg/dL (ref 0.0–40.0)

## 2023-05-26 LAB — COMPREHENSIVE METABOLIC PANEL
ALT: 26 U/L (ref 0–53)
AST: 19 U/L (ref 0–37)
Albumin: 4 g/dL (ref 3.5–5.2)
Alkaline Phosphatase: 65 U/L (ref 39–117)
BUN: 19 mg/dL (ref 6–23)
CO2: 27 meq/L (ref 19–32)
Calcium: 9.1 mg/dL (ref 8.4–10.5)
Chloride: 104 meq/L (ref 96–112)
Creatinine, Ser: 1.02 mg/dL (ref 0.40–1.50)
GFR: 77.92 mL/min (ref >60.00)
Glucose, Bld: 159 mg/dL — ABNORMAL HIGH (ref 70–99)
Potassium: 4.6 meq/L (ref 3.5–5.1)
Sodium: 140 meq/L (ref 135–145)
Total Bilirubin: 0.7 mg/dL (ref 0.2–1.2)
Total Protein: 7.2 g/dL (ref 6.0–8.3)

## 2023-05-26 LAB — CBC WITH DIFFERENTIAL/PLATELET
Basophils Absolute: 0 10*3/uL (ref 0.0–0.1)
Basophils Relative: 0.6 % (ref 0.0–3.0)
Eosinophils Absolute: 0.4 10*3/uL (ref 0.0–0.7)
Eosinophils Relative: 4.8 % (ref 0.0–5.0)
HCT: 39.9 % (ref 39.0–52.0)
Hemoglobin: 13.1 g/dL (ref 13.0–17.0)
Lymphocytes Relative: 28.2 % (ref 12.0–46.0)
Lymphs Abs: 2.1 10*3/uL (ref 0.7–4.0)
MCHC: 32.9 g/dL (ref 30.0–36.0)
MCV: 88.2 fL (ref 78.0–100.0)
Monocytes Absolute: 0.5 10*3/uL (ref 0.1–1.0)
Monocytes Relative: 7 % (ref 3.0–12.0)
Neutro Abs: 4.5 10*3/uL (ref 1.4–7.7)
Neutrophils Relative %: 59.4 % (ref 43.0–77.0)
Platelets: 187 10*3/uL (ref 150.0–400.0)
RBC: 4.53 Mil/uL (ref 4.22–5.81)
RDW: 16.5 % — ABNORMAL HIGH (ref 11.5–15.5)
WBC: 7.5 10*3/uL (ref 4.0–10.5)

## 2023-05-26 LAB — URINALYSIS, ROUTINE W REFLEX MICROSCOPIC
Bilirubin Urine: NEGATIVE
Hgb urine dipstick: NEGATIVE
Ketones, ur: NEGATIVE
Leukocytes,Ua: NEGATIVE
Nitrite: NEGATIVE
RBC / HPF: NONE SEEN (ref 0–?)
Specific Gravity, Urine: 1.02 (ref 1.000–1.030)
Total Protein, Urine: NEGATIVE
Urine Glucose: NEGATIVE
Urobilinogen, UA: 0.2 (ref 0.0–1.0)
pH: 6.5 (ref 5.0–8.0)

## 2023-05-26 LAB — VITAMIN B12: Vitamin B-12: 288 pg/mL (ref 211–911)

## 2023-05-26 LAB — TSH: TSH: 0.6 u[IU]/mL (ref 0.35–5.50)

## 2023-05-26 LAB — HEMOGLOBIN A1C: Hgb A1c MFr Bld: 8.1 % — ABNORMAL HIGH (ref 4.6–6.5)

## 2023-05-26 LAB — VITAMIN D 25 HYDROXY (VIT D DEFICIENCY, FRACTURES): VITD: 24.46 ng/mL — ABNORMAL LOW (ref 30.00–100.00)

## 2023-05-26 LAB — PSA: PSA: 1.81 ng/mL (ref 0.10–4.00)

## 2023-05-26 MED ORDER — VITAMIN D (ERGOCALCIFEROL) 1.25 MG (50000 UNIT) PO CAPS
50000.0000 [IU] | ORAL_CAPSULE | ORAL | 0 refills | Status: DC
Start: 2023-05-26 — End: 2023-09-22

## 2023-05-26 MED ORDER — SEMAGLUTIDE (1 MG/DOSE) 4 MG/3ML ~~LOC~~ SOPN: 1.0000 mg | PEN_INJECTOR | SUBCUTANEOUS | 4 refills | Status: DC

## 2023-05-26 MED ORDER — NITROGLYCERIN 0.4 MG SL SUBL: 0.4000 mg | SUBLINGUAL_TABLET | SUBLINGUAL | 1 refills | Status: AC | PRN

## 2023-05-26 MED ORDER — PANTOPRAZOLE SODIUM 40 MG PO TBEC: 40.0000 mg | DELAYED_RELEASE_TABLET | Freq: Every day | ORAL | 3 refills | Status: DC

## 2023-05-26 NOTE — Patient Instructions (Addendum)
Have them send [place] copy after updating eye exam  Try to get updated dental exam   Please stop by lab before you go If you have mychart- we will send your results within 3 business days of [place] receiving them.  If you do not have mychart- we will call you about results within 5 business days of [place] receiving them.  *please also note that you will see labs on mychart as soon as they post. I will later go in and write notes on them- will say "notes from Dr. Durene Cal"   Recommended follow up: Return in about 4 months (around 09/24/2023) for followup or sooner if needed.Schedule b4 you leave.

## 2023-05-26 NOTE — Progress Notes (Signed)
Phone: [phone]   Subjective:  Patient presents today for their annual physical. Chief complaint-noted.   See problem oriented charting- ROS- full  review of systems was completed and negative  except for:  weight gain , a few cold chills in middle of night but never ended up ill- no night sweats (sugar was ok), fatigue particularly in afternoon- came off of B12 previously  The following were reviewed and entered/updated in epic: Past Medical History:  Diagnosis Date   CAD (coronary artery disease)    Diabetes mellitus, type II (HCC) 02/2013   Diabetic ulcer of right foot due to type 2 diabetes mellitus (HCC) 03/21/2018   DVT (deep venous thrombosis) (HCC) 05/04/2012   LLE   Exertional dyspnea 05/04/2012   "isolated episode" (05/05/2012)   Hepatic steatosis    History of chickenpox    Hypertension 03/21/2018   Obesity, Class III, BMI 40-49.9 (morbid obesity) (HCC) 03/21/2018   OSA on CPAP    Peripheral vascular disease (HCC)    varicose veins   Pulmonary embolism (HCC) 05/04/2012   bilaterally   Pyelonephritis 04/2017   Small bowel obstruction (HCC) 09/06/2015   Varicose veins    Patient Active Problem List   Diagnosis Date Noted   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 acute cor pulmonale (HCC) 06/05/2021    Priority: High   CAD (coronary artery disease) 01/09/2021    Priority: High   Atypical chest pain 02/09/2018    Priority: High   History of pulmonary embolism 07/16/2016    Priority: High   Diabetes mellitus with hyperglycemia (HCC) 03/31/2013    Priority: High   Morbid obesity with body mass index of 40.0-44.9 in adult [facility]) 05/05/2012    Priority: High   DVT of left distal popliteal vein 05/05/2012    Priority: High   Hyperlipidemia associated with type 2 diabetes mellitus (HCC) 03/25/2022    Priority: Medium    Acid indigestion 08/09/2021    Priority: Medium    History of small bowel obstruction 08/20/2018    Priority: Medium    History of  diabetic ulcer of foot 03/21/2018    Priority: Medium    S/P repair of recurrent ventral hernia 04/26/2017    Priority: Medium    Chronic venous insufficiency 01/25/2015    Priority: Medium    Obstructive sleep apnea 05/05/2012    Priority: Medium    Eczema 12/27/2019    Priority: Low   Erectile dysfunction 12/27/2019    Priority: Low   Renal stone 05/12/2017    Priority: Low   Hyponatremia 03/23/2013    Priority: Low   Positive colorectal cancer screening using Cologuard test 08/09/2021   Hematochezia 08/09/2021   Pressure injury of skin 02/25/2021   Chronic anticoagulation 02/19/2021   Incarcerated ventral hernia 12/13/2013   Leukocytosis 03/23/2013   Past Surgical History:  Procedure Laterality Date   BOWEL RESECTION N/A 02/20/2021   Procedure: SMALL BOWEL RESECTION;  Surgeon: Rodman Pickle, MD;  Location: [facility] OR;  Service: General;  Laterality: N/A;   CORONARY STENT INTERVENTION N/A 01/02/2021   Procedure: CORONARY STENT INTERVENTION;  Surgeon: Corky Crafts, MD;  Location: [facility] INVASIVE CV LAB;  Service: Cardiovascular;  Laterality: N/A;   CYSTOSCOPY W/ URETERAL STENT PLACEMENT Right 05/12/2017   Procedure: CYSTOSCOPY WITH RETROGRADE PYELOGRAM/URETERAL STENT PLACEMENT;  Surgeon: Bjorn Pippin, MD;  Location: [facility] OR;  Service: Urology;  Laterality: Right;   CYSTOSCOPY/URETEROSCOPY/HOLMIUM LASER/STENT PLACEMENT Right 07/29/2017   Procedure: CYSTOSCOPY RIGHT URETEROSCOPY HOLMIUM LASER STENT EXCHANGE;  Surgeon:  Bjorn Pippin, MD;  Location: [facility] ORS;  Service: Urology;  Laterality: Right;   HERNIA REPAIR     2015   LAPAROTOMY N/A 12/13/2013   Procedure: EXPLORATORY LAPAROTOMY Repair ventral hernia, without mesh, partial omentectomy;  Surgeon: Cherylynn Ridges, MD;  Location: [facility] OR;  Service: General;  Laterality: N/A;   LEFT HEART CATH AND CORONARY ANGIOGRAPHY N/A 01/02/2021   Procedure: LEFT HEART CATH AND CORONARY ANGIOGRAPHY;  Surgeon: Corky Crafts, MD;  Location: [facility]  INVASIVE CV LAB;  Service: Cardiovascular;  Laterality: N/A;   LYSIS OF ADHESION  02/20/2021   Procedure: LYSIS OF ADHESION;  Surgeon: Sheliah Hatch, De Blanch, MD;  Location: [facility] OR;  Service: General;;   VENTRAL HERNIA REPAIR N/A 04/26/2017   Procedure: INCARCERATED HERNIA REPAIR VENTRAL ADULT;  Surgeon: Violeta Gelinas, MD;  Location: [facility] OR;  Service: General;  Laterality: N/A;   VENTRAL HERNIA REPAIR N/A 02/20/2021   Procedure: EXPLORATORY LAPAROTOMY; RECURRENT HERNIA REPAIR WITH OVERLAY MESH;  Surgeon: Kinsinger, De Blanch, MD;  Location: [facility] OR;  Service: General;  Laterality: N/A;    Family History  Problem Relation Age of Onset   Diabetes Mother        died age 63- also bleeding ulcer   Hypertension Mother    Heart failure Father        in his 84s (patient was 55). day before surgery planned   Other Maternal Grandmother        states natural causes all grandparents   Colon cancer Neg Hx    Esophageal cancer Neg Hx    Inflammatory bowel disease Neg Hx    Liver disease Neg Hx    Pancreatic cancer Neg Hx    Rectal cancer Neg Hx    Stomach cancer Neg Hx     Medications- reviewed and updated Current Outpatient Medications  Medication Sig Dispense Refill   ACCU-CHEK GUIDE test strip USE AS DIRECTED UP TO 4 TIMES DAILY (Patient taking differently: 1 each by Other route 4 (four) times daily.) 100 strip 3   acetaminophen (TYLENOL) 500 MG tablet Take 500-1,000 mg by mouth every 6 (six) hours as needed for mild pain or headache.     aspirin EC 81 MG tablet Take 1 tablet (81 mg total) by mouth daily. Swallow whole. 90 tablet 3   atorvastatin (LIPITOR) 80 MG tablet TAKE 1 TABLET BY MOUTH EVERY DAY 90 tablet 1   ELIQUIS 5 MG TABS tablet TAKE 1 TABLET BY MOUTH TWICE A DAY 60 tablet 5   metFORMIN (GLUCOPHAGE-XR) 500 MG 24 hr tablet TAKE 2 TABLETS (1,000 MG TOTAL) BY MOUTH IN THE MORNING AND AT BEDTIME. 360 tablet 1   metoprolol succinate (TOPROL-XL) 50 MG 24 hr tablet TAKE 1 TABLET BY MOUTH  EVERY DAY 90 tablet 3   Multiple Vitamins-Minerals (CENTRUM SILVER 50+MEN) TABS Take 1 tablet by mouth daily with breakfast.     PRESCRIPTION MEDICATION Inhale 1 Device into the lungs See admin instructions. CPAP- At bedtime every night     Semaglutide, 1 MG/DOSE, 4 MG/3ML SOPN Inject 1 mg as directed once a week. 9 mL 4   sildenafil (VIAGRA) 100 MG tablet Take 1 tablet (100 mg total) by mouth daily as needed for erectile dysfunction (do not take nitroglycerin within 24 hours of use). 10 tablet 5   nitroGLYCERIN (NITROSTAT) 0.4 MG SL tablet Place 1 tablet (0.4 mg total) under the tongue every 5 (five) minutes as needed for chest pain. 25 tablet 1   pantoprazole (PROTONIX)  40 MG tablet Take 1 tablet (40 mg total) by mouth daily. 90 tablet 3   No current facility-administered medications for this visit.    Allergies-reviewed and updated No Known Allergies  Social History   Social History Narrative   Single. Lives alone. Friends that live close. No pets.       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       Hobbies: golf, basketball, walking   Tired from work right now   Objective  Objective:  BP 124/82   Pulse 78   Temp 97.8 F (36.6 C)   Ht 6' 1.25" (1.861 m)   Wt (!) 329 lb 9.6 oz (149.5 kg)   SpO2 98%   BMI 43.19 kg/m  Gen: NAD, resting comfortably HEENT: Mucous membranes are moist. Oropharynx normal Neck: no thyromegaly CV: RRR no murmurs rubs or gallops Lungs: CTAB no crackles, wheeze, rhonchi Abdomen: soft/nontender/nondistended/normal bowel sounds. No rebound or guarding.  Ext: no edema Skin: warm, dry Neuro: grossly normal, moves all extremities, PERRLA  Diabetic Foot Exam - Simple   Simple Foot Form Diabetic Foot exam was performed with the following findings: Yes 05/26/2023  8:32 AM  Visual Inspection No deformities, no ulcerations, no other skin breakdown bilaterally: Yes Sensation Testing Intact to touch and monofilament testing bilaterally:  Yes Pulse Check Posterior Tibialis and Dorsalis pulse intact bilaterally: Yes Comments      Assessment and Plan  62 y.o. male presenting for annual physical.  Health Maintenance counseling: 1. Anticipatory guidance: Patient counseled regarding regular dental exams - advised q6 months, eye exams -yearly typically but having to switch eye doc due to insurance,  avoiding smoking and second hand smoke , limiting alcohol to 2 beverages per day - doesn't drink, no illicit drugs .   2. Risk factor reduction:  Advised patient of need for regular exercise and diet rich and fruits and vegetables to reduce risk of heart attack and stroke.  Exercise- walking daily and may start going to [employer] with nephew.  Diet/weight management-down 17 from last physical  but up 6 lbs from last visit.  Wt Readings from Last 3 Encounters:  05/26/23 (!) 329 lb 9.6 oz (149.5 kg)  01/06/23 (!) 323 lb 3.2 oz (146.6 kg)  07/29/22 (!) 341 lb (154.7 kg)  3. Immunizations/screenings/ancillary studies- up to date - flu shot today  Immunization History  Administered Date(s) Administered   Influenza Split 05/27/2012   Influenza, Seasonal, Injecte, Preservative Fre 05/26/2023   Influenza,inj,Quad PF,6+ Mos 02/15/2013, 03/01/2014, 06/19/2015, 04/16/2016, 04/29/2017, 02/09/2018, 08/21/2018, 03/22/2021, 03/25/2022   PFIZER(Purple Top)SARS-COV-2 Vaccination 10/24/2019   PNEUMOCOCCAL CONJUGATE-20 03/22/2021   Pneumococcal Polysaccharide-23 03/31/2013, 06/19/2015   Tdap 05/09/2016   Zoster Recombinant(Shingrix) 06/05/2021, 11/23/2021  4. Prostate cancer screening- low risk prior trend- update PSA today   Lab Results  Component Value Date   PSA 0.84 11/23/2021   PSA 1.1 12/27/2019  5. Colon cancer screening - 09/13/21 and told 7 year follow up due to polyp history 6. Skin cancer screening- no dermatologist. advised regular sunscreen use. Denies worrisome, changing, or new skin lesions.  7. Smoking associated screening  (lung cancer screening, AAA screen 65-75, UA)- never smoker 8. STD screening - states always using protection if active  Status of chronic or acute concerns   #CAD-NSTEMI in August 2022 secondary to severe mid RCA stenosis. This was treated with a drug eluting stent. LV function normal by echo. No chest pain. Plavix until August 2023 at--> now on ASA only, He  is also on Eliquis.  #hyperlipidemia S: Medication:Atorvastatin 80 mg, eliquis 5 mg twice daily + asa 81 mg- Nitroglycerin on hand - not needing- knows not to take within 24 hours of viagra -Also on metoprolol 50 mg extended release  A/P: coronary artery disease asymptomatic continue current medications   #Chronic pulmonary embolism/DVT S: Medication: Eliquis 5 mg twice daily due to history of recurrent DVT A/P: no evidence of new deep vein thrombosis/pulmonary embolism- continue current medications    # Diabetes S: Medication:, metformin 1000 mg twice daily XR, ozempic retrial 02/2022 0.5 mg -Significant nausea issues in the past on Ozempic - UTIs with Jardiance unfortunately CBGs- had issue this morning with meter - 115-120 usually Lab Results  Component Value Date   HGBA1C 7.3 (H) 01/06/2023   HGBA1C 10.7 (H) 07/29/2022   HGBA1C 10.0 (H) 03/25/2022   A/P: hopefully stable or improved- update a1c today. Continue current meds for now  other than increase to 1 mg of Ozempic as long as he tolerateds that  # GERD and has been told hiatal hernia S:Medication: Pantoprazole 40 mg- not needing much at all A/P: doing well lately- continue to monitor - check B12 with pantoprazole and metfromin and being off b12   #OSA-compliant with CPAP- set up years ago- if we cannot find cause of fatigue may be worth updated sleep evaluation   #History of bowel obstruction-hospitalized September through October 2022 with high-grade small bowel obstruction related to large and complex multilocular ventral hernia - no recent abdominal issues   #has  had a few urinary tract infection this year- will keep eye on this and consider urology. Also with chills wonder about prostatitis especially if recurs again  Recommended follow up: Return in about 4 months (around 09/24/2023) for followup or sooner if needed.Schedule b4 you leave. Future Appointments  Date Time Provider Department Center  07/28/2023  9:00 AM Kathleene Hazel, MD [facility]   Lab/Order associations: fasting   ICD-10-CM   1. Preventative health care  Z00.00     2. Type 2 diabetes mellitus with hyperglycemia, without long-term current use of insulin (HCC)  E11.65 Comprehensive metabolic panel    CBC with Differential/Platelet    Lipid panel    Hemoglobin A1c    3. Screening for prostate cancer  Z12.5 PSA    4. Coronary artery disease involving native coronary artery of native heart without angina pectoris  I25.10     5. Hyperlipidemia associated with type 2 diabetes mellitus (HCC)  E11.69 Lipid panel   E78.5 TSH    6. Need for influenza vaccination  Z23 Flu vaccine trivalent PF, 6mos and older(Flulaval,Afluria,Fluarix,Fluzone)    7. Fatigue, unspecified type  R53.83 TSH    VITAMIN D 25 Hydroxy (Vit-D Deficiency, Fractures)    8. High risk medication use  Z79.899 Vitamin B12      Meds ordered this encounter  Medications   Semaglutide, 1 MG/DOSE, 4 MG/3ML SOPN    Sig: Inject 1 mg as directed once a week.    Dispense:  9 mL    Refill:  4   nitroGLYCERIN (NITROSTAT) 0.4 MG SL tablet    Sig: Place 1 tablet (0.4 mg total) under the tongue every 5 (five) minutes as needed for chest pain.    Dispense:  25 tablet    Refill:  1   pantoprazole (PROTONIX) 40 MG tablet    Sig: Take 1 tablet (40 mg total) by mouth daily.    Dispense:  90 tablet  Refill:  3    Return precautions advised.  Tana Conch, MD

## 2023-06-18 ENCOUNTER — Telehealth: Payer: Self-pay | Admitting: Family Medicine

## 2023-06-18 ENCOUNTER — Other Ambulatory Visit: Payer: Self-pay | Admitting: Family Medicine

## 2023-06-18 NOTE — Telephone Encounter (Signed)
Copied from CRM [id number]. Topic: Clinical - Prescription Issue >> Jun 18, 2023  3:24 PM [name] wrote: Reason for CRM: Patient is requiring new order for ACCU-CHEK GUIDE test strip. He is [facility]. CB# [phone] or send message to MyChart if an issue.

## 2023-06-18 NOTE — Telephone Encounter (Unsigned)
Copied from CRM [id number]. Topic: Clinical - Medication Question >> Jun 18, 2023  3:29 PM [name] wrote: Reason for CRM: Patient's dosage of Ozempic was recently changed to Semaglutide, 1 MG/DOSE, 4 MG/3ML SOPN. He reports he will not have the new dosage of medication for a few more days. He would like to know if he should continue the 0.5 mg dose until he receives the new medication, or if he can do 2 x of the 0.5 mg doses instead. CB# [phone]

## 2023-06-18 NOTE — Telephone Encounter (Signed)
Copied from CRM [id number]. Topic: Clinical - Medication Refill >> Jun 18, 2023  3:16 PM [name] wrote: Most Recent Primary Care Visit:  Provider: Shelva Majestic  Department: [facility]  Visit Type: PHYSICAL  Date: 05/26/2023  Medication: ACCU-CHEK GUIDE test strip  Has the patient contacted their pharmacy? Yes (Agent: If no, request that the patient contact the pharmacy for the refill. If patient does not wish to contact the pharmacy document the reason why and proceed with request.) (Agent: If yes, when and what did the pharmacy advise?)  Is this the correct pharmacy for this prescription? Yes If no, delete pharmacy and type the correct one.  This is the patient's preferred pharmacy:   [facility] [address], [place] - [address] [place] [address] Phone: [phone] Fax: [phone]   Has the prescription been filled recently? Yes  Is the patient out of the medication? No  Has the patient been seen for an appointment in the last year OR does the patient have an upcoming appointment? Yes  Can we respond through MyChart? Yes  Agent: Please be advised that Rx refills may take up to 3 business days. We ask that you follow-up with your pharmacy.

## 2023-06-19 NOTE — Telephone Encounter (Signed)
See below

## 2023-06-19 NOTE — Telephone Encounter (Signed)
Lets just have him wait until he gets the 1 mg dose and continue 0.5 mg for now

## 2023-06-19 NOTE — Telephone Encounter (Signed)
 Called and left below message on pt vm.

## 2023-07-27 NOTE — Progress Notes (Unsigned)
 No chief complaint on file.  History of Present Illness: 63 yo male with history of  CAD, diabetes mellitus, HTN, hyperlipidemia, morbid obesity, history of DVT and PE in 2013 and sleep apnea who is here today for cardiac follow up. He was admitted to [facility] 12/31/20 with a NSTEMI. Cardiac cath 01/02/21 with a severe mid RCA stenosis treated with a drug eluting stent. Also with septal branch occlusion. Echo 01/01/21 with LVEF=55-60%. No valve disease.   He is here today for follow up. The patient denies any chest pain, dyspnea, palpitations, lower extremity edema, orthopnea, PND, dizziness, near syncope or syncope.   Primary Care Physician: Shelva Majestic, MD  Past Medical History:  Diagnosis Date   CAD (coronary artery disease)    Diabetes mellitus, type II (HCC) 02/2013   Diabetic ulcer of right foot due to type 2 diabetes mellitus (HCC) 03/21/2018   DVT (deep venous thrombosis) (HCC) 05/04/2012   LLE   Exertional dyspnea 05/04/2012   "isolated episode" (05/05/2012)   Hepatic steatosis    History of chickenpox    Hypertension 03/21/2018   Obesity, Class III, BMI 40-49.9 (morbid obesity) (HCC) 03/21/2018   OSA on CPAP    Peripheral vascular disease (HCC)    varicose veins   Pulmonary embolism (HCC) 05/04/2012   bilaterally   Pyelonephritis 04/2017   Small bowel obstruction (HCC) 09/06/2015   Varicose veins     Past Surgical History:  Procedure Laterality Date   BOWEL RESECTION N/A 02/20/2021   Procedure: SMALL BOWEL RESECTION;  Surgeon: Rodman Pickle, MD;  Location: [facility] OR;  Service: General;  Laterality: N/A;   CORONARY STENT INTERVENTION N/A 01/02/2021   Procedure: CORONARY STENT INTERVENTION;  Surgeon: Corky Crafts, MD;  Location: [facility] INVASIVE CV LAB;  Service: Cardiovascular;  Laterality: N/A;   CYSTOSCOPY W/ URETERAL STENT PLACEMENT Right 05/12/2017   Procedure: CYSTOSCOPY WITH RETROGRADE PYELOGRAM/URETERAL STENT PLACEMENT;  Surgeon: Bjorn Pippin, MD;  Location:  [facility] OR;  Service: Urology;  Laterality: Right;   CYSTOSCOPY/URETEROSCOPY/HOLMIUM LASER/STENT PLACEMENT Right 07/29/2017   Procedure: CYSTOSCOPY RIGHT URETEROSCOPY HOLMIUM LASER STENT EXCHANGE;  Surgeon: Bjorn Pippin, MD;  Location: [facility] ORS;  Service: Urology;  Laterality: Right;   HERNIA REPAIR     2015   LAPAROTOMY N/A 12/13/2013   Procedure: EXPLORATORY LAPAROTOMY Repair ventral hernia, without mesh, partial omentectomy;  Surgeon: Cherylynn Ridges, MD;  Location: [facility] OR;  Service: General;  Laterality: N/A;   LEFT HEART CATH AND CORONARY ANGIOGRAPHY N/A 01/02/2021   Procedure: LEFT HEART CATH AND CORONARY ANGIOGRAPHY;  Surgeon: Corky Crafts, MD;  Location: [facility] INVASIVE CV LAB;  Service: Cardiovascular;  Laterality: N/A;   LYSIS OF ADHESION  02/20/2021   Procedure: LYSIS OF ADHESION;  Surgeon: Sheliah Hatch, De Blanch, MD;  Location: [facility] OR;  Service: General;;   VENTRAL HERNIA REPAIR N/A 04/26/2017   Procedure: INCARCERATED HERNIA REPAIR VENTRAL ADULT;  Surgeon: Violeta Gelinas, MD;  Location: [facility] OR;  Service: General;  Laterality: N/A;   VENTRAL HERNIA REPAIR N/A 02/20/2021   Procedure: EXPLORATORY LAPAROTOMY; RECURRENT HERNIA REPAIR WITH OVERLAY MESH;  Surgeon: Kinsinger, De Blanch, MD;  Location: [facility] OR;  Service: General;  Laterality: N/A;    Current Outpatient Medications  Medication Sig Dispense Refill   ACCU-CHEK GUIDE test strip USE AS DIRECTED UP TO 4 TIMES DAILY (Patient taking differently: 1 each by Other route 4 (four) times daily.) 100 strip 3   acetaminophen (TYLENOL) 500 MG tablet Take 500-1,000 mg by mouth every 6 (  six) hours as needed for mild pain or headache.     aspirin EC 81 MG tablet Take 1 tablet (81 mg total) by mouth daily. Swallow whole. 90 tablet 3   atorvastatin (LIPITOR) 80 MG tablet TAKE 1 TABLET BY MOUTH EVERY DAY 90 tablet 1   ELIQUIS 5 MG TABS tablet TAKE 1 TABLET BY MOUTH TWICE A DAY 60 tablet 5   metFORMIN (GLUCOPHAGE-XR) 500 MG 24 hr tablet TAKE 2 TABLETS (1,000 MG  TOTAL) BY MOUTH IN THE MORNING AND AT BEDTIME. 360 tablet 1   metoprolol succinate (TOPROL-XL) 50 MG 24 hr tablet TAKE 1 TABLET BY MOUTH EVERY DAY 90 tablet 3   Multiple Vitamins-Minerals (CENTRUM SILVER 50+MEN) TABS Take 1 tablet by mouth daily with breakfast.     nitroGLYCERIN (NITROSTAT) 0.4 MG SL tablet Place 1 tablet (0.4 mg total) under the tongue every 5 (five) minutes as needed for chest pain. 25 tablet 1   pantoprazole (PROTONIX) 40 MG tablet Take 1 tablet (40 mg total) by mouth daily. 90 tablet 3   PRESCRIPTION MEDICATION Inhale 1 Device into the lungs See admin instructions. CPAP- At bedtime every night     Semaglutide, 1 MG/DOSE, 4 MG/3ML SOPN Inject 1 mg as directed once a week. 9 mL 4   sildenafil (VIAGRA) 100 MG tablet Take 1 tablet (100 mg total) by mouth daily as needed for erectile dysfunction (do not take nitroglycerin within 24 hours of use). 10 tablet 5   Vitamin D, Ergocalciferol, (DRISDOL) 1.25 MG (50000 UNIT) CAPS capsule Take 1 capsule (50,000 Units total) by mouth every 7 (seven) days. 12 capsule 0   No current facility-administered medications for this visit.    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 Lives alone. Friends that live close. No pets.       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       Hobbies: golf, basketball, walking    Tired from work right now   Social Drivers of Corporate investment banker Strain: Not on file  Food Insecurity: Not on file  Transportation Needs: Not on file  Physical Activity: Not on file  Stress: Not on file  Social Connections: Not on file  Intimate Partner Violence: Not on file    Family History  Problem Relation Age of Onset   Diabetes Mother        died age 94- also bleeding ulcer   Hypertension Mother    Heart failure Father        in his 47s (patient was 41). day before surgery planned   Other Maternal Grandmother        states natural causes all grandparents   Colon cancer Neg Hx    Esophageal cancer Neg Hx  Inflammatory bowel disease Neg Hx    Liver disease Neg Hx    Pancreatic cancer Neg Hx    Rectal cancer Neg Hx    Stomach cancer Neg Hx     Review of Systems:  As stated in the HPI and otherwise negative.   There were no vitals taken for this visit.  Physical Examination: General: Well developed, well nourished, NAD  HEENT: OP clear, mucus membranes moist  SKIN: warm, dry. No rashes. Neuro: No focal deficits  Musculoskeletal: Muscle strength 5/5 all ext  Psychiatric: Mood and affect normal  Neck: No JVD, no carotid bruits, no thyromegaly, no lymphadenopathy.  Lungs:Clear bilaterally, no wheezes, rhonci, crackles Cardiovascular: Regular rate and rhythm. No murmurs, gallops or rubs. Abdomen:Soft. Bowel sounds present. Non-tender.  Extremities: No lower extremity edema. Pulses are 2 + in the bilateral DP/PT.  EKG:  EKG is *** ordered today. The ekg ordered today demonstrates   Echo 01/01/21:  1. Left ventricular ejection fraction, by estimation, is 55 to 60%. The  left ventricle has normal function. The left ventricle has no regional  wall motion abnormalities. Left ventricular diastolic parameters are  consistent with Grade I diastolic  dysfunction (impaired relaxation).   2. Right ventricular systolic function is normal. The right ventricular   size is normal.   3. The mitral valve is normal in structure. No evidence of mitral valve  regurgitation. No evidence of mitral stenosis.   4. The aortic valve is tricuspid. Aortic valve regurgitation is not  visualized. No aortic stenosis is present.   5. The inferior vena cava is normal in size with greater than 50%  respiratory variability, suggesting right atrial pressure of 3 mmHg.   Cardiac cath 01/02/21:   2nd Sept lesion is 100% stenosed.  Right to left collaterals.   Mid RCA lesion is 95% stenosed.  A drug-eluting stent was successfully placed using a STENT ONYX FRONTIER 4.0X26, postdilated to 4.5 mm.   A drug-eluting stent was successfully placed using a STENT ONYX FRONTIER 4.0X26.   Post intervention, there is a 0% residual stenosis.   The left ventricular systolic function is normal.   LV end diastolic pressure is normal.   The left ventricular ejection fraction is 55-65% by visual estimate.   There is no aortic valve stenosis.  Recent Labs: 05/26/2023: ALT 26; BUN 19; Creatinine, Ser 1.02; Hemoglobin 13.1; Platelets 187.0; Potassium 4.6; Sodium 140; TSH 0.60   Lipid Panel    Component Value Date/Time   CHOL 124 05/26/2023 0851   CHOL 122 03/29/2021 0812   TRIG 123.0 05/26/2023 0851   HDL 40.40 05/26/2023 0851   HDL 31 (L) 03/29/2021 0812   CHOLHDL 3 05/26/2023 0851   VLDL 24.6 05/26/2023 0851   LDLCALC 59 05/26/2023 0851   LDLCALC 69 03/29/2021 0812   LDLCALC 131 (H) 12/27/2019 1457   LDLDIRECT 67.0 03/25/2022 1429     Wt Readings from Last 3 Encounters:  05/26/23 (!) 149.5 kg  01/06/23 (!) 146.6 kg  07/29/22 (!) 154.7 kg     Assessment and Plan:   1. CAD without angina: NSTEMI in August 2022 secondary to severe mid RCA stenosis. This was treated with a drug eluting stent. LV function normal by echo. No chest pain. Continue ASA, statin and beta blocker. He is also on Eliquis   2. HTN: BP is well controlled. No changes today  3. Hyperlipidemia: LDL ***.  Continue statin.   4. 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  Disposition:   F/U with me in 12 months.   Signed, Verne Carrow, MD 07/27/2023 4:27 PM    [facility] [address] [place], [place], [place]  [address] Phone: [phone]; Fax: [phone]

## 2023-07-28 ENCOUNTER — Encounter: Payer: Self-pay | Admitting: Cardiovascular Disease

## 2023-07-28 ENCOUNTER — Ambulatory Visit: Payer: PRIVATE HEALTH INSURANCE | Attending: Cardiovascular Disease | Admitting: Cardiovascular Disease

## 2023-07-28 VITALS — BP 116/74 | HR 84 | Ht 73.25 in | Wt 341.2 lb

## 2023-07-28 DIAGNOSIS — E7800 Pure hypercholesterolemia, unspecified: Secondary | ICD-10-CM | POA: Diagnosis not present

## 2023-07-28 DIAGNOSIS — I2510 Atherosclerotic heart disease of native coronary artery without angina pectoris: Secondary | ICD-10-CM | POA: Diagnosis not present

## 2023-07-28 DIAGNOSIS — I10 Essential (primary) hypertension: Secondary | ICD-10-CM

## 2023-07-28 NOTE — Patient Instructions (Signed)

## 2023-09-04 ENCOUNTER — Other Ambulatory Visit: Payer: Self-pay

## 2023-09-04 ENCOUNTER — Emergency Department (HOSPITAL_COMMUNITY)
Admission: EM | Admit: 2023-09-04 | Discharge: 2023-09-04 | Disposition: A | Discharge status: Home / Self Care | Payer: PRIVATE HEALTH INSURANCE | Attending: Emergency Medicine | Admitting: Emergency Medicine

## 2023-09-04 ENCOUNTER — Emergency Department (HOSPITAL_COMMUNITY): Payer: PRIVATE HEALTH INSURANCE

## 2023-09-04 DIAGNOSIS — R079 Chest pain, unspecified: Secondary | ICD-10-CM | POA: Diagnosis present

## 2023-09-04 DIAGNOSIS — Z7982 Long term (current) use of aspirin: Secondary | ICD-10-CM | POA: Diagnosis not present

## 2023-09-04 DIAGNOSIS — R0789 Other chest pain: Secondary | ICD-10-CM | POA: Diagnosis present

## 2023-09-04 DIAGNOSIS — R0602 Shortness of breath: Secondary | ICD-10-CM | POA: Insufficient documentation

## 2023-09-04 DIAGNOSIS — I10 Essential (primary) hypertension: Secondary | ICD-10-CM | POA: Diagnosis not present

## 2023-09-04 DIAGNOSIS — I2510 Atherosclerotic heart disease of native coronary artery without angina pectoris: Secondary | ICD-10-CM | POA: Diagnosis not present

## 2023-09-04 DIAGNOSIS — E785 Hyperlipidemia, unspecified: Secondary | ICD-10-CM

## 2023-09-04 LAB — CBC
HCT: 43.3 % (ref 39.0–52.0)
Hemoglobin: 13.8 g/dL (ref 13.0–17.0)
MCH: 27.9 pg (ref 26.0–34.0)
MCHC: 31.9 g/dL (ref 30.0–36.0)
MCV: 87.7 fL (ref 80.0–100.0)
Platelets: 178 10*3/uL (ref 150–400)
RBC: 4.94 MIL/uL (ref 4.22–5.81)
RDW: 14.9 % (ref 11.5–15.5)
WBC: 8.3 10*3/uL (ref 4.0–10.5)
nRBC: 0 % (ref 0.0–0.2)

## 2023-09-04 LAB — BASIC METABOLIC PANEL WITH GFR
Anion gap: 12 (ref 5–15)
BUN: 12 mg/dL (ref 8–23)
CO2: 25 mmol/L (ref 22–32)
Calcium: 9.4 mg/dL (ref 8.9–10.3)
Chloride: 99 mmol/L (ref 98–111)
Creatinine, Ser: 1.08 mg/dL (ref 0.61–1.24)
GFR, Estimated: >60 mL/min (ref >60)
Glucose, Bld: 268 mg/dL — ABNORMAL HIGH (ref 70–99)
Potassium: 5 mmol/L (ref 3.5–5.1)
Sodium: 136 mmol/L (ref 135–145)

## 2023-09-04 LAB — TROPONIN I (HIGH SENSITIVITY)
Troponin I (High Sensitivity): 3 ng/L (ref <18)
Troponin I (High Sensitivity): 3 ng/L (ref <18)

## 2023-09-04 LAB — PROTIME-INR
INR: 1.2 (ref 0.8–1.2)
Prothrombin Time: 15.3 s — ABNORMAL HIGH (ref 11.4–15.2)

## 2023-09-04 LAB — BRAIN NATRIURETIC PEPTIDE: B Natriuretic Peptide: 13.5 pg/mL (ref 0.0–100.0)

## 2023-09-04 NOTE — Consult Note (Addendum)
 Cardiology Consultation   Patient ID: Evan Leonard MRN: [id number]; DOB: Apr 17, 1961  Admit date: 09/04/2023 Date of Consult: 09/04/2023  PCP:  Shelva Majestic, MD   [facility] Providers Cardiologist:  Verne Carrow, MD        Patient Profile:   Evan Leonard is a 63 y.o. 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 Rodena Medin.  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  Patient had NSTEMI in August 2022.  Echocardiogram obtained on 01/01/2021 showed EF 55 to 60%, grade 1 DD, no significant valve issue.  Cardiac catheterization performed 02/02/2021 showed 100% occluded second septal perforator with left-to-right collaterals, 95% mid RCA lesion treated with Onyx frontier 4.0 x 26 mm DES.  No other residual disease was seen.  Postprocedure, patient was placed on aspirin, Eliquis and Plavix.  Aspirin was discontinued after the first month.  Plavix was discontinued after 12 months.  Aspirin was later restarted.  He is on metoprolol succinate 50 mg daily and Lipitor 80 mg daily at home.  Last lipid panel obtained in December 2024 showed a total cholesterol 124, HDL 40, LDL 59, triglyceride 123.  He was recently seen by Dr. Clifton James on 07/28/2023 at which time he was doing well without any chest pain or shortness of breath.  He works in [employer] for 8 to 9 hours a day and has to carry heavy stuff around.  He denies any recent chest pain or shortness of breath.  He was in his usual state of health until this morning.  He woke up feeling fine around 4 AM as usual.  Shortly afterward, he started noticing he was getting increased shortness of breath while walking around and getting dressed.  He  also had vague chest pressure sensation in the substernal area and also in the right shoulder blade.  This prompted the patient to seek urgent medical attention at [facility], ED.  Interestingly, he never had chest pain or shoulder pain with the previous MI in 2022, previous anginal symptom was more shortness of breath and fatigue.  His chest pressure was intermittent and occurs about 10 to 15 minutes each time.  At the time of examination, he was not short of breath or having chest discomfort.  He says he has been very much compliant with his medication and has not missed a single dose of Eliquis recently.  He did not take any medications this morning as he came to the emergency room, last dose of Eliquis was last night.  Basic metabolic panel and CBC were normal except for elevated blood glucose of 268.  Serial troponin was negative x 2.  Chest x-ray normal.  BNP normal.  EKG showed normal sinus rhythm, no significant ST-T wave changes.  Cardiology service consulted for chest discomfort.    Past Medical History:  Diagnosis Date   CAD (coronary artery disease)    Diabetes mellitus, type II (HCC) 02/2013   Diabetic ulcer of right foot due to type 2 diabetes mellitus (HCC) 03/21/2018   DVT (deep venous thrombosis) (HCC) 05/04/2012   LLE   Exertional dyspnea 05/04/2012   "isolated episode" (05/05/2012)   Hepatic steatosis  History of chickenpox    Hypertension 03/21/2018   Obesity, Class III, BMI 40-49.9 (morbid obesity) (HCC) 03/21/2018   OSA on CPAP    Peripheral vascular disease (HCC)    varicose veins   Pulmonary embolism (HCC) 05/04/2012   bilaterally   Pyelonephritis 04/2017   Small bowel obstruction (HCC) 09/06/2015   Varicose veins     Past Surgical History:  Procedure Laterality Date   BOWEL RESECTION N/A 02/20/2021   Procedure: SMALL BOWEL RESECTION;  Surgeon: Sheliah Hatch De Blanch, MD;  Location: [facility] OR;  Service: General;  Laterality: N/A;   CORONARY STENT INTERVENTION N/A  01/02/2021   Procedure: CORONARY STENT INTERVENTION;  Surgeon: Corky Crafts, MD;  Location: [facility] INVASIVE CV LAB;  Service: Cardiovascular;  Laterality: N/A;   CYSTOSCOPY W/ URETERAL STENT PLACEMENT Right 05/12/2017   Procedure: CYSTOSCOPY WITH RETROGRADE PYELOGRAM/URETERAL STENT PLACEMENT;  Surgeon: Bjorn Pippin, MD;  Location: [facility] OR;  Service: Urology;  Laterality: Right;   CYSTOSCOPY/URETEROSCOPY/HOLMIUM LASER/STENT PLACEMENT Right 07/29/2017   Procedure: CYSTOSCOPY RIGHT URETEROSCOPY HOLMIUM LASER STENT EXCHANGE;  Surgeon: Bjorn Pippin, MD;  Location: [facility] ORS;  Service: Urology;  Laterality: Right;   HERNIA REPAIR     2015   LAPAROTOMY N/A 12/13/2013   Procedure: EXPLORATORY LAPAROTOMY Repair ventral hernia, without mesh, partial omentectomy;  Surgeon: Cherylynn Ridges, MD;  Location: [facility] OR;  Service: General;  Laterality: N/A;   LEFT HEART CATH AND CORONARY ANGIOGRAPHY N/A 01/02/2021   Procedure: LEFT HEART CATH AND CORONARY ANGIOGRAPHY;  Surgeon: Corky Crafts, MD;  Location: [facility] INVASIVE CV LAB;  Service: Cardiovascular;  Laterality: N/A;   LYSIS OF ADHESION  02/20/2021   Procedure: LYSIS OF ADHESION;  Surgeon: Sheliah Hatch, De Blanch, MD;  Location: [facility] OR;  Service: General;;   VENTRAL HERNIA REPAIR N/A 04/26/2017   Procedure: INCARCERATED HERNIA REPAIR VENTRAL ADULT;  Surgeon: Violeta Gelinas, MD;  Location: [facility] OR;  Service: General;  Laterality: N/A;   VENTRAL HERNIA REPAIR N/A 02/20/2021   Procedure: EXPLORATORY LAPAROTOMY; RECURRENT HERNIA REPAIR WITH OVERLAY MESH;  Surgeon: Kinsinger, De Blanch, MD;  Location: [facility] OR;  Service: General;  Laterality: N/A;     Home Medications:  Prior to Admission medications   Medication Sig Start Date End Date Taking? Authorizing Provider  ACCU-CHEK GUIDE test strip USE AS DIRECTED UP TO 4 TIMES DAILY Patient taking differently: 1 each by Other route 4 (four) times daily. 12/30/19   Shelva Majestic, MD  acetaminophen (TYLENOL) 500 MG tablet Take  500-1,000 mg by mouth every 6 (six) hours as needed for mild pain or headache.    [provider]  aspirin EC 81 MG tablet Take 1 tablet (81 mg total) by mouth daily. Swallow whole. 02/18/22   Kathleene Hazel, MD  atorvastatin (LIPITOR) 80 MG tablet TAKE 1 TABLET BY MOUTH EVERY DAY 06/18/23   Shelva Majestic, MD  ELIQUIS 5 MG TABS tablet TAKE 1 TABLET BY MOUTH TWICE A DAY 04/14/23   Shelva Majestic, MD  metFORMIN (GLUCOPHAGE-XR) 500 MG 24 hr tablet TAKE 2 TABLETS (1,000 MG TOTAL) BY MOUTH IN THE MORNING AND AT BEDTIME. 06/18/23   Shelva Majestic, MD  metoprolol succinate (TOPROL-XL) 50 MG 24 hr tablet TAKE 1 TABLET BY MOUTH EVERY DAY 12/23/22   Shelva Majestic, MD  Multiple Vitamins-Minerals (CENTRUM SILVER 50+MEN) TABS Take 1 tablet by mouth daily with breakfast.    [provider]  nitroGLYCERIN (NITROSTAT) 0.4 MG SL tablet Place 1 tablet (0.4 mg total) under  the tongue every 5 (five) minutes as needed for chest pain. 05/26/23   Shelva Majestic, MD  pantoprazole (PROTONIX) 40 MG tablet Take 1 tablet (40 mg total) by mouth daily. 05/26/23   Shelva Majestic, MD  PRESCRIPTION MEDICATION Inhale 1 Device into the lungs See admin instructions. CPAP- At bedtime every night    [provider]  Semaglutide, 1 MG/DOSE, 4 MG/3ML SOPN Inject 1 mg as directed once a week. 05/26/23   Shelva Majestic, MD  sildenafil (VIAGRA) 100 MG tablet Take 1 tablet (100 mg total) by mouth daily as needed for erectile dysfunction (do not take nitroglycerin within 24 hours of use). 01/06/23   Shelva Majestic, MD  Vitamin D, Ergocalciferol, (DRISDOL) 1.25 MG (50000 UNIT) CAPS capsule Take 1 capsule (50,000 Units total) by mouth every 7 (seven) days. 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 Lives alone. Friends that live close. No pets.       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       Hobbies: golf, basketball, walking   Tired from work right now   Social Drivers of Corporate investment banker Strain: Not on file  Food Insecurity: Not on file  Transportation Needs: Not on file  Physical Activity: Not on file  Stress: Not on file  Social Connections: Not on file  Intimate Partner Violence: Not on file    Family History:    Family History  Problem Relation Age of Onset   Diabetes Mother        died age 27- also bleeding ulcer   Hypertension Mother    Heart failure Father        in his 61s (patient was 90). day before surgery planned   Other Maternal Grandmother        states natural causes all grandparents   Colon cancer Neg Hx    Esophageal cancer Neg Hx    Inflammatory bowel disease Neg Hx    Liver disease Neg Hx    Pancreatic cancer Neg Hx    Rectal cancer Neg Hx    Stomach cancer Neg Hx      ROS:  Please see the history of present illness.   All other ROS reviewed and negative.     Physical Exam/Data:   Vitals:   09/04/23 1000 09/04/23 1015 09/04/23 1030 09/04/23 1100  BP: 119/70 111/71 98/68   Pulse: 69 71  79  Resp:   18   Temp:      TempSrc:      SpO2: 100% 100% 100% 100%  No intake or output data in the 24 hours ending 09/04/23 1114    07/28/2023    8:39  AM 05/26/2023    8:06 AM 01/06/2023    2:02 PM  Last 3 Weights  Weight (lbs) 341 lb 3.2 oz 329 lb 9.6 oz 323 lb 3.2 oz  Weight (kg) 154.767 kg 149.506 kg 146.603 kg     There is no height or weight on file to calculate BMI.  General:  Well nourished, well developed, in no acute distress HEENT: normal Neck: no JVD Vascular: No carotid bruits; Distal pulses 2+ bilaterally Cardiac:  normal S1, S2; RRR; no murmur  Lungs:  clear to auscultation bilaterally, no wheezing, rhonchi or rales  Abd: soft, nontender, no hepatomegaly  Ext: no edema Musculoskeletal:  No deformities, BUE and BLE strength normal and equal Skin: warm and dry  Neuro:  CNs 2-12 intact, no focal abnormalities noted Psych:  Normal affect   EKG:  The EKG was personally reviewed and demonstrates: Normal sinus rhythm, no significant ST-T wave changes Telemetry:  Telemetry was personally reviewed and demonstrates: Sinus rhythm, no significant ventricular ectopy.  Patient was only intermittently on telemetry, currently telemetry is off.  Relevant CV Studies:  Echo 01/01/2021  1. Left ventricular ejection fraction, by estimation, is 55 to 60%. The  left ventricle has normal function. The left ventricle has no regional  wall motion abnormalities. Left ventricular diastolic parameters are  consistent with Grade I diastolic  dysfunction (impaired relaxation).   2. Right ventricular systolic function is normal. The right ventricular  size is normal.   3. The mitral valve is normal in structure. No evidence of mitral valve  regurgitation. No evidence of mitral stenosis.   4. The aortic valve is tricuspid. Aortic valve regurgitation is not  visualized. No aortic stenosis is present.   5. The inferior vena cava is normal in size with greater than 50%  respiratory variability, suggesting right atrial pressure of 3 mmHg.    Cath 01/02/2021   2nd Sept lesion is 100% stenosed.  Right to left collaterals.   Mid RCA lesion is 95%  stenosed.  A drug-eluting stent was successfully placed using a STENT ONYX FRONTIER 4.0X26, postdilated to 4.5 mm.   A drug-eluting stent was successfully placed using a STENT ONYX FRONTIER 4.0X26.   Post intervention, there is a 0% residual stenosis.   The left ventricular systolic function is normal.   LV end diastolic pressure is normal.   The left ventricular ejection fraction is 55-65% by visual estimate.   There is no aortic valve stenosis.   He will need aggressive secondary prevention including diabetes control, weight loss and a healthy, plant-based diet.   Restart Eliquis tomorrow.  Aspirin for 1 month starting today.  Clopidogrel for 12 months starting today.  Cardiac rehab may be beneficial as well.  Laboratory Data:  High Sensitivity Troponin:   Recent Labs  Lab 09/04/23 0659 09/04/23 0854  TROPONINIHS 3 3     Chemistry Recent Labs  Lab 09/04/23 0659  NA 136  K 5.0  CL 99  CO2 25  GLUCOSE 268*  BUN 12  CREATININE 1.08  CALCIUM 9.4  GFRNONAA >60  ANIONGAP 12    No results for input(s): "PROT", "ALBUMIN", "AST", "ALT", "ALKPHOS", "BILITOT" in the last 168 hours. Lipids No results for input(s): "CHOL", "TRIG", "HDL", "LABVLDL", "LDLCALC", "CHOLHDL" in the last 168 hours.  Hematology Recent Labs  Lab 09/04/23 0659  WBC 8.3  RBC 4.94  HGB  13.8  HCT 43.3  MCV 87.7  MCH 27.9  MCHC 31.9  RDW 14.9  PLT 178   Thyroid No results for input(s): "TSH", "FREET4" in the last 168 hours.  BNP Recent Labs  Lab 09/04/23 0854  BNP 13.5    DDimer No results for input(s): "DDIMER" in the last 168 hours.   Radiology/Studies:  DG Chest 2 View Result Date: 09/04/2023 CLINICAL DATA:  Shortness of breath. EXAM: CHEST - 2 VIEW COMPARISON:  CT angio chest from 01/01/2021 FINDINGS: The heart size and mediastinal contours are within normal limits. No pleural effusion, interstitial edema or consolidative change. Scar noted within the left base. The visualized skeletal  structures are unremarkable. IMPRESSION: No active cardiopulmonary disease. Electronically Signed   By: Signa Kell M.D.   On: 09/04/2023 07:50     Assessment and Plan:   Chest pain and shortness of breath  - Previous anginal symptom was shortness of breath and fatigue, he never had chest pain with the previous MI.  -He was in his usual state of health until this morning when he started having worsening shortness of breath and intermittent chest pressure and left right shoulder pressure.  Symptom has since resolved.  EKG showed no acute finding.  Chest x-ray normal.  Suspicion for PE is fairly low as the patient is adamant that he has been compliant with twice a day dosing of Eliquis recently.  - Serial troponin negative x 2.  -Will discuss with MD, overall symptom atypical, patient has been ruled out.   CAD: Last MI was seen 2022, patient had occluded D2 with distal collaterals, 95% mid RCA lesion treated with a large 4.0 mm DES.  No other residual disease was seen.  Hypertension: Last dose of metoprolol succinate was last night.  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

## 2023-09-04 NOTE — ED Triage Notes (Addendum)
 Patient reports SOB with chest heaviness/lightheaded this morning . History of CAD/PE .

## 2023-09-04 NOTE — ED Notes (Signed)
 Patient verbalizes understanding of discharge instructions. Opportunity for questioning and answers were provided. Pt discharged from ED.

## 2023-09-04 NOTE — ED Provider Notes (Signed)
 [place] EMERGENCY DEPARTMENT AT [facility] Provider Note   CSN: [id number] Arrival date & time: 09/04/23  4742     History  Chief Complaint  Patient presents with   Shortness of Breath    Chest Heaviness /Lightheaded    Evan Leonard is a 63 y.o. male.  63 year old male with prior medical history as detailed below presents for evaluation.  Patient reports that he awoke this morning in his normal state of health.  He then developed vague feeling of discomfort and being unwell.  He denies chest pain, but reports heaviness to his chest.  He denies shortness of breath but feels "short winded".  Patient with history of PE.  He is compliant with Eliquis.  His last dose was taken yesterday evening.  He has not taken his a.m. medications.  He denies current chest pain or shortness of breath.  He reports that he feels improved at time of evaluation.  The history is provided by the patient.       Home Medications Prior to Admission medications   Medication Sig Start Date End Date Taking? Authorizing Provider  ACCU-CHEK GUIDE test strip USE AS DIRECTED UP TO 4 TIMES DAILY Patient taking differently: 1 each by Other route 4 (four) times daily. 12/30/19   Shelva Majestic, MD  acetaminophen (TYLENOL) 500 MG tablet Take 500-1,000 mg by mouth every 6 (six) hours as needed for mild pain or headache.    [provider]  aspirin EC 81 MG tablet Take 1 tablet (81 mg total) by mouth daily. Swallow whole. 02/18/22   Kathleene Hazel, MD  atorvastatin (LIPITOR) 80 MG tablet TAKE 1 TABLET BY MOUTH EVERY DAY 06/18/23   Shelva Majestic, MD  ELIQUIS 5 MG TABS tablet TAKE 1 TABLET BY MOUTH TWICE A DAY 04/14/23   Shelva Majestic, MD  metFORMIN (GLUCOPHAGE-XR) 500 MG 24 hr tablet TAKE 2 TABLETS (1,000 MG TOTAL) BY MOUTH IN THE MORNING AND AT BEDTIME. 06/18/23   Shelva Majestic, MD  metoprolol succinate (TOPROL-XL) 50 MG 24 hr tablet TAKE 1 TABLET BY MOUTH EVERY DAY  12/23/22   Shelva Majestic, MD  Multiple Vitamins-Minerals (CENTRUM SILVER 50+MEN) TABS Take 1 tablet by mouth daily with breakfast.    [provider]  nitroGLYCERIN (NITROSTAT) 0.4 MG SL tablet Place 1 tablet (0.4 mg total) under the tongue every 5 (five) minutes as needed for chest pain. 05/26/23   Shelva Majestic, MD  pantoprazole (PROTONIX) 40 MG tablet Take 1 tablet (40 mg total) by mouth daily. 05/26/23   Shelva Majestic, MD  PRESCRIPTION MEDICATION Inhale 1 Device into the lungs See admin instructions. CPAP- At bedtime every night    [provider]  Semaglutide, 1 MG/DOSE, 4 MG/3ML SOPN Inject 1 mg as directed once a week. 05/26/23   Shelva Majestic, MD  sildenafil (VIAGRA) 100 MG tablet Take 1 tablet (100 mg total) by mouth daily as needed for erectile dysfunction (do not take nitroglycerin within 24 hours of use). 01/06/23   Shelva Majestic, MD  Vitamin D, Ergocalciferol, (DRISDOL) 1.25 MG (50000 UNIT) CAPS capsule Take 1 capsule (50,000 Units total) by mouth every 7 (seven) days. 05/26/23   Shelva Majestic, MD      Allergies    Patient has no known allergies.    Review of Systems   Review of Systems  All other systems reviewed and are negative.   Physical Exam Updated Vital Signs BP Marland Kitchen)  135/97   Pulse 86   Temp 98 F (36.7 C) (Oral)   Resp 19   SpO2 100%  Physical Exam Vitals and nursing note reviewed.  Constitutional:      General: He is not in acute distress.    Appearance: Normal appearance. He is well-developed.  HENT:     Head: Normocephalic and atraumatic.  Eyes:     Conjunctiva/sclera: Conjunctivae normal.     Pupils: Pupils are equal, round, and reactive to light.  Cardiovascular:     Rate and Rhythm: Normal rate and regular rhythm.     Heart sounds: Normal heart sounds.  Pulmonary:     Effort: Pulmonary effort is normal. No respiratory distress.     Breath sounds: Normal breath sounds.  Abdominal:     General: There is no  distension.     Palpations: Abdomen is soft.     Tenderness: There is no abdominal tenderness.  Musculoskeletal:        General: No deformity. Normal range of motion.     Cervical back: Normal range of motion and neck supple.  Skin:    General: Skin is warm and dry.  Neurological:     General: No focal deficit present.     Mental Status: He is alert and oriented to person, place, and time.     ED Results / Procedures / Treatments   Labs (all labs ordered are listed, but only abnormal results are displayed) Labs Reviewed  PROTIME-INR - Abnormal; Notable for the following components:      Result Value   Prothrombin Time 15.3 (*)    All other components within normal limits  CBC  BASIC METABOLIC PANEL WITH GFR  TROPONIN I (HIGH SENSITIVITY)    EKG EKG Interpretation Date/Time:  Thursday September 04 2023 07:02:30 EDT Ventricular Rate:  87 PR Interval:  166 QRS Duration:  86 QT Interval:  342 QTC Calculation: 411 R Axis:   29  Text Interpretation: Normal sinus rhythm Nonspecific ST abnormality Abnormal ECG When compared with ECG of 28-Jul-2023 08:46, PREVIOUS ECG IS PRESENT Confirmed by Kristine Royal [phone]) on 09/04/2023 7:43:07 AM  Radiology DG Chest 2 View Result Date: 09/04/2023 CLINICAL DATA:  Shortness of breath. EXAM: CHEST - 2 VIEW COMPARISON:  CT angio chest from 01/01/2021 FINDINGS: The heart size and mediastinal contours are within normal limits. No pleural effusion, interstitial edema or consolidative change. Scar noted within the left base. The visualized skeletal structures are unremarkable. IMPRESSION: No active cardiopulmonary disease. Electronically Signed   By: Signa Kell M.D.   On: 09/04/2023 07:50    Procedures Procedures    Medications Ordered in ED Medications - No data to display  ED Course/ Medical Decision Making/ A&P                                 Medical Decision Making Amount and/or Complexity of Data Reviewed Labs:  ordered. Radiology: ordered.    Medical Screen Complete  This patient presented to the ED with complaint of Chest pain.  This complaint involves an extensive number of treatment options. The initial differential diagnosis includes, but is not limited to, ACS, angina, metabolic abnormality, etc.   This presentation is: Acute, Chronic, Self-Limited, Previously Undiagnosed, Uncertain Prognosis, Complicated, Systemic Symptoms, and Threat to Life/Bodily Function  Patient presented with poorly described chest discomfort.  Patient with known cardiac history.  However, initial EKG is without significant abnormality.  Initial troponins are reassuringly without elevation.  Patient seen by cardiology.  He is cleared for discharge home.  Importance of close follow-up is stressed.  Strict return precautions given understood.  Co morbidities that complicated the patient's evaluation  See HPI   Additional history obtained:  External records from outside sources obtained and reviewed including prior ED visits and prior Inpatient records.    Problem List / ED Course:  Chest pain   Disposition:  After consideration of the diagnostic results and the patients response to treatment, I feel that the patent would benefit from close outpatient followup.          Final Clinical Impression(s) / ED Diagnoses Final diagnoses:  Chest pain, unspecified type    Rx / DC Orders ED Discharge Orders     None         Wynetta Fines, MD 09/04/23 1551

## 2023-09-04 NOTE — ED Notes (Signed)
 PA notified for MSE at triage .

## 2023-09-04 NOTE — Discharge Instructions (Signed)
 Return for any problem.  ?

## 2023-09-04 NOTE — ED Notes (Signed)
 Pt resting with eyes closed; respirations spontaneous, even, unlabored

## 2023-09-17 ENCOUNTER — Encounter: Payer: Self-pay | Admitting: Emergency Medicine

## 2023-09-17 ENCOUNTER — Ambulatory Visit
Admission: EM | Admit: 2023-09-17 | Discharge: 2023-09-17 | Disposition: A | Discharge status: Home / Self Care | Payer: PRIVATE HEALTH INSURANCE | Attending: Emergency Medicine | Admitting: Emergency Medicine

## 2023-09-17 DIAGNOSIS — R0981 Nasal congestion: Secondary | ICD-10-CM | POA: Diagnosis not present

## 2023-09-17 DIAGNOSIS — J3489 Other specified disorders of nose and nasal sinuses: Secondary | ICD-10-CM

## 2023-09-17 DIAGNOSIS — J0110 Acute frontal sinusitis, unspecified: Secondary | ICD-10-CM

## 2023-09-17 MED ORDER — FLUTICASONE PROPIONATE 50 MCG/ACT NA SUSP: 2.0000 | Freq: Every day | NASAL | 0 refills | Status: AC

## 2023-09-17 MED ORDER — GUAIFENESIN ER 600 MG PO TB12: 600.0000 mg | ORAL_TABLET | Freq: Two times a day (BID) | ORAL | 0 refills | Status: AC

## 2023-09-17 NOTE — ED Provider Notes (Signed)
 [facility]    CSN: [id number] Arrival date & time: 09/17/23  0816      History   Chief Complaint Chief Complaint  Patient presents with   Nasal Congestion   Headache   Fatigue    HPI Evan Leonard is a 63 y.o. male.   Patient presents to clinic with concerns of nasal congestion, rhinorrhea and sinus pressure for the past 4 days.  Has not had any fever, cough, wheezing or shortness of breath.  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  Has tried Tylenol  and allergy medication.  Reports he does not usually get seasonal allergies.  Feels bilateral ear fullness.  Denies ear pain or drainage from the ears.  The history is provided by the patient and medical records.  Headache   Past Medical History:  Diagnosis Date   CAD (coronary artery disease)    Diabetes mellitus, type II (HCC) 02/2013   Diabetic ulcer of right foot due to type 2 diabetes mellitus (HCC) 03/21/2018   DVT (deep venous thrombosis) (HCC) 05/04/2012   LLE   Exertional dyspnea 05/04/2012   "isolated episode" (05/05/2012)   Hepatic steatosis    History of chickenpox    Hypertension 03/21/2018   Obesity, Class III, BMI 40-49.9 (morbid obesity) (HCC) 03/21/2018   OSA on CPAP    Peripheral vascular disease (HCC)    varicose veins   Pulmonary embolism (HCC) 05/04/2012   bilaterally   Pyelonephritis 04/2017   Small bowel obstruction (HCC) 09/06/2015   Varicose veins     Patient Active Problem List   Diagnosis Date Noted   Hyperlipidemia associated with type 2 diabetes mellitus (HCC) 03/25/2022   Positive colorectal cancer screening using Cologuard test 08/09/2021   Acid indigestion 08/09/2021   Hematochezia 08/09/2021   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 acute cor pulmonale (HCC) 06/05/2021   Pressure injury of skin 02/25/2021   Chronic anticoagulation 02/19/2021   CAD (coronary artery disease) 01/09/2021   Eczema 12/27/2019    Erectile dysfunction 12/27/2019   History of small bowel obstruction 08/20/2018   History of diabetic ulcer of foot 03/21/2018   Non-cardiac chest pain 02/09/2018   Renal stone 05/12/2017   S/P repair of recurrent ventral hernia 04/26/2017   History of pulmonary embolism 07/16/2016   Chronic venous insufficiency 01/25/2015   Incarcerated ventral hernia 12/13/2013   Diabetes mellitus with hyperglycemia (HCC) 03/31/2013   Leukocytosis 03/23/2013   Hyponatremia 03/23/2013   Morbid obesity with body mass index of 40.0-44.9 in adult [facility]) 05/05/2012   Obstructive sleep apnea 05/05/2012   DVT of left distal popliteal vein 05/05/2012    Past Surgical History:  Procedure Laterality Date   BOWEL RESECTION N/A 02/20/2021   Procedure: SMALL BOWEL RESECTION;  Surgeon: Derral Flick, MD;  Location: [facility] OR;  Service: General;  Laterality: N/A;   CORONARY STENT INTERVENTION N/A 01/02/2021   Procedure: CORONARY STENT INTERVENTION;  Surgeon: Lucendia Rusk, MD;  Location: [facility] INVASIVE CV LAB;  Service: Cardiovascular;  Laterality: N/A;   CYSTOSCOPY W/ URETERAL STENT PLACEMENT Right 05/12/2017   Procedure: CYSTOSCOPY WITH RETROGRADE PYELOGRAM/URETERAL STENT PLACEMENT;  Surgeon: Homero Luster, MD;  Location: [facility] OR;  Service: Urology;  Laterality: Right;   CYSTOSCOPY/URETEROSCOPY/HOLMIUM LASER/STENT PLACEMENT Right 07/29/2017   Procedure: CYSTOSCOPY RIGHT URETEROSCOPY HOLMIUM LASER STENT EXCHANGE;  Surgeon: Homero Luster, MD;  Location: [facility] ORS;  Service: Urology;  Laterality: Right;   HERNIA REPAIR  2015   LAPAROTOMY N/A 12/13/2013   Procedure: EXPLORATORY LAPAROTOMY Repair ventral hernia, without mesh, partial omentectomy;  Surgeon: Diantha Fossa, MD;  Location: [facility] OR;  Service: General;  Laterality: N/A;   LEFT HEART CATH AND CORONARY ANGIOGRAPHY N/A 01/02/2021   Procedure: LEFT HEART CATH AND CORONARY ANGIOGRAPHY;  Surgeon: Lucendia Rusk, MD;  Location: [facility] INVASIVE CV LAB;  Service:  Cardiovascular;  Laterality: N/A;   LYSIS OF ADHESION  02/20/2021   Procedure: LYSIS OF ADHESION;  Surgeon: Dorrie Gaudier, Alphonso Aschoff, MD;  Location: [facility] OR;  Service: General;;   VENTRAL HERNIA REPAIR N/A 04/26/2017   Procedure: INCARCERATED HERNIA REPAIR VENTRAL ADULT;  Surgeon: Dorena Gander, MD;  Location: [facility] OR;  Service: General;  Laterality: N/A;   VENTRAL HERNIA REPAIR N/A 02/20/2021   Procedure: EXPLORATORY LAPAROTOMY; RECURRENT HERNIA REPAIR WITH OVERLAY MESH;  Surgeon: Kinsinger, Alphonso Aschoff, MD;  Location: [facility] OR;  Service: General;  Laterality: N/A;       Home Medications    Prior to Admission medications   Medication Sig Start Date End Date Taking? Authorizing Provider  ACCU-CHEK GUIDE test strip USE AS DIRECTED UP TO 4 TIMES DAILY Patient taking differently: 1 each by Other route 4 (four) times daily. 12/30/19  Yes Almira Jaeger, MD  acetaminophen  (TYLENOL ) 500 MG tablet Take 500-1,000 mg by mouth every 6 (six) hours as needed for mild pain or headache.   Yes [provider]  aspirin  EC 81 MG tablet Take 1 tablet (81 mg total) by mouth daily. Swallow whole. 02/18/22  Yes Odie Benne, MD  atorvastatin  (LIPITOR ) 80 MG tablet TAKE 1 TABLET BY MOUTH EVERY DAY 06/18/23  Yes Almira Jaeger, MD  ELIQUIS  5 MG TABS tablet TAKE 1 TABLET BY MOUTH TWICE A DAY 04/14/23  Yes Almira Jaeger, MD  fluticasone  (FLONASE ) 50 MCG/ACT nasal spray Place 2 sprays into both nostrils daily. 09/17/23  Yes Reyhan Moronta  N, FNP  guaiFENesin  (MUCINEX ) 600 MG 12 hr tablet Take 1 tablet (600 mg total) by mouth 2 (two) times daily for 7 days. 09/17/23 09/24/23 Yes Aubri Gathright  N, FNP  metFORMIN  (GLUCOPHAGE -XR) 500 MG 24 hr tablet TAKE 2 TABLETS (1,000 MG TOTAL) BY MOUTH IN THE MORNING AND AT BEDTIME. 06/18/23  Yes Almira Jaeger, MD  metoprolol  succinate (TOPROL -XL) 50 MG 24 hr tablet TAKE 1 TABLET BY MOUTH EVERY DAY 12/23/22  Yes Almira Jaeger, MD  Multiple Vitamins-Minerals  (CENTRUM SILVER  50+MEN) TABS Take 1 tablet by mouth daily with breakfast.   Yes [provider]  pantoprazole  (PROTONIX ) 40 MG tablet Take 1 tablet (40 mg total) by mouth daily. 05/26/23  Yes Almira Jaeger, MD  PRESCRIPTION MEDICATION Inhale 1 Device into the lungs See admin instructions. CPAP- At bedtime every night   Yes [provider]  Semaglutide , 1 MG/DOSE, 4 MG/3ML SOPN Inject 1 mg as directed once a week. 05/26/23  Yes Almira Jaeger, MD  sildenafil  (VIAGRA ) 100 MG tablet Take 1 tablet (100 mg total) by mouth daily as needed for erectile dysfunction (do not take nitroglycerin  within 24 hours of use). 01/06/23  Yes Almira Jaeger, MD  nitroGLYCERIN  (NITROSTAT ) 0.4 MG SL tablet Place 1 tablet (0.4 mg total) under the tongue every 5 (five) minutes as needed for chest pain. 05/26/23   Almira Jaeger, MD  Vitamin D , Ergocalciferol , (DRISDOL ) 1.25 MG (50000 UNIT) CAPS capsule Take 1 capsule (50,000 Units total) by mouth every 7 (seven) days. 05/26/23   Hunter,  Saverio Curling, MD    Family History Family History  Problem Relation Age of Onset   Diabetes Mother        died age 2- also bleeding ulcer   Hypertension Mother    Heart failure Father        in his 66s (patient was 74). day before surgery planned   Other Maternal Grandmother        states natural causes all grandparents   Colon cancer Neg Hx    Esophageal cancer Neg Hx    Inflammatory bowel disease Neg Hx    Liver disease Neg Hx    Pancreatic cancer Neg Hx    Rectal cancer Neg Hx    Stomach cancer Neg Hx     Social History Social History   Tobacco Use   Smoking status: Never   Smokeless tobacco: Never  Vaping Use   Vaping status: Never Used  Substance Use Topics   Alcohol use: No    Alcohol/week: 0.0 standard drinks of alcohol   Drug use: No     Allergies   Patient has no known allergies.   Review of Systems Review of Systems  Per HPI  Physical Exam Triage Vital Signs ED Triage  Vitals [09/17/23 0845]  Encounter Vitals Group     BP 116/73     Systolic BP Percentile      Diastolic BP Percentile      Pulse Rate 74     Resp 14     Temp 97.8 F (36.6 C)     Temp Source Oral     SpO2 95 %     Weight      Height      Head Circumference      Peak Flow      Pain Score 2     Pain Loc      Pain Education      Exclude from Growth Chart    No data found.  Updated Vital Signs BP 116/73 (BP Location: Left Arm)   Pulse 74   Temp 97.8 F (36.6 C) (Oral)   Resp 14   SpO2 95%   Visual Acuity Right Eye Distance:   Left Eye Distance:   Bilateral Distance:    Right Eye Near:   Left Eye Near:    Bilateral Near:     Physical Exam Vitals and nursing note reviewed.  Constitutional:      Appearance: Normal appearance. He is well-developed.  HENT:     Head: Normocephalic and atraumatic.     Right Ear: Tympanic membrane, ear canal and external ear normal.     Left Ear: Tympanic membrane, ear canal and external ear normal.     Nose: Congestion and rhinorrhea present.     Mouth/Throat:     Mouth: Mucous membranes are moist.  Eyes:     Conjunctiva/sclera: Conjunctivae normal.  Cardiovascular:     Rate and Rhythm: Normal rate and regular rhythm.     Heart sounds: Normal heart sounds. No murmur heard. Pulmonary:     Effort: Pulmonary effort is normal. No respiratory distress.     Breath sounds: Normal breath sounds.  Skin:    General: Skin is warm and dry.  Neurological:     General: No focal deficit present.     Mental Status: He is alert.  Psychiatric:        Mood and Affect: Mood normal.      UC Treatments / Results  Labs (all labs  ordered are listed, but only abnormal results are displayed) Labs Reviewed - No data to display  EKG   Radiology No results found.  Procedures Procedures (including critical care time)  Medications Ordered in UC Medications - No data to display  Initial Impression / Assessment and Plan / UC Course  I have  reviewed the triage vital signs and the nursing notes.  Pertinent labs & imaging results that were available during my care of the patient were reviewed by me and considered in my medical decision making (see chart for details).  Vitals in triage reviewed, patient is hemodynamically stable.  Lungs are vesicular, or with regular rate and rhythm.  Congestion and rhinorrhea with postnasal drip present on physical exam.  Discussed at this point, acute sinusitis is most likely viral or allergic and less likely bacterial.  Symptomatic management and methods for over-the-counter decongestion discussed.  Plan of care, follow-up care return precautions given, no questions at this time.  Work note provided.    Final Clinical Impressions(s) / UC Diagnoses   Final diagnoses:  Nasal congestion with rhinorrhea  Acute frontal sinusitis, recurrence not specified     Discharge Instructions      For your nasal congestion take 1200 mg of Mucinex  daily to help loosen secretions in addition to drinking at least 64 ounces of water  daily.  Sleep with humidifier may help loosen secretions as well.  Start the Flonase  nasal spray, 2 sprays into both nostrils twice daily.  Using a sinus rinses and a good way to manually clear your sinuses of congestion.  Use filtered water  with a Nettie pot or similar over-the-counter brand.  Symptoms should improve over the next few days.  If you find that you are still having sinus pressure and a headache around the 7 to 10-day mark, return to clinic or follow-up with your primary care provider for reevaluation.     ED Prescriptions     Medication Sig Dispense Auth. Provider   guaiFENesin  (MUCINEX ) 600 MG 12 hr tablet Take 1 tablet (600 mg total) by mouth 2 (two) times daily for 7 days. 14 tablet Aleia Larocca  N, FNP   fluticasone  (FLONASE ) 50 MCG/ACT nasal spray Place 2 sprays into both nostrils daily. 9.9 mL Harlow Lighter, Icey Tello  N, FNP      PDMP not reviewed this  encounter.   Harlow Lighter, Kimarie Coor  N, FNP 09/17/23 [id number]

## 2023-09-17 NOTE — Discharge Instructions (Addendum)
 For your nasal congestion take 1200 mg of Mucinex  daily to help loosen secretions in addition to drinking at least 64 ounces of water  daily.  Sleep with humidifier may help loosen secretions as well.  Start the Flonase  nasal spray, 2 sprays into both nostrils twice daily.  Using a sinus rinses and a good way to manually clear your sinuses of congestion.  Use filtered water  with a Nettie pot or similar over-the-counter brand.  Symptoms should improve over the next few days.  If you find that you are still having sinus pressure and a headache around the 7 to 10-day mark, return to clinic or follow-up with your primary care provider for reevaluation.

## 2023-09-17 NOTE — ED Triage Notes (Signed)
 Pt reports nasal congestion, headache, and fatigue x4 days. No hx of seasonal allergies per pt. No relief with tylenol  or allegra at home. Denies fevers, sore throat, and cough. No sick contacts.

## 2023-09-22 ENCOUNTER — Encounter: Payer: Self-pay | Admitting: Emergency Medicine

## 2023-09-22 ENCOUNTER — Other Ambulatory Visit: Payer: Self-pay | Admitting: Family Medicine

## 2023-09-22 ENCOUNTER — Ambulatory Visit
Admission: EM | Admit: 2023-09-22 | Discharge: 2023-09-22 | Disposition: A | Discharge status: Home / Self Care | Payer: PRIVATE HEALTH INSURANCE | Attending: Emergency Medicine | Admitting: Emergency Medicine

## 2023-09-22 DIAGNOSIS — H9203 Otalgia, bilateral: Secondary | ICD-10-CM

## 2023-09-22 DIAGNOSIS — H60332 Swimmer's ear, left ear: Secondary | ICD-10-CM | POA: Diagnosis not present

## 2023-09-22 MED ORDER — DOXYCYCLINE HYCLATE 100 MG PO CAPS: 100.0000 mg | ORAL_CAPSULE | Freq: Two times a day (BID) | ORAL | 0 refills | Status: AC

## 2023-09-22 MED ORDER — CIPROFLOXACIN-DEXAMETHASONE 0.3-0.1 % OT SUSP: 4.0000 [drp] | Freq: Two times a day (BID) | OTIC | 0 refills | Status: AC

## 2023-09-22 NOTE — ED Triage Notes (Signed)
 Pt c/o ear pain in mostly left ear. Pt says he is starting to feel a little pain in right ear as well. Pt also has noticed a small lump right below his left ear that wasn't there before.

## 2023-09-22 NOTE — Discharge Instructions (Addendum)
 Use ear drops as directed, do not stick anything in ears,no water  in ears Take antibiotic as directed Drink plenty of water  Follow up with PCP, return as needed

## 2023-09-22 NOTE — ED Provider Notes (Signed)
 [facility]    CSN: [id number] Arrival date & time: 09/22/23  1420      History   Chief Complaint Chief Complaint  Patient presents with   Otalgia    HPI Evan Leonard is a 63 y.o. male.   63 year old male pt, Evan Leonard, presents to care for evaluation of ear pain x 9 days.  She was recently seen for viral illness and now reports worsening ear pain left worse than right, patient also has a small lump under his left ear that was not there before.  Pmh: Hypertension, fatty liver, coronary artery disease, diabetes, CAD  The history is provided by the patient. No language interpreter was used.    Past Medical History:  Diagnosis Date   CAD (coronary artery disease)    Diabetes mellitus, type II (HCC) 02/2013   Diabetic ulcer of right foot due to type 2 diabetes mellitus (HCC) 03/21/2018   DVT (deep venous thrombosis) (HCC) 05/04/2012   LLE   Exertional dyspnea 05/04/2012   "isolated episode" (05/05/2012)   Hepatic steatosis    History of chickenpox    Hypertension 03/21/2018   Obesity, Class III, BMI 40-49.9 (morbid obesity) (HCC) 03/21/2018   OSA on CPAP    Peripheral vascular disease (HCC)    varicose veins   Pulmonary embolism (HCC) 05/04/2012   bilaterally   Pyelonephritis 04/2017   Small bowel obstruction (HCC) 09/06/2015   Varicose veins     Patient Active Problem List   Diagnosis Date Noted   Acute swimmer's ear of left side 09/22/2023   Otalgia of both ears 09/22/2023   Hyperlipidemia associated with type 2 diabetes mellitus (HCC) 03/25/2022   Positive colorectal cancer screening using Cologuard test 08/09/2021   Acid indigestion 08/09/2021   Hematochezia 08/09/2021   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 acute cor pulmonale (HCC) 06/05/2021   Pressure injury of skin 02/25/2021   Chronic anticoagulation 02/19/2021   CAD (coronary artery disease) 01/09/2021   Eczema 12/27/2019   Erectile dysfunction 12/27/2019   History of  small bowel obstruction 08/20/2018   History of diabetic ulcer of foot 03/21/2018   Non-cardiac chest pain 02/09/2018   Renal stone 05/12/2017   S/P repair of recurrent ventral hernia 04/26/2017   History of pulmonary embolism 07/16/2016   Chronic venous insufficiency 01/25/2015   Incarcerated ventral hernia 12/13/2013   Diabetes mellitus with hyperglycemia (HCC) 03/31/2013   Leukocytosis 03/23/2013   Hyponatremia 03/23/2013   Morbid obesity with body mass index of 40.0-44.9 in [facility]) 05/05/2012   Obstructive sleep apnea 05/05/2012   DVT of left distal popliteal vein 05/05/2012    Past Surgical History:  Procedure Laterality Date   BOWEL RESECTION N/A 02/20/2021   Procedure: SMALL BOWEL RESECTION;  Surgeon: Derral Flick, MD;  Location: [facility] OR;  Service: General;  Laterality: N/A;   CORONARY STENT INTERVENTION N/A 01/02/2021   Procedure: CORONARY STENT INTERVENTION;  Surgeon: Lucendia Rusk, MD;  Location: [facility] INVASIVE CV LAB;  Service: Cardiovascular;  Laterality: N/A;   CYSTOSCOPY W/ URETERAL STENT PLACEMENT Right 05/12/2017   Procedure: CYSTOSCOPY WITH RETROGRADE PYELOGRAM/URETERAL STENT PLACEMENT;  Surgeon: Homero Luster, MD;  Location: [facility] OR;  Service: Urology;  Laterality: Right;   CYSTOSCOPY/URETEROSCOPY/HOLMIUM LASER/STENT PLACEMENT Right 07/29/2017   Procedure: CYSTOSCOPY RIGHT URETEROSCOPY HOLMIUM LASER STENT EXCHANGE;  Surgeon: Homero Luster, MD;  Location: [facility] ORS;  Service: Urology;  Laterality: Right;   HERNIA REPAIR     2015   LAPAROTOMY N/A 12/13/2013  Procedure: EXPLORATORY LAPAROTOMY Repair ventral hernia, without mesh, partial omentectomy;  Surgeon: Diantha Fossa, MD;  Location: [facility] OR;  Service: General;  Laterality: N/A;   LEFT HEART CATH AND CORONARY ANGIOGRAPHY N/A 01/02/2021   Procedure: LEFT HEART CATH AND CORONARY ANGIOGRAPHY;  Surgeon: Lucendia Rusk, MD;  Location: [facility] INVASIVE CV LAB;  Service: Cardiovascular;  Laterality: N/A;   LYSIS OF ADHESION   02/20/2021   Procedure: LYSIS OF ADHESION;  Surgeon: Dorrie Gaudier, Alphonso Aschoff, MD;  Location: [facility] OR;  Service: General;;   VENTRAL HERNIA REPAIR N/A 04/26/2017   Procedure: INCARCERATED HERNIA REPAIR VENTRAL ADULT;  Surgeon: Dorena Gander, MD;  Location: [facility] OR;  Service: General;  Laterality: N/A;   VENTRAL HERNIA REPAIR N/A 02/20/2021   Procedure: EXPLORATORY LAPAROTOMY; RECURRENT HERNIA REPAIR WITH OVERLAY MESH;  Surgeon: Kinsinger, Alphonso Aschoff, MD;  Location: [facility] OR;  Service: General;  Laterality: N/A;       Home Medications    Prior to Admission medications   Medication Sig Start Date End Date Taking? Authorizing Provider  ciprofloxacin -dexamethasone  (CIPRODEX) OTIC suspension Place 4 drops into the left ear 2 (two) times daily for 7 days. 09/22/23 09/29/23 Yes Malcomb Gangemi, Eveleen Hinds, NP  doxycycline  (VIBRAMYCIN ) 100 MG capsule Take 1 capsule (100 mg total) by mouth 2 (two) times daily for 7 days. 09/22/23 09/29/23 Yes Denver Harder, Eveleen Hinds, NP  ACCU-CHEK GUIDE test strip USE AS DIRECTED UP TO 4 TIMES DAILY Patient taking differently: 1 each by Other route 4 (four) times daily. 12/30/19   Almira Jaeger, MD  acetaminophen  (TYLENOL ) 500 MG tablet Take 500-1,000 mg by mouth every 6 (six) hours as needed for mild pain or headache.    [provider]  aspirin  EC 81 MG tablet Take 1 tablet (81 mg total) by mouth daily. Swallow whole. 02/18/22   Odie Benne, MD  atorvastatin  (LIPITOR ) 80 MG tablet TAKE 1 TABLET BY MOUTH EVERY DAY 06/18/23   Almira Jaeger, MD  ELIQUIS  5 MG TABS tablet TAKE 1 TABLET BY MOUTH TWICE A DAY 04/14/23   Almira Jaeger, MD  fluticasone  (FLONASE ) 50 MCG/ACT nasal spray Place 2 sprays into both nostrils daily. 09/17/23   Harlow Lighter, [place]  N, FNP  guaiFENesin  (MUCINEX ) 600 MG 12 hr tablet Take 1 tablet (600 mg total) by mouth 2 (two) times daily for 7 days. 09/17/23 09/24/23  Harlow Lighter, [place]  N, FNP  metFORMIN  (GLUCOPHAGE -XR) 500 MG 24 hr tablet TAKE 2 TABLETS  (1,000 MG TOTAL) BY MOUTH IN THE MORNING AND AT BEDTIME. 06/18/23   Almira Jaeger, MD  metoprolol  succinate (TOPROL -XL) 50 MG 24 hr tablet TAKE 1 TABLET BY MOUTH EVERY DAY 12/23/22   Almira Jaeger, MD  Multiple Vitamins-Minerals (CENTRUM SILVER  50+MEN) TABS Take 1 tablet by mouth daily with breakfast.    [provider]  nitroGLYCERIN  (NITROSTAT ) 0.4 MG SL tablet Place 1 tablet (0.4 mg total) under the tongue every 5 (five) minutes as needed for chest pain. 05/26/23   Almira Jaeger, MD  pantoprazole  (PROTONIX ) 40 MG tablet Take 1 tablet (40 mg total) by mouth daily. 05/26/23   Almira Jaeger, MD  PRESCRIPTION MEDICATION Inhale 1 Device into the lungs See admin instructions. CPAP- At bedtime every night    [provider]  Semaglutide , 1 MG/DOSE, 4 MG/3ML SOPN Inject 1 mg as directed once a week. 05/26/23   Almira Jaeger, MD  sildenafil  (VIAGRA ) 100 MG tablet Take 1 tablet (100 mg total) by mouth daily as needed for erectile  dysfunction (do not take nitroglycerin  within 24 hours of use). 01/06/23   Almira Jaeger, MD  Vitamin D , Ergocalciferol , (DRISDOL ) 1.25 MG (50000 UNIT) CAPS capsule TAKE 1 CAPSULE (50,000 UNITS TOTAL) BY MOUTH EVERY 7 (SEVEN) DAYS 09/22/23   Almira Jaeger, MD    Family History Family History  Problem Relation Age of Onset   Diabetes Mother        died age 26- also bleeding ulcer   Hypertension Mother    Heart failure Father        in his 86s (patient was 24). day before surgery planned   Other Maternal Grandmother        states natural causes all grandparents   Colon cancer Neg Hx    Esophageal cancer Neg Hx    Inflammatory bowel disease Neg Hx    Liver disease Neg Hx    Pancreatic cancer Neg Hx    Rectal cancer Neg Hx    Stomach cancer Neg Hx     Social History Social History   Tobacco Use   Smoking status: Never   Smokeless tobacco: Never  Vaping Use   Vaping status: Never Used  Substance Use Topics   Alcohol use:  No    Alcohol/week: 0.0 standard drinks of alcohol   Drug use: No     Allergies   Patient has no known allergies.   Review of Systems Review of Systems  Constitutional:  Negative for fever.  HENT:  Positive for congestion, ear pain, sinus pressure and sinus pain.   Respiratory:  Positive for cough.   All other systems reviewed and are negative.    Physical Exam Triage Vital Signs ED Triage Vitals  Encounter Vitals Group     BP 09/22/23 1542 (!) 154/77     Systolic BP Percentile --      Diastolic BP Percentile --      Pulse Rate 09/22/23 1542 87     Resp 09/22/23 1542 18     Temp 09/22/23 1542 98.8 F (37.1 C)     Temp Source 09/22/23 1542 Oral     SpO2 09/22/23 1542 96 %     Weight 09/22/23 1541 (!) 341 lb 4.4 oz (154.8 kg)     Height --      Head Circumference --      Peak Flow --      Pain Score 09/22/23 1541 9     Pain Loc --      Pain Education --      Exclude from Growth Chart --    No data found.  Updated Vital Signs BP (!) 154/77 (BP Location: Right Arm)   Pulse 87   Temp 98.8 F (37.1 C) (Oral)   Resp 18   Wt (!) 341 lb 4.4 oz (154.8 kg)   SpO2 96%   BMI 44.72 kg/m   Visual Acuity Right Eye Distance:   Left Eye Distance:   Bilateral Distance:    Right Eye Near:   Left Eye Near:    Bilateral Near:     Physical Exam Vitals and nursing note reviewed.  Constitutional:      Appearance: He is well-developed and well-groomed.  HENT:     Head: Normocephalic.     Right Ear: Tympanic membrane is retracted.     Left Ear: External ear normal. Swelling and tenderness present.     Nose: Congestion present.     Right Sinus: Maxillary sinus tenderness present.     Left  Sinus: Maxillary sinus tenderness present.  Cardiovascular:     Rate and Rhythm: Normal rate and regular rhythm.     Heart sounds: Normal heart sounds.  Pulmonary:     Effort: Pulmonary effort is normal.     Breath sounds: Normal breath sounds and air entry.  Musculoskeletal:      Cervical back: Neck supple.  Lymphadenopathy:     Cervical: Cervical adenopathy present.     Left cervical: Superficial cervical adenopathy present.  Neurological:     General: No focal deficit present.     Mental Status: He is alert and oriented to person, place, and time.     GCS: GCS eye subscore is 4. GCS verbal subscore is 5. GCS motor subscore is 6.  Psychiatric:        Behavior: Behavior is cooperative.      UC Treatments / Results  Labs (all labs ordered are listed, but only abnormal results are displayed) Labs Reviewed - No data to display  EKG   Radiology No results found.  Procedures Procedures (including critical care time)  Medications Ordered in UC Medications - No data to display  Initial Impression / Assessment and Plan / UC Course  I have reviewed the triage vital signs and the nursing notes.  Pertinent labs & imaging results that were available during my care of the patient were reviewed by me and considered in my medical decision making (see chart for details).    Discussed exam findings and plan of care with patient, treated with Ciprodex and doxycycline  , strict go to ER precautions given.   Patient verbalized understanding to this provider.  Ddx: Acute otitis externa left, otalgia bilateral, allergies, viral illness Final Clinical Impressions(s) / UC Diagnoses   Final diagnoses:  Acute swimmer's ear of left side  Otalgia of both ears     Discharge Instructions      Use ear drops as directed, do not stick anything in ears,no water  in ears Take antibiotic as directed Drink plenty of water  Follow up with PCP, return as needed     ED Prescriptions     Medication Sig Dispense Auth. Provider   doxycycline  (VIBRAMYCIN ) 100 MG capsule Take 1 capsule (100 mg total) by mouth 2 (two) times daily for 7 days. 14 capsule Atharv Barriere, NP   ciprofloxacin -dexamethasone  (CIPRODEX) OTIC suspension Place 4 drops into the left ear 2 (two) times  daily for 7 days. 7.5 mL Ilham Roughton, Eveleen Hinds, NP      PDMP not reviewed this encounter.   Peter Brands, NP 09/22/23 [phone]

## 2023-09-29 ENCOUNTER — Ambulatory Visit: Payer: PRIVATE HEALTH INSURANCE | Admitting: Family Medicine

## 2023-11-03 ENCOUNTER — Ambulatory Visit: Payer: PRIVATE HEALTH INSURANCE | Admitting: Family Medicine

## 2023-11-12 ENCOUNTER — Other Ambulatory Visit: Payer: Self-pay | Admitting: Family Medicine

## 2023-11-22 ENCOUNTER — Encounter: Payer: Self-pay | Admitting: *Deleted

## 2023-11-22 ENCOUNTER — Other Ambulatory Visit: Payer: Self-pay

## 2023-11-22 ENCOUNTER — Ambulatory Visit
Admission: EM | Admit: 2023-11-22 | Discharge: 2023-11-22 | Disposition: A | Discharge status: Home / Self Care | Payer: PRIVATE HEALTH INSURANCE | Attending: Physician Assistant | Admitting: Physician Assistant

## 2023-11-22 DIAGNOSIS — N3000 Acute cystitis without hematuria: Secondary | ICD-10-CM | POA: Insufficient documentation

## 2023-11-22 LAB — POCT URINALYSIS DIP (MANUAL ENTRY)
Bilirubin, UA: NEGATIVE
Glucose, UA: 100 mg/dL — AB
Nitrite, UA: POSITIVE — AB
Protein Ur, POC: 100 mg/dL — AB
Spec Grav, UA: 1.025 (ref 1.010–1.025)
Urobilinogen, UA: 1 U/dL
pH, UA: 6.5 (ref 5.0–8.0)

## 2023-11-22 MED ORDER — CIPROFLOXACIN HCL 500 MG PO TABS: 500.0000 mg | ORAL_TABLET | Freq: Two times a day (BID) | ORAL | 0 refills | Status: DC

## 2023-11-22 NOTE — ED Triage Notes (Addendum)
 Pt reports burning with urination and urinary frequency, with strong odor x 3-4 days. Subjective fever and body aches. States I woke up at 2am and was sweating so bad it looked like you had poured water  on me. Voices concern for UTI. Taking advil  for pain

## 2023-11-22 NOTE — ED Provider Notes (Signed)
 [facility]    CSN: [id number] Arrival date & time: 11/22/23  1228      History   Chief Complaint Chief Complaint  Patient presents with   Urinary Frequency    HPI Evan Leonard is a 63 y.o. male.   The history is provided by the patient.  Urinary Frequency    Past Medical History:  Diagnosis Date   CAD (coronary artery disease)    Diabetes mellitus, type II (HCC) 02/2013   Diabetic ulcer of right foot due to type 2 diabetes mellitus (HCC) 03/21/2018   DVT (deep venous thrombosis) (HCC) 05/04/2012   LLE   Exertional dyspnea 05/04/2012   isolated episode (05/05/2012)   Hepatic steatosis    History of chickenpox    Hypertension 03/21/2018   Obesity, Class III, BMI 40-49.9 (morbid obesity) 03/21/2018   OSA on CPAP    Peripheral vascular disease (HCC)    varicose veins   Pulmonary embolism (HCC) 05/04/2012   bilaterally   Pyelonephritis 04/2017   Small bowel obstruction (HCC) 09/06/2015   Varicose veins     Patient Active Problem List   Diagnosis Date Noted   Acute swimmer's ear of left side 09/22/2023   Otalgia of both ears 09/22/2023   Hyperlipidemia associated with type 2 diabetes mellitus (HCC) 03/25/2022   Positive colorectal cancer screening using Cologuard test 08/09/2021   Acid indigestion 08/09/2021   Hematochezia 08/09/2021   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 acute cor pulmonale (HCC) 06/05/2021   Pressure injury of skin 02/25/2021   Chronic anticoagulation 02/19/2021   CAD (coronary artery disease) 01/09/2021   Eczema 12/27/2019   Erectile dysfunction 12/27/2019   History of small bowel obstruction 08/20/2018   History of diabetic ulcer of foot 03/21/2018   Non-cardiac chest pain 02/09/2018   Renal stone 05/12/2017   S/P repair of recurrent ventral hernia 04/26/2017   History of pulmonary embolism 07/16/2016   Chronic venous insufficiency 01/25/2015   Incarcerated ventral hernia 12/13/2013   Diabetes mellitus with  hyperglycemia (HCC) 03/31/2013   Leukocytosis 03/23/2013   Hyponatremia 03/23/2013   Morbid obesity with body mass index of 40.0-44.9 in adult [facility]) 05/05/2012   Obstructive sleep apnea 05/05/2012   DVT of left distal popliteal vein 05/05/2012    Past Surgical History:  Procedure Laterality Date   BOWEL RESECTION N/A 02/20/2021   Procedure: SMALL BOWEL RESECTION;  Surgeon: Stevie Herlene Righter, MD;  Location: [facility] OR;  Service: General;  Laterality: N/A;   CORONARY STENT INTERVENTION N/A 01/02/2021   Procedure: CORONARY STENT INTERVENTION;  Surgeon: Dann Candyce RAMAN, MD;  Location: [facility] INVASIVE CV LAB;  Service: Cardiovascular;  Laterality: N/A;   CYSTOSCOPY W/ URETERAL STENT PLACEMENT Right 05/12/2017   Procedure: CYSTOSCOPY WITH RETROGRADE PYELOGRAM/URETERAL STENT PLACEMENT;  Surgeon: Watt Rush, MD;  Location: [facility] OR;  Service: Urology;  Laterality: Right;   CYSTOSCOPY/URETEROSCOPY/HOLMIUM LASER/STENT PLACEMENT Right 07/29/2017   Procedure: CYSTOSCOPY RIGHT URETEROSCOPY HOLMIUM LASER STENT EXCHANGE;  Surgeon: Watt Rush, MD;  Location: [facility] ORS;  Service: Urology;  Laterality: Right;   HERNIA REPAIR     2015   LAPAROTOMY N/A 12/13/2013   Procedure: EXPLORATORY LAPAROTOMY Repair ventral hernia, without mesh, partial omentectomy;  Surgeon: Lynwood MALVA Pina, MD;  Location: [facility] OR;  Service: General;  Laterality: N/A;   LEFT HEART CATH AND CORONARY ANGIOGRAPHY N/A 01/02/2021   Procedure: LEFT HEART CATH AND CORONARY ANGIOGRAPHY;  Surgeon: Dann Candyce RAMAN, MD;  Location: [facility] INVASIVE CV LAB;  Service: Cardiovascular;  Laterality: N/A;  LYSIS OF ADHESION  02/20/2021   Procedure: LYSIS OF ADHESION;  Surgeon: Kinsinger, Herlene Righter, MD;  Location: [facility] OR;  Service: General;;   VENTRAL HERNIA REPAIR N/A 04/26/2017   Procedure: INCARCERATED HERNIA REPAIR VENTRAL ADULT;  Surgeon: Sebastian Moles, MD;  Location: [facility] OR;  Service: General;  Laterality: N/A;   VENTRAL HERNIA REPAIR N/A 02/20/2021   Procedure:  EXPLORATORY LAPAROTOMY; RECURRENT HERNIA REPAIR WITH OVERLAY MESH;  Surgeon: Kinsinger, Herlene Righter, MD;  Location: [facility] OR;  Service: General;  Laterality: N/A;       Home Medications    Prior to Admission medications   Medication Sig Start Date End Date Taking? Authorizing Provider  acetaminophen  (TYLENOL ) 500 MG tablet Take 500-1,000 mg by mouth every 6 (six) hours as needed for mild pain or headache.   Yes [provider]  aspirin  EC 81 MG tablet Take 1 tablet (81 mg total) by mouth daily. Swallow whole. 02/18/22  Yes Verlin Lonni BIRCH, MD  atorvastatin  (LIPITOR ) 80 MG tablet TAKE 1 TABLET BY MOUTH EVERY DAY 06/18/23  Yes Katrinka Garnette KIDD, MD  ELIQUIS  5 MG TABS tablet TAKE 1 TABLET BY MOUTH TWICE A DAY 11/13/23  Yes Katrinka Garnette KIDD, MD  metFORMIN  (GLUCOPHAGE -XR) 500 MG 24 hr tablet TAKE 2 TABLETS (1,000 MG TOTAL) BY MOUTH IN THE MORNING AND AT BEDTIME. 06/18/23  Yes Katrinka Garnette KIDD, MD  metoprolol  succinate (TOPROL -XL) 50 MG 24 hr tablet TAKE 1 TABLET BY MOUTH EVERY DAY 12/23/22  Yes Katrinka Garnette KIDD, MD  Multiple Vitamins-Minerals (CENTRUM SILVER  50+MEN) TABS Take 1 tablet by mouth daily with breakfast.   Yes [provider]  nitroGLYCERIN  (NITROSTAT ) 0.4 MG SL tablet Place 1 tablet (0.4 mg total) under the tongue every 5 (five) minutes as needed for chest pain. 05/26/23  Yes Katrinka Garnette KIDD, MD  pantoprazole  (PROTONIX ) 40 MG tablet Take 1 tablet (40 mg total) by mouth daily. 05/26/23  Yes Katrinka Garnette KIDD, MD  PRESCRIPTION MEDICATION Inhale 1 Device into the lungs See admin instructions. CPAP- At bedtime every night   Yes [provider]  Semaglutide , 1 MG/DOSE, 4 MG/3ML SOPN Inject 1 mg as directed once a week. 05/26/23  Yes Katrinka Garnette KIDD, MD  Vitamin D , Ergocalciferol , (DRISDOL ) 1.25 MG (50000 UNIT) CAPS capsule TAKE 1 CAPSULE (50,000 UNITS TOTAL) BY MOUTH EVERY 7 (SEVEN) DAYS 09/22/23  Yes Katrinka Garnette KIDD, MD  ACCU-CHEK GUIDE test strip USE AS  DIRECTED UP TO 4 TIMES DAILY Patient taking differently: 1 each by Other route 4 (four) times daily. 12/30/19   Katrinka Garnette KIDD, MD  fluticasone  (FLONASE ) 50 MCG/ACT nasal spray Place 2 sprays into both nostrils daily. 09/17/23   Dreama, [place]  N, FNP  sildenafil  (VIAGRA ) 100 MG tablet Take 1 tablet (100 mg total) by mouth daily as needed for erectile dysfunction (do not take nitroglycerin  within 24 hours of use). 01/06/23   Katrinka Garnette KIDD, MD    Family History Family History  Problem Relation Age of Onset   Diabetes Mother        died age 31- also bleeding ulcer   Hypertension Mother    Heart failure Father        in his 10s (patient was 53). day before surgery planned   Other Maternal Grandmother        states natural causes all grandparents   Colon cancer Neg Hx    Esophageal cancer Neg Hx    Inflammatory bowel disease Neg Hx    Liver disease  Neg Hx    Pancreatic cancer Neg Hx    Rectal cancer Neg Hx    Stomach cancer Neg Hx     Social History Social History   Tobacco Use   Smoking status: Never   Smokeless tobacco: Never  Vaping Use   Vaping status: Never Used  Substance Use Topics   Alcohol use: No    Alcohol/week: 0.0 standard drinks of alcohol   Drug use: No     Allergies   Patient has no known allergies.   Review of Systems Review of Systems  Genitourinary:  Positive for frequency.     Physical Exam Triage Vital Signs ED Triage Vitals  Encounter Vitals Group     BP 11/22/23 1244 122/72     Girls Systolic BP Percentile --      Girls Diastolic BP Percentile --      Boys Systolic BP Percentile --      Boys Diastolic BP Percentile --      Pulse Rate 11/22/23 1244 99     Resp 11/22/23 1244 20     Temp 11/22/23 1244 98.4 F (36.9 C)     Temp Source 11/22/23 1244 Oral     SpO2 11/22/23 1244 97 %     Weight --      Height --      Head Circumference --      Peak Flow --      Pain Score 11/22/23 1240 6     Pain Loc --      Pain Education --       Exclude from Growth Chart --    No data found.  Updated Vital Signs BP 122/72 (BP Location: Left Arm)   Pulse 99   Temp 98.4 F (36.9 C) (Oral)   Resp 20   SpO2 97%   Visual Acuity Right Eye Distance:   Left Eye Distance:   Bilateral Distance:    Right Eye Near:   Left Eye Near:    Bilateral Near:     Physical Exam   UC Treatments / Results  Labs (all labs ordered are listed, but only abnormal results are displayed) Labs Reviewed  POCT URINALYSIS DIP (MANUAL ENTRY)    EKG   Radiology No results found.  Procedures Procedures (including critical care time)  Medications Ordered in UC Medications - No data to display  Initial Impression / Assessment and Plan / UC Course  I have reviewed the triage vital signs and the nursing notes.  Pertinent labs & imaging results that were available during my care of the patient were reviewed by me and considered in my medical decision making (see chart for details).     *** Final Clinical Impressions(s) / UC Diagnoses   Final diagnoses:  None   Discharge Instructions   None    ED Prescriptions   None    PDMP not reviewed this encounter.

## 2023-11-24 ENCOUNTER — Ambulatory Visit (HOSPITAL_COMMUNITY): Payer: Self-pay

## 2023-11-24 LAB — URINE CULTURE: Culture: >=100000 — AB

## 2023-12-11 ENCOUNTER — Other Ambulatory Visit: Payer: Self-pay | Admitting: Family Medicine

## 2023-12-12 ENCOUNTER — Other Ambulatory Visit: Payer: Self-pay | Admitting: Family Medicine

## 2023-12-15 ENCOUNTER — Ambulatory Visit: Payer: PRIVATE HEALTH INSURANCE | Admitting: Family Medicine

## 2024-02-01 ENCOUNTER — Other Ambulatory Visit: Payer: Self-pay | Admitting: Family Medicine

## 2024-02-02 ENCOUNTER — Ambulatory Visit
Admission: EM | Admit: 2024-02-02 | Discharge: 2024-02-02 | Disposition: A | Discharge status: Home / Self Care | Payer: PRIVATE HEALTH INSURANCE | Attending: Physician Assistant | Admitting: Physician Assistant

## 2024-02-02 ENCOUNTER — Ambulatory Visit: Payer: PRIVATE HEALTH INSURANCE | Admitting: Family Medicine

## 2024-02-02 ENCOUNTER — Encounter: Payer: Self-pay | Admitting: Emergency Medicine

## 2024-02-02 DIAGNOSIS — R300 Dysuria: Secondary | ICD-10-CM

## 2024-02-02 DIAGNOSIS — E1165 Type 2 diabetes mellitus with hyperglycemia: Secondary | ICD-10-CM | POA: Diagnosis not present

## 2024-02-02 LAB — POCT URINE DIPSTICK
Bilirubin, UA: NEGATIVE
Glucose, UA: 100 mg/dL — AB
Ketones, POC UA: NEGATIVE mg/dL
Leukocytes, UA: NEGATIVE
Nitrite, UA: NEGATIVE
POC PROTEIN,UA: 30 — AB
Spec Grav, UA: 1.025 (ref 1.010–1.025)
Urobilinogen, UA: 0.2 U/dL
pH, UA: 7 (ref 5.0–8.0)

## 2024-02-02 LAB — GLUCOSE, POCT (MANUAL RESULT ENTRY): POCT Glucose (KUC): 171 mg/dL — AB (ref 70–99)

## 2024-02-02 NOTE — ED Triage Notes (Signed)
 Pt reports urinary frequency, burning sensation, malodorous urine, and cloudiness x3 days. Denies abdominal or flank pain. No fevers. Meds taken: ibuprofen 

## 2024-02-04 ENCOUNTER — Ambulatory Visit (HOSPITAL_COMMUNITY): Payer: Self-pay

## 2024-02-04 LAB — URINE CULTURE: Culture: >=100000 — AB

## 2024-02-04 MED ORDER — CEFDINIR 300 MG PO CAPS: 300.0000 mg | ORAL_CAPSULE | Freq: Two times a day (BID) | ORAL | 0 refills | Status: AC

## 2024-02-04 NOTE — Telephone Encounter (Signed)
 RX sent for cefdinir  given urine culture results

## 2024-02-08 NOTE — ED Provider Notes (Signed)
 [facility]    CSN: [id number] Arrival date & time: 02/02/24  1621      History   Chief Complaint Chief Complaint  Patient presents with   Urinary Frequency   Dysuria    HPI Evan Leonard is a 63 y.o. male.   Patient here today for evaluation of urinary frequency, burning sensation and malodorous urine that started 3 days ago.  He also reports that his urine is cloudy.  He has not any abdominal pain or flank pain.  He denies fever.  He has taken ibuprofen  without resolution.  The history is provided by the patient.  Urinary Frequency Pertinent negatives include no shortness of breath.  Dysuria Presenting symptoms: dysuria   Associated symptoms: urinary frequency   Associated symptoms: no fever and no vomiting     Past Medical History:  Diagnosis Date   CAD (coronary artery disease)    Diabetes mellitus, type II (HCC) 02/2013   Diabetic ulcer of right foot due to type 2 diabetes mellitus (HCC) 03/21/2018   DVT (deep venous thrombosis) (HCC) 05/04/2012   LLE   Exertional dyspnea 05/04/2012   isolated episode (05/05/2012)   Hepatic steatosis    History of chickenpox    Hypertension 03/21/2018   Obesity, Class III, BMI 40-49.9 (morbid obesity) 03/21/2018   OSA on CPAP    Peripheral vascular disease (HCC)    varicose veins   Pulmonary embolism (HCC) 05/04/2012   bilaterally   Pyelonephritis 04/2017   Small bowel obstruction (HCC) 09/06/2015   Varicose veins     Patient Active Problem List   Diagnosis Date Noted   Acute swimmer's ear of left side 09/22/2023   Otalgia of both ears 09/22/2023   Hyperlipidemia associated with type 2 diabetes mellitus (HCC) 03/25/2022   Positive colorectal cancer screening using Cologuard test 08/09/2021   Acid indigestion 08/09/2021   Hematochezia 08/09/2021   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 acute cor pulmonale (HCC) 06/05/2021   Pressure injury of skin 02/25/2021   Chronic anticoagulation 02/19/2021    CAD (coronary artery disease) 01/09/2021   Eczema 12/27/2019   Erectile dysfunction 12/27/2019   History of small bowel obstruction 08/20/2018   History of diabetic ulcer of foot 03/21/2018   Non-cardiac chest pain 02/09/2018   Renal stone 05/12/2017   S/P repair of recurrent ventral hernia 04/26/2017   History of pulmonary embolism 07/16/2016   Chronic venous insufficiency 01/25/2015   Incarcerated ventral hernia 12/13/2013   Diabetes mellitus with hyperglycemia (HCC) 03/31/2013   Leukocytosis 03/23/2013   Hyponatremia 03/23/2013   Morbid obesity with body mass index of 40.0-44.9 in adult [facility]) 05/05/2012   Obstructive sleep apnea 05/05/2012   DVT of left distal popliteal vein 05/05/2012    Past Surgical History:  Procedure Laterality Date   BOWEL RESECTION N/A 02/20/2021   Procedure: SMALL BOWEL RESECTION;  Surgeon: Stevie Herlene Righter, MD;  Location: [facility] OR;  Service: General;  Laterality: N/A;   CORONARY STENT INTERVENTION N/A 01/02/2021   Procedure: CORONARY STENT INTERVENTION;  Surgeon: Dann Candyce RAMAN, MD;  Location: [facility] INVASIVE CV LAB;  Service: Cardiovascular;  Laterality: N/A;   CYSTOSCOPY W/ URETERAL STENT PLACEMENT Right 05/12/2017   Procedure: CYSTOSCOPY WITH RETROGRADE PYELOGRAM/URETERAL STENT PLACEMENT;  Surgeon: Watt Rush, MD;  Location: [facility] OR;  Service: Urology;  Laterality: Right;   CYSTOSCOPY/URETEROSCOPY/HOLMIUM LASER/STENT PLACEMENT Right 07/29/2017   Procedure: CYSTOSCOPY RIGHT URETEROSCOPY HOLMIUM LASER STENT EXCHANGE;  Surgeon: Watt Rush, MD;  Location: [facility] ORS;  Service: Urology;  Laterality: Right;  HERNIA REPAIR     2015   LAPAROTOMY N/A 12/13/2013   Procedure: EXPLORATORY LAPAROTOMY Repair ventral hernia, without mesh, partial omentectomy;  Surgeon: Lynwood MALVA Pina, MD;  Location: [facility] OR;  Service: General;  Laterality: N/A;   LEFT HEART CATH AND CORONARY ANGIOGRAPHY N/A 01/02/2021   Procedure: LEFT HEART CATH AND CORONARY ANGIOGRAPHY;  Surgeon:  Dann Candyce RAMAN, MD;  Location: [facility] INVASIVE CV LAB;  Service: Cardiovascular;  Laterality: N/A;   LYSIS OF ADHESION  02/20/2021   Procedure: LYSIS OF ADHESION;  Surgeon: Stevie, Herlene Righter, MD;  Location: [facility] OR;  Service: General;;   VENTRAL HERNIA REPAIR N/A 04/26/2017   Procedure: INCARCERATED HERNIA REPAIR VENTRAL ADULT;  Surgeon: Sebastian Moles, MD;  Location: [facility] OR;  Service: General;  Laterality: N/A;   VENTRAL HERNIA REPAIR N/A 02/20/2021   Procedure: EXPLORATORY LAPAROTOMY; RECURRENT HERNIA REPAIR WITH OVERLAY MESH;  Surgeon: Kinsinger, Herlene Righter, MD;  Location: [facility] OR;  Service: General;  Laterality: N/A;       Home Medications    Prior to Admission medications   Medication Sig Start Date End Date Taking? Authorizing Provider  ACCU-CHEK GUIDE test strip USE AS DIRECTED UP TO 4 TIMES DAILY Patient taking differently: 1 each by Other route 4 (four) times daily. 12/30/19  Yes Katrinka Garnette MALVA, MD  acetaminophen  (TYLENOL ) 500 MG tablet Take 500-1,000 mg by mouth every 6 (six) hours as needed for mild pain or headache.   Yes [provider]  aspirin  EC 81 MG tablet Take 1 tablet (81 mg total) by mouth daily. Swallow whole. 02/18/22  Yes Verlin Lonni BIRCH, MD  atorvastatin  (LIPITOR ) 80 MG tablet TAKE 1 TABLET BY MOUTH EVERY DAY 12/11/23  Yes Katrinka Garnette MALVA, MD  ELIQUIS  5 MG TABS tablet TAKE 1 TABLET BY MOUTH TWICE A DAY 11/13/23  Yes Katrinka Garnette MALVA, MD  metFORMIN  (GLUCOPHAGE -XR) 500 MG 24 hr tablet TAKE 2 TABLETS (1,000 MG TOTAL) BY MOUTH IN THE MORNING AND AT BEDTIME. 02/02/24  Yes Katrinka Garnette MALVA, MD  metoprolol  succinate (TOPROL -XL) 50 MG 24 hr tablet TAKE 1 TABLET BY MOUTH EVERY DAY 12/11/23  Yes Katrinka Garnette MALVA, MD  Multiple Vitamins-Minerals (CENTRUM SILVER  50+MEN) TABS Take 1 tablet by mouth daily with breakfast.   Yes [provider]  pantoprazole  (PROTONIX ) 40 MG tablet Take 1 tablet (40 mg total) by mouth daily. 05/26/23  Yes Katrinka Garnette MALVA,  MD  PRESCRIPTION MEDICATION Inhale 1 Device into the lungs See admin instructions. CPAP- At bedtime every night   Yes [provider]  Semaglutide , 1 MG/DOSE, 4 MG/3ML SOPN Inject 1 mg as directed once a week. 05/26/23  Yes Katrinka Garnette MALVA, MD  Vitamin D , Ergocalciferol , (DRISDOL ) 1.25 MG (50000 UNIT) CAPS capsule TAKE 1 CAPSULE (50,000 UNITS TOTAL) BY MOUTH EVERY 7 (SEVEN) DAYS 12/12/23  Yes Katrinka Garnette MALVA, MD  cefdinir  (OMNICEF ) 300 MG capsule Take 1 capsule (300 mg total) by mouth 2 (two) times daily for 7 days. 02/04/24 02/11/24  Hermanns, Ashlee P, PA-C  ciprofloxacin  (CIPRO ) 500 MG tablet Take 1 tablet (500 mg total) by mouth every 12 (twelve) hours. Patient not taking: Reported on 02/02/2024 11/22/23   Billy Asberry FALCON, PA-C  fluticasone  (FLONASE ) 50 MCG/ACT nasal spray Place 2 sprays into both nostrils daily. Patient not taking: Reported on 02/02/2024 09/17/23   Dreama, [place]  N, FNP  nitroGLYCERIN  (NITROSTAT ) 0.4 MG SL tablet Place 1 tablet (0.4 mg total) under the tongue every 5 (five) minutes as needed for chest  pain. 05/26/23   Katrinka Garnette KIDD, MD  sildenafil  (VIAGRA ) 100 MG tablet Take 1 tablet (100 mg total) by mouth daily as needed for erectile dysfunction (do not take nitroglycerin  within 24 hours of use). 01/06/23   Katrinka Garnette KIDD, MD    Family History Family History  Problem Relation Age of Onset   Diabetes Mother        died age 77- also bleeding ulcer   Hypertension Mother    Heart failure Father        in his 22s (patient was 80). day before surgery planned   Other Maternal Grandmother        states natural causes all grandparents   Colon cancer Neg Hx    Esophageal cancer Neg Hx    Inflammatory bowel disease Neg Hx    Liver disease Neg Hx    Pancreatic cancer Neg Hx    Rectal cancer Neg Hx    Stomach cancer Neg Hx     Social History Social History   Tobacco Use   Smoking status: Never   Smokeless tobacco: Never  Vaping Use   Vaping status:  Never Used  Substance Use Topics   Alcohol use: No    Alcohol/week: 0.0 standard drinks of alcohol   Drug use: No     Allergies   Patient has no known allergies.   Review of Systems Review of Systems  Constitutional:  Negative for chills and fever.  Eyes:  Negative for discharge and redness.  Respiratory:  Negative for shortness of breath.   Gastrointestinal:  Negative for vomiting.  Genitourinary:  Positive for dysuria and frequency.  Neurological:  Negative for numbness.     Physical Exam Triage Vital Signs ED Triage Vitals [02/02/24 1656]  Encounter Vitals Group     BP (!) 144/86     Girls Systolic BP Percentile      Girls Diastolic BP Percentile      Boys Systolic BP Percentile      Boys Diastolic BP Percentile      Pulse Rate 83     Resp 18     Temp 98.5 F (36.9 C)     Temp Source Oral     SpO2 94 %     Weight      Height      Head Circumference      Peak Flow      Pain Score 0     Pain Loc      Pain Education      Exclude from Growth Chart    No data found.  Updated Vital Signs BP (!) 144/86 (BP Location: Left Arm)   Pulse 83   Temp 98.5 F (36.9 C) (Oral)   Resp 18   SpO2 94%   Visual Acuity Right Eye Distance:   Left Eye Distance:   Bilateral Distance:    Right Eye Near:   Left Eye Near:    Bilateral Near:     Physical Exam Vitals and nursing note reviewed.  Constitutional:      General: He is not in acute distress.    Appearance: Normal appearance. He is not ill-appearing.  HENT:     Head: Normocephalic and atraumatic.  Eyes:     Conjunctiva/sclera: Conjunctivae normal.  Cardiovascular:     Rate and Rhythm: Normal rate.  Pulmonary:     Effort: Pulmonary effort is normal. No respiratory distress.  Neurological:     Mental Status: He is alert.  Psychiatric:  Mood and Affect: Mood normal.        Behavior: Behavior normal.        Thought Content: Thought content normal.      UC Treatments / Results  Labs (all labs  ordered are listed, but only abnormal results are displayed) Labs Reviewed  URINE CULTURE - Abnormal; Notable for the following components:      Result Value   Culture >=100,000 COLONIES/mL STAPHYLOCOCCUS EPIDERMIDIS (*)    Organism ID, Bacteria STAPHYLOCOCCUS EPIDERMIDIS (*)    All other components within normal limits  POCT URINE DIPSTICK - Abnormal; Notable for the following components:   Color, UA light yellow (*)    Clarity, UA cloudy (*)    Glucose, UA =100 (*)    Blood, UA trace-intact (*)    POC PROTEIN,UA =30 (*)    All other components within normal limits  GLUCOSE, POCT (MANUAL RESULT ENTRY) - Abnormal; Notable for the following components:   POCT Glucose ([facility]) 171 (*)    All other components within normal limits    EKG   Radiology No results found.  Procedures Procedures (including critical care time)  Medications Ordered in UC Medications - No data to display  Initial Impression / Assessment and Plan / UC Course  I have reviewed the triage vital signs and the nursing notes.  Pertinent labs & imaging results that were available during my care of the patient were reviewed by me and considered in my medical decision making (see chart for details).    Given frequency and known diabetes glucose checked in office, mildly elevated but not significantly so.  Advised to continue to monitor.  UA without signs of UTI.  Will await results of urine culture for further recommendation.  Final Clinical Impressions(s) / UC Diagnoses   Final diagnoses:  Dysuria  Type 2 diabetes mellitus with hyperglycemia, without long-term current use of insulin  [facility])   Discharge Instructions   None    ED Prescriptions   None    PDMP not reviewed this encounter.   Billy Asberry FALCON, PA-C 02/08/24 1347

## 2024-02-28 ENCOUNTER — Ambulatory Visit
Admission: EM | Admit: 2024-02-28 | Discharge: 2024-02-28 | Disposition: A | Discharge status: Home / Self Care | Payer: PRIVATE HEALTH INSURANCE | Attending: Nurse Practitioner | Admitting: Nurse Practitioner

## 2024-02-28 ENCOUNTER — Encounter: Payer: Self-pay | Admitting: Emergency Medicine

## 2024-02-28 DIAGNOSIS — N3001 Acute cystitis with hematuria: Secondary | ICD-10-CM | POA: Diagnosis present

## 2024-02-28 LAB — POCT URINE DIPSTICK
Bilirubin, UA: NEGATIVE
Glucose, UA: NEGATIVE mg/dL
Ketones, POC UA: NEGATIVE mg/dL
Nitrite, UA: POSITIVE — AB
POC PROTEIN,UA: >=300 — AB
Spec Grav, UA: 1.025 (ref 1.010–1.025)
Urobilinogen, UA: 0.2 U/dL
pH, UA: 6 (ref 5.0–8.0)

## 2024-02-28 MED ORDER — CIPROFLOXACIN HCL 500 MG PO TABS: 500.0000 mg | ORAL_TABLET | Freq: Two times a day (BID) | ORAL | 0 refills | Status: DC

## 2024-02-28 MED ORDER — PHENAZOPYRIDINE HCL 200 MG PO TABS: 200.0000 mg | ORAL_TABLET | ORAL | 0 refills | Status: AC

## 2024-02-28 MED ORDER — TAMSULOSIN HCL 0.4 MG PO CAPS: 0.4000 mg | ORAL_CAPSULE | Freq: Every day | ORAL | 0 refills | Status: DC

## 2024-02-28 NOTE — ED Provider Notes (Signed)
 [facility]    CSN: [id number] Arrival date & time: 02/28/24  1359      History   Chief Complaint Chief Complaint  Patient presents with   Possible UTI    HPI Evan Leonard is a 63 y.o. male.   Discussed the use of AI scribe software for clinical note transcription with the patient, who gave verbal consent to proceed.   The patient presents with recurrent urinary tract infection symptoms. He was previously seen on the 8th for similar symptoms and was treated with cefdinir . His symptoms initially eased up, but approximately 3 days ago they started again. Currently, he reports cloudy urine, urinary frequency with decreased output, decreased urinary stream, and urinary odor. His symptoms are slightly worse compared to his last episode. He experienced a little chill last night but denies fever today. He denies nausea, vomiting, discharge, or blood in urine. He also denies any prostate issues. He mentions that he used to take Cipro  in the past and believes it was stronger than cefdinir .    Past Medical History:  Diagnosis Date   CAD (coronary artery disease)    Diabetes mellitus, type II (HCC) 02/2013   Diabetic ulcer of right foot due to type 2 diabetes mellitus (HCC) 03/21/2018   DVT (deep venous thrombosis) (HCC) 05/04/2012   LLE   Exertional dyspnea 05/04/2012   isolated episode (05/05/2012)   Hepatic steatosis    History of chickenpox    Hypertension 03/21/2018   Obesity, Class III, BMI 40-49.9 (morbid obesity) (HCC) 03/21/2018   OSA on CPAP    Peripheral vascular disease    varicose veins   Pulmonary embolism (HCC) 05/04/2012   bilaterally   Pyelonephritis 04/2017   Small bowel obstruction (HCC) 09/06/2015   Varicose veins     Patient Active Problem List   Diagnosis Date Noted   Acute swimmer's ear of left side 09/22/2023   Otalgia of both ears 09/22/2023   Hyperlipidemia associated with type 2 diabetes mellitus (HCC) 03/25/2022   Positive  colorectal cancer screening using Cologuard test 08/09/2021   Acid indigestion 08/09/2021   Hematochezia 08/09/2021   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 acute cor pulmonale (HCC) 06/05/2021   Pressure injury of skin 02/25/2021   Chronic anticoagulation 02/19/2021   CAD (coronary artery disease) 01/09/2021   Eczema 12/27/2019   Erectile dysfunction 12/27/2019   History of small bowel obstruction 08/20/2018   History of diabetic ulcer of foot 03/21/2018   Non-cardiac chest pain 02/09/2018   Renal stone 05/12/2017   S/P repair of recurrent ventral hernia 04/26/2017   History of pulmonary embolism 07/16/2016   Chronic venous insufficiency 01/25/2015   Incarcerated ventral hernia 12/13/2013   Diabetes mellitus with hyperglycemia (HCC) 03/31/2013   Leukocytosis 03/23/2013   Hyponatremia 03/23/2013   Morbid obesity with body mass index of 40.0-44.9 in adult [facility]) 05/05/2012   Obstructive sleep apnea 05/05/2012   DVT of left distal popliteal vein 05/05/2012    Past Surgical History:  Procedure Laterality Date   BOWEL RESECTION N/A 02/20/2021   Procedure: SMALL BOWEL RESECTION;  Surgeon: Stevie Herlene Righter, MD;  Location: [facility] OR;  Service: General;  Laterality: N/A;   CORONARY STENT INTERVENTION N/A 01/02/2021   Procedure: CORONARY STENT INTERVENTION;  Surgeon: Dann Candyce RAMAN, MD;  Location: [facility] INVASIVE CV LAB;  Service: Cardiovascular;  Laterality: N/A;   CYSTOSCOPY W/ URETERAL STENT PLACEMENT Right 05/12/2017   Procedure: CYSTOSCOPY WITH RETROGRADE PYELOGRAM/URETERAL STENT PLACEMENT;  Surgeon: Watt Rush, MD;  Location: [facility]  OR;  Service: Urology;  Laterality: Right;   CYSTOSCOPY/URETEROSCOPY/HOLMIUM LASER/STENT PLACEMENT Right 07/29/2017   Procedure: CYSTOSCOPY RIGHT URETEROSCOPY HOLMIUM LASER STENT EXCHANGE;  Surgeon: Watt Rush, MD;  Location: [facility] ORS;  Service: Urology;  Laterality: Right;   HERNIA REPAIR     2015   LAPAROTOMY N/A 12/13/2013   Procedure: EXPLORATORY  LAPAROTOMY Repair ventral hernia, without mesh, partial omentectomy;  Surgeon: Lynwood MALVA Pina, MD;  Location: [facility] OR;  Service: General;  Laterality: N/A;   LEFT HEART CATH AND CORONARY ANGIOGRAPHY N/A 01/02/2021   Procedure: LEFT HEART CATH AND CORONARY ANGIOGRAPHY;  Surgeon: Dann Candyce RAMAN, MD;  Location: [facility] INVASIVE CV LAB;  Service: Cardiovascular;  Laterality: N/A;   LYSIS OF ADHESION  02/20/2021   Procedure: LYSIS OF ADHESION;  Surgeon: Stevie, Herlene Righter, MD;  Location: [facility] OR;  Service: General;;   VENTRAL HERNIA REPAIR N/A 04/26/2017   Procedure: INCARCERATED HERNIA REPAIR VENTRAL ADULT;  Surgeon: Sebastian Moles, MD;  Location: [facility] OR;  Service: General;  Laterality: N/A;   VENTRAL HERNIA REPAIR N/A 02/20/2021   Procedure: EXPLORATORY LAPAROTOMY; RECURRENT HERNIA REPAIR WITH OVERLAY MESH;  Surgeon: Kinsinger, Herlene Righter, MD;  Location: [facility] OR;  Service: General;  Laterality: N/A;       Home Medications    Prior to Admission medications   Medication Sig Start Date End Date Taking? Authorizing Provider  ciprofloxacin  (CIPRO ) 500 MG tablet Take 1 tablet (500 mg total) by mouth 2 (two) times daily. 02/28/24  Yes Iola Lukes, FNP  metFORMIN  (GLUCOPHAGE -XR) 500 MG 24 hr tablet TAKE 2 TABLETS (1,000 MG TOTAL) BY MOUTH IN THE MORNING AND AT BEDTIME. 02/02/24  Yes Katrinka Garnette MALVA, MD  phenazopyridine (PYRIDIUM) 200 MG tablet Take 1 tablet (200 mg total) by mouth 3 (three) times daily at 8am, 3pm and bedtime for 2 days. 02/28/24 03/01/24 Yes Iola Lukes, FNP  tamsulosin (FLOMAX) 0.4 MG CAPS capsule Take 1 capsule (0.4 mg total) by mouth daily. 02/28/24  Yes Jivan Symanski, Lukes, FNP  ACCU-CHEK GUIDE test strip USE AS DIRECTED UP TO 4 TIMES DAILY Patient taking differently: 1 each by Other route 4 (four) times daily. 12/30/19   Katrinka Garnette MALVA, MD  acetaminophen  (TYLENOL ) 500 MG tablet Take 500-1,000 mg by mouth every 6 (six) hours as needed for mild pain or headache.    [provider]  aspirin  EC 81 MG tablet Take 1 tablet (81 mg total) by mouth daily. Swallow whole. 02/18/22   Verlin Lonni BIRCH, MD  atorvastatin  (LIPITOR ) 80 MG tablet TAKE 1 TABLET BY MOUTH EVERY DAY 12/11/23   Katrinka Garnette MALVA, MD  ELIQUIS  5 MG TABS tablet TAKE 1 TABLET BY MOUTH TWICE A DAY 11/13/23   Katrinka Garnette MALVA, MD  fluticasone  (FLONASE ) 50 MCG/ACT nasal spray Place 2 sprays into both nostrils daily. Patient not taking: Reported on 02/02/2024 09/17/23   Dreama, [place]  N, FNP  metoprolol  succinate (TOPROL -XL) 50 MG 24 hr tablet TAKE 1 TABLET BY MOUTH EVERY DAY 12/11/23   Katrinka Garnette MALVA, MD  Multiple Vitamins-Minerals (CENTRUM SILVER  50+MEN) TABS Take 1 tablet by mouth daily with breakfast.    [provider]  nitroGLYCERIN  (NITROSTAT ) 0.4 MG SL tablet Place 1 tablet (0.4 mg total) under the tongue every 5 (five) minutes as needed for chest pain. 05/26/23   Katrinka Garnette MALVA, MD  pantoprazole  (PROTONIX ) 40 MG tablet Take 1 tablet (40 mg total) by mouth daily. 05/26/23   Katrinka Garnette MALVA, MD  PRESCRIPTION MEDICATION Inhale 1 Device into  the lungs See admin instructions. CPAP- At bedtime every night    [provider]  Semaglutide , 1 MG/DOSE, 4 MG/3ML SOPN Inject 1 mg as directed once a week. 05/26/23   Katrinka Garnette KIDD, MD  sildenafil  (VIAGRA ) 100 MG tablet Take 1 tablet (100 mg total) by mouth daily as needed for erectile dysfunction (do not take nitroglycerin  within 24 hours of use). 01/06/23   Katrinka Garnette KIDD, MD  Vitamin D , Ergocalciferol , (DRISDOL ) 1.25 MG (50000 UNIT) CAPS capsule TAKE 1 CAPSULE (50,000 UNITS TOTAL) BY MOUTH EVERY 7 (SEVEN) DAYS 12/12/23   Katrinka Garnette KIDD, MD    Family History Family History  Problem Relation Age of Onset   Diabetes Mother        died age 41- also bleeding ulcer   Hypertension Mother    Heart failure Father        in his 50s (patient was 44). day before surgery planned   Other Maternal Grandmother        states  natural causes all grandparents   Colon cancer Neg Hx    Esophageal cancer Neg Hx    Inflammatory bowel disease Neg Hx    Liver disease Neg Hx    Pancreatic cancer Neg Hx    Rectal cancer Neg Hx    Stomach cancer Neg Hx     Social History Social History   Tobacco Use   Smoking status: Never    Passive exposure: Never   Smokeless tobacco: Never  Vaping Use   Vaping status: Never Used  Substance Use Topics   Alcohol use: No    Alcohol/week: 0.0 standard drinks of alcohol   Drug use: No     Allergies   Patient has no known allergies.   Review of Systems Review of Systems  Constitutional:  Positive for chills. Negative for fever.  Gastrointestinal:  Negative for abdominal pain, nausea and vomiting.  Genitourinary:  Positive for decreased urine volume, dysuria and frequency. Negative for hematuria, penile discharge, penile pain, penile swelling, scrotal swelling and testicular pain.       Decreased urinary stream. Odor to urine. Cloudy urine.   All other systems reviewed and are negative.    Physical Exam Triage Vital Signs ED Triage Vitals  Encounter Vitals Group     BP 02/28/24 1451 (!) 157/82     Girls Systolic BP Percentile --      Girls Diastolic BP Percentile --      Boys Systolic BP Percentile --      Boys Diastolic BP Percentile --      Pulse Rate 02/28/24 1451 82     Resp 02/28/24 1451 18     Temp 02/28/24 1451 98 F (36.7 C)     Temp Source 02/28/24 1451 Oral     SpO2 02/28/24 1451 97 %     Weight 02/28/24 1450 (!) 341 lb 4.4 oz (154.8 kg)     Height --      Head Circumference --      Peak Flow --      Pain Score 02/28/24 1449 2     Pain Loc --      Pain Education --      Exclude from Growth Chart --    No data found.  Updated Vital Signs BP (!) 157/82 (BP Location: Left Arm)   Pulse 82   Temp 98 F (36.7 C) (Oral)   Resp 18   Wt (!) 341 lb 4.4 oz (154.8 kg)  SpO2 97%   BMI 44.72 kg/m   Visual Acuity Right Eye Distance:   Left Eye  Distance:   Bilateral Distance:    Right Eye Near:   Left Eye Near:    Bilateral Near:     Physical Exam Constitutional:      General: He is not in acute distress.    Appearance: Normal appearance. He is morbidly obese. He is not ill-appearing, toxic-appearing or diaphoretic.  HENT:     Head: Normocephalic.     Nose: Nose normal.     Mouth/Throat:     Mouth: Mucous membranes are moist.  Eyes:     Conjunctiva/sclera: Conjunctivae normal.  Cardiovascular:     Rate and Rhythm: Normal rate.  Pulmonary:     Effort: Pulmonary effort is normal.  Abdominal:     Palpations: Abdomen is soft.  Musculoskeletal:        General: Normal range of motion.     Cervical back: Normal range of motion and neck supple.  Skin:    General: Skin is warm and dry.  Neurological:     General: No focal deficit present.     Mental Status: He is alert and oriented to person, place, and time.  Psychiatric:        Mood and Affect: Mood normal.        Behavior: Behavior normal.      UC Treatments / Results  Labs (all labs ordered are listed, but only abnormal results are displayed) Labs Reviewed  POCT URINE DIPSTICK - Abnormal; Notable for the following components:      Result Value   Clarity, UA turbid (*)    Blood, UA large (*)    POC PROTEIN,UA >=300 (*)    Nitrite, UA Positive (*)    Leukocytes, UA Large (3+) (*)    All other components within normal limits    EKG   Radiology No results found.  Procedures Procedures (including critical care time)  Medications Ordered in UC Medications - No data to display  Initial Impression / Assessment and Plan / UC Course  I have reviewed the triage vital signs and the nursing notes.  Pertinent labs & imaging results that were available during my care of the patient were reviewed by me and considered in my medical decision making (see chart for details).   The patient presents with dysuria, urinary urgency, bladder pressure, and decreased  urination, consistent with a urinary tract infection. Ciprofloxacin  was prescribed for antimicrobial therapy, Pyridium was provided for symptomatic relief of burning and urinary discomfort, and Flomax was added to help maximize urine flow given reported decreased urination. The patient was counseled on proper use of medications, including the expected side effect of orange discoloration of urine from Pyridium. A urine culture was sent to confirm the causative organism and verify antibiotic appropriateness, and results will be communicated only if abnormal; otherwise, they may be accessed via MyChart. Supportive recommendations included maintaining good hydration with clear fluids, avoiding bladder irritants such as caffeine and alcohol, and urinating regularly without prolonged retention. The patient was instructed to follow up with their primary care provider if symptoms persist beyond a few days, worsen, or recur after treatment, and to seek urgent or emergency evaluation if fever, flank pain, vomiting, or systemic symptoms develop.  Today's evaluation has revealed no signs of a dangerous process. Discussed diagnosis with patient and/or guardian. Patient and/or guardian aware of their diagnosis, possible red flag symptoms to watch out for and need for  close follow up. Patient and/or guardian understands verbal and written discharge instructions. Patient and/or guardian comfortable with plan and disposition.  Patient and/or guardian has a clear mental status at this time, good insight into illness (after discussion and teaching) and has clear judgment to make decisions regarding their care  Documentation was completed with the aid of voice recognition software. Transcription may contain typographical errors.   Final Clinical Impressions(s) / UC Diagnoses   Final diagnoses:  Acute cystitis with hematuria   Discharge Instructions   None    ED Prescriptions     Medication Sig Dispense Auth. Provider    ciprofloxacin  (CIPRO ) 500 MG tablet Take 1 tablet (500 mg total) by mouth 2 (two) times daily. 14 tablet Iola Lukes, FNP   phenazopyridine (PYRIDIUM) 200 MG tablet Take 1 tablet (200 mg total) by mouth 3 (three) times daily at 8am, 3pm and bedtime for 2 days. 6 tablet Iola Lukes, FNP   tamsulosin (FLOMAX) 0.4 MG CAPS capsule Take 1 capsule (0.4 mg total) by mouth daily. 10 capsule Iola Lukes, FNP      PDMP not reviewed this encounter.

## 2024-02-28 NOTE — ED Triage Notes (Signed)
 Pt presents c/o UTI sxs x 3 days. Pt reports he was treated on 02/02/24 for UTI with Cefdinir .  Pt doesn't feel like the meds worked due to the UTI sxs returning. Sxs pt is experiencing is frequent urination but not producing very much urine, cloudy urine, strong odor.

## 2024-02-28 NOTE — Discharge Instructions (Addendum)
 You were seen today for symptoms consistent with a urinary tract infection (UTI). You have been prescribed Cipro  to treat the infection, Pyridium to help relieve discomfort such as burning, urgency, bladder pressure and Flomax once daily to maximize urine flow due to decreased urination. Take the medications exactly as prescribed and complete the full course, even if you start feeling better. Pyridium  may cause your urine to change to an orange color, which is a normal side effect of the medication. A urine culture has been sent to identify the specific bacteria causing the infection and to confirm that the prescribed antibiotic is appropriate. You will only be contacted if your results are abnormal; otherwise, you may review them in your MyChart account. It is important to stay well hydrated by drinking plenty of fluids throughout the day. This helps flush out your urinary system and keeps your urine light yellow, which is a sign of good hydration. Avoid caffeine and alcohol, as they can irritate the bladder. Be sure to urinate regularly and empty your bladder fully. Do not hold your urine for extended periods. Follow up with your healthcare provider if your symptoms do not improve within a few days, get worse, or return after completing your treatment.

## 2024-03-01 ENCOUNTER — Ambulatory Visit (HOSPITAL_COMMUNITY): Payer: Self-pay

## 2024-03-01 LAB — URINE CULTURE: Culture: >=100000 — AB

## 2024-03-09 ENCOUNTER — Other Ambulatory Visit: Payer: Self-pay | Admitting: Family Medicine

## 2024-03-14 ENCOUNTER — Other Ambulatory Visit: Payer: Self-pay | Admitting: Family Medicine

## 2024-03-15 ENCOUNTER — Ambulatory Visit: Payer: PRIVATE HEALTH INSURANCE | Admitting: Family Medicine

## 2024-03-25 ENCOUNTER — Ambulatory Visit: Payer: PRIVATE HEALTH INSURANCE | Admitting: Family Medicine

## 2024-03-25 ENCOUNTER — Encounter: Payer: Self-pay | Admitting: Family Medicine

## 2024-03-25 VITALS — BP 132/80 | HR 73 | Temp 96.3°F | Ht 73.25 in | Wt 340.8 lb

## 2024-03-25 DIAGNOSIS — E1169 Type 2 diabetes mellitus with other specified complication: Secondary | ICD-10-CM | POA: Diagnosis not present

## 2024-03-25 DIAGNOSIS — E785 Hyperlipidemia, unspecified: Secondary | ICD-10-CM | POA: Diagnosis not present

## 2024-03-25 DIAGNOSIS — E1165 Type 2 diabetes mellitus with hyperglycemia: Secondary | ICD-10-CM

## 2024-03-25 DIAGNOSIS — Z23 Encounter for immunization: Secondary | ICD-10-CM

## 2024-03-25 DIAGNOSIS — Z7984 Long term (current) use of oral hypoglycemic drugs: Secondary | ICD-10-CM

## 2024-03-25 DIAGNOSIS — E538 Deficiency of other specified B group vitamins: Secondary | ICD-10-CM

## 2024-03-25 DIAGNOSIS — Z125 Encounter for screening for malignant neoplasm of prostate: Secondary | ICD-10-CM

## 2024-03-25 DIAGNOSIS — E11621 Type 2 diabetes mellitus with foot ulcer: Secondary | ICD-10-CM

## 2024-03-25 DIAGNOSIS — L97411 Non-pressure chronic ulcer of right heel and midfoot limited to breakdown of skin: Secondary | ICD-10-CM

## 2024-03-25 MED ORDER — GLUCOSE BLOOD VI STRP: ORAL_STRIP | 12 refills | Status: AC

## 2024-03-25 MED ORDER — FREESTYLE LIBRE 3 PLUS SENSOR MISC: 5 refills | Status: AC

## 2024-03-25 MED ORDER — OZEMPIC (0.25 OR 0.5 MG/DOSE) 2 MG/3ML ~~LOC~~ SOPN: PEN_INJECTOR | SUBCUTANEOUS | 0 refills | Status: AC

## 2024-03-25 NOTE — Progress Notes (Signed)
 Phone [phone] In person visit   Subjective:   Evan Leonard is a 63 y.o. year old very pleasant male patient who presents for/with See problem oriented charting Chief Complaint  Patient presents with   Diabetes   Fatigue    Constant fatigue;    Leg Injury    Ulcers on right leg that are weeping; has been putting silver  gel on them and washes daily;     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 acute cor pulmonale (HCC) 06/05/2021    Priority: High   CAD (coronary artery disease) 01/09/2021    Priority: High   Non-cardiac chest pain 02/09/2018    Priority: High   History of pulmonary embolism 07/16/2016    Priority: High   Diabetes mellitus with hyperglycemia (HCC) 03/31/2013    Priority: High   Morbid obesity with body mass index of 40.0-44.9 in adult [facility]) 05/05/2012    Priority: High   DVT of left distal popliteal vein 05/05/2012    Priority: High   Hyperlipidemia associated with type 2 diabetes mellitus (HCC) 03/25/2022    Priority: Medium    Acid indigestion 08/09/2021    Priority: Medium    History of small bowel obstruction 08/20/2018    Priority: Medium    History of diabetic ulcer of foot 03/21/2018    Priority: Medium    S/P repair of recurrent ventral hernia 04/26/2017    Priority: Medium    Chronic venous insufficiency 01/25/2015    Priority: Medium    Obstructive sleep apnea 05/05/2012    Priority: Medium    Eczema 12/27/2019    Priority: Low   Erectile dysfunction 12/27/2019    Priority: Low   Renal stone 05/12/2017    Priority: Low   Hyponatremia 03/23/2013    Priority: Low   Acute swimmer's ear of left side 09/22/2023   Otalgia of both ears 09/22/2023   Positive colorectal cancer screening using Cologuard test 08/09/2021   Hematochezia 08/09/2021   Pressure injury of skin 02/25/2021   Chronic anticoagulation 02/19/2021   Incarcerated ventral hernia  12/13/2013   Leukocytosis 03/23/2013    Medications- reviewed and updated Current Outpatient Medications  Medication Sig Dispense Refill   ACCU-CHEK GUIDE test strip USE AS DIRECTED UP TO 4 TIMES DAILY (Patient taking differently: 1 each by Other route 4 (four) times daily.) 100 strip 3   acetaminophen  (TYLENOL ) 500 MG tablet Take 500-1,000 mg by mouth every 6 (six) hours as needed for mild pain or headache.     aspirin  EC 81 MG tablet Take 1 tablet (81 mg total) by mouth daily. Swallow whole. 90 tablet 3   atorvastatin  (LIPITOR ) 80 MG tablet TAKE 1 TABLET BY MOUTH EVERY DAY 90 tablet 1   ELIQUIS  5 MG TABS tablet TAKE 1 TABLET BY MOUTH TWICE A DAY 60 tablet 5   fluticasone  (FLONASE ) 50 MCG/ACT nasal spray Place 2 sprays into both nostrils daily. 9.9 mL 0   metFORMIN  (GLUCOPHAGE -XR) 500 MG 24 hr tablet TAKE 2 TABLETS (1,000 MG TOTAL) BY MOUTH IN THE MORNING AND AT BEDTIME. 360 tablet 1   metoprolol  succinate (TOPROL -XL) 50 MG 24 hr tablet TAKE 1 TABLET BY MOUTH EVERY DAY 90 tablet 3   Multiple Vitamins-Minerals (CENTRUM SILVER  50+MEN) TABS Take 1 tablet by mouth daily with breakfast.     nitroGLYCERIN  (NITROSTAT ) 0.4 MG SL tablet Place 1 tablet (0.4 mg total) under the tongue every 5 (  five) minutes as needed for chest pain. 25 tablet 1   pantoprazole  (PROTONIX ) 40 MG tablet TAKE 1 TABLET BY MOUTH EVERY DAY 90 tablet 3   PRESCRIPTION MEDICATION Inhale 1 Device into the lungs See admin instructions. CPAP- At bedtime every night     Semaglutide ,0.25 or 0.5MG /DOS, (OZEMPIC , 0.25 OR 0.5 MG/DOSE,) 2 MG/3ML SOPN Inject 0.25 mg into the skin once a week for 28 days, THEN 0.5 mg once a week for 28 days. 6 mL 0   sildenafil  (VIAGRA ) 100 MG tablet Take 1 tablet (100 mg total) by mouth daily as needed for erectile dysfunction (do not take nitroglycerin  within 24 hours of use). 10 tablet 5   Vitamin D , Ergocalciferol , (DRISDOL ) 1.25 MG (50000 UNIT) CAPS capsule TAKE 1 CAPSULE (50,000 UNITS TOTAL) BY MOUTH  EVERY 7 (SEVEN) DAYS (Patient not taking: Reported on 03/25/2024) 12 capsule 0   No current facility-administered medications for this visit.     Objective:  BP 132/80 (BP Location: Left Arm, Patient Position: Sitting, Cuff Size: Normal)   Pulse 73   Temp (!) 96.3 F (35.7 C) (Temporal)   Ht 6' 1.25 (1.861 m)   Wt (!) 340 lb 12.8 oz (154.6 kg)   SpO2 90%   BMI 44.66 kg/m  Gen: NAD, resting comfortably CV: RRR no murmurs rubs or gallops Lungs: CTAB no crackles, wheeze, rhonchi Ext: 1+ edema worse on right, wound on midfoot actively weeping clear fluid- larger ulcer on right shin Skin: warm, dry     Assessment and Plan   #diabetic foot wound- on- right lateral foot midfoot- he is using ace sleeve under compression sleeve and its helping but has needed wound care in past and will send back  # Elevated PSA S:Over 3 years PSA increased about 0.8 but more substantial increase from 2023 2024. We were planning on repeat at next visit but we had hoped for closer visits -Has had 3 UTIs in the last 6 months treated at urgent care but apparently one was not treated with right antibiotic(s)  -At most recent visit was given 10 days of tamsulosin  -has noted a fair amount of fatigue in general but promotion and working a lot of hours Lab Results  Component Value Date   PSA 1.81 05/26/2023   PSA 0.84 11/23/2021   PSA 1.1 12/27/2019  A/P: PSA trending up- update today   #CAD-NSTEMI in August 2022 secondary to severe mid RCA stenosis. This was treated with a drug eluting stent. LV function normal by echo. No chest pain. Plavix  until August 2023 at--> now on ASA only. He is also on Eliquis .  #hyperlipidemia S: Medication:Atorvastatin  80 mg, eliquis  5 mg twice daily + asa 81 mg- Nitroglycerin  on hand- not needing  -Also on metoprolol  50 mg extended release  No chest pain or shortness of breath  Lab Results  Component Value Date   CHOL 124 05/26/2023   HDL 40.40 05/26/2023   LDLCALC 59  05/26/2023   LDLDIRECT 67.0 03/25/2022   TRIG 123.0 05/26/2023   CHOLHDL 3 05/26/2023  A/P: coronary artery disease appears asymptomatic - lipids at goal last year and since updating labs will check today For coronary artery disease and hyperlipidemia continue current medications   #Chronic pulmonary embolism/DVT S: Medication: Eliquis  5 mg twice daily due to history of recurrent DVT A/P: has done well on this- continue to monitor      # Diabetes S: Medication:, metformin  1000 mg twice daily, ozempic  retrial 02/2022 0.5 mg- has been  off about a month and sugars could be higher-Significant nausea issues in the past on Ozempic  but had been doing ok on 1 mg - UTIs with Jardiance  unfortunately Lab Results  Component Value Date   HGBA1C 8.1 (H) 05/26/2023   HGBA1C 7.3 (H) 01/06/2023   HGBA1C 10.7 (H) 07/29/2022   A/P: a1c above goal last time - and has fallen off Ozempic - restart and after a month if he opts to start at 0.5 mg or 2 months if starts with 0.25 mg then let me know so I can give the 1 mg dose again which I think we will need -interested in CONTINUOUS GLUCOSE MONITOR if covered- sent in   # GERD and has been told hiatal hernia S:Medication: Pantoprazole  40 mg. On multivitamin  Lab Results  Component Value Date   VITAMINB12 288 05/26/2023  A/P: check B12 on multivitamin and may need more with his fatige Gastroesophageal reflux disease - doing well continue current medications    #OSA-compliant with CPAP consistently   #Vitamin D  deficiency S: Medication: finished high dose- multivitamin with some Last vitamin D  Lab Results  Component Value Date   VD25OH 24.46 (L) 05/26/2023   A/P: hopefully stable- update vitamin D  today. Continue current meds for now    Recommended follow up: Return in about 14 weeks (around 07/01/2024) for followup or sooner if needed.Schedule b4 you leave. Future Appointments  Date Time Provider Department Center  05/31/2024  8:00 AM Katrinka Garnette KIDD, MD [facility]    Lab/Order associations:   ICD-10-CM   1. Immunization due  Z23 Flu vaccine trivalent PF, 6mos and older(Flulaval,Afluria,Fluarix,Fluzone)    2. Hyperlipidemia associated with type 2 diabetes mellitus (HCC)  E11.69 Lipid panel   E78.5 TSH    3. Type 2 diabetes mellitus with hyperglycemia, without long-term current use of insulin  (HCC)  E11.65 Comprehensive metabolic panel with GFR    Lipid panel    CBC with Differential/Platelet    Hemoglobin A1c    Microalbumin / creatinine urine ratio    4. Screening for prostate cancer  Z12.5 PSA    5. B12 deficiency  E53.8 Vitamin B12      Meds ordered this encounter  Medications   Semaglutide ,0.25 or 0.5MG /DOS, (OZEMPIC , 0.25 OR 0.5 MG/DOSE,) 2 MG/3ML SOPN    Sig: Inject 0.25 mg into the skin once a week for 28 days, THEN 0.5 mg once a week for 28 days.    Dispense:  6 mL    Refill:  0    Return precautions advised.  Garnette Katrinka, MD

## 2024-03-25 NOTE — Patient Instructions (Addendum)
 Please stop by lab before you go If you have mychart- we will send your results within 3 business days of us  receiving them.  If you do not have mychart- we will call you about results within 5 business days of us  receiving them.  *please also note that you will see labs on mychart as soon as they post. I will later go in and write notes on them- will say notes from Dr. Katrinka   a1c above goal last time and suspect may not be well controlled - and has fallen off Ozempic - restart and after a month if he opts to start at 0.5 mg or 2 months if starts with 0.25 mg then let me know so I can give the 1 mg dose again which I think we will need  Try the continuous glucose monitor libre plus and see if that is cost effective and helpful  We have placed a referral for you today to Dr. Harden with [facility] (who focuses on wound care)- please call their # if you do not hear within a week (may be listed below or you may see mychart message within a few days with #).    Recommended follow up: Return in about 14 weeks (around 07/01/2024) for followup or sooner if needed.Schedule b4 you leave.

## 2024-03-26 ENCOUNTER — Ambulatory Visit: Payer: Self-pay | Admitting: Family Medicine

## 2024-03-26 LAB — COMPREHENSIVE METABOLIC PANEL WITH GFR
ALT: 18 U/L (ref 0–53)
AST: 18 U/L (ref 0–37)
Albumin: 4 g/dL (ref 3.5–5.2)
Alkaline Phosphatase: 89 U/L (ref 39–117)
BUN: 16 mg/dL (ref 6–23)
CO2: 25 meq/L (ref 19–32)
Calcium: 9.2 mg/dL (ref 8.4–10.5)
Chloride: 105 meq/L (ref 96–112)
Creatinine, Ser: 1.02 mg/dL (ref 0.40–1.50)
GFR: 77.46 mL/min (ref >60.00)
Glucose, Bld: 124 mg/dL — ABNORMAL HIGH (ref 70–99)
Potassium: 3.9 meq/L (ref 3.5–5.1)
Sodium: 141 meq/L (ref 135–145)
Total Bilirubin: 0.6 mg/dL (ref 0.2–1.2)
Total Protein: 7.7 g/dL (ref 6.0–8.3)

## 2024-03-26 LAB — CBC WITH DIFFERENTIAL/PLATELET
Basophils Absolute: 0.1 K/uL (ref 0.0–0.1)
Basophils Relative: 1 % (ref 0.0–3.0)
Eosinophils Absolute: 0.7 K/uL (ref 0.0–0.7)
Eosinophils Relative: 7 % — ABNORMAL HIGH (ref 0.0–5.0)
HCT: 37.1 % — ABNORMAL LOW (ref 39.0–52.0)
Hemoglobin: 12 g/dL — ABNORMAL LOW (ref 13.0–17.0)
Lymphocytes Relative: 30.4 % (ref 12.0–46.0)
Lymphs Abs: 2.9 K/uL (ref 0.7–4.0)
MCHC: 32.4 g/dL (ref 30.0–36.0)
MCV: 83.4 fl (ref 78.0–100.0)
Monocytes Absolute: 0.7 K/uL (ref 0.1–1.0)
Monocytes Relative: 7.9 % (ref 3.0–12.0)
Neutro Abs: 5 K/uL (ref 1.4–7.7)
Neutrophils Relative %: 53.7 % (ref 43.0–77.0)
Platelets: 213 K/uL (ref 150.0–400.0)
RBC: 4.44 Mil/uL (ref 4.22–5.81)
RDW: 16.7 % — ABNORMAL HIGH (ref 11.5–15.5)
WBC: 9.4 K/uL (ref 4.0–10.5)

## 2024-03-26 LAB — MICROALBUMIN / CREATININE URINE RATIO
Creatinine,U: 131.2 mg/dL
Microalb Creat Ratio: 6.4 mg/g (ref 0.0–30.0)
Microalb, Ur: 0.8 mg/dL (ref 0.0–1.9)

## 2024-03-26 LAB — LIPID PANEL
Cholesterol: 113 mg/dL (ref 0–200)
HDL: 34.8 mg/dL — ABNORMAL LOW (ref >39.00)
LDL Cholesterol: 56 mg/dL (ref 0–99)
NonHDL: 77.78
Total CHOL/HDL Ratio: 3
Triglycerides: 108 mg/dL (ref 0.0–149.0)
VLDL: 21.6 mg/dL (ref 0.0–40.0)

## 2024-03-26 LAB — HEMOGLOBIN A1C: Hgb A1c MFr Bld: 9.6 % — ABNORMAL HIGH (ref 4.6–6.5)

## 2024-03-26 LAB — VITAMIN B12: Vitamin B-12: 253 pg/mL (ref 211–911)

## 2024-03-26 LAB — TSH: TSH: 1.19 u[IU]/mL (ref 0.35–5.50)

## 2024-03-26 LAB — PSA: PSA: 1.16 ng/mL (ref 0.10–4.00)

## 2024-03-29 ENCOUNTER — Other Ambulatory Visit: Payer: Self-pay | Admitting: Family Medicine

## 2024-03-29 ENCOUNTER — Encounter: Payer: Self-pay | Admitting: Radiology

## 2024-03-29 DIAGNOSIS — D649 Anemia, unspecified: Secondary | ICD-10-CM

## 2024-04-05 ENCOUNTER — Ambulatory Visit (INDEPENDENT_AMBULATORY_CARE_PROVIDER_SITE_OTHER): Payer: PRIVATE HEALTH INSURANCE | Admitting: Orthopedic Surgery

## 2024-04-05 DIAGNOSIS — I87331 Chronic venous hypertension (idiopathic) with ulcer and inflammation of right lower extremity: Secondary | ICD-10-CM | POA: Diagnosis not present

## 2024-04-07 ENCOUNTER — Encounter: Payer: Self-pay | Admitting: Orthopedic Surgery

## 2024-04-07 NOTE — Progress Notes (Signed)
 Office Visit Note   [name]           Date of Birth: Nov 13, 1960           MRN: [id number] Visit Date: 04/05/2024              Requested by: Katrinka Garnette KIDD, MD [address] [place],  [place] [address] PCP: Katrinka Garnette KIDD, MD  Chief Complaint  Patient presents with   Right Leg - Open Wound      HPI: Discussed the use of AI scribe software for clinical note transcription with the patient, who gave verbal consent to proceed.  History of Present Illness Evan Leonard is a 63 year old male with right leg venous insufficiency ulceration who presents for evaluation and management.  He has had a venous insufficiency ulcer on his right leg for over four weeks, producing clear serosanguineous drainage. The ulcer measures approximately 4 by 5 centimeters and is surrounded by multiple satellite lesions, with the most painful lesion located over the medial ankle. He has been managing the ulcer by washing it with soap and water  and applying an over-the-counter silver  dressing.  He is not currently on antibiotics. His most recent hemoglobin A1c is 9.6.     Assessment & Plan: Visit Diagnoses: No diagnosis found.  Plan: Assessment and Plan Assessment & Plan Right leg venous insufficiency ulceration Chronic ulceration with large 4x5 cm ulcer, flat granulation tissue, and painful satellite lesions. Serosanguineous drainage noted. Strong dorsalis pedis pulse. Suboptimal glycemic control with HbA1c at 9.6. - Applied multilayer compression wrap. - Follow up in one week. - Discussed potential enrollment in prospective study.      Follow-Up Instructions: No follow-ups on file.   Ortho Exam  Patient is alert, oriented, no adenopathy, well-dressed, normal affect, normal respiratory effort. Physical Exam EXTREMITIES: Strong palpable dorsalis pedis pulse. SKIN: Ulcer 4x5 cm with flat granulation tissue. Multiple satellite lesions, most painful over medial  ankle.      Imaging: No results found. No images are attached to the encounter.  Labs: Lab Results  Component Value Date   HGBA1C 9.6 (H) 03/25/2024   HGBA1C 8.1 (H) 05/26/2023   HGBA1C 7.3 (H) 01/06/2023   ESRSEDRATE 21 (H) 01/01/2021   ESRSEDRATE 28 (H) 03/21/2018   CRP 0.7 01/01/2021   CRP <0.8 03/21/2018   REPTSTATUS 03/01/2024 FINAL 02/28/2024   GRAMSTAIN  03/21/2018    NO WBC SEEN NO ORGANISMS SEEN Performed at [facility], [address] [place], [place] [address]    CULT >=100,000 COLONIES/mL SERRATIA MARCESCENS (A) 02/28/2024   LABORGA SERRATIA MARCESCENS (A) 02/28/2024     Lab Results  Component Value Date   ALBUMIN 4.0 03/25/2024   ALBUMIN 4.0 05/26/2023   ALBUMIN 3.9 01/06/2023   PREALBUMIN 15.8 (L) 03/21/2018    Lab Results  Component Value Date   MG 1.7 02/24/2021   MG 2.1 06/20/2015   MG 1.7 06/16/2015   Lab Results  Component Value Date   VD25OH 24.46 (L) 05/26/2023    Lab Results  Component Value Date   PREALBUMIN 15.8 (L) 03/21/2018      Latest Ref Rng & Units 03/25/2024    3:52 PM 09/04/2023    6:59 AM 05/26/2023    8:51 AM  CBC EXTENDED  WBC 4.0 - 10.5 K/uL 9.4  8.3  7.5   RBC 4.22 - 5.81 Mil/uL 4.44  4.94  4.53   Hemoglobin 13.0 - 17.0 g/dL 12.0  13.8  13.1   HCT 39.0 - 52.0 % 37.1  43.3  39.9   Platelets 150.0 - 400.0 K/uL 213.0  178  187.0   NEUT# 1.4 - 7.7 K/uL 5.0   4.5   Lymph# 0.7 - 4.0 K/uL 2.9   2.1      There is no height or weight on file to calculate BMI.  Orders:  No orders of the defined types were placed in this encounter.  No orders of the defined types were placed in this encounter.    Procedures: No procedures performed  Clinical Data: No additional findings.  ROS:  All other systems negative, except as noted in the HPI. Review of Systems  Objective: Vital Signs: There were no vitals taken for this visit.  Specialty Comments:  No specialty comments available.  PMFS  History: Patient Active Problem List   Diagnosis Date Noted   Acute swimmer's ear of left side 09/22/2023   Otalgia of both ears 09/22/2023   Hyperlipidemia associated with type 2 diabetes mellitus (HCC) 03/25/2022   Positive colorectal cancer screening using Cologuard test 08/09/2021   Acid indigestion 08/09/2021   Hematochezia 08/09/2021   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 acute cor pulmonale (HCC) 06/05/2021   Pressure injury of skin 02/25/2021   Chronic anticoagulation 02/19/2021   CAD (coronary artery disease) 01/09/2021   Eczema 12/27/2019   Erectile dysfunction 12/27/2019   History of small bowel obstruction 08/20/2018   History of diabetic ulcer of foot 03/21/2018   Non-cardiac chest pain 02/09/2018   Renal stone 05/12/2017   S/P repair of recurrent ventral hernia 04/26/2017   History of pulmonary embolism 07/16/2016   Chronic venous insufficiency 01/25/2015   Incarcerated ventral hernia 12/13/2013   Diabetes mellitus with hyperglycemia (HCC) 03/31/2013   Leukocytosis 03/23/2013   Hyponatremia 03/23/2013   Morbid obesity with body mass index of 40.0-44.9 in adult [facility]) 05/05/2012   Obstructive sleep apnea 05/05/2012   DVT of left distal popliteal vein 05/05/2012   Past Medical History:  Diagnosis Date   CAD (coronary artery disease)    Diabetes mellitus, type II (HCC) 02/2013   Diabetic ulcer of right foot due to type 2 diabetes mellitus (HCC) 03/21/2018   DVT (deep venous thrombosis) (HCC) 05/04/2012   LLE   Exertional dyspnea 05/04/2012   isolated episode (05/05/2012)   Hepatic steatosis    History of chickenpox    Hypertension 03/21/2018   Obesity, Class III, BMI 40-49.9 (morbid obesity) (HCC) 03/21/2018   OSA on CPAP    Peripheral vascular disease    varicose veins   Pulmonary embolism (HCC) 05/04/2012   bilaterally   Pyelonephritis 04/2017   Small bowel obstruction (HCC) 09/06/2015   Varicose veins     Family History  Problem Relation Age of  Onset   Diabetes Mother        died age 65- also bleeding ulcer   Hypertension Mother    Heart failure Father        in his 73s (patient was 74). day before surgery planned   Other Maternal Grandmother        states natural causes all grandparents   Colon cancer Neg Hx    Esophageal cancer Neg Hx    Inflammatory bowel disease Neg Hx    Liver disease Neg Hx    Pancreatic cancer Neg Hx    Rectal cancer Neg Hx    Stomach cancer Neg Hx     Past Surgical History:  Procedure Laterality Date  BOWEL RESECTION N/A 02/20/2021   Procedure: SMALL BOWEL RESECTION;  Surgeon: Stevie, Herlene Righter, MD;  Location: [facility] OR;  Service: General;  Laterality: N/A;   CORONARY STENT INTERVENTION N/A 01/02/2021   Procedure: CORONARY STENT INTERVENTION;  Surgeon: Dann Candyce RAMAN, MD;  Location: [facility] INVASIVE CV LAB;  Service: Cardiovascular;  Laterality: N/A;   CYSTOSCOPY W/ URETERAL STENT PLACEMENT Right 05/12/2017   Procedure: CYSTOSCOPY WITH RETROGRADE PYELOGRAM/URETERAL STENT PLACEMENT;  Surgeon: Watt Rush, MD;  Location: [facility] OR;  Service: Urology;  Laterality: Right;   CYSTOSCOPY/URETEROSCOPY/HOLMIUM LASER/STENT PLACEMENT Right 07/29/2017   Procedure: CYSTOSCOPY RIGHT URETEROSCOPY HOLMIUM LASER STENT EXCHANGE;  Surgeon: Watt Rush, MD;  Location: [facility] ORS;  Service: Urology;  Laterality: Right;   HERNIA REPAIR     2015   LAPAROTOMY N/A 12/13/2013   Procedure: EXPLORATORY LAPAROTOMY Repair ventral hernia, without mesh, partial omentectomy;  Surgeon: Lynwood MALVA Pina, MD;  Location: [facility] OR;  Service: General;  Laterality: N/A;   LEFT HEART CATH AND CORONARY ANGIOGRAPHY N/A 01/02/2021   Procedure: LEFT HEART CATH AND CORONARY ANGIOGRAPHY;  Surgeon: Dann Candyce RAMAN, MD;  Location: [facility] INVASIVE CV LAB;  Service: Cardiovascular;  Laterality: N/A;   LYSIS OF ADHESION  02/20/2021   Procedure: LYSIS OF ADHESION;  Surgeon: Stevie, Herlene Righter, MD;  Location: [facility] OR;  Service: General;;   VENTRAL HERNIA REPAIR N/A  04/26/2017   Procedure: INCARCERATED HERNIA REPAIR VENTRAL ADULT;  Surgeon: Sebastian Moles, MD;  Location: [facility] OR;  Service: General;  Laterality: N/A;   VENTRAL HERNIA REPAIR N/A 02/20/2021   Procedure: EXPLORATORY LAPAROTOMY; RECURRENT HERNIA REPAIR WITH OVERLAY MESH;  Surgeon: Kinsinger, Herlene Righter, MD;  Location: [facility] OR;  Service: General;  Laterality: N/A;   Social History   Occupational History   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

## 2024-04-12 ENCOUNTER — Encounter: Payer: Self-pay | Admitting: Orthopedic Surgery

## 2024-04-12 ENCOUNTER — Ambulatory Visit (INDEPENDENT_AMBULATORY_CARE_PROVIDER_SITE_OTHER): Payer: PRIVATE HEALTH INSURANCE | Admitting: Orthopedic Surgery

## 2024-04-12 DIAGNOSIS — I87332 Chronic venous hypertension (idiopathic) with ulcer and inflammation of left lower extremity: Secondary | ICD-10-CM

## 2024-04-12 DIAGNOSIS — I87331 Chronic venous hypertension (idiopathic) with ulcer and inflammation of right lower extremity: Secondary | ICD-10-CM

## 2024-04-12 NOTE — Progress Notes (Addendum)
 Office Visit Note   [name]           Date of Birth: 1961-02-14           MRN: [id number] Visit Date: 04/12/2024              Requested by: Katrinka Garnette KIDD, MD [address] [place],  [place] [address] PCP: Katrinka Garnette KIDD, MD  Chief Complaint  Patient presents with   Right Leg - Wound Check      HPI: Discussed the use of AI scribe software for clinical note transcription with the patient, who gave verbal consent to proceed.  History of Present Illness Evan Leonard is a 63 year old male who presents with a non-healing wound on his calf and an ankle ulcer.  He has a large wound on his calf that has been wrapped for a week. The wound measures three by four centimeters.  He also has an ulcer over the ankle bone region, less than one centimeter in diameter. This area is described as the sorest part, with increased tenderness compared to the calf wound. He attributes the tenderness to the location's reduced microcirculation.  He is concerned about a smell noticed this morning, initially worrying it might indicate an issue.  Patient is not a candidate for the Kerecis study due to the inclusion criteria requiring BMI to less than 40  Assessment & Plan: Visit Diagnoses:  1. Chronic venous hypertension (idiopathic) with ulcer and inflammation of right lower extremity (HCC)     Plan: Assessment and Plan Assessment & Plan Chronic non-pressure ulcer of left calf Ulcer 3x4 cm with healthy granulation and good circulation, positive healing prognosis. 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 - Weekly follow-up. - Investigate tissue graft benefits.  Chronic non-pressure ulcer of left ankle Ulcer <1 cm with healthy granulation, symptomatic due to location, expected to heal with standard care. - Continue standard wound care with wraps. - Weekly follow-up.      Follow-Up Instructions: Return in about 1 week (around 04/19/2024).   Ortho  Exam  Patient is alert, oriented, no adenopathy, well-dressed, normal affect, normal respiratory effort. Physical Exam EXTREMITIES: Calf wound flat with healthy granulation tissue, measuring 3x4 cm. Ankle ulcer less than 1 cm in diameter with healthy granulation tissue. Good pulse in lower extremity.      Imaging: No results found.    Labs: Lab Results  Component Value Date   HGBA1C 9.6 (H) 03/25/2024   HGBA1C 8.1 (H) 05/26/2023   HGBA1C 7.3 (H) 01/06/2023   ESRSEDRATE 21 (H) 01/01/2021   ESRSEDRATE 28 (H) 03/21/2018   CRP 0.7 01/01/2021   CRP <0.8 03/21/2018   REPTSTATUS 03/01/2024 FINAL 02/28/2024   GRAMSTAIN  03/21/2018    NO WBC SEEN NO ORGANISMS SEEN Performed at [facility], [address], [name], [place] [address]    CULT >=100,000 COLONIES/mL SERRATIA MARCESCENS (A) 02/28/2024   LABORGA SERRATIA MARCESCENS (A) 02/28/2024     Lab Results  Component Value Date   ALBUMIN 4.0 03/25/2024   ALBUMIN 4.0 05/26/2023   ALBUMIN 3.9 01/06/2023   PREALBUMIN 15.8 (L) 03/21/2018    Lab Results  Component Value Date   MG 1.7 02/24/2021   MG 2.1 06/20/2015   MG 1.7 06/16/2015   Lab Results  Component Value Date   VD25OH 24.46 (L) 05/26/2023    Lab Results  Component Value Date   PREALBUMIN 15.8 (L) 03/21/2018      Latest  Ref Rng & Units 03/25/2024    3:52 PM 09/04/2023    6:59 AM 05/26/2023    8:51 AM  CBC EXTENDED  WBC 4.0 - 10.5 K/uL 9.4  8.3  7.5   RBC 4.22 - 5.81 Mil/uL 4.44  4.94  4.53   Hemoglobin 13.0 - 17.0 g/dL 87.9  86.1  86.8   HCT 39.0 - 52.0 % 37.1  43.3  39.9   Platelets 150.0 - 400.0 K/uL 213.0  178  187.0   NEUT# 1.4 - 7.7 K/uL 5.0   4.5   Lymph# 0.7 - 4.0 K/uL 2.9   2.1      There is no height or weight on file to calculate BMI.  Orders:  No orders of the defined types were placed in this encounter.  No orders of the defined types were placed in this encounter.    Procedures: No procedures performed  Clinical  Data: No additional findings.  ROS:  All other systems negative, except as noted in the HPI. Review of Systems  Objective: Vital Signs: There were no vitals taken for this visit.  Specialty Comments:  No specialty comments available.  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 acute cor pulmonale (HCC) 06/05/2021   Pressure injury of skin 02/25/2021   Chronic anticoagulation 02/19/2021   CAD (coronary artery disease) 01/09/2021   Eczema 12/27/2019   Erectile dysfunction 12/27/2019   History of small bowel obstruction 08/20/2018   History of diabetic ulcer of foot 03/21/2018   Non-cardiac chest pain 02/09/2018   Renal stone 05/12/2017   S/P repair of recurrent ventral hernia 04/26/2017   History of pulmonary embolism 07/16/2016   Chronic venous insufficiency 01/25/2015   Incarcerated ventral hernia 12/13/2013   Diabetes mellitus with hyperglycemia (HCC) 03/31/2013   Leukocytosis 03/23/2013   Hyponatremia 03/23/2013   Morbid obesity with body mass index of 40.0-44.9 in adult [facility]) 05/05/2012   Obstructive sleep apnea 05/05/2012   DVT of left distal popliteal vein 05/05/2012   Past Medical History:  Diagnosis Date   CAD (coronary artery disease)    Diabetes mellitus, type II (HCC) 02/2013   Diabetic ulcer of right foot due to type 2 diabetes mellitus (HCC) 03/21/2018   DVT (deep venous thrombosis) (HCC) 05/04/2012   LLE   Exertional dyspnea 05/04/2012   isolated episode (05/05/2012)   Hepatic steatosis    History of chickenpox    Hypertension 03/21/2018   Obesity, Class III, BMI 40-49.9 (morbid obesity) (HCC) 03/21/2018    OSA on CPAP    Peripheral vascular disease    varicose veins   Pulmonary embolism (HCC) 05/04/2012   bilaterally   Pyelonephritis 04/2017   Small bowel obstruction (HCC) 09/06/2015   Varicose veins     Family History  Problem Relation Age of Onset   Diabetes Mother        died age 66- also bleeding ulcer   Hypertension Mother    Heart failure Father        in his 33s (patient was 53). day before surgery planned   Other Maternal Grandmother        states natural causes all grandparents   Colon cancer  Neg Hx    Esophageal cancer Neg Hx    Inflammatory bowel disease Neg Hx    Liver disease Neg Hx    Pancreatic cancer Neg Hx    Rectal cancer Neg Hx    Stomach cancer Neg Hx     Past Surgical History:  Procedure Laterality Date   BOWEL RESECTION N/A 02/20/2021   Procedure: SMALL BOWEL RESECTION;  Surgeon: Stevie, Herlene Righter, MD;  Location: [facility] OR;  Service: General;  Laterality: N/A;   CORONARY STENT INTERVENTION N/A 01/02/2021   Procedure: CORONARY STENT INTERVENTION;  Surgeon: Dann Candyce RAMAN, MD;  Location: [facility] INVASIVE CV LAB;  Service: Cardiovascular;  Laterality: N/A;   CYSTOSCOPY W/ URETERAL STENT PLACEMENT Right 05/12/2017   Procedure: CYSTOSCOPY WITH RETROGRADE PYELOGRAM/URETERAL STENT PLACEMENT;  Surgeon: Watt Rush, MD;  Location: [facility] OR;  Service: Urology;  Laterality: Right;   CYSTOSCOPY/URETEROSCOPY/HOLMIUM LASER/STENT PLACEMENT Right 07/29/2017   Procedure: CYSTOSCOPY RIGHT URETEROSCOPY HOLMIUM LASER STENT EXCHANGE;  Surgeon: Watt Rush, MD;  Location: [facility];  Service: Urology;  Laterality: Right;   HERNIA REPAIR     2015   LAPAROTOMY N/A 12/13/2013   Procedure: EXPLORATORY LAPAROTOMY Repair ventral hernia, without mesh, partial omentectomy;  Surgeon: Lynwood MALVA Pina, MD;  Location: [facility] OR;  Service: General;  Laterality: N/A;   LEFT HEART CATH AND CORONARY ANGIOGRAPHY N/A 01/02/2021   Procedure: LEFT HEART CATH AND CORONARY ANGIOGRAPHY;  Surgeon: Dann Candyce RAMAN, MD;  Location: [facility] INVASIVE CV LAB;  Service: Cardiovascular;  Laterality: N/A;   LYSIS OF ADHESION  02/20/2021   Procedure: LYSIS OF ADHESION;  Surgeon: Stevie, Herlene Righter, MD;  Location: [facility] OR;  Service: General;;   VENTRAL HERNIA REPAIR N/A 04/26/2017   Procedure: INCARCERATED HERNIA REPAIR VENTRAL ADULT;  Surgeon: Sebastian Moles, MD;  Location: [facility] OR;  Service: General;  Laterality: N/A;   VENTRAL HERNIA REPAIR N/A 02/20/2021   Procedure: EXPLORATORY LAPAROTOMY; RECURRENT HERNIA REPAIR WITH OVERLAY MESH;  Surgeon: Kinsinger, Herlene Righter, MD;  Location: [facility] OR;  Service: General;  Laterality: N/A;   Social History   Occupational History   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

## 2024-04-14 ENCOUNTER — Other Ambulatory Visit: Payer: Self-pay | Admitting: *Deleted

## 2024-04-14 ENCOUNTER — Other Ambulatory Visit (INDEPENDENT_AMBULATORY_CARE_PROVIDER_SITE_OTHER): Payer: PRIVATE HEALTH INSURANCE

## 2024-04-14 ENCOUNTER — Telehealth: Payer: Self-pay | Admitting: *Deleted

## 2024-04-14 DIAGNOSIS — D649 Anemia, unspecified: Secondary | ICD-10-CM | POA: Diagnosis not present

## 2024-04-14 DIAGNOSIS — E611 Iron deficiency: Secondary | ICD-10-CM

## 2024-04-14 NOTE — Telephone Encounter (Signed)
 FOBT future lab order  Patient pick up kit sample today  Explain how to collect sample  Advise to bring it to lab once sample collected  Patient verbalized understanding

## 2024-04-15 ENCOUNTER — Ambulatory Visit: Payer: PRIVATE HEALTH INSURANCE | Admitting: Physician Assistant

## 2024-04-15 ENCOUNTER — Encounter: Payer: Self-pay | Admitting: Physician Assistant

## 2024-04-15 ENCOUNTER — Ambulatory Visit: Payer: Self-pay | Admitting: Family Medicine

## 2024-04-15 ENCOUNTER — Telehealth: Payer: Self-pay

## 2024-04-15 DIAGNOSIS — I87331 Chronic venous hypertension (idiopathic) with ulcer and inflammation of right lower extremity: Secondary | ICD-10-CM

## 2024-04-15 LAB — CBC WITH DIFFERENTIAL/PLATELET
Basophils Absolute: 0.1 K/uL (ref 0.0–0.1)
Basophils Relative: 1.2 % (ref 0.0–3.0)
Eosinophils Absolute: 0.4 K/uL (ref 0.0–0.7)
Eosinophils Relative: 4.1 % (ref 0.0–5.0)
HCT: 36.4 % — ABNORMAL LOW (ref 39.0–52.0)
Hemoglobin: 11.9 g/dL — ABNORMAL LOW (ref 13.0–17.0)
Lymphocytes Relative: 28 % (ref 12.0–46.0)
Lymphs Abs: 2.6 K/uL (ref 0.7–4.0)
MCHC: 32.6 g/dL (ref 30.0–36.0)
MCV: 83.5 fl (ref 78.0–100.0)
Monocytes Absolute: 0.6 K/uL (ref 0.1–1.0)
Monocytes Relative: 6.9 % (ref 3.0–12.0)
Neutro Abs: 5.5 K/uL (ref 1.4–7.7)
Neutrophils Relative %: 59.8 % (ref 43.0–77.0)
Platelets: 234 K/uL (ref 150.0–400.0)
RBC: 4.36 Mil/uL (ref 4.22–5.81)
RDW: 16.7 % — ABNORMAL HIGH (ref 11.5–15.5)
WBC: 9.2 K/uL (ref 4.0–10.5)

## 2024-04-15 NOTE — Progress Notes (Signed)
 Office Visit Note   [name]           Date of Birth: April 09, 1961           MRN: [id number] Visit Date: 04/15/2024              Requested by: Katrinka Garnette KIDD, MD [address] [place],  [place] [address] PCP: Katrinka Garnette KIDD, MD  Chief Complaint  Patient presents with   Right Leg - Wound Check      HPI: Evan Leonard is a 63 year old male who presents with a non-healing wound on his calf and an ankle ulcer.    Patient is not a candidate for the Kerecis study due to the inclusion criteria requiring BMI to less than 40.    He was seen by Dr. Harden on 04/12/24 and was in the shower earlier today and got his dressing wet.  He is here for a dressing change.    Assessment & Plan: Visit Diagnoses:  1. Chronic venous hypertension (idiopathic) with ulcer and inflammation of right lower extremity (HCC)     Plan: Dynaflex wrap to left LE.  Elevate when at rest above your heart.  F/U in 1 week for dressing change and exam.    Follow-Up Instructions: Return in about 4 days (around 04/19/2024).   Ortho Exam  Patient is alert, oriented, no adenopathy, well-dressed, normal affect, normal respiratory effort. Ulcer 3 cm x 4 cm anterior shin and < 1 cm medial ankle.   Good skin lines due to decreased edema with wrap.  Palpable DP pulse.        Imaging: No results found. No images are attached to the encounter.  Labs: Lab Results  Component Value Date   HGBA1C 9.6 (H) 03/25/2024   HGBA1C 8.1 (H) 05/26/2023   HGBA1C 7.3 (H) 01/06/2023   ESRSEDRATE 21 (H) 01/01/2021   ESRSEDRATE 28 (H) 03/21/2018   CRP 0.7 01/01/2021   CRP <0.8 03/21/2018   REPTSTATUS 03/01/2024 FINAL 02/28/2024   GRAMSTAIN  03/21/2018    NO WBC SEEN NO ORGANISMS SEEN Performed at [facility], [address] [place], [place] [address]    CULT >=100,000 COLONIES/mL SERRATIA MARCESCENS (A) 02/28/2024   LABORGA SERRATIA MARCESCENS (A) 02/28/2024     Lab Results   Component Value Date   ALBUMIN 4.0 03/25/2024   ALBUMIN 4.0 05/26/2023   ALBUMIN 3.9 01/06/2023   PREALBUMIN 15.8 (L) 03/21/2018    Lab Results  Component Value Date   MG 1.7 02/24/2021   MG 2.1 06/20/2015   MG 1.7 06/16/2015   Lab Results  Component Value Date   VD25OH 24.46 (L) 05/26/2023    Lab Results  Component Value Date   PREALBUMIN 15.8 (L) 03/21/2018      Latest Ref Rng & Units 04/14/2024    3:17 PM 03/25/2024    3:52 PM 09/04/2023    6:59 AM  CBC EXTENDED  WBC 4.0 - 10.5 K/uL 9.2  9.4  8.3   RBC 4.22 - 5.81 Mil/uL 4.36  4.44  4.94   Hemoglobin 13.0 - 17.0 g/dL 88.0  87.9  86.1   HCT 39.0 - 52.0 % 36.4  37.1  43.3   Platelets 150.0 - 400.0 K/uL 234.0  213.0  178   NEUT# 1.4 - 7.7 K/uL 5.5  5.0    Lymph# 0.7 - 4.0 K/uL 2.6  2.9       There is no height or weight  on file to calculate BMI.  Orders:  No orders of the defined types were placed in this encounter.  No orders of the defined types were placed in this encounter.    Procedures: No procedures performed  Clinical Data: No additional findings.  ROS:  All other systems negative, except as noted in the HPI. Review of Systems  Objective: Vital Signs: There were no vitals taken for this visit.  Specialty Comments:  No specialty comments available.  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 acute cor pulmonale (HCC) 06/05/2021   Pressure injury of skin 02/25/2021   Chronic anticoagulation 02/19/2021   CAD (coronary artery disease) 01/09/2021   Eczema 12/27/2019   Erectile dysfunction 12/27/2019   History of small bowel obstruction  08/20/2018   History of diabetic ulcer of foot 03/21/2018   Non-cardiac chest pain 02/09/2018   Renal stone 05/12/2017   S/P repair of recurrent ventral hernia 04/26/2017   History of pulmonary embolism 07/16/2016   Chronic venous insufficiency 01/25/2015   Incarcerated ventral hernia 12/13/2013   Diabetes mellitus with hyperglycemia (HCC) 03/31/2013   Leukocytosis 03/23/2013   Hyponatremia 03/23/2013   Morbid obesity with body mass index of 40.0-44.9 in adult [facility]) 05/05/2012   Obstructive sleep apnea 05/05/2012   DVT of left distal popliteal vein 05/05/2012   Past Medical History:  Diagnosis Date   CAD (coronary artery disease)    Diabetes mellitus, type II (HCC) 02/2013   Diabetic ulcer of right foot due to type 2 diabetes mellitus (HCC) 03/21/2018   DVT (deep venous thrombosis) (HCC) 05/04/2012   LLE   Exertional dyspnea 05/04/2012   isolated episode (05/05/2012)   Hepatic steatosis    History of chickenpox    Hypertension 03/21/2018   Obesity, Class III, BMI 40-49.9 (morbid obesity) (HCC) 03/21/2018   OSA on CPAP    Peripheral vascular disease    varicose veins   Pulmonary embolism (HCC) 05/04/2012   bilaterally   Pyelonephritis 04/2017   Small bowel obstruction (HCC) 09/06/2015   Varicose veins     Family History  Problem Relation Age of Onset   Diabetes Mother        died age 16- also bleeding ulcer   Hypertension Mother    Heart failure Father        in his 49s (patient was 73). day before surgery planned   Other Maternal Grandmother        states natural causes all grandparents   Colon cancer Neg Hx    Esophageal cancer Neg Hx    Inflammatory bowel disease Neg Hx    Liver disease Neg Hx    Pancreatic cancer Neg Hx    Rectal cancer Neg Hx    Stomach cancer Neg Hx     Past Surgical History:  Procedure Laterality Date   BOWEL RESECTION N/A 02/20/2021   Procedure: SMALL BOWEL RESECTION;  Surgeon: Stevie, Herlene Righter, MD;  Location: [facility] OR;  Service:  General;  Laterality: N/A;   CORONARY STENT INTERVENTION N/A 01/02/2021   Procedure: CORONARY STENT INTERVENTION;  Surgeon: Dann Candyce RAMAN, MD;  Location: [facility] LAB;  Service: Cardiovascular;  Laterality: N/A;  CYSTOSCOPY W/ URETERAL STENT PLACEMENT Right 05/12/2017   Procedure: CYSTOSCOPY WITH RETROGRADE PYELOGRAM/URETERAL STENT PLACEMENT;  Surgeon: Watt Rush, MD;  Location: [facility] OR;  Service: Urology;  Laterality: Right;   CYSTOSCOPY/URETEROSCOPY/HOLMIUM LASER/STENT PLACEMENT Right 07/29/2017   Procedure: CYSTOSCOPY RIGHT URETEROSCOPY HOLMIUM LASER STENT EXCHANGE;  Surgeon: Watt Rush, MD;  Location: [facility];  Service: Urology;  Laterality: Right;   HERNIA REPAIR     2015   LAPAROTOMY N/A 12/13/2013   Procedure: EXPLORATORY LAPAROTOMY Repair ventral hernia, without mesh, partial omentectomy;  Surgeon: Lynwood MALVA Pina, MD;  Location: [facility] OR;  Service: General;  Laterality: N/A;   LEFT HEART CATH AND CORONARY ANGIOGRAPHY N/A 01/02/2021   Procedure: LEFT HEART CATH AND CORONARY ANGIOGRAPHY;  Surgeon: Dann Candyce RAMAN, MD;  Location: [facility] INVASIVE CV LAB;  Service: Cardiovascular;  Laterality: N/A;   LYSIS OF ADHESION  02/20/2021   Procedure: LYSIS OF ADHESION;  Surgeon: Stevie, Herlene Righter, MD;  Location: [facility] OR;  Service: General;;   VENTRAL HERNIA REPAIR N/A 04/26/2017   Procedure: INCARCERATED HERNIA REPAIR VENTRAL ADULT;  Surgeon: Sebastian Moles, MD;  Location: [facility] OR;  Service: General;  Laterality: N/A;   VENTRAL HERNIA REPAIR N/A 02/20/2021   Procedure: EXPLORATORY LAPAROTOMY; RECURRENT HERNIA REPAIR WITH OVERLAY MESH;  Surgeon: Kinsinger, Herlene Righter, MD;  Location: [facility] OR;  Service: General;  Laterality: N/A;   Social History   Occupational History   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

## 2024-04-15 NOTE — Telephone Encounter (Signed)
 Patient called stating that his bandage is saturated and would like to know if he needs to come into the office or what can be done.  CB# [phone].  Please advise.  Thank you.

## 2024-04-15 NOTE — Telephone Encounter (Signed)
 I called and sw pt advised to remove the profore dressing and he will come in today at 2:15 to have it reapplied.

## 2024-04-18 ENCOUNTER — Other Ambulatory Visit: Payer: Self-pay | Admitting: Family Medicine

## 2024-04-19 ENCOUNTER — Encounter: Payer: Self-pay | Admitting: Physician Assistant

## 2024-04-19 ENCOUNTER — Other Ambulatory Visit (INDEPENDENT_AMBULATORY_CARE_PROVIDER_SITE_OTHER): Payer: PRIVATE HEALTH INSURANCE

## 2024-04-19 ENCOUNTER — Ambulatory Visit (INDEPENDENT_AMBULATORY_CARE_PROVIDER_SITE_OTHER): Payer: PRIVATE HEALTH INSURANCE | Admitting: Physician Assistant

## 2024-04-19 DIAGNOSIS — I87331 Chronic venous hypertension (idiopathic) with ulcer and inflammation of right lower extremity: Secondary | ICD-10-CM

## 2024-04-19 DIAGNOSIS — E611 Iron deficiency: Secondary | ICD-10-CM

## 2024-04-19 NOTE — Progress Notes (Signed)
 Office Visit Note   [name]           Date of Birth: 03-28-1961           MRN: [id number] Visit Date: 04/19/2024              Requested by: Katrinka Garnette KIDD, MD [address] [place],  [place] [address] PCP: Katrinka Garnette KIDD, MD  Chief Complaint  Patient presents with   Right Leg - Follow-up      HPI: Evan Leonard is a 63 year old male who presents with a non-healing wound on his calf and an ankle ulcer.    Patient is not a candidate for the Kerecis study due to the inclusion criteria requiring BMI to less than 40.   He is doing well over all with the compression wrap.  He works full time at [employer].    Assessment & Plan: Visit Diagnoses:  1. Chronic venous hypertension (idiopathic) with ulcer and inflammation of right lower extremity (HCC)     Plan: Dynaflex wrap to left LE. Elevate when at rest above your heart. F/U in 1 week for dressing change and exam.   Follow-Up Instructions: Return in about 1 week (around 04/26/2024).   Ortho Exam  Patient is alert, oriented, no adenopathy, well-dressed, normal affect, normal respiratory effort. Largest anterior leg wound measures 6 cm x 3.5.  100% granulation tissue with flat base.  Smaller anterior 1 cm x 1 cm 100 % granulations base, and ankle wound is very superficial 5 mm x 1 cm.  Decreased right LE edema with good skin lines and palpable DP pulse is intact.             Imaging: No results found. No images are attached to the encounter.  Labs: Lab Results  Component Value Date   HGBA1C 9.6 (H) 03/25/2024   HGBA1C 8.1 (H) 05/26/2023   HGBA1C 7.3 (H) 01/06/2023   ESRSEDRATE 21 (H) 01/01/2021   ESRSEDRATE 28 (H) 03/21/2018   CRP 0.7 01/01/2021   CRP <0.8 03/21/2018   REPTSTATUS 03/01/2024 FINAL 02/28/2024   GRAMSTAIN  03/21/2018    NO WBC SEEN NO ORGANISMS SEEN Performed at [facility], [address], [place], [place] [address]    CULT >=100,000 COLONIES/mL  SERRATIA MARCESCENS (A) 02/28/2024   LABORGA SERRATIA MARCESCENS (A) 02/28/2024     Lab Results  Component Value Date   ALBUMIN 4.0 03/25/2024   ALBUMIN 4.0 05/26/2023   ALBUMIN 3.9 01/06/2023   PREALBUMIN 15.8 (L) 03/21/2018    Lab Results  Component Value Date   MG 1.7 02/24/2021   MG 2.1 06/20/2015   MG 1.7 06/16/2015   Lab Results  Component Value Date   VD25OH 24.46 (L) 05/26/2023    Lab Results  Component Value Date   PREALBUMIN 15.8 (L) 03/21/2018      Latest Ref Rng & Units 04/14/2024    3:17 PM 03/25/2024    3:52 PM 09/04/2023    6:59 AM  CBC EXTENDED  WBC 4.0 - 10.5 K/uL 9.2  9.4  8.3   RBC 4.22 - 5.81 Mil/uL 4.36  4.44  4.94   Hemoglobin 13.0 - 17.0 g/dL 88.0  87.9  86.1   HCT 39.0 - 52.0 % 36.4  37.1  43.3   Platelets 150.0 - 400.0 K/uL 234.0  213.0  178   NEUT# 1.4 - 7.7 K/uL 5.5  5.0    Lymph# 0.7 - 4.0  K/uL 2.6  2.9       There is no height or weight on file to calculate BMI.  Orders:  No orders of the defined types were placed in this encounter.  No orders of the defined types were placed in this encounter.    Procedures: No procedures performed  Clinical Data: No additional findings.  ROS:  All other systems negative, except as noted in the HPI. Review of Systems  Objective: Vital Signs: There were no vitals taken for this visit.  Specialty Comments:  No specialty comments available.  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 acute cor pulmonale (HCC) 06/05/2021   Pressure injury of skin 02/25/2021   Chronic anticoagulation 02/19/2021   CAD (coronary artery disease) 01/09/2021    Eczema 12/27/2019   Erectile dysfunction 12/27/2019   History of small bowel obstruction 08/20/2018   History of diabetic ulcer of foot 03/21/2018   Non-cardiac chest pain 02/09/2018   Renal stone 05/12/2017   S/P repair of recurrent ventral hernia 04/26/2017   History of pulmonary embolism 07/16/2016   Chronic venous insufficiency 01/25/2015   Incarcerated ventral hernia 12/13/2013   Diabetes mellitus with hyperglycemia (HCC) 03/31/2013   Leukocytosis 03/23/2013   Hyponatremia 03/23/2013   Morbid obesity with body mass index of 40.0-44.9 in adult [facility]) 05/05/2012   Obstructive sleep apnea 05/05/2012   DVT of left distal popliteal vein 05/05/2012   Past Medical History:  Diagnosis Date   CAD (coronary artery disease)    Diabetes mellitus, type II (HCC) 02/2013   Diabetic ulcer of right foot due to type 2 diabetes mellitus (HCC) 03/21/2018   DVT (deep venous thrombosis) (HCC) 05/04/2012   LLE   Exertional dyspnea 05/04/2012   isolated episode (05/05/2012)   Hepatic steatosis    History of chickenpox    Hypertension 03/21/2018   Obesity, Class III, BMI 40-49.9 (morbid obesity) (HCC) 03/21/2018   OSA on CPAP    Peripheral vascular disease    varicose veins   Pulmonary embolism (HCC) 05/04/2012   bilaterally   Pyelonephritis 04/2017   Small bowel obstruction (HCC) 09/06/2015   Varicose veins     Family History  Problem Relation Age of Onset   Diabetes Mother        died age 45- also bleeding ulcer   Hypertension Mother    Heart failure Father        in his 64s (patient was 36). day before surgery planned   Other Maternal Grandmother        states natural causes all grandparents   Colon cancer Neg Hx    Esophageal cancer Neg Hx    Inflammatory bowel disease Neg Hx    Liver disease Neg Hx    Pancreatic cancer Neg Hx    Rectal cancer Neg Hx    Stomach cancer Neg Hx     Past Surgical History:  Procedure Laterality Date   BOWEL RESECTION N/A 02/20/2021   Procedure:  SMALL BOWEL RESECTION;  Surgeon: Stevie, Herlene Righter, MD;  Location: [facility] OR;  Service: General;  Laterality: N/A;   CORONARY STENT INTERVENTION N/A 01/02/2021   Procedure: CORONARY STENT INTERVENTION;  Surgeon: Dann Beverage  S, MD;  Location: [facility] INVASIVE CV LAB;  Service: Cardiovascular;  Laterality: N/A;   CYSTOSCOPY W/ URETERAL STENT PLACEMENT Right 05/12/2017   Procedure: CYSTOSCOPY WITH RETROGRADE PYELOGRAM/URETERAL STENT PLACEMENT;  Surgeon: Watt Rush, MD;  Location: [facility] OR;  Service: Urology;  Laterality: Right;   CYSTOSCOPY/URETEROSCOPY/HOLMIUM LASER/STENT PLACEMENT Right 07/29/2017   Procedure: CYSTOSCOPY RIGHT URETEROSCOPY HOLMIUM LASER STENT EXCHANGE;  Surgeon: Watt Rush, MD;  Location: [facility] ORS;  Service: Urology;  Laterality: Right;   HERNIA REPAIR     2015   LAPAROTOMY N/A 12/13/2013   Procedure: EXPLORATORY LAPAROTOMY Repair ventral hernia, without mesh, partial omentectomy;  Surgeon: Lynwood MALVA Pina, MD;  Location: [facility] OR;  Service: General;  Laterality: N/A;   LEFT HEART CATH AND CORONARY ANGIOGRAPHY N/A 01/02/2021   Procedure: LEFT HEART CATH AND CORONARY ANGIOGRAPHY;  Surgeon: Dann Candyce RAMAN, MD;  Location: [facility] INVASIVE CV LAB;  Service: Cardiovascular;  Laterality: N/A;   LYSIS OF ADHESION  02/20/2021   Procedure: LYSIS OF ADHESION;  Surgeon: Stevie, Herlene Righter, MD;  Location: [facility] OR;  Service: General;;   VENTRAL HERNIA REPAIR N/A 04/26/2017   Procedure: INCARCERATED HERNIA REPAIR VENTRAL ADULT;  Surgeon: Sebastian Moles, MD;  Location: [facility] OR;  Service: General;  Laterality: N/A;   VENTRAL HERNIA REPAIR N/A 02/20/2021   Procedure: EXPLORATORY LAPAROTOMY; RECURRENT HERNIA REPAIR WITH OVERLAY MESH;  Surgeon: Kinsinger, Herlene Righter, MD;  Location: [facility] OR;  Service: General;  Laterality: N/A;   Social History   Occupational History   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

## 2024-04-21 LAB — FECAL OCCULT BLOOD, IMMUNOCHEMICAL: Fecal Occult Bld: POSITIVE — AB

## 2024-04-26 ENCOUNTER — Ambulatory Visit: Payer: Self-pay | Admitting: Family Medicine

## 2024-04-26 ENCOUNTER — Encounter: Payer: Self-pay | Admitting: Physician Assistant

## 2024-04-26 ENCOUNTER — Ambulatory Visit: Payer: PRIVATE HEALTH INSURANCE | Admitting: Physician Assistant

## 2024-04-26 DIAGNOSIS — K921 Melena: Secondary | ICD-10-CM

## 2024-04-26 DIAGNOSIS — I87331 Chronic venous hypertension (idiopathic) with ulcer and inflammation of right lower extremity: Secondary | ICD-10-CM | POA: Diagnosis not present

## 2024-04-26 DIAGNOSIS — D509 Iron deficiency anemia, unspecified: Secondary | ICD-10-CM

## 2024-04-26 NOTE — Progress Notes (Signed)
 Office Visit Note   [name]           Date of Birth: [birth year]           MRN: [id number] Visit Date: 04/26/2024              Requested by: Katrinka Garnette KIDD, MD [id number] [place],  [place] [address] PCP: Katrinka Garnette KIDD, MD  Chief Complaint  Patient presents with   Right Leg - Follow-up      HPI: Arlan Birks is a 63 year old male who presents with a non-healing wound on his calf and an ankle ulcer.    Patient is not a candidate for the Kerecis study due to the inclusion criteria requiring BMI to less than 40.              He is doing well over all with the compression wrap.  He works full time at [employer].    Assessment & Plan: Visit Diagnoses:  1. Chronic venous hypertension (idiopathic) with ulcer and inflammation of right lower extremity (HCC)     Plan: He has developed a new ulcer with the dynaflex wrap.  I recommended he do wet to dry daily Vashe dressing changes.  He will clean the wound bed with Vashe then place wet to dry Vashe guaze over the wounds and secure with ace wrap for mild compression.  Elevation when at rest.  Once the wounds have healed we will have him wear compression daily.    Follow-Up Instructions: Return in about 1 week (around 05/03/2024).   Ortho Exam  Patient is alert, oriented, no adenopathy, well-dressed, normal affect, normal respiratory effort. Proximal wound  measures 5 cm x 2 cm, middle wound measures 5 cm x 2.8 cm and distal wound measures 3 cm x 1 cm.  The medial ankle wound has healed.  There is 100 % granulation tissue in the wound beds.  Palpable DP pulse with improved edema.  He has developed a new wound since his last visit.  Permanent brawny skin changes to the right lower leg.      Imaging: No results found. No images are attached to the encounter.  Labs: Lab Results  Component Value Date   HGBA1C 9.6 (H) 03/25/2024   HGBA1C 8.1 (H) 05/26/2023   HGBA1C 7.3 (H) 01/06/2023    ESRSEDRATE 21 (H) 01/01/2021   ESRSEDRATE 28 (H) 03/21/2018   CRP 0.7 01/01/2021   CRP <0.8 03/21/2018   REPTSTATUS 03/01/2024 FINAL 02/28/2024   GRAMSTAIN  03/21/2018    NO WBC SEEN NO ORGANISMS SEEN Performed at [facility], [address], [place], [place] [address]    CULT >=100,000 COLONIES/mL SERRATIA MARCESCENS (A) 02/28/2024   LABORGA SERRATIA MARCESCENS (A) 02/28/2024     Lab Results  Component Value Date   ALBUMIN 4.0 03/25/2024   ALBUMIN 4.0 05/26/2023   ALBUMIN 3.9 01/06/2023   PREALBUMIN 15.8 (L) 03/21/2018    Lab Results  Component Value Date   MG 1.7 02/24/2021   MG 2.1 06/20/2015   MG 1.7 06/16/2015   Lab Results  Component Value Date   VD25OH 24.46 (L) 05/26/2023    Lab Results  Component Value Date   PREALBUMIN 15.8 (L) 03/21/2018      Latest Ref Rng & Units 04/14/2024    3:17 PM 03/25/2024    3:52 PM 09/04/2023    6:59 AM  CBC EXTENDED  WBC 4.0 - 10.5 K/uL 9.2  9.4  8.3   RBC 4.22 - 5.81 Mil/uL 4.36  4.44  4.94   Hemoglobin 13.0 - 17.0 g/dL 88.0  87.9  86.1   HCT 39.0 - 52.0 % 36.4  37.1  43.3   Platelets 150.0 - 400.0 K/uL 234.0  213.0  178   NEUT# 1.4 - 7.7 K/uL 5.5  5.0    Lymph# 0.7 - 4.0 K/uL 2.6  2.9       There is no height or weight on file to calculate BMI.  Orders:  No orders of the defined types were placed in this encounter.  No orders of the defined types were placed in this encounter.    Procedures: No procedures performed  Clinical Data: No additional findings.  ROS:  All other systems negative, except as noted in the HPI. Review of Systems  Objective: Vital Signs: There were no vitals taken for this visit.  Specialty Comments:  No specialty comments available.  PMFS History: Patient Active Problem List   Diagnosis Date Noted   Acute swimmer's ear of left side 09/22/2023   Otalgia of both ears 09/22/2023   Hyperlipidemia associated with type 2 diabetes mellitus (HCC) 03/25/2022   Positive  colorectal cancer screening using Cologuard test 08/09/2021   Acid indigestion 08/09/2021   Hematochezia 08/09/2021   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 acute cor pulmonale (HCC) 06/05/2021   Pressure injury of skin 02/25/2021   Chronic anticoagulation 02/19/2021   CAD (coronary artery disease) 01/09/2021   Eczema 12/27/2019   Erectile dysfunction 12/27/2019   History of small bowel obstruction 08/20/2018   History of diabetic ulcer of foot 03/21/2018   Non-cardiac chest pain 02/09/2018   Renal stone 05/12/2017   S/P repair of recurrent ventral hernia 04/26/2017   History of pulmonary embolism 07/16/2016   Chronic venous insufficiency 01/25/2015   Incarcerated ventral hernia 12/13/2013   Diabetes mellitus with hyperglycemia (HCC) 03/31/2013   Leukocytosis 03/23/2013   Hyponatremia 03/23/2013   Morbid obesity with body mass index of 40.0-44.9 in adult [facility]) 05/05/2012   Obstructive sleep apnea 05/05/2012   DVT of left distal popliteal vein 05/05/2012   Past Medical History:  Diagnosis Date   CAD (coronary artery disease)    Diabetes mellitus, type II (HCC) 02/2013   Diabetic ulcer of right foot due to type 2 diabetes mellitus (HCC) 03/21/2018   DVT (deep venous thrombosis) (HCC) 05/04/2012   LLE   Exertional dyspnea 05/04/2012   isolated episode (05/05/2012)   Hepatic steatosis    History of chickenpox    Hypertension 03/21/2018   Obesity, Class III, BMI 40-49.9 (morbid obesity) (HCC) 03/21/2018   OSA on CPAP    Peripheral vascular disease    varicose veins   Pulmonary embolism (HCC) 05/04/2012   bilaterally   Pyelonephritis 04/2017   Small bowel obstruction (HCC) 09/06/2015   Varicose veins     Family History  Problem Relation Age of Onset   Diabetes Mother        died age 12- also bleeding ulcer   Hypertension Mother    Heart failure Father        in his 11s (patient was 42). day before surgery planned   Other Maternal Grandmother        states natural  causes all grandparents   Colon cancer Neg Hx    Esophageal cancer Neg Hx    Inflammatory bowel disease Neg Hx    Liver disease Neg Hx    Pancreatic cancer Neg Hx  Rectal cancer Neg Hx    Stomach cancer Neg Hx     Past Surgical History:  Procedure Laterality Date   BOWEL RESECTION N/A 02/20/2021   Procedure: SMALL BOWEL RESECTION;  Surgeon: Stevie, Herlene Righter, MD;  Location[facility] OR;  Service: General;  Laterality: N/A;   CORONARY STENT INTERVENTION N/A 01/02/2021   Procedure: CORONARY STENT INTERVENTION;  Surgeon: Dann Candyce RAMAN, MD;  Location: [facility] INVAS[facility] CV LAB;  Service: Cardiovascular;  Laterality: N/A;   CYSTOSCOPY W/ URETERAL STENT PLACEMENT Right 05/12/2017   Procedure: CYSTOSCOPY WITH RETROGRADE PYELOGRAM/URETERAL STENT PLACEMENT;  Surgeon: Watt Rush, MD;  Location: [facility] OR;  Service: Urology;  Laterality: Right;   CYSTOSCOPY/URETEROSCOPY/HOLMIUM LASER/STENT PLACEMENT Right 07/29/2017   Procedure: CYSTOSCOPY RIGHT URETEROSCOPY HOLMIUM LASER STENT EXCHANGE;  Surgeon: Watt Rush, MD;  Location: [facility];  Service: Urology;  Laterality: Right;   HERNIA REPAIR     2015   LAPAROTOMY N/A 12/13/2013   Procedure: EXPLORATORY LAPAROTOMY Repair ventral hernia, without mesh, partial omentectomy;  Surgeon: Lynwood MALVA Pina, MD;  Location: [facility] OR;  Service: General;  Laterality: N/A;   LEFT HEART CATH AND CORONARY ANGIOGRAPHY N/A 01/02/2021   Procedure: LEFT HEART CATH AND CORONARY ANGIOGRAPHY;  Surgeon: Dann Candyce RAMAN, MD;  Location: [facility] INVASIVE CV LAB;  Service: Cardiovascular;  Laterality: N/A;   LYSIS OF ADHESION  02/20/2021   Procedure: LYSIS OF ADHESION;  Surgeon: Stevie, Herlene Righter, MD;  Location: [facility] OR;  Service: General;;   VENTRAL HERNIA REPAIR N/A 04/26/2017   Procedure: INCARCERATED HERNIA REPAIR VENTRAL ADULT;  Surgeon: Sebastian Moles, MD;  Location: [facility] OR;  Service: General;  Laterality: N/A;   VENTRAL HERNIA REPAIR N/A 02/20/2021   Procedure: EXPLORATORY LAPAROTOMY;  RECURRENT HERNIA REPAIR WITH OVERLAY MESH;  Surgeon: Kinsinger, Herlene Righter, MD;  Location: [facility] OR;  Service: General;  Laterality: N/A;   Social History   Occupational History   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

## 2024-04-28 ENCOUNTER — Other Ambulatory Visit: Payer: Self-pay | Admitting: Family Medicine

## 2024-04-28 ENCOUNTER — Telehealth: Payer: Self-pay | Admitting: Family Medicine

## 2024-04-28 DIAGNOSIS — D649 Anemia, unspecified: Secondary | ICD-10-CM

## 2024-04-28 NOTE — Telephone Encounter (Signed)
 Please see patient message below regarding question from lab results   Copied from CRM #[id number]. Topic: Clinical - Lab/Test Results >> Apr 28, 2024  9:51 AM Dedra B wrote: Reason for CRM: Pt returning call regarding results. Relayed message verbatim. Pt said he is not having any hemorrhoidal issues.

## 2024-04-28 NOTE — Telephone Encounter (Signed)
 Ok lets move forward with the gastroenterology referral and also have him recheck CBC in 2 weeks under anemia unspecified to make sure not worsening

## 2024-04-28 NOTE — Telephone Encounter (Signed)
 Referral sent to GI 04/27/2024. Lab orders placed to be done in 2 weeks 04/28/2024.

## 2024-05-03 ENCOUNTER — Ambulatory Visit: Payer: PRIVATE HEALTH INSURANCE | Admitting: Physician Assistant

## 2024-05-03 ENCOUNTER — Encounter: Payer: Self-pay | Admitting: Physician Assistant

## 2024-05-03 DIAGNOSIS — I87331 Chronic venous hypertension (idiopathic) with ulcer and inflammation of right lower extremity: Secondary | ICD-10-CM

## 2024-05-03 NOTE — Progress Notes (Addendum)
 Office Visit Note   [name]           Date of Birth: 24-Aug-1960           MRN: [id number] Visit Date: 05/03/2024              Requested by: Katrinka Garnette KIDD, MD [address] [place],  [place] [address] PCP: Katrinka Garnette KIDD, MD  Chief Complaint  Patient presents with   Right Leg - Follow-up      HPI: Evan Leonard is a 63 year old male who presents with a non-healing wound on his calf and an ankle ulcer.    Patient is not a candidate for the Kerecis study due to the inclusion criteria requiring BMI to less than 40.  He had developed a new venous ulcer so we discontinued the Dynaflex wrap and changed him to a wet to dry Vashe dressing daily so he can check his skin.     Assessment & Plan: Visit Diagnoses:  1. Chronic venous hypertension (idiopathic) with ulcer and inflammation of right lower extremity (HCC)     Plan: The wounds are getting smaller.  I will continue the wet to dry daily dressing changes, elevation when able in the supine position multiple times a day.  We will review the reflux study and make further recommendations.  If the venous study is negative we will measure for lymphedema pumps.  His biggest obstacles is that he works on his feet daily at [employer]. I did give him a hand out to demonstrate proper elevation in the supine position.   Follow-Up Instructions: Return in about 1 week (around 05/10/2024).   Ortho Exam  Patient is alert, oriented, no adenopathy, well-dressed, normal affect, normal respiratory effort. Proximal wound measures 1.7 x 1.2 cm with a skin island 2.3 x 1 cm middle wound measures 5 cm x 3.3 cm and distal wound measures 1 cm x 1 cm. The medial ankle wound has healed. There is 100 % granulation tissue in the wound beds. Palpable DP pulse with improved edema.  He has active weeping of the skin while sitting in the office.   Permanent brawny skin changes to the right lower leg.  After verbal consent  fibrinous tissue was debrided from the middle wound bed to reveal additional flat granulation tissue.        Imaging: No results found. No images are attached to the encounter.  Labs: Lab Results  Component Value Date   HGBA1C 9.6 (H) 03/25/2024   HGBA1C 8.1 (H) 05/26/2023   HGBA1C 7.3 (H) 01/06/2023   ESRSEDRATE 21 (H) 01/01/2021   ESRSEDRATE 28 (H) 03/21/2018   CRP 0.7 01/01/2021   CRP <0.8 03/21/2018   REPTSTATUS 03/01/2024 FINAL 02/28/2024   GRAMSTAIN  03/21/2018    NO WBC SEEN NO ORGANISMS SEEN Performed at [facility], [address], [place], [place] [address]    CULT >=100,000 COLONIES/mL SERRATIA MARCESCENS (A) 02/28/2024   LABORGA SERRATIA MARCESCENS (A) 02/28/2024     Lab Results  Component Value Date   ALBUMIN 4.0 03/25/2024   ALBUMIN 4.0 05/26/2023   ALBUMIN 3.9 01/06/2023   PREALBUMIN 15.8 (L) 03/21/2018    Lab Results  Component Value Date   MG 1.7 02/24/2021   MG 2.1 06/20/2015   MG 1.7 06/16/2015   Lab Results  Component Value Date   VD25OH 24.46 (L) 05/26/2023    Lab Results  Component Value Date   PREALBUMIN  15.8 (L) 03/21/2018      Latest Ref Rng & Units 04/14/2024    3:17 PM 03/25/2024    3:52 PM 09/04/2023    6:59 AM  CBC EXTENDED  WBC 4.0 - 10.5 K/uL 9.2  9.4  8.3   RBC 4.22 - 5.81 Mil/uL 4.36  4.44  4.94   Hemoglobin 13.0 - 17.0 g/dL 88.0  87.9  86.1   HCT 39.0 - 52.0 % 36.4  37.1  43.3   Platelets 150.0 - 400.0 K/uL 234.0  213.0  178   NEUT# 1.4 - 7.7 K/uL 5.5  5.0    Lymph# 0.7 - 4.0 K/uL 2.6  2.9       There is no height or weight on file to calculate BMI.  Orders:  Orders Placed This Encounter  Procedures   VAS US  LOWER EXTREMITY VENOUS REFLUX   No orders of the defined types were placed in this encounter.    Procedures: No procedures performed  Clinical Data: No additional findings.  ROS:  All other systems negative, except as noted in the HPI. Review of Systems  Objective: Vital Signs:  There were no vitals taken for this visit.  Specialty Comments:  No specialty comments available.  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 acute cor pulmonale (HCC) 06/05/2021   Pressure injury of skin 02/25/2021   Chronic anticoagulation 02/19/2021   CAD (coronary artery disease) 01/09/2021   Eczema 12/27/2019   Erectile dysfunction 12/27/2019   History of small bowel obstruction 08/20/2018   History of diabetic ulcer of foot 03/21/2018   Non-cardiac chest pain 02/09/2018   Renal stone 05/12/2017   S/P repair of recurrent ventral hernia 04/26/2017   History of pulmonary embolism 07/16/2016   Chronic venous insufficiency 01/25/2015   Incarcerated ventral hernia 12/13/2013   Diabetes mellitus with hyperglycemia (HCC) 03/31/2013   Leukocytosis 03/23/2013   Hyponatremia 03/23/2013   Morbid obesity with body mass index of 40.0-44.9 in adult [facility]) 05/05/2012   Obstructive sleep apnea 05/05/2012   DVT of left distal popliteal vein 05/05/2012   Past Medical History:  Diagnosis Date   CAD (coronary artery disease)    Diabetes mellitus, type II (HCC) 02/2013   Diabetic ulcer of right foot due to type 2 diabetes mellitus (HCC) 03/21/2018   DVT (deep venous thrombosis) (HCC) 05/04/2012   LLE   Exertional dyspnea 05/04/2012   isolated episode (05/05/2012)   Hepatic steatosis    History of chickenpox    Hypertension 03/21/2018   Obesity, Class III, BMI 40-49.9 (morbid obesity) (HCC) 03/21/2018   OSA on CPAP    Peripheral vascular disease    varicose veins   Pulmonary embolism (HCC) 05/04/2012   bilaterally   Pyelonephritis 04/2017    Small bowel obstruction (HCC) 09/06/2015   Varicose veins     Family History  Problem Relation Age of Onset   Diabetes Mother        died age 86- also bleeding ulcer   Hypertension Mother    Heart failure Father        in his 30s (patient was 31). day before surgery planned   Other Maternal Grandmother  states natural causes all grandparents   Colon cancer Neg Hx    Esophageal cancer Neg Hx    Inflammatory bowel disease Neg Hx    Liver disease Neg Hx    Pancreatic cancer Neg Hx    Rectal cancer Neg Hx    Stomach cancer Neg Hx     Past Surgical History:  Procedure Laterality Date   BOWEL RESECTION N/A 02/20/2021   Procedure: SMALL BOWEL RESECTION;  Surgeon: Stevie, Herlene Righter, MD;  Location: [facility] OR;  Service: General;  Laterality: N/A;   CORONARY STENT INTERVENTION N/A 01/02/2021   Procedure: CORONARY STENT INTERVENTION;  Surgeon: Dann Candyce RAMAN, MD;  Location: [facility] INVASIVE CV LAB;  Service: Cardiovascular;  Laterality: N/A;   CYSTOSCOPY W/ URETERAL STENT PLACEMENT Right 05/12/2017   Procedure: CYSTOSCOPY WITH RETROGRADE PYELOGRAM/URETERAL STENT PLACEMENT;  Surgeon: Watt Rush, MD;  Location: [facility] OR;  Service: Urology;  Laterality: Right;   CYSTOSCOPY/URETEROSCOPY/HOLMIUM LASER/STENT PLACEMENT Right 07/29/2017   Procedure: CYSTOSCOPY RIGHT URETEROSCOPY HOLMIUM LASER STENT EXCHANGE;  Surgeon: Watt Rush, MD;  Location: [facility] ORS;  Service: Urology;  Laterality: Right;   HERNIA REPAIR     2015   LAPAROTOMY N/A 12/13/2013   Procedure: EXPLORATORY LAPAROTOMY Repair ventral hernia, without mesh, partial omentectomy;  Surgeon: Lynwood MALVA Pina, MD;  Location: [facility] OR;  Service: General;  Laterality: N/A;   LEFT HEART CATH AND CORONARY ANGIOGRAPHY N/A 01/02/2021   Procedure: LEFT HEART CATH AND CORONARY ANGIOGRAPHY;  Surgeon: Dann Candyce RAMAN, MD;  Location: [facility] INVASIVE CV LAB;  Service: Cardiovascular;  Laterality: N/A;   LYSIS OF ADHESION  02/20/2021   Procedure: LYSIS OF ADHESION;   Surgeon: Stevie, Herlene Righter, MD;  Location: [facility] OR;  Service: General;;   VENTRAL HERNIA REPAIR N/A 04/26/2017   Procedure: INCARCERATED HERNIA REPAIR VENTRAL ADULT;  Surgeon: Sebastian Moles, MD;  Location: [facility] OR;  Service: General;  Laterality: N/A;   VENTRAL HERNIA REPAIR N/A 02/20/2021   Procedure: EXPLORATORY LAPAROTOMY; RECURRENT HERNIA REPAIR WITH OVERLAY MESH;  Surgeon: Kinsinger, Herlene Righter, MD;  Location: [facility] OR;  Service: General;  Laterality: N/A;   Social History   Occupational History   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

## 2024-05-06 ENCOUNTER — Ambulatory Visit (HOSPITAL_COMMUNITY)
Admission: RE | Admit: 2024-05-06 | Discharge: 2024-05-06 | Disposition: A | Payer: PRIVATE HEALTH INSURANCE | Source: Ambulatory Visit | Attending: Physician Assistant | Admitting: Physician Assistant

## 2024-05-06 DIAGNOSIS — I87331 Chronic venous hypertension (idiopathic) with ulcer and inflammation of right lower extremity: Secondary | ICD-10-CM | POA: Insufficient documentation

## 2024-05-10 ENCOUNTER — Ambulatory Visit: Payer: PRIVATE HEALTH INSURANCE | Admitting: Orthopedic Surgery

## 2024-05-10 DIAGNOSIS — I87331 Chronic venous hypertension (idiopathic) with ulcer and inflammation of right lower extremity: Secondary | ICD-10-CM

## 2024-05-11 ENCOUNTER — Encounter: Payer: Self-pay | Admitting: Orthopedic Surgery

## 2024-05-11 NOTE — Progress Notes (Signed)
 Office Visit Note   [name]           Date of Birth: 1961/02/18           MRN: [id number] Visit Date: 05/10/2024              Requested by: Katrinka Garnette KIDD, MD [address] [place],  [place] [address] PCP: Katrinka Garnette KIDD, MD  Chief Complaint  Patient presents with   Right Leg - Follow-up      HPI: Discussed the use of AI scribe software for clinical note transcription with the patient, who gave verbal consent to proceed.  History of Present Illness Evan Leonard is a 63 year old male with venous insufficiency who presents with ulcerations on the right lower extremity.  He has significant swelling in the right leg with weeping edema from multiple wounds. The largest ulcer measures 4.5 by 3 centimeters. He is currently taking Eliquis .  A venous ultrasound revealed an old clot in the right proximal femur and popliteal vein, as well as venous reflux in the greater and short saphenous veins.  He has enlarged lymph nodes in the groin area.  His occupation requires prolonged standing.     Assessment & Plan: Visit Diagnoses:  1. Chronic venous hypertension (idiopathic) with ulcer and inflammation of right lower extremity (HCC)     Plan: Assessment and Plan Assessment & Plan Venous insufficiency with ulceration of the right lower extremity Chronic venous insufficiency with ulceration. Largest ulcer 4.5x3 cm. Venous reflux in greater and short saphenous veins. Healing expected with time. - Applied multilayer compression wrap to the right lower extremity. - Change compression wrap weekly. - Elevate leg when sitting or lying down. - Encourage walking to promote venous return.  Chronic deep vein thrombosis of the right femoral and popliteal veins Chronic thrombus in right proximal femoral and popliteal veins. Eliquis  optimal for management. - Continue Eliquis  for management of chronic thrombus.  Lymphedema of the right lower  extremity Lymphedema with enlarged groin lymph nodes. Significant swelling requires management for ulcer healing. - Apply multilayer compression wrap to manage swelling. - Elevate leg to reduce lymphedema.      Follow-Up Instructions: Return in about 1 week (around 05/17/2024).   Ortho Exam  Patient is alert, oriented, no adenopathy, well-dressed, normal affect, normal respiratory effort. Physical Exam NECK: Enlarged lymph nodes in the groin. EXTREMITIES: Right lower extremity with weeping edema from multiple wounds. Largest ulcer 4.5x3 cm.  Most recent hemoglobin A1c 9.6.      Imaging: No results found. No images are attached to the encounter.  Labs: Lab Results  Component Value Date   HGBA1C 9.6 (H) 03/25/2024   HGBA1C 8.1 (H) 05/26/2023   HGBA1C 7.3 (H) 01/06/2023   ESRSEDRATE 21 (H) 01/01/2021   ESRSEDRATE 28 (H) 03/21/2018   CRP 0.7 01/01/2021   CRP <0.8 03/21/2018   REPTSTATUS 03/01/2024 FINAL 02/28/2024   GRAMSTAIN  03/21/2018    NO WBC SEEN NO ORGANISMS SEEN Performed at [facility], [address] [place], [place] [address]    CULT >=100,000 COLONIES/mL SERRATIA MARCESCENS (A) 02/28/2024   LABORGA SERRATIA MARCESCENS (A) 02/28/2024     Lab Results  Component Value Date   ALBUMIN 4.0 03/25/2024   ALBUMIN 4.0 05/26/2023   ALBUMIN 3.9 01/06/2023   PREALBUMIN 15.8 (L) 03/21/2018    Lab Results  Component Value Date   MG 1.7 02/24/2021   MG 2.1 06/20/2015   MG 1.7 06/16/2015  Lab Results  Component Value Date   VD25OH 24.46 (L) 05/26/2023    Lab Results  Component Value Date   PREALBUMIN 15.8 (L) 03/21/2018      Latest Ref Rng & Units 04/14/2024    3:17 PM 03/25/2024    3:52 PM 09/04/2023    6:59 AM  CBC EXTENDED  WBC 4.0 - 10.5 K/uL 9.2  9.4  8.3   RBC 4.22 - 5.81 Mil/uL 4.36  4.44  4.94   Hemoglobin 13.0 - 17.0 g/dL 88.0  87.9  86.1   HCT 39.0 - 52.0 % 36.4  37.1  43.3   Platelets 150.0 - 400.0 K/uL 234.0  213.0  178   NEUT#  1.4 - 7.7 K/uL 5.5  5.0    Lymph# 0.7 - 4.0 K/uL 2.6  2.9       There is no height or weight on file to calculate BMI.  Orders:  No orders of the defined types were placed in this encounter.  No orders of the defined types were placed in this encounter.    Procedures: No procedures performed  Clinical Data: No additional findings.  ROS:  All other systems negative, except as noted in the HPI. Review of Systems  Objective: Vital Signs: There were no vitals taken for this visit.  Specialty Comments:  No specialty comments available.  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 acute cor pulmonale (HCC) 06/05/2021   Pressure injury of skin 02/25/2021   Chronic anticoagulation 02/19/2021   CAD (coronary artery disease) 01/09/2021   Eczema 12/27/2019   Erectile dysfunction 12/27/2019   History of small bowel obstruction 08/20/2018   History of diabetic ulcer of foot 03/21/2018   Non-cardiac chest pain 02/09/2018   Renal stone 05/12/2017   S/P repair of recurrent ventral hernia 04/26/2017   History of pulmonary embolism 07/16/2016   Chronic venous insufficiency 01/25/2015   Incarcerated ventral hernia 12/13/2013   Diabetes mellitus with hyperglycemia (HCC) 03/31/2013   Leukocytosis 03/23/2013   Hyponatremia 03/23/2013   Morbid obesity with body mass index of 40.0-44.9 in adult [facility]) 05/05/2012   Obstructive sleep apnea 05/05/2012   DVT of left distal popliteal vein 05/05/2012   Past Medical History:  Diagnosis Date   CAD (coronary artery disease)    Diabetes mellitus, type II (HCC) 02/2013   Diabetic ulcer of right foot  due to type 2 diabetes mellitus (HCC) 03/21/2018   DVT (deep venous thrombosis) (HCC) 05/04/2012   LLE   Exertional dyspnea 05/04/2012   isolated episode (05/05/2012)   Hepatic steatosis    History of chickenpox    Hypertension 03/21/2018   Obesity, Class III, BMI 40-49.9 (morbid obesity) (HCC) 03/21/2018   OSA on CPAP    Peripheral vascular disease    varicose veins   Pulmonary embolism (HCC) 05/04/2012   bilaterally   Pyelonephritis 04/2017   Small bowel obstruction (HCC) 09/06/2015   Varicose veins     Family History  Problem Relation Age of Onset   Diabetes Mother        died age 96- also bleeding ulcer   Hypertension Mother    Heart failure Father  in his 70s (patient was 61). day before surgery planned   Other Maternal Grandmother        states natural causes all grandparents   Colon cancer Neg Hx    Esophageal cancer Neg Hx    Inflammatory bowel disease Neg Hx    Liver disease Neg Hx    Pancreatic cancer Neg Hx    Rectal cancer Neg Hx    Stomach cancer Neg Hx     Past Surgical History:  Procedure Laterality Date   BOWEL RESECTION N/A 02/20/2021   Procedure: SMALL BOWEL RESECTION;  Surgeon: Stevie, Herlene Righter, MD;  Location: [facility] OR;  Service: General;  Laterality: N/A;   CORONARY STENT INTERVENTION N/A 01/02/2021   Procedure: CORONARY STENT INTERVENTION;  Surgeon: Dann Candyce RAMAN, MD;  Location: [facility] INVAS[facility] CV LAB;  Service: Cardiovascular;  Laterality: N/A;   CYSTOSCOPY W/ URETERAL STENT PLACEMENT Right 05/12/2017   Procedure: CYSTOSCOPY WITH RETROGRADE PYELOGRAM/URETERAL STENT PLACEMENT;  Surgeon: Watt Rush, MD;  Location: [facility] OR;  Service: Urology;  Laterality: Right;   CYSTOSCOPY/URETEROSCOPY/HOLMIUM LASER/STENT PLACEMENT Right 07/29/2017   Procedure: CYSTOSCOPY RIGHT URETEROSCOPY HOLMIUM LASER STENT EXCHANGE;  Surgeon: Watt Rush, MD;  Location: [facility];  Service: Urology;  Laterality: Right;   HERNIA REPAIR     2015   LAPAROTOMY N/A 12/13/2013    Procedure: EXPLORATORY LAPAROTOMY Repair ventral hernia, without mesh, partial omentectomy;  Surgeon: Lynwood MALVA Pina, MD;  Location: [facility] OR;  Service: General;  Laterality: N/A;   LEFT HEART CATH AND CORONARY ANGIOGRAPHY N/A 01/02/2021   Procedure: LEFT HEART CATH AND CORONARY ANGIOGRAPHY;  Surgeon: Dann Candyce RAMAN, MD;  Location: [facility] INVASIVE CV LAB;  Service: Cardiovascular;  Laterality: N/A;   LYSIS OF ADHESION  02/20/2021   Procedure: LYSIS OF ADHESION;  Surgeon: Stevie, Herlene Righter, MD;  Location: [facility] OR;  Service: General;;   VENTRAL HERNIA REPAIR N/A 04/26/2017   Procedure: INCARCERATED HERNIA REPAIR VENTRAL ADULT;  Surgeon: Sebastian Moles, MD;  Location: [facility] OR;  Service: General;  Laterality: N/A;   VENTRAL HERNIA REPAIR N/A 02/20/2021   Procedure: EXPLORATORY LAPAROTOMY; RECURRENT HERNIA REPAIR WITH OVERLAY MESH;  Surgeon: Kinsinger, Herlene Righter, MD;  Location: [facility] OR;  Service: General;  Laterality: N/A;   Social History   Occupational History   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

## 2024-05-17 ENCOUNTER — Encounter: Payer: Self-pay | Admitting: Orthopedic Surgery

## 2024-05-17 ENCOUNTER — Ambulatory Visit: Payer: PRIVATE HEALTH INSURANCE | Admitting: Orthopedic Surgery

## 2024-05-17 DIAGNOSIS — I87331 Chronic venous hypertension (idiopathic) with ulcer and inflammation of right lower extremity: Secondary | ICD-10-CM | POA: Diagnosis not present

## 2024-05-17 NOTE — Progress Notes (Signed)
 "  Office Visit Note   [name]           Date of Birth: 1961-05-04           MRN: [id number] Visit Date: 05/17/2024              Requested by: Katrinka Garnette KIDD, MD [address] [place],  [place] [address] PCP: Katrinka Garnette KIDD, MD  Chief Complaint  Patient presents with   Right Leg - Follow-up      HPI: Discussed the use of AI scribe software for clinical note transcription with the patient, who gave verbal consent to proceed.  History of Present Illness Evan Leonard is a 63 year old male with a chronic lower extremity wound who presents for follow-up of wound healing.  He is undergoing regular wound care. He denies new symptoms, pain, or complications.  He has not experienced fever, drainage, or other signs of infection. He remains active and is able to participate in family gatherings.     Assessment & Plan: Visit Diagnoses: No diagnosis found.  Plan: Assessment and Plan Assessment & Plan Chronic lower extremity wound Chronic wound 3 x 5 cm with healthy granulation tissue and new superficial epithelization, showing improvement. - Applied silver  nitrate to granulation tissue. - Reapplied multilayer compression wrap. - Scheduled follow-up in one week.      Follow-Up Instructions: No follow-ups on file.   Ortho Exam  Patient is alert, oriented, no adenopathy, well-dressed, normal affect, normal respiratory effort. Physical Exam SKIN: Wound 3x5 cm with flat, healthy granulation tissue. Superficial epithetization in middle of wound. Granulation tissue touched with silver  nitrate.      Imaging: No results found. No images are attached to the encounter.  Labs: Lab Results  Component Value Date   HGBA1C 9.6 (H) 03/25/2024   HGBA1C 8.1 (H) 05/26/2023   HGBA1C 7.3 (H) 01/06/2023   ESRSEDRATE 21 (H) 01/01/2021   ESRSEDRATE 28 (H) 03/21/2018   CRP 0.7 01/01/2021   CRP <0.8 03/21/2018   REPTSTATUS 03/01/2024 FINAL 02/28/2024    GRAMSTAIN  03/21/2018    NO WBC SEEN NO ORGANISMS SEEN Performed at [facility], [address], [place], [place] [address]    CULT >=100,000 COLONIES/mL SERRATIA MARCESCENS (A) 02/28/2024   LABORGA SERRATIA MARCESCENS (A) 02/28/2024     Lab Results  Component Value Date   ALBUMIN 4.0 03/25/2024   ALBUMIN 4.0 05/26/2023   ALBUMIN 3.9 01/06/2023   PREALBUMIN 15.8 (L) 03/21/2018    Lab Results  Component Value Date   MG 1.7 02/24/2021   MG 2.1 06/20/2015   MG 1.7 06/16/2015   Lab Results  Component Value Date   VD25OH 24.46 (L) 05/26/2023    Lab Results  Component Value Date   PREALBUMIN 15.8 (L) 03/21/2018      Latest Ref Rng & Units 04/14/2024    3:17 PM 03/25/2024    3:52 PM 09/04/2023    6:59 AM  CBC EXTENDED  WBC 4.0 - 10.5 K/uL 9.2  9.4  8.3   RBC 4.22 - 5.81 Mil/uL 4.36  4.44  4.94   Hemoglobin 13.0 - 17.0 g/dL 88.0  87.9  86.1   HCT 39.0 - 52.0 % 36.4  37.1  43.3   Platelets 150.0 - 400.0 K/uL 234.0  213.0  178   NEUT# 1.4 - 7.7 K/uL 5.5  5.0    Lymph# 0.7 - 4.0 K/uL 2.6  2.9  There is no height or weight on file to calculate BMI.  Orders:  No orders of the defined types were placed in this encounter.  No orders of the defined types were placed in this encounter.    Procedures: No procedures performed  Clinical Data: No additional findings.  ROS:  All other systems negative, except as noted in the HPI. Review of Systems  Objective: Vital Signs: There were no vitals taken for this visit.  Specialty Comments:  No specialty comments available.  PMFS History: Patient Active Problem List   Diagnosis Date Noted   Acute swimmer's ear of left side 09/22/2023   Otalgia of both ears 09/22/2023   Hyperlipidemia associated with type 2 diabetes mellitus (HCC) 03/25/2022   Positive colorectal cancer screening using Cologuard test 08/09/2021   Acid indigestion 08/09/2021   Hematochezia 08/09/2021   Other chronic pulmonary embolism  without acute cor pulmonale (HCC) 06/05/2021   Pressure injury of skin 02/25/2021   Chronic anticoagulation 02/19/2021   CAD (coronary artery disease) 01/09/2021   Eczema 12/27/2019   Erectile dysfunction 12/27/2019   History of small bowel obstruction 08/20/2018   History of diabetic ulcer of foot 03/21/2018   Non-cardiac chest pain 02/09/2018   Renal stone 05/12/2017   S/P repair of recurrent ventral hernia 04/26/2017   History of pulmonary embolism 07/16/2016   Chronic venous insufficiency 01/25/2015   Incarcerated ventral hernia 12/13/2013   Diabetes mellitus with hyperglycemia (HCC) 03/31/2013   Leukocytosis 03/23/2013   Hyponatremia 03/23/2013   Morbid obesity with body mass index of 40.0-44.9 in adult [facility]) 05/05/2012   Obstructive sleep apnea 05/05/2012   DVT of left distal popliteal vein 05/05/2012   Past Medical History:  Diagnosis Date   CAD (coronary artery disease)    Diabetes mellitus, type II (HCC) 02/2013   Diabetic ulcer of right foot due to type 2 diabetes mellitus (HCC) 03/21/2018   DVT (deep venous thrombosis) (HCC) 05/04/2012   LLE   Exertional dyspnea 05/04/2012   isolated episode (05/05/2012)   Hepatic steatosis    History of chickenpox    Hypertension 03/21/2018   Obesity, Class III, BMI 40-49.9 (morbid obesity) (HCC) 03/21/2018   OSA on CPAP    Peripheral vascular disease    varicose veins   Pulmonary embolism (HCC) 05/04/2012   bilaterally   Pyelonephritis 04/2017   Small bowel obstruction (HCC) 09/06/2015   Varicose veins     Family History  Problem Relation Age of Onset   Diabetes Mother        died age 43- also bleeding ulcer   Hypertension Mother    Heart failure Father        in his 17s (patient was 63). day before surgery planned   Other Maternal Grandmother        states natural causes all grandparents   Colon cancer Neg Hx    Esophageal cancer Neg Hx    Inflammatory bowel disease Neg Hx    Liver disease Neg Hx    Pancreatic  cancer Neg Hx    Rectal cancer Neg Hx    Stomach cancer Neg Hx     Past Surgical History:  Procedure Laterality Date   BOWEL RESECTION N/A 02/20/2021   Procedure: SMALL BOWEL RESECTION;  Surgeon: Stevie, Herlene Righter, MD;  Location: [facility] OR;  Service: General;  Laterality: N/A;   CORONARY STENT INTERVENTION N/A 01/02/2021   Procedure: CORONARY STENT INTERVENTION;  Surgeon: Dann Candyce RAMAN, MD;  Location: [facility] INVASIVE CV LAB;  Service:  Cardiovascular;  Laterality: N/A;   CYSTOSCOPY W/ URETERAL STENT PLACEMENT Right 05/12/2017   Procedure: CYSTOSCOPY WITH RETROGRADE PYELOGRAM/URETERAL STENT PLACEMENT;  Surgeon: Watt Rush, MD;  Location: [facility] OR;  Service: Urology;  Laterality: Right;   CYSTOSCOPY/URETEROSCOPY/HOLMIUM LASER/STENT PLACEMENT Right 07/29/2017   Procedure: CYSTOSCOPY RIGHT URETEROSCOPY HOLMIUM LASER STENT EXCHANGE;  Surgeon: Watt Rush, MD;  Location: [facility] ORS;  Service: Urology;  Laterality: Right;   HERNIA REPAIR     2015   LAPAROTOMY N/A 12/13/2013   Procedure: EXPLORATORY LAPAROTOMY Repair ventral hernia, without mesh, partial omentectomy;  Surgeon: Lynwood MALVA Pina, MD;  Location: [facility] OR;  Service: General;  Laterality: N/A;   LEFT HEART CATH AND CORONARY ANGIOGRAPHY N/A 01/02/2021   Procedure: LEFT HEART CATH AND CORONARY ANGIOGRAPHY;  Surgeon: Dann Candyce RAMAN, MD;  Location: [facility] INVASIVE CV LAB;  Service: Cardiovascular;  Laterality: N/A;   LYSIS OF ADHESION  02/20/2021   Procedure: LYSIS OF ADHESION;  Surgeon: Stevie, Herlene Righter, MD;  Location: [facility] OR;  Service: General;;   VENTRAL HERNIA REPAIR N/A 04/26/2017   Procedure: INCARCERATED HERNIA REPAIR VENTRAL ADULT;  Surgeon: Sebastian Moles, MD;  Location: [facility] OR;  Service: General;  Laterality: N/A;   VENTRAL HERNIA REPAIR N/A 02/20/2021   Procedure: EXPLORATORY LAPAROTOMY; RECURRENT HERNIA REPAIR WITH OVERLAY MESH;  Surgeon: Kinsinger, Herlene Righter, MD;  Location: [facility] OR;  Service: General;  Laterality: N/A;   Social History    Occupational History   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

## 2024-05-24 ENCOUNTER — Ambulatory Visit: Payer: PRIVATE HEALTH INSURANCE | Admitting: Physician Assistant

## 2024-05-24 ENCOUNTER — Encounter: Payer: Self-pay | Admitting: Physician Assistant

## 2024-05-24 DIAGNOSIS — I87331 Chronic venous hypertension (idiopathic) with ulcer and inflammation of right lower extremity: Secondary | ICD-10-CM | POA: Diagnosis not present

## 2024-05-24 NOTE — Progress Notes (Signed)
 "  Office Visit Note   [name]           Date of Birth: 10/20/1960           MRN: [id number] Visit Date: 05/24/2024              Requested by: Katrinka Garnette KIDD, MD [address] [place],  [place] [address] PCP: Katrinka Garnette KIDD, MD  No chief complaint on file.     HPI: Evan Leonard is a 63 year old male who presents with a non-healing wound on his calf and an ankle ulcer.    Patient is not a candidate for the Kerecis study due to the inclusion criteria requiring BMI to less than 40.  We have been using compression dressings weekly.    He was a little worried this week secondary to increased odor and bleeding through the dressing.    Assessment & Plan: Visit Diagnoses:  1. Chronic venous hypertension (idiopathic) with ulcer and inflammation of right lower extremity (HCC)     Plan:  -Venous wound left LE - Reapplied multilayer compression wrap. - Scheduled follow-up in one week.  Follow-Up Instructions: Return in about 1 week (around 05/31/2024).   Ortho Exam  Patient is alert, oriented, no adenopathy, well-dressed, normal affect, normal respiratory effort. The bleeding was stopped with a silver  nitrate stick.  The anterior shin measures 4 cm x 2.5 cm, middle wound measures 1 cm x 1 cm, and the distal wound measures 1.5 cm x 1 cm.       Imaging: No results found. No images are attached to the encounter.  Labs: Lab Results  Component Value Date   HGBA1C 9.6 (H) 03/25/2024   HGBA1C 8.1 (H) 05/26/2023   HGBA1C 7.3 (H) 01/06/2023   ESRSEDRATE 21 (H) 01/01/2021   ESRSEDRATE 28 (H) 03/21/2018   CRP 0.7 01/01/2021   CRP <0.8 03/21/2018   REPTSTATUS 03/01/2024 FINAL 02/28/2024   GRAMSTAIN  03/21/2018    NO WBC SEEN NO ORGANISMS SEEN Performed at [facility], [address] [place], [place] [address]    CULT >=100,000 COLONIES/mL SERRATIA MARCESCENS (A) 02/28/2024   LABORGA SERRATIA MARCESCENS (A) 02/28/2024     Lab Results   Component Value Date   ALBUMIN 4.0 03/25/2024   ALBUMIN 4.0 05/26/2023   ALBUMIN 3.9 01/06/2023   PREALBUMIN 15.8 (L) 03/21/2018    Lab Results  Component Value Date   MG 1.7 02/24/2021   MG 2.1 06/20/2015   MG 1.7 06/16/2015   Lab Results  Component Value Date   VD25OH 24.46 (L) 05/26/2023    Lab Results  Component Value Date   PREALBUMIN 15.8 (L) 03/21/2018      Latest Ref Rng & Units 04/14/2024    3:17 PM 03/25/2024    3:52 PM 09/04/2023    6:59 AM  CBC EXTENDED  WBC 4.0 - 10.5 K/uL 9.2  9.4  8.3   RBC 4.22 - 5.81 Mil/uL 4.36  4.44  4.94   Hemoglobin 13.0 - 17.0 g/dL 88.0  87.9  86.1   HCT 39.0 - 52.0 % 36.4  37.1  43.3   Platelets 150.0 - 400.0 K/uL 234.0  213.0  178   NEUT# 1.4 - 7.7 K/uL 5.5  5.0    Lymph# 0.7 - 4.0 K/uL 2.6  2.9       There is no height or weight on file to calculate BMI.  Orders:  No orders of the defined types  were placed in this encounter.  No orders of the defined types were placed in this encounter.    Procedures: No procedures performed  Clinical Data: No additional findings.  ROS:  All other systems negative, except as noted in the HPI. Review of Systems  Objective: Vital Signs: There were no vitals taken for this visit.  Specialty Comments:  No specialty comments available.  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 acute cor pulmonale (HCC) 06/05/2021   Pressure injury of skin 02/25/2021   Chronic anticoagulation 02/19/2021   CAD (coronary artery disease) 01/09/2021   Eczema 12/27/2019   Erectile dysfunction 12/27/2019   History of small bowel obstruction  08/20/2018   History of diabetic ulcer of foot 03/21/2018   Non-cardiac chest pain 02/09/2018   Renal stone 05/12/2017   S/P repair of recurrent ventral hernia 04/26/2017   History of pulmonary embolism 07/16/2016   Chronic venous insufficiency 01/25/2015   Incarcerated ventral hernia 12/13/2013   Diabetes mellitus with hyperglycemia (HCC) 03/31/2013   Leukocytosis 03/23/2013   Hyponatremia 03/23/2013   Morbid obesity with body mass index of 40.0-44.9 in adult [facility]) 05/05/2012   Obstructive sleep apnea 05/05/2012   DVT of left distal popliteal vein 05/05/2012   Past Medical History:  Diagnosis Date   CAD (coronary artery disease)    Diabetes mellitus, type II (HCC) 02/2013   Diabetic ulcer of right foot due to type 2 diabetes mellitus (HCC) 03/21/2018   DVT (deep venous thrombosis) (HCC) 05/04/2012   LLE   Exertional dyspnea 05/04/2012   isolated episode (05/05/2012)   Hepatic steatosis    History of chickenpox    Hypertension 03/21/2018   Obesity, Class III, BMI 40-49.9 (morbid obesity) (HCC) 03/21/2018   OSA on CPAP    Peripheral vascular disease    varicose veins   Pulmonary embolism (HCC) 05/04/2012   bilaterally   Pyelonephritis 04/2017   Small bowel obstruction (HCC) 09/06/2015   Varicose veins     Family History  Problem Relation Age of Onset   Diabetes Mother        died age 47- also bleeding ulcer   Hypertension Mother    Heart failure Father        in his 49s (patient was 16). day before surgery planned   Other Maternal Grandmother        states natural causes all grandparents   Colon cancer Neg Hx    Esophageal cancer Neg Hx    Inflammatory bowel disease Neg Hx    Liver disease Neg Hx    Pancreatic cancer Neg Hx    Rectal cancer Neg Hx    Stomach cancer Neg Hx     Past Surgical History:  Procedure Laterality Date   BOWEL RESECTION N/A 02/20/2021   Procedure: SMALL BOWEL RESECTION;  Surgeon: Stevie, Herlene Righter, MD;  Location: [facility] OR;  Service:  General;  Laterality: N/A;   CORONARY STENT INTERVENTION N/A 01/02/2021   Procedure: CORONARY STENT INTERVENTION;  Surgeon: Dann Candyce RAMAN, MD;  Location: [facility] INVAS[facility] CV LAB;  Service: Cardiovascular;  Laterality: N/A;   CYSTOSCOPY W/ URETERAL STENT PLACEMENT Right 05/12/2017   Procedure: CYSTOSCOPY WITH RETROGRADE PYELOGRAM/URETERAL  STENT PLACEMENT;  Surgeon: Watt Rush, MD;  Location: [facility] OR;  Service: Urology;  Laterality: Right;   CYSTOSCOPY/URETEROSCOPY/HOLMIUM LASER/STENT PLACEMENT Right 07/29/2017   Procedure: CYSTOSCOPY RIGHT URETEROSCOPY HOLMIUM LASER STENT EXCHANGE;  Surgeon: Watt Rush, MD;  Location: [facility];  Service: Urology;  Laterality: Right;   HERNIA REPAIR     2015   LAPAROTOMY N/A 12/13/2013   Procedure: EXPLORATORY LAPAROTOMY Repair ventral hernia, without mesh, partial omentectomy;  Surgeon: Lynwood MALVA Pina, MD;  Location: [facility] OR;  Service: General;  Laterality: N/A;   LEFT HEART CATH AND CORONARY ANGIOGRAPHY N/A 01/02/2021   Procedure: LEFT HEART CATH AND CORONARY ANGIOGRAPHY;  Surgeon: Dann Candyce RAMAN, MD;  Location: [facility] INVASIVE CV LAB;  Service: Cardiovascular;  Laterality: N/A;   LYSIS OF ADHESION  02/20/2021   Procedure: LYSIS OF ADHESION;  Surgeon: Stevie, Herlene Righter, MD;  Location: [facility] OR;  Service: General;;   VENTRAL HERNIA REPAIR N/A 04/26/2017   Procedure: INCARCERATED HERNIA REPAIR VENTRAL ADULT;  Surgeon: Sebastian Moles, MD;  Location: [facility] OR;  Service: General;  Laterality: N/A;   VENTRAL HERNIA REPAIR N/A 02/20/2021   Procedure: EXPLORATORY LAPAROTOMY; RECURRENT HERNIA REPAIR WITH OVERLAY MESH;  Surgeon: Kinsinger, Herlene Righter, MD;  Location: [facility] OR;  Service: General;  Laterality: N/A;   Social History   Occupational History   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

## 2024-05-27 ENCOUNTER — Other Ambulatory Visit: Payer: Self-pay | Admitting: Family Medicine

## 2024-05-31 ENCOUNTER — Encounter: Payer: Self-pay | Admitting: Physician Assistant

## 2024-05-31 ENCOUNTER — Ambulatory Visit (INDEPENDENT_AMBULATORY_CARE_PROVIDER_SITE_OTHER): Payer: PRIVATE HEALTH INSURANCE | Admitting: Physician Assistant

## 2024-05-31 ENCOUNTER — Encounter: Payer: PRIVATE HEALTH INSURANCE | Admitting: Family Medicine

## 2024-05-31 DIAGNOSIS — I87331 Chronic venous hypertension (idiopathic) with ulcer and inflammation of right lower extremity: Secondary | ICD-10-CM | POA: Diagnosis not present

## 2024-05-31 NOTE — Progress Notes (Signed)
 "  Office Visit Note   [name]           Date of Birth: September 16, 1960           MRN: [id number] Visit Date: 05/31/2024              Requested by: Katrinka Garnette KIDD, MD [address] [place],  [place] [address] PCP: Katrinka Garnette KIDD, MD  Chief Complaint  Patient presents with   Left Leg - Wound Check      HPI: Evan Leonard is a 64 year old male who presents with a non-healing wound on his calf and an ankle ulcer.    Patient is not a candidate for the Kerecis study due to the inclusion criteria requiring BMI to less than 40.  We have been using compression dressings weekly.    Assessment & Plan: Visit Diagnoses:  1. Chronic venous hypertension (idiopathic) with ulcer and inflammation of right lower extremity (HCC)     Plan: re applied the Dynaflex wrap for compression.  Elevation when at rest.  F/U in 1 week.  The wounds are healthy without cellulitis or active bleeding or drainage.    Follow-Up Instructions: Return in about 1 week (around 06/07/2024).   Ortho Exam  Patient is alert, oriented, no adenopathy, well-dressed, normal affect, normal respiratory effort. There is 100 % granulation tissue in the wound beds. Palpable DP pulse with improved edema.   Good skin lines with decreased edema with the help of the compression wrap.  Proximal wound is 2 cm x 4 cm, the distal wound in 1.5 cm diameter.  No cellulitis.      Imaging:        Labs: Lab Results  Component Value Date   HGBA1C 9.6 (H) 03/25/2024   HGBA1C 8.1 (H) 05/26/2023   HGBA1C 7.3 (H) 01/06/2023   ESRSEDRATE 21 (H) 01/01/2021   ESRSEDRATE 28 (H) 03/21/2018   CRP 0.7 01/01/2021   CRP <0.8 03/21/2018   REPTSTATUS 03/01/2024 FINAL 02/28/2024   GRAMSTAIN  03/21/2018    NO WBC SEEN NO ORGANISMS SEEN Performed at [facility], [address], [place], [place] [address]    CULT >=100,000 COLONIES/mL SERRATIA MARCESCENS (A) 02/28/2024   LABORGA SERRATIA MARCESCENS (A)  02/28/2024     Lab Results  Component Value Date   ALBUMIN 4.0 03/25/2024   ALBUMIN 4.0 05/26/2023   ALBUMIN 3.9 01/06/2023   PREALBUMIN 15.8 (L) 03/21/2018    Lab Results  Component Value Date   MG 1.7 02/24/2021   MG 2.1 06/20/2015   MG 1.7 06/16/2015   Lab Results  Component Value Date   VD25OH 24.46 (L) 05/26/2023    Lab Results  Component Value Date   PREALBUMIN 15.8 (L) 03/21/2018      Latest Ref Rng & Units 04/14/2024    3:17 PM 03/25/2024    3:52 PM 09/04/2023    6:59 AM  CBC EXTENDED  WBC 4.0 - 10.5 K/uL 9.2  9.4  8.3   RBC 4.22 - 5.81 Mil/uL 4.36  4.44  4.94   Hemoglobin 13.0 - 17.0 g/dL 88.0  87.9  86.1   HCT 39.0 - 52.0 % 36.4  37.1  43.3   Platelets 150.0 - 400.0 K/uL 234.0  213.0  178   NEUT# 1.4 - 7.7 K/uL 5.5  5.0    Lymph# 0.7 - 4.0 K/uL 2.6  2.9       There is no height or weight on  file to calculate BMI.  Orders:  No orders of the defined types were placed in this encounter.  No orders of the defined types were placed in this encounter.    Procedures: No procedures performed  Clinical Data: No additional findings.  ROS:  All other systems negative, except as noted in the HPI. Review of Systems  Objective: Vital Signs: There were no vitals taken for this visit.  Specialty Comments:  No specialty comments available.  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 acute cor pulmonale (HCC) 06/05/2021   Pressure injury of skin 02/25/2021   Chronic anticoagulation 02/19/2021   CAD (coronary artery disease) 01/09/2021   Eczema 12/27/2019   Erectile dysfunction 12/27/2019   History of  small bowel obstruction 08/20/2018   History of diabetic ulcer of foot 03/21/2018   Non-cardiac chest pain 02/09/2018   Renal stone 05/12/2017   S/P repair of recurrent ventral hernia 04/26/2017   History of pulmonary embolism 07/16/2016   Chronic venous insufficiency 01/25/2015   Incarcerated ventral hernia 12/13/2013   Diabetes mellitus with hyperglycemia (HCC) 03/31/2013   Leukocytosis 03/23/2013   Hyponatremia 03/23/2013   Morbid obesity with body mass index of 40.0-44.9 in adult [facility]) 05/05/2012   Obstructive sleep apnea 05/05/2012   DVT of left distal popliteal vein 05/05/2012   Past Medical History:  Diagnosis Date   CAD (coronary artery disease)    Diabetes mellitus, type II (HCC) 02/2013   Diabetic ulcer of right foot due to type 2 diabetes mellitus (HCC) 03/21/2018   DVT (deep venous thrombosis) (HCC) 05/04/2012   LLE   Exertional dyspnea 05/04/2012   isolated episode (05/05/2012)   Hepatic steatosis    History of chickenpox    Hypertension 03/21/2018   Obesity, Class III, BMI 40-49.9 (morbid obesity) (HCC) 03/21/2018   OSA on CPAP    Peripheral vascular disease    varicose veins   Pulmonary embolism (HCC) 05/04/2012   bilaterally   Pyelonephritis 04/2017   Small bowel obstruction (HCC) 09/06/2015   Varicose veins     Family History  Problem Relation Age of Onset   Diabetes Mother        died age 58- also bleeding ulcer   Hypertension Mother    Heart failure Father        in his 32s (patient was 43). day before surgery planned   Other Maternal Grandmother        states natural causes all grandparents   Colon cancer Neg Hx    Esophageal cancer Neg Hx    Inflammatory bowel disease Neg Hx    Liver disease Neg Hx    Pancreatic cancer Neg Hx    Rectal cancer Neg Hx    Stomach cancer Neg Hx     Past Surgical History:  Procedure Laterality Date   BOWEL RESECTION N/A 02/20/2021   Procedure: SMALL BOWEL RESECTION;  Surgeon: Stevie, Herlene Righter, MD;   Location: [facility] OR;  Service: General;  Laterality: N/A;   CORONARY STENT INTERVENTION N/A 01/02/2021   Procedure: CORONARY STENT INTERVENTION;  Surgeon: Dann Candyce RAMAN, MD;  Location: [facility] INVASIVE CV LAB;  Service: Cardiovascular;  Laterality: N/A;   CYSTOSCOPY  W/ URETERAL STENT PLACEMENT Right 05/12/2017   Procedure: CYSTOSCOPY WITH RETROGRADE PYELOGRAM/URETERAL STENT PLACEMENT;  Surgeon: Watt Rush, MD;  Location: [facility] OR;  Service: Urology;  Laterality: Right;   CYSTOSCOPY/URETEROSCOPY/HOLMIUM LASER/STENT PLACEMENT Right 07/29/2017   Procedure: CYSTOSCOPY RIGHT URETEROSCOPY HOLMIUM LASER STENT EXCHANGE;  Surgeon: Watt Rush, MD;  Location: [facility] ORS;  Service: Urology;  Laterality: Right;   HERNIA REPAIR     2015   LAPAROTOMY N/A 12/13/2013   Procedure: EXPLORATORY LAPAROTOMY Repair ventral hernia, without mesh, partial omentectomy;  Surgeon: Lynwood MALVA Pina, MD;  Location: [facility] OR;  Service: General;  Laterality: N/A;   LEFT HEART CATH AND CORONARY ANGIOGRAPHY N/A 01/02/2021   Procedure: LEFT HEART CATH AND CORONARY ANGIOGRAPHY;  Surgeon: Dann Candyce RAMAN, MD;  Location: [facility] INVASIVE CV LAB;  Service: Cardiovascular;  Laterality: N/A;   LYSIS OF ADHESION  02/20/2021   Procedure: LYSIS OF ADHESION;  Surgeon: Stevie, Herlene Righter, MD;  Location: [facility] OR;  Service: General;;   VENTRAL HERNIA REPAIR N/A 04/26/2017   Procedure: INCARCERATED HERNIA REPAIR VENTRAL ADULT;  Surgeon: Sebastian Moles, MD;  Location: [facility] OR;  Service: General;  Laterality: N/A;   VENTRAL HERNIA REPAIR N/A 02/20/2021   Procedure: EXPLORATORY LAPAROTOMY; RECURRENT HERNIA REPAIR WITH OVERLAY MESH;  Surgeon: Kinsinger, Herlene Righter, MD;  Location: [facility] OR;  Service: General;  Laterality: N/A;   Social History   Occupational History   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

## 2024-06-07 ENCOUNTER — Ambulatory Visit: Payer: PRIVATE HEALTH INSURANCE | Admitting: Orthopedic Surgery

## 2024-06-07 ENCOUNTER — Encounter: Payer: Self-pay | Admitting: Orthopedic Surgery

## 2024-06-07 DIAGNOSIS — I87331 Chronic venous hypertension (idiopathic) with ulcer and inflammation of right lower extremity: Secondary | ICD-10-CM

## 2024-06-07 NOTE — Progress Notes (Signed)
 "  Office Visit Note   [name]           Date of Birth: April 28, 1961           MRN: [id number] Visit Date: 06/07/2024              Requested by: Katrinka Garnette KIDD, MD [address] [place],  [place] [address] PCP: Katrinka Garnette KIDD, MD  Chief Complaint  Patient presents with   Left Leg - Wound Check      HPI: Discussed the use of AI scribe software for clinical note transcription with the patient, who gave verbal consent to proceed.  History of Present Illness Evan Leonard is a 64 year old male with a chronic left lower leg ulcer who presents for follow-up evaluation.  The patient has a chronic wound on the left lower leg that has been managed with dressings and multilayer compression wraps. The wound has been managed with dressings and multilayer compression wraps.     Assessment & Plan: Visit Diagnoses:  1. Chronic venous hypertension (idiopathic) with ulcer and inflammation of right lower extremity (HCC)     Plan: Assessment and Plan Assessment & Plan Chronic ulcer of left lower leg Chronic ulcer, 2 x 4 cm, no tunneling or cellulitis. - Reapplied 4x4 gauze with Vashe and multilayer compression wrap. - Instructed him to continue limb elevation. - Scheduled follow-up in one week.      Follow-Up Instructions: Return in about 1 week (around 06/14/2024).   Ortho Exam  Patient is alert, oriented, no adenopathy, well-dressed, normal affect, normal respiratory effort. Physical Exam SKIN: Wound bed measures 2x4 cm, 1 cm diameter distally, no depth, no tunneling, no cellulitis.      Imaging: No results found. No images are attached to the encounter.  Labs: Lab Results  Component Value Date   HGBA1C 9.6 (H) 03/25/2024   HGBA1C 8.1 (H) 05/26/2023   HGBA1C 7.3 (H) 01/06/2023   ESRSEDRATE 21 (H) 01/01/2021   ESRSEDRATE 28 (H) 03/21/2018   CRP 0.7 01/01/2021   CRP <0.8 03/21/2018   REPTSTATUS 03/01/2024 FINAL 02/28/2024   GRAMSTAIN   03/21/2018    NO WBC SEEN NO ORGANISMS SEEN Performed at [facility], [address], [place], [place] [address]    CULT >=100,000 COLONIES/mL SERRATIA MARCESCENS (A) 02/28/2024   LABORGA SERRATIA MARCESCENS (A) 02/28/2024     Lab Results  Component Value Date   ALBUMIN 4.0 03/25/2024   ALBUMIN 4.0 05/26/2023   ALBUMIN 3.9 01/06/2023   PREALBUMIN 15.8 (L) 03/21/2018    Lab Results  Component Value Date   MG 1.7 02/24/2021   MG 2.1 06/20/2015   MG 1.7 06/16/2015   Lab Results  Component Value Date   VD25OH 24.46 (L) 05/26/2023    Lab Results  Component Value Date   PREALBUMIN 15.8 (L) 03/21/2018      Latest Ref Rng & Units 04/14/2024    3:17 PM 03/25/2024    3:52 PM 09/04/2023    6:59 AM  CBC EXTENDED  WBC 4.0 - 10.5 K/uL 9.2  9.4  8.3   RBC 4.22 - 5.81 Mil/uL 4.36  4.44  4.94   Hemoglobin 13.0 - 17.0 g/dL 88.0  87.9  86.1   HCT 39.0 - 52.0 % 36.4  37.1  43.3   Platelets 150.0 - 400.0 K/uL 234.0  213.0  178   NEUT# 1.4 - 7.7 K/uL 5.5  5.0    Lymph# 0.7 - 4.0  K/uL 2.6  2.9       There is no height or weight on file to calculate BMI.  Orders:  No orders of the defined types were placed in this encounter.  No orders of the defined types were placed in this encounter.    Procedures: No procedures performed  Clinical Data: No additional findings.  ROS:  All other systems negative, except as noted in the HPI. Review of Systems  Objective: Vital Signs: There were no vitals taken for this visit.  Specialty Comments:  No specialty comments available.  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  acute cor pulmonale (HCC) 06/05/2021   Pressure injury of skin 02/25/2021   Chronic anticoagulation 02/19/2021   CAD (coronary artery disease) 01/09/2021   Eczema 12/27/2019   Erectile dysfunction 12/27/2019   History of small bowel obstruction 08/20/2018   History of diabetic ulcer of foot 03/21/2018   Non-cardiac chest pain 02/09/2018   Renal stone 05/12/2017   S/P repair of recurrent ventral hernia 04/26/2017   History of pulmonary embolism 07/16/2016   Chronic venous insufficiency 01/25/2015   Incarcerated ventral hernia 12/13/2013   Diabetes mellitus with hyperglycemia (HCC) 03/31/2013   Leukocytosis 03/23/2013   Hyponatremia 03/23/2013   Morbid obesity with body mass index of 40.0-44.9 in adult [facility]) 05/05/2012   Obstructive sleep apnea 05/05/2012   DVT of left distal popliteal vein 05/05/2012   Past Medical History:  Diagnosis Date   CAD (coronary artery disease)    Diabetes mellitus, type II (HCC) 02/2013   Diabetic ulcer of right foot due to type 2 diabetes mellitus (HCC) 03/21/2018   DVT (deep venous thrombosis) (HCC) 05/04/2012   LLE   Exertional dyspnea 05/04/2012   isolated episode (05/05/2012)   Hepatic steatosis    History of chickenpox    Hypertension 03/21/2018   Obesity, Class III, BMI 40-49.9 (morbid obesity) (HCC) 03/21/2018   OSA on CPAP    Peripheral vascular disease    varicose veins   Pulmonary embolism (HCC) 05/04/2012   bilaterally   Pyelonephritis 04/2017   Small bowel obstruction (HCC) 09/06/2015   Varicose veins     Family History  Problem Relation Age of Onset   Diabetes Mother        died age 12- also bleeding ulcer   Hypertension Mother    Heart failure Father        in his 34s (patient was 16). day before surgery planned   Other Maternal Grandmother        states natural causes all grandparents   Colon cancer Neg Hx    Esophageal cancer Neg Hx    Inflammatory bowel disease Neg Hx    Liver disease Neg Hx    Pancreatic cancer  Neg Hx    Rectal cancer Neg Hx    Stomach cancer Neg Hx     Past Surgical History:  Procedure Laterality Date   BOWEL RESECTION N/A 02/20/2021   Procedure: SMALL BOWEL RESECTION;  Surgeon: Stevie, Herlene Righter, MD;  Location: [facility] OR;  Service: General;  Laterality: N/A;   CORONARY STENT INTERVENTION N/A 01/02/2021   Procedure: CORONARY STENT INTERVENTION;  Surgeon: Dann Beverage  S, MD;  Location: [facility] LAB;  Service: Cardiovascular;  Laterality: N/A;   CYSTOSCOPY W/ URETERAL STENT PLACEMENT Right 05/12/2017   Procedure: CYSTOSCOPY WITH RETROGRADE PYELOGRAM/URETERAL STENT PLACEMENT;  Surgeon: Watt Rush, MD;  Location: [facility] OR;  Service: Urology;  Laterality: Right;   CYSTOSCOPY/URETEROSCOPY/HOLMIUM LASER/STENT PLACEMENT Right 07/29/2017   Procedure: CYSTOSCOPY RIGHT URETEROSCOPY HOLMIUM LASER STENT EXCHANGE;  Surgeon: Watt Rush, MD;  Location: [facility];  Service: Urology;  Laterality: Right;   HERNIA REPAIR     2015   LAPAROTOMY N/A 12/13/2013   Procedure: EXPLORATORY LAPAROTOMY Repair ventral hernia, without mesh, partial omentectomy;  Surgeon: Lynwood MALVA Pina, MD;  Location: [facility] OR;  Service: General;  Laterality: N/A;   LEFT HEART CATH AND CORONARY ANGIOGRAPHY N/A 01/02/2021   Procedure: LEFT HEART CATH AND CORONARY ANGIOGRAPHY;  Surgeon: Dann Candyce RAMAN, MD;  Location: [facility] INVASIVE CV LAB;  Service: Cardiovascular;  Laterality: N/A;   LYSIS OF ADHESION  02/20/2021   Procedure: LYSIS OF ADHESION;  Surgeon: Stevie, Herlene Righter, MD;  Location: [facility];  Service: General;;   VENTRAL HERNIA REPAIR N/A 04/26/2017   Procedure: INCARCERATED HERNIA REPAIR VENTRAL ADULT;  Surgeon: Sebastian Moles, MD;  Location: [facility] OR;  Service: General;  Laterality: N/A;   VENTRAL HERNIA REPAIR N/A 02/20/2021   Procedure: EXPLORATORY LAPAROTOMY; RECURRENT HERNIA REPAIR WITH OVERLAY MESH;  Surgeon: Kinsinger, Herlene Righter, MD;  Location: [facility] OR;  Service: General;  Laterality: N/A;   Social History    Occupational History   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

## 2024-06-14 ENCOUNTER — Encounter: Payer: Self-pay | Admitting: Physician Assistant

## 2024-06-14 ENCOUNTER — Ambulatory Visit: Payer: PRIVATE HEALTH INSURANCE | Admitting: Physician Assistant

## 2024-06-14 DIAGNOSIS — I87331 Chronic venous hypertension (idiopathic) with ulcer and inflammation of right lower extremity: Secondary | ICD-10-CM

## 2024-06-14 NOTE — Progress Notes (Signed)
 "  Office Visit Note   [name]           Date of Birth: [birth year]           MRN: [id number] Visit Date: 06/14/2024              Requested by: Katrinka Garnette KIDD, MD [address] [place],  [place] [address] PCP: Katrinka Garnette KIDD, MD  Chief Complaint  Patient presents with   Left Leg - Follow-up      HPI: Evan Leonard is a 64 year old male with a chronic left lower leg ulcer who presents for follow-up evaluation.  We have been treating the venous ulcer with multilayer compression wrap.  He does work full time, so elevation is primarily done after work.    Assessment & Plan: Visit Diagnoses:  1. Chronic venous hypertension (idiopathic) with ulcer and inflammation of right lower extremity (HCC)     Plan: Chronic venous right LE leg ulcer re applied the Dynaflex wrap for compression. Elevation when at rest. F/U in 1 week.   Follow-Up Instructions: Return in about 1 week (around 06/21/2024).   Ortho Exam  Patient is alert, oriented, no adenopathy, well-dressed, normal affect, normal respiratory effort. 100 % granulation tissue both wound bases.  No surrounding cellulitis.  Good skin lines with significant decrease in edema after compression wrap removal.  Palpable DP pulse.  Proximal wound 4.5 cm x 3 cm, distal wound 1.5 cm x 1.3 cm.    Imaging:          Labs: Lab Results  Component Value Date   HGBA1C 9.6 (H) 03/25/2024   HGBA1C 8.1 (H) 05/26/2023   HGBA1C 7.3 (H) 01/06/2023   ESRSEDRATE 21 (H) 01/01/2021   ESRSEDRATE 28 (H) 03/21/2018   CRP 0.7 01/01/2021   CRP <0.8 03/21/2018   REPTSTATUS 03/01/2024 FINAL 02/28/2024   GRAMSTAIN  03/21/2018    NO WBC SEEN NO ORGANISMS SEEN Performed at [facility], [address], [place], [place] [address]    CULT >=100,000 COLONIES/mL SERRATIA MARCESCENS (A) 02/28/2024   LABORGA SERRATIA MARCESCENS (A) 02/28/2024     Lab Results  Component Value Date   ALBUMIN 4.0 03/25/2024    ALBUMIN 4.0 05/26/2023   ALBUMIN 3.9 01/06/2023   PREALBUMIN 15.8 (L) 03/21/2018    Lab Results  Component Value Date   MG 1.7 02/24/2021   MG 2.1 06/20/2015   MG 1.7 06/16/2015   Lab Results  Component Value Date   VD25OH 24.46 (L) 05/26/2023    Lab Results  Component Value Date   PREALBUMIN 15.8 (L) 03/21/2018      Latest Ref Rng & Units 04/14/2024    3:17 PM 03/25/2024    3:52 PM 09/04/2023    6:59 AM  CBC EXTENDED  WBC 4.0 - 10.5 K/uL 9.2  9.4  8.3   RBC 4.22 - 5.81 Mil/uL 4.36  4.44  4.94   Hemoglobin 13.0 - 17.0 g/dL 88.0  87.9  86.1   HCT 39.0 - 52.0 % 36.4  37.1  43.3   Platelets 150.0 - 400.0 K/uL 234.0  213.0  178   NEUT# 1.4 - 7.7 K/uL 5.5  5.0    Lymph# 0.7 - 4.0 K/uL 2.6  2.9       There is no height or weight on file to calculate BMI.  Orders:  No orders of the defined types were placed in this encounter.  No orders of the defined  types were placed in this encounter.    Procedures: No procedures performed  Clinical Data: No additional findings.  ROS:  All other systems negative, except as noted in the HPI. Review of Systems  Objective: Vital Signs: There were no vitals taken for this visit.  Specialty Comments:  No specialty comments available.  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 acute cor pulmonale (HCC) 06/05/2021   Pressure injury of skin 02/25/2021   Chronic anticoagulation 02/19/2021   CAD (coronary artery disease) 01/09/2021   Eczema 12/27/2019   Erectile dysfunction 12/27/2019   History of small bowel obstruction 08/20/2018   History of diabetic ulcer of foot 03/21/2018    Non-cardiac chest pain 02/09/2018   Renal stone 05/12/2017   S/P repair of recurrent ventral hernia 04/26/2017   History of pulmonary embolism 07/16/2016   Chronic venous insufficiency 01/25/2015   Incarcerated ventral hernia 12/13/2013   Diabetes mellitus with hyperglycemia (HCC) 03/31/2013   Leukocytosis 03/23/2013   Hyponatremia 03/23/2013   Morbid obesity with body mass index of 40.0-44.9 in adult [facility]) 05/05/2012   Obstructive sleep apnea 05/05/2012   DVT of left distal popliteal vein 05/05/2012   Past Medical History:  Diagnosis Date   CAD (coronary artery disease)    Diabetes mellitus, type II (HCC) 02/2013   Diabetic ulcer of right foot due to type 2 diabetes mellitus (HCC) 03/21/2018   DVT (deep venous thrombosis) (HCC) 05/04/2012   LLE   Exertional dyspnea 05/04/2012   isolated episode (05/05/2012)   Hepatic steatosis    History of chickenpox    Hypertension 03/21/2018   Obesity, Class III, BMI 40-49.9 (morbid obesity) (HCC) 03/21/2018   OSA on CPAP    Peripheral vascular disease    varicose veins   Pulmonary embolism (HCC) 05/04/2012   bilaterally   Pyelonephritis 04/2017   Small bowel obstruction (HCC) 09/06/2015   Varicose veins     Family History  Problem Relation Age of Onset   Diabetes Mother        died age 55- also bleeding ulcer   Hypertension Mother    Heart failure Father        in his 32s (patient was 65). day before surgery planned   Other Maternal Grandmother        states natural causes all grandparents   Colon cancer Neg Hx    Esophageal cancer Neg Hx    Inflammatory bowel disease Neg Hx    Liver disease Neg Hx    Pancreatic cancer Neg Hx    Rectal cancer Neg Hx    Stomach cancer Neg Hx     Past Surgical History:  Procedure Laterality Date   BOWEL RESECTION N/A 02/20/2021   Procedure: SMALL BOWEL RESECTION;  Surgeon: Stevie, Herlene Righter, MD;  Location: [facility] OR;  Service: General;  Laterality: N/A;   CORONARY STENT INTERVENTION N/A  01/02/2021   Procedure: CORONARY STENT INTERVENTION;  Surgeon: Dann Candyce RAMAN, MD;  Location: [facility] INVASIVE CV LAB;  Service: Cardiovascular;  Laterality: N/A;   CYSTOSCOPY W/ URETERAL STENT PLACEMENT Right 05/12/2017   Procedure: CYSTOSCOPY WITH RETROGRADE PYELOGRAM/URETERAL STENT PLACEMENT;  Surgeon: Watt Rush, MD;  Location: [facility] OR;  Service: Urology;  Laterality: Right;   CYSTOSCOPY/URETEROSCOPY/HOLMIUM LASER/STENT PLACEMENT Right 07/29/2017   Procedure: CYSTOSCOPY RIGHT URETEROSCOPY HOLMIUM LASER STENT EXCHANGE;  Surgeon: Watt Rush, MD;  Location: [facility] OR[facility];  Service: Urology;  Laterality: Right;   HERNIA REPAIR     2015   LAPAROTOMY N/A 12/13/2013   Procedure: EXPLORATORY LAPAROTOMY Repair ventral hernia, without mesh, partial omentectomy;  Surgeon: Lynwood MALVA Pina, MD;  Location: [facility] OR;  Service: General;  Laterality: N/A;   LEFT HEART CATH AND CORONARY ANGIOGRAPHY N/A 01/02/2021   Procedure: LEFT HEART CATH AND CORONARY ANGIOGRAPHY;  Surgeon: Dann Candyce RAMAN, MD;  Location: [facility] INVASIVE CV LAB;  Service: Cardiovascular;  Laterality: N/A;   LYSIS OF ADHESION  02/20/2021   Procedure: LYSIS OF ADHESION;  Surgeon: Stevie, Herlene Righter, MD;  Location: [facility];  Service: General;;   VENTRAL HERNIA REPAIR N/A 04/26/2017   Procedure: INCARCERATED HERNIA REPAIR VENTRAL ADULT;  Surgeon: Sebastian Moles, MD;  Location: [facility];  Service: General;  Laterality: N/A;   VENTRAL HERNIA REPAIR N/A 02/20/2021   Procedure: EXPLORATORY LAPAROTOMY; RECURRENT HERNIA REPAIR WITH OVERLAY MESH;  Surgeon: Kinsinger, Herlene Righter, MD;  Location: [facility];  Service: General;  Laterality: N/A;   Social History   Occupational History   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

## 2024-06-15 ENCOUNTER — Other Ambulatory Visit: Payer: Self-pay | Admitting: Family Medicine

## 2024-06-28 ENCOUNTER — Ambulatory Visit: Payer: PRIVATE HEALTH INSURANCE | Admitting: Physician Assistant

## 2024-07-01 ENCOUNTER — Ambulatory Visit: Payer: PRIVATE HEALTH INSURANCE | Admitting: Physician Assistant

## 2024-07-01 ENCOUNTER — Encounter: Payer: Self-pay | Admitting: Physician Assistant

## 2024-07-01 DIAGNOSIS — I87331 Chronic venous hypertension (idiopathic) with ulcer and inflammation of right lower extremity: Secondary | ICD-10-CM

## 2024-07-01 NOTE — Progress Notes (Signed)
 "  Office Visit Note   [name]           Date of Birth: 1961-05-11           MRN: [id number] Visit Date: 07/01/2024              Requested by: Katrinka Garnette KIDD, MD [address] [place],  [place] [address] PCP: Katrinka Garnette KIDD, MD  Chief Complaint  Patient presents with   Left Leg - Wound Check      HPI: Carney Saxton is a 64 year old male with a chronic left lower leg ulcer who presents for follow-up evaluation.  We have been treating the venous ulcer with multilayer compression wrap.  He does work full time, so elevation is primarily done after work.   Assessment & Plan: Visit Diagnoses:  1. Chronic venous hypertension (idiopathic) with ulcer and inflammation of right lower extremity (HCC)     Plan: Right LE venous ulcer Chronic venous right LE leg ulcer re applied the Dynaflex wrap for compression. Elevation when at rest. F/U in 1 week.  We will discuss compression socks on his next visit.  He does not have much edema in the left LE.   He will elevate when he is able and not working during the day.    Follow-Up Instructions: Return in about 1 week (around 07/08/2024).   Ortho Exam  Patient is alert, oriented, no adenopathy, well-dressed, normal affect, normal respiratory effort. The wound measures 4.5 cm x 2 cm.  There is 100% granulation tissue with a skin island in the lateral/middle of the wound.  No surrounding cellulitis.  Moderate edema.  No other open wounds.  He has a palpable DP pulse.          Labs: Lab Results  Component Value Date   HGBA1C 9.6 (H) 03/25/2024   HGBA1C 8.1 (H) 05/26/2023   HGBA1C 7.3 (H) 01/06/2023   ESRSEDRATE 21 (H) 01/01/2021   ESRSEDRATE 28 (H) 03/21/2018   CRP 0.7 01/01/2021   CRP <0.8 03/21/2018   REPTSTATUS 03/01/2024 FINAL 02/28/2024   GRAMSTAIN  03/21/2018    NO WBC SEEN NO ORGANISMS SEEN Performed at [facility], [address], [place], [place] [address]    CULT >=100,000  COLONIES/mL SERRATIA MARCESCENS (A) 02/28/2024   LABORGA SERRATIA MARCESCENS (A) 02/28/2024     Lab Results  Component Value Date   ALBUMIN 4.0 03/25/2024   ALBUMIN 4.0 05/26/2023   ALBUMIN 3.9 01/06/2023   PREALBUMIN 15.8 (L) 03/21/2018    Lab Results  Component Value Date   MG 1.7 02/24/2021   MG 2.1 06/20/2015   MG 1.7 06/16/2015   Lab Results  Component Value Date   VD25OH 24.46 (L) 05/26/2023    Lab Results  Component Value Date   PREALBUMIN 15.8 (L) 03/21/2018      Latest Ref Rng & Units 04/14/2024    3:17 PM 03/25/2024    3:52 PM 09/04/2023    6:59 AM  CBC EXTENDED  WBC 4.0 - 10.5 K/uL 9.2  9.4  8.3   RBC 4.22 - 5.81 Mil/uL 4.36  4.44  4.94   Hemoglobin 13.0 - 17.0 g/dL 88.0  87.9  86.1   HCT 39.0 - 52.0 % 36.4  37.1  43.3   Platelets 150.0 - 400.0 K/uL 234.0  213.0  178   NEUT# 1.4 - 7.7 K/uL 5.5  5.0    Lymph# 0.7 - 4.0 K/uL 2.6  2.9  There is no height or weight on file to calculate BMI.  Orders:  No orders of the defined types were placed in this encounter.  No orders of the defined types were placed in this encounter.    Procedures: No procedures performed  Clinical Data: No additional findings.  ROS:  All other systems negative, except as noted in the HPI. Review of Systems  Objective: Vital Signs: There were no vitals taken for this visit.  Specialty Comments:  No specialty comments available.  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 acute cor pulmonale (HCC) 06/05/2021   Pressure injury of skin 02/25/2021   Chronic anticoagulation 02/19/2021   CAD (coronary artery disease)  01/09/2021   Eczema 12/27/2019   Erectile dysfunction 12/27/2019   History of small bowel obstruction 08/20/2018   History of diabetic ulcer of foot 03/21/2018   Non-cardiac chest pain 02/09/2018   Renal stone 05/12/2017   S/P repair of recurrent ventral hernia 04/26/2017   History of pulmonary embolism 07/16/2016   Chronic venous insufficiency 01/25/2015   Incarcerated ventral hernia 12/13/2013   Diabetes mellitus with hyperglycemia (HCC) 03/31/2013   Leukocytosis 03/23/2013   Hyponatremia 03/23/2013   Morbid obesity with body mass index of 40.0-44.9 in adult [facility]) 05/05/2012   Obstructive sleep apnea 05/05/2012   DVT of left distal popliteal vein 05/05/2012   Past Medical History:  Diagnosis Date   CAD (coronary artery disease)    Diabetes mellitus, type II (HCC) 02/2013   Diabetic ulcer of right foot due to type 2 diabetes mellitus (HCC) 03/21/2018   DVT (deep venous thrombosis) (HCC) 05/04/2012   LLE   Exertional dyspnea 05/04/2012   isolated episode (05/05/2012)   Hepatic steatosis    History of chickenpox    Hypertension 03/21/2018   Obesity, Class III, BMI 40-49.9 (morbid obesity) (HCC) 03/21/2018   OSA on CPAP    Peripheral vascular disease    varicose veins   Pulmonary embolism (HCC) 05/04/2012   bilaterally   Pyelonephritis 04/2017   Small bowel obstruction (HCC) 09/06/2015   Varicose veins     Family History  Problem Relation Age of Onset   Diabetes Mother        died age 79- also bleeding ulcer   Hypertension Mother    Heart failure Father        in his 49s (patient was 35). day before surgery planned   Other Maternal Grandmother        states natural causes all grandparents   Colon cancer Neg Hx    Esophageal cancer Neg Hx    Inflammatory bowel disease Neg Hx    Liver disease Neg Hx    Pancreatic cancer Neg Hx    Rectal cancer Neg Hx    Stomach cancer Neg Hx     Past Surgical History:  Procedure Laterality Date   BOWEL RESECTION N/A 02/20/2021    Procedure: SMALL BOWEL RESECTION;  Surgeon: Stevie, Herlene Righter, MD;  [facility];  Service: General;  Laterality: N/A;   CORONARY STENT INTERVENTION N/A 01/02/2021   Procedure: CORONARY STENT INTERVENTION;  Surgeon: Dann Candyce RAMAN, MD;  [facility]: [facility];  Service:  Cardiovascular;  Laterality: N/A;   CYSTOSCOPY W/ URETERAL STENT PLACEMENT Right 05/12/2017   Procedure: CYSTOSCOPY WITH RETROGRADE PYELOGRAM/URETERAL STENT PLACEMENT;  Surgeon: Watt Rush, MD;  Location: [facility] OR;  Service: Urology;  Laterality: Right;   CYSTOSCOPY/URETEROSCOPY/HOLMIUM LASER/STENT PLACEMENT Right 07/29/2017   Procedure: CYSTOSCOPY RIGHT URETEROSCOPY HOLMIUM LASER STENT EXCHANGE;  Surgeon: Watt Rush, MD;  Location: [facility];  Service: Urology;  Laterality: Right;   HERNIA REPAIR     2015   LAPAROTOMY N/A 12/13/2013   Procedure: EXPLORATORY LAPAROTOMY Repair ventral hernia, without mesh, partial omentectomy;  Surgeon: Lynwood MALVA Pina, MD;  Location: [facility];  Service: General;  Laterality: N/A;   LEFT HEART CATH AND CORONARY ANGIOGRAPHY N/A 01/02/2021   Procedure: LEFT HEART CATH AND CORONARY ANGIOGRAPHY;  Surgeon: Dann Candyce RAMAN, MD;  Location: [facility] INVASIVE CV LAB;  Service: Cardiovascular;  Laterality: N/A;   LYSIS OF ADHESION  02/20/2021   Procedure: LYSIS OF ADHESION;  Surgeon: Stevie, Herlene Righter, MD;  Location: [facility];  Service: General;;   VENTRAL HERNIA REPAIR N/A 04/26/2017   Procedure: INCARCERATED HERNIA REPAIR VENTRAL ADULT;  Surgeon: Sebastian Moles, MD;  Location: [facility] OR;  Service: General;  Laterality: N/A;   VENTRAL HERNIA REPAIR N/A 02/20/2021   Procedure: EXPLORATORY LAPAROTOMY; RECURRENT HERNIA REPAIR WITH OVERLAY MESH;  Surgeon: Kinsinger, Herlene Righter, MD;  Location: [facility] OR;  Service: General;  Laterality: N/A;   Social History   Occupational History   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

## 2024-09-24 ENCOUNTER — Encounter: Payer: PRIVATE HEALTH INSURANCE | Admitting: Family Medicine
